# Patient Record
Sex: Female | Born: 1950 | Race: White | Hispanic: No | Smoking: Former smoker
Health system: Southern US, Community
[De-identification: ages and names within clinical notes are randomized; demographics above are authoritative.]

## PROBLEM LIST (undated history)

## (undated) DIAGNOSIS — I4891 Unspecified atrial fibrillation: Secondary | ICD-10-CM

## (undated) DIAGNOSIS — F419 Anxiety disorder, unspecified: Secondary | ICD-10-CM

## (undated) DIAGNOSIS — E0590 Thyrotoxicosis, unspecified without thyrotoxic crisis or storm: Secondary | ICD-10-CM

## (undated) DIAGNOSIS — Z8489 Family history of other specified conditions: Secondary | ICD-10-CM

## (undated) DIAGNOSIS — I10 Essential (primary) hypertension: Secondary | ICD-10-CM

## (undated) DIAGNOSIS — I739 Peripheral vascular disease, unspecified: Secondary | ICD-10-CM

## (undated) DIAGNOSIS — M069 Rheumatoid arthritis, unspecified: Secondary | ICD-10-CM

## (undated) HISTORY — DX: Anxiety disorder, unspecified: F41.9

## (undated) HISTORY — PX: CATARACT EXTRACTION: SUR2

## (undated) HISTORY — DX: Thyrotoxicosis, unspecified without thyrotoxic crisis or storm: E05.90

## (undated) HISTORY — DX: Peripheral vascular disease, unspecified: I73.9

## (undated) HISTORY — DX: Unspecified atrial fibrillation: I48.91

## (undated) HISTORY — PX: TUBAL LIGATION: SHX77

## (undated) HISTORY — PX: OTHER SURGICAL HISTORY: SHX169

## (undated) HISTORY — DX: Rheumatoid arthritis, unspecified: M06.9

## (undated) HISTORY — DX: Essential (primary) hypertension: I10

---

## 1997-10-24 ENCOUNTER — Emergency Department (HOSPITAL_COMMUNITY): Admission: EM | Admit: 1997-10-24 | Discharge: 1997-10-24 | Payer: Self-pay | Admitting: Emergency Medicine

## 1998-03-23 ENCOUNTER — Encounter: Admission: RE | Admit: 1998-03-23 | Discharge: 1998-06-21 | Payer: Self-pay | Admitting: Neurosurgery

## 2002-05-31 ENCOUNTER — Emergency Department (HOSPITAL_COMMUNITY): Admission: EM | Admit: 2002-05-31 | Discharge: 2002-05-31 | Payer: Self-pay | Admitting: Emergency Medicine

## 2002-10-15 ENCOUNTER — Emergency Department (HOSPITAL_COMMUNITY): Admission: EM | Admit: 2002-10-15 | Discharge: 2002-10-15 | Payer: Self-pay | Admitting: Emergency Medicine

## 2002-10-15 ENCOUNTER — Encounter: Payer: Self-pay | Admitting: Emergency Medicine

## 2002-10-19 ENCOUNTER — Ambulatory Visit (HOSPITAL_BASED_OUTPATIENT_CLINIC_OR_DEPARTMENT_OTHER): Admission: RE | Admit: 2002-10-19 | Discharge: 2002-10-19 | Payer: Self-pay | Admitting: Orthopedic Surgery

## 2004-08-20 ENCOUNTER — Emergency Department (HOSPITAL_COMMUNITY): Admission: EM | Admit: 2004-08-20 | Discharge: 2004-08-20 | Payer: Self-pay | Admitting: Emergency Medicine

## 2004-08-25 ENCOUNTER — Ambulatory Visit: Admission: RE | Admit: 2004-08-25 | Discharge: 2004-08-25 | Payer: Self-pay | Admitting: Emergency Medicine

## 2004-08-30 ENCOUNTER — Ambulatory Visit: Payer: Self-pay | Admitting: Family Medicine

## 2004-09-02 ENCOUNTER — Ambulatory Visit: Payer: Self-pay | Admitting: *Deleted

## 2004-09-04 ENCOUNTER — Ambulatory Visit: Payer: Self-pay | Admitting: Family Medicine

## 2004-09-13 ENCOUNTER — Ambulatory Visit: Payer: Self-pay | Admitting: Family Medicine

## 2004-09-18 ENCOUNTER — Ambulatory Visit (HOSPITAL_COMMUNITY): Admission: RE | Admit: 2004-09-18 | Discharge: 2004-09-18 | Payer: Self-pay | Admitting: Internal Medicine

## 2004-10-09 ENCOUNTER — Ambulatory Visit: Payer: Self-pay | Admitting: Family Medicine

## 2004-10-28 ENCOUNTER — Ambulatory Visit: Payer: Self-pay | Admitting: Gastroenterology

## 2005-02-19 ENCOUNTER — Ambulatory Visit: Payer: Self-pay | Admitting: Internal Medicine

## 2005-03-04 ENCOUNTER — Ambulatory Visit: Payer: Self-pay | Admitting: Family Medicine

## 2005-05-07 ENCOUNTER — Ambulatory Visit (HOSPITAL_COMMUNITY): Admission: RE | Admit: 2005-05-07 | Discharge: 2005-05-07 | Payer: Self-pay | Admitting: Internal Medicine

## 2005-05-07 ENCOUNTER — Ambulatory Visit: Payer: Self-pay | Admitting: Family Medicine

## 2005-05-21 ENCOUNTER — Ambulatory Visit: Payer: Self-pay | Admitting: Family Medicine

## 2005-06-10 ENCOUNTER — Ambulatory Visit: Payer: Self-pay | Admitting: Family Medicine

## 2005-07-31 ENCOUNTER — Ambulatory Visit: Payer: Self-pay | Admitting: Family Medicine

## 2005-09-22 ENCOUNTER — Ambulatory Visit: Payer: Self-pay | Admitting: Family Medicine

## 2006-04-29 ENCOUNTER — Ambulatory Visit: Payer: Self-pay | Admitting: Family Medicine

## 2006-08-13 ENCOUNTER — Emergency Department (HOSPITAL_COMMUNITY): Admission: EM | Admit: 2006-08-13 | Discharge: 2006-08-13 | Payer: Self-pay | Admitting: Emergency Medicine

## 2006-08-26 ENCOUNTER — Ambulatory Visit: Payer: Self-pay | Admitting: Family Medicine

## 2006-08-26 ENCOUNTER — Encounter (INDEPENDENT_AMBULATORY_CARE_PROVIDER_SITE_OTHER): Payer: Self-pay | Admitting: Family Medicine

## 2006-09-23 ENCOUNTER — Ambulatory Visit: Payer: Self-pay | Admitting: Family Medicine

## 2006-11-16 ENCOUNTER — Ambulatory Visit: Payer: Self-pay | Admitting: Family Medicine

## 2007-03-31 ENCOUNTER — Encounter (INDEPENDENT_AMBULATORY_CARE_PROVIDER_SITE_OTHER): Payer: Self-pay | Admitting: *Deleted

## 2008-05-03 ENCOUNTER — Ambulatory Visit: Payer: Self-pay | Admitting: Internal Medicine

## 2008-05-03 LAB — CONVERTED CEMR LAB
ALT: 15 units/L (ref 0–35)
AST: 15 units/L (ref 0–37)
Albumin: 4.4 g/dL (ref 3.5–5.2)
Alkaline Phosphatase: 113 units/L (ref 39–117)
BUN: 15 mg/dL (ref 6–23)
Basophils Absolute: 0.1 10*3/uL (ref 0.0–0.1)
Basophils Relative: 0 % (ref 0–1)
CO2: 24 meq/L (ref 19–32)
Calcium: 10.8 mg/dL — ABNORMAL HIGH (ref 8.4–10.5)
Chloride: 104 meq/L (ref 96–112)
Creatinine, Ser: 0.68 mg/dL (ref 0.40–1.20)
Eosinophils Absolute: 0.1 10*3/uL (ref 0.0–0.7)
Eosinophils Relative: 1 % (ref 0–5)
Glucose, Bld: 107 mg/dL — ABNORMAL HIGH (ref 70–99)
HCT: 40.6 % (ref 36.0–46.0)
Hemoglobin: 13.2 g/dL (ref 12.0–15.0)
Lymphocytes Relative: 7 % — ABNORMAL LOW (ref 12–46)
Lymphs Abs: 1 10*3/uL (ref 0.7–4.0)
MCHC: 32.5 g/dL (ref 30.0–36.0)
MCV: 98.1 fL (ref 78.0–100.0)
Monocytes Absolute: 0.4 10*3/uL (ref 0.1–1.0)
Monocytes Relative: 3 % (ref 3–12)
Neutro Abs: 11.9 10*3/uL — ABNORMAL HIGH (ref 1.7–7.7)
Neutrophils Relative %: 89 % — ABNORMAL HIGH (ref 43–77)
Platelets: 286 10*3/uL (ref 150–400)
Potassium: 4.2 meq/L (ref 3.5–5.3)
RBC: 4.14 M/uL (ref 3.87–5.11)
RDW: 14.2 % (ref 11.5–15.5)
Sed Rate: 35 mm/hr — ABNORMAL HIGH (ref 0–22)
Sodium: 139 meq/L (ref 135–145)
Total Bilirubin: 0.4 mg/dL (ref 0.3–1.2)
Total Protein: 7.6 g/dL (ref 6.0–8.3)
WBC: 13.4 10*3/uL — ABNORMAL HIGH (ref 4.0–10.5)

## 2008-05-15 ENCOUNTER — Ambulatory Visit (HOSPITAL_COMMUNITY): Admission: RE | Admit: 2008-05-15 | Discharge: 2008-05-15 | Payer: Self-pay | Admitting: Family Medicine

## 2008-09-20 ENCOUNTER — Ambulatory Visit: Payer: Self-pay | Admitting: Internal Medicine

## 2008-09-20 LAB — CONVERTED CEMR LAB
ALT: 10 units/L (ref 0–35)
AST: 11 units/L (ref 0–37)
Alkaline Phosphatase: 76 units/L (ref 39–117)
Band Neutrophils: 0 % (ref 0–10)
Basophils Absolute: 0 10*3/uL (ref 0.0–0.1)
Basophils Relative: 0 % (ref 0–1)
Calcium: 10.1 mg/dL (ref 8.4–10.5)
Creatinine, Ser: 0.81 mg/dL (ref 0.40–1.20)
Direct LDL: 117 mg/dL — ABNORMAL HIGH
Eosinophils Absolute: 0.1 10*3/uL (ref 0.0–0.7)
Eosinophils Relative: 1 % (ref 0–5)
Glucose, Bld: 100 mg/dL — ABNORMAL HIGH (ref 70–99)
Hemoglobin: 12.7 g/dL (ref 12.0–15.0)
Lymphocytes Relative: 25 % (ref 12–46)
Monocytes Absolute: 0.8 10*3/uL (ref 0.1–1.0)
Monocytes Relative: 8 % (ref 3–12)
Neutro Abs: 6.7 10*3/uL (ref 1.7–7.7)
Neutrophils Relative %: 65 % (ref 43–77)
Platelets: 312 10*3/uL (ref 150–400)
Potassium: 4.3 meq/L (ref 3.5–5.3)
RBC: 3.8 M/uL — ABNORMAL LOW (ref 3.87–5.11)
RDW: 14 % (ref 11.5–15.5)
Sodium: 141 meq/L (ref 135–145)
Total Bilirubin: 0.2 mg/dL — ABNORMAL LOW (ref 0.3–1.2)
Total Protein: 7.4 g/dL (ref 6.0–8.3)
WBC: 10.2 10*3/uL (ref 4.0–10.5)

## 2009-02-14 ENCOUNTER — Ambulatory Visit: Payer: Self-pay | Admitting: Internal Medicine

## 2009-03-28 ENCOUNTER — Ambulatory Visit: Payer: Self-pay | Admitting: Internal Medicine

## 2009-03-28 LAB — CONVERTED CEMR LAB
AST: 16 units/L (ref 0–37)
BUN: 15 mg/dL (ref 6–23)
Basophils Absolute: 0 10*3/uL (ref 0.0–0.1)
Basophils Relative: 0 % (ref 0–1)
Calcium: 10.5 mg/dL (ref 8.4–10.5)
Chloride: 104 meq/L (ref 96–112)
Creatinine, Ser: 0.81 mg/dL (ref 0.40–1.20)
Eosinophils Absolute: 0 10*3/uL (ref 0.0–0.7)
Eosinophils Relative: 0 % (ref 0–5)
HCT: 37.2 % (ref 36.0–46.0)
Lymphs Abs: 1.2 10*3/uL (ref 0.7–4.0)
MCHC: 33.6 g/dL (ref 30.0–36.0)
MCV: 100.8 fL — ABNORMAL HIGH (ref 78.0–100.0)
Platelets: 308 10*3/uL (ref 150–400)
RDW: 14.8 % (ref 11.5–15.5)

## 2009-03-31 ENCOUNTER — Ambulatory Visit (HOSPITAL_COMMUNITY): Admission: RE | Admit: 2009-03-31 | Discharge: 2009-03-31 | Payer: Self-pay | Admitting: Internal Medicine

## 2009-04-04 ENCOUNTER — Ambulatory Visit: Payer: Self-pay | Admitting: Internal Medicine

## 2009-05-17 ENCOUNTER — Ambulatory Visit (HOSPITAL_COMMUNITY): Admission: RE | Admit: 2009-05-17 | Discharge: 2009-05-17 | Payer: Self-pay | Admitting: Internal Medicine

## 2009-05-30 ENCOUNTER — Ambulatory Visit: Payer: Self-pay | Admitting: Internal Medicine

## 2009-08-29 ENCOUNTER — Ambulatory Visit: Payer: Self-pay | Admitting: Internal Medicine

## 2009-08-29 LAB — CONVERTED CEMR LAB
ALT: 47 units/L — ABNORMAL HIGH (ref 0–35)
Albumin: 4.7 g/dL (ref 3.5–5.2)
Alkaline Phosphatase: 92 units/L (ref 39–117)
BUN: 11 mg/dL (ref 6–23)
Basophils Absolute: 0.1 10*3/uL (ref 0.0–0.1)
Basophils Relative: 1 % (ref 0–1)
CO2: 23 meq/L (ref 19–32)
Calcium: 10.9 mg/dL — ABNORMAL HIGH (ref 8.4–10.5)
Chloride: 103 meq/L (ref 96–112)
Creatinine, Ser: 0.78 mg/dL (ref 0.40–1.20)
Eosinophils Absolute: 0.1 10*3/uL (ref 0.0–0.7)
Eosinophils Relative: 1 % (ref 0–5)
Glucose, Bld: 100 mg/dL — ABNORMAL HIGH (ref 70–99)
HCT: 41.7 % (ref 36.0–46.0)
Hemoglobin: 13.5 g/dL (ref 12.0–15.0)
Lymphocytes Relative: 13 % (ref 12–46)
Lymphs Abs: 1.3 10*3/uL (ref 0.7–4.0)
MCHC: 32.4 g/dL (ref 30.0–36.0)
MCV: 104 fL — ABNORMAL HIGH (ref 78.0–100.0)
Monocytes Absolute: 0.5 10*3/uL (ref 0.1–1.0)
Monocytes Relative: 5 % (ref 3–12)
Neutro Abs: 7.9 10*3/uL — ABNORMAL HIGH (ref 1.7–7.7)
Neutrophils Relative %: 81 % — ABNORMAL HIGH (ref 43–77)
Platelets: 251 10*3/uL (ref 150–400)
Potassium: 4.8 meq/L (ref 3.5–5.3)
RBC: 4.01 M/uL (ref 3.87–5.11)
RDW: 14.2 % (ref 11.5–15.5)
Sodium: 140 meq/L (ref 135–145)
Total Bilirubin: 0.3 mg/dL (ref 0.3–1.2)
Total Protein: 7.6 g/dL (ref 6.0–8.3)
WBC: 9.8 10*3/uL (ref 4.0–10.5)

## 2009-10-11 ENCOUNTER — Ambulatory Visit: Payer: Self-pay | Admitting: Internal Medicine

## 2009-10-11 LAB — CONVERTED CEMR LAB
ALT: 23 units/L (ref 0–35)
AST: 19 units/L (ref 0–37)
Albumin: 4.6 g/dL (ref 3.5–5.2)
Alkaline Phosphatase: 111 units/L (ref 39–117)
BUN: 9 mg/dL (ref 6–23)
Basophils Absolute: 0.1 10*3/uL (ref 0.0–0.1)
Basophils Relative: 1 % (ref 0–1)
CO2: 25 meq/L (ref 19–32)
Eosinophils Absolute: 0.2 10*3/uL (ref 0.0–0.7)
Glucose, Bld: 59 mg/dL — ABNORMAL LOW (ref 70–99)
Hemoglobin: 12.3 g/dL (ref 12.0–15.0)
Lymphocytes Relative: 26 % (ref 12–46)
MCHC: 32.7 g/dL (ref 30.0–36.0)
MCV: 104.7 fL — ABNORMAL HIGH (ref 78.0–100.0)
Monocytes Absolute: 0.9 10*3/uL (ref 0.1–1.0)
Monocytes Relative: 11 % (ref 3–12)
Neutro Abs: 5 10*3/uL (ref 1.7–7.7)
Potassium: 4 meq/L (ref 3.5–5.3)
RBC: 3.59 M/uL — ABNORMAL LOW (ref 3.87–5.11)
Sodium: 141 meq/L (ref 135–145)
Total Bilirubin: 0.3 mg/dL (ref 0.3–1.2)
Total Protein: 7.4 g/dL (ref 6.0–8.3)
WBC: 8.4 10*3/uL (ref 4.0–10.5)

## 2009-12-06 ENCOUNTER — Ambulatory Visit: Payer: Self-pay | Admitting: Internal Medicine

## 2010-03-05 ENCOUNTER — Ambulatory Visit: Payer: Self-pay | Admitting: Internal Medicine

## 2010-03-05 LAB — CONVERTED CEMR LAB
ALT: 19 units/L (ref 0–35)
AST: 22 units/L (ref 0–37)
Albumin: 4.4 g/dL (ref 3.5–5.2)
Alkaline Phosphatase: 81 units/L (ref 39–117)
BUN: 12 mg/dL (ref 6–23)
Basophils Absolute: 0 10*3/uL (ref 0.0–0.1)
Basophils Relative: 1 % (ref 0–1)
CO2: 28 meq/L (ref 19–32)
Chloride: 106 meq/L (ref 96–112)
Eosinophils Absolute: 0.1 10*3/uL (ref 0.0–0.7)
Glucose, Bld: 95 mg/dL (ref 70–99)
Lymphocytes Relative: 19 % (ref 12–46)
Lymphs Abs: 1.3 10*3/uL (ref 0.7–4.0)
MCHC: 32.9 g/dL (ref 30.0–36.0)
Monocytes Relative: 9 % (ref 3–12)
Neutro Abs: 4.6 10*3/uL (ref 1.7–7.7)
Neutrophils Relative %: 69 % (ref 43–77)
Platelets: 208 10*3/uL (ref 150–400)
Potassium: 4.1 meq/L (ref 3.5–5.3)
RBC: 3.1 M/uL — ABNORMAL LOW (ref 3.87–5.11)
Sed Rate: 20 mm/hr (ref 0–22)
Sodium: 142 meq/L (ref 135–145)
Total Bilirubin: 0.3 mg/dL (ref 0.3–1.2)
Total Protein: 6.8 g/dL (ref 6.0–8.3)

## 2010-06-28 ENCOUNTER — Encounter (INDEPENDENT_AMBULATORY_CARE_PROVIDER_SITE_OTHER): Payer: Self-pay | Admitting: Internal Medicine

## 2010-06-28 LAB — CONVERTED CEMR LAB
ALT: 22 units/L (ref 0–35)
AST: 23 units/L (ref 0–37)
Albumin: 4.6 g/dL (ref 3.5–5.2)
Alkaline Phosphatase: 68 units/L (ref 39–117)
BUN: 10 mg/dL (ref 6–23)
CO2: 22 meq/L (ref 19–32)
Calcium: 10.5 mg/dL (ref 8.4–10.5)
Chloride: 104 meq/L (ref 96–112)
Creatinine, Ser: 0.75 mg/dL (ref 0.40–1.20)
Folate: >20 ng/mL
Glucose, Bld: 109 mg/dL — ABNORMAL HIGH (ref 70–99)
HCT: 35.7 % — ABNORMAL LOW (ref 36.0–46.0)
Hemoglobin: 12.2 g/dL (ref 12.0–15.0)
MCHC: 34.2 g/dL (ref 30.0–36.0)
MCV: 103.5 fL — ABNORMAL HIGH (ref 78.0–100.0)
Platelets: 286 10*3/uL (ref 150–400)
Potassium: 3.9 meq/L (ref 3.5–5.3)
RBC: 3.45 M/uL — ABNORMAL LOW (ref 3.87–5.11)
RDW: 18.4 % — ABNORMAL HIGH (ref 11.5–15.5)
Sodium: 139 meq/L (ref 135–145)
Total Bilirubin: 0.3 mg/dL (ref 0.3–1.2)
Total Protein: 7.6 g/dL (ref 6.0–8.3)
Vitamin B-12: 666 pg/mL (ref 211–911)
WBC: 7.2 10*3/uL (ref 4.0–10.5)

## 2010-11-05 ENCOUNTER — Inpatient Hospital Stay (HOSPITAL_COMMUNITY)
Admission: EM | Admit: 2010-11-05 | Discharge: 2010-11-08 | DRG: 310 | Disposition: A | Discharge status: Home / Self Care | Payer: Medicaid Other | Attending: Internal Medicine | Admitting: Internal Medicine

## 2010-11-05 ENCOUNTER — Emergency Department (HOSPITAL_COMMUNITY): Payer: Medicaid Other

## 2010-11-05 DIAGNOSIS — E0789 Other specified disorders of thyroid: Secondary | ICD-10-CM | POA: Diagnosis present

## 2010-11-05 DIAGNOSIS — K2900 Acute gastritis without bleeding: Secondary | ICD-10-CM | POA: Diagnosis present

## 2010-11-05 DIAGNOSIS — F411 Generalized anxiety disorder: Secondary | ICD-10-CM | POA: Diagnosis present

## 2010-11-05 DIAGNOSIS — M069 Rheumatoid arthritis, unspecified: Secondary | ICD-10-CM | POA: Diagnosis present

## 2010-11-05 DIAGNOSIS — K029 Dental caries, unspecified: Secondary | ICD-10-CM | POA: Diagnosis present

## 2010-11-05 DIAGNOSIS — I4891 Unspecified atrial fibrillation: Secondary | ICD-10-CM

## 2010-11-05 DIAGNOSIS — I959 Hypotension, unspecified: Secondary | ICD-10-CM | POA: Diagnosis present

## 2010-11-05 DIAGNOSIS — K089 Disorder of teeth and supporting structures, unspecified: Secondary | ICD-10-CM | POA: Diagnosis present

## 2010-11-05 DIAGNOSIS — E860 Dehydration: Secondary | ICD-10-CM | POA: Diagnosis present

## 2010-11-05 LAB — DIFFERENTIAL
Basophils Absolute: 0 10*3/uL (ref 0.0–0.1)
Eosinophils Absolute: 0.1 10*3/uL (ref 0.0–0.7)
Eosinophils Relative: 1 % (ref 0–5)
Lymphocytes Relative: 14 % (ref 12–46)
Lymphs Abs: 1.3 10*3/uL (ref 0.7–4.0)
Monocytes Relative: 7 % (ref 3–12)
Neutrophils Relative %: 78 % — ABNORMAL HIGH (ref 43–77)

## 2010-11-05 LAB — CBC
HCT: 35.2 % — ABNORMAL LOW (ref 36.0–46.0)
Hemoglobin: 12.3 g/dL (ref 12.0–15.0)
MCH: 32.8 pg (ref 26.0–34.0)
MCHC: 34.9 g/dL (ref 30.0–36.0)
MCV: 93.9 fL (ref 78.0–100.0)
Platelets: 230 10*3/uL (ref 150–400)
RBC: 3.75 MIL/uL — ABNORMAL LOW (ref 3.87–5.11)
RDW: 12.4 % (ref 11.5–15.5)
WBC: 9 10*3/uL (ref 4.0–10.5)

## 2010-11-05 LAB — URINALYSIS, ROUTINE W REFLEX MICROSCOPIC
Bilirubin Urine: NEGATIVE
Glucose, UA: 100 mg/dL — AB
Hgb urine dipstick: NEGATIVE
Ketones, ur: NEGATIVE mg/dL
Nitrite: NEGATIVE
Protein, ur: NEGATIVE mg/dL
Specific Gravity, Urine: 1.015 (ref 1.005–1.030)
Urobilinogen, UA: 0.2 mg/dL (ref 0.0–1.0)

## 2010-11-05 LAB — COMPREHENSIVE METABOLIC PANEL
ALT: 15 U/L (ref 0–35)
Alkaline Phosphatase: 77 U/L (ref 39–117)
BUN: 16 mg/dL (ref 6–23)
Calcium: 10.1 mg/dL (ref 8.4–10.5)
Chloride: 102 mEq/L (ref 96–112)
Creatinine, Ser: 0.86 mg/dL (ref 0.4–1.2)
GFR calc Af Amer: >60 mL/min (ref >60)
GFR calc non Af Amer: >60 mL/min (ref >60)
Glucose, Bld: 149 mg/dL — ABNORMAL HIGH (ref 70–99)
Potassium: 3.1 mEq/L — ABNORMAL LOW (ref 3.5–5.1)
Sodium: 137 mEq/L (ref 135–145)
Total Bilirubin: 0.5 mg/dL (ref 0.3–1.2)
Total Protein: 6.8 g/dL (ref 6.0–8.3)

## 2010-11-05 LAB — POCT CARDIAC MARKERS
CKMB, poc: 1.4 ng/mL (ref 1.0–8.0)
Myoglobin, poc: 96.1 ng/mL (ref 12–200)
Troponin i, poc: <0.05 ng/mL (ref 0.00–0.09)

## 2010-11-05 LAB — CARDIAC PANEL(CRET KIN+CKTOT+MB+TROPI)
CK, MB: 1.4 ng/mL (ref 0.3–4.0)
Relative Index: INVALID (ref 0.0–2.5)
Total CK: 56 U/L (ref 7–177)

## 2010-11-05 LAB — LIPASE, BLOOD: Lipase: 24 U/L (ref 11–59)

## 2010-11-05 LAB — MAGNESIUM: Magnesium: 1.7 mg/dL (ref 1.5–2.5)

## 2010-11-06 ENCOUNTER — Inpatient Hospital Stay (HOSPITAL_COMMUNITY): Payer: Medicaid Other

## 2010-11-06 DIAGNOSIS — R072 Precordial pain: Secondary | ICD-10-CM

## 2010-11-06 LAB — T3: T3, Total: 152.7 ng/dl (ref 80.0–204.0)

## 2010-11-06 LAB — HEMOGLOBIN A1C: Mean Plasma Glucose: 111 mg/dL (ref <117)

## 2010-11-06 LAB — LIPASE, BLOOD: Lipase: 28 U/L (ref 11–59)

## 2010-11-06 LAB — CORTISOL-AM, BLOOD: Cortisol - AM: 1.1 ug/dL — ABNORMAL LOW (ref 4.3–22.4)

## 2010-11-06 LAB — BASIC METABOLIC PANEL
BUN: 8 mg/dL (ref 6–23)
CO2: 22 mEq/L (ref 19–32)
Chloride: 113 mEq/L — ABNORMAL HIGH (ref 96–112)
Potassium: 4.1 mEq/L (ref 3.5–5.1)

## 2010-11-06 LAB — CARDIAC PANEL(CRET KIN+CKTOT+MB+TROPI)
CK, MB: 0.9 ng/mL (ref 0.3–4.0)
Troponin I: <0.01 ng/mL (ref 0.00–0.06)

## 2010-11-06 LAB — TSH: TSH: 0.087 u[IU]/mL — ABNORMAL LOW (ref 0.350–4.500)

## 2010-11-06 LAB — T3 UPTAKE: T3 Uptake Ratio: 39.1 % — ABNORMAL HIGH (ref 22.5–37.0)

## 2010-11-07 ENCOUNTER — Inpatient Hospital Stay (HOSPITAL_COMMUNITY): Payer: Medicaid Other

## 2010-11-07 DIAGNOSIS — I4891 Unspecified atrial fibrillation: Secondary | ICD-10-CM

## 2010-11-07 LAB — BASIC METABOLIC PANEL
BUN: 11 mg/dL (ref 6–23)
Calcium: 9.2 mg/dL (ref 8.4–10.5)
Creatinine, Ser: 0.95 mg/dL (ref 0.4–1.2)
GFR calc Af Amer: >60 mL/min (ref >60)
GFR calc non Af Amer: 60 mL/min — ABNORMAL LOW (ref >60)

## 2010-11-07 LAB — CBC
MCH: 31.9 pg (ref 26.0–34.0)
MCV: 95.9 fL (ref 78.0–100.0)
Platelets: 177 10*3/uL (ref 150–400)
RDW: 12.6 % (ref 11.5–15.5)

## 2010-11-08 ENCOUNTER — Inpatient Hospital Stay (HOSPITAL_COMMUNITY): Payer: Medicaid Other

## 2010-11-08 LAB — BASIC METABOLIC PANEL
BUN: 11 mg/dL (ref 6–23)
CO2: 28 mEq/L (ref 19–32)
Calcium: 9.4 mg/dL (ref 8.4–10.5)
Creatinine, Ser: 0.83 mg/dL (ref 0.4–1.2)
Glucose, Bld: 99 mg/dL (ref 70–99)

## 2010-11-08 LAB — ACTH STIMULATION, 3 TIME POINTS: Cortisol, Base: 16.8 ug/dL

## 2010-11-08 NOTE — Consult Note (Signed)
NAMEMERLE, WHITEHORN            ACCOUNT NO.:  [id number]  MEDICAL RECORD NO.:  [id number]           PATIENT TYPE:  E  LOCATION:  WLED                         FACILITY:  [facility]  PHYSICIAN:  Colleen Can. Deborah Chalk, M.D.DATE OF BIRTH:  October 08, 1950  DATE OF CONSULTATION: DATE OF DISCHARGE:                                CONSULTATION   We were asked to see Ms. Nulty for evaluation of atrial fibrillation. She presented to the emergency room for evaluation of abdominal pain, atrial fibrillation, and syncope occurring 24 hours earlier.  She is a 60 year old female with atrial fibrillation of unknown duration.  She had syncope last night, but all of this is clouded by a period of time with poor eating habits, GI distress, and moving and trying to unload multiple boxes and furniture in her house.  She has had 10 to 14-day history of GI discomfort with a burning type of indigestion.  She has had poor appetite for approximately 2 weeks.  Approximately 4 weeks ago, she began feeling somewhat weak and was eating poorly.  She told that the total symptoms started initially with a toothache and anorexia.  She gradually got better, was able to eat an Easter meal, but then has remained sick for last 2 weeks.  She has a history of rheumatoid arthritis, was on chronic prednisone, but apparently stopped 60 weeks ago by her new primary care medical doctor.  She has a history of chronic back pain.  There is a questionable history of ankylosing spondylitis and one son apparently has Reiter's disease, but she really could not confirmed the presence of ankylosing spondylitis.  She is on chronic Vicodin for back pain.  PAST MEDICAL HISTORY:  There is no surgeries, but she has had a right wrist pain because of  the fracture.  She does give a history of bloody diarrhea about 8 years ago that apparently resolved.  She does smoke nine cigarettes a day.  She does not currently drink alcohol, but there is a  questionable history from a daughter that she drank heavily in the past.  FAMILY HISTORY:  Mother died at age 47 of heart disease.  Father died at age 74 of cancer.  One brother has history of lung cancer, but is a nonsmoker.  One brother had history of rheumatoid arthritis.  One son has a history of Reiter's disease.  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  REVIEW OF SYSTEMS:  She is chronically anxious.  Most of the symptoms are as noted above.  She has really not been feeling well for the last month in particular.  She has a history of chronic palpitations and she describes irregular heartbeat is running in the family, being something chronic for her.  PHYSICAL EXAMINATION:  VITAL SIGNS:  Her blood pressure is 110/72, heart rate 88, irregular. HEENT:  She is essentially negative.  Oropharynx was not examined. LUNGS:  Coarse. HEART:  Shows irregular rate and rhythm. ABDOMEN:  Soft, nontender.  Normal bowel sounds.  No masses. EXTREMITIES:  Without edema.  Peripheral pulses are  present. NEUROLOGICAL:  She is intact. SKIN:  Warm and dry.  There is no icterus.  LAB:  White counts 9000, hematocrit 35.2, platelets are 230,000.  Her potassium is 3.1, sodium is 137, chloride is 102, CO2 is 24, BUN 16, creatinine 0.86.  Abdominal ultrasound was negative.  EKG is still pending, not present in the emergency room.  IMPRESSION: 1. Atrial fibrillation of questionable duration. 2. Prednisone withdrawal, question Addisonian situation. 3. History of rheumatoid arthritis. 4. Questionable  human leukocyte antigen-B27 related arthritis. 5. Gastrointestinal discomfort. 6. Questionable Gastrointestinal bleed.  PLAN: 1. We will check AM cortisol and consider doing a Cortrosyn     stimulation test. 2. We will control the ventricular rate of atrial fibrillation with     Cardizem.  We may add digoxin after  the echocardiogram. 3. We will get a 2-D echocardiogram. 4. Replace potassium. 5. Rule out myocardial infarction. 6. Proton pump inhibitor for GI distress. 7. We will hold off on anticoagulation for 24-48 hours.  We will stabilize with possible consideration for warfarin for 3 or 4 weeks and then consideration of cardioversion.  Her Italy score is low and we could consider rate control without Coumadin, but I think that she is a candidate for cardioversion and we need to anticoagulate her prior to that event.  Certainly seems like there is something else going on with her from a GI standpoint.  She does have significant rheumatoid arthritis with significant history of prednisone use and she has had poor medical followup apparently.     Colleen Can. Deborah Chalk, M.D.     SNT/MEDQ  D:  11/05/2010  T:  11/06/2010  Job:  [id number]  Electronically Signed by Roger Shelter M.D. on 11/08/2010 03:53:20 PM

## 2010-11-09 NOTE — Discharge Summary (Signed)
NAMEGERMANI, Rhonda Marshall            ACCOUNT NO.:  [id number]  MEDICAL RECORD NO.:  [id number]           PATIENT TYPE:  I  LOCATION:  1427                         FACILITY:  [facility]  PHYSICIAN:  Isidor Holts, M.D.  DATE OF BIRTH:  01/08/1951  DATE OF ADMISSION:  11/05/2010 DATE OF DISCHARGE:  11/08/2010                              DISCHARGE SUMMARY   PRIMARY M.D.:  Previously [facility]; however, has an appointment with Dr. Aida Puffer, telephone number [phone] on Nov 19, 2010 at 1:00 p.m. at [place], [place].  DISCHARGE DIAGNOSES: 1. Newly diagnosed paroxysmal atrial fibrillation. 2. History of rheumatoid arthritis. 3. Subclinical hyperthyroidism. 4. Self-limited acute viral gastritis. 5. Dental caries. 6. Transient volume depletion/hypotension secondary to gastritis. 7. Anxiety.  DISCHARGE MEDICATIONS: 1. Amiodarone 200 mg p.o. b.i.d. 2. Aspirin 81 mg p.o. daily. 3. Clindamycin 150 mg p.o. q.i.d. for 4 days only. 4. Vicodin (5/325) one tablet p.o. p.r.n. q.6h. for pain, a total of     60 pills have been dispensed. 5. Aleve 220 mg 2 tablets p.o. p.r.n. q.8h. for pain. 6. Calcium with vitamin D 1 tablet p.o. daily. 7. Vitamin B12 1000 mcg p.o. daily.  PROCEDURES: 1. Abdominal ultrasound scan on November 05, 2010.  This was a negative     ultrasound examination. 2. Chest x-ray on November 05, 2010.  This showed no evidence of acute     cardiac or pulmonary process. 3. Chest x-ray on November 06, 2010.  This showed no significant change.     There were hyperinflated lungs. 4. X-ray left hand and wrist on November 06, 2010.  This showed no     evidence of acute fracture or dislocation.  There was osteopenia. 5. Orthopantogram done April on 27, 2012, report was still pending at     the time of this dictation. 6. The 2-D echocardiogram on November 06, 2010.  This showed diastolic     dysfunction, left ventricular cavity size was normal.  Wall     thickness was normal.   Estimated ejection fraction was 65%.  There     were no regional wall motion abnormalities.  There was mild mitral     valve regurgitation and trivial tricuspid valve regurgitation.     Pulmonary artery peak pressure was 31 mmHg.  CONSULTATIONS:  Colleen Can. Deborah Chalk, M.D., cardiologist.  ADMISSION HISTORY:  As in H and P notes of November 05, 2010 dictated by Dr. Pleas Koch. However, in brief, this is a 60 year old female, with known history of rheumatoid arthritis, status post bilateral tubal ligation, history of hardware right arm, status post ORIF of displaced intra-articular fracture distal right radius in April 2004, presenting with a few days of abdominal discomfort, heartburn, intermittent nausea and vomiting and also 20-day history of severe fatigue.  On detailed questioning, it appears that she does have a history of anxiety and has had episodes of palpitations, which occur on exertion.  On initial evaluation in the Emergency Department, she was found to have a blood pressure of 110/72 mmHg.  Heart rate was initially in the 80s; however, while in the Emergency Department, she went into atrial fibrillation with heart  rate in the 140s necessitating starting patient on a Cardizem drip.  She was admitted to the step-down unit for further evaluation, investigation and management.  CLINICAL COURSE: 1. Paroxysmal atrial fibrillation:  Patient presented as described     above.  She has known history of previous atrial fibrillation.     Initially, she was managed with intravenous infusion of Cardizem;     however, this was discontinued because of hypotension.  She was     transitioned to oral Cardizem, but indeed this was discontinued     because of persisting systolic blood pressure in the 90s.  After     discussion with cardiologist, she was commenced on amiodarone with     resolution of hypotension.  2. Acute gastritis:  Patient had presented with abdominal pain and     episodes of  vomiting and nausea.  There was no clustering of cases.     She was managed on proton pump inhibitor, antiemetics.  By day #2     of hospitalization, she had no further symptoms referable to GI     tract and had no recurrence of vomiting, therefore, likely this was     self-limited acute viral gastritis.  3. Dehydration/volume depletion:  This may have been in part,     contributory to patient's hypotension, against background of     Cardizem treatment for atrial fibrillation.  She was managed with     intravenous infusion of normal saline and did receive intermittent     boluses, with solution of hypotension.  We were on November 06, 2010     able to discontinue intravenous fluids, without any petrous     consequences.  Blood pressure has remained stable ever since.  4. Dysthyroidism/subclinical hyperthyroidism:  As part of workup for     patient's atrial fibrillation, she underwent thyroid function     testing, which showed a TSH of 0.087.  T4 was 8.2.  T3 was 152.7.     These by clinical findings were consistent with subclinical     hyperthyroidism.  Patient will need to follow-up in due course, with     her thyroid tests.  We shall defer this to patient's primary MD,     although subsequently, an endocrinology referral may be indicated.  5. Dental caries:  Patient presented with 20-day history of toothache.     She was managed with analgesics as well as clindamycin with     satisfactory clinical response.  As of November 08, 2010, she had no     more toothache.  She does have dental caries and will need     to have an outpatient appointment with a dentist, for definitive     management.  6. History of anxiety:  This did not prove problematic, although     patient did require occasional anxiolytic medications.  DISPOSITION:  Patient was on November 08, 2010 considered clinically stable for discharge.  A.m. cortisol level, which was done on suspicion of hypoadrenalism showed a serum  cortisol of 1.1.  To further evaluate this, patient underwent short Cortrosyn stimulation test on November 07, 2010, which showed the following findings:  Baseline cortisol 16.8 per 30 minutes cortisol, 25.3 and 60-minute cortisol, 28.4, effectively ruling out hypocortisolism.  Patient has been reassured accordingly. Patient was considered clinically stable for discharge on November 08, 2010.  She was therefore discharged accordingly.  ACTIVITY:  As tolerated.  Recommended to increase activity slowly.  DIET:  Heart-healthy.  FOLLOWUP  INSTRUCTIONS:  Patient is to follow up with her primary M.D., Dr. Aida Puffer, telephone number [phone].  An appointment has been scheduled for Nov 19, 2010 for 1:00 p.m.  In addition, patient to follow up with Dr. Charlton Haws, telephone number [phone].  An appointment has been scheduled for Nov 14, 2010 at 9:30 a.m.Marland Kitchen  Patient might benefit from endocrinology referral, though we shall defer this to her primary MD.  She has been supplied with the details for Dr. Tonita Cong office, telephone number [phone].  In addition, patient is recommended to make a dental appointment for definitive management of her dental caries.  All this has been communicated to the patient's family.  They verbalized understanding.     Isidor Holts, M.D.     CO/MEDQ  D:  11/08/2010  T:  11/08/2010  Job:  [id number]  cc:   Aida Puffer Fax: [phone]  Noralyn Pick. Eden Emms, MD, [facility] [address]  [address] [place] [address]  Tonita Cong, M.D. Fax: [phone]  Electronically Signed by Isidor Holts M.D. on 11/09/2010 04:22:37 PM

## 2010-11-12 ENCOUNTER — Encounter: Payer: Self-pay | Admitting: Cardiovascular Disease

## 2010-11-12 NOTE — H&P (Signed)
NAMELITTLE, Rhonda NO.:  [id number]  MEDICAL RECORD NO.:  [id number]           PATIENT TYPE:  E  LOCATION:  WLED                         FACILITY:  [facility]  PHYSICIAN:  Pleas Koch, MD        DATE OF BIRTH:  06-28-1951  DATE OF ADMISSION:  11/05/2010 DATE OF DISCHARGE:                             HISTORY & PHYSICAL   The patient sees [facility] but is in process of transferring care to [facility].  CHIEF COMPLAINT: 1. Abdominal pain and weakness. 2. Found to be in paroxysmal AFIB. 3. Toothache 4. Anxiety.  This is very pleasant Caucasian female with unknown medical illnesses in the past, who presented to the ED initially with history of abdominal pain and GERD-like symptoms.  The patient says she has had intermittent nausea, vomiting with radiation of pain to the back and told ED physician that this was related to eating.  No fever, chills with some clear emesis.  No blood or bilious BMs and few loose stools.  She goes on to state that around [place] they had a big cookout, and she thinks that is when some of these issues started.  She denies any dysphagia to any specific type of food but says she has no appetite.  She states that she also had a severe toothache and could only eat water and crackers the week prior to this, which was probably about 20 days ago.  She has been taking Pepto-Bismol with mild relief.  She took Xanax also to help with some anxiety that she had.  The patient does work at a [employer], environmental with clothes and states that occasionally when she has been moving around doing stuff, she has noticed her heart beat go up really fast and felt fatigued and then felt sort of a refluxing-type pain into her lower chest, which she attributed to gastroesophageal reflux and had to stop.  This morning, she woke at 6:30 in the morning, got up to make a sandwich to go to work and fainted.  This was heard by her son who hollered at her, and she  came back to.  She states that she has had on and off episodes of this chest pain with fast heartbeat but has never had heart history in the past.  She goes on to relate again that this has happened may be at work when doing excessive activity, and she develops lower abdominal pain in addition to the chest pain.  She denies any diarrhea, any blood in the stool, but this morning when she got up, she felt pressure in chest and she was trying to burp and could not get the sensation to go away.  PAST MEDICAL HISTORY:  The patient denies any blood pressure, any diabetes and cholesterol.  She states she has possible rheumatoid arthritis for which she had been on prednisone in the past; however, is not on that or Aleve or Vicodin at this present time.  She does suffer from lower back pain and has been told she has a torn muscle in her back.  PAST SURGICAL HISTORY:  She has a plate in her right arm.  She had bilateral tubal ligation and fell as a child.  She still has her gallbladder and appendix.  She states that she has been told she has a murmur in her heart.  FAMILY HISTORY:  Her mother had a heart attack in her late 40s and had a pacemaker in past at 28.  Her husband died of a heart attack at age 51. There is no history of dialysis in her family.  Her brother had diabetes mellitus.  Her father had history of colon and prostate cancer and died at the age of 76.  SOCIAL HISTORY:  She lives at home with one son.  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  She smoked recently about 2 packs every 5 days but is a chronic smoker and does drink rarely.  ALLERGIES:  She has no known drug allergies.  PHYSICAL EXAMINATION:  VITAL SIGNS:  On admission, temperature 97.8, blood pressure 110/72 sitting and pulse is in the 80s.  It was noted that she went into paroxysmal AFIB into the 140s, which was sustained, and the  patient was started on a Cardizem drip with 10 mg IV bolus and then at rate of 5.  She was satting 100% on room air. GENERAL:  Relatively anxious, frail, slightly built, well-nourished Caucasian female in no apparent distress.  No pallor.  No icterus. HEENT:  Funduscopy is noncontributory bilaterally.  I do not appreciate any arcus or pallor.  She has very poor dentition to both her molars at the back and has very few teeth. NECK:  I do appreciate some elevation of JVD at 30 degrees. CARDIAC:  S1, S2 rapid and irregular.  I do not appreciate any murmurs at this time.  PMI is nondisplaced. ABDOMEN:  Soft, nontender, nondistended.  No rebound, no guarding. Bowel sounds are slightly decreased. RESPIRATORY:  Her chest is clinically clear.  There is no added sound. EXTREMITIES:  Soft, nontender, nondistended.  No swelling.  LABORATORY DATA:  CBC shows hemoglobin 12.3, WBC 9.0, platelet count 230, hematocrit 35.2.  CMP shows potassium 3.1.  Sodium is 137, glucose 149, BUN 16, creatinine 0.86, bilirubin 0.5, alk phos 77, AST 20, ALT 15, albumin 3.2 which is low.  Lipase was 24.  Urinalysis was negative.  Cardiac markers point-of-care CK-MB is 1.4, troponin less than 0.05, myoglobin 96.1, magnesium 1.7, phosphorus was 3.0.  IMAGING: 1. Ultrasound of abdomen showed negative abdominal ultrasound. 2. Chest x-ray, 1 view, portable with no evidence of acute cardiac or     pulmonary process. 3. EKG initially showed normal sinus rhythm with possible left atrial     enlargement.  Repeat EKG confirmed AFIB and on monitor she is in     AFIB rate control of around 100, PR interval is 0.12, QT is less     than half of RR.  There is good R-wave progression.  I do not     appreciate any T-wave inversion.  ASSESSMENT/PLAN: 1. Cardiac, atrial fibrillation.  She has a Italy score of 1 and this     would preclude her from being on Coumadin.  She may benefit from     being on aspirin.  She has been started  on diltiazem in the ED.  We     will transition to p.o. Cardizem 30 mg q.6 h. x24 hours and then     controlled release.  We will consult cardiologist on call.  ED  physician has been gracious enough to contact them.  The patient     may need digoxin or negative ionotropic medication if she remains     hypotensive.  We have already gotten magnesium phosphate which     rules out this being secondary to electrolyte abnormalities.  We     will get a TSH as well.  Hopefully, we will be able to determine     the cause of this.  This may very well be likely secondary to     either her chronic tooth infection or gastroenteritis which is     persisting. 2. Hypotension.  The patient will be kept on IV fluids 175 cc/hour.     We will continue to monitor her blood pressures and as such, the     patient will need step-down care this point. 3. Questionable abdominal pain versus chest pain.  This sounds like an     anginal equivalent.  As such, we will rule her out by cardiac     enzymes x3 and EKG in the a.m. 4. Abdominal pain.  The patient has had an ultrasound which is     negative as well as acute abdominal series which is negative.  We     will hold off on for further imaging but certainly we will revisit     and CT scan if need be. 5. Toothache.  We will start her on either clindamycin or Augmentin to     control this and if needed in hospital, we will get a dental     consult. 6. Anxiety.  We have placed her on Xanax 0.25-0.5 q. 12 hourly. 7. Hypokalemia.  We will replace IV. 8. The patient will be admitted to [facility].  It was a pleasure taking care of this patient.  I have discussed the plan of care with the patient and the patient's 3 sons.  The patient is a full DNR.          ______________________________ Pleas Koch, MD     JS/MEDQ  D:  11/05/2010  T:  11/05/2010  Job:  [id number]  Electronically Signed by Pleas Koch MD on 11/12/2010 12:35:03 PM

## 2010-11-14 ENCOUNTER — Ambulatory Visit (INDEPENDENT_AMBULATORY_CARE_PROVIDER_SITE_OTHER): Payer: Self-pay | Admitting: Cardiovascular Disease

## 2010-11-14 ENCOUNTER — Encounter: Payer: Self-pay | Admitting: Cardiovascular Disease

## 2010-11-14 DIAGNOSIS — I480 Paroxysmal atrial fibrillation: Secondary | ICD-10-CM | POA: Insufficient documentation

## 2010-11-14 DIAGNOSIS — K219 Gastro-esophageal reflux disease without esophagitis: Secondary | ICD-10-CM

## 2010-11-14 DIAGNOSIS — I4811 Longstanding persistent atrial fibrillation: Secondary | ICD-10-CM | POA: Insufficient documentation

## 2010-11-14 DIAGNOSIS — I4891 Unspecified atrial fibrillation: Secondary | ICD-10-CM

## 2010-11-14 DIAGNOSIS — E0590 Thyrotoxicosis, unspecified without thyrotoxic crisis or storm: Secondary | ICD-10-CM

## 2010-11-14 NOTE — Patient Instructions (Addendum)
Your physician recommends that you schedule a follow-up appointment in: 3  Months with DR [facility]  Your physician recommends that you continue on your current medications as directed. Please refer to the Current Medication list given to you today.  STOP AMIOADARONE

## 2010-11-14 NOTE — Progress Notes (Signed)
60 yo patient of Dr Deborah Chalk.  Just hospitalized with dehydration, gastritis, subclinical hyperthyroidism.  Has not F/U with Dr Sharl Ma or Little yet.  Reviewed Dr Angelina Pih consult note from [facility] 4/25  Echo with normal EF and mild MR.  Transient afib that converted with amiodarone.  No palpiatations, dyspnea or syncope since D/C.  Reviewed ECG and telemetry.  PAF 36 hrs and NSR thereafter.  Given ? Of subclinical hyperthyroidism and long term side effects of amiodarone will stop this medicine for now.  Continues to have gastritis and servere reflux.  Taking over the counter nexium.  Has F/U with Dr Clarene Duke next week and instructed to get perscription H2 blocker  Reveiwed lab work and cortisol stim test normal, CPK's normal, TSH suprressed at .086 with normal total T4.  Clinically not hyerthyroid  Has F/U Dr Sharl Ma  Reviewed hospital  Consult Dr Deborah Chalk Lab Work Telemetry/ECG x2 Echo  ROS: Denies fever, malais, weight loss, blurry vision, decreased visual acuity, cough, sputum, SOB, hemoptysis, pleuritic pain, palpitaitons, heartburn, abdominal pain, melena, lower extremity edema, claudication, or rash.   General: Affect appropriate Healthy:  appears stated age HEENT: normal Neck supple with no adenopathy JVP normal no bruits no thyromegaly Lungs clear with no wheezing and good diaphragmatic motion Heart:  S1/S2 no murmur,rub, gallop or click PMI normal Abdomen: benighn, BS positve, no tenderness, no AAA no bruit.  No HSM or HJR Distal pulses intact with no bruits No edema Neuro non-focal Skin warm and dry No muscular weakness   Current Outpatient Prescriptions  Medication Sig Dispense Refill  . aspirin 81 MG tablet Take 81 mg by mouth daily.        . Calcium Carbonate-Vitamin D (CALCIUM + D PO) Take 1 capsule by mouth daily.        Marland Kitchen HYDROcodone-acetaminophen (VICODIN) 5-500 MG per tablet Take 1 tablet by mouth every 6 (six) hours as needed.        . vitamin B-12 (CYANOCOBALAMIN) 1000 MCG  tablet Take 1,000 mcg by mouth daily.        Marland Kitchen DISCONTD: amiodarone (PACERONE) 200 MG tablet Take 200 mg by mouth 2 (two) times daily.        Marland Kitchen DISCONTD: HYDROcodone-acetaminophen (NORCO) 5-325 MG per tablet Take 1 tablet by mouth every 6 (six) hours as needed.        Marland Kitchen DISCONTD: naproxen sodium (ANAPROX) 220 MG tablet Take 220 mg by mouth 2 (two) times daily with a meal.          Allergies  Clindamycin/lincomycin  Electrocardiogram:  11/05/10 NSR 90 normal ECG possible LAE  Assessment and Plan

## 2010-11-14 NOTE — Assessment & Plan Note (Signed)
Continue otc nexium  F/U Dr Clarene Duke next week and consider GI referral

## 2010-11-14 NOTE — Assessment & Plan Note (Signed)
In NSR  No symptoms referrable to heart  Normal echo.  F/U in 3 months.  D/C amiodarone given thyroid issues

## 2010-11-14 NOTE — Assessment & Plan Note (Signed)
F/U Dr Sharl Ma.  Consider beta blocker in futre now that amiodarone D/C

## 2010-11-29 NOTE — H&P (Signed)
NAME:  Rhonda Marshall, Rhonda Marshall                      ACCOUNT NO.:  [id number]   MEDICAL RECORD NO.:  [id number]                   PATIENT TYPE:  AMB   LOCATION:  DSC                                  FACILITY:  [facility]   PHYSICIAN:  Artist Pais. Mina Marble, M.D.           DATE OF BIRTH:  1950-09-05   DATE OF ADMISSION:  10/19/2002  DATE OF DISCHARGE:                                HISTORY & PHYSICAL   PREOPERATIVE DIAGNOSIS:  Displaced intra-articular fracture, distal radius,  right.   POSTOPERATIVE DIAGNOSIS:  Displaced intra-articular fracture, distal radius,  right.   PROCEDURES:  Open reduction, internal fixation using DVR-AN plate right with  smooth pegs and screws.   SURGEON:  Artist Pais. Mina Marble, M.D.   ASSISTANT:  __________   ANESTHESIA:  General.   TOURNIQUET TIME:  45 minutes.   COMPLICATIONS:  No complications.   OPERATION PERFORMED:  Patient was taken to the  operating room. With the  induction of adequate general anesthesia, the right upper extremity is  prepped and draped in the usual sterile fashion.  Esmarched and  exsanguinated the limb.  Tourniquet inflated at 750 millimeters of mercury.  At this point in time, a longitudinal incision was made of the palpable  border of the flexor carpal radialis tendon overlying the distal form wrist  area on the right wrist.  Incision taken down through the skin and  subcutaneous tissues.  The sheath overlying the FCR tendon was incised.  The  FCR was retracted to the  midline, the radial artery and venae comitantes to  the  lateral side.  The fascia overlying the forearm musculature was  incised.  This was retracted down to the  level of the pronator quadratus.  The pronator quadratus was subperiosteally dissected off the distal radius  revealing intra-articular fracture of the distal radius with two large  fragments including radial styloid and ulnar facet-type fragment.  The  fracture site was debrided of clot.  Reduction was  preformed.  There was a  significant loss of cancellus integrity, and cancellus chips were placed  into the fracture site.  It was then fixed with a DVR-AN plate using smooth  pins times six as well as the standard screws for the proximal part of the  plate.   Intraoperative x-rays showed good reduction on both the AP, lateral, and  oblique view.   The wound was then thoroughly irrigated and loosely closed with a running 3-  0 Prolene subcuticular stitch, Steri-Strips, __________, and a volar splint  was applied.  Patient tolerated the procedure well and returned in stable  fashion.                                                  Artist Pais Mina Marble, M.D.    MAW/MEDQ  D:  10/19/2002  T:  10/20/2002  Job:  [id number]

## 2011-02-14 ENCOUNTER — Ambulatory Visit (INDEPENDENT_AMBULATORY_CARE_PROVIDER_SITE_OTHER): Payer: Medicaid Other | Admitting: Cardiovascular Disease

## 2011-02-14 ENCOUNTER — Encounter: Payer: Self-pay | Admitting: Cardiovascular Disease

## 2011-02-14 DIAGNOSIS — E0590 Thyrotoxicosis, unspecified without thyrotoxic crisis or storm: Secondary | ICD-10-CM

## 2011-02-14 DIAGNOSIS — I4891 Unspecified atrial fibrillation: Secondary | ICD-10-CM

## 2011-02-14 NOTE — Progress Notes (Signed)
60 yo patient of Dr Deborah Chalk.  Hospitalized May with dehydration, gastritis, subclinical hyperthyroidism.. Reviewed Dr Angelina Pih consult note from [facility] 4/25 Echo with normal EF and mild MR. Transient afib that converted with amiodarone. No palpiatations, dyspnea or syncope since D/C. Reviewed ECG and telemetry. PAF 36 hrs and NSR thereafter. Given ? Of subclinical hyperthyroidism and long term side effects of amiodarone will stop this medicine for now. Continues to have gastritis and servere reflux. Taking over the counter nexium. Reveiwed lab work and cortisol stim test normal, CPK's normal, TSH suprressed at .086 with normal total T4. Clinically not hyerthyroid Has F/U Dr Sharl Ma    Reviewed hospital  Consult Dr Deborah Chalk  Lab Work  Telemetry/ECG x2  Echo   ROS: Denies fever, malais, weight loss, blurry vision, decreased visual acuity, cough, sputum, SOB, hemoptysis, pleuritic pain, palpitaitons, heartburn, abdominal pain, melena, lower extremity edema, claudication, or rash.  All other systems reviewed and negative  General: Affect appropriate Healthy:  appears stated age HEENT: normal Neck supple with no adenopathy JVP normal no bruits no thyromegaly Lungs clear with no wheezing and good diaphragmatic motion Heart:  S1/S2 no murmur,rub, gallop or click PMI normal Abdomen: benighn, BS positve, no tenderness, no AAA no bruit.  No HSM or HJR Distal pulses intact with no bruits No edema Neuro non-focal Skin warm and dry No muscular weakness   Current Outpatient Prescriptions  Medication Sig Dispense Refill  . Ascorbic Acid (VITAMIN C PO) Take by mouth daily.        Marland Kitchen aspirin 81 MG tablet Take 81 mg by mouth daily.        . Calcium Carbonate-Vitamin D (CALCIUM + D PO) Take 1 capsule by mouth daily.        Marland Kitchen HYDROcodone-acetaminophen (VICODIN) 5-500 MG per tablet Take 1 tablet by mouth every 6 (six) hours as needed.        . naproxen sodium (ANAPROX) 220 MG tablet Take 220 mg by mouth as  needed.        . vitamin B-12 (CYANOCOBALAMIN) 1000 MCG tablet Take 1,000 mcg by mouth daily.          Allergies  Clindamycin/lincomycin  Electrocardiogram:  Assessment and Plan

## 2011-02-14 NOTE — Patient Instructions (Signed)
Your physician recommends that you schedule a follow-up appointment in: AS NEEDED  Your physician recommends that you continue on your current medications as directed. Please refer to the Current Medication list given to you today.  

## 2011-02-14 NOTE — Assessment & Plan Note (Signed)
Maint NSR with no palpitations.  Will need beta blocker if thyroid issues not addressed quickly.  Arranged appt with Dr Sharl Ma in 2 weeks

## 2011-02-14 NOTE — Assessment & Plan Note (Signed)
Suppressed TSH in hospital.  F/U Dr Sharl Ma Likely need radioiodine tracer study.

## 2011-04-09 ENCOUNTER — Inpatient Hospital Stay (INDEPENDENT_AMBULATORY_CARE_PROVIDER_SITE_OTHER)
Admission: RE | Admit: 2011-04-09 | Discharge: 2011-04-09 | Disposition: A | Discharge status: Home / Self Care | Payer: Medicaid Other | Source: Ambulatory Visit | Attending: Family Medicine | Admitting: Family Medicine

## 2011-04-09 DIAGNOSIS — B029 Zoster without complications: Secondary | ICD-10-CM

## 2011-07-01 ENCOUNTER — Other Ambulatory Visit: Payer: Self-pay | Admitting: Internal Medicine

## 2011-07-01 DIAGNOSIS — E0590 Thyrotoxicosis, unspecified without thyrotoxic crisis or storm: Secondary | ICD-10-CM

## 2011-07-23 ENCOUNTER — Ambulatory Visit (HOSPITAL_COMMUNITY): Payer: Medicaid Other

## 2011-07-24 ENCOUNTER — Other Ambulatory Visit (HOSPITAL_COMMUNITY): Payer: Medicaid Other

## 2012-04-12 ENCOUNTER — Encounter (HOSPITAL_COMMUNITY): Payer: Medicaid Other

## 2012-04-12 ENCOUNTER — Ambulatory Visit (HOSPITAL_COMMUNITY): Payer: Medicaid Other

## 2012-04-13 ENCOUNTER — Other Ambulatory Visit (HOSPITAL_COMMUNITY): Payer: Medicaid Other

## 2014-05-11 ENCOUNTER — Telehealth: Payer: Self-pay | Admitting: Cardiology

## 2014-05-11 NOTE — Telephone Encounter (Signed)
Received 6 pages of records from [facility] (Dr Tamsen Roers) for appointment with Dr Percival Spanish on 05/23/14.  Records given to N. Hines (medical records) for Dr Hochrein's schedule on 05/23/14  lp

## 2014-05-19 ENCOUNTER — Encounter: Payer: Self-pay | Admitting: *Deleted

## 2014-05-23 ENCOUNTER — Other Ambulatory Visit: Payer: Self-pay | Admitting: Cardiology

## 2014-05-23 ENCOUNTER — Ambulatory Visit (INDEPENDENT_AMBULATORY_CARE_PROVIDER_SITE_OTHER): Payer: PRIVATE HEALTH INSURANCE | Admitting: Cardiology

## 2014-05-23 ENCOUNTER — Encounter: Payer: Self-pay | Admitting: Cardiology

## 2014-05-23 VITALS — BP 112/68 | HR 140 | Ht 61.0 in | Wt 126.5 lb

## 2014-05-23 DIAGNOSIS — I482 Chronic atrial fibrillation, unspecified: Secondary | ICD-10-CM

## 2014-05-23 MED ORDER — RIVAROXABAN 20 MG PO TABS: 20.0000 mg | ORAL_TABLET | Freq: Every day | ORAL | Status: DC

## 2014-05-23 NOTE — Patient Instructions (Signed)
Start Xarelto 20 mg daily with supper   Stop Pradaxa   Take Metoprolol 50 mg twice a day   Lab work today  ( tsh,t3,t4,bmet,cbc )   Your physician recommends that you schedule a follow-up appointment in: 1 week with Cecilie Kicks NP Monday 06/05/14 at 3:30 pm.

## 2014-05-23 NOTE — Progress Notes (Signed)
HPI The patient presents for evaluation of atrial fibrillation. She previously saw Dr. Doreatha Lew and Dr. Johnsie Cancel.  She has had transient atrial fibrillation apparently when she was hospitalized with dehydration and subclinical hyperthyroidism. She was treated with amiodarone.she was seen by Dr. Rex Kras in [place] on 1029 and was in atrial fibrillation with a rapid rate areas treated with Robaxin. She was to be taking metoprolol 50 mg twice a day but has only been taking it once a day. However, she did not start the blood thinner.  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 She's not really noticing the palpitations. She's not had any presyncope or syncope. She has to lift and move him and she might short of breath with this. However, she denies any chest pressure, neck or arm discomfort. She's not having any new PND or orthopnea. Of note she's had a diagnosis of Graves' disease but she has not seen her endocrinologist Dr. Buddy Duty in a couple of years. Unfortunately IT in any labs from her primary care office as they are closed. She does not report any recent bleeding or bruising.  Allergies  Allergen Reactions  . Clindamycin/Lincomycin     rash    Current Outpatient Prescriptions  Medication Sig Dispense Refill  . ALPRAZolam (XANAX) 1 MG tablet Take 1 mg by mouth 3 (three) times daily as needed.  0  . amoxicillin-clavulanate (AUGMENTIN) 500-125 MG per tablet Take 1 tablet by mouth 2 (two) times daily.  0  . Calcium Carbonate-Vitamin D (CALCIUM + D PO) Take 1 capsule by mouth daily.      . metoprolol (LOPRESSOR) 50 MG tablet Take 50 mg by mouth 2 (two) times daily.  2  . naproxen sodium (ANAPROX) 220 MG tablet Take 220 mg by mouth as needed.      Marland Kitchen oxyCODONE-acetaminophen (PERCOCET/ROXICET) 5-325 MG per tablet Take by mouth every 4 (four) hours as needed for severe pain.    . vitamin B-12 (CYANOCOBALAMIN) 1000 MCG tablet Take 1,000 mcg by mouth daily.       No  current facility-administered medications for this visit.    Past Medical History  Diagnosis Date  . Atrial fibrillation     newly diagnosed 10/2010  . Rheumatoid arthritis(714.0)   . Subclinical hyperthyroidism   . Anxiety     Past Surgical History  Procedure Laterality Date  . Tubal ligation      Family History  Problem Relation Age of Onset  . Heart attack Mother 23    pacemaker age 80  . Cancer Father     colon and prostate  . Diabetes Brother   . Heart attack Other     cause of death    History   Social History  . Marital Status: Widowed    Spouse Name: N/A    Number of Children: 1  . Years of Education: N/A   Occupational History  . laborer     [place]  . Smoking status: Current Every Day Smoker    Types: Cigarettes  . Smokeless tobacco: Not on file     Comment: 5 cigs daily  . Alcohol Use: Yes     Comment: Rarely  . Drug Use: No  . Sexual Activity: Not on file   Other Topics Concern  . Not on file   Social History Narrative    ROS:  As stated in the HPI and negative for all other  systems.  PHYSICAL EXAM BP 112/68 mmHg  Pulse 140  Ht 5\' 1"  (1.549 m)  Wt 126 lb 8 oz (57.38 kg)  BMI 23.91 kg/m2  GENERAL:  Well appearing HEENT:  Pupils equal round and reactive, fundi not visualized, oral mucosa unremarkable, poor dentition NECK:  No jugular venous distention, waveform within normal limits, carotid upstroke brisk and symmetric, no bruits, no thyromegaly LYMPHATICS:  No cervical, inguinal adenopathy LUNGS:  Clear to auscultation bilaterally BACK:  No CVA tenderness CHEST:  Unremarkable HEART:  PMI not displaced or sustained,S1 and S2 within normal limits, no S3, no clicks, no rubs, no murmurs, irregular ABD:  Flat, positive bowel sounds normal in frequency in pitch, no bruits, no rebound, no guarding, no midline pulsatile mass, no hepatomegaly, no splenomegaly EXT:  2 plus pulses throughout, no edema, no  cyanosis no clubbing SKIN:  No rashes no nodules NEURO:  Cranial nerves II through XII grossly intact, motor grossly intact throughout PSYCH:  Cognitively intact, oriented to person place and time  EKG:  Atrial fibrillation with rapid rate, axis within normal limits, intervals within normal limits, poor anterior R wave progression.  05/23/2014  ASSESSMENT AND PLAN  ATRIAL FIB:   She has atrial fibrillation with rapid rate. I would like to hospitalize her but she said she could not do this and she has to care for her son and has no weight otherwise take care of him. I don't see any contraindication to full dose anticoagulation and for compliance sake I would like to make this once daily Xarelto. I will start 20 mg. However, I will check a basic metabolic profile. Check a CBC. I'm also going to check her thyroid studies as below. We discussed at length the risks benefits and assuming that she might need cardioversion she understands she would need to be on anticoagulation. She'll stand and agrees the risk of bleeding and will start this medication. She also agrees to start her metoprolol 50 mg twice a day. However, we're going to need to see her soon to make sure she has reasonable rate control.  TOBACCO ABUSE:  We discussed the need to quit smoking and she says she will try to work on this.  THYROID DISEASE:  I will check a TSH, T3 and T4.

## 2014-05-24 ENCOUNTER — Telehealth: Payer: Self-pay | Admitting: *Deleted

## 2014-05-24 LAB — CBC WITH DIFFERENTIAL/PLATELET
BASOS PCT: 1 % (ref 0–1)
Basophils Absolute: 0.1 10*3/uL (ref 0.0–0.1)
Eosinophils Absolute: 0.1 10*3/uL (ref 0.0–0.7)
Eosinophils Relative: 1 % (ref 0–5)
HCT: 41.7 % (ref 36.0–46.0)
Hemoglobin: 14 g/dL (ref 12.0–15.0)
Lymphocytes Relative: 37 % (ref 12–46)
Lymphs Abs: 2.6 10*3/uL (ref 0.7–4.0)
MCH: 30.8 pg (ref 26.0–34.0)
MCHC: 33.6 g/dL (ref 30.0–36.0)
MCV: 91.6 fL (ref 78.0–100.0)
MONO ABS: 0.8 10*3/uL (ref 0.1–1.0)
Monocytes Relative: 11 % (ref 3–12)
NEUTROS ABS: 3.5 10*3/uL (ref 1.7–7.7)
NEUTROS PCT: 50 % (ref 43–77)
Platelets: 223 10*3/uL (ref 150–400)
RBC: 4.55 MIL/uL (ref 3.87–5.11)
RDW: 14.3 % (ref 11.5–15.5)
WBC: 7 10*3/uL (ref 4.0–10.5)

## 2014-05-24 LAB — T4: T4 TOTAL: 6.7 ug/dL (ref 4.5–12.0)

## 2014-05-24 LAB — TSH: TSH: 0.359 u[IU]/mL (ref 0.350–4.500)

## 2014-05-24 LAB — T3: T3 TOTAL: 162.6 ng/dL (ref 80.0–204.0)

## 2014-05-24 NOTE — Telephone Encounter (Signed)
Faxed PA for Xarelto to [facility]

## 2014-05-25 ENCOUNTER — Telehealth: Payer: Self-pay | Admitting: *Deleted

## 2014-05-25 LAB — BASIC METABOLIC PANEL
BUN: 15 mg/dL (ref 6–23)
CALCIUM: 10 mg/dL (ref 8.4–10.5)
CO2: 21 mEq/L (ref 19–32)
Chloride: 102 mEq/L (ref 96–112)
Creat: 1.02 mg/dL (ref 0.50–1.10)
Glucose, Bld: 104 mg/dL — ABNORMAL HIGH (ref 70–99)
POTASSIUM: 4.4 meq/L (ref 3.5–5.3)
SODIUM: 136 meq/L (ref 135–145)

## 2014-05-25 NOTE — Telephone Encounter (Signed)
PA xarelto approved until 05/25/2015 under medicare part D.  Reference: [id number]  Faxed approval info to [facility] on [address], [place] @ [phone]

## 2014-06-05 ENCOUNTER — Ambulatory Visit (INDEPENDENT_AMBULATORY_CARE_PROVIDER_SITE_OTHER): Payer: PRIVATE HEALTH INSURANCE | Admitting: Cardiology

## 2014-06-05 ENCOUNTER — Encounter: Payer: Self-pay | Admitting: Cardiology

## 2014-06-05 VITALS — BP 122/90 | HR 92 | Ht 61.0 in | Wt 125.4 lb

## 2014-06-05 DIAGNOSIS — Z7901 Long term (current) use of anticoagulants: Secondary | ICD-10-CM

## 2014-06-05 DIAGNOSIS — I480 Paroxysmal atrial fibrillation: Secondary | ICD-10-CM

## 2014-06-05 MED ORDER — METOPROLOL TARTRATE 50 MG PO TABS: 75.0000 mg | ORAL_TABLET | Freq: Two times a day (BID) | ORAL | Status: DC

## 2014-06-05 NOTE — Patient Instructions (Signed)
INCREASE Metoprolol to 50mg  1 1/2 tablets twice a day.  A new Rx has been sent to your pharmacy.  DO NOT FORGET TO TAKE THE XARELTO DAILY.  Your physician recommends that you schedule a follow-up appointment in: Dr. Percival Spanish or an extender in 2 weeks.

## 2014-06-05 NOTE — Assessment & Plan Note (Signed)
Continues in atrial fibrillation though heart rate has improved now 119 bpm was 140 on her last visit.  Will increase metoprolol to 75 mg twice a day she is somewhat anxious about this but was reassured.  She is taking her Xarelto every day with supper. I'll have her come back in 2 weeks and at that time plan will be to schedule her for cardioversion. She'll either see Dr. Percival Spanish or an APP.

## 2014-06-05 NOTE — Progress Notes (Signed)
06/05/2014   PCP: Tamsen Roers, MD   Chief Complaint  Patient presents with  . Follow-up    pt denies chest pain and swelling. pt states that she does experience sob because she has had a cold.    Primary Cardiologist:Dr. Vita Barley   HPI:   63 year old female, lst seen by Dr. Lenna Sciara. Percival Spanish 11/101/5 for rapid a fib.  He recommended admit to Hospital but pt takes care of her son with paralysis and did not wish to go.  Her thyroid, bmet and cbc were normal.  She is back today for follow up.      She was placed on xarelto on last visit.  She is on lopressor 50 mg BID.  She is not aware that she has atrial fib.  She has just completed antibiotics for URI with cough.  No chest pain, no SOB.      Allergies  Allergen Reactions  . Clindamycin/Lincomycin     rash    Current Outpatient Prescriptions  Medication Sig Dispense Refill  . ALPRAZolam (XANAX) 1 MG tablet Take 1 mg by mouth 3 (three) times daily as needed.  0  . amoxicillin-clavulanate (AUGMENTIN) 500-125 MG per tablet Take 1 tablet by mouth 2 (two) times daily.  0  . Calcium Carbonate-Vitamin D (CALCIUM + D PO) Take 1 capsule by mouth daily.      . metoprolol (LOPRESSOR) 50 MG tablet Take 1.5 tablets (75 mg total) by mouth 2 (two) times daily. 90 tablet 5  . oxyCODONE-acetaminophen (PERCOCET/ROXICET) 5-325 MG per tablet Take by mouth every 4 (four) hours as needed for severe pain.    . rivaroxaban (XARELTO) 20 MG TABS tablet Take 1 tablet (20 mg total) by mouth daily with supper. 30 tablet 6  . vitamin B-12 (CYANOCOBALAMIN) 1000 MCG tablet Take 1,000 mcg by mouth daily.       No current facility-administered medications for this visit.    Past Medical History  Diagnosis Date  . Atrial fibrillation     newly diagnosed 10/2010  . Rheumatoid arthritis(714.0)   . Subclinical hyperthyroidism   . Anxiety     Past Surgical History  Procedure Laterality Date  . Tubal ligation    . Arm surgery      fracture,  right arm  . Cataract extraction      FBX:UXYBFXO:VA colds or fevers, no weight changes Skin:no rashes or ulcers HEENT:no blurred vision, no congestion CV:see HPI PUL:see HPI GI:no diarrhea constipation or melena, no indigestion GU:no hematuria, no dysuria MS:no joint pain, no claudication Neuro:no syncope, no lightheadedness Endo:no diabetes, no thyroid disease  Wt Readings from Last 3 Encounters:  06/05/14 125 lb 6.4 oz (56.881 kg)  05/23/14 126 lb 8 oz (57.38 kg)  02/14/11 119 lb (53.978 kg)    PHYSICAL EXAM BP 122/90 mmHg  Pulse 92  Ht 5' 1" (1.549 m)  Wt 125 lb 6.4 oz (56.881 kg)  BMI 23.71 kg/m2 General:Pleasant affect, NAD Skin:Warm and dry, brisk capillary refill HEENT:normocephalic, sclera clear, mucus membranes moist Neck:supple, no JVD, no bruits  Heart:irreg irreg rapid without murmur, gallup, rub or click Lungs:clear without rales, rhonchi, or wheezes NVB:TYOM, non tender, + BS, do not palpate liver spleen or masses Ext:no lower ext edema, 2+ pedal pulses, 2+ radial pulses Neuro:alert and oriented, MAE, follows commands, + facial symmetry EKG:A flutter with RVR, rate 119-improved.  No acute changes otherwise.   ASSESSMENT AND PLAN A-fib Continues in atrial fibrillation  though heart rate has improved now 119 bpm was 140 on her last visit.  Will increase metoprolol to 75 mg twice a day she is somewhat anxious about this but was reassured.  She is taking her Xarelto every day with supper. I'll have her come back in 2 weeks and at that time plan will be to schedule her for cardioversion. She'll either see Dr. Percival Spanish or an APP.  Anticoagulation adequate On xarelto, taking every day.

## 2014-06-05 NOTE — Assessment & Plan Note (Signed)
On xarelto, taking every day.

## 2014-06-07 ENCOUNTER — Other Ambulatory Visit: Payer: Self-pay | Admitting: Family Medicine

## 2014-06-07 DIAGNOSIS — Z1231 Encounter for screening mammogram for malignant neoplasm of breast: Secondary | ICD-10-CM

## 2014-06-20 NOTE — Addendum Note (Signed)
Addended byChauncy Lean. on: 06/20/2014 02:54 PM   Modules accepted: Orders

## 2014-06-23 ENCOUNTER — Ambulatory Visit (INDEPENDENT_AMBULATORY_CARE_PROVIDER_SITE_OTHER): Payer: PRIVATE HEALTH INSURANCE | Admitting: Cardiology

## 2014-06-23 ENCOUNTER — Encounter: Payer: Self-pay | Admitting: Cardiology

## 2014-06-23 VITALS — BP 110/80 | HR 104 | Ht 61.5 in | Wt 126.3 lb

## 2014-06-23 DIAGNOSIS — I4819 Other persistent atrial fibrillation: Secondary | ICD-10-CM

## 2014-06-23 DIAGNOSIS — Z7901 Long term (current) use of anticoagulants: Secondary | ICD-10-CM

## 2014-06-23 DIAGNOSIS — I481 Persistent atrial fibrillation: Secondary | ICD-10-CM

## 2014-06-23 NOTE — Patient Instructions (Signed)
Please follow up with Mickel Baas, NP or Dr. Percival Spanish the FIRST week of January to be seen and schedule a cardioversion.  Your physician has recommended that you have a Cardioversion (DCCV). Electrical Cardioversion uses a jolt of electricity to your heart either through paddles or wired patches attached to your chest. This is a controlled, usually prescheduled, procedure. Defibrillation is done under light anesthesia in the hospital, and you usually go home the day of the procedure. This is done to get your heart back into a normal rhythm. You are not awake for the procedure. Please see the instruction sheet given to you today.

## 2014-06-23 NOTE — Progress Notes (Signed)
06/24/2014   PCP: Tamsen Roers, MD   Chief Complaint  Patient presents with  . Follow-up    2 weeks follow-up Afib    Primary Cardiologist:Dr. Vita Barley   HPI:  63 year old female, lst seen by Dr. Lenna Sciara. Percival Spanish 11/101/5 for rapid a fib. He recommended admit to Hospital but pt takes care of her son with paralysis and did not wish to go. Her thyroid, bmet and cbc were normal. She was placed on xarelto. She is now on lopressor 75 mg BID. She is not aware that she has atrial fib. She has just completed antibiotics for URI with cough. No chest pain, no SOB except with walking 3 dogs at the same time or cleaning her house she does become short of breath.  She is back today for follow-up. She remains in atrial fibrillation rate 104 though the rate is improved she continues in atrial flutter. Briefly discussed with Dr. Orlean Patten on we will plan to schedule her cardioversion. Discussing with the patient and her friend she is very anxious and cannot schedule it today. She has a son she cares for physically and needs to be home for him therefore she will have to coordinate the court cardioversion with her other son he is a Company secretary. She prefers not to do this all after the first of the year. I'll have her come back and see me first week in January or Dr. Percival Spanish first week in Jan. and get this scheduled.   She was congratulated on cutting back on her tobacco use she is now with ultralights only smokes only 2 puffs and those are 5 cigarettes a day.  She has also stopped her caffeine.  Allergies  Allergen Reactions  . Clindamycin/Lincomycin     rash    Current Outpatient Prescriptions  Medication Sig Dispense Refill  . ALPRAZolam (XANAX) 1 MG tablet Take 1 mg by mouth 3 (three) times daily as needed.  0  . Calcium Carbonate-Vitamin D (CALCIUM + D PO) Take 1 capsule by mouth daily.      . citalopram (CELEXA) 10 MG tablet Take 1 tablet by mouth daily.  6  . metoprolol  (LOPRESSOR) 50 MG tablet Take 1.5 tablets (75 mg total) by mouth 2 (two) times daily. 90 tablet 5  . oxyCODONE-acetaminophen (PERCOCET/ROXICET) 5-325 MG per tablet Take by mouth every 4 (four) hours as needed for severe pain.    . rivaroxaban (XARELTO) 20 MG TABS tablet Take 1 tablet (20 mg total) by mouth daily with supper. 30 tablet 6  . vitamin B-12 (CYANOCOBALAMIN) 1000 MCG tablet Take 1,000 mcg by mouth daily.       No current facility-administered medications for this visit.    Past Medical History  Diagnosis Date  . Atrial fibrillation     newly diagnosed 10/2010  . Rheumatoid arthritis(714.0)   . Subclinical hyperthyroidism   . Anxiety     Past Surgical History  Procedure Laterality Date  . Tubal ligation    . Arm surgery      fracture, right arm  . Cataract extraction      LZJ:QBHALPF:XT colds or fevers, no weight changes Skin:no rashes or ulcers HEENT:no blurred vision, no congestion CV:see HPI PUL:see HPI GI:no diarrhea constipation or melena, no indigestion GU:no hematuria, no dysuria MS:no joint pain, no claudication Neuro:no syncope, no lightheadedness Endo:no diabetes, no thyroid disease  Wt Readings from Last 3 Encounters:  06/23/14 126 lb 4.8 oz (57.289 kg)  06/05/14 125 lb 6.4 oz (56.881 kg)  05/23/14 126 lb 8 oz (57.38 kg)    PHYSICAL EXAM BP 110/80 mmHg  Pulse 104  Ht 5' 1.5" (1.562 m)  Wt 126 lb 4.8 oz (57.289 kg)  BMI 23.48 kg/m2 General:Pleasant affect, NAD Skin:Warm and dry, brisk capillary refill HEENT:normocephalic, sclera clear, mucus membranes moist Neck:supple, no JVD, no bruits  Heart:S1S2 RRR without murmur, gallup, rub or click Lungs:clear without rales, rhonchi, or wheezes SPV:ICFP, non tender, + BS, do not palpate liver spleen or masses Ext:no lower ext edema, 2+ pedal pulses, 2+ radial pulses Neuro:alert and oriented, MAE, follows commands, + facial symmetry EKG: atrial fib  at 104 slower rate,  Fu 1st week in Jan to  schedule dccv ASSESSMENT AND PLAN A-fib Rate is now down to 104. Though with exertion she does develop shortness of breath was likely due to increased heart rate at that time. We discussed cardioversion she is unable to go through with this interval after the first of the year when she has someone who can stay with her disabled son. She will see me back first week in January or Dr. Percival Spanish to arrange this.  Anticoagulation adequate She has not had any bleeding. She has not missed any Xarelto tablets.

## 2014-06-24 NOTE — Assessment & Plan Note (Signed)
Rate is now down to 104. Though with exertion she does develop shortness of breath was likely due to increased heart rate at that time. We discussed cardioversion she is unable to go through with this interval after the first of the year when she has someone who can stay with her disabled son. She will see me back first week in January or Dr. Percival Spanish to arrange this.

## 2014-06-24 NOTE — Assessment & Plan Note (Signed)
She has not had any bleeding. She has not missed any Xarelto tablets.

## 2014-06-27 ENCOUNTER — Ambulatory Visit (HOSPITAL_COMMUNITY)
Admission: RE | Admit: 2014-06-27 | Discharge: 2014-06-27 | Disposition: A | Discharge status: Home / Self Care | Payer: PRIVATE HEALTH INSURANCE | Source: Ambulatory Visit | Attending: Family Medicine | Admitting: Family Medicine

## 2014-06-27 DIAGNOSIS — Z1231 Encounter for screening mammogram for malignant neoplasm of breast: Secondary | ICD-10-CM

## 2014-07-21 ENCOUNTER — Ambulatory Visit (INDEPENDENT_AMBULATORY_CARE_PROVIDER_SITE_OTHER): Payer: Medicare Other | Admitting: Cardiology

## 2014-07-21 ENCOUNTER — Encounter: Payer: Self-pay | Admitting: Cardiology

## 2014-07-21 VITALS — BP 117/80 | HR 115 | Ht 61.0 in | Wt 124.4 lb

## 2014-07-21 DIAGNOSIS — M069 Rheumatoid arthritis, unspecified: Secondary | ICD-10-CM | POA: Insufficient documentation

## 2014-07-21 DIAGNOSIS — Z01812 Encounter for preprocedural laboratory examination: Secondary | ICD-10-CM

## 2014-07-21 DIAGNOSIS — Z720 Tobacco use: Secondary | ICD-10-CM

## 2014-07-21 DIAGNOSIS — I480 Paroxysmal atrial fibrillation: Secondary | ICD-10-CM

## 2014-07-21 DIAGNOSIS — F17200 Nicotine dependence, unspecified, uncomplicated: Secondary | ICD-10-CM | POA: Insufficient documentation

## 2014-07-21 NOTE — Assessment & Plan Note (Signed)
Adequate rate control

## 2014-07-21 NOTE — Patient Instructions (Signed)
Your physician has recommended that you have a Cardioversion (DCCV). Electrical Cardioversion uses a jolt of electricity to your heart either through paddles or wired patches attached to your chest. This is a controlled, usually prescheduled, procedure. Defibrillation is done under light anesthesia in the hospital, and you usually go home the day of the procedure. This is done to get your heart back into a normal rhythm. You are not awake for the procedure. Please see the instruction sheet given to you today. This will be done by Dr. Percival Spanish  Your physician recommends that you return for lab work and CXR within 7 days of your procedure.  Your physician recommends that you schedule a follow-up appointment 2 WEEKS POST PROCEDURE.. This will be scheduled at the time of your discharge.

## 2014-07-21 NOTE — Assessment & Plan Note (Signed)
On Xarelto 

## 2014-07-21 NOTE — Progress Notes (Signed)
    07/21/2014 Rhonda Marshall   09/15/1950  [id number]  Primary Physician Tamsen Roers, MD Primary Cardiologist: Dr Percival Spanish  HPI:  64 y/o female seen by Dr Percival Spanish in Nov for AF with RVR. She had han abnormal TSH and there was concern she had AF in the setting of hyperthyroidsim. Her CHADSVASC score otherwise is 1. She was started on Xarelto Nov 11th. She had a f/u TSH, free T4, and T3 which were WNL. Her beta blocker was adjusted by Cecilie Kicks 06/05/14. She is in the office today for follow up. She is unaware of her AF. 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 She came to the office with her sisters.   Current Outpatient Prescriptions  Medication Sig Dispense Refill  . ALPRAZolam (XANAX) 1 MG tablet Take 1 mg by mouth 3 (three) times daily as needed.  0  . Calcium Carbonate-Vitamin D (CALCIUM + D PO) Take 1 capsule by mouth daily.      . citalopram (CELEXA) 10 MG tablet Take 1 tablet by mouth daily.  6  . metoprolol (LOPRESSOR) 50 MG tablet Take 1.5 tablets (75 mg total) by mouth 2 (two) times daily. 90 tablet 5  . oxyCODONE-acetaminophen (PERCOCET/ROXICET) 5-325 MG per tablet Take by mouth every 4 (four) hours as needed for severe pain.    . rivaroxaban (XARELTO) 20 MG TABS tablet Take 1 tablet (20 mg total) by mouth daily with supper. 30 tablet 6  . vitamin B-12 (CYANOCOBALAMIN) 1000 MCG tablet Take 1,000 mcg by mouth daily.       No current facility-administered medications for this visit.    Allergies  Allergen Reactions  . Clindamycin/Lincomycin     rash    History   Social History  . Marital Status: Widowed    Spouse Name: N/A    Number of Children: 1  . Years of Education: N/A   Occupational History  . laborer     [employer]  . Smoking status: Current Every Day Smoker    Types: Cigarettes  . Smokeless tobacco: Not on file     Comment: 5 cigs daily  . Alcohol Use: Yes   Comment: Rarely  . Drug Use: No  . Sexual Activity: Not on file   Other Topics Concern  . Not on file   Social History Narrative     Review of Systems: General: negative for chills, fever, night sweats or weight changes.  Cardiovascular: negative for chest pain, dyspnea on exertion, edema, orthopnea, palpitations, paroxysmal nocturnal dyspnea or shortness of breath Dermatological: negative for rash Respiratory: negative for cough or wheezing Urologic: negative for hematuria Abdominal: negative for nausea, vomiting, diarrhea, bright red blood per rectum, melena, or hematemesis Neurologic: negative for visual changes, syncope, or dizziness All other systems reviewed and are otherwise negative except as noted above.    Blood pressure 117/80, pulse 115, height 5\' 1"  (1.549 m), weight 124 lb 6.4 oz (56.427 kg).  General appearance: alert, cooperative and no distress Lungs: clear to auscultation bilaterally Heart: irregularly irregular rhythm  EKG AF septal Qs  ASSESSMENT AND PLAN:   A-fib Adequate rate control  Anticoagulation adequate On Xarelto   PLAN  Discussed with Dr Percival Spanish, will plan OP DCCV.   [facility] 07/21/2014 3:38 PM

## 2014-07-25 ENCOUNTER — Other Ambulatory Visit: Payer: Self-pay | Admitting: *Deleted

## 2014-07-25 DIAGNOSIS — Z01812 Encounter for preprocedural laboratory examination: Secondary | ICD-10-CM

## 2014-07-26 ENCOUNTER — Ambulatory Visit
Admission: RE | Admit: 2014-07-26 | Discharge: 2014-07-26 | Disposition: A | Discharge status: Home / Self Care | Payer: Medicare Other | Source: Ambulatory Visit | Attending: Cardiology | Admitting: Cardiology

## 2014-07-26 DIAGNOSIS — Z01812 Encounter for preprocedural laboratory examination: Secondary | ICD-10-CM

## 2014-07-26 DIAGNOSIS — F17200 Nicotine dependence, unspecified, uncomplicated: Secondary | ICD-10-CM

## 2014-07-27 LAB — CBC
HCT: 37.8 % (ref 36.0–46.0)
Hemoglobin: 12.5 g/dL (ref 12.0–15.0)
MCH: 30.7 pg (ref 26.0–34.0)
MCHC: 33.1 g/dL (ref 30.0–36.0)
MCV: 92.9 fL (ref 78.0–100.0)
MPV: 10.5 fL (ref 8.6–12.4)
Platelets: 275 10*3/uL (ref 150–400)
RBC: 4.07 MIL/uL (ref 3.87–5.11)
RDW: 15.4 % (ref 11.5–15.5)
WBC: 11.8 10*3/uL — ABNORMAL HIGH (ref 4.0–10.5)

## 2014-07-27 LAB — BASIC METABOLIC PANEL
BUN: 13 mg/dL (ref 6–23)
CO2: 28 mEq/L (ref 19–32)
Calcium: 9.7 mg/dL (ref 8.4–10.5)
Chloride: 103 mEq/L (ref 96–112)
Creat: 0.73 mg/dL (ref 0.50–1.10)
Glucose, Bld: 121 mg/dL — ABNORMAL HIGH (ref 70–99)
Potassium: 3.7 mEq/L (ref 3.5–5.3)
Sodium: 139 mEq/L (ref 135–145)

## 2014-07-27 LAB — PROTIME-INR
INR: 1.64 — ABNORMAL HIGH (ref <1.50)
Prothrombin Time: 19.4 seconds — ABNORMAL HIGH (ref 11.6–15.2)

## 2014-07-27 LAB — APTT: aPTT: 37 seconds (ref 24–37)

## 2014-08-01 ENCOUNTER — Telehealth: Payer: Self-pay | Admitting: Cardiology

## 2014-08-01 NOTE — Telephone Encounter (Signed)
Spoke with pt sister, aware labs and cxr will be fine for her procedure.

## 2014-08-01 NOTE — Telephone Encounter (Signed)
Please call,pt is having procedure on Friday.She had her xrays last Wednesday,is that too early? Will she need to take them again?l

## 2014-08-04 ENCOUNTER — Encounter (HOSPITAL_COMMUNITY): Admission: RE | Payer: Self-pay | Source: Ambulatory Visit

## 2014-08-04 ENCOUNTER — Ambulatory Visit (HOSPITAL_COMMUNITY): Admission: RE | Admit: 2014-08-04 | Payer: Medicare Other | Source: Ambulatory Visit | Admitting: Cardiology

## 2014-08-04 SURGERY — CARDIOVERSION
Anesthesia: Monitor Anesthesia Care

## 2014-08-09 ENCOUNTER — Other Ambulatory Visit: Payer: Self-pay | Admitting: Cardiology

## 2014-08-10 ENCOUNTER — Encounter (HOSPITAL_COMMUNITY): Payer: Self-pay

## 2014-08-10 ENCOUNTER — Encounter (HOSPITAL_COMMUNITY)
Admission: RE | Disposition: A | Discharge status: Home / Self Care | Payer: Self-pay | Source: Ambulatory Visit | Attending: Cardiology

## 2014-08-10 ENCOUNTER — Ambulatory Visit (HOSPITAL_COMMUNITY): Payer: Medicare Other | Admitting: Anesthesiology

## 2014-08-10 ENCOUNTER — Ambulatory Visit (HOSPITAL_COMMUNITY)
Admission: RE | Admit: 2014-08-10 | Discharge: 2014-08-10 | Disposition: A | Discharge status: Home / Self Care | Payer: Medicare Other | Source: Ambulatory Visit | Attending: Cardiology | Admitting: Cardiology

## 2014-08-10 DIAGNOSIS — F17210 Nicotine dependence, cigarettes, uncomplicated: Secondary | ICD-10-CM | POA: Diagnosis not present

## 2014-08-10 DIAGNOSIS — K219 Gastro-esophageal reflux disease without esophagitis: Secondary | ICD-10-CM | POA: Insufficient documentation

## 2014-08-10 DIAGNOSIS — I10 Essential (primary) hypertension: Secondary | ICD-10-CM | POA: Insufficient documentation

## 2014-08-10 DIAGNOSIS — Z881 Allergy status to other antibiotic agents status: Secondary | ICD-10-CM | POA: Insufficient documentation

## 2014-08-10 DIAGNOSIS — I4891 Unspecified atrial fibrillation: Secondary | ICD-10-CM

## 2014-08-10 DIAGNOSIS — E0590 Thyrotoxicosis, unspecified without thyrotoxic crisis or storm: Secondary | ICD-10-CM | POA: Diagnosis not present

## 2014-08-10 DIAGNOSIS — M069 Rheumatoid arthritis, unspecified: Secondary | ICD-10-CM | POA: Insufficient documentation

## 2014-08-10 DIAGNOSIS — Z01812 Encounter for preprocedural laboratory examination: Secondary | ICD-10-CM

## 2014-08-10 HISTORY — PX: CARDIOVERSION: SHX1299

## 2014-08-10 HISTORY — DX: Family history of other specified conditions: Z84.89

## 2014-08-10 SURGERY — CARDIOVERSION
Anesthesia: General

## 2014-08-10 MED ORDER — DEXTROSE-NACL 5-0.45 % IV SOLN: INTRAVENOUS | Status: DC

## 2014-08-10 MED ORDER — LIDOCAINE HCL (CARDIAC) 20 MG/ML IV SOLN
INTRAVENOUS | Status: DC | PRN
  Administered 2014-08-10: 20 mg via INTRAVENOUS

## 2014-08-10 MED ORDER — PROPOFOL 10 MG/ML IV BOLUS
INTRAVENOUS | Status: DC | PRN
  Administered 2014-08-10: 60 mg via INTRAVENOUS

## 2014-08-10 MED ORDER — SODIUM CHLORIDE 0.9 % IV SOLN: 250.0000 mL | INTRAVENOUS | Status: DC

## 2014-08-10 MED ORDER — SODIUM CHLORIDE 0.9 % IV SOLN
INTRAVENOUS | Status: DC | PRN
  Administered 2014-08-10: 10:00:00 via INTRAVENOUS

## 2014-08-10 MED ORDER — SODIUM CHLORIDE 0.9 % IJ SOLN: 3.0000 mL | INTRAMUSCULAR | Status: DC | PRN

## 2014-08-10 MED ORDER — HYDROCORTISONE 1 % EX CREA
1.0000 "application " | TOPICAL_CREAM | Freq: Three times a day (TID) | CUTANEOUS | Status: DC | PRN
  Filled 2014-08-10: qty 28

## 2014-08-10 MED ORDER — SODIUM CHLORIDE 0.9 % IV SOLN: INTRAVENOUS | Status: DC

## 2014-08-10 MED ORDER — SODIUM CHLORIDE 0.9 % IJ SOLN
3.0000 mL | Freq: Two times a day (BID) | INTRAMUSCULAR | Status: DC
Start: 2014-08-10 — End: 2014-08-10

## 2014-08-10 NOTE — Discharge Instructions (Signed)
Care After Refer to this sheet in the next few weeks. These instructions provide you with information on caring for yourself after your procedure. Your caregiver may also give you more specific instructions. Your treatment has been planned according to current medical practices, but problems sometimes occur. Call your caregiver if you have any problems or questions after your procedure. HOME CARE INSTRUCTIONS  If you were given medicine to help you relax (sedative), do not drive, operate machinery, or sign important documents for 24 hours.  Avoid alcohol and hot or warm beverages for the first 24 hours after the procedure.  Only take over-the-counter or prescription medicines for pain, discomfort, or fever as directed by your caregiver. You may resume taking your normal medicines unless your caregiver tells you otherwise. Ask your caregiver when you may resume taking medicines that may cause bleeding, such as aspirin, clopidogrel, or warfarin.  You may return to your normal diet and activities on the day after your procedure, or as directed by your caregiver. Walking may help to reduce any bloated feeling in your abdomen.  Drink enough fluids to keep your urine clear or pale yellow.  You may gargle with salt water if you have a sore throat. SEEK IMMEDIATE MEDICAL CARE IF:  You have severe nausea or vomiting.  You have severe abdominal pain, abdominal cramps that last longer than 6 hours, or abdominal swelling (distention).  You have severe shoulder or back pain.  You have trouble swallowing.  You have shortness of breath, your breathing is shallow, or you are breathing faster than normal.  You have a fever or a rapid heartbeat.  You vomit blood or material that looks like coffee grounds.  You have bloody, black, or tarry stools. MAKE SURE YOU:  Understand these instructions.  Will watch your condition.  Will get help right away if you are not doing well or get worse. Document  Released: 02/12/2004 Document Revised: 11/14/2013 Document Reviewed: 09/30/2011 [facility] Patient Information 2015 [place], [place]. This information is not intended to replace advice given to you by your health care provider. Make sure you discuss any questions you have with your health care provider.

## 2014-08-10 NOTE — H&P (View-Only) (Signed)
    07/21/2014 Rhonda Marshall   18-Oct-1950  [id number]  Primary Physician Tamsen Roers, MD Primary Cardiologist: Dr Percival Spanish  HPI:  64 y/o female seen by Dr Percival Spanish in Nov for AF with RVR. She had han abnormal TSH and there was concern she had AF in the setting of hyperthyroidsim. Her CHADSVASC score otherwise is 1. She was started on Xarelto Nov 11th. She had a f/u TSH, free T4, and T3 which were WNL. Her beta blocker was adjusted by Cecilie Kicks 06/05/14. She is in the office today for follow up. She is unaware of her AF. 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 She came to the office with her sisters.   Current Outpatient Prescriptions  Medication Sig Dispense Refill  . ALPRAZolam (XANAX) 1 MG tablet Take 1 mg by mouth 3 (three) times daily as needed.  0  . Calcium Carbonate-Vitamin D (CALCIUM + D PO) Take 1 capsule by mouth daily.      . citalopram (CELEXA) 10 MG tablet Take 1 tablet by mouth daily.  6  . metoprolol (LOPRESSOR) 50 MG tablet Take 1.5 tablets (75 mg total) by mouth 2 (two) times daily. 90 tablet 5  . oxyCODONE-acetaminophen (PERCOCET/ROXICET) 5-325 MG per tablet Take by mouth every 4 (four) hours as needed for severe pain.    . rivaroxaban (XARELTO) 20 MG TABS tablet Take 1 tablet (20 mg total) by mouth daily with supper. 30 tablet 6  . vitamin B-12 (CYANOCOBALAMIN) 1000 MCG tablet Take 1,000 mcg by mouth daily.       No current facility-administered medications for this visit.    Allergies  Allergen Reactions  . Clindamycin/Lincomycin     rash    History   Social History  . Marital Status: Widowed    Spouse Name: N/A    Number of Children: 1  . Years of Education: N/A   Occupational History  . laborer     [employer]  . Smoking status: Current Every Day Smoker    Types: Cigarettes  . Smokeless tobacco: Not on file     Comment: 5 cigs daily  . Alcohol Use: Yes   Comment: Rarely  . Drug Use: No  . Sexual Activity: Not on file   Other Topics Concern  . Not on file   Social History Narrative     Review of Systems: General: negative for chills, fever, night sweats or weight changes.  Cardiovascular: negative for chest pain, dyspnea on exertion, edema, orthopnea, palpitations, paroxysmal nocturnal dyspnea or shortness of breath Dermatological: negative for rash Respiratory: negative for cough or wheezing Urologic: negative for hematuria Abdominal: negative for nausea, vomiting, diarrhea, bright red blood per rectum, melena, or hematemesis Neurologic: negative for visual changes, syncope, or dizziness All other systems reviewed and are otherwise negative except as noted above.    Blood pressure 117/80, pulse 115, height 5\' 1"  (1.549 m), weight 124 lb 6.4 oz (56.427 kg).  General appearance: alert, cooperative and no distress Lungs: clear to auscultation bilaterally Heart: irregularly irregular rhythm  EKG AF septal Qs  ASSESSMENT AND PLAN:   A-fib Adequate rate control  Anticoagulation adequate On Xarelto   PLAN  Discussed with Dr Percival Spanish, will plan OP DCCV.   [facility] 07/21/2014 3:38 PM

## 2014-08-10 NOTE — Anesthesia Postprocedure Evaluation (Signed)
  Anesthesia Post-op Note  Patient: Rhonda Marshall  Procedure(s) Performed: Procedure(s): CARDIOVERSION (N/A)  Patient Location: Endoscopy Unit  Anesthesia Type:General  Level of Consciousness: awake, alert , oriented and patient cooperative  Airway and Oxygen Therapy: Patient Spontanous Breathing  Post-op Pain: none  Post-op Assessment: Post-op Vital signs reviewed, Patient's Cardiovascular Status Stable, Respiratory Function Stable, Patent Airway, No signs of Nausea or vomiting, Adequate PO intake, Pain level controlled, No headache, No backache, No residual numbness and No residual motor weakness  Post-op Vital Signs: Reviewed and stable  Last Vitals:  Filed Vitals:   08/10/14 1022  BP:   Pulse: 47  Temp:   Resp: 19    Complications: No apparent anesthesia complications

## 2014-08-10 NOTE — CV Procedure (Signed)
   Cardioversion Note  Rhonda Marshall [id number] June 01, 1951  Procedure: DC Cardioversion Indications: atrial fibrillation  Procedure Details Consent: Obtained Time Out: Verified patient identification, verified procedure, site/side was marked, verified correct patient position, special equipment/implants available, Radiology Safety Procedures followed,  medications/allergies/relevent history reviewed, required imaging and test results available.  Performed  The patient has been on adequate anticoagulation.  The patient received 20 mg of iv Lidocain and 60 mg of iv Propofol for sedation administered by anesthesia staff.  Synchronous cardioversion was performed at 120 and 150 joules.  The cardioversion was successful   Complications: No apparent complications Patient did tolerate procedure well.   Dorothy Spark, MD, [facility] 08/10/2014, 10:03 AM

## 2014-08-10 NOTE — Transfer of Care (Signed)
Immediate Anesthesia Transfer of Care Note  Patient: Rhonda Marshall  Procedure(s) Performed: Procedure(s): CARDIOVERSION (N/A)  Patient Location: Endoscopy Unit  Anesthesia Type:General  Level of Consciousness: awake, alert , oriented and patient cooperative  Airway & Oxygen Therapy: Patient Spontanous Breathing and Patient connected to nasal cannula oxygen  Post-op Assessment: Report given to PACU RN, Post -op Vital signs reviewed and stable and Patient moving all extremities X 4  Post vital signs: Reviewed and stable  Last Vitals:  Filed Vitals:   08/10/14 0836  BP: 134/57  Pulse: 91  Temp: 36.6 C  Resp: 12    Complications: No apparent anesthesia complications

## 2014-08-10 NOTE — Anesthesia Preprocedure Evaluation (Addendum)
Anesthesia Evaluation  Patient identified by MRN, date of birth, ID band Patient awake    Reviewed: Allergy & Precautions, NPO status , Patient's Chart, lab work & pertinent test results, reviewed documented beta blocker date and time   History of Anesthesia Complications Negative for: history of anesthetic complications  Airway Mallampati: II  TM Distance: >3 FB Neck ROM: Full    Dental  (+) Edentulous Upper, Partial Lower, Dental Advisory Given   Pulmonary Current Smoker,  breath sounds clear to auscultation        Cardiovascular hypertension, Pt. on medications and Pt. on home beta blockers + dysrhythmias Atrial Fibrillation Rhythm:Irregular Rate:Normal     Neuro/Psych Anxiety negative neurological ROS     GI/Hepatic Neg liver ROS, GERD-  Controlled,  Endo/Other  Hyperthyroidism   Renal/GU negative Renal ROS     Musculoskeletal  (+) Arthritis -, Rheumatoid disorders,    Abdominal   Peds  Hematology  (+) Blood dyscrasia (xarelto), ,   Anesthesia Other Findings   Reproductive/Obstetrics                          Anesthesia Physical Anesthesia Plan  ASA: III  Anesthesia Plan: General   Post-op Pain Management:    Induction: Intravenous  Airway Management Planned: Mask and Natural Airway  Additional Equipment:   Intra-op Plan:   Post-operative Plan:   Informed Consent: I have reviewed the patients History and Physical, chart, labs and discussed the procedure including the risks, benefits and alternatives for the proposed anesthesia with the patient or authorized representative who has indicated his/her understanding and acceptance.   Dental advisory given  Plan Discussed with: CRNA and Surgeon  Anesthesia Plan Comments: (Plan routine monitors, GA for cardioversion)       Anesthesia Quick Evaluation

## 2014-08-10 NOTE — Interval H&P Note (Signed)
History and Physical Interval Note:  08/10/2014 10:03 AM  Rhonda Marshall  has presented today for surgery, with the diagnosis of AFIB  The various methods of treatment have been discussed with the patient and family. After consideration of risks, benefits and other options for treatment, the patient has consented to  Procedure(s): CARDIOVERSION (N/A) as a surgical intervention .  The patient's history has been reviewed, patient examined, no change in status, stable for surgery.  I have reviewed the patient's chart and labs.  Questions were answered to the patient's satisfaction.     Dorothy Spark

## 2014-08-11 ENCOUNTER — Encounter (HOSPITAL_COMMUNITY): Payer: Self-pay | Admitting: Cardiology

## 2014-08-29 ENCOUNTER — Ambulatory Visit: Payer: Medicare Other | Admitting: Cardiology

## 2014-09-01 ENCOUNTER — Ambulatory Visit (INDEPENDENT_AMBULATORY_CARE_PROVIDER_SITE_OTHER): Payer: Medicare Other | Admitting: Cardiology

## 2014-09-01 ENCOUNTER — Encounter: Payer: Self-pay | Admitting: Cardiology

## 2014-09-01 VITALS — BP 134/76 | HR 113 | Ht 61.0 in | Wt 128.0 lb

## 2014-09-01 DIAGNOSIS — I4891 Unspecified atrial fibrillation: Secondary | ICD-10-CM

## 2014-09-01 NOTE — Progress Notes (Signed)
HPI The patient presents for evaluation of atrial fibrillation. She had rapid rate and is now status post DCCV.  She is now back in atrial fib.  Her rate however is slower.  She actually is not noticing her fibrillation. She does notice any palpitations. She has no presyncope or syncope. She says she is breathing better. She denies any chest pressure, neck or arm discomfort. She has no weight gain or edema. She has never really known what she is fibrillation.  Allergies  Allergen Reactions  . Clindamycin/Lincomycin Rash  . Latex Rash    Latex gloves    Current Outpatient Prescriptions  Medication Sig Dispense Refill  . ALPRAZolam (XANAX) 1 MG tablet Take 1 mg by mouth 3 (three) times daily as needed for anxiety.   0  . citalopram (CELEXA) 10 MG tablet Take 10 mg by mouth daily as needed (depression).   6  . metoprolol (LOPRESSOR) 50 MG tablet Take 1.5 tablets (75 mg total) by mouth 2 (two) times daily. 90 tablet 5  . oxyCODONE-acetaminophen (PERCOCET/ROXICET) 5-325 MG per tablet Take 1 tablet by mouth every 4 (four) hours as needed for severe pain.     . rivaroxaban (XARELTO) 20 MG TABS tablet Take 1 tablet (20 mg total) by mouth daily with supper. 30 tablet 6   No current facility-administered medications for this visit.    Past Medical History  Diagnosis Date  . Atrial fibrillation     newly diagnosed 10/2010  . Rheumatoid arthritis(714.0)   . Subclinical hyperthyroidism   . Anxiety   . Family history of adverse reaction to anesthesia     Sister's BP drops     Past Surgical History  Procedure Laterality Date  . Tubal ligation    . Arm surgery      fracture, right arm  . Cataract extraction    . Cardioversion N/A 08/10/2014    Procedure: CARDIOVERSION;  Surgeon: Dorothy Spark, MD;  Location: [facility] ENDOSCOPY;  Service: Cardiovascular;  Laterality: N/A;    ROS:  As stated in the HPI and negative for all other systems.  PHYSICAL EXAM BP 134/76 mmHg  Pulse 113  Ht 5'  1" (1.549 m)  Wt 128 lb (58.06 kg)  BMI 24.20 kg/m2  GENERAL:  Well appearing NECK:  No jugular venous distention, waveform within normal limits, carotid upstroke brisk and symmetric, no bruits, no thyromegaly LUNGS:  Clear to auscultation bilaterally BACK:  No CVA tenderness CHEST:  Unremarkable HEART:  PMI not displaced or sustained,S1 and S2 within normal limits, no S3, no clicks, no rubs, no murmurs, irregular ABD:  Flat, positive bowel sounds normal in frequency in pitch, no bruits, no rebound, no guarding, no midline pulsatile mass, no hepatomegaly, no splenomegaly EXT:  2 plus pulses throughout, no edema, no cyanosis no clubbing SKIN:  No rashes no nodules  EKG:   Atrial fibrillation with rate 113, axis within normal limits, intervals within normal limits, poor anterior R wave progression.  09/01/2014  ASSESSMENT AND PLAN  ATRIAL FIB:   She has atrial fibrillation with rapid rate.  Ms. Srinidhi Landers has a CHA2DS2 - VASc score of 1 with a risk of stroke of 1.3%  I will stop her Xarelto. She will start 81 mg aspirin. We will increase her metoprolol to 100 mg twice a day. In about 2 weeks she should come back for an echocardiogram and a 24-hour Holter to judge rate control.  Of note I think that she has a limited understanding  of her arrhythmia.    TOBACCO ABUSE:  We discussed the need to quit smoking and she says she will try to work on this.  THYROID DISEASE:  Thyroid was normal.

## 2014-09-01 NOTE — Patient Instructions (Signed)
Your physician recommends that you schedule a follow-up appointment in:  6 months with Dr. Percival Spanish  We are ordering an echo for you to get done in 2 weeks  Also in 2 weeks we will place a 24 hr monitor for you to wear  We are increasing you Metoprolol to 100 mg two times a day  Stop taking your Xarelto  Start taking ASA 81 mg daily

## 2014-09-13 ENCOUNTER — Telehealth: Payer: Self-pay | Admitting: Cardiology

## 2014-09-13 NOTE — Telephone Encounter (Signed)
Closed encounter °

## 2014-09-15 ENCOUNTER — Ambulatory Visit: Payer: Medicare Other | Admitting: *Deleted

## 2014-09-15 ENCOUNTER — Ambulatory Visit (HOSPITAL_COMMUNITY)
Admission: RE | Admit: 2014-09-15 | Discharge: 2014-09-15 | Disposition: A | Discharge status: Home / Self Care | Payer: Medicare Other | Source: Ambulatory Visit | Attending: Cardiovascular Disease | Admitting: Cardiovascular Disease

## 2014-09-15 DIAGNOSIS — Z720 Tobacco use: Secondary | ICD-10-CM | POA: Diagnosis not present

## 2014-09-15 DIAGNOSIS — I4891 Unspecified atrial fibrillation: Secondary | ICD-10-CM | POA: Insufficient documentation

## 2014-09-15 NOTE — Patient Instructions (Signed)
Holter Monitoring A Holter monitor is a small device with electrodes (small sticky patches) that attach to your chest. It records the electrical activity of your heart and is worn continuously for 24-48 hours.  A HOLTER MONITOR IS USED TO  Detect heart problems such as:  Heart arrhythmia. Is an abnormal or irregular heartbeat. With some heart arrhythmias, you may not feel or know that you have an irregular heart rhythm.  Palpitations, such as feeling your heart racing or fluttering. It is possible to have heart palpitations and not have a heart arrhythmia.  A heart rhythm that is too slow or too fast.  If you have problems fainting, near fainting or feeling light-headed, a Holter monitor may be worn to see if your heart is the cause. HOLTER MONITOR PREPARATION   Electrodes will be attached to the skin on your chest.  If you have hair on your chest, small areas may have to be shaved. This is done to help the patches stick better and make the recording more accurate.  The electrodes are attached by wires to the Holter monitor. The Holter monitor clips to your clothing. You will wear the monitor at all times, even while exercising and sleeping. HOME CARE INSTRUCTIONS   Wear your monitor at all times.  The wires and the monitor must stay dry. Do not get the monitor wet.  Do not bathe, swim or use a hot tub with it on.  You may do a "sponge" bath while you have the monitor on.  Keep your skin clean, do not put body lotion or moisturizer on your chest.  It's possible that your skin under the electrodes could become irritated. To keep this from happening, you may put the electrodes in slightly different places on your chest.  Your caregiver will also ask you to keep a diary of your activities, such as walking or doing chores. Be sure to note what you are doing if you experience heart symptoms such as palpitations. This will help your caregiver determine what might be contributing to your  symptoms. The information stored in your monitor will be reviewed by your caregiver alongside your diary entries.  Make sure the monitor is safely clipped to your clothing or in a location close to your body that your caregiver recommends.  The monitor and electrodes are removed when the test is over. Return the monitor as directed.  Be sure to follow up with your caregiver and discuss your Holter monitor results. SEEK IMMEDIATE MEDICAL CARE IF:  You faint or feel lightheaded.  You have trouble breathing.  You get pain in your chest, upper arm or jaw.  You feel sick to your stomach and your skin is pale, cool, or damp.  You think something is wrong with the way your heart is beating. MAKE SURE YOU:   Understand these instructions.  Will watch your condition.  Will get help right away if you are not doing well or get worse. Document Released: 03/28/2004 Document Revised: 09/22/2011 Document Reviewed: 08/10/2008 [employer] Patient Information 2015 [employer]. This information is not intended to replace advice given to you by your health care provider. Make sure you discuss any questions you have with your health care provider.  

## 2014-09-15 NOTE — Progress Notes (Signed)
Pt presented for 24 hour holter monitor placement per physician request. Instructions, use, and general questions addressed w/ patient in 1 on 1 consultation w/ this nurse. She voiced understanding of instructions given.

## 2014-09-15 NOTE — Progress Notes (Signed)
2D Echocardiogram Complete.  09/15/2014   Leaann Nevils, RDCS  

## 2014-09-18 ENCOUNTER — Telehealth: Payer: Self-pay | Admitting: Cardiology

## 2014-09-18 NOTE — Telephone Encounter (Signed)
Monitor upload was attempted. No information on memory card.   Left message for patient to return call to set up nurse visit for 24 hour holter or schedule at [address]

## 2014-09-18 NOTE — Telephone Encounter (Signed)
Please call,monitor is not working.

## 2014-09-18 NOTE — Telephone Encounter (Signed)
Patient returned call. She was notified that no data was found on Holter. She is aware that she will need to re-wear the 24 hour holter monitor. Advised that she go to our [employer] office to have the monitor placed, as we have no more Holter monitors.   Will defer to scheduling pool to assist patient in setting up monitor appointment at [facility] 24 hour holter - atrial fibrillation.

## 2014-09-18 NOTE — Telephone Encounter (Signed)
Spoke with patient. She states her monitor is not working - there is a blinking light on the monitor. She states she called [employer] and they tried to help her troubleshoot the monitor.   Patient was advised to bring monitor in for upload, as there is no way to know if information was transmitted. She voiced understanding of the fact that she may need re-wear the monitor.   She will bring monitor in today for upload

## 2014-09-19 ENCOUNTER — Other Ambulatory Visit: Payer: Self-pay | Admitting: *Deleted

## 2014-09-21 ENCOUNTER — Ambulatory Visit (HOSPITAL_COMMUNITY): Payer: Medicare Other

## 2014-09-21 NOTE — Telephone Encounter (Addendum)
monitor  Received: Thereasa Distance, RN  P [facility] Admin Pool            Patient needs an appointment to have a 24 hour HOLTER monitor placed.   The order is in [facility] but has already been released. She is having to re-wear the monitor due to technical issues with first monitor.   Can be scheduled here but [employer] is best as they have more monitors that we do.   Thank you.

## 2014-09-25 ENCOUNTER — Telehealth: Payer: Self-pay | Admitting: *Deleted

## 2014-09-25 NOTE — Telephone Encounter (Signed)
Pt. Brought monitor back on 3/7 but there was a problem with the monitor and the pt. Will have to come back and wear it over again , Rhonda Marshall has been attempting to reach the pt. Without success, we will keep trying

## 2014-09-26 ENCOUNTER — Other Ambulatory Visit: Payer: Self-pay | Admitting: *Deleted

## 2014-09-26 ENCOUNTER — Encounter: Payer: Self-pay | Admitting: *Deleted

## 2014-09-26 ENCOUNTER — Encounter (INDEPENDENT_AMBULATORY_CARE_PROVIDER_SITE_OTHER): Payer: Medicare Other

## 2014-09-26 DIAGNOSIS — I4891 Unspecified atrial fibrillation: Secondary | ICD-10-CM | POA: Diagnosis not present

## 2014-09-26 NOTE — Progress Notes (Signed)
Patient ID: Rhonda Marshall, female   DOB: 03-21-1951, 64 y.o.   MRN: [id number] [facility] 24 hour holter monitor applied to patient.

## 2014-10-05 ENCOUNTER — Telehealth: Payer: Self-pay | Admitting: Cardiology

## 2014-10-05 NOTE — Telephone Encounter (Signed)
Pt would like her monitor and echo results please asap.Marland Kitchen

## 2014-10-05 NOTE — Telephone Encounter (Signed)
Spoke with pt, aware of echo results, monitor is still pending. Follow up scheduled with dr Percival Spanish

## 2014-10-09 ENCOUNTER — Telehealth: Payer: Self-pay | Admitting: *Deleted

## 2014-10-09 NOTE — Telephone Encounter (Signed)
Monitor reviewed by dr hochrein shows atrial fib, rate okay.  pt aware of results

## 2014-10-17 ENCOUNTER — Telehealth: Payer: Self-pay | Admitting: Cardiology

## 2014-10-17 MED ORDER — METOPROLOL TARTRATE 100 MG PO TABS: 100.0000 mg | ORAL_TABLET | Freq: Two times a day (BID) | ORAL | Status: DC

## 2014-10-17 NOTE — Telephone Encounter (Signed)
Returned call to patient she stated she needs refill for metoprolol.Stated Dr.Hochrein increased metoprolol 50 mg 2 tablets twice a day.Advised will send metoprolol 100 mg take 1 tablet twice a day to pharmacy.Stated she wanted a 30 day supply.

## 2014-10-17 NOTE — Telephone Encounter (Signed)
Ms. Backes is calling because she get a 90 day supply of Metoprolol and she takes 4 a day and the 90 day supply is not lasting for the amount in which she takes per day . Please call    Thanks

## 2014-11-06 ENCOUNTER — Telehealth: Payer: Self-pay | Admitting: Cardiology

## 2014-11-06 NOTE — Telephone Encounter (Signed)
Called pt. She reports popped blood vessel in eye, concerned. No symptoms reported. Recent BPs OK. Advised if it gets worse contact PCP/eye doctor.  She voiced understanding.  She has an appt w/ Dr. Percival Spanish on Friday. Advised if any other concerns call or f/u at appt. Pt verbalized agreement w/ plan.

## 2014-11-06 NOTE — Telephone Encounter (Signed)
Mrs. Rhonda Marshall is calling because [name] has not been feeling well , light headed , tired and a lot of dark red blood in her eye . Please call   Thanks

## 2014-11-10 ENCOUNTER — Ambulatory Visit (INDEPENDENT_AMBULATORY_CARE_PROVIDER_SITE_OTHER): Payer: Medicare Other | Admitting: Cardiology

## 2014-11-10 ENCOUNTER — Encounter: Payer: Self-pay | Admitting: Cardiology

## 2014-11-10 VITALS — BP 110/80 | HR 96 | Ht 61.0 in | Wt 126.6 lb

## 2014-11-10 DIAGNOSIS — I481 Persistent atrial fibrillation: Secondary | ICD-10-CM

## 2014-11-10 DIAGNOSIS — I4819 Other persistent atrial fibrillation: Secondary | ICD-10-CM

## 2014-11-10 NOTE — Progress Notes (Signed)
   HPI The patient presents for evaluation of atrial fibrillation. She had rapid rate and is now status post DCCV.  She is now back in atrial fib.  Her rate however is slower.  She actually is not noticing her fibrillation. She does notice any palpitations. She has no presyncope or syncope. She denies any chest pressure, neck or arm discomfort. She has no weight gain or edema. She has never really known that she is fibrillation.  She remains active.  Allergies  Allergen Reactions  . Clindamycin/Lincomycin Rash  . Latex Rash    Latex gloves    Current Outpatient Prescriptions  Medication Sig Dispense Refill  . ALPRAZolam (XANAX) 1 MG tablet Take 1 mg by mouth 3 (three) times daily as needed for anxiety.   0  . aspirin 81 MG tablet Take 81 mg by mouth daily.    . citalopram (CELEXA) 10 MG tablet Take 10 mg by mouth daily as needed (depression).   6  . metoprolol (LOPRESSOR) 100 MG tablet Take 1 tablet (100 mg total) by mouth 2 (two) times daily. 60 tablet 6  . oxyCODONE-acetaminophen (PERCOCET/ROXICET) 5-325 MG per tablet Take 1 tablet by mouth every 4 (four) hours as needed for severe pain.      No current facility-administered medications for this visit.    Past Medical History  Diagnosis Date  . Atrial fibrillation     newly diagnosed 10/2010  . Rheumatoid arthritis(714.0)   . Subclinical hyperthyroidism   . Anxiety   . Family history of adverse reaction to anesthesia     Sister's BP drops     Past Surgical History  Procedure Laterality Date  . Tubal ligation    . Arm surgery      fracture, right arm  . Cataract extraction    . Cardioversion N/A 08/10/2014    Procedure: CARDIOVERSION;  Surgeon: Dorothy Spark, MD;  Location: [facility] ENDOSCOPY;  Service: Cardiovascular;  Laterality: N/A;    ROS:  As stated in the HPI and negative for all other systems.  PHYSICAL EXAM BP 110/80 mmHg  Pulse 96  Ht 5\' 1"  (1.549 m)  Wt 126 lb 9.6 oz (57.425 kg)  BMI 23.93 kg/m2  GENERAL:   Well appearing NECK:  No jugular venous distention, waveform within normal limits, carotid upstroke brisk and symmetric, no bruits, no thyromegaly LUNGS:  Clear to auscultation bilaterally BACK:  No CVA tenderness CHEST:  Unremarkable HEART:  PMI not displaced or sustained,S1 and S2 within normal limits, no S3, no clicks, no rubs, no murmurs, irregular ABD:  Flat, positive bowel sounds normal in frequency in pitch, no bruits, no rebound, no guarding, no midline pulsatile mass, no hepatomegaly, no splenomegaly EXT:  2 plus pulses throughout, no edema, no cyanosis no clubbing    ASSESSMENT AND PLAN  ATRIAL FIB:   She has atrial fibrillation..  Ms. Lizbeth Feijoo has a CHA2DS2 - VASc score of 1 with a risk of stroke of 1.3%  She did not maintain sinus rhythm after cardioversion. She doesn't feel her fibrillation. We will continue with rate control and  81 mg aspirin. She had good rate control on her recent Holter monitor.  TOBACCO ABUSE:  We discussed the need to quit smoking and she says she will try to work on this.  THYROID DISEASE:  Thyroid was normal.

## 2014-11-10 NOTE — Patient Instructions (Signed)
Your physician wants you to follow-up in: ONE YEAR You will receive a reminder letter in the mail two months in advance. If you don't receive a letter, please call our office to schedule the follow-up appointment.  

## 2015-04-19 ENCOUNTER — Other Ambulatory Visit: Payer: Self-pay | Admitting: Cardiology

## 2015-04-19 MED ORDER — METOPROLOL TARTRATE 100 MG PO TABS: 100.0000 mg | ORAL_TABLET | Freq: Two times a day (BID) | ORAL | Status: DC

## 2015-05-30 ENCOUNTER — Ambulatory Visit (INDEPENDENT_AMBULATORY_CARE_PROVIDER_SITE_OTHER): Payer: Medicare Other | Admitting: Family Medicine

## 2015-05-30 ENCOUNTER — Encounter: Payer: Self-pay | Admitting: Family Medicine

## 2015-05-30 ENCOUNTER — Ambulatory Visit (HOSPITAL_BASED_OUTPATIENT_CLINIC_OR_DEPARTMENT_OTHER)
Admission: RE | Admit: 2015-05-30 | Discharge: 2015-05-30 | Disposition: A | Discharge status: Home / Self Care | Payer: Medicare Other | Source: Ambulatory Visit | Attending: Family Medicine | Admitting: Family Medicine

## 2015-05-30 VITALS — BP 130/75 | HR 63 | Ht 62.0 in | Wt 125.0 lb

## 2015-05-30 DIAGNOSIS — M25561 Pain in right knee: Secondary | ICD-10-CM

## 2015-05-30 DIAGNOSIS — F17200 Nicotine dependence, unspecified, uncomplicated: Secondary | ICD-10-CM | POA: Diagnosis not present

## 2015-05-30 DIAGNOSIS — R2241 Localized swelling, mass and lump, right lower limb: Secondary | ICD-10-CM | POA: Insufficient documentation

## 2015-05-30 MED ORDER — METHYLPREDNISOLONE ACETATE 40 MG/ML IJ SUSP
40.0000 mg | Freq: Once | INTRAMUSCULAR | Status: AC
  Administered 2015-05-30: 40 mg via INTRA_ARTICULAR

## 2015-05-31 DIAGNOSIS — M25561 Pain in right knee: Secondary | ICD-10-CM | POA: Insufficient documentation

## 2015-05-31 NOTE — Assessment & Plan Note (Signed)
with bakers cyst confirmed by ultrasound and doppler.  No DVT by doppler.  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  Discussed options and went ahead with intraarticular injection today.  Discussed icing, elevation, compression.  Call us if not improving as expected over the next couple weeks.  After informed written consent, patient was lying supine on exam table. Right knee was prepped with alcohol swab and utilizing superolateral approach with ultrasound guidance, patient's right knee was injected intraarticularly with 3:1 marcaine: depomedrol. Patient tolerated the procedure well without immediate complications.

## 2015-05-31 NOTE — Progress Notes (Signed)
PCP: Tamsen Roers, MD  Subjective:   HPI: Patient is a 64 y.o. female here for right knee pain.  Patient reports she's had 4 months of right knee pain. Started anterior with swelling and now mainly bothering her posterior knee, constant and dull. Pain level 7/10. Tried prednisone without much benefit. No skin changes, fever, other complaints.  Past Medical History  Diagnosis Date  . Atrial fibrillation ([place])     newly diagnosed 10/2010  . Rheumatoid arthritis(714.0)   . Subclinical hyperthyroidism   . Anxiety   . Family history of adverse reaction to anesthesia     Sister's BP drops     Current Outpatient Prescriptions on File Prior to Visit  Medication Sig Dispense Refill  . ALPRAZolam (XANAX) 1 MG tablet Take 1 mg by mouth 3 (three) times daily as needed for anxiety.   0  . aspirin 81 MG tablet Take 81 mg by mouth daily.    . citalopram (CELEXA) 10 MG tablet Take 10 mg by mouth daily as needed (depression).   6  . metoprolol (LOPRESSOR) 100 MG tablet Take 1 tablet (100 mg total) by mouth 2 (two) times daily. 60 tablet 4  . oxyCODONE-acetaminophen (PERCOCET/ROXICET) 5-325 MG per tablet Take 1 tablet by mouth every 4 (four) hours as needed for severe pain.      No current facility-administered medications on file prior to visit.    Past Surgical History  Procedure Laterality Date  . Tubal ligation    . Arm surgery      fracture, right arm  . Cataract extraction    . Cardioversion N/A 08/10/2014    Procedure: CARDIOVERSION;  Surgeon: Dorothy Spark, MD;  Location: [facility] ENDOSCOPY;  Service: Cardiovascular;  Laterality: N/A;    Allergies  Allergen Reactions  . Clindamycin/Lincomycin Rash  . Latex Rash    Latex gloves    Social History   Social History  . Marital Status: Widowed    Spouse Name: N/A  . Number of Children: 1  . 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 Smoking status:  Current Every Day Smoker    Types: Cigarettes  . Smokeless tobacco: Not on file     Comment: 7 cigs daily  . Alcohol Use: 0.0 oz/week    0 Standard drinks or equivalent per week     Comment: Rarely  . Drug Use: No  . Sexual Activity: Not on file   Other Topics Concern  . Not on file   Social History Narrative    Family History  Problem Relation Age of Onset  . Heart attack Mother 83    pacemaker age 35  . Cancer Father     colon and prostate  . Diabetes Brother   . CAD Brother 48  . Cancer Brother     Renal    BP 130/75 mmHg  Pulse 63  Ht 5\' 2"  (1.575 m)  Wt 125 lb (56.7 kg)  BMI 22.86 kg/m2  Review of Systems: See HPI above.    Objective:  Physical Exam:  Gen: NAD  Right knee: Small effusion.  Large bakers cyst behind knee.  No bruising, other deformity. TTP bakers cyst, less medial joint line.  No other tenderness. FROM. Negative ant/post drawers. Negative valgus/varus testing. Negative lachmanns. Negative mcmurrays, apleys, patellar apprehension. NV intact distally.  Left knee: FROM without pain.    Assessment & Plan:  1.  Right knee pain - with bakers cyst confirmed by ultrasound and doppler.  No DVT by doppler.  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  Discussed options and went ahead with intraarticular injection today.  Discussed icing, elevation, compression.  Call us if not improving as expected over the next couple weeks.  After informed written consent, patient was lying supine on exam table. Right knee was prepped with alcohol swab and utilizing superolateral approach with ultrasound guidance, patient's right knee was injected intraarticularly with 3:1 marcaine: depomedrol. Patient tolerated the procedure well without immediate complications.

## 2015-07-25 ENCOUNTER — Ambulatory Visit: Payer: Medicare Other | Admitting: Physician Assistant

## 2015-07-30 ENCOUNTER — Ambulatory Visit (INDEPENDENT_AMBULATORY_CARE_PROVIDER_SITE_OTHER): Payer: Medicare Other | Admitting: Cardiology

## 2015-07-30 ENCOUNTER — Encounter: Payer: Self-pay | Admitting: Cardiology

## 2015-07-30 VITALS — BP 134/92 | HR 114 | Ht 61.5 in | Wt 125.2 lb

## 2015-07-30 DIAGNOSIS — I4891 Unspecified atrial fibrillation: Secondary | ICD-10-CM

## 2015-07-30 DIAGNOSIS — R0609 Other forms of dyspnea: Secondary | ICD-10-CM

## 2015-07-30 DIAGNOSIS — F17200 Nicotine dependence, unspecified, uncomplicated: Secondary | ICD-10-CM

## 2015-07-30 DIAGNOSIS — I5022 Chronic systolic (congestive) heart failure: Secondary | ICD-10-CM | POA: Insufficient documentation

## 2015-07-30 DIAGNOSIS — I429 Cardiomyopathy, unspecified: Secondary | ICD-10-CM | POA: Diagnosis not present

## 2015-07-30 DIAGNOSIS — R0600 Dyspnea, unspecified: Secondary | ICD-10-CM | POA: Insufficient documentation

## 2015-07-30 DIAGNOSIS — Z720 Tobacco use: Secondary | ICD-10-CM | POA: Diagnosis not present

## 2015-07-30 DIAGNOSIS — I428 Other cardiomyopathies: Secondary | ICD-10-CM

## 2015-07-30 MED ORDER — METOPROLOL TARTRATE 100 MG PO TABS: 100.0000 mg | ORAL_TABLET | Freq: Two times a day (BID) | ORAL | Status: DC

## 2015-07-30 NOTE — Assessment & Plan Note (Signed)
Pt here today with her son with main complaint of DOE

## 2015-07-30 NOTE — Assessment & Plan Note (Signed)
EF 40-45% by echo March 2016- felt to be secondary to AF No CHF on exam

## 2015-07-30 NOTE — Assessment & Plan Note (Addendum)
CHADs Vasc =1 per Dr Percival Spanish  Permanent AF- VR 110 on Metoprolol 100 mg BID

## 2015-07-30 NOTE — Assessment & Plan Note (Signed)
Living with her son, she has cut back

## 2015-07-30 NOTE — Assessment & Plan Note (Signed)
Bakers cyst on exam

## 2015-07-30 NOTE — Patient Instructions (Signed)
Your physician has recommended you make the following change in your medication: CONTINUE METOPROLOL 100MG  TWICE DAILY  Your physician recommends that you schedule a follow-up appointment in: [place] DR. HOCHREIN  IF YOU HAVE INCREASING SHORTNESS OF BREATH PLEASE CALL OUR OFFICE.  [phone]

## 2015-07-30 NOTE — Progress Notes (Signed)
07/30/2015 Rhonda Marshall   Oct 24, 1950  [id number]  Primary Physician Tamsen Roers, MD Primary Cardiologist: Dr Percival Spanish  HPI:  65 y/o female with CAF, CHADs VASc=1, on ASA and metoprolol. Her last echo March 2016 showed her EF to be 40-45%, felt to be secondary to AF. 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 Around this time her family became concerned because she appeared dyspneic with any exertion. The pt saw her PCP a few days ago who apparently cut her Metoprolol back to 50 mg BID. The pt says she never decreased the dose (Dr Percival Spanish has had her on 100 mg BID).  Her HR in the office today is 114.  She is somewhat of a rambling historian, fortunately her son accompanied her and helped with recent history. It does not sound she has had CHF. Her dyspnea was mainly around the time of her sons death. She feels like she is getting better.    Current Outpatient Prescriptions  Medication Sig Dispense Refill  . ALPRAZolam (XANAX) 1 MG tablet Take 1 mg by mouth 3 (three) times daily as needed for anxiety.   0  . aspirin 81 MG tablet Take 81 mg by mouth daily.    . metoprolol (LOPRESSOR) 100 MG tablet Take 1 tablet (100 mg total) by mouth 2 (two) times daily. 60 tablet 4  . oxyCODONE-acetaminophen (PERCOCET/ROXICET) 5-325 MG per tablet Take 1 tablet by mouth every 4 (four) hours as needed for severe pain.      No current facility-administered medications for this visit.    Allergies  Allergen Reactions  . Clindamycin/Lincomycin Rash  . Latex Rash    Latex gloves    Social History   Social History  . Marital Status: Widowed    Spouse Name: N/A  . Number of Children: 1  . 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 Smoking status: Current Every Day Smoker    Types: Cigarettes  . Smokeless tobacco: Not on file     Comment: 7 cigs daily  . Alcohol Use: 0.0 oz/week    0 Standard  drinks or equivalent per week     Comment: Rarely  . Drug Use: No  . Sexual Activity: Not on file   Other Topics Concern  . Not on file   Social History Narrative     Review of Systems: General: negative for chills, fever, night sweats or weight changes.  Cardiovascular: negative for chest pain, dyspnea on exertion, edema, orthopnea, palpitations, paroxysmal nocturnal dyspnea or shortness of breath Dermatological: negative for rash Respiratory: negative for cough or wheezing Urologic: negative for hematuria Abdominal: negative for nausea, vomiting, diarrhea, bright red blood per rectum, melena, or hematemesis Neurologic: negative for visual changes, syncope, or dizziness All other systems reviewed and are otherwise negative except as noted above.    Blood pressure 134/92, pulse 114, height 5' 1.5" (1.562 m), weight 125 lb 3.2 oz (56.79 kg).  General appearance: alert, cooperative and no distress Lungs: decreased no wheezing or rales Heart: irregularly irregular rhythm Extremities: no edema, Bakers cyst behind Rt knee Skin: pale cool dry Neurologic: Grossly normal  EKG AF with VR 114  ASSESSMENT AND PLAN:   Dyspnea on exertion Pt here today with her son with main complaint of DOE  A-fib [facility])  CHADs Vasc =1 per Dr Percival Spanish  Permanent AF- VR 110 on Metoprolol 100  mg BID  Right knee pain Bakers cyst on exam  NICM (nonischemic cardiomyopathy) ([place]) EF 40-45% by echo March 2016- felt to be secondary to AF No CHF on exam  Smoker Living with her son, she has cut back   PLAN  I encouraged her to continue Metoprolol 100 mg BID. She will call if her dyspnea does not continue to improve or gets worse.   Kerin Ransom K PA-C 07/30/2015 3:21 PM

## 2015-11-12 NOTE — Progress Notes (Signed)
   HPI The patient presents for evaluation of atrial fibrillation. She had DCCV.  She went back in atrial fib.  Her rate however is slower.  She actually is not noticing her fibrillation. She does notice any palpitations. She has no presyncope or syncope. She denies any chest pressure, neck or arm discomfort. She has no weight gain or edema. She has never really known that she is fibrillation.  Unfortunately her son died since I last saw her.  She has been grieving.   Current Outpatient Prescriptions  Medication Sig Dispense Refill  . ALPRAZolam (XANAX) 1 MG tablet Take 1 mg by mouth 3 (three) times daily as needed for anxiety.   0  . aspirin 81 MG tablet Take 81 mg by mouth daily.    . metoprolol (LOPRESSOR) 100 MG tablet Take 1 tablet (100 mg total) by mouth 2 (two) times daily. 60 tablet 4  . oxyCODONE-acetaminophen (PERCOCET/ROXICET) 5-325 MG per tablet Take 1 tablet by mouth every 4 (four) hours as needed for severe pain.      No current facility-administered medications for this visit.    Past Medical History  Diagnosis Date  . Atrial fibrillation ([place])     newly diagnosed 10/2010  . Rheumatoid arthritis(714.0)   . Subclinical hyperthyroidism   . Anxiety   . Family history of adverse reaction to anesthesia     Sister's BP drops     Past Surgical History  Procedure Laterality Date  . Tubal ligation    . Arm surgery      fracture, right arm  . Cataract extraction    . Cardioversion N/A 08/10/2014    Procedure: CARDIOVERSION;  Surgeon: Dorothy Spark, MD;  Location: [facility] ENDOSCOPY;  Service: Cardiovascular;  Laterality: N/A;    ROS:  As stated in the HPI and negative for all other systems.  PHYSICAL EXAM BP 104/74 mmHg  Pulse 82  Ht 5' 1.5" (1.562 m)  Wt 124 lb 4 oz (56.359 kg)  BMI 23.10 kg/m2  GENERAL:  Well appearing NECK:  No jugular venous distention, waveform within normal limits, carotid upstroke brisk and symmetric, no bruits, no thyromegaly LUNGS:  Clear to  auscultation bilaterally BACK:  No CVA tenderness CHEST:  Unremarkable HEART:  PMI not displaced or sustained,S1 and S2 within normal limits, no S3, no clicks, no rubs, no murmurs, irregular ABD:  Flat, positive bowel sounds normal in frequency in pitch, no bruits, no rebound, no guarding, no midline pulsatile mass, no hepatomegaly, no splenomegaly EXT:  2 plus pulses throughout, no edema, no cyanosis no clubbing  EKG:  Atrial fibrillation, rate 106, aberrant conduction, RAD , intervals within normal limits, poor anterior R wave progression.  ASSESSMENT AND PLAN  ATRIAL FIB:   She has atrial fibrillation..  Ms. Tangie Crawford has a CHA2DS2 - VASc score of 1 with a risk of stroke of 1.3%  She did not maintain sinus rhythm after cardioversion. She doesn't feel her fibrillation. We will continue with rate control and  81 mg aspirin. She had good rate control on her recent Holter monitor.  TOBACCO ABUSE:  We discussed the need to quit smoking and she says she will try to work on this.  She says that she is smoking only rarely.

## 2015-11-13 ENCOUNTER — Ambulatory Visit (INDEPENDENT_AMBULATORY_CARE_PROVIDER_SITE_OTHER): Payer: Medicare Other | Admitting: Cardiology

## 2015-11-13 ENCOUNTER — Encounter: Payer: Self-pay | Admitting: Cardiology

## 2015-11-13 VITALS — BP 104/74 | HR 82 | Ht 61.5 in | Wt 124.2 lb

## 2015-11-13 DIAGNOSIS — I4819 Other persistent atrial fibrillation: Secondary | ICD-10-CM

## 2015-11-13 DIAGNOSIS — I481 Persistent atrial fibrillation: Secondary | ICD-10-CM

## 2015-11-13 NOTE — Patient Instructions (Signed)
Medication Instructions:  NO CHANGES  Labwork: NONE  Testing/Procedures: NONE  Follow-Up: In 1 YEAR WITH DR [facility]  Any Other Special Instructions Will Be Listed Below (If Applicable).     If you need a refill on your cardiac medications before your next appointment, please call your pharmacy.

## 2016-02-05 ENCOUNTER — Other Ambulatory Visit: Payer: Self-pay | Admitting: *Deleted

## 2016-02-05 MED ORDER — METOPROLOL TARTRATE 100 MG PO TABS
100.0000 mg | ORAL_TABLET | Freq: Two times a day (BID) | ORAL | 11 refills | Status: DC
Start: 2016-02-05 — End: 2016-12-01

## 2016-06-10 ENCOUNTER — Other Ambulatory Visit: Payer: Self-pay | Admitting: Obstetrics and Gynecology

## 2016-06-11 LAB — CYTOLOGY - PAP

## 2016-11-12 NOTE — Progress Notes (Signed)
HPI The patient presents for evaluation of atrial fibrillation. She had DCCV.  She went back in atrial fib.  Her rate however was slower.  She actually was not noticing her fibrillation. Since I last saw her she was given a prescription for PTU apparently for hyperthyroidism but she hasn't started this. She wants to see an endocrinologist. She feels relatively well.  She only has rapid heart rate occasionally if she's moving very quickly. She otherwise denies any cardiovascular symptoms.  The patient denies any new symptoms such as chest discomfort, neck or arm discomfort. There has been no new shortness of breath, PND or orthopnea. There have been no reported palpitations, presyncope or syncope.  Current Outpatient Prescriptions  Medication Sig Dispense Refill  . ALPRAZolam (XANAX) 1 MG tablet Take 1 mg by mouth 3 (three) times daily as needed for anxiety.   0  . aspirin 81 MG tablet Take 81 mg by mouth daily.    . metoprolol (LOPRESSOR) 100 MG tablet Take 1 tablet (100 mg total) by mouth 2 (two) times daily. 60 tablet 11  . oxyCODONE-acetaminophen (PERCOCET/ROXICET) 5-325 MG per tablet Take 1 tablet by mouth every 4 (four) hours as needed for severe pain.     . predniSONE (DELTASONE) 5 MG tablet Take 1 tablet (5 mg total) by mouth every other 2 days.    Marland Kitchen apixaban (ELIQUIS) 5 MG TABS tablet Take 1 tablet (5 mg total) by mouth 2 (two) times daily. 60 tablet 11   No current facility-administered medications for this visit.     Past Medical History:  Diagnosis Date  . Anxiety   . Atrial fibrillation ([place])    newly diagnosed 10/2010  . Family history of adverse reaction to anesthesia    Sister's BP drops   . Rheumatoid arthritis(714.0)   . Subclinical hyperthyroidism     Past Surgical History:  Procedure Laterality Date  . Arm surgery     fracture, right arm  . CARDIOVERSION N/A 08/10/2014   Procedure: CARDIOVERSION;  Surgeon: Dorothy Spark, MD;  Location: [place];  Service:  Cardiovascular;  Laterality: N/A;  . CATARACT EXTRACTION    . TUBAL LIGATION      ROS:   As stated in the HPI and negative for all other systems.  PHYSICAL EXAM BP 118/78   Pulse (!) 123   Ht 5' 1.5" (1.562 m)   Wt 131 lb 6.4 oz (59.6 kg)   BMI 24.43 kg/m   GENERAL:  Well appearing and in no acute  distress NECK:  No jugular venous distention, waveform within normal limits, carotid upstroke brisk and symmetric, no bruits, no thyromegaly LUNGS:  Clear to auscultation bilaterally BACK:  No CVA tenderness CHEST:  Unremarkable HEART:  PMI not displaced or sustained,S1 and S2 within normal limits, no S3, no clicks, no rubs, no murmurs, irregular ABD:  Flat, positive bowel sounds normal in frequency in pitch, no bruits, no rebound, no guarding, no midline pulsatile mass, no hepatomegaly, no splenomegaly EXT:  2 plus pulses throughout, no edema, no cyanosis no clubbing, arthritis changes.    EKG:  Atrial fibrillation, rate 123 interventricular conduction delay, , RAD , intervals within normal limits, poor anterior R wave progression.  ASSESSMENT AND PLAN  ATRIAL FIB:     [name] has a CHA2DS2 - VASc score of 2.  I will start Eliquis.  She has no contraindications to this.  She will stop ASA.  She is going to wear a 24 hour Holter  TOBACCO ABUSE:  She understands the need to quit smoking.    HYPERTHYROID:  I reviewed the recent records from [facility], [facility].  He has wanted her to take PTU.  However, she has not wanted to do this.  She is going to see her endocrinologist.

## 2016-11-13 ENCOUNTER — Encounter: Payer: Self-pay | Admitting: Cardiology

## 2016-11-13 ENCOUNTER — Ambulatory Visit (INDEPENDENT_AMBULATORY_CARE_PROVIDER_SITE_OTHER): Payer: Medicare Other | Admitting: Cardiology

## 2016-11-13 VITALS — BP 118/78 | HR 123 | Ht 61.5 in | Wt 131.4 lb

## 2016-11-13 DIAGNOSIS — E0590 Thyrotoxicosis, unspecified without thyrotoxic crisis or storm: Secondary | ICD-10-CM | POA: Diagnosis not present

## 2016-11-13 DIAGNOSIS — I482 Chronic atrial fibrillation: Secondary | ICD-10-CM

## 2016-11-13 DIAGNOSIS — Z720 Tobacco use: Secondary | ICD-10-CM

## 2016-11-13 DIAGNOSIS — I4821 Permanent atrial fibrillation: Secondary | ICD-10-CM

## 2016-11-13 MED ORDER — APIXABAN 5 MG PO TABS
5.0000 mg | ORAL_TABLET | Freq: Two times a day (BID) | ORAL | 11 refills | Status: DC
Start: 2016-11-13 — End: 2017-09-09

## 2016-11-13 NOTE — Patient Instructions (Signed)
Dr Percival Spanish has recommended making the following medication changes: 1. START Eliquis 5 mg - take 1 tablet by mouth twice daily  Your physician has recommended that you wear a holter monitor. Holter monitors are medical devices that record the heart's electrical activity. Doctors most often use these monitors to diagnose arrhythmias. Arrhythmias are problems with the speed or rhythm of the heartbeat. The monitor is a small, portable device. You can wear one while you do your normal daily activities. This is usually used to diagnose what is causing palpitations/syncope (passing out).  Dr Percival Spanish recommends that you schedule a follow-up appointment in first available.

## 2016-11-19 ENCOUNTER — Telehealth: Payer: Self-pay | Admitting: Cardiology

## 2016-11-19 DIAGNOSIS — E0590 Thyrotoxicosis, unspecified without thyrotoxic crisis or storm: Secondary | ICD-10-CM

## 2016-11-19 NOTE — Telephone Encounter (Signed)
Yes

## 2016-11-19 NOTE — Telephone Encounter (Signed)
New message     Pt needs referral to Dr Elayne Snare [phone]

## 2016-11-19 NOTE — Telephone Encounter (Signed)
Spoke with pt she states that Dr Dwyane Dee is an endocrine and states that she need a referral for her thyroid. Ok to refer?

## 2016-11-19 NOTE — Telephone Encounter (Signed)
Referral entered. Pt notified.

## 2016-11-20 ENCOUNTER — Telehealth: Payer: Self-pay | Admitting: Cardiology

## 2016-11-20 NOTE — Telephone Encounter (Signed)
Called Dr. Ronnie Derby office inquiring as to appointment for the patient.  They have it on the work cue, but, have not called the patient to schedule yet.

## 2016-11-25 ENCOUNTER — Ambulatory Visit (INDEPENDENT_AMBULATORY_CARE_PROVIDER_SITE_OTHER): Payer: Medicare Other

## 2016-11-25 DIAGNOSIS — I482 Chronic atrial fibrillation: Secondary | ICD-10-CM | POA: Diagnosis not present

## 2016-11-25 DIAGNOSIS — I4821 Permanent atrial fibrillation: Secondary | ICD-10-CM

## 2016-11-30 NOTE — Progress Notes (Signed)
HPI The patient presents for evaluation of atrial fibrillation. She had DCCV in the past but she went back into atrial fib.  Her rate however was slower.  She actually was not noticing her fibrillation.  After the last visit I sent her back to Dr. Dwyane Dee to address her hyperthyroidism.  She is on Eliquis.   She doesn't have her appointment scheduled until mid-June. She's not yet started the PTU which was prescribed. She notices her heart rate only when she goes out the exercise. She's not describing any palpitations otherwise at rest. She's not having any presyncope or syncope. She has no new shortness of breath, PND or orthopnea. She's had no chest pressure, neck or arm discomfort. She unfortunately still smoking cigarettes.   Current Outpatient Prescriptions  Medication Sig Dispense Refill  . ALPRAZolam (XANAX) 1 MG tablet Take 1 mg by mouth 3 (three) times daily as needed for anxiety.   0  . apixaban (ELIQUIS) 5 MG TABS tablet Take 1 tablet (5 mg total) by mouth 2 (two) times daily. 60 tablet 11  . aspirin 81 MG tablet Take 81 mg by mouth daily.    . metoprolol tartrate (LOPRESSOR) 100 MG tablet Take 150 mg in the morning and 100 mg at night 75 tablet 11  . oxyCODONE-acetaminophen (PERCOCET/ROXICET) 5-325 MG per tablet Take 1 tablet by mouth every 4 (four) hours as needed for severe pain.      No current facility-administered medications for this visit.     Past Medical History:  Diagnosis Date  . Anxiety   . Atrial fibrillation ([place])    newly diagnosed 10/2010  . Family history of adverse reaction to anesthesia    Sister's BP drops   . Rheumatoid arthritis(714.0)   . Subclinical hyperthyroidism     Past Surgical History:  Procedure Laterality Date  . Arm surgery     fracture, right arm  . CARDIOVERSION N/A 08/10/2014   Procedure: CARDIOVERSION;  Surgeon: Dorothy Spark, MD;  Location: [facility];  Service: Cardiovascular;  Laterality: N/A;  . CATARACT EXTRACTION    . TUBAL  LIGATION      ROS:   As stated in the HPI and negative for all other systems.  PHYSICAL EXAM BP 112/70 (BP Location: Left Arm, Patient Position: Sitting, Cuff Size: Normal)   Pulse (!) 128   Ht 5' 1.5" (1.562 m)   Wt 132 lb (59.9 kg)   BMI 24.54 kg/m   GENERAL:  Well appearing NECK:  No jugular venous distention, waveform within normal limits, carotid upstroke brisk and symmetric, no bruits, no thyromegaly LYMPHATICS:  No cervical, inguinal adenopathy LUNGS:  Clear to auscultation bilaterally BACK:  No CVA tenderness CHEST:  Unremarkable HEART:  PMI not displaced or sustained,S1 and S2 within normal limits, no S3, no, no clicks, no rubs, no murmurs, irregular ABD:  Flat, positive bowel sounds normal in frequency in pitch, no bruits, no rebound, no guarding, no midline pulsatile mass, no hepatomegaly, no splenomegaly EXT:  2 plus pulses throughout, no edema, no cyanosis no clubbing   EKG:  Atrial fibrillation, rate 128  interventricular conduction delay, , RAD , intervals within normal limits, poor anterior R wave progression.  ASSESSMENT AND PLAN  ATRIAL FIB:     [name] has a CHA2DS2 - VASc score of 2.  She is tolerating Eliquis.  I will increase the metoprolol by adding another 50 mg qam.    TOBACCO ABUSE:    She understands the need to  quit smoking.    HYPERTHYROID:    I reviewed the recent records from Tamsen Roers, MD.  He has wanted her to take PTU.  However, she has not wanted to do this.  At the last visit I explained to her that she needs to get this treated    CARDIOMYOPATHY:  I will repeat an echo when we have her thyroid and her BP controlled.  She seems to be euvolemic

## 2016-12-01 ENCOUNTER — Encounter: Payer: Self-pay | Admitting: Cardiology

## 2016-12-01 ENCOUNTER — Ambulatory Visit (INDEPENDENT_AMBULATORY_CARE_PROVIDER_SITE_OTHER): Payer: Medicare Other | Admitting: Cardiology

## 2016-12-01 VITALS — BP 112/70 | HR 128 | Ht 61.5 in | Wt 132.0 lb

## 2016-12-01 DIAGNOSIS — I428 Other cardiomyopathies: Secondary | ICD-10-CM

## 2016-12-01 DIAGNOSIS — I482 Chronic atrial fibrillation: Secondary | ICD-10-CM | POA: Diagnosis not present

## 2016-12-01 DIAGNOSIS — E0590 Thyrotoxicosis, unspecified without thyrotoxic crisis or storm: Secondary | ICD-10-CM

## 2016-12-01 DIAGNOSIS — I4821 Permanent atrial fibrillation: Secondary | ICD-10-CM

## 2016-12-01 MED ORDER — METOPROLOL TARTRATE 100 MG PO TABS: ORAL_TABLET | ORAL | 11 refills | Status: DC

## 2016-12-01 NOTE — Patient Instructions (Signed)
Medication Instructions:  INCREASE- Metoprolol 150 mg (1 1/2 tablets) in the morning and 100 mg (1 tablets) at night  Labwork: None Ordered  Testing/Procedures: None Ordered  Follow-Up: Your physician recommends that you schedule a follow-up appointment in: 2 Months   Any Other Special Instructions Will Be Listed Below (If Applicable).   If you need a refill on your cardiac medications before your next appointment, please call your pharmacy.

## 2016-12-23 NOTE — Progress Notes (Signed)
Patient ID: Rhonda Marshall, female   DOB: [birth year], 66 y.o.   MRN: [id number]                                                                                                                Reason for Appointment:  Hyperthyroidism, new consultation    Chief complaint: Check thyroid   History of Present Illness:   Review of her records for thyroid History indicates that she reportedly had subclinical hyperthyroidism in 2012 when she was admitted for new onset atrial fibrillation Subsequently did not have any treatment or further evaluation  She has been treated for atrial fibrillation by her cardiologist and was recommended evaluation for the thyroid recently  The patient is a poor historian and she does not have any clear-cut symptoms of palpitations, shakiness, feeling excessively warm and sweaty or weight loss.  She does complain of nervousness, and fatigue.  However she has also had issues with anxiety and depression   She was tested by her PCP last October when with a TSH of 0.02 she was prescribed PTU She is not clear why she did not start this medication but she was afraid it would affect her heart  However free T4 or free T3 levels were not done No labs are available recently  Wt Readings from Last 3 Encounters:  12/24/16 133 lb 3.2 oz (60.4 kg)  12/01/16 132 lb (59.9 kg)  11/13/16 131 lb 6.4 oz (59.6 kg)      Thyroid function tests as follows:     Lab Results  Component Value Date   TSH 0.359 05/23/2014   TSH 0.087 (L) 11/06/2010    No results found for: THYROTRECAB   Allergies as of 12/24/2016      Reactions   Clindamycin/lincomycin Rash   Latex Rash   Latex gloves      Medication List       Accurate as of 12/24/16 11:33 AM. Always use your most recent med list.          ALPRAZolam 1 MG tablet Commonly known as:  XANAX Take 1 mg by mouth 3 (three) times daily as needed for anxiety.   apixaban 5 MG Tabs tablet Commonly known as:  ELIQUIS Take 1  tablet (5 mg total) by mouth 2 (two) times daily.   aspirin 81 MG tablet Take 81 mg by mouth daily.   metoprolol tartrate 100 MG tablet Commonly known as:  LOPRESSOR Take 150 mg in the morning and 100 mg at night   oxyCODONE-acetaminophen 5-325 MG tablet Commonly known as:  PERCOCET/ROXICET Take 1 tablet by mouth every 4 (four) hours as needed for severe pain.           Past Medical History:  Diagnosis Date  . Anxiety   . Atrial fibrillation ([place])    newly diagnosed 10/2010  . Family history of adverse reaction to anesthesia    Sister's BP drops   . Rheumatoid arthritis(714.0)   . Subclinical hyperthyroidism     Past Surgical History:  Procedure Laterality Date  . Arm surgery     fracture, right arm  . CARDIOVERSION N/A 08/10/2014   Procedure: CARDIOVERSION;  Surgeon: Dorothy Spark, MD;  Location: [facility];  Service: Cardiovascular;  Laterality: N/A;  . CATARACT EXTRACTION    . TUBAL LIGATION      Family History  Problem Relation Age of Onset  . Heart attack Mother 26       pacemaker age 26  . Cancer Father        colon and prostate  . Diabetes Brother   . CAD Brother 51  . Cancer Brother        Renal    Social History:  reports that she has been smoking Cigarettes.  She has never used smokeless tobacco. She reports that she drinks alcohol. She reports that she does not use drugs.  Allergies:  Allergies  Allergen Reactions  . Clindamycin/Lincomycin Rash  . Latex Rash    Latex gloves     Review of Systems  Constitutional: Positive for reduced appetite. Negative for weight loss.  Eyes: Negative for visual disturbance.  Respiratory: Positive for shortness of breath.   Cardiovascular: Positive for palpitations. Negative for leg swelling.  Gastrointestinal: Negative for constipation and diarrhea.  Endocrine: Positive for fatigue. Negative for cold intolerance and heat intolerance.  Genitourinary: Negative for frequency.  Musculoskeletal: Positive  for joint pain.  Skin: Negative for rash.  Neurological: Negative for weakness and tremors.  Psychiatric/Behavioral: Positive for depressed mood.       Examination:   BP 124/78   Pulse 77   Ht 5' 1.5" (1.562 m)   Wt 133 lb 3.2 oz (60.4 kg)   SpO2 97%   BMI 24.76 kg/m    General Appearance:  well-built and nourished, pleasant, not anxious or hyperkinetic.        Eyes: No excessive prominence, lid lag or stare present. No swelling of the eyelids or ecchymosis present  Neck: The thyroid is Just palpable on swallowing on the right side, firm, smooth Not enlarged on the left There is no lymphadenopathy in the neck .           Heart: Heart rate irregular and about 96 Has normal S1 and S2, no murmurs .          Lungs: breath sounds are clear bilaterally Abdomen: no hepatosplenomegaly or other palpable abnormality  Extremities: hands are mildly warm.  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  Neurological: She does not have any tremor Deep tendon reflexes at biceps are slightly brisk.  Skin: No rash, abnormal thickening of the skin on the lower legs seen     Assessment/Plan:   Hyperthyroidism, Likely to be from Graves' disease   She does have continued mild tachycardia despite taking large doses of metoprolol, likely from hyperthyroidism Has mild enlargement of the right thyroid lobe  However she has not had any labs done recently and will need to updated labs to confirm that she still has hyperthyroidism   Discussed with the patient the hyperthyroidism is a result of an autoimmune condition involving the thyroid.  Explained that the options for treatment are basically antithyroid drugs and radioactive iodine.  Her hyperthyroidism is most likely mild and she should respond to antithyroid drugs Discussed management of hyperthyroidism and how the medication works, possible side effects Have reassured her that it does not slow down her  heart too much  Patient handout on hyperthyroidism and radioactive  iodine treatment given Patient understands the above discussion and treatment options. All questions were answered satisfactorily   [facility] 12/24/2016, 11:33 AM    Note: This office note was prepared with Dragon voice recognition system technology. Any transcriptional errors that result from this process are unintentional.

## 2016-12-24 ENCOUNTER — Ambulatory Visit (INDEPENDENT_AMBULATORY_CARE_PROVIDER_SITE_OTHER): Payer: Medicare Other | Admitting: Endocrinology

## 2016-12-24 ENCOUNTER — Encounter: Payer: Self-pay | Admitting: Endocrinology

## 2016-12-24 VITALS — BP 124/78 | HR 77 | Ht 61.5 in | Wt 133.2 lb

## 2016-12-24 DIAGNOSIS — I4819 Other persistent atrial fibrillation: Secondary | ICD-10-CM

## 2016-12-24 DIAGNOSIS — E0590 Thyrotoxicosis, unspecified without thyrotoxic crisis or storm: Secondary | ICD-10-CM | POA: Diagnosis not present

## 2016-12-24 DIAGNOSIS — I481 Persistent atrial fibrillation: Secondary | ICD-10-CM | POA: Diagnosis not present

## 2016-12-24 LAB — T3, FREE: T3, Free: 4 pg/mL (ref 2.3–4.2)

## 2016-12-24 LAB — T4, FREE: Free T4: 0.72 ng/dL (ref 0.60–1.60)

## 2016-12-24 LAB — TSH: TSH: 0.03 u[IU]/mL — ABNORMAL LOW (ref 0.35–4.50)

## 2016-12-25 LAB — THYROTROPIN RECEPTOR AUTOABS: Thyrotropin Receptor Ab: <0.5 IU/L (ref 0.00–1.75)

## 2017-01-02 ENCOUNTER — Telehealth: Payer: Self-pay | Admitting: Endocrinology

## 2017-01-02 MED ORDER — PROPYLTHIOURACIL 50 MG PO TABS: 50.0000 mg | ORAL_TABLET | Freq: Two times a day (BID) | ORAL | 2 refills | Status: DC

## 2017-01-02 NOTE — Telephone Encounter (Signed)
Patient states provider was supposed to prescribe medication at previous visit. Please advise and call patient when available.

## 2017-01-02 NOTE — Telephone Encounter (Signed)
Patient notified of MD's response via voicemail. Rx for PTU submitted.

## 2017-01-02 NOTE — Telephone Encounter (Signed)
Please confirm what this medication should be.

## 2017-01-02 NOTE — Telephone Encounter (Signed)
Please tell her that the thyroid is only borderline high, recommend that she take PTU 50 mg twice a day.  If she needs a prescription we can call this in

## 2017-01-16 ENCOUNTER — Other Ambulatory Visit: Payer: Medicare Other

## 2017-01-19 ENCOUNTER — Other Ambulatory Visit: Payer: Medicare Other

## 2017-01-22 ENCOUNTER — Other Ambulatory Visit (INDEPENDENT_AMBULATORY_CARE_PROVIDER_SITE_OTHER): Payer: Medicare Other

## 2017-01-22 DIAGNOSIS — E0590 Thyrotoxicosis, unspecified without thyrotoxic crisis or storm: Secondary | ICD-10-CM

## 2017-01-22 LAB — T4, FREE: Free T4: 0.63 ng/dL (ref 0.60–1.60)

## 2017-01-22 LAB — T3, FREE: T3, Free: 3.5 pg/mL (ref 2.3–4.2)

## 2017-02-04 ENCOUNTER — Ambulatory Visit: Payer: Medicare Other | Admitting: Endocrinology

## 2017-02-06 ENCOUNTER — Ambulatory Visit (INDEPENDENT_AMBULATORY_CARE_PROVIDER_SITE_OTHER): Payer: Medicare Other | Admitting: Endocrinology

## 2017-02-06 ENCOUNTER — Encounter: Payer: Self-pay | Admitting: Endocrinology

## 2017-02-06 VITALS — BP 130/80 | HR 77 | Ht 61.5 in | Wt 136.4 lb

## 2017-02-06 DIAGNOSIS — E0590 Thyrotoxicosis, unspecified without thyrotoxic crisis or storm: Secondary | ICD-10-CM | POA: Diagnosis not present

## 2017-02-06 NOTE — Progress Notes (Signed)
Patient ID: Rhonda Marshall, female   DOB: 12/07/1950, 66 y.o.   MRN: [id number]                                                                                                                Reason for Appointment:  Hyperthyroidism, follow-up    Chief complaint: Check thyroid   History of Present Illness:   Review of her records for thyroid History indicates that she reportedly had subclinical hyperthyroidism in 2012 when she was admitted for new onset atrial fibrillation Subsequently did not have any treatment or further evaluation She has been treated for atrial fibrillation by her cardiologist and was recommended evaluation for the thyroid recently  Recent history: The patient is a poor historian and on her initial visit she did not give any symptoms of palpitations, shakiness, feeling excessively warm and sweaty or weight loss.   She did complain of nervousness, and fatigue.  However she has also had issues with anxiety and depression   Since her baseline TSH was very low along with upper normal free T3 she has been started on PTU 50 mg twice a day She has been taking this regularly without side effects She does feel less tired now  She does have an improvement in her free T3 level and free T4 is low normal  Baseline thyrotropin receptor antibody is negative  Wt Readings from Last 3 Encounters:  02/06/17 136 lb 6.4 oz (61.9 kg)  12/24/16 133 lb 3.2 oz (60.4 kg)  12/01/16 132 lb (59.9 kg)      Thyroid function tests as follows:     Lab Results  Component Value Date   FREET4 0.63 01/22/2017   FREET4 0.72 12/24/2016   T3FREE 3.5 01/22/2017   T3FREE 4.0 12/24/2016   TSH 0.03 (L) 12/24/2016   TSH 0.359 05/23/2014   TSH 0.087 (L) 11/06/2010    Lab Results  Component Value Date   THYROTRECAB <0.50 12/24/2016     Allergies as of 02/06/2017      Reactions   Clindamycin/lincomycin Rash   Latex Rash   Latex gloves      Medication List       Accurate as of  02/06/17  8:55 AM. Always use your most recent med list.          ALPRAZolam 1 MG tablet Commonly known as:  XANAX Take 1 mg by mouth 3 (three) times daily as needed for anxiety.   apixaban 5 MG Tabs tablet Commonly known as:  ELIQUIS Take 1 tablet (5 mg total) by mouth 2 (two) times daily.   aspirin 81 MG tablet Take 81 mg by mouth daily.   celecoxib 50 MG capsule Commonly known as:  CELEBREX Take 50 mg by mouth as needed for pain.   metoprolol tartrate 100 MG tablet Commonly known as:  LOPRESSOR Take 150 mg in the morning and 100 mg at night   oxyCODONE-acetaminophen 5-325 MG tablet Commonly known as:  PERCOCET/ROXICET Take 1 tablet by mouth every 4 (four) hours as  needed for severe pain.   propylthiouracil 50 MG tablet Commonly known as:  PTU Take 1 tablet (50 mg total) by mouth 2 (two) times daily.           Past Medical History:  Diagnosis Date  . Anxiety   . Atrial fibrillation ([place])    newly diagnosed 10/2010  . Family history of adverse reaction to anesthesia    Sister's BP drops   . Hypertension   . Rheumatoid arthritis(714.0)   . Subclinical hyperthyroidism     Past Surgical History:  Procedure Laterality Date  . Arm surgery     fracture, right arm  . CARDIOVERSION N/A 08/10/2014   Procedure: CARDIOVERSION;  Surgeon: Dorothy Spark, MD;  Location: [place];  Service: Cardiovascular;  Laterality: N/A;  . CATARACT EXTRACTION    . TUBAL LIGATION      Family History  Problem Relation Age of Onset  . Heart attack Mother 20       pacemaker age 31  . Hypothyroidism Mother   . Cancer Father        colon and prostate  . Diabetes Brother   . CAD Brother 19  . Cancer Brother        Renal  . Hypothyroidism Sister     Social History:  reports that she has been smoking Cigarettes.  She has never used smokeless tobacco. She reports that she drinks alcohol. She reports that she does not use drugs.  Allergies:  Allergies  Allergen Reactions  .  Clindamycin/Lincomycin Rash  . Latex Rash    Latex gloves     Review of Systems     Examination:   BP 130/80   Pulse 77   Ht 5' 1.5" (1.562 m)   Wt 136 lb 6.4 oz (61.9 kg)   SpO2 97%   BMI 25.36 kg/m    Neck: The thyroid is enlarged about 1-1/2 times on the right side, firm and smooth Not enlarged on the left         Neurological: She does not have any tremor Deep tendon reflexes at biceps are normal    Assessment/Plan:   Hyperthyroidism, mild and not from Graves' disease Has only a small thyroid enlargement of the right side  Considering that her thyrotropin receptor antibody is negative she will need to have a thyroid scan to evaluate for a hot nodule  With taking PTU 50 mg twice a day she has improvement in her free T3 level and subjectively doing better  Discussed the plan of evaluation and possible treatment with I-131 if she does have hot nodule or toxic nodular goiter She will be scheduled for the scan and advised her not to take her PTU 5 days before the test and we will let her know about instructions subsequently   [facility] 02/06/2017, 8:55 AM    Note: This office note was prepared with Dragon voice recognition system technology. Any transcriptional errors that result from this process are unintentional.

## 2017-02-06 NOTE — Patient Instructions (Signed)
Hold PTU pill 5 days prior to test

## 2017-02-19 ENCOUNTER — Ambulatory Visit: Payer: Medicare Other | Admitting: Cardiology

## 2017-02-26 ENCOUNTER — Encounter (HOSPITAL_COMMUNITY)
Admission: RE | Admit: 2017-02-26 | Discharge: 2017-02-26 | Disposition: A | Discharge status: Home / Self Care | Payer: Medicare Other | Source: Ambulatory Visit | Attending: Endocrinology | Admitting: Endocrinology

## 2017-02-26 DIAGNOSIS — E0590 Thyrotoxicosis, unspecified without thyrotoxic crisis or storm: Secondary | ICD-10-CM

## 2017-02-26 MED ORDER — SODIUM IODIDE I 131 CAPSULE
8.0000 | Freq: Once | INTRAVENOUS | Status: AC | PRN
  Administered 2017-02-26: 8 via ORAL

## 2017-02-27 ENCOUNTER — Encounter (HOSPITAL_COMMUNITY)
Admission: RE | Admit: 2017-02-27 | Discharge: 2017-02-27 | Disposition: A | Discharge status: Home / Self Care | Payer: Medicare Other | Source: Ambulatory Visit | Attending: Endocrinology | Admitting: Endocrinology

## 2017-02-27 DIAGNOSIS — E0590 Thyrotoxicosis, unspecified without thyrotoxic crisis or storm: Secondary | ICD-10-CM | POA: Diagnosis present

## 2017-02-27 MED ORDER — SODIUM PERTECHNETATE TC 99M INJECTION
10.3300 | Freq: Once | INTRAVENOUS | Status: AC | PRN
  Administered 2017-02-27: 10.33 via INTRAVENOUS

## 2017-03-02 ENCOUNTER — Other Ambulatory Visit: Payer: Self-pay | Admitting: Endocrinology

## 2017-03-03 ENCOUNTER — Other Ambulatory Visit: Payer: Self-pay | Admitting: Endocrinology

## 2017-03-03 DIAGNOSIS — E0520 Thyrotoxicosis with toxic multinodular goiter without thyrotoxic crisis or storm: Secondary | ICD-10-CM

## 2017-03-05 ENCOUNTER — Telehealth: Payer: Self-pay | Admitting: Endocrinology

## 2017-03-05 NOTE — Telephone Encounter (Signed)
Patient returning phone call to discuss labs. Please call patient and advise.

## 2017-03-11 ENCOUNTER — Other Ambulatory Visit: Payer: Medicare Other

## 2017-03-12 NOTE — Progress Notes (Signed)
HPI The patient presents for evaluation of atrial fibrillation. She had DCCV in the past but she went back into atrial fib.  She actually was not noticing her fibrillation.  She saw Dr. Dwyane Dee to address her hyperthyroidism.  After an initial delay because she did not want to take it, she started PTU.   She is on Eliquis.  She has held her PTU recently because she had a thyroid scan.  She was not sure whether she should restart.  She has had some mild DOE and dizziness but this seems to be at baseline.  The patient denies any new symptoms such as chest discomfort, neck or arm discomfort. There has been no new PND or orthopnea. There have been no reported palpitations, presyncope or syncope.   Current Outpatient Prescriptions  Medication Sig Dispense Refill  . ALPRAZolam (XANAX) 1 MG tablet Take 1 mg by mouth 3 (three) times daily as needed for anxiety.   0  . apixaban (ELIQUIS) 5 MG TABS tablet Take 1 tablet (5 mg total) by mouth 2 (two) times daily. 60 tablet 11  . aspirin 81 MG tablet Take 81 mg by mouth daily.    . celecoxib (CELEBREX) 50 MG capsule Take 50 mg by mouth as needed for pain.    Marland Kitchen oxyCODONE-acetaminophen (PERCOCET/ROXICET) 5-325 MG per tablet Take 1 tablet by mouth every 4 (four) hours as needed for severe pain.     Marland Kitchen propylthiouracil (PTU) 50 MG tablet TAKE ONE TABLET TWICE DAILY 60 tablet 2  . metoprolol tartrate (LOPRESSOR) 100 MG tablet Take 1 tablet (100 mg total) by mouth 2 (two) times daily. 180 tablet 3  . metoprolol tartrate (LOPRESSOR) 50 MG tablet Take 1 tablet (50 mg total) by mouth 2 (two) times daily. 180 tablet 3   No current facility-administered medications for this visit.     Past Medical History:  Diagnosis Date  . Anxiety   . Atrial fibrillation ([place])    newly diagnosed 10/2010  . Family history of adverse reaction to anesthesia    Sister's BP drops   . Hypertension   . Rheumatoid arthritis(714.0)   . Subclinical hyperthyroidism     Past Surgical  History:  Procedure Laterality Date  . Arm surgery     fracture, right arm  . CARDIOVERSION N/A 08/10/2014   Procedure: CARDIOVERSION;  Surgeon: Dorothy Spark, MD;  Location: [employer];  Service: Cardiovascular;  Laterality: N/A;  . CATARACT EXTRACTION    . TUBAL LIGATION      ROS:  As stated in the HPI and negative for all other systems.  PHYSICAL EXAM BP 122/60   Pulse 95   Ht 5' 1.5" (1.562 m)   Wt 138 lb (62.6 kg)   BMI 25.65 kg/m   GENERAL:  Well appearing NECK:  No jugular venous distention, waveform within normal limits, carotid upstroke brisk and symmetric, no bruits, no thyromegaly LUNGS:  Clear to auscultation bilaterally CHEST:  Unremarkable HEART:  PMI not displaced or sustained,S1 and S2 within normal limits, no S3, no clicks, no rubs, no murmurs, irregular ABD:  Flat, positive bowel sounds normal in frequency in pitch, no bruits, no rebound, no guarding, no midline pulsatile mass, no hepatomegaly, no splenomegaly EXT:  2 plus pulses throughout, no edema, no cyanosis no clubbing, right hand arthritis greater than left.  SKIN:  Rash   EKG:  Atrial fibrillation, rate 85 interventricular conduction delay, , RAD , intervals within normal limits, poor anterior R wave progression.  ASSESSMENT AND PLAN  ATRIAL FIB:     Ms. Emmanuel Ercole has a CHA2DS2 - VASc score of 2.  She is tolerating Eliquis.  The rate is better controlled.  I will increase the metoprolol to 150 mg bid.    TOBACCO ABUSE:   She understands the need to quit smoking.   HYPERTHYROID:   She had an abnormal thyroid scan and she is going to see Dr. Dwyane Dee to discuss next steps.  I sent a message to him as she wanted to know if she should start the PTU prior to the appt.   CARDIOMYOPATHY:    I am going to check a BNP level as she has DOE.  She has had a mildly reduced EF.  I will repeat an echo in about one month after I am sure she has even better HR control.

## 2017-03-13 ENCOUNTER — Telehealth: Payer: Self-pay | Admitting: Endocrinology

## 2017-03-13 ENCOUNTER — Encounter: Payer: Self-pay | Admitting: Cardiology

## 2017-03-13 ENCOUNTER — Ambulatory Visit (INDEPENDENT_AMBULATORY_CARE_PROVIDER_SITE_OTHER): Payer: Medicare Other | Admitting: Cardiology

## 2017-03-13 VITALS — BP 122/60 | HR 95 | Ht 61.5 in | Wt 138.0 lb

## 2017-03-13 DIAGNOSIS — I481 Persistent atrial fibrillation: Secondary | ICD-10-CM

## 2017-03-13 DIAGNOSIS — I428 Other cardiomyopathies: Secondary | ICD-10-CM | POA: Diagnosis not present

## 2017-03-13 DIAGNOSIS — R0602 Shortness of breath: Secondary | ICD-10-CM

## 2017-03-13 DIAGNOSIS — I4819 Other persistent atrial fibrillation: Secondary | ICD-10-CM

## 2017-03-13 MED ORDER — METOPROLOL TARTRATE 50 MG PO TABS: 50.0000 mg | ORAL_TABLET | Freq: Two times a day (BID) | ORAL | 3 refills | Status: DC

## 2017-03-13 MED ORDER — METOPROLOL TARTRATE 100 MG PO TABS: 100.0000 mg | ORAL_TABLET | Freq: Two times a day (BID) | ORAL | 3 refills | Status: DC

## 2017-03-13 NOTE — Telephone Encounter (Signed)
LM for pt to let her know of the cancellations for Tuesday

## 2017-03-13 NOTE — Patient Instructions (Signed)
Medication Instructions:  INCREASE- Metoprolol 150 mg twice a day  If you need a refill on your cardiac medications before your next appointment, please call your pharmacy.  Labwork: BNP Today HERE IN OUR OFFICE AT [facility]  Testing/Procedures: Your physician has requested that you have an echocardiogram. Echocardiography is a painless test that uses sound waves to create images of your heart. It provides your doctor with information about the size and shape of your heart and how well your heart's chambers and valves are working. This procedure takes approximately one hour. There are no restrictions for this procedure.   Follow-Up: Your physician wants you to follow-up in: 3 Months.     Thank you for choosing [facility] at [facility]!!

## 2017-03-13 NOTE — Telephone Encounter (Signed)
-----   Message from Elayne Snare, MD sent at 03/13/2017  2:28 PM EDT ----- She is scheduled for radioactive iodine treatment on 9/6.  Please cancel her visit for 9/4 and she can see me back at the end of September with labs  Please let her know that she does not need to resu ----- Message ----- From: Minus Breeding, MD Sent: 03/13/2017   1:50 PM To: Elayne Snare, MD  Hi Vicenta Aly, They are not sure whether she should be back on the PTU.  Can you guys give her a call.  Thanks.  Maylon Cos

## 2017-03-14 ENCOUNTER — Encounter: Payer: Self-pay | Admitting: Cardiology

## 2017-03-14 LAB — BRAIN NATRIURETIC PEPTIDE: BNP: 267.7 pg/mL — ABNORMAL HIGH (ref 0.0–100.0)

## 2017-03-17 ENCOUNTER — Ambulatory Visit: Payer: Medicare Other | Admitting: Endocrinology

## 2017-03-17 ENCOUNTER — Telehealth: Payer: Self-pay | Admitting: Endocrinology

## 2017-03-17 NOTE — Telephone Encounter (Signed)
-----   Message from Elayne Snare, MD sent at 03/16/2017  4:02 PM EDT ----- Regarding: appt She needs f/u appt with labs at end of month, thanks

## 2017-03-17 NOTE — Telephone Encounter (Signed)
Scheduled

## 2017-03-18 ENCOUNTER — Telehealth: Payer: Self-pay | Admitting: Endocrinology

## 2017-03-18 NOTE — Telephone Encounter (Signed)
She does not need to be in isolation for this treatment.  She has to avoid close contact and use a separate bathroom.  She needs to talk to the radiology Department again to schedule this

## 2017-03-18 NOTE — Telephone Encounter (Signed)
Please advise 

## 2017-03-18 NOTE — Telephone Encounter (Signed)
Patients sister called and wanted to let Dr. Dwyane Dee know that Ciela was not going to be able to have the thyoid test tomorrow. She states that Dezirea does not have a place to stay away from people for 3 days. She states that when she called [facility] to cancel appointment that she asked about being able to have this test during a hospital stay. She was advised that they do not handle tests this way but let her know that [facility] does schedule them this way. Please call sister Enid Derry with information.

## 2017-03-19 ENCOUNTER — Encounter (HOSPITAL_COMMUNITY): Payer: Medicare Other

## 2017-03-19 NOTE — Telephone Encounter (Signed)
Called patients sister Rhonda Marshall and she stated that Rhonda Marshall is living with her son and 3 grand children and 5 dogs and she will not be able to be around them and they only have one bathroom. Rhonda Marshall also called the radiology department and they told her that Rhonda Marshall needed a separate bathroom and to stay away from the children and the dogs due to high dosage of radiation. Could she go to [facility]? They are able to do the test there and they will be able to keep her over night. Please advise. (I can call her back next week she stated)

## 2017-03-20 ENCOUNTER — Other Ambulatory Visit: Payer: Medicare Other

## 2017-03-20 ENCOUNTER — Telehealth: Payer: Self-pay | Admitting: Cardiology

## 2017-03-20 NOTE — Telephone Encounter (Signed)
New message    Pt pharmacy is calling for clarification.  Pt c/o medication issue:  1. Name of Medication: metoprolol   2. How are you currently taking this medication (dosage and times per day)? Need clarification  3. Are you having a reaction (difficulty breathing--STAT)? No   4. What is your medication issue? Pharmacy is calling to find out if pt is taking 100 mg or 50 mg. He states they got 2 prescritptions on 8/31. Please call.

## 2017-03-20 NOTE — Telephone Encounter (Signed)
Made aware patient is taking 150mg  BID Metoprolol.   Verbalized understanding.

## 2017-03-22 NOTE — Telephone Encounter (Signed)
She will have to see an endocrinologist again for reconsultation before they will treat her.  She can contact Dr. Elyse Hsu works for [employer] and is based in [place] if she wants  However she will need to be seen back here in follow-up after the treatment.

## 2017-03-24 ENCOUNTER — Ambulatory Visit: Payer: Medicare Other | Admitting: Endocrinology

## 2017-03-25 ENCOUNTER — Other Ambulatory Visit: Payer: Self-pay

## 2017-03-25 ENCOUNTER — Ambulatory Visit (HOSPITAL_COMMUNITY): Payer: Medicare Other | Attending: Cardiovascular Disease

## 2017-03-25 DIAGNOSIS — I4891 Unspecified atrial fibrillation: Secondary | ICD-10-CM | POA: Insufficient documentation

## 2017-03-25 DIAGNOSIS — I428 Other cardiomyopathies: Secondary | ICD-10-CM | POA: Diagnosis not present

## 2017-03-25 DIAGNOSIS — I517 Cardiomegaly: Secondary | ICD-10-CM | POA: Diagnosis not present

## 2017-03-25 DIAGNOSIS — I340 Nonrheumatic mitral (valve) insufficiency: Secondary | ICD-10-CM | POA: Diagnosis not present

## 2017-03-25 DIAGNOSIS — I429 Cardiomyopathy, unspecified: Secondary | ICD-10-CM | POA: Diagnosis present

## 2017-04-03 ENCOUNTER — Other Ambulatory Visit: Payer: Medicare Other

## 2017-04-06 ENCOUNTER — Other Ambulatory Visit (INDEPENDENT_AMBULATORY_CARE_PROVIDER_SITE_OTHER): Payer: Medicare Other

## 2017-04-06 DIAGNOSIS — E0590 Thyrotoxicosis, unspecified without thyrotoxic crisis or storm: Secondary | ICD-10-CM | POA: Diagnosis not present

## 2017-04-06 LAB — TSH: TSH: 0.19 u[IU]/mL — ABNORMAL LOW (ref 0.35–4.50)

## 2017-04-06 LAB — T3, FREE: T3 FREE: 4.3 pg/mL — AB (ref 2.3–4.2)

## 2017-04-06 LAB — T4, FREE: FREE T4: 0.8 ng/dL (ref 0.60–1.60)

## 2017-04-08 ENCOUNTER — Ambulatory Visit (INDEPENDENT_AMBULATORY_CARE_PROVIDER_SITE_OTHER): Payer: Medicare Other | Admitting: Endocrinology

## 2017-04-08 ENCOUNTER — Encounter: Payer: Self-pay | Admitting: Endocrinology

## 2017-04-08 VITALS — BP 120/82 | HR 83 | Ht 61.5 in | Wt 133.4 lb

## 2017-04-08 DIAGNOSIS — E0590 Thyrotoxicosis, unspecified without thyrotoxic crisis or storm: Secondary | ICD-10-CM | POA: Diagnosis not present

## 2017-04-08 NOTE — Patient Instructions (Signed)
PTU, 3x daily

## 2017-04-08 NOTE — Progress Notes (Signed)
Patient ID: Rhonda Marshall, female   DOB: June 24, 1951, 66 y.o.   MRN: [id number]                                                                                                                Reason for Appointment:  Hyperthyroidism, follow-up    Chief complaint: Check thyroid   History of Present Illness:   Review of her records for thyroid History indicates that she reportedly had subclinical hyperthyroidism in 2012 when she was admitted for new onset atrial fibrillation Subsequently did not have any treatment or further evaluation She has been treated for atrial fibrillation by her cardiologist and was recommended evaluation for the thyroid recently  Recent history: The patient is a poor historian and on her initial visit she did not give any symptoms of palpitations, shakiness, feeling excessively warm and sweaty or weight loss.   She did complain of nervousness and fatigue.  However she has also had issues with anxiety and depression   Since her baseline TSH was very low along with upper normal free T3 she has been started on PTU 50 mg twice a day Subsequently she had less fatigue symptoms Baseline thyrotropin receptor antibody is negative  She was scheduled for I-131 treatment because of the finding of a large hot nodule on the left side .   However she could not do this since she was not able to be in isolation for a couple of days at home She is still off her PTU since early August in preparation for I-131 and has not started back on it She was recommended hospitalization for I-131 treatment at [facility] but she has not arranged this  Currently she does not feel any palpitations, appears to have had some weight loss.  Still has some fatigue and shortness of breath on exertion  Wt Readings from Last 3 Encounters:  04/08/17 133 lb 6.4 oz (60.5 kg)  03/13/17 138 lb (62.6 kg)  02/06/17 136 lb 6.4 oz (61.9 kg)      Thyroid function tests as follows:     Lab Results    Component Value Date   FREET4 0.80 04/06/2017   FREET4 0.63 01/22/2017   FREET4 0.72 12/24/2016   T3FREE 4.3 (H) 04/06/2017   T3FREE 3.5 01/22/2017   T3FREE 4.0 12/24/2016   TSH 0.19 (L) 04/06/2017   TSH 0.03 (L) 12/24/2016   TSH 0.359 05/23/2014    Lab Results  Component Value Date   THYROTRECAB <0.50 12/24/2016     Allergies as of 04/08/2017      Reactions   Clindamycin/lincomycin Rash   Latex Rash   Latex gloves      Medication List       Accurate as of 04/08/17  9:58 AM. Always use your most recent med list.          ALPRAZolam 1 MG tablet Commonly known as:  XANAX Take 1 mg by mouth 3 (three) times daily as needed for anxiety.   apixaban 5 MG Tabs tablet Commonly known  asArne Cleveland Take 1 tablet (5 mg total) by mouth 2 (two) times daily.   aspirin 81 MG tablet Take 81 mg by mouth daily.   celecoxib 50 MG capsule Commonly known as:  CELEBREX Take 50 mg by mouth as needed for pain.   metoprolol tartrate 50 MG tablet Commonly known as:  LOPRESSOR Take 1 tablet (50 mg total) by mouth 2 (two) times daily.   oxyCODONE-acetaminophen 5-325 MG tablet Commonly known as:  PERCOCET/ROXICET Take 1 tablet by mouth every 4 (four) hours as needed for severe pain.   propylthiouracil 50 MG tablet Commonly known as:  PTU TAKE ONE TABLET TWICE DAILY           Past Medical History:  Diagnosis Date  . Anxiety   . Atrial fibrillation ([place])    newly diagnosed 10/2010  . Family history of adverse reaction to anesthesia    Sister's BP drops   . Hypertension   . Rheumatoid arthritis(714.0)   . Subclinical hyperthyroidism     Past Surgical History:  Procedure Laterality Date  . Arm surgery     fracture, right arm  . CARDIOVERSION N/A 08/10/2014   Procedure: CARDIOVERSION;  Surgeon: Dorothy Spark, MD;  Location: [facility];  Service: Cardiovascular;  Laterality: N/A;  . CATARACT EXTRACTION    . TUBAL LIGATION      Family History  Problem Relation Age  of Onset  . Heart attack Mother 46       pacemaker age 57  . Hypothyroidism Mother   . Cancer Father        colon and prostate  . Diabetes Brother   . CAD Brother 74  . Cancer Brother        Renal  . Hypothyroidism Sister     Social History:  reports that she has been smoking Cigarettes.  She has never used smokeless tobacco. She reports that she drinks alcohol. She reports that she does not use drugs.  Allergies:  Allergies  Allergen Reactions  . Clindamycin/Lincomycin Rash  . Latex Rash    Latex gloves     Review of Systems  She has chronic atrial fibrillation He shortness of breath on exertion   Examination:   BP 120/82   Pulse 83   Ht 5' 1.5" (1.562 m)   Wt 133 lb 6.4 oz (60.5 kg)   SpO2 96%   BMI 24.80 kg/m   Heart rate about 100, irregular On swallowing there is a palpable enlargement of each lobe of the thyroid medially, smooth No tremor Biceps reflexes appear normal   Assessment/Plan:   Hyperthyroidism, mild and Secondary to a hot nodule on the left side  Although ideally she should be treated with radioactive iodine this is not feasible currently because of patient's home situation and she cannot be in isolation after the treatment Given her the option of treating her long-term with PTU as long as she does not have any intolerance or side effects  Her atrial fibrillation is not controlled and she is losing weight; free T3 level is high again without the medication  She will start with PTU 50 mg 3 times a day and follow up in 1 month.  Repeat labs Continue metoprolol      Chanan Detwiler 04/08/2017, 9:58 AM    Note: This office note was prepared with Estate agent. Any transcriptional errors that result from this process are unintentional.

## 2017-04-08 NOTE — Telephone Encounter (Signed)
Patient is coming in today for an appointment.

## 2017-04-10 ENCOUNTER — Encounter: Payer: Self-pay | Admitting: Cardiology

## 2017-04-21 ENCOUNTER — Encounter: Payer: Self-pay | Admitting: Cardiology

## 2017-04-21 ENCOUNTER — Ambulatory Visit (INDEPENDENT_AMBULATORY_CARE_PROVIDER_SITE_OTHER): Payer: Medicare Other | Admitting: Cardiology

## 2017-04-21 VITALS — BP 124/76 | HR 92 | Ht 61.5 in | Wt 136.0 lb

## 2017-04-21 DIAGNOSIS — I429 Cardiomyopathy, unspecified: Secondary | ICD-10-CM

## 2017-04-21 DIAGNOSIS — I4891 Unspecified atrial fibrillation: Secondary | ICD-10-CM

## 2017-04-21 MED ORDER — METOPROLOL SUCCINATE ER 200 MG PO TB24: 200.0000 mg | ORAL_TABLET | Freq: Two times a day (BID) | ORAL | 3 refills | Status: DC

## 2017-04-21 NOTE — Progress Notes (Signed)
HPI The patient presents for evaluation of atrial fibrillation. She had DCCV in the past but she went back into atrial fib.  We have managed her with rate control and anticoagulation.  Since I last saw her she is done relatively well. The patient denies any new symptoms such as chest discomfort, neck or arm discomfort. There has been no new shortness of breath, PND or orthopnea. There have been no reported palpitations, presyncope or syncope.  I did increase her beta blocker at the last visit. She's not been able to find time to go to the hospital for thyroid ablation. She remains on PTU. She denies any chest pain, neck or arm discomfort. Only if she exerts herself what she feel an increased heart rate and she thinks it's slightly better than before.     Current Outpatient Prescriptions  Medication Sig Dispense Refill  . ALPRAZolam (XANAX) 1 MG tablet Take 1 mg by mouth 3 (three) times daily as needed for anxiety.   0  . apixaban (ELIQUIS) 5 MG TABS tablet Take 1 tablet (5 mg total) by mouth 2 (two) times daily. 60 tablet 11  . aspirin 81 MG tablet Take 81 mg by mouth daily.    . celecoxib (CELEBREX) 50 MG capsule Take 50 mg by mouth as needed for pain.    . metoprolol (TOPROL-XL) 200 MG 24 hr tablet Take 1 tablet (200 mg total) by mouth 2 (two) times daily. 180 tablet 3  . oxyCODONE-acetaminophen (PERCOCET/ROXICET) 5-325 MG per tablet Take 1 tablet by mouth every 4 (four) hours as needed for severe pain.     Marland Kitchen propylthiouracil (PTU) 50 MG tablet TAKE ONE TABLET TWICE DAILY 60 tablet 2   No current facility-administered medications for this visit.   Dictated she has  Past Medical History:  Diagnosis Date  . Anxiety   . Atrial fibrillation ([place])    newly diagnosed 10/2010  . Family history of adverse reaction to anesthesia    Sister's BP drops   . Hypertension   . Rheumatoid arthritis(714.0)   . Subclinical hyperthyroidism     Past Surgical History:  Procedure Laterality Date  .  Arm surgery     fracture, right arm  . CARDIOVERSION N/A 08/10/2014   Procedure: CARDIOVERSION;  Surgeon: Dorothy Spark, MD;  Location: [facility];  Service: Cardiovascular;  Laterality: N/A;  . CATARACT EXTRACTION    . TUBAL LIGATION      ROS:  Positive for joint pain. Otherwise as stated in the HPI and negative for all other systems.  PHYSICAL EXAM BP 124/76   Pulse 92   Ht 5' 1.5" (1.562 m)   Wt 136 lb (61.7 kg)   BMI 25.28 kg/m   GENERAL:  Well appearing NECK:  No jugular venous distention, waveform within normal limits, carotid upstroke brisk and symmetric, no bruits, no thyromegaly LUNGS:  Clear to auscultation bilaterally CHEST:  Unremarkable HEART:  PMI not displaced or sustained,S1 and S2 within normal limits, no S3, no clicks, no rubs, no murmurs, irregular ABD:  Flat, positive bowel sounds normal in frequency in pitch, no bruits, no rebound, no guarding, no midline pulsatile mass, no hepatomegaly, no splenomegaly EXT:  2 plus pulses throughout, no edema, no cyanosis no clubbing    ASSESSMENT AND PLAN  ATRIAL FIB:     Rhonda Marshall has a CHA2DS2 - VASc score of 2.  She will continue with current anticoagulation. I'm sending increase her metoprolol to 200 mg twice daily. In  2 weeks she'll get a 24-hour Holter monitor.  TOBACCO ABUSE:  She still smokes a cigarettes and she knows that I want her to quit completely.  HYPERTHYROID:  She is having this followed and regulated by Dr. Dwyane Dee.   CARDIOMYOPATHY:  I believe that this is related to atrial fib with rapid rate.  However, to rule out ischemia I will plan a Lexiscan Myoview.  She has a baseline abnormal EKG and would not be able to walk on a treadmill.    EDEMA:  At the last visit her BNP was slightly elevated.  Her edema is improved.  I asked her to reduce her salt.

## 2017-04-21 NOTE — Patient Instructions (Signed)
Medication Instructions:  INCREASE- Metoprolol 200 mg twice a day  If you need a refill on your cardiac medications before your next appointment, please call your pharmacy.  Labwork: None Ordered   Testing/Procedures: Your physician has requested that you have a lexiscan myoview. For further information please visit [email or web address]. Please follow instruction sheet, as given.  Your physician has recommended that you wear a holter monitor in 2 weeks. Holter monitors are medical devices that record the heart's electrical activity. Doctors most often use these monitors to diagnose arrhythmias. Arrhythmias are problems with the speed or rhythm of the heartbeat. The monitor is a small, portable device. You can wear one while you do your normal daily activities. This is usually used to diagnose what is causing palpitations/syncope (passing out).  Follow-Up: Your physician wants you to follow-up in: 1 Month.   Thank you for choosing [facility] at [facility]!!

## 2017-04-22 ENCOUNTER — Encounter: Payer: Self-pay | Admitting: Cardiology

## 2017-04-23 ENCOUNTER — Telehealth (HOSPITAL_COMMUNITY): Payer: Self-pay

## 2017-04-23 NOTE — Telephone Encounter (Signed)
Encounter complete. 

## 2017-04-29 ENCOUNTER — Ambulatory Visit (HOSPITAL_COMMUNITY)
Admission: RE | Admit: 2017-04-29 | Discharge: 2017-04-29 | Disposition: A | Discharge status: Home / Self Care | Payer: Medicare Other | Source: Ambulatory Visit | Attending: Cardiovascular Disease | Admitting: Cardiovascular Disease

## 2017-04-29 DIAGNOSIS — R002 Palpitations: Secondary | ICD-10-CM | POA: Diagnosis not present

## 2017-04-29 DIAGNOSIS — E0590 Thyrotoxicosis, unspecified without thyrotoxic crisis or storm: Secondary | ICD-10-CM | POA: Diagnosis not present

## 2017-04-29 DIAGNOSIS — I10 Essential (primary) hypertension: Secondary | ICD-10-CM | POA: Insufficient documentation

## 2017-04-29 DIAGNOSIS — I429 Cardiomyopathy, unspecified: Secondary | ICD-10-CM

## 2017-04-29 DIAGNOSIS — R079 Chest pain, unspecified: Secondary | ICD-10-CM | POA: Diagnosis not present

## 2017-04-29 DIAGNOSIS — I447 Left bundle-branch block, unspecified: Secondary | ICD-10-CM | POA: Diagnosis not present

## 2017-04-29 DIAGNOSIS — R0609 Other forms of dyspnea: Secondary | ICD-10-CM | POA: Insufficient documentation

## 2017-04-29 DIAGNOSIS — R5383 Other fatigue: Secondary | ICD-10-CM | POA: Diagnosis not present

## 2017-04-29 DIAGNOSIS — F17200 Nicotine dependence, unspecified, uncomplicated: Secondary | ICD-10-CM | POA: Diagnosis not present

## 2017-04-29 DIAGNOSIS — Z8249 Family history of ischemic heart disease and other diseases of the circulatory system: Secondary | ICD-10-CM | POA: Insufficient documentation

## 2017-04-29 DIAGNOSIS — I4891 Unspecified atrial fibrillation: Secondary | ICD-10-CM | POA: Insufficient documentation

## 2017-04-29 DIAGNOSIS — R9431 Abnormal electrocardiogram [ECG] [EKG]: Secondary | ICD-10-CM | POA: Insufficient documentation

## 2017-04-29 LAB — MYOCARDIAL PERFUSION IMAGING
CHL CUP RESTING HR STRESS: 101 {beats}/min
CSEPPHR: 112 {beats}/min
SDS: 1
SRS: 3
SSS: 4
TID: 1.06

## 2017-04-29 MED ORDER — REGADENOSON 0.4 MG/5ML IV SOLN
0.4000 mg | Freq: Once | INTRAVENOUS | Status: AC
  Administered 2017-04-29: 0.4 mg via INTRAVENOUS

## 2017-04-29 MED ORDER — TECHNETIUM TC 99M TETROFOSMIN IV KIT
31.1000 | PACK | Freq: Once | INTRAVENOUS | Status: AC | PRN
  Administered 2017-04-29: 31.1 via INTRAVENOUS
  Filled 2017-04-29: qty 32

## 2017-04-29 MED ORDER — TECHNETIUM TC 99M TETROFOSMIN IV KIT
10.1000 | PACK | Freq: Once | INTRAVENOUS | Status: AC | PRN
Start: 2017-04-29 — End: 2017-04-29
  Administered 2017-04-29: 10.1 via INTRAVENOUS
  Filled 2017-04-29: qty 11

## 2017-05-06 ENCOUNTER — Ambulatory Visit (INDEPENDENT_AMBULATORY_CARE_PROVIDER_SITE_OTHER): Payer: Medicare Other

## 2017-05-06 DIAGNOSIS — I4891 Unspecified atrial fibrillation: Secondary | ICD-10-CM

## 2017-05-15 ENCOUNTER — Other Ambulatory Visit (INDEPENDENT_AMBULATORY_CARE_PROVIDER_SITE_OTHER): Payer: Medicare Other

## 2017-05-15 DIAGNOSIS — E0590 Thyrotoxicosis, unspecified without thyrotoxic crisis or storm: Secondary | ICD-10-CM | POA: Diagnosis not present

## 2017-05-15 LAB — T4, FREE: Free T4: 0.5 ng/dL — ABNORMAL LOW (ref 0.60–1.60)

## 2017-05-15 LAB — CBC
HEMATOCRIT: 39.1 % (ref 36.0–46.0)
HEMOGLOBIN: 13 g/dL (ref 12.0–15.0)
MCHC: 33.2 g/dL (ref 30.0–36.0)
MCV: 95 fl (ref 78.0–100.0)
PLATELETS: 255 10*3/uL (ref 150.0–400.0)
RBC: 4.12 Mil/uL (ref 3.87–5.11)
RDW: 14.1 % (ref 11.5–15.5)
WBC: 5.4 10*3/uL (ref 4.0–10.5)

## 2017-05-15 LAB — T3, FREE: T3, Free: 3.1 pg/mL (ref 2.3–4.2)

## 2017-05-15 LAB — ALT: ALT: 11 U/L (ref 0–35)

## 2017-05-15 LAB — TSH: TSH: 1.1 u[IU]/mL (ref 0.35–4.50)

## 2017-05-18 ENCOUNTER — Ambulatory Visit (INDEPENDENT_AMBULATORY_CARE_PROVIDER_SITE_OTHER): Payer: Medicare Other | Admitting: Endocrinology

## 2017-05-18 ENCOUNTER — Encounter: Payer: Self-pay | Admitting: Endocrinology

## 2017-05-18 VITALS — BP 138/84 | HR 97 | Ht 61.5 in | Wt 135.2 lb

## 2017-05-18 DIAGNOSIS — E0590 Thyrotoxicosis, unspecified without thyrotoxic crisis or storm: Secondary | ICD-10-CM

## 2017-05-18 NOTE — Patient Instructions (Signed)
Reduce PTU to twice daily

## 2017-05-18 NOTE — Progress Notes (Signed)
Patient ID: Rhonda Marshall, female   DOB: [birth year], 66 y.o.   MRN: [id number]                                                                                                                Reason for Appointment:  Hyperthyroidism, follow-up    Chief complaint: Check thyroid   History of Present Illness:   Review of her records for thyroid History indicates that she reportedly had subclinical hyperthyroidism in 2012 when she was admitted for new onset atrial fibrillation Subsequently did not have any treatment or further evaluation She has been treated for atrial fibrillation by her cardiologist and was recommended evaluation for the thyroid recently  Recent history: The patient is a poor historian and on her initial visit she did not give any symptoms of palpitations, shakiness, feeling excessively warm and sweaty or weight loss.  She did complain of nervousness and fatigue.  However she has also had issues with anxiety and depression   Since her baseline TSH was very low along with upper normal free T3 she has been started on PTU 50 mg twice a day Baseline thyrotropin receptor antibody was negative  With starting treatment she had less fatigue symptoms  She was scheduled for I-131 treatment because of the finding of a large hot nodule on the left side However she could not do this since she was not able to be in isolation for a couple of days at home She was seen in follow-up in 9/18 and was still off her PTU since early August in preparation for I-131  Although she was not having palpitations she was having some fatigue and shortness of breath and was having more problems with her tachycardia Her free T3 was up above normal at 4.3 She is now back on PTU 50 mg 3 times a day and she does feel less fatigued again Her weight is stable  Wt Readings from Last 3 Encounters:  05/18/17 135 lb 3.2 oz (61.3 kg)  04/29/17 136 lb (61.7 kg)  04/21/17 136 lb (61.7 kg)      Thyroid  function tests as follows:     Lab Results  Component Value Date   FREET4 0.50 (L) 05/15/2017   FREET4 0.80 04/06/2017   FREET4 0.63 01/22/2017   T3FREE 3.1 05/15/2017   T3FREE 4.3 (H) 04/06/2017   T3FREE 3.5 01/22/2017   TSH 1.10 05/15/2017   TSH 0.19 (L) 04/06/2017   TSH 0.03 (L) 12/24/2016    Lab Results  Component Value Date   THYROTRECAB <0.50 12/24/2016     Allergies as of 05/18/2017      Reactions   Clindamycin/lincomycin Rash   Latex Rash   Latex gloves      Medication List        Accurate as of 05/18/17  3:52 PM. Always use your most recent med list.          ALPRAZolam 1 MG tablet Commonly known as:  XANAX Take 1 mg by mouth 3 (three) times daily  as needed for anxiety.   apixaban 5 MG Tabs tablet Commonly known as:  ELIQUIS Take 1 tablet (5 mg total) by mouth 2 (two) times daily.   aspirin 81 MG tablet Take 81 mg by mouth daily.   celecoxib 50 MG capsule Commonly known as:  CELEBREX Take 50 mg by mouth as needed for pain.   metoprolol 200 MG 24 hr tablet Commonly known as:  TOPROL-XL Take 1 tablet (200 mg total) by mouth 2 (two) times daily.   oxyCODONE-acetaminophen 5-325 MG tablet Commonly known as:  PERCOCET/ROXICET Take 1 tablet by mouth every 4 (four) hours as needed for severe pain.   propylthiouracil 50 MG tablet Commonly known as:  PTU TAKE ONE TABLET TWICE DAILY           Past Medical History:  Diagnosis Date  . Anxiety   . Atrial fibrillation ([place])    newly diagnosed 10/2010  . Family history of adverse reaction to anesthesia    Sister's BP drops   . Hypertension   . Rheumatoid arthritis(714.0)   . Subclinical hyperthyroidism     Past Surgical History:  Procedure Laterality Date  . Arm surgery     fracture, right arm  . CATARACT EXTRACTION    . TUBAL LIGATION      Family History  Problem Relation Age of Onset  . Heart attack Mother 60       pacemaker age 63  . Hypothyroidism Mother   . Cancer Father         colon and prostate  . Diabetes Brother   . CAD Brother 15  . Cancer Brother        Renal  . Hypothyroidism Sister     Social History:  reports that she has been smoking cigarettes.  she has never used smokeless tobacco. She reports that she drinks alcohol. She reports that she does not use drugs.  Allergies:  Allergies  Allergen Reactions  . Clindamycin/Lincomycin Rash  . Latex Rash    Latex gloves     Review of Systems  She has chronic atrial fibrillation Followed by cardiologist regularly Currently on metoprolol 200 mg daily   Examination:   BP 138/84   Pulse 97   Ht 5' 1.5" (1.562 m)   Wt 135 lb 3.2 oz (61.3 kg)   SpO2 98%   BMI 25.13 kg/m   Heart rate  is fluctuating significantly Thyroid not palpable Reflexes are normal    Assessment/Plan:   Hyperthyroidism, mild and Secondary to a hot nodule on the left side  Her symptoms with the hyperthyroidism only mild fatigue but has tendency to more tachycardia when her thyroid is not controlled  With starting treatment about 6 weeks ago her T3 level is back to normal but her free T4 is mildly decreased TSH is now back to normal  She will now cut back on her PTU to 50 mg twice a day and hopefully this may be her maintenance dose She may need to continue this indefinitely since she has a hot nodule Follow-up in about a month  Rhonda Marshall 05/18/2017, 3:52 PM    Note: This office note was prepared with Dragon voice recognition system technology. Any transcriptional errors that result from this process are unintentional.

## 2017-05-20 ENCOUNTER — Other Ambulatory Visit: Payer: Self-pay | Admitting: Endocrinology

## 2017-06-08 NOTE — Progress Notes (Signed)
HPI The patient presents for evaluation of atrial fibrillation. She had DCCV in the past but she went back into atrial fib.  We have managed her with rate control and anticoagulation.   At the last visit I increased her beta blocker and then followed up with a Holter which demonstrated reasonable rate control.  She returns for follow up.  Following the last visit she had a Holter which demonstrated a heart rate to be about 109 on average.  With peak 255 and go down when she was asleep.  She had a perfusion study which demonstrated no ischemia.  She does have a mildly reduced ejection fraction.  Recently her primary care wanted to start her on hydrochlorothiazide because her blood pressure was elevated.  We held off on doing this.  She has had PTU treatment of her thyroid and actually says now her thyroid is a little low.  Current Outpatient Medications  Medication Sig Dispense Refill  . ALPRAZolam (XANAX) 1 MG tablet Take 1 mg by mouth 3 (three) times daily as needed for anxiety.   0  . apixaban (ELIQUIS) 5 MG TABS tablet Take 1 tablet (5 mg total) by mouth 2 (two) times daily. 60 tablet 11  . aspirin 81 MG tablet Take 81 mg by mouth daily.    . celecoxib (CELEBREX) 50 MG capsule Take 50 mg by mouth as needed for pain.    . metoprolol (TOPROL-XL) 200 MG 24 hr tablet Take 1 tablet (200 mg total) by mouth 2 (two) times daily. 180 tablet 3  . oxyCODONE-acetaminophen (PERCOCET/ROXICET) 5-325 MG per tablet Take 1 tablet by mouth every 4 (four) hours as needed for severe pain.     Marland Kitchen propylthiouracil (PTU) 50 MG tablet Take 50 mg by mouth daily.    Marland Kitchen diltiazem (CARDIZEM CD) 120 MG 24 hr capsule Take 1 capsule (120 mg total) by mouth daily. 90 capsule 3   No current facility-administered medications for this visit.   Dictated she has  Past Medical History:  Diagnosis Date  . Anxiety   . Atrial fibrillation ([place])    newly diagnosed 10/2010  . Family history of adverse reaction to anesthesia    Sister's BP drops   . Hypertension   . Rheumatoid arthritis(714.0)   . Subclinical hyperthyroidism     Past Surgical History:  Procedure Laterality Date  . Arm surgery     fracture, right arm  . CARDIOVERSION N/A 08/10/2014   Procedure: CARDIOVERSION;  Surgeon: Rhonda Spark, MD;  Location: [facility];  Service: Cardiovascular;  Laterality: N/A;  . CATARACT EXTRACTION    . TUBAL LIGATION      ROS:  As stated in the HPI and negative for all other systems.  PHYSICAL EXAM BP 122/80   Pulse (!) 116   Ht 5\' 1"  (1.549 m)   Wt 136 lb 8 oz (61.9 kg)   SpO2 93%   BMI 25.79 kg/m   GENERAL:  Well appearing NECK:  No jugular venous distention, waveform within normal limits, carotid upstroke brisk and symmetric, no bruits, no thyromegaly LUNGS:  Clear to auscultation bilaterally CHEST:  Unremarkable HEART:  PMI not displaced or sustained,S1 and S2 within normal limits, no S3, no clicks, no rubs, no murmurs, irregularABD:  Flat, positive bowel sounds normal in frequency in pitch, no bruits, no rebound, no guarding, no midline pulsatile mass, no hepatomegaly, no splenomegaly EXT:  2 plus pulses throughout, no edema, no cyanosis no clubbing    EKG:  NA  ASSESSMENT AND PLAN  ATRIAL FIB:     [name] has a CHA2DS2 - VASc score of 2.   I would like to try to lower her heart rate slightly more and will start Cardizem 120 mg daily.  She will let me know if she has any lightheadedness presyncope or syncope.  Follow-up with a Holter in the future.  TOBACCO ABUSE: She has been advised multiple times to quit smoking we talked about strategies.  HYPERTHYROID:    She is having this followed by Dr Rhonda Marshall.  Last TSH was normal.  CARDIOMYOPATHY:  I believe that this is related to atrial fib with rapid rate.  She had a negative Lexiscan Myoview I will follow up with repeat echos when she has adequate rate control. t be able to walk on a treadmill.    EDEMA:  This is improved.

## 2017-06-09 ENCOUNTER — Encounter: Payer: Self-pay | Admitting: Cardiology

## 2017-06-09 ENCOUNTER — Ambulatory Visit (INDEPENDENT_AMBULATORY_CARE_PROVIDER_SITE_OTHER): Payer: Medicare Other | Admitting: Cardiology

## 2017-06-09 VITALS — BP 122/80 | HR 116 | Ht 61.0 in | Wt 136.5 lb

## 2017-06-09 DIAGNOSIS — I428 Other cardiomyopathies: Secondary | ICD-10-CM | POA: Diagnosis not present

## 2017-06-09 DIAGNOSIS — I482 Chronic atrial fibrillation: Secondary | ICD-10-CM | POA: Diagnosis not present

## 2017-06-09 DIAGNOSIS — I4821 Permanent atrial fibrillation: Secondary | ICD-10-CM

## 2017-06-09 MED ORDER — DILTIAZEM HCL ER COATED BEADS 120 MG PO CP24: 120.0000 mg | ORAL_CAPSULE | Freq: Every day | ORAL | 3 refills | Status: DC

## 2017-06-09 NOTE — Patient Instructions (Signed)
Medication Instructions:  START- Cardizem 120 mg daily  If you need a refill on your cardiac medications before your next appointment, please call your pharmacy.  Labwork: None Ordered  Testing/Procedures: None Ordered  Follow-Up: Your physician wants you to follow-up in: 3 Months. You should receive a reminder letter in the mail two months in advance. If you do not receive a letter, please call our office [phone].    Thank you for choosing [facility] at [facility]!!

## 2017-06-11 ENCOUNTER — Other Ambulatory Visit (INDEPENDENT_AMBULATORY_CARE_PROVIDER_SITE_OTHER): Payer: Medicare Other

## 2017-06-11 DIAGNOSIS — E0590 Thyrotoxicosis, unspecified without thyrotoxic crisis or storm: Secondary | ICD-10-CM

## 2017-06-11 LAB — TSH: TSH: 0.6 u[IU]/mL (ref 0.35–4.50)

## 2017-06-11 LAB — T3, FREE: T3, Free: 3.5 pg/mL (ref 2.3–4.2)

## 2017-06-11 LAB — T4, FREE: Free T4: 0.56 ng/dL — ABNORMAL LOW (ref 0.60–1.60)

## 2017-06-15 ENCOUNTER — Ambulatory Visit: Payer: Medicare Other | Admitting: Endocrinology

## 2017-07-01 ENCOUNTER — Encounter: Payer: Self-pay | Admitting: Endocrinology

## 2017-07-01 ENCOUNTER — Ambulatory Visit (INDEPENDENT_AMBULATORY_CARE_PROVIDER_SITE_OTHER): Payer: Medicare Other | Admitting: Endocrinology

## 2017-07-01 VITALS — BP 130/78 | HR 74 | Ht 61.0 in | Wt 136.2 lb

## 2017-07-01 DIAGNOSIS — E041 Nontoxic single thyroid nodule: Secondary | ICD-10-CM | POA: Diagnosis not present

## 2017-07-01 DIAGNOSIS — E0590 Thyrotoxicosis, unspecified without thyrotoxic crisis or storm: Secondary | ICD-10-CM | POA: Diagnosis not present

## 2017-07-01 DIAGNOSIS — Z23 Encounter for immunization: Secondary | ICD-10-CM | POA: Diagnosis not present

## 2017-07-01 NOTE — Progress Notes (Signed)
Patient ID: Rhonda Marshall, female   DOB: 08/26/1950, 66 y.o.   MRN: [id number]                                                                                                                Reason for Appointment:  Hyperthyroidism, follow-up    Chief complaint: Check thyroid   History of Present Illness:   Review of her records for thyroid History indicates that she reportedly had subclinical hyperthyroidism in 2012 when she was admitted for new onset atrial fibrillation Subsequently did not have any treatment or further evaluation She has been treated for atrial fibrillation by her cardiologist and was recommended evaluation for the thyroid recently  Recent history: The patient is a poor historian and on her initial visit she did not give any symptoms of palpitations, shakiness, feeling excessively warm and sweaty or weight loss.  She did complain of nervousness and fatigue.  However she has also had issues with anxiety and depression Since her baseline TSH was very low along with upper normal free T3 she has been started on PTU 50 mg twice a day Baseline thyrotropin receptor antibody was negative With starting treatment she had less fatigue symptoms  She was scheduled for I-131 treatment because of the finding of a large hot nodule on the left side However she could not do this since she was not able to be in isolation for a couple of days at home She was seen in follow-up in 9/18 and was still off her PTU since early August in preparation for I-131  RECENT history: In 03/2017 she was not having palpitations, however she was having some fatigue and shortness of breath and was having more problems with her tachycardia Her free T3 was up above normal at 4.3 at the same time  She is now on PTU 50 mg 2 times a day Her dose was reduced in early November because of low-normal free T4 level although free T3 was quite normal Her weight is stable She says she feels better and is more  active because of control of her heart rate and dyspnea  Wt Readings from Last 3 Encounters:  07/01/17 136 lb 3.2 oz (61.8 kg)  06/09/17 136 lb 8 oz (61.9 kg)  05/18/17 135 lb 3.2 oz (61.3 kg)      Thyroid function tests as follows:     Lab Results  Component Value Date   FREET4 0.56 (L) 06/11/2017   FREET4 0.50 (L) 05/15/2017   FREET4 0.80 04/06/2017   T3FREE 3.5 06/11/2017   T3FREE 3.1 05/15/2017   T3FREE 4.3 (H) 04/06/2017   TSH 0.60 06/11/2017   TSH 1.10 05/15/2017   TSH 0.19 (L) 04/06/2017    Lab Results  Component Value Date   THYROTRECAB <0.50 12/24/2016     Allergies as of 07/01/2017      Reactions   Clindamycin/lincomycin Rash   Latex Rash   Latex gloves      Medication List        Accurate as  of 07/01/17  1:55 PM. Always use your most recent med list.          ALPRAZolam 1 MG tablet Commonly known as:  XANAX Take 1 mg by mouth 3 (three) times daily as needed for anxiety.   apixaban 5 MG Tabs tablet Commonly known as:  ELIQUIS Take 1 tablet (5 mg total) by mouth 2 (two) times daily.   aspirin 81 MG tablet Take 81 mg by mouth daily.   celecoxib 50 MG capsule Commonly known as:  CELEBREX Take 50 mg by mouth as needed for pain.   diltiazem 120 MG 24 hr capsule Commonly known as:  CARDIZEM CD Take 1 capsule (120 mg total) by mouth daily.   metoprolol 200 MG 24 hr tablet Commonly known as:  TOPROL-XL Take 1 tablet (200 mg total) by mouth 2 (two) times daily.   oxyCODONE-acetaminophen 5-325 MG tablet Commonly known as:  PERCOCET/ROXICET Take 1 tablet by mouth every 4 (four) hours as needed for severe pain.   propylthiouracil 50 MG tablet Commonly known as:  PTU Take 50 mg by mouth daily.           Past Medical History:  Diagnosis Date  . Anxiety   . Atrial fibrillation ([place])    newly diagnosed 10/2010  . Family history of adverse reaction to anesthesia    Sister's BP drops   . Hypertension   . Rheumatoid arthritis(714.0)   .  Subclinical hyperthyroidism     Past Surgical History:  Procedure Laterality Date  . Arm surgery     fracture, right arm  . CARDIOVERSION N/A 08/10/2014   Procedure: CARDIOVERSION;  Surgeon: Dorothy Spark, MD;  Location: [facility];  Service: Cardiovascular;  Laterality: N/A;  . CATARACT EXTRACTION    . TUBAL LIGATION      Family History  Problem Relation Age of Onset  . Heart attack Mother 73       pacemaker age 58  . Hypothyroidism Mother   . Cancer Father        colon and prostate  . Diabetes Brother   . CAD Brother 25  . Cancer Brother        Renal  . Hypothyroidism Sister     Social History:  reports that she has been smoking cigarettes.  she has never used smokeless tobacco. She reports that she drinks alcohol. She reports that she does not use drugs.  Allergies:  Allergies  Allergen Reactions  . Clindamycin/Lincomycin Rash  . Latex Rash    Latex gloves     Review of Systems  She has chronic atrial fibrillation Followed by cardiologist regularly Currently on metoprolol 200 mg daily   Examination:   BP 130/78   Pulse 74   Ht 5\' 1"  (1.549 m)   Wt 136 lb 3.2 oz (61.8 kg)   SpO2 97%   BMI 25.73 kg/m   Heart rate  Is 65-70 Thyroid not clearly palpable on the left but has about a 2 cm nodule felt on the right, firm and smooth Reflexes are normal No tremor    Assessment/Plan:   Hyperthyroidism, mild and Secondary to a hot nodule on the left side  Also likely has multinodular goiter  Her symptoms with the hyperthyroidism had been nonspecific, probably only mild fatigue at baseline  Also she had mostly T3 toxicosis which is now controlled with only 50 mg twice a day of PTU Subjectively feeling better especially with her heart rate been controlled  Although her free  T4 is slightly low but is not as low and her free T3 is still on the upper side of the normal range TSH normal  For now she can continue her low dose of PTU 50 mg twice a day  especially since her labs were done 3 weeks ago Will follow-up in 6 weeks again  Elayne Snare 07/01/2017, 1:55 PM    Note: This office note was prepared with Dragon voice recognition system technology. Any transcriptional errors that result from this process are unintentional.

## 2017-08-07 ENCOUNTER — Other Ambulatory Visit (INDEPENDENT_AMBULATORY_CARE_PROVIDER_SITE_OTHER): Payer: Medicare Other

## 2017-08-07 DIAGNOSIS — E041 Nontoxic single thyroid nodule: Secondary | ICD-10-CM | POA: Diagnosis not present

## 2017-08-07 LAB — COMPREHENSIVE METABOLIC PANEL
ALBUMIN: 4.2 g/dL (ref 3.5–5.2)
ALK PHOS: 123 U/L — AB (ref 39–117)
ALT: 15 U/L (ref 0–35)
AST: 19 U/L (ref 0–37)
BILIRUBIN TOTAL: 0.4 mg/dL (ref 0.2–1.2)
BUN: 15 mg/dL (ref 6–23)
CALCIUM: 9.6 mg/dL (ref 8.4–10.5)
CO2: 24 mEq/L (ref 19–32)
Chloride: 103 mEq/L (ref 96–112)
Creatinine, Ser: 1.16 mg/dL (ref 0.40–1.20)
GFR: 49.4 mL/min — ABNORMAL LOW (ref >60.00)
Glucose, Bld: 120 mg/dL — ABNORMAL HIGH (ref 70–99)
Potassium: 3.8 mEq/L (ref 3.5–5.1)
SODIUM: 137 meq/L (ref 135–145)
TOTAL PROTEIN: 7.8 g/dL (ref 6.0–8.3)

## 2017-08-07 LAB — T3, FREE: T3 FREE: 3.6 pg/mL (ref 2.3–4.2)

## 2017-08-07 LAB — T4, FREE: FREE T4: 0.62 ng/dL (ref 0.60–1.60)

## 2017-08-07 LAB — TSH: TSH: 0.22 u[IU]/mL — ABNORMAL LOW (ref 0.35–4.50)

## 2017-08-12 ENCOUNTER — Ambulatory Visit (INDEPENDENT_AMBULATORY_CARE_PROVIDER_SITE_OTHER): Payer: Medicare Other | Admitting: Endocrinology

## 2017-08-12 ENCOUNTER — Encounter: Payer: Self-pay | Admitting: Endocrinology

## 2017-08-12 ENCOUNTER — Other Ambulatory Visit: Payer: Self-pay | Admitting: Endocrinology

## 2017-08-12 VITALS — BP 117/73 | HR 80 | Ht 61.0 in | Wt 133.8 lb

## 2017-08-12 DIAGNOSIS — E041 Nontoxic single thyroid nodule: Secondary | ICD-10-CM | POA: Diagnosis not present

## 2017-08-12 NOTE — Progress Notes (Signed)
Patient ID: Rhonda Marshall, female   DOB: 1951/01/06, 67 y.o.   MRN: [id number]                                                                                                                Reason for Appointment:  Hyperthyroidism, follow-up    Chief complaint: Check thyroid   History of Present Illness:   Review of her records for thyroid History indicates that she reportedly had subclinical hyperthyroidism in 2012 when she was admitted for new onset atrial fibrillation Subsequently did not have any treatment or further evaluation She has been treated for atrial fibrillation by her cardiologist and was recommended evaluation for the thyroid recently  The patient is a poor historian and on her initial visit she did not give any symptoms of palpitations, shakiness, feeling excessively warm and sweaty or weight loss.  She did complain of nervousness and fatigue.  However she has also had issues with anxiety and depression Since her baseline TSH was very low along with upper normal free T3 she has been started on PTU 50 mg twice a day Baseline thyrotropin receptor antibody was negative With starting treatment she had less fatigue symptoms  She was scheduled for I-131 treatment because of the finding of a large hot nodule on the left side However she could not do this since she was not able to be in isolation for a couple of days at home   RECENT history: She was seen in follow-up in 9/18 and was still off her PTU since early August in preparation for I-131 In 03/2017 she was not having palpitations, however she was having some fatigue and shortness of breath and was having more problems with her tachycardia Her free T3 was up above normal at 4.3 at the same time  She has continued on PTU 50 mg 2 times a day Her dose was reduced in early November from 3 tablets a day because of low-normal free T4 level although free T3 was quite normal  She does not complain of any shortness of breath  on exertion, palpitations and is able to do a little morephysical activity   Wt Readings from Last 3 Encounters:  08/12/17 133 lb 12.8 oz (60.7 kg)  07/01/17 136 lb 3.2 oz (61.8 kg)  06/09/17 136 lb 8 oz (61.9 kg)    recent thyroid levels show her free T4 is not low now However TSH is slightly lower than before Free T3 is upper normal again  Thyroid function tests as follows:     Lab Results  Component Value Date   FREET4 0.62 08/07/2017   FREET4 0.56 (L) 06/11/2017   FREET4 0.50 (L) 05/15/2017   T3FREE 3.6 08/07/2017   T3FREE 3.5 06/11/2017   T3FREE 3.1 05/15/2017   TSH 0.22 (L) 08/07/2017   TSH 0.60 06/11/2017   TSH 1.10 05/15/2017    Lab Results  Component Value Date   THYROTRECAB <0.50 12/24/2016     Allergies as of 08/12/2017      Reactions  Clindamycin/lincomycin Rash   Latex Rash   Latex gloves      Medication List        Accurate as of 08/12/17  3:21 PM. Always use your most recent med list.          ALPRAZolam 1 MG tablet Commonly known as:  XANAX Take 1 mg by mouth 3 (three) times daily as needed for anxiety.   apixaban 5 MG Tabs tablet Commonly known as:  ELIQUIS Take 1 tablet (5 mg total) by mouth 2 (two) times daily.   aspirin 81 MG tablet Take 81 mg by mouth daily.   celecoxib 50 MG capsule Commonly known as:  CELEBREX Take 50 mg by mouth as needed for pain.   diltiazem 120 MG 24 hr capsule Commonly known as:  CARDIZEM CD Take 1 capsule (120 mg total) by mouth daily.   metoprolol 200 MG 24 hr tablet Commonly known as:  TOPROL-XL Take 1 tablet (200 mg total) by mouth 2 (two) times daily.   oxyCODONE-acetaminophen 5-325 MG tablet Commonly known as:  PERCOCET/ROXICET Take 1 tablet by mouth every 4 (four) hours as needed for severe pain.   propylthiouracil 50 MG tablet Commonly known as:  PTU Take 50 mg by mouth daily.           Past Medical History:  Diagnosis Date  . Anxiety   . Atrial fibrillation ([place])    newly  diagnosed 10/2010  . Family history of adverse reaction to anesthesia    Sister's BP drops   . Hypertension   . Rheumatoid arthritis(714.0)   . Subclinical hyperthyroidism     Past Surgical History:  Procedure Laterality Date  . Arm surgery     fracture, right arm  . CARDIOVERSION N/A 08/10/2014   Procedure: CARDIOVERSION;  Surgeon: Dorothy Spark, MD;  Location: [place];  Service: Cardiovascular;  Laterality: N/A;  . CATARACT EXTRACTION    . TUBAL LIGATION      Family History  Problem Relation Age of Onset  . Heart attack Mother 42       pacemaker age 8  . Hypothyroidism Mother   . Cancer Father        colon and prostate  . Diabetes Brother   . CAD Brother 3  . Cancer Brother        Renal  . Hypothyroidism Sister     Social History:  reports that she has been smoking cigarettes.  she has never used smokeless tobacco. She reports that she drinks alcohol. She reports that she does not use drugs.  Allergies:  Allergies  Allergen Reactions  . Clindamycin/Lincomycin Rash  . Latex Rash    Latex gloves     Review of Systems  She has chronic atrial fibrillation Followed by cardiologist regularly Currently on metoprolol 200 mg daily   Examination:   BP 117/73 (BP Location: Left Arm, Patient Position: Sitting, Cuff Size: Large)   Pulse 80   Ht 5\' 1"  (1.549 m)   Wt 133 lb 12.8 oz (60.7 kg)   BMI 25.28 kg/m   She looks well Heart rate  Is 65-70 irregular Reflexes are normal No tremor    Assessment/Plan:   Hyperthyroidism, mild and Secondary to a hot nodule on the left side  Also on exam has multinodular goiter  Her symptoms with the hyperthyroidism had been nonspecific, probably only mild fatigue, some dyspnea on exertion at baseline  She had mostly T3 toxicosis which is now controlled with only 50 mg  twice a day of PTU Subjectively feeling better consistently  Although her free T4 is low normal this has improved from before and her free T3 is  still on the upper side of the range TSH is low normal  Advised her to continue her low dose of PTU 50 mg twice a day   She will come back in 3 months for follow-up, was likely will need to be on her PTU for long-term  Elayne Snare 08/12/2017, 3:21 PM    Note: This office note was prepared with Dragon voice recognition system technology. Any transcriptional errors that result from this process are unintentional.

## 2017-09-09 ENCOUNTER — Other Ambulatory Visit: Payer: Self-pay | Admitting: Cardiology

## 2017-09-10 NOTE — Progress Notes (Signed)
HPI The patient presents for evaluation of atrial fibrillation. She had DCCV in the past but she went back into atrial fib.  We have managed her with rate control and anticoagulation.   At the last visit I started Cardizem.  She did well with this.  She has not had any lightheadedness or presyncope.  Her heart rate has been lower.  Blood pressure has been good.  Unfortunately she is not feeling well from a lung standpoint.  She had a cough productive of some yellow phlegm.  She thinks she is had some fevers and chills.  She does not have an appointment with her primary care doctor until late next week.  She is not had any respiratory distress.  She is avoiding cigarettes.   Current Outpatient Medications  Medication Sig Dispense Refill  . ALPRAZolam (XANAX) 1 MG tablet Take 1 mg by mouth 3 (three) times daily as needed for anxiety.   0  . aspirin 81 MG tablet Take 81 mg by mouth daily.    . celecoxib (CELEBREX) 50 MG capsule Take 50 mg by mouth as needed for pain.    Marland Kitchen ELIQUIS 5 MG TABS tablet Take 1 tablet (5 mg total) by mouth 2 (two) times daily. 180 tablet 1  . metoprolol (TOPROL-XL) 200 MG 24 hr tablet Take 1 tablet (200 mg total) by mouth 2 (two) times daily. 180 tablet 3  . oxyCODONE-acetaminophen (PERCOCET/ROXICET) 5-325 MG per tablet Take 1 tablet by mouth every 4 (four) hours as needed for severe pain.     Marland Kitchen propylthiouracil (PTU) 50 MG tablet TAKE ONE TABLET TWICE DAILY 60 tablet 2  . azithromycin (ZITHROMAX Z-PAK) 250 MG tablet Take 2 tablet first day, then 1 tablet daily until complete 6 each 0  . diltiazem (CARDIZEM CD) 120 MG 24 hr capsule Take 1 capsule (120 mg total) by mouth daily. 90 capsule 3   No current facility-administered medications for this visit.   Dictated she has  Past Medical History:  Diagnosis Date  . Anxiety   . Atrial fibrillation ([place])    newly diagnosed 10/2010  . Family history of adverse reaction to anesthesia    Sister's BP drops   .  Hypertension   . Rheumatoid arthritis(714.0)   . Subclinical hyperthyroidism     Past Surgical History:  Procedure Laterality Date  . Arm surgery     fracture, right arm  . CARDIOVERSION N/A 08/10/2014   Procedure: CARDIOVERSION;  Surgeon: Dorothy Spark, MD;  Location: [facility];  Service: Cardiovascular;  Laterality: N/A;  . CATARACT EXTRACTION    . TUBAL LIGATION      ROS:   As stated in the HPI and negative for all other systems.   PHYSICAL EXAM BP 118/78 (BP Location: Left Arm, Patient Position: Sitting, Cuff Size: Normal)   Pulse (!) 102   Ht 5\' 1"  (1.549 m)   Wt 133 lb 12.8 oz (60.7 kg)   BMI 25.28 kg/m   GENERAL:  Well appearing NECK:  No jugular venous distention, waveform within normal limits, carotid upstroke brisk and symmetric, no bruits, no thyromegaly LUNGS: Decreased breath sounds with wheezing and rhonchi. CHEST:  Unremarkable HEART:  PMI not displaced or sustained,S1 and S2 within normal limits, no S3, no S4, no clicks, no rubs, no murmurs ABD:  Flat, positive bowel sounds normal in frequency in pitch, no bruits, no rebound, no guarding, no midline pulsatile mass, no hepatomegaly, no splenomegaly EXT:  2 plus pulses throughout,  no edema, no cyanosis no clubbing   EKG:    NA And and then give her Z-Pak see her back probably in ASSESSMENT AND PLAN  ATRIAL FIB:     [name] has a CHA2DS2 - VASc score of 2.   Her rate seems to be much better controlled.  She will continue on the meds as listed plus anticoagulation.  TOBACCO ABUSE:   She is advised again today to stop smoking completely and she is almost there.   HYPERTHYROID:    This is followed by Dr. Dwyane Dee.   CARDIOMYOPATHY:  I believe that this is related to atrial fib with rapid rate. She will continue with meds as listed and I will follow up with echoes in the future.   EDEMA:  This is improved. No change in therapy.   COUGH: She has a lung exam as above.  I will take the liberty of  giving her some antibiotics.  However, if she gets worse over the weekend she needs to go to an urgent care she might need further management with bronchodilators and steroids.  We discussed this.  She needs to completely abstain from cigarettes.  3 activity but we will check with the pharmacist

## 2017-09-11 ENCOUNTER — Ambulatory Visit (INDEPENDENT_AMBULATORY_CARE_PROVIDER_SITE_OTHER): Payer: Medicare Other | Admitting: Cardiology

## 2017-09-11 ENCOUNTER — Encounter: Payer: Self-pay | Admitting: Cardiology

## 2017-09-11 VITALS — BP 118/78 | HR 102 | Ht 61.0 in | Wt 133.8 lb

## 2017-09-11 DIAGNOSIS — I4821 Permanent atrial fibrillation: Secondary | ICD-10-CM

## 2017-09-11 DIAGNOSIS — R05 Cough: Secondary | ICD-10-CM

## 2017-09-11 DIAGNOSIS — R059 Cough, unspecified: Secondary | ICD-10-CM

## 2017-09-11 DIAGNOSIS — Z720 Tobacco use: Secondary | ICD-10-CM

## 2017-09-11 DIAGNOSIS — I428 Other cardiomyopathies: Secondary | ICD-10-CM

## 2017-09-11 DIAGNOSIS — I482 Chronic atrial fibrillation: Secondary | ICD-10-CM

## 2017-09-11 MED ORDER — AZITHROMYCIN 250 MG PO TABS: ORAL_TABLET | ORAL | 0 refills | Status: DC

## 2017-09-11 NOTE — Patient Instructions (Signed)
Medication Instructions:  Continue current medications  If you need a refill on your cardiac medications before your next appointment, please call your pharmacy.  Labwork: None Ordered   Testing/Procedures: None Ordered  Follow-Up: Your physician wants you to follow-up in: 4 Month.     Thank you for choosing [facility] at [facility]!!

## 2017-10-28 ENCOUNTER — Other Ambulatory Visit (INDEPENDENT_AMBULATORY_CARE_PROVIDER_SITE_OTHER): Payer: Medicare Other

## 2017-10-28 DIAGNOSIS — E041 Nontoxic single thyroid nodule: Secondary | ICD-10-CM

## 2017-10-28 LAB — T4, FREE: FREE T4: 0.6 ng/dL (ref 0.60–1.60)

## 2017-10-28 LAB — ALT: ALT: 13 U/L (ref 0–35)

## 2017-10-28 LAB — T3, FREE: T3, Free: 3.8 pg/mL (ref 2.3–4.2)

## 2017-10-28 LAB — TSH: TSH: 0.28 u[IU]/mL — AB (ref 0.35–4.50)

## 2017-10-30 ENCOUNTER — Other Ambulatory Visit: Payer: Medicare Other

## 2017-11-04 ENCOUNTER — Encounter: Payer: Self-pay | Admitting: Endocrinology

## 2017-11-04 ENCOUNTER — Ambulatory Visit (INDEPENDENT_AMBULATORY_CARE_PROVIDER_SITE_OTHER): Payer: Medicare Other | Admitting: Endocrinology

## 2017-11-04 VITALS — BP 128/86 | HR 84 | Ht 61.0 in | Wt 136.0 lb

## 2017-11-04 DIAGNOSIS — E041 Nontoxic single thyroid nodule: Secondary | ICD-10-CM

## 2017-11-04 NOTE — Progress Notes (Signed)
Patient ID: Rhonda Marshall, female   DOB: 1951-03-15, 67 y.o.   MRN: [id number]                                                                                                                Reason for Appointment:  Hyperthyroidism, follow-up    Chief complaint: Check thyroid   History of Present Illness:   Review of her records for thyroid History indicates that she reportedly had subclinical hyperthyroidism in 2012 when she was admitted for new onset atrial fibrillation Subsequently did not have any treatment or further evaluation She has been treated for atrial fibrillation by her cardiologist and was recommended evaluation for the thyroid recently  The patient is a poor historian and on her initial visit she did not give any symptoms of palpitations, shakiness, feeling excessively warm and sweaty or weight loss.  She did complain of nervousness and fatigue.  However she has also had issues with anxiety and depression Since her baseline TSH was very low along with upper normal free T3 she has been started on PTU 50 mg twice a day Baseline thyrotropin receptor antibody was negative With starting treatment she had less fatigue symptoms  She was scheduled for I-131 treatment because of the finding of a large hot nodule on the left side  However she could not do this since she was not able to be in isolation for a couple of days at home   RECENT history:   In 03/2017 when she was off her antithyroid drugs she was not having any symptoms like palpitations, however she was having some fatigue and shortness of breath and was having more problems with her tachycardia Her free T3 was up above normal at 4.3 at the same time  She was started on PTU at that time  She has continued on PTU 50 mg 2 times a day unchanged since 05/2017  She does not complain of any unusual fatigue, shortness of breath on exertion, palpitations Her weight is slightly higher   Wt Readings from Last 3  Encounters:  11/04/17 136 lb (61.7 kg)  09/11/17 133 lb 12.8 oz (60.7 kg)  08/12/17 133 lb 12.8 oz (60.7 kg)     Recent thyroid levels show her free T4 is still low normal but her Free T3 is upper normal again TSH is 0.28, previously 0.22  Thyroid function tests as follows:     Lab Results  Component Value Date   FREET4 0.60 10/28/2017   FREET4 0.62 08/07/2017   FREET4 0.56 (L) 06/11/2017   T3FREE 3.8 10/28/2017   T3FREE 3.6 08/07/2017   T3FREE 3.5 06/11/2017   TSH 0.28 (L) 10/28/2017   TSH 0.22 (L) 08/07/2017   TSH 0.60 06/11/2017    Lab Results  Component Value Date   THYROTRECAB <0.50 12/24/2016     Allergies as of 11/04/2017      Reactions   Clindamycin/lincomycin Rash   Latex Rash   Latex gloves      Medication List  Accurate as of 11/04/17  3:45 PM. Always use your most recent med list.          ALPRAZolam 1 MG tablet Commonly known as:  XANAX Take 1 mg by mouth 3 (three) times daily as needed for anxiety.   diltiazem 120 MG 24 hr capsule Commonly known as:  CARDIZEM CD Take 1 capsule (120 mg total) by mouth daily.   ELIQUIS 5 MG Tabs tablet Generic drug:  apixaban Take 1 tablet (5 mg total) by mouth 2 (two) times daily.   metoprolol 200 MG 24 hr tablet Commonly known as:  TOPROL-XL Take 1 tablet (200 mg total) by mouth 2 (two) times daily.   oxyCODONE-acetaminophen 5-325 MG tablet Commonly known as:  PERCOCET/ROXICET Take 1 tablet by mouth every 4 (four) hours as needed for severe pain.   propylthiouracil 50 MG tablet Commonly known as:  PTU TAKE ONE TABLET TWICE DAILY           Past Medical History:  Diagnosis Date  . Anxiety   . Atrial fibrillation ([place])    newly diagnosed 10/2010  . Family history of adverse reaction to anesthesia    Sister's BP drops   . Hypertension   . Rheumatoid arthritis(714.0)   . Subclinical hyperthyroidism     Past Surgical History:  Procedure Laterality Date  . Arm surgery     fracture,  right arm  . CARDIOVERSION N/A 08/10/2014   Procedure: CARDIOVERSION;  Surgeon: Dorothy Spark, MD;  Location: [place];  Service: Cardiovascular;  Laterality: N/A;  . CATARACT EXTRACTION    . TUBAL LIGATION      Family History  Problem Relation Age of Onset  . Heart attack Mother 5       pacemaker age 70  . Hypothyroidism Mother   . Cancer Father        colon and prostate  . Diabetes Brother   . CAD Brother 5  . Cancer Brother        Renal  . Hypothyroidism Sister     Social History:  reports that she has been smoking cigarettes.  She has never used smokeless tobacco. She reports that she drinks alcohol. She reports that she does not use drugs.  Allergies:  Allergies  Allergen Reactions  . Clindamycin/Lincomycin Rash  . Latex Rash    Latex gloves     Review of Systems  She has chronic atrial fibrillation followed by cardiologist regularly On Eliquis Currently on metoprolol 200 mg daily for rate control   Examination:   BP 128/86 (BP Location: Left Arm, Patient Position: Sitting, Cuff Size: Normal)   Pulse 84   Ht 5\' 1"  (1.549 m)   Wt 136 lb (61.7 kg)   SpO2 97%   BMI 25.70 kg/m   She looks well Heart rate about 70-80 with irregular rhythm Left thyroid lobe just palpable on swallowing, smooth Reflexes are normal on the right biceps No peripheral edema    Assessment/Plan:   Hyperthyroidism, mild and Secondary to a hot nodule on the left side  Also on exam has multinodular goiter  Her symptoms with the hyperthyroidism had been nonspecific, causing mild fatigue Usually heart rate is not affected because of her being on high-dose beta-blockers  She had at baseline T3 toxicosis which is now controlled with only 50 mg twice a day of PTU Subjectively doing well  Although her free T4 is low normal this is about the same  TSH is low normal but not as suppressed  Advised her to continue her low dose of PTU 50 mg twice a day   She will come back  in 3 months for follow-up She still does not think she can do the I-131 treatment because of being in close proximity to family members at home  Elayne Snare 11/04/2017, 3:45 PM    Note: This office note was prepared with Dragon voice recognition system technology. Any transcriptional errors that result from this process are unintentional.

## 2017-11-06 ENCOUNTER — Other Ambulatory Visit: Payer: Self-pay | Admitting: Endocrinology

## 2017-12-29 ENCOUNTER — Other Ambulatory Visit: Payer: Self-pay | Admitting: Cardiology

## 2017-12-31 ENCOUNTER — Other Ambulatory Visit: Payer: Self-pay | Admitting: Cardiology

## 2017-12-31 NOTE — Telephone Encounter (Signed)
Rx(s) sent to pharmacy electronically.  

## 2018-01-29 ENCOUNTER — Other Ambulatory Visit (INDEPENDENT_AMBULATORY_CARE_PROVIDER_SITE_OTHER): Payer: Medicare Other

## 2018-01-29 DIAGNOSIS — E041 Nontoxic single thyroid nodule: Secondary | ICD-10-CM | POA: Diagnosis not present

## 2018-01-29 LAB — T3, FREE: T3 FREE: 4.1 pg/mL (ref 2.3–4.2)

## 2018-01-29 LAB — TSH: TSH: 0.15 u[IU]/mL — ABNORMAL LOW (ref 0.35–4.50)

## 2018-01-29 LAB — T4, FREE: Free T4: 0.7 ng/dL (ref 0.60–1.60)

## 2018-02-01 ENCOUNTER — Other Ambulatory Visit: Payer: Self-pay | Admitting: Endocrinology

## 2018-02-02 ENCOUNTER — Other Ambulatory Visit: Payer: Self-pay | Admitting: Cardiology

## 2018-02-04 ENCOUNTER — Encounter: Payer: Self-pay | Admitting: Endocrinology

## 2018-02-04 ENCOUNTER — Ambulatory Visit (INDEPENDENT_AMBULATORY_CARE_PROVIDER_SITE_OTHER): Payer: Medicare Other | Admitting: Endocrinology

## 2018-02-04 VITALS — BP 110/70 | HR 80 | Temp 97.7°F | Ht 61.0 in | Wt 140.0 lb

## 2018-02-04 DIAGNOSIS — E041 Nontoxic single thyroid nodule: Secondary | ICD-10-CM

## 2018-02-04 NOTE — Patient Instructions (Signed)
Take PTU 3x daily

## 2018-02-04 NOTE — Progress Notes (Signed)
Patient ID: Rhonda Marshall, female   DOB: 1951/02/08, 67 y.o.   MRN: [id number]                                                                                                                Reason for Appointment:  Hyperthyroidism, follow-up    Chief complaint: Check thyroid   History of Present Illness:   Review of her records for thyroid History indicates that she reportedly had subclinical hyperthyroidism in 2012 when she was admitted for new onset atrial fibrillation Subsequently did not have any treatment or further evaluation She has been treated for atrial fibrillation by her cardiologist and was recommended evaluation for the thyroid recently  The patient is a poor historian and on her initial visit she did not give any symptoms of palpitations, shakiness, feeling excessively warm and sweaty or weight loss.  She did complain of nervousness and fatigue.  However she has also had issues with anxiety and depression Since her baseline TSH was very low along with upper normal free T3 she has been started on PTU 50 mg twice a day Baseline thyrotropin receptor antibody was negative With starting treatment she had less fatigue symptoms  She was scheduled for I-131 treatment because of the finding of a large hot nodule on the left side  However she could not do this since she was not able to be in isolation for a couple of days at home   RECENT history:   In 03/2017 when she was off her antithyroid drugs she was not having any symptoms like palpitations, however she was having some fatigue and shortness of breath and was having more problems with her tachycardia Her free T3 was up above normal at 4.3 at the same time  She has treated with PTU 50 mg 2 times a day unchanged since 05/2017  She does not complain of any unusual fatigue, shortness of breath on exertion, shakiness or palpitations Her weight is higher again   Wt Readings from Last 3 Encounters:  02/04/18 140 lb (63.5  kg)  11/04/17 136 lb (61.7 kg)  09/11/17 133 lb 12.8 oz (60.7 kg)     Recent thyroid levels show her free T4 is quite normal and slightly higher than before Free T3 is high normal and relatively higher again TSH is relatively lower at 0.15   Thyroid function tests as follows:     Lab Results  Component Value Date   FREET4 0.70 01/29/2018   FREET4 0.60 10/28/2017   FREET4 0.62 08/07/2017   T3FREE 4.1 01/29/2018   T3FREE 3.8 10/28/2017   T3FREE 3.6 08/07/2017   TSH 0.15 (L) 01/29/2018   TSH 0.28 (L) 10/28/2017   TSH 0.22 (L) 08/07/2017    Lab Results  Component Value Date   THYROTRECAB <0.50 12/24/2016     Allergies as of 02/04/2018      Reactions   Clindamycin/lincomycin Rash   Latex Rash   Latex gloves      Medication List  Accurate as of 02/04/18  3:40 PM. Always use your most recent med list.          ALPRAZolam 1 MG tablet Commonly known as:  XANAX Take 1 mg by mouth 3 (three) times daily as needed for anxiety.   diltiazem 120 MG 24 hr capsule Commonly known as:  CARDIZEM CD Take 1 capsule (120 mg total) by mouth daily.   ELIQUIS 5 MG Tabs tablet Generic drug:  apixaban Take 1 tablet (5 mg total) by mouth 2 (two) times daily.   metoprolol 200 MG 24 hr tablet Commonly known as:  TOPROL-XL Take 1 tablet (200 mg total) by mouth 2 (two) times daily.   oxyCODONE-acetaminophen 5-325 MG tablet Commonly known as:  PERCOCET/ROXICET Take 1 tablet by mouth every 4 (four) hours as needed for severe pain.   propylthiouracil 50 MG tablet Commonly known as:  PTU TAKE ONE TABLET TWICE DAILY           Past Medical History:  Diagnosis Date  . Anxiety   . Atrial fibrillation ([place])    newly diagnosed 10/2010  . Family history of adverse reaction to anesthesia    Sister's BP drops   . Hypertension   . Rheumatoid arthritis(714.0)   . Subclinical hyperthyroidism     Past Surgical History:  Procedure Laterality Date  . Arm surgery     fracture,  right arm  . CARDIOVERSION N/A 08/10/2014   Procedure: CARDIOVERSION;  Surgeon: Dorothy Spark, MD;  Location: [facility];  Service: Cardiovascular;  Laterality: N/A;  . CATARACT EXTRACTION    . TUBAL LIGATION      Family History  Problem Relation Age of Onset  . Heart attack Mother 10       pacemaker age 13  . Hypothyroidism Mother   . Cancer Father        colon and prostate  . Diabetes Brother   . CAD Brother 38  . Cancer Brother        Renal  . Hypothyroidism Sister     Social History:  reports that she has been smoking cigarettes.  She has never used smokeless tobacco. She reports that she drinks alcohol. She reports that she does not use drugs.  Allergies:  Allergies  Allergen Reactions  . Clindamycin/Lincomycin Rash  . Latex Rash    Latex gloves     Review of Systems  She has chronic atrial fibrillation followed by cardiologist regularly On Eliquis Currently on metoprolol 200 mg daily for rate control   Examination:   BP 110/70 (BP Location: Left Arm, Patient Position: Sitting, Cuff Size: Normal)   Pulse 80   Temp 97.7 F (36.5 C) (Oral)   Ht 5\' 1"  (1.549 m)   Wt 140 lb (63.5 kg)   SpO2 96%   BMI 26.45 kg/m   She looks well Thyroid not clearly palpable Reflexes are normal on the left biceps No skin changes, no tremor    Assessment/Plan:   Hyperthyroidism, mild and Secondary to a hot nodule on the left side  Also on exam has had a small multinodular goiter  Her symptoms with the hyperthyroidism had been nonspecific, causing mild fatigue shortness of breath Usually heart rate is not affected because of her being on high-dose beta-blockers  She had at baseline T3 toxicosis but her T3 level is relatively higher in upper normal now with taking PTU 50 mg twice daily which she is taking regularly  Since her T3 is trending higher and TSH is  slightly lower we will increase her PTU to 3 times a day  She will come back in 2 months for follow-up She  still does not think she can do the I-131 treatment because of being in close proximity to family members at home  Elayne Snare 02/04/2018, 3:40 PM    Note: This office note was prepared with Dragon voice recognition system technology. Any transcriptional errors that result from this process are unintentional.

## 2018-04-09 ENCOUNTER — Other Ambulatory Visit (INDEPENDENT_AMBULATORY_CARE_PROVIDER_SITE_OTHER): Payer: Medicare Other

## 2018-04-09 DIAGNOSIS — E041 Nontoxic single thyroid nodule: Secondary | ICD-10-CM | POA: Diagnosis not present

## 2018-04-09 LAB — T3, FREE: T3 FREE: 4 pg/mL (ref 2.3–4.2)

## 2018-04-09 LAB — T4, FREE: FREE T4: 0.7 ng/dL (ref 0.60–1.60)

## 2018-04-09 LAB — TSH: TSH: 1.42 u[IU]/mL (ref 0.35–4.50)

## 2018-04-13 ENCOUNTER — Ambulatory Visit (INDEPENDENT_AMBULATORY_CARE_PROVIDER_SITE_OTHER): Payer: Medicare Other | Admitting: Endocrinology

## 2018-04-13 ENCOUNTER — Encounter: Payer: Self-pay | Admitting: Endocrinology

## 2018-04-13 VITALS — BP 118/72 | HR 62 | Ht 61.0 in | Wt 139.0 lb

## 2018-04-13 DIAGNOSIS — E0520 Thyrotoxicosis with toxic multinodular goiter without thyrotoxic crisis or storm: Secondary | ICD-10-CM | POA: Diagnosis not present

## 2018-04-13 NOTE — Patient Instructions (Signed)
3 pills daily

## 2018-04-13 NOTE — Progress Notes (Signed)
Patient ID: Rhonda Marshall, female   DOB: 11-11-1950, 67 y.o.   MRN: [id number]                                                                                                                Reason for Appointment:  Hyperthyroidism, follow-up    Chief complaint: Follow-up for overactive thyroid   History of Present Illness:   Review of her records for thyroid History indicates that she reportedly had subclinical hyperthyroidism in 2012 when she was admitted for new onset atrial fibrillation Subsequently did not have any treatment or further evaluation She has been treated for atrial fibrillation by her cardiologist and was recommended evaluation for the thyroid recently  The patient is a poor historian and on her initial visit she did not give any symptoms of palpitations, shakiness, feeling excessively warm and sweaty or weight loss.  She did complain of nervousness and fatigue.   However she has also had issues with anxiety and depression Since her baseline TSH was very low along with upper normal free T3 she has been started on PTU 50 mg twice a day Baseline thyrotropin receptor antibody was negative With starting treatment she had less fatigue symptoms  She was scheduled for I-131 treatment because of the finding of a large hot nodule on the left side  However she could not do this since she was not able to be in isolation for a couple of days at home   RECENT history:   In 03/2017 when she was off her antithyroid drugs she was not having any symptoms like palpitations, however she was having some fatigue and shortness of breath and was having more problems with her tachycardia Her free T3 was up above normal at 4.3 at the same time  She has been treated with PTU 50 mg, now taking 3 times a day Although her thyroid levels showed upper normal free T3 in July and TSH down to 0.15 she was asymptomatic PT was increased in 7/19  She does not complain of new fatigue, is able to do  more activities without being as short of breath Recently recovering from a cough  Wt Readings from Last 3 Encounters:  04/13/18 139 lb (63 kg)  02/04/18 140 lb (63.5 kg)  11/04/17 136 lb (61.7 kg)     Now thyroid levels show her free T4 is the same and free T3 is also nearly unchanged at 4.0 However TSH is back to normal   Thyroid function tests as follows:     Lab Results  Component Value Date   FREET4 0.70 04/09/2018   FREET4 0.70 01/29/2018   FREET4 0.60 10/28/2017   T3FREE 4.0 04/09/2018   T3FREE 4.1 01/29/2018   T3FREE 3.8 10/28/2017   TSH 1.42 04/09/2018   TSH 0.15 (L) 01/29/2018   TSH 0.28 (L) 10/28/2017    Lab Results  Component Value Date   THYROTRECAB <0.50 12/24/2016     Allergies as of 04/13/2018      Reactions   Clindamycin/lincomycin Rash  Latex Rash   Latex gloves      Medication List        Accurate as of 04/13/18  1:14 PM. Always use your most recent med list.          ALPRAZolam 1 MG tablet Commonly known as:  XANAX Take 1 mg by mouth 3 (three) times daily as needed for anxiety.   diltiazem 120 MG 24 hr capsule Commonly known as:  CARDIZEM CD Take 1 capsule (120 mg total) by mouth daily.   ELIQUIS 5 MG Tabs tablet Generic drug:  apixaban Take 1 tablet (5 mg total) by mouth 2 (two) times daily.   metoprolol 200 MG 24 hr tablet Commonly known as:  TOPROL-XL Take 1 tablet (200 mg total) by mouth 2 (two) times daily.   oxyCODONE-acetaminophen 5-325 MG tablet Commonly known as:  PERCOCET/ROXICET Take 1 tablet by mouth every 4 (four) hours as needed for severe pain.   propylthiouracil 50 MG tablet Commonly known as:  PTU TAKE ONE TABLET TWICE DAILY           Past Medical History:  Diagnosis Date  . Anxiety   . Atrial fibrillation ([place])    newly diagnosed 10/2010  . Family history of adverse reaction to anesthesia    Sister's BP drops   . Hypertension   . Rheumatoid arthritis(714.0)   . Subclinical hyperthyroidism      Past Surgical History:  Procedure Laterality Date  . Arm surgery     fracture, right arm  . CARDIOVERSION N/A 08/10/2014   Procedure: CARDIOVERSION;  Surgeon: Dorothy Spark, MD;  Location: [facility];  Service: Cardiovascular;  Laterality: N/A;  . CATARACT EXTRACTION    . TUBAL LIGATION      Family History  Problem Relation Age of Onset  . Heart attack Mother 51       pacemaker age 73  . Hypothyroidism Mother   . Cancer Father        colon and prostate  . Diabetes Brother   . CAD Brother 52  . Cancer Brother        Renal  . Hypothyroidism Sister     Social History:  reports that she has been smoking cigarettes. She has never used smokeless tobacco. She reports that she drinks alcohol. She reports that she does not use drugs.  Allergies:  Allergies  Allergen Reactions  . Clindamycin/Lincomycin Rash  . Latex Rash    Latex gloves     Review of Systems  She has chronic atrial fibrillation followed by cardiologist On Eliquis Continues on metoprolol 200 mg daily for rate control   Examination:   BP 118/72   Pulse 62   Ht 5\' 1"  (1.549 m)   Wt 139 lb (63 kg)   SpO2 98%   BMI 26.26 kg/m   She looks well Thyroid is palpable on the left from posterior approach She has a nodular right lobe of thyroid which is slightly firm and at least twice normal in size  Reflexes are normal on the l biceps area No diaphoresis, no tremor    Assessment/Plan:   Hyperthyroidism, mild and Secondary to nodular goiter on the left side  Also on exam has had a left-sided multinodular goiter  Her symptoms with the hyperthyroidism had been nonspecific, mostly in the form of mild fatigue and some shortness of breath Her heart rate is always under control with high-dose metoprolol  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 Any transcriptional errors that result from this process are unintentional.

## 2018-04-21 ENCOUNTER — Other Ambulatory Visit: Payer: Self-pay | Admitting: Endocrinology

## 2018-06-17 ENCOUNTER — Other Ambulatory Visit (INDEPENDENT_AMBULATORY_CARE_PROVIDER_SITE_OTHER): Payer: Medicare Other

## 2018-06-17 DIAGNOSIS — E0520 Thyrotoxicosis with toxic multinodular goiter without thyrotoxic crisis or storm: Secondary | ICD-10-CM | POA: Diagnosis not present

## 2018-06-17 LAB — COMPREHENSIVE METABOLIC PANEL
ALT: 13 U/L (ref 0–35)
AST: 13 U/L (ref 0–37)
Albumin: 4.4 g/dL (ref 3.5–5.2)
Alkaline Phosphatase: 68 U/L (ref 39–117)
BILIRUBIN TOTAL: 0.4 mg/dL (ref 0.2–1.2)
BUN: 24 mg/dL — ABNORMAL HIGH (ref 6–23)
CALCIUM: 9.8 mg/dL (ref 8.4–10.5)
CO2: 27 meq/L (ref 19–32)
Chloride: 101 mEq/L (ref 96–112)
Creatinine, Ser: 1.35 mg/dL — ABNORMAL HIGH (ref 0.40–1.20)
GFR: 41.36 mL/min — ABNORMAL LOW (ref >60.00)
Glucose, Bld: 102 mg/dL — ABNORMAL HIGH (ref 70–99)
Potassium: 4.1 mEq/L (ref 3.5–5.1)
Sodium: 135 mEq/L (ref 135–145)
Total Protein: 7.4 g/dL (ref 6.0–8.3)

## 2018-06-17 LAB — CBC
HCT: 43.1 % (ref 36.0–46.0)
Hemoglobin: 14.5 g/dL (ref 12.0–15.0)
MCHC: 33.7 g/dL (ref 30.0–36.0)
MCV: 99.3 fl (ref 78.0–100.0)
Platelets: 255 10*3/uL (ref 150.0–400.0)
RBC: 4.34 Mil/uL (ref 3.87–5.11)
RDW: 13.7 % (ref 11.5–15.5)
WBC: 7.5 10*3/uL (ref 4.0–10.5)

## 2018-06-17 LAB — TSH: TSH: 2.59 u[IU]/mL (ref 0.35–4.50)

## 2018-06-17 LAB — T3, FREE: T3 FREE: 4.2 pg/mL (ref 2.3–4.2)

## 2018-06-17 LAB — T4, FREE: FREE T4: 0.61 ng/dL (ref 0.60–1.60)

## 2018-06-21 NOTE — Progress Notes (Signed)
Patient ID: Rhonda Marshall, female   DOB: [birth year], 67 y.o.   MRN: [id number]                                                                                                                Reason for Appointment:  Hyperthyroidism, follow-up    Chief complaint: Follow-up for overactive thyroid   History of Present Illness:   Review of her records for thyroid History indicates that she reportedly had subclinical hyperthyroidism in 2012 when she was admitted for new onset atrial fibrillation Subsequently did not have any treatment or further evaluation She has been treated for atrial fibrillation by her cardiologist and was recommended evaluation for the thyroid recently  The patient is a poor historian and on her initial visit she did not give any symptoms of palpitations, shakiness, feeling excessively warm and sweaty or weight loss.  She did complain of nervousness and fatigue.   However she has also had issues with anxiety and depression Since her baseline TSH was very low along with upper normal free T3 she has been started on PTU 50 mg twice a day Baseline thyrotropin receptor antibody was negative With starting treatment she had less fatigue symptoms  She was scheduled for I-131 treatment because of the finding of a large hot nodule on the left side  However she could not do this since she was not able to be in isolation for a couple of days at home   RECENT history:   In 03/2017 when she was off her antithyroid drugs she was not having any symptoms like palpitations, however she was having some fatigue and shortness of breath and was having more problems with her tachycardia Her free T3 was up above normal at 4.3 at the same time  She has been treated with PTU 50 mg,  taking 3 times a day She says that she is very compliant with taking this  She is here for 2-month follow-up Her TSH has come back to normal and is relatively higher at 2.6  Also even though free T3 is upper  normal her free T4 is low normal now compared to previous levels  She feels fairly good overall and is able to be relatively active without shortness of breath or fatigue   Wt Readings from Last 3 Encounters:  06/22/18 141 lb 12.8 oz (64.3 kg)  04/13/18 139 lb (63 kg)  02/04/18 140 lb (63.5 kg)      Thyroid function tests as follows:     Lab Results  Component Value Date   FREET4 0.61 06/17/2018   FREET4 0.70 04/09/2018   FREET4 0.70 01/29/2018   T3FREE 4.2 06/17/2018   T3FREE 4.0 04/09/2018   T3FREE 4.1 01/29/2018   TSH 2.59 06/17/2018   TSH 1.42 04/09/2018   TSH 0.15 (L) 01/29/2018    Lab Results  Component Value Date   THYROTRECAB <0.50 12/24/2016     Allergies as of 06/22/2018      Reactions   Clindamycin/lincomycin Rash   Latex Rash  Latex gloves      Medication List        Accurate as of 06/22/18  1:11 PM. Always use your most recent med list.          ALPRAZolam 1 MG tablet Commonly known as:  XANAX Take 1 mg by mouth 3 (three) times daily as needed for anxiety.   diltiazem 120 MG 24 hr capsule Commonly known as:  CARDIZEM CD Take 1 capsule (120 mg total) by mouth daily.   ELIQUIS 5 MG Tabs tablet Generic drug:  apixaban Take 1 tablet (5 mg total) by mouth 2 (two) times daily.   metoprolol 200 MG 24 hr tablet Commonly known as:  TOPROL-XL Take 1 tablet (200 mg total) by mouth 2 (two) times daily.   oxyCODONE-acetaminophen 5-325 MG tablet Commonly known as:  PERCOCET/ROXICET Take 1 tablet by mouth every 4 (four) hours as needed for severe pain.   propylthiouracil 50 MG tablet Commonly known as:  PTU TAKE ONE TABLET TWICE DAILY           Past Medical History:  Diagnosis Date  . Anxiety   . Atrial fibrillation ([place])    newly diagnosed 10/2010  . Family history of adverse reaction to anesthesia    Sister's BP drops   . Hypertension   . Rheumatoid arthritis(714.0)   . Subclinical hyperthyroidism     Past Surgical History:    Procedure Laterality Date  . Arm surgery     fracture, right arm  . CARDIOVERSION N/A 08/10/2014   Procedure: CARDIOVERSION;  Surgeon: Dorothy Spark, MD;  Location: [facility];  Service: Cardiovascular;  Laterality: N/A;  . CATARACT EXTRACTION    . TUBAL LIGATION      Family History  Problem Relation Age of Onset  . Heart attack Mother 37       pacemaker age 110  . Hypothyroidism Mother   . Cancer Father        colon and prostate  . Diabetes Brother   . CAD Brother 20  . Cancer Brother        Renal  . Hypothyroidism Sister     Social History:  reports that she has been smoking cigarettes. She has never used smokeless tobacco. She reports that she drinks alcohol. She reports that she does not use drugs.  Allergies:  Allergies  Allergen Reactions  . Clindamycin/Lincomycin Rash  . Latex Rash    Latex gloves     Review of Systems  She has chronic atrial fibrillation followed by cardiologist On Eliquis Continues on metoprolol 200 mg daily for rate control   Examination:   BP 122/80 (BP Location: Left Arm, Patient Position: Sitting, Cuff Size: Normal)   Pulse 70   Ht 5\' 1"  (1.549 m)   Wt 141 lb 12.8 oz (64.3 kg)   SpO2 97%   BMI 26.79 kg/m   She looks well Reflexes are normal    Assessment/Plan:   Hyperthyroidism, mild and Secondary to nodular goiter on the left side  Also on exam has had a left-sided multinodular goiter  Her symptoms with the hyperthyroidism had been nonspecific, mostly in the form of mild fatigue and some shortness of breath Also has history of atrial fibrillation Her heart rate is under control with high-dose metoprolol  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 Any transcriptional errors that result from this process are unintentional.

## 2018-06-22 ENCOUNTER — Encounter: Payer: Self-pay | Admitting: Endocrinology

## 2018-06-22 ENCOUNTER — Ambulatory Visit (INDEPENDENT_AMBULATORY_CARE_PROVIDER_SITE_OTHER): Payer: Medicare Other | Admitting: Endocrinology

## 2018-06-22 VITALS — BP 122/80 | HR 70 | Ht 61.0 in | Wt 141.8 lb

## 2018-06-22 DIAGNOSIS — E041 Nontoxic single thyroid nodule: Secondary | ICD-10-CM | POA: Diagnosis not present

## 2018-06-22 NOTE — Patient Instructions (Addendum)
Take only 2 pills of PTU

## 2018-06-23 ENCOUNTER — Other Ambulatory Visit: Payer: Self-pay | Admitting: Cardiology

## 2018-07-20 ENCOUNTER — Other Ambulatory Visit: Payer: Self-pay | Admitting: Endocrinology

## 2018-09-07 ENCOUNTER — Other Ambulatory Visit: Payer: Self-pay

## 2018-09-07 ENCOUNTER — Other Ambulatory Visit (INDEPENDENT_AMBULATORY_CARE_PROVIDER_SITE_OTHER): Payer: Medicare Other

## 2018-09-07 DIAGNOSIS — E041 Nontoxic single thyroid nodule: Secondary | ICD-10-CM | POA: Diagnosis not present

## 2018-09-07 LAB — T4, FREE: Free T4: 0.68 ng/dL (ref 0.60–1.60)

## 2018-09-07 LAB — TSH: TSH: 0.3 u[IU]/mL — ABNORMAL LOW (ref 0.35–4.50)

## 2018-09-08 LAB — T3: T3 TOTAL: 137 ng/dL (ref 71–180)

## 2018-09-09 ENCOUNTER — Other Ambulatory Visit: Payer: Self-pay

## 2018-09-09 ENCOUNTER — Encounter: Payer: Self-pay | Admitting: Endocrinology

## 2018-09-09 ENCOUNTER — Other Ambulatory Visit: Payer: Self-pay | Admitting: Cardiology

## 2018-09-09 ENCOUNTER — Ambulatory Visit (INDEPENDENT_AMBULATORY_CARE_PROVIDER_SITE_OTHER): Payer: Medicare Other | Admitting: Endocrinology

## 2018-09-09 VITALS — BP 130/80 | HR 79 | Ht 61.0 in | Wt 139.4 lb

## 2018-09-09 DIAGNOSIS — E0520 Thyrotoxicosis with toxic multinodular goiter without thyrotoxic crisis or storm: Secondary | ICD-10-CM

## 2018-09-09 NOTE — Patient Instructions (Signed)
Take thyroid pill 2x daily

## 2018-09-09 NOTE — Progress Notes (Signed)
Patient ID: Rhonda Marshall, female   DOB: 1950/12/22, 68 y.o.   MRN: [id number]                                                                                                                Reason for Appointment:  Hyperthyroidism, follow-up    Chief complaint: Follow-up for overactive thyroid   History of Present Illness:   Review of her records for thyroid History indicates that she reportedly had subclinical hyperthyroidism in 2012 when she was admitted for new onset atrial fibrillation Subsequently did not have any treatment or further evaluation She has been treated for atrial fibrillation by her cardiologist and was recommended evaluation for the thyroid recently  The patient is a poor historian and on her initial visit she did not give any symptoms of palpitations, shakiness, feeling excessively warm and sweaty or weight loss.  She did complain of nervousness and fatigue.   However she has also had issues with anxiety and depression Since her baseline TSH was very low along with upper normal free T3 she has been on PTU 50 mg twice a day since October 2017 Baseline thyrotropin receptor antibody was negative With starting treatment she had less fatigue symptoms  She was scheduled for I-131 treatment because of the finding of a large hot nodule on the left side  However she could not do this since she was not able to be in isolation for a couple of days at home   RECENT history:   In 03/2017 when she was off her antithyroid drugs she was not having any symptoms like palpitations, however she was having some fatigue and shortness of breath and was having more problems with her tachycardia Her free T3 was up above normal at 4.3 at the same time  She has been treated with PTU 50 mg,  taking this now only 1 time a day She says that she is very compliant with taking this  She is here for 64-month follow-up Her TSH in 12/19 was relatively higher at 2.6 along with low normal free  T4 She was on PTU twice a day Although she was asymptomatic her dose was reduced to once a day which she takes in the mornings  She has not felt fatigued or shortness of breath lately No palpitations  Her thyroid levels are overall normal but her TSH is now slightly below normal   Wt Readings from Last 3 Encounters:  09/09/18 139 lb 6.4 oz (63.2 kg)  06/22/18 141 lb 12.8 oz (64.3 kg)  04/13/18 139 lb (63 kg)      Thyroid function tests as follows:     Lab Results  Component Value Date   FREET4 0.68 09/07/2018   FREET4 0.61 06/17/2018   FREET4 0.70 04/09/2018   T3FREE 4.2 06/17/2018   T3FREE 4.0 04/09/2018   T3FREE 4.1 01/29/2018   TSH 0.30 (L) 09/07/2018   TSH 2.59 06/17/2018   TSH 1.42 04/09/2018    Lab Results  Component Value Date   THYROTRECAB <0.50  12/24/2016     Allergies as of 09/09/2018      Reactions   Clindamycin/lincomycin Rash   Latex Rash   Latex gloves      Medication List       Accurate as of September 09, 2018  3:45 PM. Always use your most recent med list.        ALPRAZolam 1 MG tablet Commonly known as:  XANAX Take 1 mg by mouth 3 (three) times daily as needed for anxiety.   diltiazem 120 MG 24 hr capsule Commonly known as:  CARDIZEM CD Take 1 capsule (120 mg total) by mouth daily.   ELIQUIS 5 MG Tabs tablet Generic drug:  apixaban Take 1 tablet (5 mg total) by mouth 2 (two) times daily.   metoprolol 200 MG 24 hr tablet Commonly known as:  TOPROL-XL Take 1 tablet (200 mg total) by mouth 2 (two) times daily.   oxyCODONE-acetaminophen 5-325 MG tablet Commonly known as:  PERCOCET/ROXICET Take 1 tablet by mouth every 4 (four) hours as needed for severe pain.   propylthiouracil 50 MG tablet Commonly known as:  PTU TAKE ONE TABLET TWICE DAILY           Past Medical History:  Diagnosis Date  . Anxiety   . Atrial fibrillation ([place])    newly diagnosed 10/2010  . Family history of adverse reaction to anesthesia    Sister's BP  drops   . Hypertension   . Rheumatoid arthritis(714.0)   . Subclinical hyperthyroidism     Past Surgical History:  Procedure Laterality Date  . Arm surgery     fracture, right arm  . CARDIOVERSION N/A 08/10/2014   Procedure: CARDIOVERSION;  Surgeon: Dorothy Spark, MD;  Location: [facility];  Service: Cardiovascular;  Laterality: N/A;  . CATARACT EXTRACTION    . TUBAL LIGATION      Family History  Problem Relation Age of Onset  . Heart attack Mother 41       pacemaker age 60  . Hypothyroidism Mother   . Cancer Father        colon and prostate  . Diabetes Brother   . CAD Brother 30  . Cancer Brother        Renal  . Hypothyroidism Sister     Social History:  reports that she has been smoking cigarettes. She has never used smokeless tobacco. She reports current alcohol use. She reports that she does not use drugs.  Allergies:  Allergies  Allergen Reactions  . Clindamycin/Lincomycin Rash  . Latex Rash    Latex gloves     Review of Systems  She has chronic atrial fibrillation followed by cardiologist, she appears to be overdue for her visit  Continues on metoprolol 200 mg daily for rate control   Examination:   BP 130/80 (BP Location: Left Arm, Patient Position: Sitting, Cuff Size: Normal)   Pulse 79   Ht 5\' 1"  (1.549 m)   Wt 139 lb 6.4 oz (63.2 kg)   SpO2 96%   BMI 26.34 kg/m   She looks well  Heart rate on auscultation is about 84, irregular  Thyroid is enlarged at least twice normal and nodular on the left, generally better felt on swallowing Right side is about 1-1/2 times normal and firm, slightly irregular Reflexes are normal at the biceps    Assessment/Plan:   Hyperthyroidism, mild and Secondary to hot nodule on the left Also on exam has had a multinodular goiter, larger on the left  Her  symptoms with the hyperthyroidism had been nonspecific, mostly in the form of mild fatigue and some shortness of breath Also has history of atrial  fibrillation  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  She generally has been controlled with low-dose PTU Although TSH was rising and her free T4 was low normal on the last visit reducing the dose to once a day and of PTU has appeared to increase her heart rate TSH is slightly lower than normal Currently T3 level is mid normal  With her TSH relatively lower and her heart rate being slightly faster will go back to twice a day PTU and she will follow-up in 2 months  As before if she is able to do I-131 treatment that would be ideal for ablation of her hot nodule  Elayne Snare 09/09/2018, 3:45 PM    Note: This office note was prepared with Dragon voice recognition system technology. Any transcriptional errors that result from this process are unintentional.

## 2018-09-09 NOTE — Telephone Encounter (Signed)
Rx(s) sent to pharmacy electronically.  

## 2018-10-04 ENCOUNTER — Other Ambulatory Visit: Payer: Self-pay | Admitting: Endocrinology

## 2018-11-08 ENCOUNTER — Other Ambulatory Visit: Payer: Medicare Other

## 2018-11-11 ENCOUNTER — Ambulatory Visit: Payer: Medicare Other | Admitting: Endocrinology

## 2018-11-22 ENCOUNTER — Other Ambulatory Visit: Payer: Self-pay | Admitting: Cardiology

## 2018-12-14 ENCOUNTER — Other Ambulatory Visit: Payer: Self-pay | Admitting: Cardiology

## 2018-12-20 ENCOUNTER — Other Ambulatory Visit: Payer: Self-pay

## 2018-12-20 ENCOUNTER — Other Ambulatory Visit (INDEPENDENT_AMBULATORY_CARE_PROVIDER_SITE_OTHER): Payer: Medicare Other

## 2018-12-20 DIAGNOSIS — E0520 Thyrotoxicosis with toxic multinodular goiter without thyrotoxic crisis or storm: Secondary | ICD-10-CM

## 2018-12-20 LAB — T4, FREE: Free T4: 0.79 ng/dL (ref 0.60–1.60)

## 2018-12-20 LAB — TSH: TSH: 0.54 u[IU]/mL (ref 0.35–4.50)

## 2018-12-21 LAB — T3: T3, Total: 158 ng/dL (ref 71–180)

## 2018-12-23 ENCOUNTER — Other Ambulatory Visit: Payer: Self-pay

## 2018-12-23 ENCOUNTER — Encounter: Payer: Self-pay | Admitting: Endocrinology

## 2018-12-23 ENCOUNTER — Ambulatory Visit (INDEPENDENT_AMBULATORY_CARE_PROVIDER_SITE_OTHER): Payer: Medicare Other | Admitting: Endocrinology

## 2018-12-23 DIAGNOSIS — E0520 Thyrotoxicosis with toxic multinodular goiter without thyrotoxic crisis or storm: Secondary | ICD-10-CM

## 2018-12-23 NOTE — Progress Notes (Signed)
Patient ID: Rhonda Marshall, female   DOB: [birth year], 68 y.o.   MRN: [id number]                                                                                                                Reason for Appointment:  Hyperthyroidism, follow-up  Today's office visit was provided via telemedicine using a telephone call to the patient Patient has been explained the limitations of evaluation and management by telemedicine and the availability of in person appointments.  The patient understood the limitations and agreed to proceed. Patient also understood that the telehealth visit is billable. . Location of the patient: Home . Location of the provider: Office Only the patient and myself were participating in the encounter   Chief complaint: Follow-up for overactive thyroid   History of Present Illness:   Review of her records for thyroid History indicates that she reportedly had subclinical hyperthyroidism in 2012 when she was admitted for new onset atrial fibrillation Subsequently did not have any treatment or further evaluation She has been treated for atrial fibrillation by her cardiologist and was recommended evaluation for the thyroid recently  The patient is a poor historian and on her initial visit she did not give any symptoms of palpitations, shakiness, feeling excessively warm and sweaty or weight loss.  She did complain of nervousness and fatigue.   However she has also had issues with anxiety and depression Since her baseline TSH was very low along with upper normal free T3 she has been on PTU 50 mg twice a day since October 2017 Baseline thyrotropin receptor antibody was negative With starting treatment she had less fatigue symptoms  She was scheduled for I-131 treatment because of the finding of a large hot nodule on the left side  However she could not do this since she was not able to be in isolation for a couple of days at home   RECENT history:   In 03/2017 when she  was off her antithyroid drugs she was not having any symptoms like palpitations, however she was having some fatigue and shortness of breath and was having more problems with her tachycardia Her free T3 was up above normal at 4.3 at the same time  She has been treated with PTU 50 mg, the dose ranging from 1 up to 2 a day  On her visit in 2/20 she was having relatively higher heart rate and her TSH was low normal at 0.3 She was told to increase her PTU to twice a day instead of once a day but she now states that she is only taking 1 a day Has not been seen in follow-up since then She takes her PTU consistently daily  He tends to get short of breath at times on exertion Otherwise does not feel she has any more fatigue, no shakiness or nervousness No recent weight loss, her home weight was 135 pounds  No palpitations  Her thyroid levels are higher now normal including TSH Free T4 and total T3 are normal  Wt Readings from Last 3 Encounters:  09/09/18 139 lb 6.4 oz (63.2 kg)  06/22/18 141 lb 12.8 oz (64.3 kg)  04/13/18 139 lb (63 kg)      Thyroid function tests as follows:     Lab Results  Component Value Date   FREET4 0.79 12/20/2018   FREET4 0.68 09/07/2018   FREET4 0.61 06/17/2018   T3FREE 4.2 06/17/2018   T3FREE 4.0 04/09/2018   T3FREE 4.1 01/29/2018   TSH 0.54 12/20/2018   TSH 0.30 (L) 09/07/2018   TSH 2.59 06/17/2018    Lab Results  Component Value Date   THYROTRECAB <0.50 12/24/2016     Allergies as of 12/23/2018      Reactions   Clindamycin/lincomycin Rash   Latex Rash   Latex gloves      Medication List       Accurate as of December 23, 2018 10:20 AM. If you have any questions, ask your nurse or doctor.        ALPRAZolam 1 MG tablet Commonly known as: XANAX Take 1 mg by mouth 3 (three) times daily as needed for anxiety.   diltiazem 120 MG 24 hr capsule Commonly known as: CARDIZEM CD Take 1 capsule (120 mg total) by mouth daily.   Eliquis 5 MG  Tabs tablet Generic drug: apixaban Take 1 tablet (5 mg total) by mouth 2 (two) times daily.   metoprolol 200 MG 24 hr tablet Commonly known as: TOPROL-XL Take 1 tablet (200 mg total) by mouth 2 (two) times daily.   oxyCODONE-acetaminophen 5-325 MG tablet Commonly known as: PERCOCET/ROXICET Take 1 tablet by mouth every 4 (four) hours as needed for severe pain.   propylthiouracil 50 MG tablet Commonly known as: PTU Take 1 tablet (50 mg total) by mouth daily. TAKE 1 TABLET BY MOUTH ONCE DAILY.           Past Medical History:  Diagnosis Date  . Anxiety   . Atrial fibrillation ([place])    newly diagnosed 10/2010  . Family history of adverse reaction to anesthesia    Sister's BP drops   . Hypertension   . Rheumatoid arthritis(714.0)   . Subclinical hyperthyroidism     Past Surgical History:  Procedure Laterality Date  . Arm surgery     fracture, right arm  . CARDIOVERSION N/A 08/10/2014   Procedure: CARDIOVERSION;  Surgeon: Dorothy Spark, MD;  Location: [facility];  Service: Cardiovascular;  Laterality: N/A;  . CATARACT EXTRACTION    . TUBAL LIGATION      Family History  Problem Relation Age of Onset  . Heart attack Mother 59       pacemaker age 39  . Hypothyroidism Mother   . Cancer Father        colon and prostate  . Diabetes Brother   . CAD Brother 42  . Cancer Brother        Renal  . Hypothyroidism Sister     Social History:  reports that she has been smoking cigarettes. She has never used smokeless tobacco. She reports current alcohol use. She reports that she does not use drugs.  Allergies:  Allergies  Allergen Reactions  . Clindamycin/Lincomycin Rash  . Latex Rash    Latex gloves     Review of Systems  She has chronic atrial fibrillation followed by cardiologist  She is on metoprolol 200 mg daily for heart rate control   Examination:   There were no vitals taken for this visit.  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 Any transcriptional errors that result from this process are unintentional.

## 2019-01-10 ENCOUNTER — Other Ambulatory Visit: Payer: Self-pay | Admitting: Cardiology

## 2019-02-21 ENCOUNTER — Other Ambulatory Visit: Payer: Self-pay

## 2019-02-21 ENCOUNTER — Other Ambulatory Visit (INDEPENDENT_AMBULATORY_CARE_PROVIDER_SITE_OTHER): Payer: Medicare Other

## 2019-02-21 DIAGNOSIS — E0520 Thyrotoxicosis with toxic multinodular goiter without thyrotoxic crisis or storm: Secondary | ICD-10-CM

## 2019-02-21 LAB — T4, FREE: Free T4: 0.87 ng/dL (ref 0.60–1.60)

## 2019-02-21 LAB — TSH: TSH: 0.67 u[IU]/mL (ref 0.35–4.50)

## 2019-02-21 LAB — T3, FREE: T3, Free: 3.6 pg/mL (ref 2.3–4.2)

## 2019-02-23 ENCOUNTER — Ambulatory Visit: Payer: Medicare Other | Admitting: Endocrinology

## 2019-02-24 ENCOUNTER — Telehealth: Payer: Self-pay | Admitting: Endocrinology

## 2019-02-24 NOTE — Telephone Encounter (Signed)
LMTCB. Pt was called x 2 previously as her original appts were on the days of the 12th and 14th and per provider request we closed those afternoons

## 2019-02-24 NOTE — Telephone Encounter (Signed)
-----   Message from Elayne Snare, MD sent at 02/22/2019 11:57 AM EDT ----- Please reschedule appointment

## 2019-02-24 NOTE — Telephone Encounter (Signed)
Rhonda Marshall tired contacting patient on 02/14/19,02/15/19 to get patient rescheduled due to the patient   ATC patient again today and LMTCB

## 2019-02-25 ENCOUNTER — Ambulatory Visit: Payer: Medicare Other | Admitting: Endocrinology

## 2019-03-03 ENCOUNTER — Other Ambulatory Visit: Payer: Self-pay

## 2019-03-03 ENCOUNTER — Ambulatory Visit (INDEPENDENT_AMBULATORY_CARE_PROVIDER_SITE_OTHER): Payer: Medicare Other | Admitting: Endocrinology

## 2019-03-03 ENCOUNTER — Encounter: Payer: Self-pay | Admitting: Endocrinology

## 2019-03-03 DIAGNOSIS — E0520 Thyrotoxicosis with toxic multinodular goiter without thyrotoxic crisis or storm: Secondary | ICD-10-CM | POA: Diagnosis not present

## 2019-03-03 NOTE — Progress Notes (Signed)
patient ID: Rhonda Marshall, female   DOB: 05-10-1951, 68 y.o.   MRN: [id number]                                                                                                                Reason for Appointment:  Hyperthyroidism, follow-up  Today's office visit was provided via telemedicine using a telephone call to the patient Patient has been explained the limitations of evaluation and management by telemedicine and the availability of in person appointments.  The patient understood the limitations and agreed to proceed. Patient also understood that the telehealth visit is billable. . Location of the patient: Home . Location of the provider: Office Only the patient and myself were participating in the encounter   Chief complaint: Follow-up for overactive thyroid   History of Present Illness:   Review of her records for thyroid History indicates that she reportedly had subclinical hyperthyroidism in 2012 when she was admitted for new onset atrial fibrillation Subsequently did not have any treatment or further evaluation She has been treated for atrial fibrillation by her cardiologist and was recommended evaluation for the thyroid recently  The patient is a poor historian and on her initial visit she did not give any symptoms of palpitations, shakiness, feeling excessively warm and sweaty or weight loss.  She did complain of nervousness and fatigue.   However she has also had issues with anxiety and depression  Since her baseline TSH was very low along with upper normal free T3 she has been on PTU 50 mg twice a day since October 2017 Baseline thyrotropin receptor antibody was negative With starting treatment she had less fatigue symptoms  She was scheduled for I-131 treatment because of the finding of a large hot nodule on the left side  However she could not do this since she was not able to be in isolation for a couple of days at home   RECENT history:   In 03/2017 when  she was off her antithyroid drugs she was not having any symptoms like palpitations, however she was having some fatigue and shortness of breath and was having more problems with her tachycardia Her free T3 was up above normal at 4.3 at the same time  She has been treated with PTU 50 mg, the dose ranging from 1 up to 2 a day  On her visit in 2/20 she was having relatively higher heart rate and her TSH was low normal at 0.3 She was told to increase her PTU to twice a day but on follow-up she was taking the tablet only once a day on her own  When she came back for follow-up 4 months later her thyroid levels are back to normal and she has been continued on PTU 50 mg daily only She has not complained of any palpitations, new shortness of breath or fatigue She thinks she is trying to lose weight and has gone down about 4 or 5 pounds since 2 months ago No shakiness, change in appetite or weakness  Her  thyroid levels are about the same now normal including TSH at 0.67 Minimal change in her T4 and T3 levels with T3 upper normal   Wt Readings from Last 3 Encounters:  09/09/18 139 lb 6.4 oz (63.2 kg)  06/22/18 141 lb 12.8 oz (64.3 kg)  04/13/18 139 lb (63 kg)      Thyroid function tests as follows:     Lab Results  Component Value Date   FREET4 0.87 02/21/2019   FREET4 0.79 12/20/2018   FREET4 0.68 09/07/2018   T3FREE 3.6 02/21/2019   T3FREE 4.2 06/17/2018   T3FREE 4.0 04/09/2018   TSH 0.67 02/21/2019   TSH 0.54 12/20/2018   TSH 0.30 (L) 09/07/2018    Lab Results  Component Value Date   THYROTRECAB <0.50 12/24/2016     Allergies as of 03/03/2019      Reactions   Clindamycin/lincomycin Rash   Latex Rash   Latex gloves      Medication List       Accurate as of March 03, 2019 10:31 AM. If you have any questions, ask your nurse or doctor.        ALPRAZolam 1 MG tablet Commonly known as: XANAX Take 1 mg by mouth 3 (three) times daily as needed for anxiety.   diltiazem  120 MG 24 hr capsule Commonly known as: CARDIZEM CD Take 1 capsule (120 mg total) by mouth daily.   Eliquis 5 MG Tabs tablet Generic drug: apixaban Take 1 tablet (5 mg total) by mouth 2 (two) times daily.   metoprolol 200 MG 24 hr tablet Commonly known as: TOPROL-XL Take 1 tablet (200 mg total) by mouth 2 (two) times daily.   oxyCODONE-acetaminophen 5-325 MG tablet Commonly known as: PERCOCET/ROXICET Take 1 tablet by mouth every 4 (four) hours as needed for severe pain.   propylthiouracil 50 MG tablet Commonly known as: PTU Take 1 tablet (50 mg total) by mouth daily. TAKE 1 TABLET BY MOUTH ONCE DAILY.           Past Medical History:  Diagnosis Date  . Anxiety   . Atrial fibrillation ([place])    newly diagnosed 10/2010  . Family history of adverse reaction to anesthesia    Sister's BP drops   . Hypertension   . Rheumatoid arthritis(714.0)   . Subclinical hyperthyroidism     Past Surgical History:  Procedure Laterality Date  . Arm surgery     fracture, right arm  . CARDIOVERSION N/A 08/10/2014   Procedure: CARDIOVERSION;  Surgeon: Dorothy Spark, MD;  Location: [place];  Service: Cardiovascular;  Laterality: N/A;  . CATARACT EXTRACTION    . TUBAL LIGATION      Family History  Problem Relation Age of Onset  . Heart attack Mother 89       pacemaker age 31  . Hypothyroidism Mother   . Cancer Father        colon and prostate  . Diabetes Brother   . CAD Brother 14  . Cancer Brother        Renal  . Hypothyroidism Sister     Social History:  reports that she has been smoking cigarettes. She has never used smokeless tobacco. She reports current alcohol use. She reports that she does not use drugs.  Allergies:  Allergies  Allergen Reactions  . Clindamycin/Lincomycin Rash  . Latex Rash    Latex gloves     Review of Systems  She has chronic atrial fibrillation followed by cardiologist  She is  on metoprolol 200 mg daily for heart rate control    Examination:   There were no vitals taken for this visit.     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 She continues to be taking 50 mg PTU once a day at lunchtime She does have a little weight loss but she thinks this is from her cutting back voluntarily on her intake Subjectively doing fairly well  Although her free T3 is upper normal her TSH is not below normal now  Since she is asymptomatic doubt if she is having a recurrence of her hyperthyroidism  She will continue PTU 50 mg daily Reminded her to call if she is having increased palpitations, weakness or fatigue  Does need regular follow-up and will have her come back in 3 months  Haeleigh Streiff 03/03/2019, 10:31 AM   Total duration of telephone encounter =5 minutes  Note: This office note was prepared with Dragon voice recognition system technology. Any transcriptional errors that result from this process are unintentional.

## 2019-03-08 ENCOUNTER — Other Ambulatory Visit: Payer: Self-pay | Admitting: Cardiology

## 2019-03-08 NOTE — Telephone Encounter (Signed)
64f Scr 1.35 06/17/18  63.2kg lov hochrein 09/11/17

## 2019-03-14 ENCOUNTER — Other Ambulatory Visit: Payer: Self-pay | Admitting: Cardiology

## 2019-03-31 ENCOUNTER — Other Ambulatory Visit: Payer: Self-pay | Admitting: Cardiology

## 2019-03-31 NOTE — Telephone Encounter (Signed)
75f 63.2kg Scr 1.35 06/18/19 Lovw/hochrein 09/11/17

## 2019-05-05 DIAGNOSIS — M7989 Other specified soft tissue disorders: Secondary | ICD-10-CM | POA: Insufficient documentation

## 2019-05-05 NOTE — Progress Notes (Signed)
HPI The patient presents for evaluation of atrial fibrillation. She had DCCV in the past but she went back into atrial fib.  We have managed her with rate control and anticoagulation.   Since I last saw her she has been okay.  She denies any significant tachypalpitations unless she becomes significantly stressed.  She does not notice any new shortness of breath.  She has no PND or orthopnea.  She has no chest pressure, neck or arm discomfort.  She does some walking the dogs.  She has stress with her kids and grandkids.    Current Outpatient Medications  Medication Sig Dispense Refill  . ALPRAZolam (XANAX) 1 MG tablet Take 1 mg by mouth 3 (three) times daily as needed for anxiety.   0  . apixaban (ELIQUIS) 5 MG TABS tablet Take 1 tablet (5 mg total) by mouth 2 (two) times daily. Appointment needed with cardiologist for further refills 180 tablet 0  . diltiazem (CARDIZEM CD) 120 MG 24 hr capsule Take 1 capsule (120 mg total) by mouth daily. OFFICE VISIT NEEDED 45 capsule 0  . metoprolol (TOPROL-XL) 200 MG 24 hr tablet Take 1 tablet (200 mg total) by mouth 2 (two) times daily. 60 tablet 0  . oxyCODONE-acetaminophen (PERCOCET/ROXICET) 5-325 MG per tablet Take 1 tablet by mouth every 4 (four) hours as needed for severe pain.     Marland Kitchen propylthiouracil (PTU) 50 MG tablet Take 1 tablet (50 mg total) by mouth daily. TAKE 1 TABLET BY MOUTH ONCE DAILY. 60 tablet 3   No current facility-administered medications for this visit.   Dictated she has  Past Medical History:  Diagnosis Date  . Anxiety   . Atrial fibrillation ([place])    newly diagnosed 10/2010  . Family history of adverse reaction to anesthesia    Sister's BP drops   . Hypertension   . Rheumatoid arthritis(714.0)   . Subclinical hyperthyroidism     Past Surgical History:  Procedure Laterality Date  . Arm surgery     fracture, right arm  . CARDIOVERSION N/A 08/10/2014   Procedure: CARDIOVERSION;  Surgeon: Dorothy Spark, MD;   Location: [facility];  Service: Cardiovascular;  Laterality: N/A;  . CATARACT EXTRACTION    . TUBAL LIGATION      ROS:   As stated in the HPI and negative for all other systems.  PHYSICAL EXAM BP 128/70   Pulse 83   Ht 5\' 1"  (1.549 m)   Wt 135 lb 6.4 oz (61.4 kg)   BMI 25.58 kg/m   GENERAL:  Well appearing NECK:  No jugular venous distention, waveform within normal limits, carotid upstroke brisk and symmetric, no bruits, no thyromegaly LUNGS:  Clear to auscultation bilaterally CHEST:  Unremarkable HEART:  PMI not displaced or sustained,S1 and S2 within normal limits, no S3,  no clicks, no rubs, no murmurs, irregular  ABD:  Flat, positive bowel sounds normal in frequency in pitch, no bruits, no rebound, no guarding, no midline pulsatile mass, no hepatomegaly, no splenomegaly EXT:  2 plus pulses throughout, no edema, no cyanosis no clubbing   EKG:    Atrial fibrillation, rate 83, axis within normal limits, interventricular conduction delay, borderline left bundle branch block   ASSESSMENT AND PLAN  ATRIAL FIB:     [name] has a CHA2DS2 - VASc score of 2.  She tolerates anticoagulation with rate control.  No change in therapy.   TOBACCO ABUSE:   She still smoking a few cigarettes and  we talked about this.  HYPERTHYROID:    She has been followed by Dr. Dwyane Dee and her thyroid most recently was normal in August.   CARDIOMYOPATHY:   She had a mildly reduced ejection fraction on I will follow up with an echocardiogram.   EDEMA:   She has no edema which was a problem previously.  No change in therapy.

## 2019-05-06 ENCOUNTER — Ambulatory Visit (INDEPENDENT_AMBULATORY_CARE_PROVIDER_SITE_OTHER): Payer: Medicare Other | Admitting: Cardiology

## 2019-05-06 ENCOUNTER — Encounter: Payer: Self-pay | Admitting: Cardiology

## 2019-05-06 ENCOUNTER — Other Ambulatory Visit: Payer: Self-pay

## 2019-05-06 VITALS — BP 128/70 | HR 83 | Ht 61.0 in | Wt 135.4 lb

## 2019-05-06 DIAGNOSIS — I4821 Permanent atrial fibrillation: Secondary | ICD-10-CM | POA: Diagnosis not present

## 2019-05-06 DIAGNOSIS — I429 Cardiomyopathy, unspecified: Secondary | ICD-10-CM

## 2019-05-06 DIAGNOSIS — Z720 Tobacco use: Secondary | ICD-10-CM

## 2019-05-06 DIAGNOSIS — M7989 Other specified soft tissue disorders: Secondary | ICD-10-CM | POA: Diagnosis not present

## 2019-05-06 DIAGNOSIS — I5020 Unspecified systolic (congestive) heart failure: Secondary | ICD-10-CM

## 2019-05-06 NOTE — Patient Instructions (Signed)
Medication Instructions:  Your physician recommends that you continue on your current medications as directed. Please refer to the Current Medication list given to you today.  If you need a refill on your cardiac medications before your next appointment, please call your pharmacy.   Lab work: CBC If you have labs (blood work) drawn today and your tests are completely normal, you will receive your results only by: [place] (if you have MyChart) OR A paper copy in the mail If you have any lab test that is abnormal or we need to change your treatment, we will call you to review the results.  Testing/Procedures: Your physician has requested that you have an echocardiogram. Echocardiography is a painless test that uses sound waves to create images of your heart. It provides your doctor with information about the size and shape of your heart and how well your heart's chambers and valves are working. This procedure takes approximately one hour. There are no restrictions for this procedure. [place] [address]  Follow-Up: At [facility], you and your health needs are our priority.  As part of our continuing mission to provide you with exceptional heart care, we have created designated Provider Care Teams.  These Care Teams include your primary Cardiologist (physician) and Advanced Practice Providers (APPs -  Physician Assistants and Nurse Practitioners) who all work together to provide you with the care you need, when you need it. You may see Dr Percival Spanish or one of the following Advanced Practice Providers on your designated Care Team:    Rosaria Ferries, PA-C  Jory Sims, DNP, ANP  Cadence Kathlen Mody, NP Your physician wants you to follow-up in: 1 year. You will receive a reminder letter in the mail two months in advance. If you don't receive a letter, please call our office to schedule the follow-up appointment.

## 2019-05-07 LAB — CBC
Hematocrit: 41.4 % (ref 34.0–46.6)
Hemoglobin: 14.6 g/dL (ref 11.1–15.9)
MCH: 33.6 pg — ABNORMAL HIGH (ref 26.6–33.0)
MCHC: 35.3 g/dL (ref 31.5–35.7)
MCV: 95 fL (ref 79–97)
Platelets: 301 10*3/uL (ref 150–450)
RBC: 4.34 x10E6/uL (ref 3.77–5.28)
RDW: 12.6 % (ref 11.7–15.4)
WBC: 12.8 10*3/uL — ABNORMAL HIGH (ref 3.4–10.8)

## 2019-05-16 ENCOUNTER — Ambulatory Visit (HOSPITAL_COMMUNITY): Payer: Medicare Other | Attending: Internal Medicine

## 2019-05-16 ENCOUNTER — Other Ambulatory Visit: Payer: Self-pay

## 2019-05-16 DIAGNOSIS — I5020 Unspecified systolic (congestive) heart failure: Secondary | ICD-10-CM

## 2019-05-19 ENCOUNTER — Telehealth: Payer: Self-pay

## 2019-05-19 ENCOUNTER — Telehealth: Payer: Self-pay | Admitting: Cardiology

## 2019-05-19 NOTE — Telephone Encounter (Signed)
Follow Up:    Reurining your call, concerning her Echo results.

## 2019-05-19 NOTE — Telephone Encounter (Signed)
LVM with echo results per DPR.

## 2019-05-19 NOTE — Telephone Encounter (Signed)
Gave pt echo results. Verbalized understanding.

## 2019-05-30 ENCOUNTER — Other Ambulatory Visit: Payer: Medicare Other

## 2019-05-31 ENCOUNTER — Other Ambulatory Visit: Payer: Medicare Other

## 2019-06-02 ENCOUNTER — Ambulatory Visit: Payer: Medicare Other | Admitting: Endocrinology

## 2019-06-22 ENCOUNTER — Other Ambulatory Visit: Payer: Self-pay | Admitting: Cardiology

## 2019-06-29 ENCOUNTER — Other Ambulatory Visit: Payer: Self-pay | Admitting: Cardiology

## 2019-06-29 ENCOUNTER — Other Ambulatory Visit: Payer: Self-pay | Admitting: Endocrinology

## 2019-07-05 ENCOUNTER — Other Ambulatory Visit (INDEPENDENT_AMBULATORY_CARE_PROVIDER_SITE_OTHER): Payer: Medicare Other

## 2019-07-05 ENCOUNTER — Other Ambulatory Visit: Payer: Self-pay

## 2019-07-05 DIAGNOSIS — E0520 Thyrotoxicosis with toxic multinodular goiter without thyrotoxic crisis or storm: Secondary | ICD-10-CM

## 2019-07-05 LAB — ALT: ALT: 13 U/L (ref 0–35)

## 2019-07-05 LAB — TSH: TSH: 0.44 u[IU]/mL (ref 0.35–4.50)

## 2019-07-05 LAB — T3, FREE: T3, Free: 4.1 pg/mL (ref 2.3–4.2)

## 2019-07-05 LAB — T4, FREE: Free T4: 0.84 ng/dL (ref 0.60–1.60)

## 2019-07-06 ENCOUNTER — Other Ambulatory Visit: Payer: Medicare Other

## 2019-07-12 ENCOUNTER — Encounter: Payer: Self-pay | Admitting: Endocrinology

## 2019-07-12 ENCOUNTER — Ambulatory Visit (INDEPENDENT_AMBULATORY_CARE_PROVIDER_SITE_OTHER): Payer: Medicare Other | Admitting: Endocrinology

## 2019-07-12 ENCOUNTER — Other Ambulatory Visit: Payer: Self-pay

## 2019-07-12 DIAGNOSIS — E041 Nontoxic single thyroid nodule: Secondary | ICD-10-CM | POA: Diagnosis not present

## 2019-07-12 NOTE — Progress Notes (Signed)
patient ID: Rhonda Marshall, female   DOB: February 27, 1951, 68 y.o.   MRN: [id number]                                                                                                                Reason for Appointment:  Hyperthyroidism, follow-up  Today's office visit was provided via telemedicine using a telephone call to the patient Patient has been explained the limitations of evaluation and management by telemedicine and the availability of in person appointments.  The patient understood the limitations and agreed to proceed. Patient also understood that the telehealth visit is billable. . Location of the patient: Home . Location of the provider: Office Only the patient and myself were participating in the encounter   Chief complaint: Follow-up for overactive thyroid   History of Present Illness:   Review of her records for thyroid History indicates that she reportedly had subclinical hyperthyroidism in 2012 when she was admitted for new onset atrial fibrillation Subsequently did not have any treatment or further evaluation She has been treated for atrial fibrillation by her cardiologist and was recommended evaluation for the thyroid recently  The patient is a poor historian and on her initial visit she did not give any symptoms of palpitations, shakiness, feeling excessively warm and sweaty or weight loss.  She did complain of nervousness and fatigue.   However she has also had issues with anxiety and depression  Since her baseline TSH was very low along with upper normal free T3 she has been on PTU 50 mg twice a day since October 2017 Baseline thyrotropin receptor antibody was negative With starting treatment she had less fatigue symptoms  She was scheduled for I-131 treatment because of the finding of a large hot nodule on the left side  However she could not do this since she was not able to be in isolation for a couple of days at home   RECENT history:   In 03/2017 when  she was off her antithyroid drugs she was not having any symptoms like palpitations, however she was having some fatigue and shortness of breath and was having more problems with her tachycardia Her free T3 was up above normal at 4.3 at the same time  She has been treated with PTU 50 mg, the dose ranging from 1 up to 2 a day  On her visit in 2/20 she was having relatively higher heart rate and her TSH was low normal at 0.3  She was told to increase her PTU to twice a day but she forgot to do so She is coming back after an interval of 4 months now  Subsequently her thyroid levels have been still fairly normal including TSH levels She continues to take PTU only once a day Does not think she has had any recurrence of palpitations, shortness of breath or significant weight change Her weight recently was 134 pounds Also has fairly good appetite  Her thyroid levels are stable with TSH 0.44  Has normal T4 and T3 levels with T3  upper normal   Wt Readings from Last 3 Encounters:  05/06/19 135 lb 6.4 oz (61.4 kg)  09/09/18 139 lb 6.4 oz (63.2 kg)  06/22/18 141 lb 12.8 oz (64.3 kg)      Thyroid function tests as follows:     Lab Results  Component Value Date   FREET4 0.84 07/05/2019   FREET4 0.87 02/21/2019   FREET4 0.79 12/20/2018   T3FREE 4.1 07/05/2019   T3FREE 3.6 02/21/2019   T3FREE 4.2 06/17/2018   TSH 0.44 07/05/2019   TSH 0.67 02/21/2019   TSH 0.54 12/20/2018    Lab Results  Component Value Date   THYROTRECAB <0.50 12/24/2016     Allergies as of 07/12/2019      Reactions   Clindamycin/lincomycin Rash   Latex Rash   Latex gloves      Medication List       Accurate as of July 12, 2019  3:31 PM. If you have any questions, ask your nurse or doctor.        ALPRAZolam 1 MG tablet Commonly known as: XANAX Take 1 mg by mouth 3 (three) times daily as needed for anxiety.   diltiazem 120 MG 24 hr capsule Commonly known as: CARDIZEM CD Take 1 capsule (120 mg  total) by mouth daily. OFFICE VISIT NEEDED   Eliquis 5 MG Tabs tablet Generic drug: apixaban Take 1 tablet (5 mg total) by mouth 2 (two) times daily. Appointment needed with cardiologist for further refills   metoprolol 200 MG 24 hr tablet Commonly known as: TOPROL-XL Take 1 tablet (200 mg total) by mouth 2 (two) times daily.   oxyCODONE-acetaminophen 5-325 MG tablet Commonly known as: PERCOCET/ROXICET Take 1 tablet by mouth every 4 (four) hours as needed for severe pain.   propylthiouracil 50 MG tablet Commonly known as: PTU Take 1 tablet (50 mg total) by mouth daily. TAKE 1 TABLET BY MOUTH ONCE DAILY.           Past Medical History:  Diagnosis Date  . Anxiety   . Atrial fibrillation ([place])    newly diagnosed 10/2010  . Family history of adverse reaction to anesthesia    Sister's BP drops   . Hypertension   . Rheumatoid arthritis(714.0)   . Subclinical hyperthyroidism     Past Surgical History:  Procedure Laterality Date  . Arm surgery     fracture, right arm  . CARDIOVERSION N/A 08/10/2014   Procedure: CARDIOVERSION;  Surgeon: Dorothy Spark, MD;  Location: [facility];  Service: Cardiovascular;  Laterality: N/A;  . CATARACT EXTRACTION    . TUBAL LIGATION      Family History  Problem Relation Age of Onset  . Heart attack Mother 82       pacemaker age 74  . Hypothyroidism Mother   . Cancer Father        colon and prostate  . Diabetes Brother   . CAD Brother 32  . Cancer Brother        Renal  . Hypothyroidism Sister     Social History:  reports that she has been smoking cigarettes. She has never used smokeless tobacco. She reports current alcohol use. She reports that she does not use drugs.  Allergies:  Allergies  Allergen Reactions  . Clindamycin/Lincomycin Rash  . Latex Rash    Latex gloves     Review of Systems  She has chronic atrial fibrillation followed by cardiologist  She is on metoprolol 200 mg daily for heart rate control as  before   Examination:   There were no vitals taken for this visit.     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 She continues to be taking 50 mg PTU once a day for several months now As before she has been reluctant to do I-131 treatment for various reasons  She is asymptomatic Although her T3 level is upper normal her TSH is not suppressed at 0.44  She will continue PTU 50 mg daily once a day She will call if she is having new onset of palpitations, weakness or fatigue  Advised her more regular follow-up and will have her come back in 3 months  Saturnino Liew 07/12/2019, 3:31 PM   Total duration of telephone encounter =5 minutes  Note: This office note was prepared with Dragon voice recognition system technology. Any transcriptional errors that result from this process are unintentional.

## 2019-07-25 ENCOUNTER — Telehealth: Payer: Self-pay | Admitting: Cardiology

## 2019-07-25 NOTE — Telephone Encounter (Signed)
Patient's sister, Rhonda Marshall states that patient has a loose tooth and needs to have tooth extracted. Patient's sister is inquiring if patient should continue to take blood thinner as usual.   Rhonda Marshall states patient is sick and she is speaking on patient's behalf. Please call. [phone]

## 2019-07-25 NOTE — Telephone Encounter (Signed)
Can we get more information from patient's dentist? Is it just one tooth that needs to be removed? Typically, if it is just one tooth that is needed to be removed, we don't need to stop blood thinners but I would like clarification from dentist about exactly what is being done.  Thank you!

## 2019-07-25 NOTE — Telephone Encounter (Signed)
Spoke with pt's sister due to pt being sick. Pt sister states that the pt hasn't even talked to her dentist yet. Pt just wanted to know for herself because her tooth broke and needs to be pulled out. Pt sister states that pt has stopped her blood thinner because she thought that she could get in to see her dentist soon. I made pt sister aware that she needed to continue her blood thinner unless otherwise told so by your provider and see your dentist first. Pt's sister verbalized understanding and thanked me for the call.

## 2019-07-26 ENCOUNTER — Other Ambulatory Visit: Payer: Self-pay | Admitting: Cardiology

## 2019-07-26 NOTE — Telephone Encounter (Signed)
Patient has not seen her dentist yet. Therefore, will remove from pre-op pool and wait for a request form if tooth extraction is needed.

## 2019-07-28 ENCOUNTER — Telehealth: Payer: Self-pay | Admitting: Cardiology

## 2019-07-28 NOTE — Telephone Encounter (Signed)
New Message     1. What dental office are you calling from? [facility]   2. What is your office phone number? [phone]  3. What is your fax number? [phone]  4. What type of procedure is the patient having performed? Tooth Extraction   5. What date is procedure scheduled or is the patient there now? 07/28/2019 (if the patient is at the dentist's office question goes to their cardiologist if he/she is in the office.  If not, question should go to the DOD).   6. What is your question (ex. Antibiotics prior to procedure, holding medication-we need to know how long dentist wants pt to hold med)? Dentist is wondering if the pt has any requirements before the extraction

## 2019-07-28 NOTE — Telephone Encounter (Signed)
Spoke with Dr. Bary Leriche office. Dr. Percival Spanish reports he is okay with the extraction, it would be up to the dentist's policy on whether to pull the tooth with the patient being on Eliquis. If they want to hold the Eliquis for 2 days prior to extraction he is okay with that, the dentist would be the one to restart Eliquis when he thinks it is safe. Understanding verbalized.

## 2019-08-23 ENCOUNTER — Ambulatory Visit (INDEPENDENT_AMBULATORY_CARE_PROVIDER_SITE_OTHER): Payer: Medicare Other | Admitting: Otolaryngology

## 2019-08-23 ENCOUNTER — Other Ambulatory Visit: Payer: Self-pay

## 2019-08-23 ENCOUNTER — Encounter (INDEPENDENT_AMBULATORY_CARE_PROVIDER_SITE_OTHER): Payer: Self-pay | Admitting: Otolaryngology

## 2019-08-23 VITALS — Temp 97.7°F

## 2019-08-23 DIAGNOSIS — J383 Other diseases of vocal cords: Secondary | ICD-10-CM | POA: Diagnosis not present

## 2019-08-23 DIAGNOSIS — R490 Dysphonia: Secondary | ICD-10-CM | POA: Diagnosis not present

## 2019-08-23 NOTE — Progress Notes (Signed)
HPI: Rhonda Marshall is a 69 y.o. female who presents is referred by her PCP for evaluation of chronic hoarseness.  She presents today with her sister.  She started having hoarseness 1 January and it has progressed over the past 6 weeks.  She has been on rounds of antibiotics for laryngitis and thought this made it better.  She denies any sore throat. She does smoke about 1/3 to 1/4 pack/day.. She has a history of cardiac problems with arrhythmia which she takes medication for.  She is also on Eliquis and has been on this for years.  Past Medical History:  Diagnosis Date  . Anxiety   . Atrial fibrillation ([place])    newly diagnosed 10/2010  . Family history of adverse reaction to anesthesia    Sister's BP drops   . Hypertension   . Rheumatoid arthritis(714.0)   . Subclinical hyperthyroidism    Past Surgical History:  Procedure Laterality Date  . Arm surgery     fracture, right arm  . CARDIOVERSION N/A 08/10/2014   Procedure: CARDIOVERSION;  Surgeon: Dorothy Spark, MD;  Location: [facility];  Service: Cardiovascular;  Laterality: N/A;  . CATARACT EXTRACTION    . TUBAL LIGATION     Social History   Socioeconomic History  . Marital status: Widowed    Spouse name: Not on file  . Number of children: 1  . Years of education: Not on file  . Highest education level: Not on file  Occupational History  . Occupation: laborer    Comment: Warehouse manager  Tobacco Use  . Smoking status: Current Every Day Smoker    Packs/day: 0.33    Years: 54.00    Pack years: 17.82    Types: Cigarettes    Start date: 7  . Smokeless tobacco: Never Used  . Tobacco comment: 4  Substance and Sexual Activity  . Alcohol use: Yes    Alcohol/week: 0.0 standard drinks    Comment: Rarely  . Drug use: No  . Sexual activity: Not on file  Other Topics Concern  . Not on file  Social History Narrative  . Not on file   Social Determinants of Health   Financial Resource Strain:   . Difficulty of  Paying Living Expenses: Not on file  Food Insecurity:   . Worried About Charity fundraiser in the Last Year: Not on file  . Ran Out of Food in the Last Year: Not on file  Transportation Needs:   . Lack of Transportation (Medical): Not on file  . Lack of Transportation (Non-Medical): Not on file  Physical Activity:   . Days of Exercise per Week: Not on file  . Minutes of Exercise per Session: Not on file  Stress:   . Feeling of Stress : Not on file  Social Connections:   . Frequency of Communication with Friends and Family: Not on file  . Frequency of Social Gatherings with Friends and Family: Not on file  . Attends Religious Services: Not on file  . Active Member of Clubs or Organizations: Not on file  . Attends Archivist Meetings: Not on file  . Marital Status: Not on file   Family History  Problem Relation Age of Onset  . Heart attack Mother 62       pacemaker age 39  . Hypothyroidism Mother   . Cancer Father        colon and prostate  . Diabetes Brother   . CAD Brother 32  .  Cancer Brother        Renal  . Hypothyroidism Sister    Allergies  Allergen Reactions  . Clindamycin/Lincomycin Rash  . Latex Rash    Latex gloves   Prior to Admission medications   Medication Sig Start Date End Date Taking? Authorizing Provider  ALPRAZolam Duanne Moron) 1 MG tablet Take 1 mg by mouth 3 (three) times daily as needed for anxiety.  05/11/14  Yes [provider]  diltiazem (CARDIZEM CD) 120 MG 24 hr capsule Take 1 capsule (120 mg total) by mouth daily. OFFICE VISIT NEEDED 06/22/19 09/20/19 Yes Minus Breeding, MD  ELIQUIS 5 MG TABS tablet Take 1 tablet (5 mg total) by mouth 2 (two) times daily. Appointment needed with cardiologist for further refills 06/29/19  Yes Minus Breeding, MD  metoprolol (TOPROL-XL) 200 MG 24 hr tablet Take 1 tablet (200 mg total) by mouth 2 (two) times daily. 07/26/19  Yes Minus Breeding, MD  oxyCODONE-acetaminophen (PERCOCET/ROXICET) 5-325 MG per  tablet Take 1 tablet by mouth every 4 (four) hours as needed for severe pain.    Yes [provider]  propylthiouracil (PTU) 50 MG tablet Take 1 tablet (50 mg total) by mouth daily. TAKE 1 TABLET BY MOUTH ONCE DAILY. 06/29/19  Yes Elayne Snare, MD     Positive ROS: Otherwise negative  All other systems have been reviewed and were otherwise negative with the exception of those mentioned in the HPI and as above.  Physical Exam: Constitutional: Alert, well-appearing, no acute distress.  She is moderately hoarse but having no difficulty breathing. Ears: External ears without lesions or tenderness. Ear canals are clear bilaterally with intact, clear TMs.  Nasal: External nose without lesions. Septum relatively midline. Clear nasal passages Oral: Lips and gums without lesions. Tongue and palate mucosa without lesions. Posterior oropharynx clear. Fiberoptic laryngoscopy was performed to the left nostril.  Nasopharynx was clear.  Base of tongue vallecula and epiglottis were normal.  She had a white lesion on the left true vocal cord that extended up toward the anterior commissure.  Vocal cord had normal mobility. Neck: No palpable adenopathy or masses Respiratory: Breathing comfortably  Skin: No facial/neck lesions or rash noted.  Laryngoscopy  Date/Time: 08/23/2019 4:51 PM Performed by: Rozetta Nunnery, MD Authorized by: Rozetta Nunnery, MD   Consent:    Consent obtained:  Verbal   Consent given by:  Patient   Risks discussed:  Pain Procedure details:    Indications: hoarseness, dysphagia, or aspiration     Medication:  Afrin   Instrument: flexible fiberoptic laryngoscope     Scope location: left nare   Mouth:    Base of tongue: normal     Epiglottis: normal   Throat:    Pyriform sinus: normal     Normal: Left true vocal cord leukoplakia.     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  She had normal vocal cord mobility.   It was a little difficult to adequately visualize the leukoplakic lesion but this extends to the anterior commissure.  Both vocal cords had normal mobility..    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  Plan: I discussed with patient and sister today concerning need for direct laryngoscopy and biopsy we will plan on scheduling this for the end of next week. She has history of cardiac disease and will need to get cardiac clearance as well as stopping her  Eliquis for 4 days prior to surgery.   [facility], MD   CC:

## 2019-08-24 ENCOUNTER — Telehealth: Payer: Self-pay | Admitting: Cardiology

## 2019-08-24 NOTE — Telephone Encounter (Signed)
New Message      [facility] Pre-operative Risk Assessment    Request for surgical clearance:  1. What type of surgery is being performed? DIRECT LARYNGOSCOPY WITH BIOPSY   2. When is this surgery scheduled? 09/02/2019  3. What type of clearance is required (medical clearance vs. Pharmacy clearance to hold med vs. Both)? Both  4. Are there any medications that need to be held prior to surgery and how long? Eliquis held for doctor recommendation   5. Practice name and name of physician performing surgery? [facility]    6. What is your office phone number [phone]    7.   What is your office fax number [phone]  8.   Anesthesia type (None, local, MAC, general) ? GGeneral anesthesia for one hour  The also want to know if Dr. Percival Spanish will recommend that the procedure be done at the surgical center versus [facility] 08/24/2019, 10:34 AM  _________________________________________________________________   (provider comments below)

## 2019-08-25 ENCOUNTER — Other Ambulatory Visit: Payer: Self-pay | Admitting: Cardiology

## 2019-08-25 NOTE — Telephone Encounter (Signed)
Patient with diagnosis of afib on Eliquis for anticoagulation.    Procedure: DIRECT LARYNGOSCOPY WITH BIOPSY  Date of procedure: 09/02/2019  CHADS2-VASc score of  4 (CHF, HTN, AGE, female)  CrCl 54 ml/min  Per office protocol, patient can hold Eliquis for 2 days prior to procedure.

## 2019-08-25 NOTE — Telephone Encounter (Signed)
   Primary Cardiologist: No primary care provider on file.  Chart reviewed as part of pre-operative protocol coverage. Given past medical history and time since last visit, based on ACC/AHA guidelines, Rhonda Marshall would be at acceptable risk for the planned procedure without further cardiovascular testing.   Her RCRI is a class II risk, 0.9% risk of major cardiac event.  Patient with diagnosis of afib on Eliquis for anticoagulation.    Procedure: DIRECT LARYNGOSCOPY WITH BIOPSY Date of procedure: 09/02/2019  CHADS2-VASc score of  4 (CHF, HTN, AGE, female)  CrCl 54 ml/min  Per office protocol, patient can hold Eliquis for 2 days prior to procedure.   I will route this recommendation to the requesting party via Epic fax function and remove from pre-op pool.  Please call with questions.  Jossie Ng. [facility] [place] [address] Office [phone] Fax [phone]

## 2019-08-26 ENCOUNTER — Ambulatory Visit (INDEPENDENT_AMBULATORY_CARE_PROVIDER_SITE_OTHER): Payer: Self-pay | Admitting: Otolaryngology

## 2019-08-26 DIAGNOSIS — J383 Other diseases of vocal cords: Secondary | ICD-10-CM

## 2019-08-26 NOTE — H&P (Signed)
PREOPERATIVE H&P  Chief Complaint: Chronic hoarseness  HPI: Rhonda Marshall is a 69 y.o. female who presents for evaluation of chronic hoarseness for the past 6 weeks.  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  Vocal cords had good mobility bilaterally.  She is taken to the operating room at this time for microlaryngoscopy and biopsy of the vocal cord lesion. Patient is on Eliquis which will be stopped 3 to 4 days preop.  Past Medical History:  Diagnosis Date  . Anxiety   . Atrial fibrillation ([place])    newly diagnosed 10/2010  . Family history of adverse reaction to anesthesia    Sister's BP drops   . Hypertension   . Rheumatoid arthritis(714.0)   . Subclinical hyperthyroidism    Past Surgical History:  Procedure Laterality Date  . Arm surgery     fracture, right arm  . CARDIOVERSION N/A 08/10/2014   Procedure: CARDIOVERSION;  Surgeon: Dorothy Spark, MD;  Location: [facility];  Service: Cardiovascular;  Laterality: N/A;  . CATARACT EXTRACTION    . TUBAL LIGATION     Social History   Socioeconomic History  . Marital status: Widowed    Spouse name: Not on file  . Number of children: 1  . Years of education: Not on file  . Highest education level: Not on file  Occupational History  . Occupation: laborer    Comment: Warehouse manager  Tobacco Use  . Smoking status: Current Every Day Smoker    Packs/day: 0.33    Years: 54.00    Pack years: 17.82    Types: Cigarettes    Start date: 37  . Smokeless tobacco: Never Used  . Tobacco comment: 4  Substance and Sexual Activity  . Alcohol use: Yes    Alcohol/week: 0.0 standard drinks    Comment: Rarely  . Drug use: No  . Sexual activity: Not on file  Other Topics Concern  . Not on file  Social History Narrative  . Not on file   Social Determinants of Health   Financial Resource Strain:   . Difficulty of  Paying Living Expenses: Not on file  Food Insecurity:   . Worried About Charity fundraiser in the Last Year: Not on file  . Ran Out of Food in the Last Year: Not on file  Transportation Needs:   . Lack of Transportation (Medical): Not on file  . Lack of Transportation (Non-Medical): Not on file  Physical Activity:   . Days of Exercise per Week: Not on file  . Minutes of Exercise per Session: Not on file  Stress:   . Feeling of Stress : Not on file  Social Connections:   . Frequency of Communication with Friends and Family: Not on file  . Frequency of Social Gatherings with Friends and Family: Not on file  . Attends Religious Services: Not on file  . Active Member of Clubs or Organizations: Not on file  . Attends Archivist Meetings: Not on file  . Marital Status: Not on file   Family History  Problem Relation Age of Onset  . Heart attack Mother 65       pacemaker age 61  . Hypothyroidism Mother   . Cancer Father        colon and prostate  . Diabetes Brother   . CAD Brother 72  . Cancer Brother  Renal  . Hypothyroidism Sister    Allergies  Allergen Reactions  . Clindamycin/Lincomycin Rash  . Latex Rash    Latex gloves   Prior to Admission medications   Medication Sig Start Date End Date Taking? Authorizing Provider  ALPRAZolam Duanne Moron) 1 MG tablet Take 1 mg by mouth 3 (three) times daily as needed for anxiety.  05/11/14   [provider]  diltiazem (CARDIZEM CD) 120 MG 24 hr capsule Take 1 capsule (120 mg total) by mouth daily. OFFICE VISIT NEEDED 06/22/19 09/20/19  Minus Breeding, MD  ELIQUIS 5 MG TABS tablet Take 1 tablet (5 mg total) by mouth 2 (two) times daily. Appointment needed with cardiologist for further refills 06/29/19   Minus Breeding, MD  metoprolol (TOPROL-XL) 200 MG 24 hr tablet Take 1 tablet (200 mg total) by mouth 2 (two) times daily. 08/26/19   Minus Breeding, MD  oxyCODONE-acetaminophen (PERCOCET/ROXICET) 5-325 MG per tablet Take  1 tablet by mouth every 4 (four) hours as needed for severe pain.     [provider]  propylthiouracil (PTU) 50 MG tablet Take 1 tablet (50 mg total) by mouth daily. TAKE 1 TABLET BY MOUTH ONCE DAILY. 06/29/19   Elayne Snare, MD     Positive ROS: Otherwise negative  All other systems have been reviewed and were otherwise negative with the exception of those mentioned in the HPI and as above.  Physical Exam: There were no vitals filed for this visit.  General: Alert, no acute distress Oral: Normal oral mucosa and tonsils Nasal: Clear nasal passages.  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  All cords have good mobility bilaterally. Neck: No palpable adenopathy or thyroid nodules Ear: Ear canal is clear with normal appearing TMs Cardiovascular: Regular rate and rhythm, no murmur.  Respiratory: Clear to auscultation Neurologic: Alert and oriented x 3   Assessment/Plan: Hoarseness with left vocal cord lesion  Plan for microlaryngoscopy and biopsy.   [name], MD 08/26/2019 1:32 PM

## 2019-08-26 NOTE — H&P (View-Only) (Signed)
PREOPERATIVE H&P  Chief Complaint: Chronic hoarseness  HPI: Rhonda Marshall is a 69 y.o. female who presents for evaluation of chronic hoarseness for the past 6 weeks.  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  Vocal cords had good mobility bilaterally.  She is taken to the operating room at this time for microlaryngoscopy and biopsy of the vocal cord lesion. Patient is on Eliquis which will be stopped 3 to 4 days preop.  Past Medical History:  Diagnosis Date  . Anxiety   . Atrial fibrillation ([place])    newly diagnosed 10/2010  . Family history of adverse reaction to anesthesia    Sister's BP drops   . Hypertension   . Rheumatoid arthritis(714.0)   . Subclinical hyperthyroidism    Past Surgical History:  Procedure Laterality Date  . Arm surgery     fracture, right arm  . CARDIOVERSION N/A 08/10/2014   Procedure: CARDIOVERSION;  Surgeon: Dorothy Spark, MD;  Location: [place];  Service: Cardiovascular;  Laterality: N/A;  . CATARACT EXTRACTION    . TUBAL LIGATION     Social History   Socioeconomic History  . Marital status: Widowed    Spouse name: Not on file  . Number of children: 1  . Years of education: Not on file  . Highest education level: Not on file  Occupational History  . Occupation: laborer    Comment: Warehouse manager  Tobacco Use  . Smoking status: Current Every Day Smoker    Packs/day: 0.33    Years: 54.00    Pack years: 17.82    Types: Cigarettes    Start date: 75  . Smokeless tobacco: Never Used  . Tobacco comment: 4  Substance and Sexual Activity  . Alcohol use: Yes    Alcohol/week: 0.0 standard drinks    Comment: Rarely  . Drug use: No  . Sexual activity: Not on file  Other Topics Concern  . Not on file  Social History Narrative  . Not on file   Social Determinants of Health   Financial Resource Strain:   . Difficulty of  Paying Living Expenses: Not on file  Food Insecurity:   . Worried About Charity fundraiser in the Last Year: Not on file  . Ran Out of Food in the Last Year: Not on file  Transportation Needs:   . Lack of Transportation (Medical): Not on file  . Lack of Transportation (Non-Medical): Not on file  Physical Activity:   . Days of Exercise per Week: Not on file  . Minutes of Exercise per Session: Not on file  Stress:   . Feeling of Stress : Not on file  Social Connections:   . Frequency of Communication with Friends and Family: Not on file  . Frequency of Social Gatherings with Friends and Family: Not on file  . Attends Religious Services: Not on file  . Active Member of Clubs or Organizations: Not on file  . Attends Archivist Meetings: Not on file  . Marital Status: Not on file   Family History  Problem Relation Age of Onset  . Heart attack Mother 34       pacemaker age 86  . Hypothyroidism Mother   . Cancer Father        colon and prostate  . Diabetes Brother   . CAD Brother 47  . Cancer Brother  Renal  . Hypothyroidism Sister    Allergies  Allergen Reactions  . Clindamycin/Lincomycin Rash  . Latex Rash    Latex gloves   Prior to Admission medications   Medication Sig Start Date End Date Taking? Authorizing Provider  ALPRAZolam Duanne Moron) 1 MG tablet Take 1 mg by mouth 3 (three) times daily as needed for anxiety.  05/11/14   [provider]  diltiazem (CARDIZEM CD) 120 MG 24 hr capsule Take 1 capsule (120 mg total) by mouth daily. OFFICE VISIT NEEDED 06/22/19 09/20/19  Minus Breeding, MD  ELIQUIS 5 MG TABS tablet Take 1 tablet (5 mg total) by mouth 2 (two) times daily. Appointment needed with cardiologist for further refills 06/29/19   Minus Breeding, MD  metoprolol (TOPROL-XL) 200 MG 24 hr tablet Take 1 tablet (200 mg total) by mouth 2 (two) times daily. 08/26/19   Minus Breeding, MD  oxyCODONE-acetaminophen (PERCOCET/ROXICET) 5-325 MG per tablet Take  1 tablet by mouth every 4 (four) hours as needed for severe pain.     [provider]  propylthiouracil (PTU) 50 MG tablet Take 1 tablet (50 mg total) by mouth daily. TAKE 1 TABLET BY MOUTH ONCE DAILY. 06/29/19   Elayne Snare, MD     Positive ROS: Otherwise negative  All other systems have been reviewed and were otherwise negative with the exception of those mentioned in the HPI and as above.  Physical Exam: There were no vitals filed for this visit.  General: Alert, no acute distress Oral: Normal oral mucosa and tonsils Nasal: Clear nasal passages.  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  All cords have good mobility bilaterally. Neck: No palpable adenopathy or thyroid nodules Ear: Ear canal is clear with normal appearing TMs Cardiovascular: Regular rate and rhythm, no murmur.  Respiratory: Clear to auscultation Neurologic: Alert and oriented x 3   Assessment/Plan: Hoarseness with left vocal cord lesion  Plan for microlaryngoscopy and biopsy.   [name], MD 08/26/2019 1:32 PM

## 2019-08-29 ENCOUNTER — Encounter (HOSPITAL_BASED_OUTPATIENT_CLINIC_OR_DEPARTMENT_OTHER): Payer: Self-pay | Admitting: Otolaryngology

## 2019-08-29 ENCOUNTER — Other Ambulatory Visit: Payer: Self-pay

## 2019-08-30 ENCOUNTER — Other Ambulatory Visit (HOSPITAL_COMMUNITY)
Admission: RE | Admit: 2019-08-30 | Discharge: 2019-08-30 | Disposition: A | Discharge status: Home / Self Care | Payer: Medicare Other | Source: Ambulatory Visit | Attending: Otolaryngology | Admitting: Otolaryngology

## 2019-08-30 DIAGNOSIS — Z01812 Encounter for preprocedural laboratory examination: Secondary | ICD-10-CM | POA: Diagnosis present

## 2019-08-30 DIAGNOSIS — Z20822 Contact with and (suspected) exposure to covid-19: Secondary | ICD-10-CM | POA: Diagnosis not present

## 2019-08-30 LAB — SARS CORONAVIRUS 2 (TAT 6-24 HRS): SARS Coronavirus 2: NEGATIVE

## 2019-08-30 NOTE — Progress Notes (Signed)
Surgical soap given with instructions, pt verbalized understanding.  

## 2019-09-02 ENCOUNTER — Ambulatory Visit (HOSPITAL_BASED_OUTPATIENT_CLINIC_OR_DEPARTMENT_OTHER): Payer: Medicare Other | Admitting: Anesthesiology

## 2019-09-02 ENCOUNTER — Other Ambulatory Visit: Payer: Self-pay

## 2019-09-02 ENCOUNTER — Ambulatory Visit (HOSPITAL_BASED_OUTPATIENT_CLINIC_OR_DEPARTMENT_OTHER)
Admission: RE | Admit: 2019-09-02 | Discharge: 2019-09-02 | Disposition: A | Discharge status: Home / Self Care | Payer: Medicare Other | Attending: Otolaryngology | Admitting: Otolaryngology

## 2019-09-02 ENCOUNTER — Encounter (HOSPITAL_BASED_OUTPATIENT_CLINIC_OR_DEPARTMENT_OTHER)
Admission: RE | Disposition: A | Discharge status: Home / Self Care | Payer: Self-pay | Source: Home / Self Care | Attending: Otolaryngology

## 2019-09-02 ENCOUNTER — Encounter (HOSPITAL_BASED_OUTPATIENT_CLINIC_OR_DEPARTMENT_OTHER): Payer: Self-pay | Admitting: Otolaryngology

## 2019-09-02 DIAGNOSIS — J383 Other diseases of vocal cords: Secondary | ICD-10-CM

## 2019-09-02 DIAGNOSIS — Z7901 Long term (current) use of anticoagulants: Secondary | ICD-10-CM | POA: Insufficient documentation

## 2019-09-02 DIAGNOSIS — I4891 Unspecified atrial fibrillation: Secondary | ICD-10-CM | POA: Insufficient documentation

## 2019-09-02 DIAGNOSIS — M069 Rheumatoid arthritis, unspecified: Secondary | ICD-10-CM | POA: Diagnosis not present

## 2019-09-02 DIAGNOSIS — F419 Anxiety disorder, unspecified: Secondary | ICD-10-CM | POA: Diagnosis not present

## 2019-09-02 DIAGNOSIS — F17210 Nicotine dependence, cigarettes, uncomplicated: Secondary | ICD-10-CM | POA: Insufficient documentation

## 2019-09-02 DIAGNOSIS — Z79899 Other long term (current) drug therapy: Secondary | ICD-10-CM | POA: Insufficient documentation

## 2019-09-02 DIAGNOSIS — I10 Essential (primary) hypertension: Secondary | ICD-10-CM | POA: Insufficient documentation

## 2019-09-02 DIAGNOSIS — C320 Malignant neoplasm of glottis: Secondary | ICD-10-CM

## 2019-09-02 DIAGNOSIS — R490 Dysphonia: Secondary | ICD-10-CM | POA: Diagnosis present

## 2019-09-02 DIAGNOSIS — E0580 Other thyrotoxicosis without thyrotoxic crisis or storm: Secondary | ICD-10-CM | POA: Diagnosis not present

## 2019-09-02 HISTORY — PX: DIRECT LARYNGOSCOPY: SHX5326

## 2019-09-02 SURGERY — LARYNGOSCOPY, DIRECT
Anesthesia: General | Site: Mouth

## 2019-09-02 MED ORDER — LIDOCAINE HCL 1 % IJ SOLN
INTRAMUSCULAR | Status: DC | PRN
  Administered 2019-09-02: 40 mg via INTRADERMAL

## 2019-09-02 MED ORDER — CHLORHEXIDINE GLUCONATE CLOTH 2 % EX PADS: 6.0000 | MEDICATED_PAD | Freq: Once | CUTANEOUS | Status: DC

## 2019-09-02 MED ORDER — PROPOFOL 10 MG/ML IV BOLUS
INTRAVENOUS | Status: AC
  Filled 2019-09-02: qty 20

## 2019-09-02 MED ORDER — PROPOFOL 10 MG/ML IV BOLUS
INTRAVENOUS | Status: DC | PRN
  Administered 2019-09-02: 100 mg via INTRAVENOUS

## 2019-09-02 MED ORDER — SUCCINYLCHOLINE CHLORIDE 20 MG/ML IJ SOLN
INTRAMUSCULAR | Status: DC | PRN
  Administered 2019-09-02: 120 mg via INTRAVENOUS

## 2019-09-02 MED ORDER — ROCURONIUM BROMIDE 100 MG/10ML IV SOLN
INTRAVENOUS | Status: DC | PRN
  Administered 2019-09-02: 3 mg via INTRAVENOUS

## 2019-09-02 MED ORDER — LIDOCAINE 2% (20 MG/ML) 5 ML SYRINGE
INTRAMUSCULAR | Status: AC
  Filled 2019-09-02: qty 5

## 2019-09-02 MED ORDER — SUCCINYLCHOLINE CHLORIDE 200 MG/10ML IV SOSY
PREFILLED_SYRINGE | INTRAVENOUS | Status: AC
  Filled 2019-09-02: qty 10

## 2019-09-02 MED ORDER — FENTANYL CITRATE (PF) 100 MCG/2ML IJ SOLN
INTRAMUSCULAR | Status: AC
  Filled 2019-09-02: qty 2

## 2019-09-02 MED ORDER — ACETAMINOPHEN 500 MG PO TABS
ORAL_TABLET | ORAL | Status: AC
  Filled 2019-09-02: qty 2

## 2019-09-02 MED ORDER — ONDANSETRON HCL 4 MG/2ML IJ SOLN
INTRAMUSCULAR | Status: AC
  Filled 2019-09-02: qty 2

## 2019-09-02 MED ORDER — DEXAMETHASONE SODIUM PHOSPHATE 10 MG/ML IJ SOLN
INTRAMUSCULAR | Status: AC
  Filled 2019-09-02: qty 1

## 2019-09-02 MED ORDER — ACETAMINOPHEN 500 MG PO TABS
1000.0000 mg | ORAL_TABLET | Freq: Once | ORAL | Status: AC
  Administered 2019-09-02: 1000 mg via ORAL

## 2019-09-02 MED ORDER — CEFAZOLIN SODIUM-DEXTROSE 2-4 GM/100ML-% IV SOLN
2.0000 g | INTRAVENOUS | Status: AC
  Administered 2019-09-02: 2 g via INTRAVENOUS

## 2019-09-02 MED ORDER — EPINEPHRINE PF 1 MG/ML IJ SOLN
INTRAMUSCULAR | Status: DC | PRN
  Administered 2019-09-02: 1 mg

## 2019-09-02 MED ORDER — DEXAMETHASONE SODIUM PHOSPHATE 10 MG/ML IJ SOLN
INTRAMUSCULAR | Status: DC | PRN
  Administered 2019-09-02: 10 mg via INTRAVENOUS

## 2019-09-02 MED ORDER — LACTATED RINGERS IV SOLN: INTRAVENOUS | Status: DC

## 2019-09-02 MED ORDER — CEFAZOLIN SODIUM-DEXTROSE 2-4 GM/100ML-% IV SOLN
INTRAVENOUS | Status: AC
  Filled 2019-09-02: qty 100

## 2019-09-02 MED ORDER — ONDANSETRON HCL 4 MG/2ML IJ SOLN
INTRAMUSCULAR | Status: DC | PRN
  Administered 2019-09-02: 4 mg via INTRAVENOUS

## 2019-09-02 MED ORDER — FENTANYL CITRATE (PF) 100 MCG/2ML IJ SOLN
INTRAMUSCULAR | Status: DC | PRN
  Administered 2019-09-02: 50 ug via INTRAVENOUS

## 2019-09-02 MED ORDER — FENTANYL CITRATE (PF) 100 MCG/2ML IJ SOLN: 25.0000 ug | INTRAMUSCULAR | Status: DC | PRN

## 2019-09-02 MED ORDER — PHENYLEPHRINE 40 MCG/ML (10ML) SYRINGE FOR IV PUSH (FOR BLOOD PRESSURE SUPPORT)
PREFILLED_SYRINGE | INTRAVENOUS | Status: DC | PRN
  Administered 2019-09-02: 80 ug via INTRAVENOUS

## 2019-09-02 SURGICAL SUPPLY — 35 items
CANISTER SUCT 1200ML W/VALVE (MISCELLANEOUS) ×3 IMPLANT
CNTNR URN SCR LID CUP LEK RST (MISCELLANEOUS) IMPLANT
CONT SPEC 4OZ STRL OR WHT (MISCELLANEOUS)
COVER WAND RF STERILE (DRAPES) IMPLANT
GAUZE SPONGE 4X4 12PLY STRL LF (GAUZE/BANDAGES/DRESSINGS) ×6 IMPLANT
GLOVE BIOGEL PI IND STRL 7.0 (GLOVE) IMPLANT
GLOVE BIOGEL PI INDICATOR 7.0 (GLOVE) ×2
GLOVE SURG SS PI 7.0 STRL IVOR (GLOVE) ×3 IMPLANT
GLOVE SURG SYN 7.5  E (GLOVE) ×2
GLOVE SURG SYN 7.5 E (GLOVE) ×1 IMPLANT
GLOVE SURG SYN 7.5 PF PI (GLOVE) ×1 IMPLANT
GOWN STRL REUS W/ TWL LRG LVL3 (GOWN DISPOSABLE) IMPLANT
GOWN STRL REUS W/ TWL XL LVL3 (GOWN DISPOSABLE) IMPLANT
GOWN STRL REUS W/TWL LRG LVL3 (GOWN DISPOSABLE)
GOWN STRL REUS W/TWL XL LVL3 (GOWN DISPOSABLE)
GUARD TEETH (MISCELLANEOUS) ×3 IMPLANT
MARKER SKIN DUAL TIP RULER LAB (MISCELLANEOUS) IMPLANT
NDL SAFETY ECLIPSE 18X1.5 (NEEDLE) ×1 IMPLANT
NDL SPNL 22GX7 QUINCKE BK (NEEDLE) IMPLANT
NEEDLE HYPO 18GX1.5 SHARP (NEEDLE) ×3
NEEDLE SPNL 22GX7 QUINCKE BK (NEEDLE) IMPLANT
NS IRRIG 1000ML POUR BTL (IV SOLUTION) ×3 IMPLANT
PACK BASIN DAY SURGERY FS (CUSTOM PROCEDURE TRAY) ×3 IMPLANT
PATTIES SURGICAL .5 X3 (DISPOSABLE) ×3 IMPLANT
SHEET MEDIUM DRAPE 40X70 STRL (DRAPES) ×3 IMPLANT
SLEEVE SCD COMPRESS KNEE MED (MISCELLANEOUS) ×3 IMPLANT
SOL ANTI FOG 6CC (MISCELLANEOUS) IMPLANT
SOLUTION ANTI FOG 6CC (MISCELLANEOUS)
SOLUTION BUTLER CLEAR DIP (MISCELLANEOUS) ×3 IMPLANT
SURGILUBE 2OZ TUBE FLIPTOP (MISCELLANEOUS) IMPLANT
SYR 5ML LL (SYRINGE) ×3 IMPLANT
SYR CONTROL 10ML LL (SYRINGE) IMPLANT
TOWEL GREEN STERILE FF (TOWEL DISPOSABLE) ×3 IMPLANT
TUBE CONNECTING 20'X1/4 (TUBING) ×1
TUBE CONNECTING 20X1/4 (TUBING) ×2 IMPLANT

## 2019-09-02 NOTE — Transfer of Care (Signed)
Immediate Anesthesia Transfer of Care Note  Patient: Rhonda Marshall  Procedure(s) Performed: DIRECT LARYNGOSCOPY WITH BIOPSY (N/A Mouth)  Patient Location: PACU  Anesthesia Type:General  Level of Consciousness: awake, alert  and oriented  Airway & Oxygen Therapy: Patient Spontanous Breathing and Patient connected to face mask oxygen  Post-op Assessment: Report given to RN and Post -op Vital signs reviewed and stable  Post vital signs: Reviewed and stable  Last Vitals:  Vitals Value Taken Time  BP 127/84 09/02/19 1049  Temp    Pulse 94 09/02/19 1050  Resp 13 09/02/19 1050  SpO2 100 % 09/02/19 1050  Vitals shown include unvalidated device data.  Last Pain:  Vitals:   09/02/19 0924  TempSrc: Temporal  PainSc: 0-No pain      Patients Stated Pain Goal: 0 ([id number])  Complications: No apparent anesthesia complications

## 2019-09-02 NOTE — Discharge Instructions (Addendum)
No Tylenol before 3:45pm today.  Take your regular meds.  Restart Eliquis tomorrow Diet as tolerated Tylenol prn pain Can also use throat lozenges for sore throat.   Post Anesthesia Home Care Instructions  Activity: Get plenty of rest for the remainder of the day. A responsible individual must stay with you for 24 hours following the procedure.  For the next 24 hours, DO NOT: -Drive a car -Paediatric nurse -Drink alcoholic beverages -Take any medication unless instructed by your physician -Make any legal decisions or sign important papers.  Meals: Start with liquid foods such as gelatin or soup. Progress to regular foods as tolerated. Avoid greasy, spicy, heavy foods. If nausea and/or vomiting occur, drink only clear liquids until the nausea and/or vomiting subsides. Call your physician if vomiting continues.  Special Instructions/Symptoms: Your throat may feel dry or sore from the anesthesia or the breathing tube placed in your throat during surgery. If this causes discomfort, gargle with warm salt water. The discomfort should disappear within 24 hours.  If you had a scopolamine patch placed behind your ear for the management of post- operative nausea and/or vomiting:  1. The medication in the patch is effective for 72 hours, after which it should be removed.  Wrap patch in a tissue and discard in the trash. Wash hands thoroughly with soap and water. 2. You may remove the patch earlier than 72 hours if you experience unpleasant side effects which may include dry mouth, dizziness or visual disturbances. 3. Avoid touching the patch. Wash your hands with soap and water after contact with the patch.

## 2019-09-02 NOTE — Interval H&P Note (Signed)
History and Physical Interval Note:  09/02/2019 9:49 AM  Rhonda Marshall  has presented today for surgery, with the diagnosis of VOCAL CORD LESION.  The various methods of treatment have been discussed with the patient and family. After consideration of risks, benefits and other options for treatment, the patient has consented to  Procedure(s): DIRECT LARYNGOSCOPY WITH BIOPSY (N/A) as a surgical intervention.  The patient's history has been reviewed, patient examined, no change in status, stable for surgery.  I have reviewed the patient's chart and labs.  Questions were answered to the patient's satisfaction.     [name]

## 2019-09-02 NOTE — Anesthesia Procedure Notes (Signed)
Procedure Name: Intubation Date/Time: 09/02/2019 10:11 AM Performed by: [place] Yasmine Kilbourne C, CRNA Pre-anesthesia Checklist: Patient identified, Emergency Drugs available, Suction available and Patient being monitored Patient Re-evaluated:Patient Re-evaluated prior to induction Oxygen Delivery Method: Circle system utilized Preoxygenation: Pre-oxygenation with 100% oxygen Induction Type: IV induction Ventilation: Mask ventilation without difficulty Laryngoscope Size: Mac and 3 Grade View: Grade I Tube type: Oral Tube size: 6.0 mm Number of attempts: 1 Airway Equipment and Method: Stylet and Oral airway Placement Confirmation: ETT inserted through vocal cords under direct vision,  positive ETCO2 and breath sounds checked- equal and bilateral Tube secured with: Tape Dental Injury: Teeth and Oropharynx as per pre-operative assessment

## 2019-09-02 NOTE — Anesthesia Preprocedure Evaluation (Addendum)
Anesthesia Evaluation  Patient identified by MRN, date of birth, ID band Patient awake    Reviewed: Allergy & Precautions, NPO status , Patient's Chart, lab work & pertinent test results  Airway Mallampati: I  TM Distance: >3 FB Neck ROM: Full    Dental no notable dental hx. (+) Dental Advisory Given, Missing,    Pulmonary neg pulmonary ROS, Current Smoker and Patient abstained from smoking.,    Pulmonary exam normal breath sounds clear to auscultation       Cardiovascular hypertension, Pt. on home beta blockers and Pt. on medications Normal cardiovascular exam+ dysrhythmias (on eliquis) Atrial Fibrillation  Rhythm:Regular Rate:Normal  TTE 2020 LVEF 45-50%, mild global hypokinesis, left atrium moderately dilated, no significant valvular abnormalities  Stress Test 2018 unremarkable   Neuro/Psych PSYCHIATRIC DISORDERS Anxiety negative neurological ROS     GI/Hepatic negative GI ROS, Neg liver ROS,   Endo/Other  Hyperthyroidism   Renal/GU negative Renal ROS  negative genitourinary   Musculoskeletal  (+) Arthritis , Rheumatoid disorders,  narcotic dependent  Abdominal   Peds  Hematology negative hematology ROS (+)   Anesthesia Other Findings   Reproductive/Obstetrics                            Anesthesia Physical Anesthesia Plan  ASA: III  Anesthesia Plan: General   Post-op Pain Management:    Induction: Intravenous  PONV Risk Score and Plan: 2 and Midazolam, Dexamethasone and Ondansetron  Airway Management Planned: Oral ETT  Additional Equipment:   Intra-op Plan:   Post-operative Plan: Extubation in OR  Informed Consent: I have reviewed the patients History and Physical, chart, labs and discussed the procedure including the risks, benefits and alternatives for the proposed anesthesia with the patient or authorized representative who has indicated his/her understanding and  acceptance.     Dental advisory given  Plan Discussed with: CRNA  Anesthesia Plan Comments:         Anesthesia Quick Evaluation

## 2019-09-02 NOTE — Brief Op Note (Signed)
09/02/2019  10:36 AM  PATIENT:  Rhonda Marshall  69 y.o. female  PRE-OPERATIVE DIAGNOSIS:  VOCAL CORD LESION  POST-OPERATIVE DIAGNOSIS:  VOCAL CORD LESION  PROCEDURE:  Procedure(s): DIRECT LARYNGOSCOPY WITH BIOPSY (N/A)  SURGEON:  Surgeon(s) and Role:    Rozetta Nunnery, MD - Primary  PHYSICIAN ASSISTANT:   ASSISTANTS: none   ANESTHESIA:   general  EBL:  minimal   BLOOD ADMINISTERED:none  DRAINS: none   LOCAL MEDICATIONS USED:  NONE  SPECIMEN:  Source of Specimen:  left true vocal cord  DISPOSITION OF SPECIMEN:  PATHOLOGY  COUNTS:  YES  TOURNIQUET:  * No tourniquets in log *  DICTATION: Viviann Spare Dictation dictated # [id number]  PLAN OF CARE: Discharge to home after PACU  PATIENT DISPOSITION:  PACU - hemodynamically stable.   Delay start of Pharmacological VTE agent (>24hrs) due to surgical blood loss or risk of bleeding: yes

## 2019-09-03 NOTE — Op Note (Signed)
Rhonda Marshall, [facility]INS MEDICAL RECORD [id number] ACCOUNT [id number] DATE OF BIRTH:1950/08/06 FACILITY: [facility] LOCATION: [facility] [place], MD  OPERATIVE REPORT  DATE OF PROCEDURE:  09/02/2019  PREOPERATIVE DIAGNOSIS:  Chronic hoarseness.  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  OPERATION PERFORMED:  Microlaryngoscopy with incisional biopsy of left true vocal cord lesion.  SURGEON:  [name], MD  ANESTHESIA:  General endotracheal.  COMPLICATIONS:  None.  BRIEF CLINICAL NOTE:  The patient is a 70 year old female who has had chronic hoarseness for the past 2 months.  She has a history of smoking as well as history of cardiac problems with arrhythmias and is on Eliquis.  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  She is taken to the operating room at this time for direct laryngoscopy and biopsy of left true vocal cord.  DESCRIPTION OF PROCEDURE:  After adequate endotracheal anesthesia, the patient was intubated with a #6 endotracheal tube.  A direct laryngoscopy was performed.  The base of tongue, vallecula, and epiglottis were normal and evaluation of both piriform  sinuses were clear.  On evaluation of the vocal cords, the patient had leukoplakia involving both vocal cords with the left vocal cord a little bit larger.  Of note, on fiberoptic laryngoscopy in the office, the patient had normal mobility of both vocal  cords.  Several biopsies were obtained from the left true vocal cord, which was consistent with probable cancer.  The right true vocal cord was soft, and although it appeared dysplastic did not appear to have invasive carcinoma.  The left vocal cord  tumor extended up toward the anterior commissure, but really did not appear to cross the anterior commissure.  After obtaining  biopsies, hemostasis was obtained with cotton pledgets soaked in adrenaline.  This completed the procedure.  The patient was  awoken from anesthesia and transferred to recovery room and postoperatively doing well.  DISPOSITION:  Berkley will be discharged home later this morning.  She will call us next week concerning results of the biopsy.  Most likely will require radiation therapy for treatment of this.  She was instructed to take Tylenol and ibuprofen p.r.n.  pain.  Diet as tolerated.  LN/NUANCE  D:09/02/2019 T:09/03/2019 JOB:[id number]

## 2019-09-05 ENCOUNTER — Encounter: Payer: Self-pay | Admitting: *Deleted

## 2019-09-05 LAB — SURGICAL PATHOLOGY

## 2019-09-05 NOTE — Anesthesia Postprocedure Evaluation (Signed)
Anesthesia Post Note  Patient: Rhonda Marshall  Procedure(s) Performed: DIRECT LARYNGOSCOPY WITH BIOPSY (N/A Mouth)     Patient location during evaluation: PACU Anesthesia Type: General Level of consciousness: awake and alert Pain management: pain level controlled Vital Signs Assessment: post-procedure vital signs reviewed and stable Respiratory status: spontaneous breathing, nonlabored ventilation, respiratory function stable and patient connected to nasal cannula oxygen Cardiovascular status: blood pressure returned to baseline and stable Postop Assessment: no apparent nausea or vomiting Anesthetic complications: no    Last Vitals:  Vitals:   09/02/19 1212 09/02/19 1213  BP:  123/88  Pulse: (!) 107   Resp:  20  Temp:  36.7 C  SpO2: 96%     Last Pain:  Vitals:   09/02/19 1213  TempSrc:   PainSc: 0-No pain                 Marialuisa Basara L Inita Uram

## 2019-09-07 ENCOUNTER — Encounter (INDEPENDENT_AMBULATORY_CARE_PROVIDER_SITE_OTHER): Payer: Medicare Other | Admitting: Otolaryngology

## 2019-09-08 ENCOUNTER — Telehealth: Payer: Self-pay | Admitting: *Deleted

## 2019-09-08 NOTE — Progress Notes (Signed)
Head and Neck Cancer Location of Tumor / Histology:  09/02/19 FINAL MICROSCOPIC DIAGNOSIS: A. VOCAL CORD, LEFT, BIOPSY: - Invasive squamous cell carcinoma  Patient presented to Dr. 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   Nutrition Status Yes No Comments  Weight changes? [x]  []  Her weight has gone up and down.   Swallowing concerns? []  [x]    PEG? []  [x]     Referrals Yes No Comments  Social Work? []  [x]    Dentistry? []  [x]    Swallowing therapy? []  [x]    Nutrition? []  [x]    Med/Onc? []  [x]     Safety Issues Yes No Comments  Prior radiation? []  [x]    Pacemaker/ICD? []  [x]    Possible current pregnancy? []  [x]    Is the patient on methotrexate? []  [x]     Tobacco/Marijuana/Snuff/ETOH use: She is currently only taking several puffs per day per her report.    Past/Anticipated interventions by otolaryngology, if any:  09/02/19 OPERATION PERFORMED:  Microlaryngoscopy with incisional biopsy of left true vocal cord lesion. SURGEON:  [name], MD  Past/Anticipated interventions by medical oncology, if any: N/A   Current Complaints / other details:

## 2019-09-08 NOTE — Progress Notes (Addendum)
Radiation Oncology         [phone] ________________________________  Initial outpatient Consultation by telephone as patient was unable to access MyChart video during pandemic precautions   Name: Rhonda Marshall MRN: [id number]  Date: 09/09/2019  DOB: 1951/03/20  FO:YDXAJO, Berline Lopes, FNP  Rozetta Nunnery, *   REFERRING PHYSICIAN: Rozetta Nunnery, *  DIAGNOSIS:    ICD-10-CM   1. Malignant neoplasm of glottis ([place])  C32.0 Ambulatory referral to Social Work    Referral to Neuro Rehab    Amb Referral to Nutrition and Diabetic E  Cancer Staging Malignant neoplasm of glottis ([place]) Staging form: Larynx - Glottis, AJCC 8th Edition - Clinical stage from 09/09/2019: Stage I (cT1b, cN0, cM0) - Signed by Eppie Gibson, MD on 09/09/2019   CHIEF COMPLAINT: Here to discuss management of laryngeal cancer  HISTORY OF PRESENT ILLNESS::Rhonda Marshall is a 69 y.o. female who presented with chronic hoarseness. She first started having hoarseness around 07/15/2019, and it progressed. She was treated with a round of antibiotics for possible laryngitis, which she notes helped her feel better.  Subsequently, the patient saw Dr. Lucia Gaskins who performed laryngoscopy. 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 VC mobility intact per my discussion w/ Dr Lucia Gaskins.  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  Swallowing issues, if any: no  Weight Changes: fluctuating up and down, but fairly stable over the past year. Wt Readings from Last 3 Encounters:  09/02/19 134 lb 4.2 oz (60.9 kg)  05/06/19 135 lb 6.4 oz (61.4 kg)  09/09/18 139 lb 6.4 oz (63.2 kg)    Tobacco history, if any: current smoker -- cut down, "puffs" several times/day  ETOH abuse, if any: none, occasional/rare use  Prior cancers, if any: none  She sees Elayne Snare for Hyperthyroidism - she has been on PTU 50 mg twice a  day since October 2017  She may need help w/ transportation.  She has lost close family members recently.  PREVIOUS RADIATION THERAPY: No  PAST MEDICAL HISTORY:  has a past medical history of Anxiety, Atrial fibrillation ([place]), Family history of adverse reaction to anesthesia, Hypertension, Rheumatoid arthritis(714.0), and Subclinical hyperthyroidism.    PAST SURGICAL HISTORY: Past Surgical History:  Procedure Laterality Date  . Arm surgery     fracture, right arm  . CARDIOVERSION N/A 08/10/2014   Procedure: CARDIOVERSION;  Surgeon: Dorothy Spark, MD;  Location: [facility];  Service: Cardiovascular;  Laterality: N/A;  . CATARACT EXTRACTION    . DIRECT LARYNGOSCOPY N/A 09/02/2019   Procedure: DIRECT LARYNGOSCOPY WITH BIOPSY;  Surgeon: Rozetta Nunnery, MD;  Location: [facility];  Service: ENT;  Laterality: N/A;  . TUBAL LIGATION      FAMILY HISTORY: family history includes CAD (age of onset: 7) in her brother; Cancer in her brother and father; Diabetes in her brother; Heart attack (age of onset: 43) in her mother; Hypothyroidism in her mother and sister.  SOCIAL HISTORY:  reports that she has been smoking cigarettes. She started smoking about 54 years ago. She has a 13.50 pack-year smoking history. She has never used smokeless tobacco. She reports previous alcohol use. She reports that she does not use drugs.  ALLERGIES: Clindamycin/lincomycin and Latex  MEDICATIONS:  Current Outpatient Medications  Medication Sig Dispense Refill  . ALPRAZolam (XANAX) 1 MG tablet Take 1 mg by mouth 3 (three) times daily as needed for  anxiety.   0  . diltiazem (CARDIZEM CD) 120 MG 24 hr capsule Take 1 capsule (120 mg total) by mouth daily. OFFICE VISIT NEEDED 45 capsule 9  . ELIQUIS 5 MG TABS tablet Take 1 tablet (5 mg total) by mouth 2 (two) times daily. Appointment needed with cardiologist for further refills 180 tablet 0  . metoprolol (TOPROL-XL) 200 MG 24 hr tablet Take 1  tablet (200 mg total) by mouth 2 (two) times daily. 60 tablet 7  . oxyCODONE-acetaminophen (PERCOCET/ROXICET) 5-325 MG per tablet Take 1 tablet by mouth every 4 (four) hours as needed for severe pain.     Marland Kitchen propylthiouracil (PTU) 50 MG tablet Take 1 tablet (50 mg total) by mouth daily. TAKE 1 TABLET BY MOUTH ONCE DAILY. 60 tablet 3   No current facility-administered medications for this encounter.    REVIEW OF SYSTEMS:  Notable for that above.   PHYSICAL EXAM:  vitals were not taken for this visit.   General: Alert and oriented, in no acute distress . Mild hoarseness. Psychiatric: Judgment and insight are intact. Affect is appropriate.   before and after bx photos, from Dr Pollie Friar OR photos:   LABORATORY DATA:  Lab Results  Component Value Date   WBC 12.8 (H) 05/06/2019   HGB 14.6 05/06/2019   HCT 41.4 05/06/2019   MCV 95 05/06/2019   PLT 301 05/06/2019   CMP     Component Value Date/Time   NA 135 06/17/2018 1523   K 4.1 06/17/2018 1523   CL 101 06/17/2018 1523   CO2 27 06/17/2018 1523   GLUCOSE 102 (H) 06/17/2018 1523   BUN 24 (H) 06/17/2018 1523   CREATININE 1.35 (H) 06/17/2018 1523   CREATININE 0.73 07/26/2014 1441   CALCIUM 9.8 06/17/2018 1523   PROT 7.4 06/17/2018 1523   ALBUMIN 4.4 06/17/2018 1523   AST 13 06/17/2018 1523   ALT 13 07/05/2019 1352   ALKPHOS 68 06/17/2018 1523   BILITOT 0.4 06/17/2018 1523   GFRNONAA >60 11/08/2010 0448   GFRAA  11/08/2010 0448    >60        The eGFR has been calculated using the MDRD equation. This calculation has not been validated in all clinical situations. eGFR's persistently <60 mL/min signify possible Chronic Kidney Disease.      Lab Results  Component Value Date   TSH 0.44 07/05/2019     RADIOGRAPHY: No results found.    IMPRESSION/PLAN:  This is a delightful patient with head and neck cancer referred for non-surgical treatment by Dr Lucia Gaskins. I recommend 6 weeks of radiotherapy for this patient, 63Gy/28  fx.  We discussed the potential risks, benefits, and side effects of radiotherapy. We talked in detail about acute and late effects. We discussed that some of the most bothersome acute effects may be mucositis,  salivary changes, skin irritation, fatigue. We talked about late effects which include but are not necessarily limited to dysphagia, thyroid level changes (she knows to continue close f/u with Dr Dwyane Dee for labs), soft tissue/ laryngeal injury. No guarantees of treatment were given.  The patient is enthusiastic about proceeding with treatment. I look forward to participating in the patient's care.    I asked the patient today about tobacco use. The patient uses tobacco.  I advised the patient to quit. Services were offered by me today including outpatient counseling and pharmacotherapy. I assessed for the willingness to attempt to quit and provided encouragement and demonstrated willingness to make referrals and/or prescriptions to help  the patient attempt to quit. The patient has follow-up with the oncologic team to touch base on their tobacco use and /or cessation efforts.  Over 3 minutes were spent on this issue. She has plans to quit today and use Nicorette gum.    Simulation (treatment planning) will take place in the near future - orders placed  We also discussed that the treatment of head and neck cancer is a multidisciplinary process to maximize treatment outcomes and quality of life. For this reason the following referrals have been or will be made:   Nutritionist for nutrition support during and after treatment.   Speech language pathology for swallowing and/or speech therapy.   Social work for social support. Note transportation concerns and recent losses of family members.  This encounter was provided by telemedicine platform by telephone as patient was unable to access MyChart video during pandemic precautions The patient has given verbal consent for this type of encounter and  has been advised to only accept a meeting of this type in a secure network environment. The time spent during this encounter in total on service date was 50 minutes. The attendants for this meeting include Eppie Gibson  and [name]. During the encounter, Eppie Gibson was located at [facility] Radiation Oncology Department.  Rhonda Marshall was located at home.    __________________________________________   Eppie Gibson, MD   This document serves as a record of services personally performed by Eppie Gibson, MD. It was created on her behalf by Wilburn Mylar, a trained medical scribe. The creation of this record is based on the scribe's personal observations and the provider's statements to them. This document has been checked and approved by the attending provider.

## 2019-09-09 ENCOUNTER — Encounter: Payer: Self-pay | Admitting: Radiation Oncology

## 2019-09-09 ENCOUNTER — Other Ambulatory Visit: Payer: Self-pay

## 2019-09-09 ENCOUNTER — Encounter: Payer: Self-pay | Admitting: General Practice

## 2019-09-09 ENCOUNTER — Ambulatory Visit
Admission: RE | Admit: 2019-09-09 | Discharge: 2019-09-09 | Disposition: A | Discharge status: Home / Self Care | Payer: Medicare Other | Source: Ambulatory Visit | Attending: Radiation Oncology | Admitting: Radiation Oncology

## 2019-09-09 DIAGNOSIS — C320 Malignant neoplasm of glottis: Secondary | ICD-10-CM | POA: Insufficient documentation

## 2019-09-09 NOTE — Progress Notes (Signed)
[place] Initial Psychosocial Assessment Clinical Social Work  Clinical Social Work contacted by phone to assess psychosocial, emotional, mental health, and spiritual needs of the patient.   Barriers to care/review of distress screen:  - Transportation:  Do you anticipate any problems getting to appointments?  Do you have someone who can help run errands for you if you need it?  Has car but is older, "I will first try to drive and see if itll make it back and forth.  Wants to try to drive herself.   - Help at home:  What is your living situation (alone, family, other)?  If you are physically unable to care for yourself, who would you call on to help you?  Lived w son who died on Christmas of broken neck.  Moved in w preacher's family and helping w care of children in the home.  Hopes to get herself a "small place" in the future.  Family works during the day and is also involved in community activities.   - Support system:  What does your support system look like?  Who would you call on if you needed some kind of practical help?  What if you needed someone to talk to for emotional support?  Lives w family of preacher from her church.  Teenage son is home during home.  Has two living sons Chief Technology Officer - 91 - lives in [place], Marcello Moores - was in motorcycle wreck recently, was in a coma, accident was in November).  Has two sisters who are "loyal."  Husband and daughter died some time ago, lived w disabled son who was in a wheelchair.  Died 2019/07/10 - Finances:  Are you concerned about finances.  Considering returning to work?  If not, applying for disability?  Gets support from son.  Also gets Fish farm manager retirement funds, on [employer].  Has [facility] Medicare and Medicaid, "so far its been good."  What is your understanding of where you are with your cancer? Its cause?  Your treatment plan and what happens next?  New diagnosis of head/neck cancer, will be treated w radiation therapy.  Aware that she will need to keep  appointments.  Had been hoarse, could not speak when son was in hospital.  Thought it was laryngitis, surprised that she was diagnosed w cancer.  "I ain't got no choice, I just gotta do it, I hope it isn't painful."   Was eating mostly soft foods due to pain.    What are your worries for the future as you begin treatment for cancer?  Pain, "hopefully I don't lose voice"  Voice has gotten better since recent surgery.  I am concerned about having to be still during radiation treatment - you can breathe w that thing on can't you??"  She is anxious about treatments and being too anxious to continue treatments.    What are your hopes and priorities during your treatment? What is important to you? What are your goals for your care?  "My family is very important to me."  CSW Summary:  Patient and family psychosocial functioning including strengths, limitations, and coping skills:  69 year old female, recently diagnosed with laryngeal cancer.  Had prolonged period of hoarseness, noted when one of her sons was hospitalized after serious motorcycle wreck and she could not talk on the phone.  Was encouraged to seek specialist care, laryngeal cancer was diagnosed,  She has been told to anticipate 6 - 7 weeks of daily radiation.  She lived w  a son who was disabled/in a wheelchair after breaking his neck in an industrial accident.  This son passed away Christmas 2020.  Patient also lost her husband and daughter over 12 years ago.  After death of disabled son, she was encouraged to move in w her 21 and family so that she would not be alone.  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  She feels supported by this family, but hopes to get her own place at some point.  She is anxious about the upcoming treatments - says she wonders if she will be able to breathe with "that thing on my neck."  Reports that she has a history of anxiety and takes "nerve pills" at night.  Will need support as she  undergoes radiation therapy.  No current financial stressors, has retirement income, Physicist, medical, [employer] and [employer].  Wants to try to drive herself to radiation appointments, but was encouraged to let us know ASAP if she runs into any difficulties or car trouble.  Stressed the importance of keeping all appointments as scheduled.    Identifications of barriers to care: potential issues w transportation, she has been asked to alert [place] of any difficulties.  Availability of community resources:  [place] transport, Medicaid transport, [facility] are all possibilities to address transportation issues.  She has support from church regarding multiple deaths in her family.  Can be offered grief counseling through [facility] if she desires in future  Clinical Social Worker follow up needed: Yes.    Respond to needs as they arise  Edwyna Shell, [place] Worker Phone:  [phone] Cell:  [phone]

## 2019-09-11 NOTE — Telephone Encounter (Signed)
Oncology Nurse Navigator Documentation  Placed introductory call to new referral patient Ms. Dyment.  Introduced myself as the H&N oncology nurse navigator that works with Dr. Isidore Moos to whom she has been referred by Dr. Lucia Gaskins.  She confirmed understanding of referral.  Briefly explained my role as her navigator, provided my contact information.   Confirmed understanding of upcoming appts and [place] location, explained arrival and registration process.  I explained the purpose of a dental evaluation prior to starting RT, indicated she may be referred by Dr. Isidore Moos.     I encouraged her to call with questions/concerns as moving forward with appts and procedures.    She verbalized understanding of information provided, expressed appreciation for my call.  Navigator Initial Assessment . Employment Status: retired . Currently on FMLA / STD: N/A . Living Situation:  Son lives with her . Support System: family/friends . PCP: yes . PCD: yes; has partial wh/ she doesn't wear, a few teeth . Financial Concerns: no . Transportation Needs: yes, would like rides once RT starts . Sensory Deficits: no . Language Barriers/Interpreter Needed:  no . Ambulation Needs: no . DME Used in Home: no . Psychosocial Needs:  no . Concerns/Needs Understanding Cancer:  addressed/answered by navigator to best of ability . Self-Expressed Needs: no

## 2019-09-12 ENCOUNTER — Telehealth: Payer: Self-pay | Admitting: *Deleted

## 2019-09-12 NOTE — Telephone Encounter (Signed)
Oncology Nurse Navigator Documentation  Called Rhonda Marshall to see if she had any questions s/p Friday's Teleconsult with Dr. Isidore Moos. I addressed her concerns re SEs, she voiced understanding. I confirmed her understanding of 3/8 1:00 CT SIM and 3/11 2:15 [place], encouraged arrival to [facility] to facilitate registration and arrival to Radiation Waiting 15 minutes prior to appts. I encouraged her to call me with questions/concerns prior to next Monday.  She agreed to do so.  Gayleen Orem, RN, BSN Head & Neck Oncology Nurse [place] at [place] [phone]

## 2019-09-16 ENCOUNTER — Ambulatory Visit: Payer: Medicare Other | Admitting: Radiation Oncology

## 2019-09-19 ENCOUNTER — Encounter: Payer: Self-pay | Admitting: Nutrition

## 2019-09-19 ENCOUNTER — Other Ambulatory Visit: Payer: Self-pay

## 2019-09-19 ENCOUNTER — Ambulatory Visit
Admission: RE | Admit: 2019-09-19 | Discharge: 2019-09-19 | Disposition: A | Discharge status: Home / Self Care | Payer: Medicare Other | Source: Ambulatory Visit | Attending: Radiation Oncology | Admitting: Radiation Oncology

## 2019-09-19 ENCOUNTER — Inpatient Hospital Stay: Payer: Medicare Other | Attending: Radiation Oncology | Admitting: Nutrition

## 2019-09-19 DIAGNOSIS — Z510 Encounter for antineoplastic radiation therapy: Secondary | ICD-10-CM | POA: Diagnosis present

## 2019-09-19 DIAGNOSIS — C320 Malignant neoplasm of glottis: Secondary | ICD-10-CM | POA: Diagnosis present

## 2019-09-19 DIAGNOSIS — C329 Malignant neoplasm of larynx, unspecified: Secondary | ICD-10-CM | POA: Insufficient documentation

## 2019-09-19 NOTE — Progress Notes (Signed)
Patient did not show up for nutrition appointment. 

## 2019-09-20 DIAGNOSIS — Z510 Encounter for antineoplastic radiation therapy: Secondary | ICD-10-CM | POA: Diagnosis not present

## 2019-09-21 ENCOUNTER — Telehealth: Payer: Self-pay | Admitting: *Deleted

## 2019-09-21 NOTE — Telephone Encounter (Signed)
Oncology Nurse Navigator Documentation  Returned call to Ms. Fullard, confirmed tomorrow's 2:15 RT appt, encouraged lobby registration for 2:00 arrival to Radiation Waiting.  She voiced understanding.  Gayleen Orem, RN, BSN Head & Neck Oncology Nurse [place] at [place] [phone]

## 2019-09-22 ENCOUNTER — Encounter: Payer: Self-pay | Admitting: *Deleted

## 2019-09-22 ENCOUNTER — Ambulatory Visit
Admission: RE | Admit: 2019-09-22 | Discharge: 2019-09-22 | Disposition: A | Discharge status: Home / Self Care | Payer: Medicare Other | Source: Ambulatory Visit | Attending: Radiation Oncology | Admitting: Radiation Oncology

## 2019-09-22 ENCOUNTER — Other Ambulatory Visit: Payer: Self-pay

## 2019-09-22 DIAGNOSIS — Z510 Encounter for antineoplastic radiation therapy: Secondary | ICD-10-CM | POA: Diagnosis not present

## 2019-09-22 DIAGNOSIS — C320 Malignant neoplasm of glottis: Secondary | ICD-10-CM

## 2019-09-22 MED ORDER — SONAFINE EX EMUL
1.0000 "application " | Freq: Once | CUTANEOUS | Status: AC
  Administered 2019-09-22: 1 via TOPICAL

## 2019-09-22 NOTE — Progress Notes (Signed)

## 2019-09-23 ENCOUNTER — Other Ambulatory Visit: Payer: Self-pay

## 2019-09-23 ENCOUNTER — Ambulatory Visit
Admission: RE | Admit: 2019-09-23 | Discharge: 2019-09-23 | Disposition: A | Discharge status: Home / Self Care | Payer: Medicare Other | Source: Ambulatory Visit | Attending: Radiation Oncology | Admitting: Radiation Oncology

## 2019-09-23 DIAGNOSIS — Z510 Encounter for antineoplastic radiation therapy: Secondary | ICD-10-CM | POA: Diagnosis not present

## 2019-09-23 NOTE — Progress Notes (Signed)
Oncology Nurse Navigator Documentation  To provide support, encouragement and care continuity, met with [name] prior to and after her initial RT.  She was accompanied by her sister. Provided New Patient Information packet, discussed contents: Contact information for physician(s), myself, other members of the Care Team. Advance Directive information ([place] blue pamphlet with LCSW contact info).  Fall Prevention Patient Safety Plan Financial Assistance Information sheet [place] with highlight of [place] SLP information sheet Symptom Management Clinic information  Escorted her to [facility] for treatment, reviewed the 2-step process.  She completed treatment without difficulty, denied questions/concerns.  I reviewed the registration/arrival procedure for subsequent treatments. I encouraged them to contact me with questions/concerns as treatments/procedures begin.   Gayleen Orem, RN, BSN Head & Neck Oncology Nurse [place] at [place] [phone]

## 2019-09-24 ENCOUNTER — Other Ambulatory Visit: Payer: Self-pay | Admitting: Cardiology

## 2019-09-26 ENCOUNTER — Other Ambulatory Visit: Payer: Self-pay | Admitting: Radiation Oncology

## 2019-09-26 ENCOUNTER — Ambulatory Visit
Admission: RE | Admit: 2019-09-26 | Discharge: 2019-09-26 | Disposition: A | Discharge status: Home / Self Care | Payer: Medicare Other | Source: Ambulatory Visit | Attending: Radiation Oncology | Admitting: Radiation Oncology

## 2019-09-26 ENCOUNTER — Other Ambulatory Visit: Payer: Self-pay

## 2019-09-26 DIAGNOSIS — C320 Malignant neoplasm of glottis: Secondary | ICD-10-CM

## 2019-09-26 DIAGNOSIS — Z510 Encounter for antineoplastic radiation therapy: Secondary | ICD-10-CM | POA: Diagnosis not present

## 2019-09-26 MED ORDER — LIDOCAINE VISCOUS HCL 2 % MT SOLN: OROMUCOSAL | 3 refills | Status: DC

## 2019-09-27 ENCOUNTER — Ambulatory Visit
Admission: RE | Admit: 2019-09-27 | Discharge: 2019-09-27 | Disposition: A | Discharge status: Home / Self Care | Payer: Medicare Other | Source: Ambulatory Visit | Attending: Radiation Oncology | Admitting: Radiation Oncology

## 2019-09-27 ENCOUNTER — Other Ambulatory Visit: Payer: Self-pay

## 2019-09-27 DIAGNOSIS — Z510 Encounter for antineoplastic radiation therapy: Secondary | ICD-10-CM | POA: Diagnosis not present

## 2019-09-28 ENCOUNTER — Encounter: Payer: Self-pay | Admitting: *Deleted

## 2019-09-28 ENCOUNTER — Ambulatory Visit
Admission: RE | Admit: 2019-09-28 | Discharge: 2019-09-28 | Disposition: A | Discharge status: Home / Self Care | Payer: Medicare Other | Source: Ambulatory Visit | Attending: Radiation Oncology | Admitting: Radiation Oncology

## 2019-09-28 ENCOUNTER — Telehealth: Payer: Self-pay | Admitting: *Deleted

## 2019-09-28 DIAGNOSIS — Z510 Encounter for antineoplastic radiation therapy: Secondary | ICD-10-CM | POA: Diagnosis not present

## 2019-09-28 NOTE — Telephone Encounter (Signed)
CALLED PATIENT TO ASK ABOUT SETTING UP NUTRITION APPTS. PATIENT AGREED TO COME IN ON 10-10-19 AND TO HAVE WEEKLY TELEPHONE FU CALLS THERE AFTER

## 2019-09-28 NOTE — Progress Notes (Signed)
Oncology Nurse Navigator Documentation  Met with MS. Bittel prior to RT appt, reminded her of tomorrow's 11:00 [facility] appt to meet with SLP prior to 11:45 RT, encouraged 10:40 arrival for lobby registration followed by arrival to Radiation Waiting.  She voiced understanding.  Rick , RN, BSN Head & Neck Oncology Nurse Navigator [facility] at [place] [phone]  

## 2019-09-29 ENCOUNTER — Ambulatory Visit
Admission: RE | Admit: 2019-09-29 | Discharge: 2019-09-29 | Disposition: A | Discharge status: Home / Self Care | Payer: Medicare Other | Source: Ambulatory Visit | Attending: Radiation Oncology | Admitting: Radiation Oncology

## 2019-09-29 ENCOUNTER — Encounter: Payer: Self-pay | Admitting: *Deleted

## 2019-09-29 ENCOUNTER — Ambulatory Visit: Payer: Medicare Other | Attending: Radiation Oncology

## 2019-09-29 ENCOUNTER — Other Ambulatory Visit: Payer: Self-pay

## 2019-09-29 DIAGNOSIS — R1310 Dysphagia, unspecified: Secondary | ICD-10-CM | POA: Diagnosis not present

## 2019-09-29 DIAGNOSIS — Z510 Encounter for antineoplastic radiation therapy: Secondary | ICD-10-CM | POA: Diagnosis not present

## 2019-09-29 DIAGNOSIS — R490 Dysphonia: Secondary | ICD-10-CM | POA: Insufficient documentation

## 2019-09-29 NOTE — Patient Instructions (Signed)
SWALLOWING EXERCISES Do these until 6 months after your last day of radiation, then 2 times per week afterwards  1. Effortful Swallows - Press your tongue against the roof of your mouth for 3 seconds, then squeeze the muscles in your neck while you swallow your saliva or a sip of water - Repeat 10-15 times, 2-3 times a day, and use whenever you eat or drink  2. Pitch Raise - Repeat "he", once per second in as high of a pitch as you can - Repeat 20 times, 2-3 times a day  3. Siren exercise  Say "eeeeeee" starting at as low of a pitch as you can and glide up to as high as you can, then back down as low as you can  Do this 10 times, twice a day

## 2019-09-29 NOTE — Therapy (Signed)
[facility] [address] [place], [place], [address] Phone: [phone]   Fax:  [phone]  Speech Language Pathology Evaluation  Patient Details  Name: Rhonda Marshall MRN: [id number] Date of Birth: 03-Aug-1950 Referring Provider (SLP): Eppie Gibson, MD   Encounter Date: 09/29/2019  End of Session - 09/29/19 1222    Visit Number  1    Number of Visits  3    Date for SLP Re-Evaluation  12/28/19   90 daya   SLP Start Time  [id number]    SLP Stop Time   1130    SLP Time Calculation (min)  40 min    Activity Tolerance  Patient tolerated treatment well       Past Medical History:  Diagnosis Date  . Anxiety   . Atrial fibrillation ([place])    newly diagnosed 10/2010  . Family history of adverse reaction to anesthesia    Sister's BP drops   . Hypertension   . Rheumatoid arthritis(714.0)   . Subclinical hyperthyroidism     Past Surgical History:  Procedure Laterality Date  . Arm surgery     fracture, right arm  . CARDIOVERSION N/A 08/10/2014   Procedure: CARDIOVERSION;  Surgeon: Dorothy Spark, MD;  Location: [facility];  Service: Cardiovascular;  Laterality: N/A;  . CATARACT EXTRACTION    . DIRECT LARYNGOSCOPY N/A 09/02/2019   Procedure: DIRECT LARYNGOSCOPY WITH BIOPSY;  Surgeon: Rozetta Nunnery, MD;  Location: [facility];  Service: ENT;  Laterality: N/A;  . TUBAL LIGATION      There were no vitals filed for this visit.  Subjective Assessment - 09/29/19 1049    Subjective  Pt and daughter in law report pt is not having diffiuclty with swalowing currently.    Patient is accompained by:  Family member   daughter in law   Currently in Pain?  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 Pt began RT to tumor began 09-22-19 (last day 10-31-19)      Prior Functional Status   Cognitive/Linguistic Baseline  Within functional limits   possible difficulty with sustained attention?    Lives With  Family;Son    Available Support  Family    Vocation  Retired      Associate Professor   Overall Cognitive Status  --   possible deficit in attention (memory?)     Motor Speech   Phonation  Hoarse   mild-mod       Pt currently tolerates regular diet and thin liquids. Oral motor assessment revealed WNL lingual ROM and WNL lingual strength. Labial ROM was WNL and strength was WNL. Velar ROM was deferred.  POs: Pt ate ham sandwich and drank H2O without overt s/s aspiration. Thyroid elevation appeared adequte, and swallows appeared timely. Pt's swallow deemed WNL/WFL at this time.  Because data states the risk for dysphagia during and after radiation treatment is high due to undergoing radiation tx, SLP taught pt about the possibility of reduced/limited ability for PO intake during rad tx. SLP encouraged pt to continue swallowing POs as far into rad tx as possible.   SLP educated pt re: changes to swallowing musculature after rad tx, and why adherence to dysphagia HEP provided today and PO consumption was necessary to inhibit muscle fibrosis following rad tx. Pt demonstrated understanding of these things to SLP. Daughter in law was present to assist pt if she should have difficulty remembering these things.   SLP then developed a HEP for pt and pt was instructed how to perform exercises involving lingual, vocal, and pharyngeal strengthening. SLP performed each exercise and pt  return demonstrated each exercise. Pt had some difficulty with the pitch exercises, with saying "he" instead of "who", and obtaining high pitch - pt continually performed high VOLUME. SLP ensured daughter in law understood exercise to cont to work with pt at home prior to moving on to next exercise. Daughter in law stated she would work with pt as needed at home. Pt and daughter in law were instructed that pt should complete this program 2-3 times a day,until 6 months after his or her last rad tx, then x2-3 a week after that.                   SLP Education - 09/29/19 1138    Education Details  HEP procedure, late effects head/neck radiation on swallowing    Person(s) Educated  Patient;Caregiver(s)    Methods  Explanation;Demonstration;Verbal cues    Comprehension  Verbalized understanding;Returned demonstration;Verbal cues required;Need further instruction       SLP Short Term Goals - 09/29/19 1225      SLP SHORT TERM GOAL #1   Title  Pt will complete HEP with occasional min A    Time  1    Period  --   sessions, for all STGs   Status  New      SLP SHORT TERM GOAL #2   Title  Pt will tell SLP rationale for HEP completion, 10 minutes after pt given initial max cues    Time  1    Status  New      SLP SHORT TERM GOAL #3   Title  Pt will tell SLP how a food journal can hasten/facilitate return to more normalized diet with occasional min A    Time  1    Status  New      SLP SHORT TERM GOAL #4   Title  pt will tell SLP 2 overt s/sx aspiration PNA with modified independence    Time  2    Status  New       SLP Long Term Goals - 09/29/19 1227      SLP LONG TERM GOAL #1   Title  Pt will complete HEP with rare min A over 3 visits    Time  5    Period  --   sessions, for all LTGs   Status  New      SLP LONG TERM GOAL #2   Title  Pt will tell SLP when to decr frequency of HEP with min A, after 10 minute delay from initial cue    Time  6    Status  New        Plan - 09/29/19 1223    Clinical Impression Statement  Pt presents today with WNL/WFL swallowing ability with ham sandwich and water. No overt s/sx aspiration PNA reported or observed today. Data suggests that as pts progress through rad or chemorad therapy that their swallowing ability will decrease. 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 Therefore, SLP developed an indivdualized exercise program to mitigate/eliminate pt's muscle fibrosis following and as a result of, pt's rad tx. Skilled ST would be beneficial to the pt in order to regularly assess pt's safety with POs and/or need for instrumental swallow assessment, as well as to assess accurate completion of swallowing HEP.    Speech Therapy Frequency  --   once approx every 4 weeks   Duration  --   6 sessions (7 total sessions)   Treatment/Interventions  Aspiration precaution training;Pharyngeal strengthening exercises;Diet toleration management by SLP;Trials of upgraded texture/liquids;Internal/external aids;Patient/family education;SLP instruction and feedback;Compensatory strategies    Potential to Achieve Goals  Good    Potential Considerations  Ability to learn/carryover information    SLP Home Exercise Plan  provided today    Consulted and Agree with Plan of Care  Patient       Patient will benefit from skilled therapeutic intervention in order to improve the following deficits and impairments:   Dysphagia, unspecified type    Problem List Patient Active Problem List   Diagnosis Date Noted  . 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 Leg swelling 05/05/2019  . Hyperthyroidism 12/01/2016  . Dyspnea on exertion 07/30/2015  . NICM (nonischemic cardiomyopathy) ([place]) 07/30/2015  . Right knee pain 05/31/2015  . Rheumatoid arthritis ([place]) 07/21/2014  . Smoker 07/21/2014  . Anticoagulation adequate 06/05/2014  . A-fib ([place]) 11/14/2010  . Reflux  11/14/2010    [facility] ,MS, CCC-SLP  09/29/2019, 12:30 PM  [facility] [address] [place], [place], [address] Phone: [phone]   Fax:  [phone]  Name: Rhonda Marshall MRN: [id number] Date of Birth: [birth year]

## 2019-09-30 ENCOUNTER — Other Ambulatory Visit: Payer: Self-pay

## 2019-09-30 ENCOUNTER — Ambulatory Visit
Admission: RE | Admit: 2019-09-30 | Discharge: 2019-09-30 | Disposition: A | Discharge status: Home / Self Care | Payer: Medicare Other | Source: Ambulatory Visit | Attending: Radiation Oncology | Admitting: Radiation Oncology

## 2019-09-30 DIAGNOSIS — Z510 Encounter for antineoplastic radiation therapy: Secondary | ICD-10-CM | POA: Diagnosis not present

## 2019-09-30 NOTE — Progress Notes (Signed)
Oncology Nurse Navigator Documentation  Ms. Mendell arrived for [facility], was seen by SLP Garald Balding after which she had daily RT.   Gayleen Orem, RN, BSN Head & Neck Oncology [place] at [place] [phone]

## 2019-10-03 ENCOUNTER — Other Ambulatory Visit: Payer: Self-pay

## 2019-10-03 ENCOUNTER — Ambulatory Visit
Admission: RE | Admit: 2019-10-03 | Discharge: 2019-10-03 | Disposition: A | Discharge status: Home / Self Care | Payer: Medicare Other | Source: Ambulatory Visit | Attending: Radiation Oncology | Admitting: Radiation Oncology

## 2019-10-03 ENCOUNTER — Encounter: Payer: Self-pay | Admitting: *Deleted

## 2019-10-03 DIAGNOSIS — Z510 Encounter for antineoplastic radiation therapy: Secondary | ICD-10-CM | POA: Diagnosis not present

## 2019-10-03 NOTE — Progress Notes (Signed)
[facility] Clinical Social Work  Hol[facility] representative contacted patient at home to follow up after head and neck MDC, and assess for needs and offer additional support.  Patient stated she had received 7 treatments, and so far is doing "ok".  Patient stated her sister was driving her to her treatments, and did not expect to have any transportation issues.  Patient did not express any additional needs or concerns at this time.  CSW reviewed support services available at [facility], and encouraged patient to call with needs or concerns.    Johnnye Lana, MSW, LCSW, OSW-C Clinical Social Worker [facility] [phone]

## 2019-10-04 ENCOUNTER — Other Ambulatory Visit: Payer: Self-pay

## 2019-10-04 ENCOUNTER — Ambulatory Visit
Admission: RE | Admit: 2019-10-04 | Discharge: 2019-10-04 | Disposition: A | Discharge status: Home / Self Care | Payer: Medicare Other | Source: Ambulatory Visit | Attending: Radiation Oncology | Admitting: Radiation Oncology

## 2019-10-04 DIAGNOSIS — Z510 Encounter for antineoplastic radiation therapy: Secondary | ICD-10-CM | POA: Diagnosis not present

## 2019-10-05 ENCOUNTER — Other Ambulatory Visit: Payer: Self-pay

## 2019-10-05 ENCOUNTER — Ambulatory Visit
Admission: RE | Admit: 2019-10-05 | Discharge: 2019-10-05 | Disposition: A | Discharge status: Home / Self Care | Payer: Medicare Other | Source: Ambulatory Visit | Attending: Radiation Oncology | Admitting: Radiation Oncology

## 2019-10-05 ENCOUNTER — Other Ambulatory Visit (INDEPENDENT_AMBULATORY_CARE_PROVIDER_SITE_OTHER): Payer: Medicare Other

## 2019-10-05 DIAGNOSIS — E041 Nontoxic single thyroid nodule: Secondary | ICD-10-CM | POA: Diagnosis not present

## 2019-10-05 DIAGNOSIS — Z510 Encounter for antineoplastic radiation therapy: Secondary | ICD-10-CM | POA: Diagnosis not present

## 2019-10-05 LAB — T3, FREE: T3, Free: 4.8 pg/mL — ABNORMAL HIGH (ref 2.3–4.2)

## 2019-10-05 LAB — TSH: TSH: 0.26 u[IU]/mL — ABNORMAL LOW (ref 0.35–4.50)

## 2019-10-05 LAB — T4, FREE: Free T4: 0.82 ng/dL (ref 0.60–1.60)

## 2019-10-06 ENCOUNTER — Ambulatory Visit
Admission: RE | Admit: 2019-10-06 | Discharge: 2019-10-06 | Disposition: A | Discharge status: Home / Self Care | Payer: Medicare Other | Source: Ambulatory Visit | Attending: Radiation Oncology | Admitting: Radiation Oncology

## 2019-10-06 ENCOUNTER — Other Ambulatory Visit: Payer: Self-pay

## 2019-10-06 DIAGNOSIS — Z510 Encounter for antineoplastic radiation therapy: Secondary | ICD-10-CM | POA: Diagnosis not present

## 2019-10-07 ENCOUNTER — Other Ambulatory Visit: Payer: Self-pay

## 2019-10-07 ENCOUNTER — Ambulatory Visit
Admission: RE | Admit: 2019-10-07 | Discharge: 2019-10-07 | Disposition: A | Discharge status: Home / Self Care | Payer: Medicare Other | Source: Ambulatory Visit | Attending: Radiation Oncology | Admitting: Radiation Oncology

## 2019-10-07 DIAGNOSIS — Z510 Encounter for antineoplastic radiation therapy: Secondary | ICD-10-CM | POA: Diagnosis not present

## 2019-10-10 ENCOUNTER — Ambulatory Visit
Admission: RE | Admit: 2019-10-10 | Discharge: 2019-10-10 | Disposition: A | Discharge status: Home / Self Care | Payer: Medicare Other | Source: Ambulatory Visit | Attending: Radiation Oncology | Admitting: Radiation Oncology

## 2019-10-10 ENCOUNTER — Other Ambulatory Visit: Payer: Self-pay

## 2019-10-10 ENCOUNTER — Encounter: Payer: Self-pay | Admitting: Nutrition

## 2019-10-10 ENCOUNTER — Encounter: Payer: Self-pay | Admitting: *Deleted

## 2019-10-10 ENCOUNTER — Inpatient Hospital Stay: Payer: Medicare Other | Admitting: Nutrition

## 2019-10-10 ENCOUNTER — Other Ambulatory Visit: Payer: Self-pay | Admitting: Radiation Oncology

## 2019-10-10 ENCOUNTER — Telehealth: Payer: Self-pay | Admitting: Cardiology

## 2019-10-10 DIAGNOSIS — Z510 Encounter for antineoplastic radiation therapy: Secondary | ICD-10-CM | POA: Diagnosis not present

## 2019-10-10 DIAGNOSIS — C320 Malignant neoplasm of glottis: Secondary | ICD-10-CM

## 2019-10-10 DIAGNOSIS — E860 Dehydration: Secondary | ICD-10-CM | POA: Insufficient documentation

## 2019-10-10 LAB — BASIC METABOLIC PANEL - CANCER CENTER ONLY
Anion gap: 11 (ref 5–15)
BUN: 12 mg/dL (ref 8–23)
CO2: 26 mmol/L (ref 22–32)
Calcium: 10.2 mg/dL (ref 8.9–10.3)
Chloride: 104 mmol/L (ref 98–111)
Creatinine: 1.34 mg/dL — ABNORMAL HIGH (ref 0.44–1.00)
GFR, Est AFR Am: 46 mL/min — ABNORMAL LOW (ref >60)
GFR, Estimated: 40 mL/min — ABNORMAL LOW (ref >60)
Glucose, Bld: 99 mg/dL (ref 70–99)
Potassium: 4.7 mmol/L (ref 3.5–5.1)
Sodium: 141 mmol/L (ref 135–145)

## 2019-10-10 MED ORDER — METOPROLOL TARTRATE 100 MG PO TABS: 200.0000 mg | ORAL_TABLET | Freq: Two times a day (BID) | ORAL | 5 refills | Status: DC

## 2019-10-10 NOTE — Telephone Encounter (Signed)
Please advise if okay to change?  Thank you!  Will route to PharmD.

## 2019-10-10 NOTE — Telephone Encounter (Signed)
Pt c/o medication issue:  1. Name of Medication: metoprolol (TOPROL-XL) 200 MG 24 hr tablet  2. How are you currently taking this medication (dosage and times per day)? Twice a day  3. Are you having a reaction (difficulty breathing--STAT)? no  4. What is your medication issue? Elizabeth from [facility] requesting the patient's medication to metoprolol tatrate 100mg  twice a day instead because the patient is having trouble swallowing it. She also states 200mg  of the metoprolol is usually only once a day. She would like that medication to also be reviewed.

## 2019-10-10 NOTE — Progress Notes (Signed)
Oncology Nurse Navigator Documentation  Met with Ms. Novitsky during weekly PUT with Dr. Isidore Moos.  She was joined by her sister Webb Silversmith. Provided them a brief introduction to PEG tube:  Showed example, demonstrated water bolus flush, provided PET education handout.  Explained process for placement.  Explained criteria for removal s/p EOT. Explained I will coordinate appt for PEG placement, will provide more detailed education re PEG use and care prior to procedure. She and sister voiced understanding of information provided.  Gayleen Orem, RN, BSN Head & Neck Oncology Nurse [place] at [place] [phone]

## 2019-10-10 NOTE — Telephone Encounter (Signed)
Patient on Metoprolol succinate 200mg  every 12 hour since 2018  to maximum dose of 400mg /day.  Patient currently unable to swallow metoprolol succinate and will need 400mg  of metoprolol tartrate to be able to crush tablets.  Will send new Rx to pharmacy as requested.

## 2019-10-10 NOTE — Progress Notes (Addendum)
Patient evaluated by Dr. Isidore Moos after radiation treatment today. Patient has had a 9-lb weight loss and reports having difficulty/eating and drinking. Orders placed for BMP today and for patient to come back tomorrow for IVF in [facility] Learta Codding [facility] aware and agreeable).   H/N navigation team working on getting patient scheduled for PEG tube placement later this week. Per Dr. Isidore Moos patient may need more IVF this week depending on when PEG placement appointment can be made. Appointments made for 10/13/19, 10/15/19, 10/17/19, and 10/19/19 for IVF, and orders for 536mL-NS over 2hrs placed under supportive treatment.   Patient called with update and aware of IVF appointment tomorrow morning and future ones made if needed. Verbalized understanding and agreement. No other needs identified at this time.

## 2019-10-10 NOTE — Progress Notes (Signed)
Patient did not show up for nutrition appointment. 

## 2019-10-11 ENCOUNTER — Other Ambulatory Visit: Payer: Self-pay

## 2019-10-11 ENCOUNTER — Other Ambulatory Visit (HOSPITAL_COMMUNITY): Payer: Medicare Other

## 2019-10-11 ENCOUNTER — Inpatient Hospital Stay: Payer: Medicare Other

## 2019-10-11 ENCOUNTER — Encounter: Payer: Self-pay | Admitting: *Deleted

## 2019-10-11 ENCOUNTER — Telehealth (INDEPENDENT_AMBULATORY_CARE_PROVIDER_SITE_OTHER): Payer: Medicare Other | Admitting: Endocrinology

## 2019-10-11 ENCOUNTER — Ambulatory Visit
Admission: RE | Admit: 2019-10-11 | Discharge: 2019-10-11 | Disposition: A | Discharge status: Home / Self Care | Payer: Medicare Other | Source: Ambulatory Visit | Attending: Radiation Oncology | Admitting: Radiation Oncology

## 2019-10-11 DIAGNOSIS — C329 Malignant neoplasm of larynx, unspecified: Secondary | ICD-10-CM | POA: Diagnosis not present

## 2019-10-11 DIAGNOSIS — C320 Malignant neoplasm of glottis: Secondary | ICD-10-CM

## 2019-10-11 DIAGNOSIS — E041 Nontoxic single thyroid nodule: Secondary | ICD-10-CM | POA: Diagnosis not present

## 2019-10-11 DIAGNOSIS — Z510 Encounter for antineoplastic radiation therapy: Secondary | ICD-10-CM | POA: Diagnosis not present

## 2019-10-11 MED ORDER — SODIUM CHLORIDE 0.9 % IV SOLN
INTRAVENOUS | Status: AC
  Filled 2019-10-11 (×2): qty 250

## 2019-10-11 NOTE — Progress Notes (Signed)
Oncology Nurse Navigator Documentation  Spoke with Mr. Hinch' sister Webb Silversmith, informed her of new appts:  12:15 PEG education tomorrow prior to 1:15 RT, 4/2 12:10 COVID test GV prior to 1:15 RT, 4/5 9:15 RT prior to 10:30 arrival [facility] IR for PEG placement.  I noted I will provide her with Epic appt calendar tomorrow.  She voiced understanding of information provided.  Gayleen Orem, RN, BSN Head & Neck Oncology Nurse [place] at [place] [phone]

## 2019-10-11 NOTE — Progress Notes (Signed)
patient ID: Rhonda Marshall, female   DOB: June 11, 1951, 69 y.o.   MRN: [id number]                                                                                                                Reason for Appointment:  Hyperthyroidism, follow-up  Today's office visit was provided via telemedicine using a telephone call to the patient Patient has been explained the limitations of evaluation and management by telemedicine and the availability of in person appointments.  The patient understood the limitations and agreed to proceed. Patient also understood that the telehealth visit is billable. . Location of the patient: Home . Location of the provider: Office Only the patient and myself were participating in the encounter   Chief complaint: Follow-up for overactive thyroid   History of Present Illness:   Review of her records for thyroid History indicates that she reportedly had subclinical hyperthyroidism in 2012 when she was admitted for new onset atrial fibrillation Subsequently did not have any treatment or further evaluation She has been treated for atrial fibrillation by her cardiologist and was recommended evaluation for the thyroid recently  The patient is a poor historian and on her initial visit she did not give any symptoms of palpitations, shakiness, feeling excessively warm and sweaty or weight loss.  She did complain of nervousness and fatigue.   However she has also had issues with anxiety and depression  Since her baseline TSH was very low along with upper normal free T3 she has been on PTU 50 mg twice a day since October 2017 Baseline thyrotropin receptor antibody was negative With starting treatment she had less fatigue symptoms  She was scheduled for I-131 treatment because of the finding of a large hot nodule on the left side  However she could not do this since she was not able to be in isolation for a couple of days at home   RECENT history:   In 03/2017 when  she was off her antithyroid drugs she was not having any symptoms like palpitations, however she was having some fatigue and shortness of breath and was having more problems with her tachycardia Her free T3 was up above normal at 4.3 at the same time  She has been treated with PTU 50 mg, the dose ranging from 1 up to 2 a day  On her visit in 2/20 she was having relatively higher heart rate and her TSH was low normal at 0.3 She was told to increase her PTU to twice a day but she forgot and still takes it once a day  Subsequently thyroid levels have been previously normal   Recently not complaining of palpitations, shortness of breath or significant weight change She is having some weakness and fatigue related to treatment for her glottic malignant neoplasm Also has difficulty swallowing but she is trying to take her PTU by cutting it in 4 pieces  Her thyroid levels are indicating a high free T3 level along with slightly low TSH now   Wt Readings  from Last 3 Encounters:  09/02/19 134 lb 4.2 oz (60.9 kg)  05/06/19 135 lb 6.4 oz (61.4 kg)  09/09/18 139 lb 6.4 oz (63.2 kg)      Thyroid function tests as follows:     Lab Results  Component Value Date   FREET4 0.82 10/05/2019   FREET4 0.84 07/05/2019   FREET4 0.87 02/21/2019   T3FREE 4.8 (H) 10/05/2019   T3FREE 4.1 07/05/2019   T3FREE 3.6 02/21/2019   TSH 0.26 (L) 10/05/2019   TSH 0.44 07/05/2019   TSH 0.67 02/21/2019    Lab Results  Component Value Date   THYROTRECAB <0.50 12/24/2016     Allergies as of 10/11/2019      Reactions   Clindamycin/lincomycin Rash   Latex Rash   Latex gloves      Medication List       Accurate as of October 11, 2019  9:31 AM. If you have any questions, ask your nurse or doctor.        ALPRAZolam 1 MG tablet Commonly known as: XANAX Take 1 mg by mouth 3 (three) times daily as needed for anxiety.   diltiazem 120 MG 24 hr capsule Commonly known as: CARDIZEM CD Take 1 capsule (120 mg  total) by mouth daily. OFFICE VISIT NEEDED   Eliquis 5 MG Tabs tablet Generic drug: apixaban Take 1 tablet (5 mg total) by mouth 2 (two) times daily. Appointment needed with cardiologist for further refills   lidocaine 2 % solution Commonly known as: XYLOCAINE Patient: Mix 1part 2% viscous lidocaine, 1part H20. Swallow 40mL of diluted mixture, 37min before meals and at bedtime, up to QID   metoprolol tartrate 100 MG tablet Commonly known as: LOPRESSOR Take 2 tablets (200 mg total) by mouth 2 (two) times daily.   oxyCODONE-acetaminophen 5-325 MG tablet Commonly known as: PERCOCET/ROXICET Take 1 tablet by mouth every 4 (four) hours as needed for severe pain.   propylthiouracil 50 MG tablet Commonly known as: PTU Take 1 tablet (50 mg total) by mouth daily. TAKE 1 TABLET BY MOUTH ONCE DAILY.           Past Medical History:  Diagnosis Date  . Anxiety   . Atrial fibrillation ([place])    newly diagnosed 10/2010  . Family history of adverse reaction to anesthesia    Sister's BP drops   . Hypertension   . Rheumatoid arthritis(714.0)   . Subclinical hyperthyroidism     Past Surgical History:  Procedure Laterality Date  . Arm surgery     fracture, right arm  . CARDIOVERSION N/A 08/10/2014   Procedure: CARDIOVERSION;  Surgeon: Dorothy Spark, MD;  Location: [facility];  Service: Cardiovascular;  Laterality: N/A;  . CATARACT EXTRACTION    . DIRECT LARYNGOSCOPY N/A 09/02/2019   Procedure: DIRECT LARYNGOSCOPY WITH BIOPSY;  Surgeon: Rozetta Nunnery, MD;  Location: [facility];  Service: ENT;  Laterality: N/A;  . TUBAL LIGATION      Family History  Problem Relation Age of Onset  . Heart attack Mother 26       pacemaker age 20  . Hypothyroidism Mother   . Cancer Father        colon and prostate  . Diabetes Brother   . CAD Brother 48  . Cancer Brother        Renal  . Hypothyroidism Sister     Social History:  reports that she has been smoking  cigarettes. She started smoking about 54 years ago. She has  a 13.50 pack-year smoking history. She has never used smokeless tobacco. She reports previous alcohol use. She reports that she does not use drugs.  Allergies:  Allergies  Allergen Reactions  . Clindamycin/Lincomycin Rash  . Latex Rash    Latex gloves     Review of Systems  She has chronic atrial fibrillation followed by cardiologist  She is on metoprolol 200 mg twice daily for heart rate control followed by cardiologist   Examination:   There were no vitals taken for this visit.     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 Because of difficulty doing I-131 treatment she is on 50 mg PTU once a day for several months  Currently dealing with the malignant neoplasm of her glottis Although she thinks she is taking her PTU regularly every day she is having difficulty swallowing tablets   TSH is slightly low and free T3 is above normal now Although she should ideally do her I-131 treatment she is currently in the process of getting her radiation treatment for the cancer  She will now increase her PTU to twice daily Discussed that she can crush this and take it with applesauce Otherwise needs to follow-up in 6 weeks and at that time consider scheduling for I-131 treatment  Elayne Snare 10/11/2019, 9:31 AM   Total duration of telephone encounter = 6 minutes  Note: This office note was prepared with Dragon voice recognition system technology. Any transcriptional errors that result from this process are unintentional.

## 2019-10-11 NOTE — Patient Instructions (Signed)

## 2019-10-11 NOTE — Progress Notes (Signed)
Patient to have labs collected on 10/17/19 before PEG tube placement at [facility] and 10/20/19 to monitor CMP, magnesium, and phospherous levels (per Joli Allen-RD patient is at risk for refeeding syndrome with recent weight loss).   Lab orders placed as standing orders and lab appointments made.

## 2019-10-11 NOTE — Progress Notes (Addendum)
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 Past medical history of anxiety, HTN, RA, hyperthyroidism. Patient receiving radiation therapy.  Planning PEG placement.    Met with patient during infusion of IV fluids in [facility].    Patient has not shown up for past nutrition visits.   Patient seen with sister Lelon Frohlich at chairside.  Patient reports unable to eat and drink much since Saturday (3/27).  Patient reports painful swallowing and thick mucus.      Medications: lidocaine  Labs: creatinine 1.34  Anthropometrics:   Height: 61 inches Weight: 130 lb. 8 oz yesterday in [facility] UBW: 138-139 lb per sister.  Noted in 09/02/2019 134 lb  BMI: 25  Noted 9 lb weight loss in 1 weeks per radiation note.   6% weight loss in 1 week, significant   Estimated Energy Needs  Kcals: 1770-2000 Protein: 89-100 g Fluid: 2 L  NUTRITION DIAGNOSIS: Inadequate oral intake related to cancer related treatment side effects as evidenced by 6% weight loss in 1 week and unable to eat orally   INTERVENTION:  Recommend osmolite 1.5, 5 cartons per day via feeding tube (4 feedings).  Flush with 86m of water before and after each feeding.  Recommend prosource 343mBID for additional protein.  Recommend starting at 1/2 carton QID and increasing by 1/2 carton daily until goal rate reached.  Written tube feeding regimen instructions provided to patient.  Reviewed regimen with patient and patient practiced giving water through sample feeding tube.  Patient will need reinforcement with tube feeding instructions.   Tube feeding regimen will provide 1975 calories, 95 g protein and 1980 ml free water.   Patient is at risk of refeeding syndrome. Recommend checking CMET, Mag and Phosphorus during titration of tube feeding. Message sent to MD.  Provided patient with RD contact information.     MONITORING, EVALUATION, GOAL: Patient will utilize feeding tube and oral intake to meet nutritional  needs during treatment   NEXT VISIT: Monday, April 5th with BaErnestene KielJoli B. AlZenia ResidesRDTempeLDElmsfordegistered Dietitian [phone])

## 2019-10-12 ENCOUNTER — Ambulatory Visit
Admission: RE | Admit: 2019-10-12 | Discharge: 2019-10-12 | Disposition: A | Discharge status: Home / Self Care | Payer: Medicare Other | Source: Ambulatory Visit | Attending: Radiation Oncology | Admitting: Radiation Oncology

## 2019-10-12 ENCOUNTER — Other Ambulatory Visit: Payer: Self-pay

## 2019-10-12 ENCOUNTER — Telehealth: Payer: Self-pay | Admitting: Cardiology

## 2019-10-12 DIAGNOSIS — Z510 Encounter for antineoplastic radiation therapy: Secondary | ICD-10-CM | POA: Diagnosis not present

## 2019-10-12 NOTE — Telephone Encounter (Signed)
Contacted sister back- her and patient was advised of message that okay to crush pills. Patient sister verbalized understanding.

## 2019-10-12 NOTE — Telephone Encounter (Signed)
New message   Patient's sister wants to know if the heart medication that patient is taking can be crushed. Please call to discuss.

## 2019-10-12 NOTE — Progress Notes (Signed)
Oncology Nurse Navigator Documentation  Met with       to provide PEG/port education prior to                 placement.  Provided port educational handout, showed example, provided guidance for post-surgical dsg removal, site care.  . Using  PEG teaching device   and Teach Back, provided education for PEG use and care, including: hand hygiene, gravity bolus administration of daily water flushes and nutritional supplement, fluids and medications; care of tube insertion site including daily dressing change and cleaning; S&S of infection.   . Ms. Piccini correctly verbalized procedures for and provided correct return demonstration of gravity administration of water, dressing change and site care.  . I provided written instructions for PEG flushing/dressing change in support of verbal instruction.   . I provided/described contents of Start of Care Bolus Feeding Kit (3 60 cc syringes, 2 boxes 4x4 drainage sponges, 1 package mesh briefs, 1 roll paper tape, 1 case Osmolite 1.5).  He voiced understanding he is to start using Osmolite per guidance of Nutrition. . She understands I will be available for ongoing PEG support. Provided barium sulfate prep which I obtained from [facility] IR, reviewed instructions which included guidance for 10/14/19 COVID screening at [facility].    Harlow Asa RN, BSN, OCN,  Head and Neck Navigator, [place]

## 2019-10-12 NOTE — Telephone Encounter (Signed)
Okay to crush Eliquis and metoprolol tartrate (new Rx from 3/29).   Metoprolol succinate (old Rx) should not be crushed and was discontinued to allow crushing

## 2019-10-12 NOTE — Telephone Encounter (Signed)
Will route to PharmD to advise.  Thank you!

## 2019-10-13 ENCOUNTER — Inpatient Hospital Stay: Payer: Medicare Other | Attending: Radiation Oncology

## 2019-10-13 ENCOUNTER — Other Ambulatory Visit (HOSPITAL_COMMUNITY): Payer: Medicare Other

## 2019-10-13 ENCOUNTER — Ambulatory Visit: Payer: Medicare Other

## 2019-10-13 ENCOUNTER — Other Ambulatory Visit: Payer: Self-pay

## 2019-10-13 ENCOUNTER — Ambulatory Visit
Admission: RE | Admit: 2019-10-13 | Discharge: 2019-10-13 | Disposition: A | Discharge status: Home / Self Care | Payer: Medicare Other | Source: Ambulatory Visit | Attending: Radiation Oncology | Admitting: Radiation Oncology

## 2019-10-13 VITALS — BP 114/87 | HR 104 | Temp 98.3°F | Resp 20

## 2019-10-13 DIAGNOSIS — C320 Malignant neoplasm of glottis: Secondary | ICD-10-CM | POA: Insufficient documentation

## 2019-10-13 DIAGNOSIS — C329 Malignant neoplasm of larynx, unspecified: Secondary | ICD-10-CM | POA: Insufficient documentation

## 2019-10-13 DIAGNOSIS — Z510 Encounter for antineoplastic radiation therapy: Secondary | ICD-10-CM | POA: Diagnosis present

## 2019-10-13 DIAGNOSIS — E860 Dehydration: Secondary | ICD-10-CM

## 2019-10-13 MED ORDER — SODIUM CHLORIDE 0.9 % IV SOLN
Freq: Once | INTRAVENOUS | Status: AC
  Filled 2019-10-13: qty 250

## 2019-10-13 NOTE — Patient Instructions (Signed)

## 2019-10-14 ENCOUNTER — Other Ambulatory Visit: Payer: Self-pay

## 2019-10-14 ENCOUNTER — Other Ambulatory Visit: Payer: Self-pay | Admitting: Radiology

## 2019-10-14 ENCOUNTER — Ambulatory Visit (HOSPITAL_COMMUNITY)
Admission: RE | Admit: 2019-10-14 | Discharge: 2019-10-14 | Disposition: A | Discharge status: Home / Self Care | Payer: Medicare Other | Source: Ambulatory Visit | Attending: Radiation Oncology | Admitting: Radiation Oncology

## 2019-10-14 ENCOUNTER — Ambulatory Visit (HOSPITAL_COMMUNITY): Payer: Medicare Other

## 2019-10-14 ENCOUNTER — Telehealth: Payer: Self-pay | Admitting: *Deleted

## 2019-10-14 ENCOUNTER — Other Ambulatory Visit (HOSPITAL_COMMUNITY)
Admission: RE | Admit: 2019-10-14 | Discharge: 2019-10-14 | Disposition: A | Discharge status: Home / Self Care | Payer: Medicare Other | Source: Ambulatory Visit | Attending: Radiation Oncology | Admitting: Radiation Oncology

## 2019-10-14 ENCOUNTER — Ambulatory Visit
Admission: RE | Admit: 2019-10-14 | Discharge: 2019-10-14 | Disposition: A | Discharge status: Home / Self Care | Payer: Medicare Other | Source: Ambulatory Visit | Attending: Radiation Oncology | Admitting: Radiation Oncology

## 2019-10-14 ENCOUNTER — Other Ambulatory Visit: Payer: Self-pay | Admitting: Radiation Oncology

## 2019-10-14 DIAGNOSIS — R1310 Dysphagia, unspecified: Secondary | ICD-10-CM | POA: Diagnosis not present

## 2019-10-14 DIAGNOSIS — I700 Atherosclerosis of aorta: Secondary | ICD-10-CM | POA: Diagnosis not present

## 2019-10-14 DIAGNOSIS — Z01818 Encounter for other preprocedural examination: Secondary | ICD-10-CM | POA: Insufficient documentation

## 2019-10-14 DIAGNOSIS — I2510 Atherosclerotic heart disease of native coronary artery without angina pectoris: Secondary | ICD-10-CM | POA: Insufficient documentation

## 2019-10-14 DIAGNOSIS — C320 Malignant neoplasm of glottis: Secondary | ICD-10-CM

## 2019-10-14 DIAGNOSIS — Z20822 Contact with and (suspected) exposure to covid-19: Secondary | ICD-10-CM | POA: Insufficient documentation

## 2019-10-14 LAB — SARS CORONAVIRUS 2 (TAT 6-24 HRS): SARS Coronavirus 2: NEGATIVE

## 2019-10-14 NOTE — Telephone Encounter (Signed)
CALLED PATIENT TO INFORM OF CT FOR TODAY  AFTER RT, SPOKE WITH PATIENT AND SHE IS AWARE OF THIS CT AND SHE IS AWARE THAT THERE ARE NO RESTRICTIONS

## 2019-10-15 ENCOUNTER — Inpatient Hospital Stay: Payer: Medicare Other

## 2019-10-15 ENCOUNTER — Other Ambulatory Visit: Payer: Self-pay

## 2019-10-15 VITALS — BP 135/66 | HR 75 | Temp 98.3°F | Resp 22

## 2019-10-15 DIAGNOSIS — E860 Dehydration: Secondary | ICD-10-CM

## 2019-10-15 DIAGNOSIS — C329 Malignant neoplasm of larynx, unspecified: Secondary | ICD-10-CM | POA: Diagnosis not present

## 2019-10-15 MED ORDER — SODIUM CHLORIDE 0.9 % IV SOLN
Freq: Once | INTRAVENOUS | Status: AC
  Filled 2019-10-15: qty 250

## 2019-10-15 MED ORDER — HEPARIN SOD (PORK) LOCK FLUSH 100 UNIT/ML IV SOLN
500.0000 [IU] | Freq: Once | INTRAVENOUS | Status: DC
  Filled 2019-10-15: qty 5

## 2019-10-15 NOTE — Patient Instructions (Signed)

## 2019-10-17 ENCOUNTER — Ambulatory Visit
Admission: RE | Admit: 2019-10-17 | Discharge: 2019-10-17 | Disposition: A | Discharge status: Home / Self Care | Payer: Medicare Other | Source: Ambulatory Visit | Attending: Radiation Oncology | Admitting: Radiation Oncology

## 2019-10-17 ENCOUNTER — Ambulatory Visit (HOSPITAL_COMMUNITY)
Admission: RE | Admit: 2019-10-17 | Discharge: 2019-10-17 | Disposition: A | Discharge status: Home / Self Care | Payer: Medicare Other | Source: Ambulatory Visit | Attending: Radiation Oncology | Admitting: Radiation Oncology

## 2019-10-17 ENCOUNTER — Inpatient Hospital Stay: Payer: Medicare Other | Admitting: Nutrition

## 2019-10-17 ENCOUNTER — Inpatient Hospital Stay: Payer: Medicare Other

## 2019-10-17 ENCOUNTER — Encounter (HOSPITAL_COMMUNITY): Payer: Self-pay

## 2019-10-17 ENCOUNTER — Other Ambulatory Visit: Payer: Self-pay

## 2019-10-17 VITALS — BP 92/75 | HR 58 | Temp 98.7°F | Resp 17

## 2019-10-17 DIAGNOSIS — C320 Malignant neoplasm of glottis: Secondary | ICD-10-CM | POA: Insufficient documentation

## 2019-10-17 DIAGNOSIS — Z79899 Other long term (current) drug therapy: Secondary | ICD-10-CM | POA: Diagnosis not present

## 2019-10-17 DIAGNOSIS — M069 Rheumatoid arthritis, unspecified: Secondary | ICD-10-CM | POA: Diagnosis not present

## 2019-10-17 DIAGNOSIS — E0590 Thyrotoxicosis, unspecified without thyrotoxic crisis or storm: Secondary | ICD-10-CM | POA: Diagnosis not present

## 2019-10-17 DIAGNOSIS — F419 Anxiety disorder, unspecified: Secondary | ICD-10-CM | POA: Insufficient documentation

## 2019-10-17 DIAGNOSIS — Z923 Personal history of irradiation: Secondary | ICD-10-CM | POA: Insufficient documentation

## 2019-10-17 DIAGNOSIS — F17210 Nicotine dependence, cigarettes, uncomplicated: Secondary | ICD-10-CM | POA: Insufficient documentation

## 2019-10-17 DIAGNOSIS — I4891 Unspecified atrial fibrillation: Secondary | ICD-10-CM | POA: Diagnosis not present

## 2019-10-17 DIAGNOSIS — R1310 Dysphagia, unspecified: Secondary | ICD-10-CM | POA: Diagnosis not present

## 2019-10-17 DIAGNOSIS — E860 Dehydration: Secondary | ICD-10-CM

## 2019-10-17 DIAGNOSIS — I10 Essential (primary) hypertension: Secondary | ICD-10-CM | POA: Diagnosis not present

## 2019-10-17 DIAGNOSIS — Z7901 Long term (current) use of anticoagulants: Secondary | ICD-10-CM | POA: Diagnosis not present

## 2019-10-17 DIAGNOSIS — C329 Malignant neoplasm of larynx, unspecified: Secondary | ICD-10-CM | POA: Diagnosis not present

## 2019-10-17 HISTORY — PX: IR GASTROSTOMY TUBE MOD SED: IMG625

## 2019-10-17 LAB — CMP (CANCER CENTER ONLY)
ALT: 17 U/L (ref 0–44)
AST: 20 U/L (ref 15–41)
Albumin: 3.7 g/dL (ref 3.5–5.0)
Alkaline Phosphatase: 84 U/L (ref 38–126)
Anion gap: 11 (ref 5–15)
BUN: 10 mg/dL (ref 8–23)
CO2: 23 mmol/L (ref 22–32)
Calcium: 9.5 mg/dL (ref 8.9–10.3)
Chloride: 109 mmol/L (ref 98–111)
Creatinine: 0.96 mg/dL (ref 0.44–1.00)
GFR, Est AFR Am: >60 mL/min (ref >60)
GFR, Estimated: 60 mL/min — ABNORMAL LOW (ref >60)
Glucose, Bld: 96 mg/dL (ref 70–99)
Potassium: 4.1 mmol/L (ref 3.5–5.1)
Sodium: 143 mmol/L (ref 135–145)
Total Bilirubin: 0.6 mg/dL (ref 0.3–1.2)
Total Protein: 7 g/dL (ref 6.5–8.1)

## 2019-10-17 LAB — CBC
HCT: 33.7 % — ABNORMAL LOW (ref 36.0–46.0)
Hemoglobin: 11.2 g/dL — ABNORMAL LOW (ref 12.0–15.0)
MCH: 33 pg (ref 26.0–34.0)
MCHC: 33.2 g/dL (ref 30.0–36.0)
MCV: 99.4 fL (ref 80.0–100.0)
Platelets: 194 10*3/uL (ref 150–400)
RBC: 3.39 MIL/uL — ABNORMAL LOW (ref 3.87–5.11)
RDW: 13.3 % (ref 11.5–15.5)
WBC: 6.4 10*3/uL (ref 4.0–10.5)
nRBC: 0 % (ref 0.0–0.2)

## 2019-10-17 LAB — PROTIME-INR
INR: 1.1 (ref 0.8–1.2)
Prothrombin Time: 14 seconds (ref 11.4–15.2)

## 2019-10-17 LAB — PHOSPHORUS: Phosphorus: 3.7 mg/dL (ref 2.5–4.6)

## 2019-10-17 LAB — MAGNESIUM: Magnesium: 1.8 mg/dL (ref 1.7–2.4)

## 2019-10-17 MED ORDER — SODIUM CHLORIDE 0.9 % IV SOLN: INTRAVENOUS | Status: DC

## 2019-10-17 MED ORDER — KETOROLAC TROMETHAMINE 30 MG/ML IJ SOLN
INTRAMUSCULAR | Status: AC
  Filled 2019-10-17: qty 1

## 2019-10-17 MED ORDER — LIDOCAINE HCL 1 % IJ SOLN
INTRAMUSCULAR | Status: AC
  Filled 2019-10-17: qty 20

## 2019-10-17 MED ORDER — MIDAZOLAM HCL 2 MG/2ML IJ SOLN
INTRAMUSCULAR | Status: AC | PRN
  Administered 2019-10-17: 1 mg via INTRAVENOUS

## 2019-10-17 MED ORDER — CEFAZOLIN SODIUM-DEXTROSE 2-4 GM/100ML-% IV SOLN
2.0000 g | INTRAVENOUS | Status: AC
  Administered 2019-10-17: 2 g via INTRAVENOUS

## 2019-10-17 MED ORDER — SODIUM CHLORIDE 0.9 % IV SOLN
Freq: Once | INTRAVENOUS | Status: AC
  Filled 2019-10-17: qty 250

## 2019-10-17 MED ORDER — FENTANYL CITRATE (PF) 100 MCG/2ML IJ SOLN
INTRAMUSCULAR | Status: AC | PRN
  Administered 2019-10-17 (×2): 50 ug via INTRAVENOUS

## 2019-10-17 MED ORDER — IOHEXOL 300 MG/ML  SOLN
50.0000 mL | Freq: Once | INTRAMUSCULAR | Status: AC | PRN
  Administered 2019-10-17: 15 mL

## 2019-10-17 MED ORDER — MIDAZOLAM HCL 2 MG/2ML IJ SOLN
INTRAMUSCULAR | Status: AC
  Filled 2019-10-17: qty 2

## 2019-10-17 MED ORDER — BACITRACIN-NEOMYCIN-POLYMYXIN 400-5-5000 EX OINT
1.0000 "application " | TOPICAL_OINTMENT | Freq: Every day | CUTANEOUS | Status: DC
  Filled 2019-10-17: qty 1

## 2019-10-17 MED ORDER — LIDOCAINE HCL 1 % IJ SOLN
INTRAMUSCULAR | Status: AC | PRN
  Administered 2019-10-17: 10 mL

## 2019-10-17 MED ORDER — KETOROLAC TROMETHAMINE 30 MG/ML IJ SOLN
INTRAMUSCULAR | Status: AC | PRN
  Administered 2019-10-17: 30 mg via INTRAVENOUS

## 2019-10-17 MED ORDER — CEFAZOLIN SODIUM-DEXTROSE 2-4 GM/100ML-% IV SOLN
INTRAVENOUS | Status: AC
  Filled 2019-10-17: qty 100

## 2019-10-17 MED ORDER — OSMOLITE 1.5 CAL PO LIQD: ORAL | 0 refills | Status: DC

## 2019-10-17 MED ORDER — FENTANYL CITRATE (PF) 100 MCG/2ML IJ SOLN
INTRAMUSCULAR | Status: AC
  Filled 2019-10-17: qty 2

## 2019-10-17 NOTE — Discharge Instructions (Addendum)
Gastrostomy Tube Home Guide, Adult A gastrostomy tube, or G-tube, is a tube that is inserted through the abdomen into the stomach. The tube is used to give feedings and medicines when a person is unable to eat and drink enough on his or her own. How to care for a G-tube Supplies needed  Saline solution or clean, warm water and soap.  Cotton swab or gauze.  Precut gauze bandage (dressing) and tape, if needed. Instructions 1. Wash your hands with soap and water. 2. If there is a dressing between the person's skin and the tube, remove it. 3. Check the area where the tube enters the skin. Check for problems such as: ? Redness. ? Swelling. ? Pus-like drainage. ? Extra skin growth. 4. Moisten the cotton swab with the saline solution or soap and water mixture. Gently clean around the insertion site. Remove any drainage or crusting. ? When the G-tube is first put in, a normal saline solution or water can be used to clean the skin. ? Mild soap and warm water can be used when the skin around the G-tube site has healed. 5. If there should be a dressing between the person's skin and the tube, apply it at this time. How to flush a G-tube Flush the G-tube regularly to keep it from clogging. Flush it before and after feedings and as often as told by the health care provider. Supplies needed  Purified or sterile water, warmed. If the person has a weak disease-fighting (immune) system, or if he or she has difficulty fighting off infections (is immunocompromised), use only sterile water. ? If you are unsure about the amount of chemical contaminants in purified or drinking water, use sterile water. ? To purify drinking water by boiling:  Boil water for at least 1 minute. Keep lid over water while it boils. Allow water to cool to room temperature before using.  60cc G-tube syringe. Instructions 1. Wash your hands with soap and water. 2. Draw up 30 mL of warm water in a syringe. 3. Connect the syringe  to the tube. 4. Slowly and gently push the water into the tube. G-tube problems and solutions  If the tube comes out: ? Cover the opening with a clean dressing and tape. ? Call a health care provider right away. ? A health care provider will need to put the tube back in within 4 hours.  If there is skin or scar tissue growing where the tube enters the skin: ? Keep the area clean and dry. ? Secure the tube with tape so that the tube does not move around too much. ? Call a health care provider.  If the tube gets clogged: ? Slowly push warm water into the tube with a large syringe. ? Do not force the fluid into the tube or push an object into the tube. ? If you are not able to unclog the tube, call a health care provider right away. Follow these instructions at home: Feedings  Give feedings at room temperature.  Cover and place unused feedings in the refrigerator.  If feedings are continuous: ? Do not put more than 4 hours worth of feedings in the feeding bag. ? Stop the feedings when you need to give medicine or flush the tube. Be sure to restart the feedings. ? Make sure the person's head is above his or her stomach (upright position). This will prevent choking and discomfort.  Replace feeding bags and syringes as told by the health care provider.  Make  sure the person is in the right position during and after feedings: ? During feedings, the person's position should be in the upright position. ? After a noncontinuous feeding (bolus feeding), have the person stay in the upright position for 1 hour. General instructions  Only use syringes made for G-tubes.  Do not pull or put tension on the tube.  Clamp the tube before removing the cap or disconnecting a syringe.  Measure the length of the G-tube every day from the insertion site to the end of the tube.  If the person's G-tube has a balloon, check the fluid in the balloon every week. The amount of fluid that should be in  the balloon can be found in the manufacturer's specifications.  Make sure the person takes care of his or her oral health, such as by brushing his or her teeth.  Remove excess air from the G-tube as told by the person's health care provider. This is called "venting."  Keep the area where the tube enters the skin clean and dry.  Do not push feedings, medicines, or flushes rapidly. Contact a health care provider if:  The person with the tube has any of these problems: ? Constipation. ? Fever.  There is a large amount of fluid or mucus-like liquid leaking from the tube.  Skin or scar tissue appears to be growing where the tube enters the skin.  The length of tube from the insertion site to the G-tube gets longer. Get help right away if:  The person with the tube has any of these problems: ? Severe abdominal pain. ? Severe tenderness. ? Severe bloating. ? Nausea. ? Vomiting. ? Trouble breathing. ? Shortness of breath.  Any of these problems happen in the area where the tube enters the skin: ? Redness, irritation, swelling, or soreness. ? Pus-like discharge. ? A bad smell.  The tube is clogged and cannot be flushed.  The tube comes out. Summary  A gastrostomy tube, or G-tube, is a tube that is inserted through the abdomen into the stomach. The tube is used to give feedings and medicines when a person is unable to eat and drink enough on his or her own.  Check and clean the insertion site daily as told by the person's health care provider.  Flush the G-tube regularly to keep it from clogging. Flush it before and after feedings and as often as told by the person's health care provider.  Keep the area where the tube enters the skin clean and dry. This information is not intended to replace advice given to you by your health care provider. Make sure you discuss any questions you have with your health care provider. Document Revised: 06/12/2017 Document Reviewed:  08/25/2016 Elsevier Patient Education  2020 Elsevier Inc. Moderate Conscious Sedation, Adult Sedation is the use of medicines to promote relaxation and relieve discomfort and anxiety. Moderate conscious sedation is a type of sedation. Under moderate conscious sedation, you are less alert than normal, but you are still able to respond to instructions, touch, or both. Moderate conscious sedation is used during short medical and dental procedures. It is milder than deep sedation, which is a type of sedation under which you cannot be easily woken up. It is also milder than general anesthesia, which is the use of medicines to make you unconscious. Moderate conscious sedation allows you to return to your regular activities sooner. Tell a health care provider about:  Any allergies you have.  All medicines you are taking, including   vitamins, herbs, eye drops, creams, and over-the-counter medicines.  Use of steroids (by mouth or creams).  Any problems you or family members have had with sedatives and anesthetic medicines.  Any blood disorders you have.  Any surgeries you have had.  Any medical conditions you have, such as sleep apnea.  Whether you are pregnant or may be pregnant.  Any use of cigarettes, alcohol, marijuana, or street drugs. What are the risks? Generally, this is a safe procedure. However, problems may occur, including:  Getting too much medicine (oversedation).  Nausea.  Allergic reaction to medicines.  Trouble breathing. If this happens, a breathing tube may be used to help with breathing. It will be removed when you are awake and breathing on your own.  Heart trouble.  Lung trouble. What happens before the procedure? Staying hydrated Follow instructions from your health care provider about hydration, which may include:  Up to 2 hours before the procedure - you may continue to drink clear liquids, such as water, clear fruit juice, black coffee, and plain  tea. Eating and drinking restrictions Follow instructions from your health care provider about eating and drinking, which may include:  8 hours before the procedure - stop eating heavy meals or foods such as meat, fried foods, or fatty foods.  6 hours before the procedure - stop eating light meals or foods, such as toast or cereal.  6 hours before the procedure - stop drinking milk or drinks that contain milk.  2 hours before the procedure - stop drinking clear liquids. Medicine Ask your health care provider about:  Changing or stopping your regular medicines. This is especially important if you are taking diabetes medicines or blood thinners.  Taking medicines such as aspirin and ibuprofen. These medicines can thin your blood. Do not take these medicines before your procedure if your health care provider instructs you not to.  Tests and exams  You will have a physical exam.  You may have blood tests done to show: ? How well your kidneys and liver are working. ? How well your blood can clot. General instructions  Plan to have someone take you home from the hospital or clinic.  If you will be going home right after the procedure, plan to have someone with you for 24 hours. What happens during the procedure?  An IV tube will be inserted into one of your veins.  Medicine to help you relax (sedative) will be given through the IV tube.  The medical or dental procedure will be performed. What happens after the procedure?  Your blood pressure, heart rate, breathing rate, and blood oxygen level will be monitored often until the medicines you were given have worn off.  Do not drive for 24 hours. This information is not intended to replace advice given to you by your health care provider. Make sure you discuss any questions you have with your health care provider. Document Revised: 06/12/2017 Document Reviewed: 10/20/2015 Elsevier Patient Education  2020 Elsevier Inc.  

## 2019-10-17 NOTE — Progress Notes (Signed)
Discharge instructions reviewed with patient and sister. Patient has appointment with nutritionalist at [facility] to receive all her supplies for her new G-Tube. Verbalized understanding.

## 2019-10-17 NOTE — H&P (Signed)
Chief Complaint: Patient was seen in consultation today for percutaneous gastric tube placement at the request of [place]  Referring Physician(s): Eppie Gibson  Supervising Physician: Corrie Mckusick  Patient Status: [facility] - Out-pt  History of Present Illness: Rhonda Marshall is a 69 y.o. 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 Anxiety   . Atrial fibrillation ([place])    newly diagnosed 10/2010  . Family history of adverse reaction to anesthesia    Sister's BP drops   . Hypertension   . Rheumatoid arthritis(714.0)   . Subclinical hyperthyroidism     Past Surgical History:  Procedure Laterality Date  . Arm surgery     fracture, right arm  . CARDIOVERSION N/A 08/10/2014   Procedure: CARDIOVERSION;  Surgeon: Dorothy Spark, MD;  Location: [place];  Service: Cardiovascular;  Laterality: N/A;  . CATARACT EXTRACTION    . DIRECT LARYNGOSCOPY N/A 09/02/2019   Procedure: DIRECT LARYNGOSCOPY WITH BIOPSY;  Surgeon: Rozetta Nunnery, MD;  Location: [facility];  Service: ENT;  Laterality: N/A;  . TUBAL LIGATION      Allergies: Clindamycin/lincomycin and Latex  Medications: Prior to Admission medications   Medication Sig Start Date End Date Taking? Authorizing Provider  ALPRAZolam Duanne Moron) 1 MG tablet Take 1 mg by mouth 3 (three) times daily as needed for anxiety.  05/11/14  Yes [provider]  diltiazem (CARDIZEM CD) 120 MG 24 hr capsule Take 1 capsule (120 mg total) by mouth daily. OFFICE VISIT NEEDED 06/22/19 10/12/19 Yes Minus Breeding, MD  ELIQUIS 5 MG TABS tablet Take 1 tablet (5 mg  total) by mouth 2 (two) times daily. Appointment needed with cardiologist for further refills Patient taking differently: Take 5 mg by mouth 2 (two) times daily.  09/26/19  Yes Minus Breeding, MD  lidocaine (XYLOCAINE) 2 % solution Patient: Mix 1part 2% viscous lidocaine, 1part H20. Swallow 96mL of diluted mixture, 87min before meals and at bedtime, up to QID Patient taking differently: Use as directed 10 mLs in the mouth or throat See admin instructions. Patient: Mix 1part 2% viscous lidocaine, 1part H20. Swallow 72mL of diluted mixture, 79min before meals and at bedtime, up to 4 times daily as needed for throat pain 09/26/19  Yes Eppie Gibson, MD  metoprolol tartrate (LOPRESSOR) 100 MG tablet Take 2 tablets (200 mg total) by mouth 2 (two) times daily. 10/10/19  Yes Minus Breeding, MD  oxyCODONE-acetaminophen (PERCOCET) 10-325 MG tablet Take 1 tablet by mouth 4 (four) times daily as needed for pain.   Yes [provider]  propylthiouracil (PTU) 50 MG tablet Take 1 tablet (50 mg total) by mouth daily. TAKE 1 TABLET BY MOUTH ONCE DAILY. Patient taking differently: Take 50 mg by mouth daily.  06/29/19  Yes Elayne Snare, MD  Multiple Vitamin (MULTIVITAMIN WITH MINERALS) TABS tablet Take 1 tablet by mouth daily.    [provider]     Family History  Problem Relation Age of Onset  . Heart attack Mother 22       pacemaker age 37  . Hypothyroidism Mother   . Cancer Father        colon and prostate  .  Diabetes Brother   . CAD Brother 62  . Cancer Brother        Renal  . Hypothyroidism Sister     Social History   Socioeconomic History  . Marital status: Widowed    Spouse name: Not on file  . Number of children: 1  . Years of education: Not on file  . Highest education level: Not on file  Occupational History  . Occupation: laborer    Comment: Warehouse manager  Tobacco Use  . Smoking status: Current Every Day Smoker    Packs/day: 0.25    Years: 54.00    Pack years:  13.50    Types: Cigarettes    Start date: 57  . Smokeless tobacco: Never Used  . Tobacco comment: She has almost quit completely. She is taking  a "few puffs daily" -09/09/19  Substance and Sexual Activity  . Alcohol use: Not Currently    Alcohol/week: 0.0 standard drinks  . Drug use: No  . Sexual activity: Not on file  Other Topics Concern  . Not on file  Social History Narrative  . Not on file   Social Determinants of Health   Financial Resource Strain:   . Difficulty of Paying Living Expenses:   Food Insecurity:   . Worried About Charity fundraiser in the Last Year:   . Arboriculturist in the Last Year:   Transportation Needs:   . Film/video editor (Medical):   Marland Kitchen Lack of Transportation (Non-Medical):   Physical Activity:   . Days of Exercise per Week:   . Minutes of Exercise per Session:   Stress:   . Feeling of Stress :   Social Connections:   . Frequency of Communication with Friends and Family:   . Frequency of Social Gatherings with Friends and Family:   . Attends Religious Services:   . Active Member of Clubs or Organizations:   . Attends Archivist Meetings:   Marland Kitchen Marital Status:     Review of Systems: A 12 point ROS discussed and pertinent positives are indicated in the HPI above.  All other systems are negative.  Review of Systems  Constitutional: Positive for activity change, appetite change, fatigue and unexpected weight change. Negative for fever.  HENT: Positive for sore throat and trouble swallowing.   Respiratory: Negative for shortness of breath.   Cardiovascular: Negative for chest pain.  Gastrointestinal: Negative for abdominal pain.  Musculoskeletal: Negative for back pain.  Neurological: Negative for weakness.  Psychiatric/Behavioral: Negative for behavioral problems and confusion.    Vital Signs: BP (!) 112/93   Pulse 70   Resp 14   Ht 5\' 1"  (1.549 m)   Wt 127 lb (57.6 kg)   SpO2 97%   BMI 24.00 kg/m   Physical  Exam Vitals reviewed.  HENT:     Mouth/Throat:     Mouth: Mucous membranes are moist.  Neck:     Comments: Neck skin is red from radiation Cardiovascular:     Rate and Rhythm: Normal rate and regular rhythm.     Heart sounds: Normal heart sounds.  Pulmonary:     Breath sounds: Normal breath sounds.  Abdominal:     Palpations: Abdomen is soft.  Musculoskeletal:        General: Normal range of motion.  Skin:    General: Skin is warm and dry.  Neurological:     Mental Status: She is alert and oriented to person, place, and time.  Psychiatric:  Behavior: Behavior normal.        Thought Content: Thought content normal.        Judgment: Judgment normal.     Imaging: CT Abdomen Wo Contrast  Result Date: 10/14/2019 CLINICAL DATA:  Esophageal cancer, dysphagia, assess GI anatomy prior to PEG tube placement EXAM: CT ABDOMEN WITHOUT CONTRAST TECHNIQUE: Multidetector CT imaging of the abdomen was performed following the standard protocol without IV contrast. COMPARISON:  None. FINDINGS: Lower chest: Coronary artery calcifications. Hepatobiliary: No focal liver abnormality is seen. No gallstones, gallbladder wall thickening, or biliary dilatation. Pancreas: Unremarkable. No pancreatic ductal dilatation or surrounding inflammatory changes. Spleen: Normal in size without focal abnormality. Adrenals/Urinary Tract: Adrenal glands are unremarkable. Kidneys are normal, without renal calculi, focal lesion, or hydronephrosis. Stomach/Bowel: Stomach is within normal limits. No evidence of bowel wall thickening, distention, or inflammatory changes. Vascular/Lymphatic: Aortic atherosclerosis. No enlarged abdominal lymph nodes. Other: No abdominal wall hernia or abnormality. Musculoskeletal: No acute or significant osseous findings. IMPRESSION: 1. No anatomic abnormality of the abdomen with respect to percutaneous gastrostomy placement. The transverse colon lies in the expected location inferior to the  gastric body at this time. 2.  Coronary artery disease.  Aortic Atherosclerosis (ICD10-I70.0). Electronically Signed   By: Eddie Candle M.D.   On: 10/14/2019 14:23    Labs:  CBC: Recent Labs    05/06/19 1632  WBC 12.8*  HGB 14.6  HCT 41.4  PLT 301    COAGS: No results for input(s): INR, APTT in the last 8760 hours.  BMP: Recent Labs    10/10/19 1441 10/17/19 0935  NA 141 143  K 4.7 4.1  CL 104 109  CO2 26 23  GLUCOSE 99 96  BUN 12 10  CALCIUM 10.2 9.5  CREATININE 1.34* 0.96  GFRNONAA 40* 60*  GFRAA 46* >60    LIVER FUNCTION TESTS: Recent Labs    07/05/19 1352 10/17/19 0935  BILITOT  --  0.6  AST  --  20  ALT 13 17  ALKPHOS  --  84  PROT  --  7.0  ALBUMIN  --  3.7    TUMOR MARKERS: No results for input(s): AFPTM, CEA, CA199, CHROMGRNA in the last 8760 hours.  Assessment and Plan:  Throat cancer Radiation has started Poor intake; wt loss Dysphagia Scheduled for percutaneous gastric tube placement Risks and benefits image guided gastrostomy tube placement was discussed with the patient including, but not limited to the need for a barium enema during the procedure, bleeding, infection, peritonitis and/or damage to adjacent structures.  All of the patient's questions were answered, patient is agreeable to proceed. Consent signed and in chart.   Thank you for this interesting consult.  I greatly enjoyed meeting Rhonda Marshall and look forward to participating in their care.  A copy of this report was sent to the requesting provider on this date.  Electronically Signed: Lavonia Drafts, PA-C 10/17/2019, 11:27 AM   I spent a total of  30 Minutes   in face to face in clinical consultation, greater than 50% of which was counseling/coordinating care for perc G tube placement

## 2019-10-17 NOTE — Patient Instructions (Signed)

## 2019-10-17 NOTE — Procedures (Signed)
Interventional Radiology Procedure Note  Procedure: Placement of percutaneous 20F pull-through gastrostomy tube. Complications: None Recommendations: - NPO except for sips and chips remainder of today and overnight - Maintain G-tube to LWS until tomorrow morning  - May advance diet as tolerated and begin using tube tomorrow morning  Signed,   Lamarion Mcevers S. Taheerah Guldin, DO   

## 2019-10-17 NOTE — Progress Notes (Signed)
Nutrition follow-up completed with patient during IV fluids.  Patient is status post PEG placement today.  She is receiving radiation therapy for [place] cancer.  She reports she has been unable to eat or drink much. Weight decreased and documented as 127 pounds on April 5.  Estimated nutrition needs: 1770-2000 cal, 89-100 g protein,2.0 L fluid.  Nutrition diagnosis:  Inadequate oral intake continues.  Intervention: Reviewed tube feeding regimen with patient again today. Patient reports she is comfortable to begin one half bottle of Osmolite 1.5 tomorrow morning at 8 AM per her tube feeding directions.  She will increase Osmolite 1.5 x 1/2 bottle daily until she achieves tolerance of 5 bottles daily.  Once tube feeding tolerance is established she will add 30 mL of Prosource twice daily.  She is flushing feeding tube with 90 mL of free water before and after bolus feeding. She has no questions or concerns. Tube feeding orders were written and [facility] was contacted.  Goal: 5 bottles Osmolite 1.5+30 mL Prosource twice daily plus free water flushes provides 1975 cal, 95 g protein, 1980 mL free water.  Monitoring, evaluation, goals: Patient will tolerate adequate calories and protein to minimize weight loss.  Next visit: Monday, April 12 by telephone.  Patient has my contact information for questions.  **Disclaimer: This note was dictated with voice recognition software. Similar sounding words can inadvertently be transcribed and this note may contain transcription errors which may not have been corrected upon publication of note.**

## 2019-10-18 ENCOUNTER — Ambulatory Visit
Admission: RE | Admit: 2019-10-18 | Discharge: 2019-10-18 | Disposition: A | Discharge status: Home / Self Care | Payer: Medicare Other | Source: Ambulatory Visit | Attending: Radiation Oncology | Admitting: Radiation Oncology

## 2019-10-18 ENCOUNTER — Other Ambulatory Visit: Payer: Self-pay

## 2019-10-18 ENCOUNTER — Other Ambulatory Visit: Payer: Self-pay | Admitting: Cardiology

## 2019-10-18 ENCOUNTER — Ambulatory Visit (HOSPITAL_COMMUNITY): Payer: Medicare Other

## 2019-10-18 DIAGNOSIS — C320 Malignant neoplasm of glottis: Secondary | ICD-10-CM

## 2019-10-18 MED ORDER — SONAFINE EX EMUL
1.0000 "application " | Freq: Two times a day (BID) | CUTANEOUS | Status: DC
  Administered 2019-10-18: 1 via TOPICAL

## 2019-10-18 NOTE — Progress Notes (Signed)
Oncology Nurse Navigator Documentation  I met with patient after her radiation and weekly MD visit today. She had concerns and questions about her PEG that was placed yesterday. I answered all questions asked and then assisted her to change the dressing to her PEG site. I reinforced my previous education on the use of cotton swabs to clean around the site, and how to slip the dressing underneath her PEG and signs of infection.  She verbalized understanding and demonstrated the use of the cotton swab for PEG site cleaning. The PEG site was clean, and dry without any signs of infection. Ms. Gavina knows how to contact me if she has any further questions or concerns.   Harlow Asa RN, BSN, OCN Head & Neck Oncology Nurse [place] at [facility] Phone # [phone]  Fax # [phone]

## 2019-10-19 ENCOUNTER — Ambulatory Visit
Admission: RE | Admit: 2019-10-19 | Discharge: 2019-10-19 | Disposition: A | Discharge status: Home / Self Care | Payer: Medicare Other | Source: Ambulatory Visit | Attending: Radiation Oncology | Admitting: Radiation Oncology

## 2019-10-19 ENCOUNTER — Inpatient Hospital Stay: Payer: Medicare Other

## 2019-10-19 ENCOUNTER — Other Ambulatory Visit: Payer: Self-pay

## 2019-10-19 VITALS — BP 115/70 | HR 77 | Temp 97.8°F | Resp 18

## 2019-10-19 DIAGNOSIS — C329 Malignant neoplasm of larynx, unspecified: Secondary | ICD-10-CM | POA: Diagnosis not present

## 2019-10-19 DIAGNOSIS — E860 Dehydration: Secondary | ICD-10-CM

## 2019-10-19 DIAGNOSIS — C320 Malignant neoplasm of glottis: Secondary | ICD-10-CM | POA: Diagnosis not present

## 2019-10-19 MED ORDER — SODIUM CHLORIDE 0.9 % IV SOLN
Freq: Once | INTRAVENOUS | Status: AC
  Filled 2019-10-19: qty 250

## 2019-10-19 NOTE — Patient Instructions (Signed)

## 2019-10-20 ENCOUNTER — Ambulatory Visit
Admission: RE | Admit: 2019-10-20 | Discharge: 2019-10-20 | Disposition: A | Discharge status: Home / Self Care | Payer: Medicare Other | Source: Ambulatory Visit | Attending: Radiation Oncology | Admitting: Radiation Oncology

## 2019-10-20 DIAGNOSIS — C320 Malignant neoplasm of glottis: Secondary | ICD-10-CM

## 2019-10-20 LAB — PHOSPHORUS: Phosphorus: 3 mg/dL (ref 2.5–4.6)

## 2019-10-20 LAB — CMP (CANCER CENTER ONLY)
ALT: 13 U/L (ref 0–44)
AST: 17 U/L (ref 15–41)
Albumin: 3.5 g/dL (ref 3.5–5.0)
Alkaline Phosphatase: 99 U/L (ref 38–126)
Anion gap: 8 (ref 5–15)
BUN: 11 mg/dL (ref 8–23)
CO2: 27 mmol/L (ref 22–32)
Calcium: 9.5 mg/dL (ref 8.9–10.3)
Chloride: 107 mmol/L (ref 98–111)
Creatinine: 0.92 mg/dL (ref 0.44–1.00)
GFR, Est AFR Am: >60 mL/min (ref >60)
GFR, Estimated: >60 mL/min (ref >60)
Glucose, Bld: 132 mg/dL — ABNORMAL HIGH (ref 70–99)
Potassium: 4.3 mmol/L (ref 3.5–5.1)
Sodium: 142 mmol/L (ref 135–145)
Total Bilirubin: 0.4 mg/dL (ref 0.3–1.2)
Total Protein: 6.9 g/dL (ref 6.5–8.1)

## 2019-10-20 LAB — MAGNESIUM: Magnesium: 1.9 mg/dL (ref 1.7–2.4)

## 2019-10-21 ENCOUNTER — Ambulatory Visit
Admission: RE | Admit: 2019-10-21 | Discharge: 2019-10-21 | Disposition: A | Discharge status: Home / Self Care | Payer: Medicare Other | Source: Ambulatory Visit | Attending: Radiation Oncology | Admitting: Radiation Oncology

## 2019-10-21 ENCOUNTER — Other Ambulatory Visit: Payer: Self-pay

## 2019-10-21 DIAGNOSIS — C320 Malignant neoplasm of glottis: Secondary | ICD-10-CM | POA: Diagnosis not present

## 2019-10-24 ENCOUNTER — Other Ambulatory Visit: Payer: Self-pay

## 2019-10-24 ENCOUNTER — Other Ambulatory Visit: Payer: Self-pay | Admitting: Radiation Oncology

## 2019-10-24 ENCOUNTER — Ambulatory Visit
Admission: RE | Admit: 2019-10-24 | Discharge: 2019-10-24 | Disposition: A | Discharge status: Home / Self Care | Payer: Medicare Other | Source: Ambulatory Visit | Attending: Radiation Oncology | Admitting: Radiation Oncology

## 2019-10-24 ENCOUNTER — Inpatient Hospital Stay: Payer: Medicare Other | Admitting: Nutrition

## 2019-10-24 DIAGNOSIS — C320 Malignant neoplasm of glottis: Secondary | ICD-10-CM | POA: Diagnosis not present

## 2019-10-24 MED ORDER — GABAPENTIN 250 MG/5ML PO SOLN: 300.0000 mg | Freq: Three times a day (TID) | ORAL | 0 refills | Status: DC

## 2019-10-24 NOTE — Progress Notes (Signed)
Nutrition follow-up completed with patient after radiation treatment. Patient is receiving radiation therapy for [place] cancer. Weight was documented as 129.1 pounds on April 12 per patient. She reports she is drinking about 16 ounces of water and 16 ounces of warm tea every day. She is only receiving 1 bottle Osmolite 1.5 twice a day.  She is relying on her family to help her with feedings.  They are not available to give additional feedings as needed. Patient denies nausea, vomiting, constipation, diarrhea.  Nutrition diagnosis: Inadequate oral intake continues.  Estimated nutrition needs: 1770-2000 cal, 89-100 g protein, 2.0 L fluid.  Intervention: Assisted patient with administering tube feeding today so that she could give her self feeding and not have to rely on family. Patient reports she has not had any nutrition since 8 AM this morning. Patient noted to have a full stomach after giving 2 ounces of Osmolite 1.5.  We did not try to give any more Osmolite 1.5 but did flush her feeding tube with 60 mL of water. Patient was able to demonstrate proper tube feeding administration. I asked patient to try giving 1 bottle Osmolite 1.5 tomorrow at 8 AM, 2:00 PM 6:00 PM and 10:00 p.m.  I cautioned patient not to eat or drink for 1 hour 1 to 2 hours prior to tube feeding.  Will need to evaluate tolerance.  Patient may need Reglan for digestion.  Monitoring, evaluation, goals: Patient will tolerate tube feeding at goal rate to minimize weight loss.  Next visit: Monday, April 19.  **Disclaimer: This note was dictated with voice recognition software. Similar sounding words can inadvertently be transcribed and this note may contain transcription errors which may not have been corrected upon publication of note.**

## 2019-10-25 ENCOUNTER — Ambulatory Visit
Admission: RE | Admit: 2019-10-25 | Discharge: 2019-10-25 | Disposition: A | Discharge status: Home / Self Care | Payer: Medicare Other | Source: Ambulatory Visit | Attending: Radiation Oncology | Admitting: Radiation Oncology

## 2019-10-25 ENCOUNTER — Other Ambulatory Visit: Payer: Self-pay

## 2019-10-25 DIAGNOSIS — C320 Malignant neoplasm of glottis: Secondary | ICD-10-CM | POA: Diagnosis not present

## 2019-10-26 ENCOUNTER — Ambulatory Visit
Admission: RE | Admit: 2019-10-26 | Discharge: 2019-10-26 | Disposition: A | Discharge status: Home / Self Care | Payer: Medicare Other | Source: Ambulatory Visit | Attending: Radiation Oncology | Admitting: Radiation Oncology

## 2019-10-26 DIAGNOSIS — C320 Malignant neoplasm of glottis: Secondary | ICD-10-CM | POA: Diagnosis not present

## 2019-10-26 NOTE — Progress Notes (Signed)
Oncology Nurse Navigator Documentation  Spoke with Rhonda Marshall and [name] to coordinate her attendance at this Thursday morning's [facility] [place] to meet with SLP Garald Balding.  Provided 8:55 appt for radiation and 9:30 with Glendell Docker encouraged early arrival, provided registration instructions.  They voiced understanding.  Harlow Asa RN, BSN, OCN Head & Neck Oncology Nurse [place] at [facility] Phone # [phone]  Fax # [phone]

## 2019-10-27 ENCOUNTER — Ambulatory Visit
Admission: RE | Admit: 2019-10-27 | Discharge: 2019-10-27 | Disposition: A | Discharge status: Home / Self Care | Payer: Medicare Other | Source: Ambulatory Visit | Attending: Radiation Oncology | Admitting: Radiation Oncology

## 2019-10-27 ENCOUNTER — Ambulatory Visit: Payer: Medicare Other | Attending: Radiation Oncology

## 2019-10-27 ENCOUNTER — Ambulatory Visit: Payer: Medicare Other

## 2019-10-27 DIAGNOSIS — R490 Dysphonia: Secondary | ICD-10-CM | POA: Insufficient documentation

## 2019-10-27 DIAGNOSIS — R1310 Dysphagia, unspecified: Secondary | ICD-10-CM | POA: Diagnosis present

## 2019-10-27 DIAGNOSIS — C320 Malignant neoplasm of glottis: Secondary | ICD-10-CM | POA: Diagnosis not present

## 2019-10-27 NOTE — Progress Notes (Signed)
Oncology Nurse Navigator Documentation  Met with patient and daughter-in-law Rhonda Marshall during [facility]. Patient arrived at a different time than scheduled but Glendell Docker was able to fit her in the schedule in the afternoon. Rhonda Marshall had several concerns regarding use of the PEG tube vs. Consuming foods orally. Rhonda Marshall feels like she is eating "mashed up" foods and drinking without difficulty. I encouraged oral intake if able, and she should determine the amount of tube feedings given based on her oral intake including protein and calories daily. She will speak with Dory Peru RD on Monday regarding her expected intake needed for healing during/after radiation. Ms. Craigo was then escorted to [facility] for her radiation treatment today. I gave Rhonda Marshall my card, and she knows that she can call me anytime with questions/or concerns.

## 2019-10-27 NOTE — Therapy (Signed)
[facility] [address] [place], [place], [address] Phone: [phone]   Fax:  [phone]  Speech Language Pathology Treatment  Patient Details  Name: Rhonda Marshall MRN: [id number] Date of Birth: 1950/10/26 Referring Provider (SLP): Eppie [place], MD   Encounter Date: 10/27/2019  End of Session - 10/27/19 1504    Visit Number  2    Number of Visits  3    Date for SLP Re-Evaluation  12/28/19    SLP Start Time  [id number]    SLP Stop Time   1423    SLP Time Calculation (min)  38 min    Activity Tolerance  Patient tolerated treatment well       Past Medical History:  Diagnosis Date  . Anxiety   . Atrial fibrillation ([place])    newly diagnosed 10/2010  . Family history of adverse reaction to anesthesia    Sister's BP drops   . Hypertension   . Rheumatoid arthritis(714.0)   . Subclinical hyperthyroidism     Past Surgical History:  Procedure Laterality Date  . Arm surgery     fracture, right arm  . CARDIOVERSION N/A 08/10/2014   Procedure: CARDIOVERSION;  Surgeon: Dorothy Spark, MD;  Location: [place] [place];  Service: Cardiovascular;  Laterality: N/A;  . CATARACT EXTRACTION    . DIRECT LARYNGOSCOPY N/A 09/02/2019   Procedure: DIRECT LARYNGOSCOPY WITH BIOPSY;  Surgeon: Rozetta Nunnery, MD;  Location: [facility];  Service: ENT;  Laterality: N/A;  . IR GASTROSTOMY TUBE MOD SED  10/17/2019  . TUBAL LIGATION      There were no vitals filed for this visit.  Subjective Assessment - 10/27/19 1330    Subjective  Pt with PEG placed early April 2021 due to weight loss.    Patient is accompained by:  Family member   PAtricia - DIL           ADULT SLP TREATMENT - 10/27/19 1336      General Information   Behavior/Cognition  Alert;Cooperative;Pleasant mood      Treatment Provided   Treatment provided  Dysphagia      Dysphagia Treatment   Temperature Spikes Noted  No    Respiratory Status   Room air    Oral Cavity - Dentition  Adequate natural dentition    Treatment Methods  Patient/caregiver education;Therapeutic exercise;Skilled observation    Patient observed directly with PO's  Yes    Type of PO's observed  Thin liquids;Dysphagia 1 (puree)    Oral Phase Signs & Symptoms  --   none noted   Pharyngeal Phase Signs & Symptoms  --   none noted   Other treatment/comments  Pt aphonic. She reported she has been eating some dysI-III foods, with Ensures. No coughing/strngling reported by pt nor by daughter in law. Pt completed HEP with occasional min A from daughter in law , who provided pt with appropriate cues for accurate completioin of HEP. Daughter in law reports assisting pt occasionallyl/usually during the week with HEP. Pt told SLP rationale for HEP with total A.       Assessment / Recommendations / Plan   Plan  Continue with current plan of care      Progression Toward Goals   Progression toward goals  Progressing toward goals       SLP Education - 10/27/19 1507    Education Details  s/sx aspiration PNA    Person(s) Educated  Patient;Caregiver(s)    Methods  Explanation;Handout  Comprehension  Verbalized understanding;Need further instruction       SLP Short Term Goals - 10/27/19 1506      SLP SHORT TERM GOAL #1   Title  Pt will complete HEP with occasional min A    Period  --   sessions, for all STGs   Status  Achieved      SLP SHORT TERM GOAL #2   Title  Pt will tell SLP rationale for HEP completion, 10 minutes after pt given initial max cues    Status  Achieved      SLP SHORT TERM GOAL #3   Title  Pt will tell SLP how a food journal can hasten/facilitate return to more normalized diet with occasional min A    Time  1    Status  On-going      SLP SHORT TERM GOAL #4   Title  pt will tell SLP 2 overt s/sx aspiration PNA with modified independence    Status  Achieved       SLP Long Term Goals - 10/27/19 1507      SLP LONG TERM GOAL #1   Title   Pt will complete HEP with rare min A over 3 visits    Time  4    Period  --   sessions, for all LTGs   Status  On-going      SLP LONG TERM GOAL #2   Title  Pt will tell SLP when to decr frequency of HEP with min A, after 10 minute delay from initial cue    Time  5    Status  On-going       Plan - 10/27/19 1505    Clinical Impression Statement  Pt presents today with WNL/WFL swallowing ability with applesauce and water. No overt s/sx aspiration PNA reported or observed today. Data suggests that as pts progress through rad or chemorad therapy that their swallowing ability will decrease. 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 Therefore, SLP reviewed pt's indivdualized exercise program to mitigate/eliminate pt's muscle fibrosis following and as a result of, pt's rad tx. Skilled ST would cont to be beneficial to the pt in order to regularly assess pt's safety with POs and/or need for instrumental swallow assessment, as well as to assess accurate completion of swallowing HEP.    Speech Therapy Frequency  --   once approx every 4 weeks   Duration  --   6 sessions (7 total sessions)   Treatment/Interventions  Aspiration precaution training;Pharyngeal strengthening exercises;Diet toleration management by SLP;Trials of upgraded texture/liquids;Internal/external aids;Patient/family education;SLP instruction and feedback;Compensatory strategies    Potential to Achieve Goals  Good    Potential Considerations  Ability to learn/carryover information    SLP Home Exercise Plan  provided today    Consulted and Agree with Plan of Care  Patient       Patient will benefit from skilled therapeutic intervention in order to improve the following deficits and impairments:   Dysphagia, unspecified type  Hoarseness    Problem List Patient Active Problem List   Diagnosis Date Noted  . Dehydration 10/10/2019  . 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 Leg swelling 05/05/2019  . Hyperthyroidism 12/01/2016  . Dyspnea on exertion 07/30/2015  . NICM (nonischemic cardiomyopathy) ([place]) 07/30/2015  . Right knee pain 05/31/2015  . Rheumatoid arthritis ([place]) 07/21/2014  . Smoker 07/21/2014  . Anticoagulation adequate 06/05/2014  . A-fib ([place]) 11/14/2010  .  Reflux 11/14/2010    [facility] ,[place], CCC-SLP  10/27/2019, 3:08 PM  [facility] [address] [place], [place], [address] Phone: [phone]   Fax:  [phone]   Name: Rhonda Marshall MRN: [id number] Date of Birth: [birth year]

## 2019-10-28 ENCOUNTER — Ambulatory Visit
Admission: RE | Admit: 2019-10-28 | Discharge: 2019-10-28 | Disposition: A | Discharge status: Home / Self Care | Payer: Medicare Other | Source: Ambulatory Visit | Attending: Radiation Oncology | Admitting: Radiation Oncology

## 2019-10-28 DIAGNOSIS — C320 Malignant neoplasm of glottis: Secondary | ICD-10-CM | POA: Diagnosis not present

## 2019-10-31 ENCOUNTER — Encounter: Payer: Self-pay | Admitting: Radiation Oncology

## 2019-10-31 ENCOUNTER — Ambulatory Visit
Admission: RE | Admit: 2019-10-31 | Discharge: 2019-10-31 | Disposition: A | Discharge status: Home / Self Care | Payer: Medicare Other | Source: Ambulatory Visit | Attending: Radiation Oncology | Admitting: Radiation Oncology

## 2019-10-31 ENCOUNTER — Ambulatory Visit: Payer: Medicare Other

## 2019-10-31 ENCOUNTER — Other Ambulatory Visit: Payer: Self-pay

## 2019-10-31 ENCOUNTER — Inpatient Hospital Stay: Payer: Medicare Other | Admitting: Nutrition

## 2019-10-31 DIAGNOSIS — C320 Malignant neoplasm of glottis: Secondary | ICD-10-CM | POA: Diagnosis not present

## 2019-10-31 MED ORDER — SILVER SULFADIAZINE 1 % EX CREA: TOPICAL_CREAM | Freq: Two times a day (BID) | CUTANEOUS | Status: DC

## 2019-10-31 NOTE — Progress Notes (Signed)
Oncology Nurse Navigator Documentation  Met with Rhonda Marshall after final RT to offer support and to celebrate end of radiation treatment.   Provided verbal/written post-RT guidance:  Importance of keeping all follow-up appts, especially those with Nutrition and SLP.  Importance of protecting treatment area from sun.  Continuation of Sonafine application 2-3 times daily, application of antibiotic ointment to areas of raw skin; when supply of Sonafine exhausted transition to OTC lotion with vitamin E. Provided/reviewed Epic calendar of upcoming appts. Explained my role as navigator will continue for several more months, encouraged her to call me with needs/concerns.     Harlow Asa RN, BSN, OCN Head & Neck Oncology Nurse [place] at [facility] Phone # [phone]  Fax # [phone]

## 2019-10-31 NOTE — Progress Notes (Signed)
Nutrition follow-up completed with patient over the telephone. 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 Weight was documented as 127 pounds. Patient is trying to eat very soft foods and drink some liquids. I am unable to determine exactly how much tube feeding patient's family is giving her.  Patient indicated she is only getting tube feeding twice a day but she is not sure how much her family is infusing. Patient states she is going to try to do it on her own from now on.  Nutrition diagnosis: Inadequate oral intake continues.  Intervention: Educated patient to increase Osmolite 1.5-1 bottle 4 times daily with 60 mL of water before and after each bolus feeding. Reminded patient to only eat and drink halfway between feedings. Encourage patient to contact me if she had any issues with her tube feeding.  Monitoring, evaluation, goals: Patient will tolerate adequate calories and protein to minimize weight loss.  Next visit: We will call patient on the telephone on Friday, May 7.  **Disclaimer: This note was dictated with voice recognition software. Similar sounding words can inadvertently be transcribed and this note may contain transcription errors which may not have been corrected upon publication of note.**

## 2019-11-01 ENCOUNTER — Ambulatory Visit: Payer: Medicare Other

## 2019-11-17 NOTE — Progress Notes (Signed)
Ms. Kwiat presents today for 2 week follow-up after completing radiation to the larynx on 10/31/2019  Pain issues, if any: Sore throat is slowly improving. She recently get her voice back earlier this week, and can swallow more comfortably Using a feeding tube?: Yes. Her daughter-in-law helps her use about 2 times a day Weight changes, if any:  Wt Readings from Last 3 Encounters:  11/18/19 123 lb 2 oz (55.8 kg)  10/17/19 127 lb (57.6 kg)  10/11/19 129 lb 9.6 oz (58.8 kg)   Swallowing issues, if any: Improving, but she continues to mainly consume liquid nutrition (either soups/Ensure, or through PEG tube) Smoking or chewing tobacco? None Using fluoride trays daily? N/A Last ENT visit was on: She has not seen Dr. [name] since biopsy in February, but was told she would follow-up with him once her throat was more healed. Other notable issues, if any: Her main complaint is diarrhea for the past day and a half. She has not tried anything OTC (Immodium or probiotics) and is scared to try to eat anything more solid because she's worried it will make her have to go to the bathroom more.  Vitals:   11/18/19 1351  BP: 110/77  Pulse: 84  Resp: 20  Temp: 98.9 F (37.2 C)  SpO2: 98%

## 2019-11-18 ENCOUNTER — Encounter: Payer: Self-pay | Admitting: Radiation Oncology

## 2019-11-18 ENCOUNTER — Inpatient Hospital Stay: Payer: Medicare Other | Attending: Radiation Oncology | Admitting: Nutrition

## 2019-11-18 ENCOUNTER — Ambulatory Visit
Admission: RE | Admit: 2019-11-18 | Discharge: 2019-11-18 | Disposition: A | Discharge status: Home / Self Care | Payer: Medicare Other | Source: Ambulatory Visit | Attending: Radiation Oncology | Admitting: Radiation Oncology

## 2019-11-18 ENCOUNTER — Other Ambulatory Visit: Payer: Self-pay

## 2019-11-18 VITALS — BP 110/77 | HR 84 | Temp 98.9°F | Resp 20 | Wt 123.1 lb

## 2019-11-18 DIAGNOSIS — C320 Malignant neoplasm of glottis: Secondary | ICD-10-CM | POA: Diagnosis present

## 2019-11-18 DIAGNOSIS — Z923 Personal history of irradiation: Secondary | ICD-10-CM | POA: Diagnosis not present

## 2019-11-18 NOTE — Progress Notes (Signed)
I attempted to contact patient by telephone at home number. She was not available. Left my name and phone number for return call.

## 2019-11-18 NOTE — Progress Notes (Addendum)
Oncology Nurse Navigator Documentation  I met with Rhonda Marshall and her sister during her 2 week post radiation follow up with Dr. Isidore Moos for continued support.  I have contacted Dory Peru RD and asked her to contact patient for suggestions on how to help with her current diarrhea that she has been experiencing for the past few days.  I contacted Dr. Pollie Friar office for a 2 month follow up with him at the request of Dr. Isidore Moos. They plan to call her with the new appointment.   Ms. Drenning know to look for a call from [facility] and Dr. Pollie Friar office regarding the above. She knows that she can call me anytime for any further questions or concerns.   Harlow Asa RN, BSN, OCN Head & Neck Oncology Nurse [place] at [facility] Phone # [phone]  Fax # [phone]

## 2019-11-21 ENCOUNTER — Telehealth: Payer: Self-pay | Admitting: Nutrition

## 2019-11-21 ENCOUNTER — Encounter: Payer: Medicare Other | Admitting: Nutrition

## 2019-11-21 NOTE — Telephone Encounter (Addendum)
Telephone follow up completed with patient over the phone. Reports diarrhea is improved using Imodium.  She reports she did not answer her phone last week because she could not speak.  She reports she did not feel well last week.  She is tolerating very small amounts of oral intake such as yogurt or oatmeal.  She reports her daughter is infusing 1 bottle Osmolite 1.5 with 60 mL of free water before and after her bolus feeding usually 1 time a day.  She sometimes gives her 2 bottles per day.  Noted weight decreased and was documented as 123 pounds on May 7 down from 127 pounds. Patient is unable to give herself TF and must rely on family.  Nutrition diagnosis of inadequate oral intake continues.  Intervention: I educated patient on the importance of increasing oral intake and TF to minimize further weight loss. Reviewed soft foods and encouraged her to try something by mouth every few hours. Encouraged her to ask family to give 1.5 bottles of Osmolite 1.5 2 times daily with 60 mL free water before and after each bolus feeding. If tolerated, she could try 2 bottles BID.  Offered to switch patient to continuous feeding but she refused. Encouraged patient to try to drink oral nutrition supplements by mouth, even in small amounts throughout the day. Questions answered and teach back used.   Monitoring, Evaluation, Goals: Patient will tolerate increased calories and protein to minimize further weight loss.  Next Visit: Will add a phone visit over the next two weeks.

## 2019-11-22 ENCOUNTER — Encounter: Payer: Self-pay | Admitting: Radiation Oncology

## 2019-11-22 NOTE — Progress Notes (Unsigned)
Telephone visits scheduled on 5/25 and 6/8.

## 2019-11-22 NOTE — Progress Notes (Signed)
Radiation Oncology         [id number]) [phone] ________________________________  Name: Rhonda Marshall MRN: [id number]  Date: 11/18/2019  DOB: June 09, 1951  Follow-Up Visit Note  CC: Timoteo Gaul, FNP  [name] E, *  Diagnosis and Prior Radiotherapy:       ICD-10-CM   1. Malignant neoplasm of glottis ([place])  C32.0     CHIEF COMPLAINT:  Here for follow-up and surveillance of glottic cancer  Narrative:  The patient returns today for routine follow-up - she completed RT in mid April. (4/19). Her throat is improving.  It is less sore.  Her voice is returning.  She still using her feeding tube.  She is swallowing more comfortably.  She has lost about 4 pounds in the past month.    She is not smoking or chewing tobacco.  She has had diarrhea over the past day and a half.  No known Covid exposures.  She has not yet been vaccinated.  She reports that she stays in her room all the time at home.                ALLERGIES:  is allergic to clindamycin/lincomycin and latex.  Meds: Current Outpatient Medications  Medication Sig Dispense Refill  . apixaban (ELIQUIS) 5 MG TABS tablet Take 1 tablet (5 mg total) by mouth 2 (two) times daily. 180 tablet 0  . gabapentin (NEURONTIN) 250 MG/5ML solution Take 6 mLs (300 mg total) by mouth 3 (three) times daily. 470 mL 0  . metoprolol tartrate (LOPRESSOR) 100 MG tablet Take 2 tablets (200 mg total) by mouth 2 (two) times daily. 120 tablet 5  . Nutritional Supplements (FEEDING SUPPLEMENT, OSMOLITE 1.5 CAL,) LIQD Give 1/2 bottle Osmolite 1.5 QID via PEG with 90 mL free water before and after bolus feedings. Increase by 1/2 carton Osmolite 1.5 daily until goal rate of 5 bottles daily. Give 30 mL ProSource or equivalent mixed with 30 mL water BID via PEG. Flush with 60 mL water. 1185 mL 0  . ALPRAZolam (XANAX) 1 MG tablet Take 1 mg by mouth 3 (three) times daily as needed for anxiety.   0  . diltiazem (CARDIZEM CD) 120 MG 24 hr capsule Take 1 capsule (120 mg  total) by mouth daily. OFFICE VISIT NEEDED 45 capsule 9  . lidocaine (XYLOCAINE) 2 % solution Patient: Mix 1part 2% viscous lidocaine, 1part H20. Swallow 69mL of diluted mixture, 41min before meals and at bedtime, up to QID (Patient taking differently: Use as directed 10 mLs in the mouth or throat See admin instructions. Patient: Mix 1part 2% viscous lidocaine, 1part H20. Swallow 32mL of diluted mixture, 48min before meals and at bedtime, up to 4 times daily as needed for throat pain) 200 mL 3  . Multiple Vitamin (MULTIVITAMIN WITH MINERALS) TABS tablet Take 1 tablet by mouth daily.    Marland Kitchen oxyCODONE-acetaminophen (PERCOCET) 10-325 MG tablet Take 1 tablet by mouth 4 (four) times daily as needed for pain.    Marland Kitchen propylthiouracil (PTU) 50 MG tablet Take 1 tablet (50 mg total) by mouth daily. TAKE 1 TABLET BY MOUTH ONCE DAILY. (Patient taking differently: Take 50 mg by mouth daily. ) 60 tablet 3   No current facility-administered medications for this encounter.    Physical Findings: The patient is in no acute distress. Patient is alert and oriented. Wt Readings from Last 3 Encounters:  11/18/19 123 lb 2 oz (55.8 kg)  10/17/19 127 lb (57.6 kg)  10/11/19 129 lb 9.6 oz (58.8  kg)    weight is 123 lb 2 oz (55.8 kg). Her temporal temperature is 98.9 F (37.2 C). Her blood pressure is 110/77 and her pulse is 84. Her respiration is 20 and oxygen saturation is 98%. .  General: Alert and oriented, in no acute distress HEENT: Head is normocephalic. Extraocular movements are intact. Oropharynx is notable for no lesions Neck: Neck is notable for healing skin Skin: Skin in treatment fields shows satisfactory healing, intact Abdomen: PEG site clean, intact Psychiatric: Judgment and insight are intact. Affect is appropriate.   Lab Findings: Lab Results  Component Value Date   WBC 6.4 10/17/2019   HGB 11.2 (L) 10/17/2019   HCT 33.7 (L) 10/17/2019   MCV 99.4 10/17/2019   PLT 194 10/17/2019    Lab Results    Component Value Date   TSH 0.26 (L) 10/05/2019    Radiographic Findings: No results found.  Impression/Plan: This is a delightful woman with a history of glottic cancer, status post radiation therapy.  1) Head and Neck Cancer Status: Healing from radiotherapy  2) Nutritional Status: She missed her appointment with our nutritionist.  We are trying to reschedule that for her.  She may need to change her supplements in case they are contributing to her diarrhea.  She also knows that she should try Imodium A-D as needed. PEG tube: Continue to use as needed  3) Risk Factors: The patient has been educated about risk factors including alcohol and tobacco abuse; they understand that avoidance of alcohol and tobacco is important to prevent recurrences as well as other cancers  4) Swallowing: Functional, continue speech-language pathology.  5)  Thyroid function: Check annually Lab Results  Component Value Date   TSH 0.26 (L) 10/05/2019   Free T4 was 0.84 in Dec 2020  6)  Follow-up in October.  Follow-up with Dr. Lucia Gaskins in July for laryngoscopy.  The patient was encouraged to call with any issues or questions before then.  We discussed measures to reduce the risk of infection during the COVID-19 pandemic.  At length, we discussed the COVID-19 vaccine.  We discussed why the benefits of the vaccine far outweigh the risks.  We discussed the potential risks of COVID-19.  All questions were answered to her satisfaction.  She remains hesitant to receive the vaccine right now.  She states that she will consider it when she is recovered further from her treatment.  Her family member, who is present today, knows how to schedule her when she is ready.  On date of service, in total, I spent 25 minutes with this patient.  She was seen in person.   _____________________________________   Eppie [place], MD

## 2019-11-23 ENCOUNTER — Ambulatory Visit: Payer: Medicare Other

## 2019-11-25 NOTE — Progress Notes (Signed)
  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  Plan: The patient will follow-up with radiation oncology in 2 weeks.  -----------------------------------  Eppie [place], MD

## 2019-11-29 ENCOUNTER — Telehealth: Payer: Self-pay | Admitting: Dietician

## 2019-11-29 ENCOUNTER — Inpatient Hospital Stay: Payer: Medicare Other | Admitting: Nutrition

## 2019-11-29 NOTE — Telephone Encounter (Signed)
NUTRITION  Telephone follow-up completed with patient. Patient reports diarrhea has resolved, states she was constipated over the weekend that was resolved after eating some peaches. She reports occasional soreness around her gums, denied bleeding. Patient reports improvements to daily po intake. She recalls having a sandwich in the afternoon with fruit or yogurt and reports eating a [employer] sandwich yesterday. She is drinking 1 Boost/day as well as 3 bottles of water. Patient reports that she has learned how to give herself TF, endorses 1 carton in the morning and another carton around 7 pm with 90 ml water flushes before and after feedings.   Nutrition diagnosis of inadequate oral intake continues  Intervention:  RD educated on the importance on increasing oral intake and TF to minimize weight loss. Encouraged patient to have something small every few hours, increasing Boost to twice daily, and using 30 ml ProSource twice daily. Instructed patient to mix ProSource with 30 ml of water, infuse, and flush with 60 ml of water after infusion. RD suggested using a soft tooth brush when brushing her teeth, and offered mouth rinse recipe to patient, she politely declined. RD also suggesting that she could use a warm salt water rinse twice daily as well.   All questions answered, teach back used.   Monitoring, Evaluation, Goals: Patient will tolerated increased calories and protein to minimize further weight loss.   Next visit: Tuesday May 25th via telephone.

## 2019-12-06 ENCOUNTER — Inpatient Hospital Stay: Payer: Medicare Other | Admitting: Nutrition

## 2019-12-06 NOTE — Progress Notes (Signed)
Nutrition  Nutrition follow-up completed with patient via telephone. Patient reports having a few episodes of diarrhea that started on Sunday after eating a few bites of [place] food. Patient reports taking Imodium, denied having diarrhea today. Patient reports decreased oral intake of food this week, reports meals prepared by her family are too spicy and unable to tolerate. Patient has been infusing 3-4 bottles of Osmolite 1.5 daily with 60 ml water flushes before and after feedings, reports that she has 16 cartons of Osmolite 1.5, requesting information on how to obtain more. She is drinking 3 bottles of water daily. Patient is not drinking nutrition supplements or infusing protein modular. Patient reports her son purchased her a soft tooth brush, endorses improvements to sore gums.   Nutrition diagnosis: Inadequate oral intake continues  Intervention: RD contacted [place] representative, returned phone call to patient this afternoon with instructions/contact telephone for ordering supplies Encouraged patient to request family to prepare meals without heavy seasonings while cooking. Encouraged patient to try and drink nutrition supplements throughout the day. Reviewed soft foods and recommended trying to eat something by mouth every few hours as able.  Patient agrees to infuse 30 ml ProSource mixed with 30 ml water with 60 ml flush twice daily. All questions answered, teach back used.   Monitoring, Evaluation, Goals: Patient will tolerate increased calories and protein to minimize further weight loss.  Next visit: Tuesday, June 8 via telephone

## 2019-12-19 ENCOUNTER — Telehealth: Payer: Self-pay | Admitting: Nutrition

## 2019-12-19 NOTE — Telephone Encounter (Signed)
Left message for patient to verify telephone visit for pre reg °

## 2019-12-20 ENCOUNTER — Inpatient Hospital Stay: Payer: Medicare Other | Attending: Radiation Oncology | Admitting: Nutrition

## 2019-12-20 NOTE — Progress Notes (Signed)
Contacted patient by telephone. She reports she is eating well. She has been using 1/2 carton of Osmolite 1.5 via tube twice a day. She flushes the tube with 90 mL water before and after bolus. Reports the tube "is not working" correctly. It is leaking and there is some blood around the site. She is cleaning it 2 times daily. She wants to know when she can have it removed.  Reports diarrhea is resolved after taking immodium and says it was a virus causing it. She denies nutrition impact symptoms.  No recent wt documented. Patient is seeing her primary doctor tomorrow and reports they will weigh her. She says her pants are getting tight so she knows she has gained weight.  Reports she is eating 3 meals daily consisting of a breakfast bar at breakfast, a ham and cheese sandwich at lunch and grilled chicken, noodles, salad and broccoli and green peas for dinner.  States she is confused about her upcoming appointments.   Nutrition Diagnosis: Inadequate oral intake improved.  Intervention: Educated to continue 3 meals daily with snacks as tolerated.  Educated to allow 2 hours between oral intake and tube feeding if she is going to use her feeding tube. Otherwise, she needs to continue to flush once daily and increase oral intake. Contacted Navigator to discuss patient plan. Navigator will follow up on patient weight and determine next steps including discussion with Dr. Isidore Moos. I am available to follow up with patient in person if she is coming in for a weight check.  Monitoring, Evaluation, Goals: Patient will continue to increase oral intake for weight gain. MD to determine time for tube removal.  Next visit: To be determined.

## 2019-12-23 ENCOUNTER — Telehealth: Payer: Self-pay | Admitting: Nutrition

## 2019-12-23 ENCOUNTER — Other Ambulatory Visit: Payer: Self-pay

## 2019-12-23 DIAGNOSIS — C320 Malignant neoplasm of glottis: Secondary | ICD-10-CM

## 2019-12-23 NOTE — Telephone Encounter (Signed)
I contacted patient by phone.  Explained that patient would have to increase oral intake and drink oral nutrition supplements in order to maintain/gain weight after feeding tube is removed.  Patient reports she likes Ensure and has been consuming daily.  She also is craving vanilla milkshakes.  Patient reports the skin around her feeding tube is bleeding and patient has had to clean it multiple times daily.  Patient reports she can comply with increased protein shakes and agrees to weekly phone calls by RD.  I have scheduled to contact patient by telephone on Fridays for approximately 1 month.

## 2019-12-30 ENCOUNTER — Telehealth: Payer: Self-pay | Admitting: Nutrition

## 2019-12-30 NOTE — Telephone Encounter (Signed)
Nutrition follow-up completed with patient over the telephone.  She reports she is eating well.  Reports she had a roast beef sandwich, [place] fries and a milkshake for lunch and a steak, baked potato and salad for dinner.  She states her taste has improved and she is even started drinking coffee.  Reports weight at last MD visit was 128 pounds.  States she can hardly button her pants.  She does not have a scale at home.  Her feeding tube is scheduled to be removed on the 22nd.  Reinforced importance of patient maintaining adequate calories and protein.  Patient is agreeable.  I will contact patient next Friday by telephone.  **Disclaimer: This note was dictated with voice recognition software. Similar sounding words can inadvertently be transcribed and this note may contain transcription errors which may not have been corrected upon publication of note.**

## 2020-01-03 ENCOUNTER — Ambulatory Visit (HOSPITAL_COMMUNITY)
Admission: RE | Admit: 2020-01-03 | Discharge: 2020-01-03 | Disposition: A | Discharge status: Home / Self Care | Payer: Medicare Other | Source: Ambulatory Visit | Attending: Radiation Oncology | Admitting: Radiation Oncology

## 2020-01-03 ENCOUNTER — Other Ambulatory Visit: Payer: Self-pay

## 2020-01-03 DIAGNOSIS — Z431 Encounter for attention to gastrostomy: Secondary | ICD-10-CM | POA: Diagnosis not present

## 2020-01-03 DIAGNOSIS — C320 Malignant neoplasm of glottis: Secondary | ICD-10-CM | POA: Diagnosis not present

## 2020-01-03 DIAGNOSIS — Z91040 Latex allergy status: Secondary | ICD-10-CM | POA: Diagnosis not present

## 2020-01-03 DIAGNOSIS — E0590 Thyrotoxicosis, unspecified without thyrotoxic crisis or storm: Secondary | ICD-10-CM | POA: Diagnosis not present

## 2020-01-03 DIAGNOSIS — Z8249 Family history of ischemic heart disease and other diseases of the circulatory system: Secondary | ICD-10-CM | POA: Insufficient documentation

## 2020-01-03 DIAGNOSIS — F17210 Nicotine dependence, cigarettes, uncomplicated: Secondary | ICD-10-CM | POA: Diagnosis not present

## 2020-01-03 DIAGNOSIS — Z7901 Long term (current) use of anticoagulants: Secondary | ICD-10-CM | POA: Diagnosis not present

## 2020-01-03 DIAGNOSIS — Z79899 Other long term (current) drug therapy: Secondary | ICD-10-CM | POA: Diagnosis not present

## 2020-01-03 DIAGNOSIS — I4891 Unspecified atrial fibrillation: Secondary | ICD-10-CM | POA: Insufficient documentation

## 2020-01-03 DIAGNOSIS — Z8042 Family history of malignant neoplasm of prostate: Secondary | ICD-10-CM | POA: Diagnosis not present

## 2020-01-03 DIAGNOSIS — Z881 Allergy status to other antibiotic agents status: Secondary | ICD-10-CM | POA: Insufficient documentation

## 2020-01-03 DIAGNOSIS — Z800 Family history of malignant neoplasm of digestive organs: Secondary | ICD-10-CM | POA: Diagnosis not present

## 2020-01-03 DIAGNOSIS — F419 Anxiety disorder, unspecified: Secondary | ICD-10-CM | POA: Insufficient documentation

## 2020-01-03 DIAGNOSIS — I10 Essential (primary) hypertension: Secondary | ICD-10-CM | POA: Diagnosis not present

## 2020-01-03 DIAGNOSIS — M069 Rheumatoid arthritis, unspecified: Secondary | ICD-10-CM | POA: Insufficient documentation

## 2020-01-03 DIAGNOSIS — Z8349 Family history of other endocrine, nutritional and metabolic diseases: Secondary | ICD-10-CM | POA: Diagnosis not present

## 2020-01-03 DIAGNOSIS — Z8051 Family history of malignant neoplasm of kidney: Secondary | ICD-10-CM | POA: Diagnosis not present

## 2020-01-03 HISTORY — PX: IR GASTROSTOMY TUBE REMOVAL: IMG5492

## 2020-01-03 MED ORDER — LIDOCAINE VISCOUS HCL 2 % MT SOLN
OROMUCOSAL | Status: AC
  Filled 2020-01-03: qty 15

## 2020-01-03 NOTE — Consult Note (Signed)
Chief Complaint: Gastrostomy tube removal no longer needed  Referring Physician(s): Dr. Pearlie Oyster  Supervising Physician: Sandi Mariscal  Patient Status: [facility] - Out-pt  History of Present Illness: Rhonda Marshall is a 69 y.o. 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  IR placed a gastrostomy tube on 4.5.21. Team is requesting gastrostomy tube removal as patient is now able to eat orally and maintain her weight .  Past Medical History:  Diagnosis Date  . Anxiety   . Atrial fibrillation ([place])    newly diagnosed 10/2010  . Family history of adverse reaction to anesthesia    Sister's BP drops   . Hypertension   . Rheumatoid arthritis(714.0)   . Subclinical hyperthyroidism     Past Surgical History:  Procedure Laterality Date  . Arm surgery     fracture, right arm  . CARDIOVERSION N/A 08/10/2014   Procedure: CARDIOVERSION;  Surgeon: Dorothy Spark, MD;  Location: [place];  Service: Cardiovascular;  Laterality: N/A;  . CATARACT EXTRACTION    . DIRECT LARYNGOSCOPY N/A 09/02/2019   Procedure: DIRECT LARYNGOSCOPY WITH BIOPSY;  Surgeon: Rozetta Nunnery, MD;  Location: [place];  Service: ENT;  Laterality: N/A;  . IR GASTROSTOMY TUBE MOD SED  10/17/2019  . TUBAL LIGATION      Allergies: Clindamycin/lincomycin and Latex  Medications: Prior to Admission medications   Medication Sig Start Date End Date Taking? Authorizing Provider  ALPRAZolam Duanne Moron) 1 MG tablet Take 1 mg by mouth 3 (three) times daily as needed for anxiety.  05/11/14   [provider]  apixaban (ELIQUIS) 5 MG TABS tablet Take 1 tablet (5 mg total) by mouth 2 (two) times daily. 10/20/19   Minus Breeding, MD  diltiazem (CARDIZEM CD) 120 MG 24 hr capsule Take 1 capsule (120 mg total) by mouth daily. OFFICE VISIT NEEDED 06/22/19 10/12/19  Minus Breeding, MD  gabapentin (NEURONTIN) 250 MG/5ML solution Take 6 mLs (300 mg total) by mouth 3 (three) times  daily. 10/24/19   Eppie Gibson, MD  lidocaine (XYLOCAINE) 2 % solution Patient: Mix 1part 2% viscous lidocaine, 1part H20. Swallow 27mL of diluted mixture, 66min before meals and at bedtime, up to QID Patient taking differently: Use as directed 10 mLs in the mouth or throat See admin instructions. Patient: Mix 1part 2% viscous lidocaine, 1part H20. Swallow 65mL of diluted mixture, 91min before meals and at bedtime, up to 4 times daily as needed for throat pain 09/26/19   Eppie Gibson, MD  metoprolol tartrate (LOPRESSOR) 100 MG tablet Take 2 tablets (200 mg total) by mouth 2 (two) times daily. 10/10/19   Minus Breeding, MD  Multiple Vitamin (MULTIVITAMIN WITH MINERALS) TABS tablet Take 1 tablet by mouth daily.    [provider]  Nutritional Supplements (FEEDING SUPPLEMENT, OSMOLITE 1.5 CAL,) LIQD Give 1/2 bottle Osmolite 1.5 QID via PEG with 90 mL free water before and after bolus feedings. Increase by 1/2 carton Osmolite 1.5 daily until goal rate of 5 bottles daily. Give 30 mL ProSource or equivalent mixed with 30 mL water BID via PEG. Flush with 60 mL water. 10/17/19   Eppie Gibson, MD  oxyCODONE-acetaminophen (PERCOCET) 10-325 MG tablet Take 1 tablet by mouth 4 (four) times daily as needed for pain.    [provider]  propylthiouracil (PTU) 50 MG tablet Take 1 tablet (50 mg total) by mouth daily. TAKE 1 TABLET BY MOUTH ONCE DAILY. Patient taking differently: Take 50 mg by mouth daily.  06/29/19  Elayne Snare, MD     Family History  Problem Relation Age of Onset  . Heart attack Mother 21       pacemaker age 70  . Hypothyroidism Mother   . Cancer Father        colon and prostate  . Diabetes Brother   . CAD Brother 38  . Cancer Brother        Renal  . Hypothyroidism Sister     Social History   Socioeconomic History  . Marital status: Widowed    Spouse name: Not on file  . Number of children: 1  . Years of education: Not on file  . Highest education level: Not on file   Occupational History  . Occupation: laborer    Comment: Warehouse manager  Tobacco Use  . Smoking status: Current Every Day Smoker    Packs/day: 0.25    Years: 54.00    Pack years: 13.50    Types: Cigarettes    Start date: 55  . Smokeless tobacco: Never Used  . Tobacco comment: She has almost quit completely. She is taking  a "few puffs daily" -09/09/19  Vaping Use  . Vaping Use: Never used  Substance and Sexual Activity  . Alcohol use: Not Currently    Alcohol/week: 0.0 standard drinks  . Drug use: No  . Sexual activity: Not on file  Other Topics Concern  . Not on file  Social History Narrative  . Not on file   Social Determinants of Health   Financial Resource Strain:   . Difficulty of Paying Living Expenses:   Food Insecurity:   . Worried About Charity fundraiser in the Last Year:   . Arboriculturist in the Last Year:   Transportation Needs:   . Film/video editor (Medical):   Marland Kitchen Lack of Transportation (Non-Medical):   Physical Activity:   . Days of Exercise per Week:   . Minutes of Exercise per Session:   Stress:   . Feeling of Stress :   Social Connections:   . Frequency of Communication with Friends and Family:   . Frequency of Social Gatherings with Friends and Family:   . Attends Religious Services:   . Active Member of Clubs or Organizations:   . Attends Archivist Meetings:   Marland Kitchen Marital Status:     Review of Systems: A 12 point ROS discussed and pertinent positives are indicated in the HPI above.  All other systems are negative.  Review of Systems  Constitutional: Negative for fatigue and fever.  HENT: Negative for congestion.   Respiratory: Negative for cough and shortness of breath.   Gastrointestinal: Negative for abdominal pain, diarrhea, nausea and vomiting.    Vital Signs: There were no vitals taken for this visit.  Physical Exam Vitals and nursing note reviewed.  Constitutional:      Appearance: She is well-developed.    HENT:     Head: Normocephalic and atraumatic.  Eyes:     Conjunctiva/sclera: Conjunctivae normal.  Pulmonary:     Effort: Pulmonary effort is normal.  Abdominal:     Comments: Positive gastrostomy tube.   Musculoskeletal:        General: Normal range of motion.     Cervical back: Normal range of motion.  Skin:    General: Skin is warm.  Neurological:     Mental Status: She is alert and oriented to person, place, and time.     Imaging: No results found.  Labs:  CBC: Recent Labs    05/06/19 1632 10/17/19 1055  WBC 12.8* 6.4  HGB 14.6 11.2*  HCT 41.4 33.7*  PLT 301 194    COAGS: Recent Labs    10/17/19 1055  INR 1.1    BMP: Recent Labs    10/10/19 1441 10/17/19 0935 10/20/19 1311  NA 141 143 142  K 4.7 4.1 4.3  CL 104 109 107  CO2 26 23 27   GLUCOSE 99 96 132*  BUN 12 10 11   CALCIUM 10.2 9.5 9.5  CREATININE 1.34* 0.96 0.92  GFRNONAA 40* 60* >60  GFRAA 46* >60 >60    LIVER FUNCTION TESTS: Recent Labs    07/05/19 1352 10/17/19 0935 10/20/19 1311  BILITOT  --  0.6 0.4  AST  --  20 17  ALT 13 17 13   ALKPHOS  --  84 99  PROT  --  7.0 6.9  ALBUMIN  --  3.7 3.5    TUMOR MARKERS: No results for input(s): AFPTM, CEA, CA199, CHROMGRNA in the last 8760 hours.  Assessment and Plan: 69 y.o, female outpatient. History of laryngeal - glottis carcinoma. A fib on eliquis.  IR placed a gastrostomy tube on 4.5.21. Team is requesting gastrostomy tube removal as patient is now able to eat orally and maintain her weight .  Pertinent Imaging None since g tube placement  Pertinent IR History 4.5.21 - G tube placement  Pertinent Allergies Clindamycin Latex  No recent labs are within acceptable parameters. Patient is on eliquis    Thank you for this interesting consult.  I greatly enjoyed meeting Brenlyn Masini and look forward to participating in their care.  A copy of this report was sent to the requesting provider on this date.  Electronically  Signed: Avel Peace, NP 01/03/2020, 11:01 AM   I spent a total of  30 Minutes   in face to face in clinical consultation, greater than 50% of which was counseling/coordinating care for Gastrostomy tube removal no longer needed.

## 2020-01-06 ENCOUNTER — Telehealth: Payer: Self-pay | Admitting: Nutrition

## 2020-01-06 NOTE — Telephone Encounter (Signed)
Contacted patient by telephone. She reports she is happy the tube has been removed.  She is eating well but has not been weighed recently.  She has no nutrition impact symptoms.  She is hoping her cancer does not return.  She does not want to have more radiation therapy.  Enforced importance of patient continuing increased calories and protein for healing.  Recommended milkshakes or oral nutrition supplements to provide additional calories and protein for healing. Patient has my contact information for questions or concerns.  **Disclaimer: This note was dictated with voice recognition software. Similar sounding words can inadvertently be transcribed and this note may contain transcription errors which may not have been corrected upon publication of note.**

## 2020-01-09 ENCOUNTER — Other Ambulatory Visit: Payer: Self-pay | Admitting: Endocrinology

## 2020-01-17 ENCOUNTER — Encounter (INDEPENDENT_AMBULATORY_CARE_PROVIDER_SITE_OTHER): Payer: Self-pay | Admitting: Otolaryngology

## 2020-01-17 ENCOUNTER — Ambulatory Visit (INDEPENDENT_AMBULATORY_CARE_PROVIDER_SITE_OTHER): Payer: Medicare Other | Admitting: Otolaryngology

## 2020-01-17 ENCOUNTER — Other Ambulatory Visit: Payer: Self-pay

## 2020-01-17 VITALS — Temp 97.3°F

## 2020-01-17 DIAGNOSIS — J310 Chronic rhinitis: Secondary | ICD-10-CM | POA: Diagnosis not present

## 2020-01-17 DIAGNOSIS — Z8521 Personal history of malignant neoplasm of larynx: Secondary | ICD-10-CM

## 2020-01-17 NOTE — Progress Notes (Signed)
HPI: Rhonda Marshall is a 69 y.o. 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  She completed her radiation therapy toward the end of April.  She had a G-tube placed temporarily this is now been removed.  She is swallowing better.  Her voice is much better.  She does complain of postnasal drainage and some sinus issues..  Past Medical History:  Diagnosis Date  . Anxiety   . Atrial fibrillation ([place])    newly diagnosed 10/2010  . Family history of adverse reaction to anesthesia    Sister's BP drops   . Hypertension   . Rheumatoid arthritis(714.0)   . Subclinical hyperthyroidism    Past Surgical History:  Procedure Laterality Date  . Arm surgery     fracture, right arm  . CARDIOVERSION N/A 08/10/2014   Procedure: CARDIOVERSION;  Surgeon: Dorothy Spark, MD;  Location: [place] [facility];  Service: Cardiovascular;  Laterality: N/A;  . CATARACT EXTRACTION    . DIRECT LARYNGOSCOPY N/A 09/02/2019   Procedure: DIRECT LARYNGOSCOPY WITH BIOPSY;  Surgeon: Rozetta Nunnery, MD;  Location: [facility];  Service: ENT;  Laterality: N/A;  . IR GASTROSTOMY TUBE MOD SED  10/17/2019  . IR GASTROSTOMY TUBE REMOVAL  01/03/2020  . TUBAL LIGATION     Social History   Socioeconomic History  . Marital status: Widowed    Spouse name: Not on file  . Number of children: 1  . Years of education: Not on file  . Highest education level: Not on file  Occupational History  . Occupation: laborer    Comment: Warehouse manager  Tobacco Use  . Smoking status: Former Smoker    Packs/day: 0.25    Years: 54.00    Pack years: 13.50    Types: Cigarettes    Start date: 89    Quit date: 08/2019    Years since quitting: 0.4  . Smokeless tobacco: Never Used  . Tobacco comment: She has almost quit completely. She is taking  a "few puffs daily" -09/09/19  Vaping Use  . Vaping Use: Never  used  Substance and Sexual Activity  . Alcohol use: Not Currently    Alcohol/week: 0.0 standard drinks  . Drug use: No  . Sexual activity: Not on file  Other Topics Concern  . Not on file  Social History Narrative  . Not on file   Social Determinants of Health   Financial Resource Strain:   . Difficulty of Paying Living Expenses:   Food Insecurity:   . Worried About Charity fundraiser in the Last Year:   . Arboriculturist in the Last Year:   Transportation Needs:   . Film/video editor (Medical):   Marland Kitchen Lack of Transportation (Non-Medical):   Physical Activity:   . Days of Exercise per Week:   . Minutes of Exercise per Session:   Stress:   . Feeling of Stress :   Social Connections:   . Frequency of Communication with Friends and Family:   . Frequency of Social Gatherings with Friends and Family:   . Attends Religious Services:   . Active Member of Clubs or Organizations:   . Attends Archivist Meetings:   Marland Kitchen Marital Status:    Family History  Problem Relation Age of Onset  . Heart attack Mother 56       pacemaker age 8  . Hypothyroidism Mother   .  Cancer Father        colon and prostate  . Diabetes Brother   . CAD Brother 78  . Cancer Brother        Renal  . Hypothyroidism Sister    Allergies  Allergen Reactions  . Clindamycin/Lincomycin Rash  . Latex Rash    Latex gloves   Prior to Admission medications   Medication Sig Start Date End Date Taking? Authorizing Provider  ALPRAZolam Duanne Moron) 1 MG tablet Take 1 mg by mouth 3 (three) times daily as needed for anxiety.  05/11/14  Yes [provider]  apixaban (ELIQUIS) 5 MG TABS tablet Take 1 tablet (5 mg total) by mouth 2 (two) times daily. 10/20/19  Yes Minus Breeding, MD  gabapentin (NEURONTIN) 250 MG/5ML solution Take 6 mLs (300 mg total) by mouth 3 (three) times daily. 10/24/19  Yes Eppie Gibson, MD  lidocaine (XYLOCAINE) 2 % solution Patient: Mix 1part 2% viscous lidocaine, 1part H20.  Swallow 53mL of diluted mixture, 33min before meals and at bedtime, up to QID Patient taking differently: Use as directed 10 mLs in the mouth or throat See admin instructions. Patient: Mix 1part 2% viscous lidocaine, 1part H20. Swallow 14mL of diluted mixture, 31min before meals and at bedtime, up to 4 times daily as needed for throat pain 09/26/19  Yes Eppie Gibson, MD  metoprolol tartrate (LOPRESSOR) 100 MG tablet Take 2 tablets (200 mg total) by mouth 2 (two) times daily. 10/10/19  Yes Minus Breeding, MD  Multiple Vitamin (MULTIVITAMIN WITH MINERALS) TABS tablet Take 1 tablet by mouth daily.   Yes [provider]  Nutritional Supplements (FEEDING SUPPLEMENT, OSMOLITE 1.5 CAL,) LIQD Give 1/2 bottle Osmolite 1.5 QID via PEG with 90 mL free water before and after bolus feedings. Increase by 1/2 carton Osmolite 1.5 daily until goal rate of 5 bottles daily. Give 30 mL ProSource or equivalent mixed with 30 mL water BID via PEG. Flush with 60 mL water. 10/17/19  Yes Eppie Gibson, MD  oxyCODONE-acetaminophen (PERCOCET) 10-325 MG tablet Take 1 tablet by mouth 4 (four) times daily as needed for pain.   Yes [provider]  propylthiouracil (PTU) 50 MG tablet Take 1 tablet (50 mg total) by mouth 2 (two) times daily. 01/09/20  Yes Elayne Snare, MD  diltiazem (CARDIZEM CD) 120 MG 24 hr capsule Take 1 capsule (120 mg total) by mouth daily. OFFICE VISIT NEEDED 06/22/19 10/12/19  Minus Breeding, MD     Positive ROS: Otherwise negative  All other systems have been reviewed and were otherwise negative with the exception of those mentioned in the HPI and as above.  Physical Exam: Constitutional: Alert, well-appearing, no acute distress Ears: External ears without lesions or tenderness. Ear canals are clear bilaterally with intact, clear TMs.  Nasal: External nose without lesions. Septum midline with mild rhinitis.. Clear nasal passages bilaterally with no signs of infection. Oral: Lips and gums  without lesions. Tongue and palate mucosa without lesions. Posterior oropharynx clear.  Indirect laryngoscopy revealed a clear base of tongue and epiglottis. Fiberoptic laryngoscopy was performed to the left nostril.  The nasopharynx was clear.  The base of tongue, vallecula and epiglottis were normal.  She had mild supraglottic edema from radiation but mucosa was clear.  Vocal cords were clear bilaterally with normal vocal mobility.  No evidence of any residual disease. Neck: No palpable adenopathy or masses.  No palpable adenopathy in the neck on either side. Respiratory: Breathing comfortably  Skin: No facial/neck lesions or rash noted.  Laryngoscopy  Date/Time: 01/17/2020 6:08 PM Performed by: Rozetta Nunnery, MD Authorized by: Rozetta Nunnery, MD   Consent:    Consent obtained:  Verbal   Consent given by:  Patient Procedure details:    Indications: oncologic surveillance follow-up     Medication:  Afrin   Instrument: flexible fiberoptic laryngoscope     Scope location: left nare   Sinus:    Left nasopharynx: normal   Mouth:    Oropharynx: normal     Vallecula: normal     Base of tongue: normal     Epiglottis: normal   Throat:    True vocal cords: normal   Comments:     On fiberoptic laryngoscopy vocal cords were clear bilaterally with no evidence of any residual disease.  Vocal cords had normal mobility bilaterally.    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 She will follow up in 5 months for recheck.  She apparently is scheduled to follow-up with Dr. Isidore Moos in 3 months.   Radene Journey, MD

## 2020-01-23 NOTE — Progress Notes (Signed)
Oncology Nurse Navigator Documentation  I called and spoke to Ms. Beckel regarding missed SLP appointments in the past. I emphasized the importance of following with Garald Balding SLP to avoid possible side effects related to swallowing after radiation. I offered an appointment this upcoming Thursday during [place]. She politely declined, telling me she may be going out of town. She did tell me that she would call when she returned to get an appointment scheduled.   Harlow Asa RN, BSN, OCN Head & Neck Oncology Nurse [place] [facility] Phone # [phone]  Fax # [phone]

## 2020-02-03 ENCOUNTER — Other Ambulatory Visit: Payer: Self-pay | Admitting: Cardiology

## 2020-02-03 NOTE — Telephone Encounter (Signed)
Prescription refill request for Eliquis received. Indication: Atrial Fibrillation Last office visit: 05/06/2019 Dr Percival Spanish Scr: 1.34 10/10/2019 Age: 69 Weight: 55.8 kg  Prescription refilled

## 2020-03-06 ENCOUNTER — Other Ambulatory Visit: Payer: Self-pay | Admitting: Cardiology

## 2020-03-27 ENCOUNTER — Other Ambulatory Visit (INDEPENDENT_AMBULATORY_CARE_PROVIDER_SITE_OTHER): Payer: Medicare Other

## 2020-03-27 ENCOUNTER — Other Ambulatory Visit: Payer: Self-pay

## 2020-03-27 DIAGNOSIS — E041 Nontoxic single thyroid nodule: Secondary | ICD-10-CM | POA: Diagnosis not present

## 2020-03-27 LAB — TSH: TSH: 0.84 u[IU]/mL (ref 0.35–4.50)

## 2020-03-27 LAB — T4, FREE: Free T4: 0.61 ng/dL (ref 0.60–1.60)

## 2020-03-27 LAB — T3, FREE: T3, Free: 3.7 pg/mL (ref 2.3–4.2)

## 2020-04-03 ENCOUNTER — Other Ambulatory Visit: Payer: Self-pay

## 2020-04-03 ENCOUNTER — Ambulatory Visit (INDEPENDENT_AMBULATORY_CARE_PROVIDER_SITE_OTHER): Payer: Medicare Other | Admitting: Endocrinology

## 2020-04-03 VITALS — BP 122/72 | HR 73 | Temp 98.4°F | Ht 61.0 in | Wt 128.8 lb

## 2020-04-03 DIAGNOSIS — E0520 Thyrotoxicosis with toxic multinodular goiter without thyrotoxic crisis or storm: Secondary | ICD-10-CM

## 2020-04-03 NOTE — Progress Notes (Signed)
patient ID: Rhonda Marshall, female   DOB: 10/16/1950, 69 y.o.   MRN: [id number]                                                                                                                Reason for Appointment:  Hyperthyroidism, follow-up   Chief complaint: Follow-up for overactive thyroid   History of Present Illness:   Review of her records for thyroid History indicates that she reportedly had subclinical hyperthyroidism in 2012 when she was admitted for new onset atrial fibrillation Subsequently did not have any treatment or further evaluation She has been treated for atrial fibrillation by her cardiologist and was recommended evaluation for the thyroid recently  The patient is a poor historian and on her initial visit she did not give any symptoms of palpitations, shakiness, feeling excessively warm and sweaty or weight loss.  She did complain of nervousness and fatigue.   However she has also had issues with anxiety and depression  Since her baseline TSH was very low along with upper normal free T3 she has been on PTU 50 mg twice a day since October 2017 Baseline thyrotropin receptor antibody was negative With starting treatment she had less fatigue symptoms  She was scheduled for I-131 treatment because of the finding of a large hot nodule on the left side  However she could not do this since she was not able to be in isolation for a couple of days at home   RECENT history:   In 03/2017 when she was off her antithyroid drugs she was not having any symptoms like palpitations, however she was having some fatigue and shortness of breath and was having more problems with her tachycardia Her free T3 was up above normal at 4.3 at the same time  She has been treated with PTU 50 mg, the dose ranging from 1 up to 2 a day  Previously in 2/20 she was having relatively higher heart rate and her TSH was low normal at 0.3 requiring higher PTU dose  She was again told to increase her  PTU to twice a day instead of once a day on her last visit in 09/2019 However she has not followed up until now  She says after increasing the dose she felt better with her energy level and appetite Her weight has improved  Her thyroid levels are improved with normalized free T3 and TSH now  She still does not think she can do radioactive iodine treatment since she lives in a small house with grandchildren and only 1 bathroom is available  Wt Readings from Last 3 Encounters:  04/03/20 128 lb 12.8 oz (58.4 kg)  11/18/19 123 lb 2 oz (55.8 kg)  10/17/19 127 lb (57.6 kg)      Thyroid function tests as follows:     Lab Results  Component Value Date   FREET4 0.61 03/27/2020   FREET4 0.82 10/05/2019   FREET4 0.84 07/05/2019   T3FREE 3.7 03/27/2020   T3FREE 4.8 (H) 10/05/2019   T3FREE  4.1 07/05/2019   TSH 0.84 03/27/2020   TSH 0.26 (L) 10/05/2019   TSH 0.44 07/05/2019    Lab Results  Component Value Date   THYROTRECAB <0.50 12/24/2016     Allergies as of 04/03/2020      Reactions   Clindamycin/lincomycin Rash   Latex Rash   Latex gloves      Medication List       Accurate as of April 03, 2020  3:42 PM. If you have any questions, ask your nurse or doctor.        ALPRAZolam 1 MG tablet Commonly known as: XANAX Take 1 mg by mouth 3 (three) times daily as needed for anxiety.   diltiazem 120 MG 24 hr capsule Commonly known as: CARDIZEM CD Take 1 capsule (120 mg total) by mouth daily. OFFICE VISIT NEEDED   Eliquis 5 MG Tabs tablet Generic drug: apixaban Take 1 tablet (5 mg total) by mouth 2 (two) times daily.   feeding supplement (OSMOLITE 1.5 CAL) Liqd Give 1/2 bottle Osmolite 1.5 QID via PEG with 90 mL free water before and after bolus feedings. Increase by 1/2 carton Osmolite 1.5 daily until goal rate of 5 bottles daily. Give 30 mL ProSource or equivalent mixed with 30 mL water BID via PEG. Flush with 60 mL water.   gabapentin 250 MG/5ML solution Commonly  known as: NEURONTIN Take 6 mLs (300 mg total) by mouth 3 (three) times daily.   lidocaine 2 % solution Commonly known as: XYLOCAINE Patient: Mix 1part 2% viscous lidocaine, 1part H20. Swallow 65mL of diluted mixture, 59min before meals and at bedtime, up to QID What changed:   how much to take  how to take this  when to take this  additional instructions   metoprolol tartrate 100 MG tablet Commonly known as: LOPRESSOR Take 2 tablets (200 mg total) by mouth 2 (two) times daily.   multivitamin with minerals Tabs tablet Take 1 tablet by mouth daily.   oxyCODONE-acetaminophen 10-325 MG tablet Commonly known as: PERCOCET Take 1 tablet by mouth 4 (four) times daily as needed for pain.   propylthiouracil 50 MG tablet Commonly known as: PTU Take 1 tablet (50 mg total) by mouth 2 (two) times daily.           Past Medical History:  Diagnosis Date  . Anxiety   . Atrial fibrillation ([place])    newly diagnosed 10/2010  . Family history of adverse reaction to anesthesia    Sister's BP drops   . Hypertension   . Rheumatoid arthritis(714.0)   . Subclinical hyperthyroidism     Past Surgical History:  Procedure Laterality Date  . Arm surgery     fracture, right arm  . CARDIOVERSION N/A 08/10/2014   Procedure: CARDIOVERSION;  Surgeon: Dorothy Spark, MD;  Location: [facility];  Service: Cardiovascular;  Laterality: N/A;  . CATARACT EXTRACTION    . DIRECT LARYNGOSCOPY N/A 09/02/2019   Procedure: DIRECT LARYNGOSCOPY WITH BIOPSY;  Surgeon: Rozetta Nunnery, MD;  Location: [facility];  Service: ENT;  Laterality: N/A;  . IR GASTROSTOMY TUBE MOD SED  10/17/2019  . IR GASTROSTOMY TUBE REMOVAL  01/03/2020  . TUBAL LIGATION      Family History  Problem Relation Age of Onset  . Heart attack Mother 16       pacemaker age 90  . Hypothyroidism Mother   . Cancer Father        colon and prostate  . Diabetes Brother   .  CAD Brother 8  . Cancer Brother         Renal  . Hypothyroidism Sister     Social History:  reports that she quit smoking about 7 months ago. Her smoking use included cigarettes. She started smoking about 54 years ago. She has a 13.50 pack-year smoking history. She has never used smokeless tobacco. She reports previous alcohol use. She reports that she does not use drugs.  Allergies:  Allergies  Allergen Reactions  . Clindamycin/Lincomycin Rash  . Latex Rash    Latex gloves     Review of Systems  She has chronic atrial fibrillation followed by cardiologist  She is on metoprolol 200 mg twice daily for heart rate control followed by cardiologist   Examination:   BP 122/72 (BP Location: Left Arm, Patient Position: Sitting, Cuff Size: Normal)   Pulse 73   Temp 98.4 F (36.9 C) (Oral)   Ht 5\' 1"  (1.549 m)   Wt 128 lb 12.8 oz (58.4 kg)   SpO2 96%   BMI 24.34 kg/m   Thyroid is just palpable on the right side, not easily palpable on the left No tremor   Assessment/Plan:   Hyperthyroidism, mild and caused by hot nodule on the left Previously on exam has had a multinodular goiter, larger on the left  Her symptoms with the hyperthyroidism at baseline had been nonspecific in the form of mild fatigue and exertional shortness of breath Also has history of atrial fibrillation  4 treatment and on her last visit she had mild T3 toxicosis Because of difficulty doing I-131 treatment she is on 50 mg PTU, now twice a day  She has been regular with her medication and has no difficulty swallowing now  Thyroid levels are back to normal plan she can continue the PTU twice a day  Again advised her that if she is able to do the radioactive iodine treatment she will let us know  Elayne Snare 04/03/2020, 3:42 PM     Note: This office note was prepared with Dragon voice recognition system technology. Any transcriptional errors that result from this process are unintentional.

## 2020-04-11 ENCOUNTER — Other Ambulatory Visit: Payer: Self-pay | Admitting: Endocrinology

## 2020-04-19 NOTE — Progress Notes (Addendum)
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 Reports sense of taste has returned and she enjoys eating again Smoking or chewing tobacco? None Using fluoride trays daily? N/A Last ENT visit was on:  01/17/2020 Saw Dr. [name]:  Laryngoscopy Sinus:    Left nasopharynx: normal   Mouth:    Oropharynx: normal     Vallecula: normal     Base of tongue: normal     Epiglottis: normal   Throat:    True vocal cords: normal   Comments:     On fiberoptic laryngoscopy vocal cords were clear bilaterally with no evidence of any residual disease.  Vocal cords had normal mobility bilaterally. Plan: Prescribe Nasacort to use for nasal sinus symptoms as well as antihistamine over-the-counter as needed. She will follow up in 5 months for recheck.  Other notable issues, if any: Endocrinologist continues to monitor thyroid nodule. Skin in treatment field looks well healed and patient denies any lymphedema in her neck. She is dealing with several personal hardships, but is in good spirits and pleased with her recovery thus far.  Vitals:   04/20/20 1345  BP: 127/71  Pulse: 88  Resp: 18  Temp: (!) 97.3 F (36.3 C)  SpO2: 95%

## 2020-04-20 ENCOUNTER — Other Ambulatory Visit: Payer: Self-pay

## 2020-04-20 ENCOUNTER — Ambulatory Visit
Admission: RE | Admit: 2020-04-20 | Discharge: 2020-04-20 | Disposition: A | Discharge status: Home / Self Care | Payer: Medicare Other | Source: Ambulatory Visit | Attending: Radiation Oncology | Admitting: Radiation Oncology

## 2020-04-20 VITALS — BP 127/71 | HR 88 | Temp 97.3°F | Resp 18

## 2020-04-20 DIAGNOSIS — C320 Malignant neoplasm of glottis: Secondary | ICD-10-CM

## 2020-04-20 DIAGNOSIS — Z923 Personal history of irradiation: Secondary | ICD-10-CM | POA: Diagnosis not present

## 2020-04-20 DIAGNOSIS — Z8521 Personal history of malignant neoplasm of larynx: Secondary | ICD-10-CM | POA: Insufficient documentation

## 2020-04-20 MED ORDER — LARYNGOSCOPY SOLUTION RAD-ONC
15.0000 mL | Freq: Once | TOPICAL | Status: AC
  Administered 2020-04-20: 15 mL via TOPICAL
  Filled 2020-04-20: qty 15

## 2020-04-24 ENCOUNTER — Other Ambulatory Visit: Payer: Self-pay | Admitting: Endocrinology

## 2020-04-25 ENCOUNTER — Encounter: Payer: Self-pay | Admitting: Radiation Oncology

## 2020-04-25 NOTE — Progress Notes (Addendum)
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 Reports sense of taste has returned and she enjoys eating again Smoking or chewing tobacco? None Using fluoride trays daily? N/A Last ENT visit was on:  01/17/2020 Saw Dr. Melony Marshall:  Laryngoscopy Sinus:    Left nasopharynx: normal   Mouth:    Oropharynx: normal     Vallecula: normal     Base of tongue: normal     Epiglottis: normal   Throat:    True vocal cords: normal   Comments:     On fiberoptic laryngoscopy vocal cords were clear bilaterally with no evidence of any residual disease.  Vocal cords had normal mobility bilaterally. Plan: Prescribe Nasacort to use for nasal sinus symptoms as well as antihistamine over-the-counter as needed. She will follow up in 5 months for recheck.  Other notable issues, if any: Endocrinologist continues to monitor thyroid nodule. Skin in treatment field looks well healed and patient denies any  lymphedema in her neck. She is dealing with several personal hardships, but is in good spirits and pleased with her recovery thus far.   ALLERGIES:  is allergic to clindamycin/lincomycin and latex.  Meds: Current Outpatient Medications  Medication Sig Dispense Refill  . ALPRAZolam (XANAX) 1 MG tablet Take 1 mg by mouth 3 (three) times daily as needed for anxiety.   0  . diltiazem (CARDIZEM CD) 120 MG 24 hr capsule Take 1 capsule (120 mg total) by mouth daily. OFFICE VISIT NEEDED 45 capsule 9  . ELIQUIS 5 MG TABS tablet Take 1 tablet (5 mg total) by mouth 2 (two) times daily. 180 tablet 1  . gabapentin (NEURONTIN) 250 MG/5ML solution Take 6 mLs (300 mg total) by mouth 3 (three) times daily. 470 mL 0  . metoprolol tartrate (LOPRESSOR) 100 MG tablet Take 2 tablets (200 mg total) by mouth 2 (two) times daily. 120 tablet 5  . oxyCODONE-acetaminophen (PERCOCET) 10-325 MG tablet Take 1 tablet by mouth 4 (four) times daily as needed for pain.    Marland Kitchen lidocaine (XYLOCAINE) 2 % solution Patient: Mix 1part 2% viscous lidocaine, 1part H20. Swallow 17mL of diluted mixture, 37min before meals and at bedtime, up to QID (Patient not taking: Reported on 04/20/2020) 200 mL 3  . Multiple Vitamin (MULTIVITAMIN WITH MINERALS) TABS tablet Take 1 tablet by mouth daily. (Patient not taking: Reported on 04/20/2020)    . propylthiouracil (PTU) 50 MG tablet Take 1 tablet (  50 mg total) by mouth 2 (two) times daily. 60 tablet 3   No current facility-administered medications for this encounter.    Physical Findings: The patient is in no acute distress. Patient is alert and oriented. Wt Readings from Last 3 Encounters:  04/03/20 128 lb 12.8 oz (58.4 kg)  11/18/19 123 lb 2 oz (55.8 kg)  10/17/19 127 lb (57.6 kg)    oral temperature is 97.3 F (36.3 C) (abnormal). Her blood pressure is 127/71 and her pulse is 88. Her respiration is 18 and oxygen saturation is 95%. .  General: Alert and oriented, in no acute distress,  hoarseness has improved dramatically HEENT: Head is normocephalic. Extraocular movements are intact. Oropharynx/mouth notable for no lesions Neck:  well-healed skin with no palpable lymphadenopathy; +anterior lymphedema Skin: Skin over neck is warm, dry, intact Psychiatric: Judgment and insight are intact. Affect is appropriate.  PROCEDURE NOTE: After obtaining consent and anesthetizing the nasal cavity with topical lidocaine and phenylephrine, the flexible endoscope was introduced and passed through the nasal cavity.  No visible lesions in the nasopharynx, pharynx, hypopharynx, or supraglottic larynx.  The true vocal cords are symmetrically mobile without any lesions.   Lab Findings: Lab Results  Component Value Date   WBC 6.4 10/17/2019   HGB 11.2 (L) 10/17/2019   HCT 33.7 (L) 10/17/2019   MCV 99.4 10/17/2019   PLT 194 10/17/2019    Lab Results  Component Value Date   TSH 0.84 03/27/2020    Radiographic Findings: No results found.  Impression/Plan: This is a delightful woman with a history of glottic cancer, status post radiation therapy.  No evidence of disease on laryngoscopy today.  She does have some lymphedema for which we will refer her to physical therapy.  1) Head and Neck Cancer Status: As above  2) Nutritional Status: Her PEG tube has been removed and her weight is stable   3) Risk Factors: The patient has been educated about risk factors including alcohol and tobacco abuse; they understand that avoidance of alcohol and tobacco is important to prevent recurrences as well as other cancers  4) Swallowing: Good function, continue speech-language pathology exercises.  5)  Thyroid function: Check annually Lab Results  Component Value Date   TSH 0.84 03/27/2020    6)  Follow-up in March 2022.  She sees ENT in December.  7) We discussed measures to reduce the risk of infection during the COVID-19 pandemic.  At length, we again discussed the COVID-19 vaccine.  We  discussed why the benefits of the vaccine far outweigh the risks.  We discussed the potential risks of COVID-19.  All questions were answered to her satisfaction.  She remains hesitant to receive the vaccine right now.  She understands that I strongly recommend she received the vaccine as soon as possible.  On date of service, in total, I spent 30 minutes on this encounter.  She was seen in person.   _____________________________________   Eppie Gibson, MD

## 2020-05-01 ENCOUNTER — Other Ambulatory Visit: Payer: Self-pay

## 2020-05-01 ENCOUNTER — Ambulatory Visit: Payer: Medicare Other | Attending: Radiation Oncology

## 2020-05-01 DIAGNOSIS — I890 Lymphedema, not elsewhere classified: Secondary | ICD-10-CM | POA: Insufficient documentation

## 2020-05-01 DIAGNOSIS — R600 Localized edema: Secondary | ICD-10-CM | POA: Diagnosis present

## 2020-05-01 NOTE — Patient Instructions (Signed)
Chip pack was made for pt to tie under her chin to decrease firm area.  Instructed to wear 30 min and increase up to 4 hours if tolerated to decrease firmness/swelling

## 2020-05-01 NOTE — Therapy (Signed)
[place], [place], [address] Phone: [phone]   Fax:  [phone]  Physical Therapy Evaluation  Patient Details  Name: Rhonda Marshall MRN: [id number] Date of Birth: [birth year] Referring Provider (PT): Dr. Isidore Moos   Encounter Date: 05/01/2020   PT End of Session - 05/01/20 1616    Visit Number 1    Number of Visits 6    Date for PT Re-Evaluation 06/12/20    Progress Note Due on Visit 6    PT Start Time 3007    PT Stop Time 1450    PT Time Calculation (min) 45 min    Activity Tolerance Patient tolerated treatment well    Behavior During Therapy [facility] for tasks assessed/performed           Past Medical History:  Diagnosis Date  . Anxiety   . Atrial fibrillation ([place])    newly diagnosed 10/2010  . Family history of adverse reaction to anesthesia    Sister's BP drops   . Hypertension   . Rheumatoid arthritis(714.0)   . Subclinical hyperthyroidism     Past Surgical History:  Procedure Laterality Date  . Arm surgery     fracture, right arm  . CARDIOVERSION N/A 08/10/2014   Procedure: CARDIOVERSION;  Surgeon: Dorothy Spark, MD;  Location: [facility];  Service: Cardiovascular;  Laterality: N/A;  . CATARACT EXTRACTION    . DIRECT LARYNGOSCOPY N/A 09/02/2019   Procedure: DIRECT LARYNGOSCOPY WITH BIOPSY;  Surgeon: Rozetta Nunnery, MD;  Location: [facility];  Service: ENT;  Laterality: N/A;  . IR GASTROSTOMY TUBE MOD SED  10/17/2019  . IR GASTROSTOMY TUBE REMOVAL  01/03/2020  . TUBAL LIGATION      There were no vitals filed for this visit.    Subjective Assessment - 05/01/20 1359    Subjective Developed swelling after radiation on April 29,2021.  Dr. wanted her to come to learn MLD.  Able to eat better now and can drink without any problems. No reported ROM difficulties in neck, but has DDD.    Pertinent History In January 2021 developed hoarseness and had first  laryngoscopy on 09/02/19 and was diagnosed with a vocal cord lesion.  She underwent radiation which finished in April    Currently in Pain? No/denies    Pain Score 0-No pain              [facility] PT Assessment - 05/01/20 0001      Assessment   Medical Diagnosis malignant neoplasm of glottis    Referring Provider (PT) Dr. Deanne Coffer Dominance Right    Prior Therapy NA      Precautions   Precautions --   Prior radiation     Balance Screen   Has the patient fallen in the past 6 months No    Has the patient had a decrease in activity level because of a fear of falling?  No      Home Environment   Living Environment Private residence    Living Arrangements Non-relatives/Friends    Available Help at Discharge Friend(s)      Prior Function   Level of [place] Retired    Leisure Social worker,      Cognition   Overall Cognitive Status Within Functional Limits for tasks assessed      Observation/Other Assessments   Observations darkening of skin from radiation      Observation/Other Assessments-Edema    Edema --  firm swelling under chin     Posture/Postural Control   Posture/Postural Control Postural limitations    Postural Limitations Rounded Shoulders      AROM   Cervical Flexion WFL    Cervical - Right Side Bend WFL    Cervical - Left Side Bend WFL    Cervical - Right Rotation WFL    Cervical - Left Rotation WFL      Palpation   Palpation comment --   Firm to palpation under chin secondary to edema            LYMPHEDEMA/ONCOLOGY QUESTIONNAIRE - 05/01/20 0001      Type   Cancer Type mlaignant neoplasm of glottiis      Surgeries   Other Surgery Date --   NA;non surgical     Treatment   Active Chemotherapy Treatment No    Past Chemotherapy Treatment No    Active Radiation Treatment No    Past Radiation Treatment Yes    Date 11/09/19    Body Site neck    Current Hormone Treatment No    Past Hormone Therapy No      What other  symptoms do you have   Are you Having Heaviness or Tightness No    Are you having Pain No    Is there Decreased scar mobility No      Head and Neck   4 cm superior to sternal notch around neck 40.4 cm    6 cm superior to sternal notch around neck 39.8 cm    8 cm superior to sternal notch around neck 39 cm    Other 28   Under chin tragis to tragis                  Objective measurements completed on examination: See above findings.       [facility] Adult PT Treatment/Exercise - 05/01/20 0001      Manual Therapy   Manual Therapy Edema management;Manual Lymphatic Drainage (MLD)    Edema Management chip pack was made for pt to wear under her chin to decrease swelling/induration    Manual Lymphatic Drainage (MLD) Short neck, stationary circles bilateral axilla, stationary circles front of shoulder toward axilla, short neck again,, stationary circles (river) toward collarbone, then gentle pump from chin working downward toward chest and retrace steps                    PT Short Term Goals - 05/01/20 1639      PT SHORT TERM GOAL #1   Title Independent with chip pack to reduce edema    Time 2    Period Weeks    Status New             PT Long Term Goals - 05/01/20 1639      PT LONG TERM GOAL #1   Title Independent with self MLD to decrease neck lymphedema    Time 6    Period Weeks    Status New    Target Date 06/12/20      PT LONG TERM GOAL #2   Title Pt will be fit for compression garment prn to maintain reduction of swelling    Time 6    Period Weeks    Status New    Target Date 06/12/20                  Plan - 05/01/20 1619    Clinical Impression Statement Pt.developed hoarseness in  January 2021.Pt was diagnosed with malignant neoplasm of glottis through laryngoscopy on 09/02/19.  She had 6 weeks of radiation and developed some post radiation swelling but had to delay treatment for family and friend medical issues.  She has multiple MD appts  and would like just a few visits to learn self MLD.  A chip pack was made today for her to wear 30 min initially up to 4 hours a day as tolerated.  Swollen and indurated area under her chin was improved with MLD today    Personal Factors and Comorbidities Comorbidity 1;Comorbidity 2    Comorbidities radiation, atrial fib    Stability/Clinical Decision Making Stable/Uncomplicated    Clinical Decision Making Low    Rehab Potential Excellent    PT Frequency 1x / week    PT Duration 6 weeks    PT Treatment/Interventions ADLs/Self Care Home Management;Patient/family education;Manual lymph drainage;Manual techniques    PT Next Visit Plan check benefit of chip pack, perform and instruct Self MLD.  Pt wants limited visits    PT Home Exercise Plan continue chip pack if beneficial    Consulted and Agree with Plan of Care Patient           Patient will benefit from skilled therapeutic intervention in order to improve the following deficits and impairments:  Postural dysfunction, Increased edema, Decreased knowledge of precautions  Visit Diagnosis: Secondary lymphedema  Localized edema     Problem List Patient Active Problem List   Diagnosis Date Noted  . Dehydration 10/10/2019  . Malignant neoplasm of glottis ([place]) 09/09/2019  . Leg swelling 05/05/2019  . Hyperthyroidism 12/01/2016  . Dyspnea on exertion 07/30/2015  . NICM (nonischemic cardiomyopathy) ([place]) 07/30/2015  . Right knee pain 05/31/2015  . Rheumatoid arthritis ([place]) 07/21/2014  . Smoker 07/21/2014  . Anticoagulation adequate 06/05/2014  . A-fib ([place]) 11/14/2010  . Reflux 11/14/2010    Elsie Ra [facility] 05/01/2020, 4:42 PM  [place], [place], [address] Phone: [phone]   Fax:  [phone]  Name: Rhonda Marshall MRN: [id number] Date of Birth: 07-May-1951

## 2020-05-06 NOTE — Progress Notes (Signed)
Cardiology Office Note   Date:  05/07/2020   ID:  [name], Rhonda Marshall 05-27-1951, MRN [id number]  PCP:  Timoteo Gaul, FNP  Cardiologist:   No primary care provider on file.   Chief Complaint  Patient presents with  . Atrial Fibrillation      History of Present Illness: Rhonda Marshall is a 69 y.o. female who presents for evaluation of atrial fibrillation. She had DCCV in the past but she went back into atrial fib.  We have managed her with rate control and anticoagulation.    Since I last saw her she was treated for throat cancer with radiation.  She had a PEG tube for a while but this has been removed.  She is recovering from this.  Halloween marks the anniversary of her 70 year old daughter committing suicide.  She also lost a son at a young age.  She has had some other deaths in her family recently and her sister has cancer.  She denies any cardiovascular symptoms.  She does not notice her fibrillation.  She has no presyncope or syncope.  She denies any chest pressure, neck or arm discomfort.  She did stop smoking cigarettes after she was diagnosed with cancer.  She has had no new shortness of breath, PND or orthopnea.   Past Medical History:  Diagnosis Date  . Anxiety   . Atrial fibrillation ([place])    newly diagnosed 10/2010  . Family history of adverse reaction to anesthesia    Sister's BP drops   . Hypertension   . Rheumatoid arthritis(714.0)   . Subclinical hyperthyroidism     Past Surgical History:  Procedure Laterality Date  . Arm surgery     fracture, right arm  . CARDIOVERSION N/A 08/10/2014   Procedure: CARDIOVERSION;  Surgeon: Dorothy Spark, MD;  Location: [facility];  Service: Cardiovascular;  Laterality: N/A;  . CATARACT EXTRACTION    . DIRECT LARYNGOSCOPY N/A 09/02/2019   Procedure: DIRECT LARYNGOSCOPY WITH BIOPSY;  Surgeon: Rozetta Nunnery, MD;  Location: [facility];  Service: ENT;  Laterality: N/A;  . IR GASTROSTOMY  TUBE MOD SED  10/17/2019  . IR GASTROSTOMY TUBE REMOVAL  01/03/2020  . TUBAL LIGATION       Current Outpatient Medications  Medication Sig Dispense Refill  . ALPRAZolam (XANAX) 1 MG tablet Take 1 mg by mouth 3 (three) times daily as needed for anxiety.   0  . diltiazem (CARDIZEM CD) 240 MG 24 hr capsule Take 1 capsule (240 mg total) by mouth daily. OFFICE VISIT NEEDED 90 capsule 3  . ELIQUIS 5 MG TABS tablet Take 1 tablet (5 mg total) by mouth 2 (two) times daily. 180 tablet 1  . Multiple Vitamin (MULTIVITAMIN WITH MINERALS) TABS tablet Take 1 tablet by mouth daily.     Marland Kitchen oxyCODONE-acetaminophen (PERCOCET) 10-325 MG tablet Take 1 tablet by mouth 4 (four) times daily as needed for pain.    Marland Kitchen propylthiouracil (PTU) 50 MG tablet Take 1 tablet (50 mg total) by mouth 2 (two) times daily. 60 tablet 3  . metoprolol (TOPROL XL) 200 MG 24 hr tablet Take 1 tablet (200 mg total) by mouth daily. 90 tablet 3   No current facility-administered medications for this visit.    Allergies:   Clindamycin/lincomycin and Latex    ROS:  Please see the history of present illness.   Otherwise, review of systems are positive for none.   All other systems are reviewed and negative.  PHYSICAL EXAM: VS:  BP (!) 140/92   Pulse (!) 101   Ht 5\' 1"  (1.549 m)   Wt 127 lb 9.6 oz (57.9 kg)   SpO2 99%   BMI 24.11 kg/m  , BMI Body mass index is 24.11 kg/m. GENERAL:  Well appearing NECK:  No jugular venous distention, waveform within normal limits, carotid upstroke brisk and symmetric, no bruits, no thyromegaly LUNGS:  Clear to auscultation bilaterally CHEST:  Unremarkable HEART:  PMI not displaced or sustained,S1 and S2 within normal limits, no S3,  no clicks, no rubs, no murmurs, irregular ABD:  Flat, positive bowel sounds normal in frequency in pitch, no bruits, no rebound, no guarding, no midline pulsatile mass, no hepatomegaly, no splenomegaly EXT:  2 plus pulses throughout, no edema, no cyanosis no  clubbing   EKG:  EKG is ordered today. The ekg ordered today demonstrates atrial fibrillation, interventricular conduction delay, left axis deviation   Recent Labs: 10/17/2019: Hemoglobin 11.2; Platelets 194 10/20/2019: ALT 13; BUN 11; Creatinine 0.92; Magnesium 1.9; Potassium 4.3; Sodium 142 03/27/2020: TSH 0.84    Lipid Panel    Component Value Date/Time   LDLDIRECT 117 (H) 09/20/2008 2239      Wt Readings from Last 3 Encounters:  05/07/20 127 lb 9.6 oz (57.9 kg)  04/03/20 128 lb 12.8 oz (58.4 kg)  11/18/19 123 lb 2 oz (55.8 kg)      Other studies Reviewed: Additional studies/ records that were reviewed today include: Labs. Review of the above records demonstrates:  Please see elsewhere in the note.     ASSESSMENT AND PLAN:  ATRIAL FIB:     [name] has a CHA2DS2 - VASc score of 2.    She is up-to-date with blood work and was mildly anemic with her radiation therapy.  I am going to be changing her medications.  Somehow she started metoprolol succinate 200 mg twice a day.  I am going to reduce that to 200 mg once daily.  To make sure she has good rate control I am going to increase her Cardizem to 240 mg daily.   TOBACCO ABUSE:    She quit smoking  HYPERTHYROID:     Her thyroid was followed by Dr. Dwyane Dee and is normal.  No change in therapy.   CARDIOMYOPATHY:    She has had a mildly reduced ejection fraction.  She is euvolemic.  No change in therapy.  EDEMA:  She has no lower extremity swelling and warmth.  Current medicines are reviewed at length with the patient today.  The patient does not have concerns regarding medicines.  The following changes have been made: As above  Labs/ tests ordered today include: None  Orders Placed This Encounter  Procedures  . EKG 12-Lead     Disposition:   FU with 1 year   Signed, Minus Breeding, MD  05/07/2020 7:12 PM    [facility]

## 2020-05-07 ENCOUNTER — Other Ambulatory Visit: Payer: Self-pay

## 2020-05-07 ENCOUNTER — Ambulatory Visit (INDEPENDENT_AMBULATORY_CARE_PROVIDER_SITE_OTHER): Payer: Medicare Other | Admitting: Cardiology

## 2020-05-07 ENCOUNTER — Encounter: Payer: Self-pay | Admitting: Cardiology

## 2020-05-07 VITALS — BP 140/92 | HR 101 | Ht 61.0 in | Wt 127.6 lb

## 2020-05-07 DIAGNOSIS — M7989 Other specified soft tissue disorders: Secondary | ICD-10-CM

## 2020-05-07 DIAGNOSIS — I429 Cardiomyopathy, unspecified: Secondary | ICD-10-CM | POA: Diagnosis not present

## 2020-05-07 DIAGNOSIS — Z720 Tobacco use: Secondary | ICD-10-CM

## 2020-05-07 DIAGNOSIS — I482 Chronic atrial fibrillation, unspecified: Secondary | ICD-10-CM | POA: Diagnosis not present

## 2020-05-07 MED ORDER — METOPROLOL SUCCINATE ER 200 MG PO TB24: 200.0000 mg | ORAL_TABLET | Freq: Every day | ORAL | 3 refills | Status: DC

## 2020-05-07 MED ORDER — DILTIAZEM HCL ER COATED BEADS 240 MG PO CP24: 240.0000 mg | ORAL_CAPSULE | Freq: Every day | ORAL | 3 refills | Status: DC

## 2020-05-07 NOTE — Patient Instructions (Signed)
Medication Instructions:  TAKE METOPROLOL SUCCINATE (TOPROL XL) 200 MG TABLET ONCE A DAY INCREASE YOUR CARDIZEM TO 240 MG ONCE A DAY *If you need a refill on your cardiac medications before your next appointment, please call your pharmacy*   Lab Work: None ordered If you have labs (blood work) drawn today and your tests are completely normal, you will receive your results only by: Marland Kitchen MyChart Message (if you have MyChart) OR . A paper copy in the mail If you have any lab test that is abnormal or we need to change your treatment, we will call you to review the results.   Testing/Procedures: None ordered   Follow-Up: At [facility], you and your health needs are our priority.  As part of our continuing mission to provide you with exceptional heart care, we have created designated Provider Care Teams.  These Care Teams include your primary Cardiologist (physician) and Advanced Practice Providers (APPs -  Physician Assistants and Nurse Practitioners) who all work together to provide you with the care you need, when you need it.  We recommend signing up for the patient portal called "MyChart".  Sign up information is provided on this After Visit Summary.  MyChart is used to connect with patients for Virtual Visits (Telemedicine).  Patients are able to view lab/test results, encounter notes, upcoming appointments, etc.  Non-urgent messages can be sent to your provider as well.   To learn more about what you can do with MyChart, go to [email or web address].    Your next appointment:   12 month(s)  The format for your next appointment:   In Person  Provider:   Minus Breeding, MD   Other Instructions None

## 2020-05-08 ENCOUNTER — Ambulatory Visit: Payer: Medicare Other

## 2020-06-01 ENCOUNTER — Other Ambulatory Visit (INDEPENDENT_AMBULATORY_CARE_PROVIDER_SITE_OTHER): Payer: Medicare Other

## 2020-06-01 ENCOUNTER — Other Ambulatory Visit: Payer: Self-pay

## 2020-06-01 DIAGNOSIS — E0520 Thyrotoxicosis with toxic multinodular goiter without thyrotoxic crisis or storm: Secondary | ICD-10-CM | POA: Diagnosis not present

## 2020-06-01 LAB — TSH: TSH: 0.88 u[IU]/mL (ref 0.35–4.50)

## 2020-06-01 LAB — CBC
HCT: 34.8 % — ABNORMAL LOW (ref 36.0–46.0)
Hemoglobin: 11.6 g/dL — ABNORMAL LOW (ref 12.0–15.0)
MCHC: 33.3 g/dL (ref 30.0–36.0)
MCV: 96.1 fl (ref 78.0–100.0)
Platelets: 333 10*3/uL (ref 150.0–400.0)
RBC: 3.62 Mil/uL — ABNORMAL LOW (ref 3.87–5.11)
RDW: 14.6 % (ref 11.5–15.5)
WBC: 11.6 10*3/uL — ABNORMAL HIGH (ref 4.0–10.5)

## 2020-06-01 LAB — T3, FREE: T3, Free: 3.2 pg/mL (ref 2.3–4.2)

## 2020-06-01 LAB — T4, FREE: Free T4: 0.77 ng/dL (ref 0.60–1.60)

## 2020-06-04 ENCOUNTER — Other Ambulatory Visit: Payer: Self-pay

## 2020-06-04 ENCOUNTER — Encounter: Payer: Self-pay | Admitting: Endocrinology

## 2020-06-04 ENCOUNTER — Ambulatory Visit (INDEPENDENT_AMBULATORY_CARE_PROVIDER_SITE_OTHER): Payer: Medicare Other | Admitting: Endocrinology

## 2020-06-04 VITALS — BP 134/86 | HR 62 | Ht 61.0 in | Wt 128.0 lb

## 2020-06-04 DIAGNOSIS — E0520 Thyrotoxicosis with toxic multinodular goiter without thyrotoxic crisis or storm: Secondary | ICD-10-CM | POA: Diagnosis not present

## 2020-06-04 NOTE — Progress Notes (Signed)
patient ID: Rhonda Marshall, female   DOB: [birth year], 69 y.o.   MRN: [id number]                                                                                                                Reason for Appointment:  Hyperthyroidism, follow-up   Chief complaint: Follow-up for overactive thyroid   History of Present Illness:   Review of her records for thyroid History indicates that she reportedly had subclinical hyperthyroidism in 2012 when she was admitted for new onset atrial fibrillation Subsequently did not have any treatment or further evaluation She has been treated for atrial fibrillation by her cardiologist and was recommended evaluation for the thyroid recently  The patient is a poor historian and on her initial visit she did not give any symptoms of palpitations, shakiness, feeling excessively warm and sweaty or weight loss.  She did complain of nervousness and fatigue.   However she has also had issues with anxiety and depression  Since her baseline TSH was very low along with upper normal free T3 she has been on PTU 50 mg twice a day since October 2017 Baseline thyrotropin receptor antibody was negative With starting treatment she had less fatigue symptoms  She was scheduled for I-131 treatment because of the finding of a large hot nodule on the left side  However she cannot do this since she was not able to be in isolation for a couple of days at home   RECENT history:   In 03/2017 when she was off her antithyroid drugs she was not having any symptoms like palpitations, however she was having some fatigue and shortness of breath and was having more problems with her tachycardia Her free T3 was up above normal at 4.3 at the same time  She has been treated with PTU 50 mg, the dose ranging from 1 up to 2 a day  She was again told to increase her PTU to twice a day instead of once a day on her visit in 09/2019 She says after increasing the dose she felt better with her  energy level and appetite  Her dose has been continued at 50 mg twice daily on her last visit in 9/21 and she is taking this regularly Her weight has leveled off, she is having a reasonably good appetite and is not complaining of shortness of breath or fatigue  Her thyroid levels are consistently normal with stable free T4, free T3 and TSH   She still does not think she can do radioactive iodine treatment since she lives in a small house with grandchildren and only 1 bathroom is available  Wt Readings from Last 3 Encounters:  06/04/20 128 lb (58.1 kg)  05/07/20 127 lb 9.6 oz (57.9 kg)  04/03/20 128 lb 12.8 oz (58.4 kg)      Thyroid function tests as follows:     Lab Results  Component Value Date   FREET4 0.77 06/01/2020   FREET4 0.61 03/27/2020   FREET4 0.82 10/05/2019   T3FREE 3.2  06/01/2020   T3FREE 3.7 03/27/2020   T3FREE 4.8 (H) 10/05/2019   TSH 0.88 06/01/2020   TSH 0.84 03/27/2020   TSH 0.26 (L) 10/05/2019    Lab Results  Component Value Date   THYROTRECAB <0.50 12/24/2016     Allergies as of 06/04/2020      Reactions   Clindamycin/lincomycin Rash   Latex Rash   Latex gloves      Medication List       Accurate as of June 04, 2020  3:17 PM. If you have any questions, ask your nurse or doctor.        ALPRAZolam 1 MG tablet Commonly known as: XANAX Take 1 mg by mouth 3 (three) times daily as needed for anxiety.   diltiazem 240 MG 24 hr capsule Commonly known as: CARDIZEM CD Take 1 capsule (240 mg total) by mouth daily. OFFICE VISIT NEEDED   Eliquis 5 MG Tabs tablet Generic drug: apixaban Take 1 tablet (5 mg total) by mouth 2 (two) times daily.   metoprolol 200 MG 24 hr tablet Commonly known as: Toprol XL Take 1 tablet (200 mg total) by mouth daily.   multivitamin with minerals Tabs tablet Take 1 tablet by mouth daily.   oxyCODONE-acetaminophen 10-325 MG tablet Commonly known as: PERCOCET Take 1 tablet by mouth 4 (four) times daily as  needed for pain.   propylthiouracil 50 MG tablet Commonly known as: PTU Take 1 tablet (50 mg total) by mouth 2 (two) times daily.           Past Medical History:  Diagnosis Date  . Anxiety   . Atrial fibrillation ([place])    newly diagnosed 10/2010  . Family history of adverse reaction to anesthesia    Sister's BP drops   . Hypertension   . Rheumatoid arthritis(714.0)   . Subclinical hyperthyroidism     Past Surgical History:  Procedure Laterality Date  . Arm surgery     fracture, right arm  . CARDIOVERSION N/A 08/10/2014   Procedure: CARDIOVERSION;  Surgeon: Dorothy Spark, MD;  Location: [facility];  Service: Cardiovascular;  Laterality: N/A;  . CATARACT EXTRACTION    . DIRECT LARYNGOSCOPY N/A 09/02/2019   Procedure: DIRECT LARYNGOSCOPY WITH BIOPSY;  Surgeon: Rozetta Nunnery, MD;  Location: [facility];  Service: ENT;  Laterality: N/A;  . IR GASTROSTOMY TUBE MOD SED  10/17/2019  . IR GASTROSTOMY TUBE REMOVAL  01/03/2020  . TUBAL LIGATION      Family History  Problem Relation Age of Onset  . Heart attack Mother 69       pacemaker age 25  . Hypothyroidism Mother   . Cancer Father        colon and prostate  . Diabetes Brother   . CAD Brother 45  . Cancer Brother        Renal  . Hypothyroidism Sister     Social History:  reports that she quit smoking about 7 months ago. Her smoking use included cigarettes. She started smoking about 54 years ago. She has a 13.50 pack-year smoking history. She has never used smokeless tobacco. She reports previous alcohol use. She reports that she does not use drugs.  Allergies:  Allergies  Allergen Reactions  . Clindamycin/Lincomycin Rash  . Latex Rash    Latex gloves     Review of Systems  She has chronic atrial fibrillation followed by cardiologist  She is on metoprolol 200 mg twice daily for heart rate control followed by cardiologist  Examination:   BP 134/86   Pulse 62   Ht 5\' 1"  (1.549 m)    Wt 128 lb (58.1 kg)   SpO2 97%   BMI 24.19 kg/m     Assessment/Plan:   Hyperthyroidism, mild and caused by hot nodule on the left side  Previously on exam has had a multinodular goiter, this is not as well palpable recently  Her symptoms with the hyperthyroidism at baseline had been nonspecific in the form of mild fatigue and exertional shortness of breath Also has history of atrial fibrillation  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 Because of difficulty doing I-131 treatment she is continuing on 50 mg PTU, twice a day With this her thyroid levels are better controlled  Again discussed that if she is able to do I-131 treatment and have some isolation at home she will not have to take medications long-term Currently she is not able to do this  Again discussed in detail the need for Covid vaccination which she has refused despite previous counseling and giving her patient handouts She also refuses the flu shot  Elayne Snare 06/04/2020, 3:17 PM     Note: This office note was prepared with Dragon voice recognition system technology. Any transcriptional errors that result from this process are unintentional.

## 2020-06-04 NOTE — Patient Instructions (Signed)
Stay on PTU 2x daily

## 2020-06-18 ENCOUNTER — Other Ambulatory Visit: Payer: Self-pay | Admitting: Cardiology

## 2020-06-26 ENCOUNTER — Other Ambulatory Visit: Payer: Self-pay

## 2020-06-26 ENCOUNTER — Ambulatory Visit (INDEPENDENT_AMBULATORY_CARE_PROVIDER_SITE_OTHER): Payer: Medicare Other | Admitting: Otolaryngology

## 2020-06-26 VITALS — Temp 97.9°F

## 2020-06-26 DIAGNOSIS — Z8521 Personal history of malignant neoplasm of larynx: Secondary | ICD-10-CM | POA: Diagnosis not present

## 2020-06-26 NOTE — Progress Notes (Signed)
HPI: Rhonda Marshall is a 69 y.o. 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 She is doing well with no specific complaints. Her voice is doing better..  Past Medical History:  Diagnosis Date  . Anxiety   . Atrial fibrillation ([place])    newly diagnosed 10/2010  . Family history of adverse reaction to anesthesia    Sister's BP drops   . Hypertension   . Rheumatoid arthritis(714.0)   . Subclinical hyperthyroidism    Past Surgical History:  Procedure Laterality Date  . Arm surgery     fracture, right arm  . CARDIOVERSION N/A 08/10/2014   Procedure: CARDIOVERSION;  Surgeon: Dorothy Spark, MD;  Location: [place] [facility];  Service: Cardiovascular;  Laterality: N/A;  . CATARACT EXTRACTION    . DIRECT LARYNGOSCOPY N/A 09/02/2019   Procedure: DIRECT LARYNGOSCOPY WITH BIOPSY;  Surgeon: Rozetta Nunnery, MD;  Location: [facility];  Service: ENT;  Laterality: N/A;  . IR GASTROSTOMY TUBE MOD SED  10/17/2019  . IR GASTROSTOMY TUBE REMOVAL  01/03/2020  . TUBAL LIGATION     Social History   Socioeconomic History  . Marital status: Widowed    Spouse name: Not on file  . Number of children: 1  . Years of education: Not on file  . Highest education level: Not on file  Occupational History  . Occupation: laborer    Comment: Warehouse manager  Tobacco Use  . Smoking status: Former Smoker    Packs/day: 0.25    Years: 54.00    Pack years: 13.50    Types: Cigarettes    Start date: 49    Quit date: 10/2019    Years since quitting: 0.7  . Smokeless tobacco: Never Used  Vaping Use  . Vaping Use: Never used  Substance and Sexual Activity  . Alcohol use: Not Currently    Alcohol/week: 0.0 standard drinks  . Drug use: No  . Sexual activity: Not on file  Other Topics Concern  . Not on file  Social History Narrative  . Not on file   Social Determinants of  Health   Financial Resource Strain: Not on file  Food Insecurity: Not on file  Transportation Needs: Not on file  Physical Activity: Not on file  Stress: Not on file  Social Connections: Not on file   Family History  Problem Relation Age of Onset  . Heart attack Mother 22       pacemaker age 25  . Hypothyroidism Mother   . Cancer Father        colon and prostate  . Diabetes Brother   . CAD Brother 41  . Cancer Brother        Renal  . Hypothyroidism Sister    Allergies  Allergen Reactions  . Clindamycin/Lincomycin Rash  . Latex Rash    Latex gloves   Prior to Admission medications   Medication Sig Start Date End Date Taking? Authorizing Provider  ALPRAZolam Duanne Moron) 1 MG tablet Take 1 mg by mouth 3 (three) times daily as needed for anxiety.  05/11/14  Yes [provider]  diltiazem (CARDIZEM CD) 240 MG 24 hr capsule Take 1 capsule (240 mg total) by mouth daily. OFFICE VISIT NEEDED 05/07/20  Yes Minus Breeding, MD  ELIQUIS 5 MG TABS tablet Take 1 tablet (5 mg total) by mouth 2 (two) times daily. 06/18/20  Yes Minus Breeding, MD  metoprolol (TOPROL XL) 200 MG 24 hr tablet Take 1 tablet (200 mg total) by mouth daily. 05/07/20  Yes Minus Breeding, MD  Multiple Vitamin (MULTIVITAMIN WITH MINERALS) TABS tablet Take 1 tablet by mouth daily.    Yes [provider]  oxyCODONE-acetaminophen (PERCOCET) 10-325 MG tablet Take 1 tablet by mouth 4 (four) times daily as needed for pain.   Yes [provider]  propylthiouracil (PTU) 50 MG tablet Take 1 tablet (50 mg total) by mouth 2 (two) times daily. 04/24/20  Yes Elayne Snare, MD     Positive ROS: Otherwise negative  All other systems have been reviewed and were otherwise negative with the exception of those mentioned in the HPI and as above.  Physical Exam: Constitutional: Alert, well-appearing, no acute distress Ears: External ears without lesions or tenderness. Ear canals are clear bilaterally with intact,  clear TMs.  Nasal: External nose without lesions.. Clear nasal passages Oral: Lips and gums without lesions. Tongue and palate mucosa without lesions. Posterior oropharynx clear. Fiberoptic laryngoscopy was performed to the left nostril. The nasopharynx was clear. The base of tongue vallecula and epiglottis were normal. Supraglottis was slightly edematous. But the vocal cords were clear bilaterally with normal vocal mobility bilaterally. Neck: No palpable adenopathy or masses. No palpable adenopathy on either side of the neck. Respiratory: Breathing comfortably  Skin: No facial/neck lesions or rash noted.  Laryngoscopy  Date/Time: 06/26/2020 1:17 PM Performed by: Rozetta Nunnery, MD Authorized by: Rozetta Nunnery, MD   Consent:    Consent obtained:  Verbal   Consent given by:  Patient Procedure details:    Indications: oncologic surveillance follow-up     Medication:  Afrin   Instrument: flexible fiberoptic laryngoscope     Scope location: left nare   Sinus:    Left nasopharynx: normal   Mouth:    Oropharynx: normal     Vallecula: normal     Base of tongue: normal     Epiglottis: normal   Throat:    Pyriform sinus: normal     True vocal cords: normal   Comments:     Vocal cords are clear bilaterally with normal vocal cord mobility. She has mild supraglottic edema.    Assessment: History of a T1N0 left true vocal cord cancer status post completion of radiation therapy 7 months ago. No evidence of persistent disease on clinical exam.  Plan: She will follow-up in 6 to 7 months for recheck. She has appointment scheduled with Dr. Isidore Moos next March.   Radene Journey, MD

## 2020-08-14 ENCOUNTER — Other Ambulatory Visit: Payer: Self-pay | Admitting: Endocrinology

## 2020-09-03 ENCOUNTER — Other Ambulatory Visit (INDEPENDENT_AMBULATORY_CARE_PROVIDER_SITE_OTHER): Payer: 59

## 2020-09-03 ENCOUNTER — Other Ambulatory Visit: Payer: Self-pay

## 2020-09-03 ENCOUNTER — Other Ambulatory Visit: Payer: Medicare Other

## 2020-09-03 DIAGNOSIS — E0520 Thyrotoxicosis with toxic multinodular goiter without thyrotoxic crisis or storm: Secondary | ICD-10-CM

## 2020-09-03 LAB — TSH: TSH: 0.35 u[IU]/mL (ref 0.35–4.50)

## 2020-09-03 LAB — CBC
HCT: 37.1 % (ref 36.0–46.0)
Hemoglobin: 12.5 g/dL (ref 12.0–15.0)
MCHC: 33.8 g/dL (ref 30.0–36.0)
MCV: 94.8 fl (ref 78.0–100.0)
Platelets: 377 10*3/uL (ref 150.0–400.0)
RBC: 3.91 Mil/uL (ref 3.87–5.11)
RDW: 15.3 % (ref 11.5–15.5)
WBC: 12.5 10*3/uL — ABNORMAL HIGH (ref 4.0–10.5)

## 2020-09-03 LAB — T3, FREE: T3, Free: 4.4 pg/mL — ABNORMAL HIGH (ref 2.3–4.2)

## 2020-09-03 LAB — T4, FREE: Free T4: 0.86 ng/dL (ref 0.60–1.60)

## 2020-09-06 ENCOUNTER — Ambulatory Visit (INDEPENDENT_AMBULATORY_CARE_PROVIDER_SITE_OTHER): Payer: 59 | Admitting: Endocrinology

## 2020-09-06 ENCOUNTER — Encounter: Payer: Self-pay | Admitting: Endocrinology

## 2020-09-06 VITALS — BP 136/82 | HR 96 | Ht 61.0 in | Wt 119.0 lb

## 2020-09-06 DIAGNOSIS — E0520 Thyrotoxicosis with toxic multinodular goiter without thyrotoxic crisis or storm: Secondary | ICD-10-CM | POA: Diagnosis not present

## 2020-09-06 MED ORDER — PROPYLTHIOURACIL 50 MG PO TABS: 50.0000 mg | ORAL_TABLET | Freq: Three times a day (TID) | ORAL | 1 refills | Status: DC

## 2020-09-06 NOTE — Progress Notes (Signed)
patient ID: Rhonda Marshall, female   DOB: 07/26/1950, 70 y.o.   MRN: [id number]                                                                                                                Reason for Appointment:  Hyperthyroidism, follow-up   Chief complaint: Follow-up for overactive thyroid   History of Present Illness:   Review of her records for thyroid History indicates that she reportedly had subclinical hyperthyroidism in 2012 when she was admitted for new onset atrial fibrillation Subsequently did not have any treatment or further evaluation She has been treated for atrial fibrillation by her cardiologist and was recommended evaluation for the thyroid recently  The patient is a poor historian and on her initial visit she did not give any symptoms of palpitations, shakiness, feeling excessively warm and sweaty or weight loss.  She did complain of nervousness and fatigue.   However she has also had issues with anxiety and depression  Since her baseline TSH was very low along with upper normal free T3 she has been on PTU 50 mg twice a day since October 2017 Baseline thyrotropin receptor antibody was negative With starting treatment she had less fatigue symptoms  She was scheduled for I-131 treatment because of the finding of a large hot nodule on the left side  However she cannot do this since she was not able to be in isolation for a couple of days at home   RECENT history:   In 03/2017 when she was off her antithyroid drugs she was not having any symptoms like palpitations, however she was having some fatigue and shortness of breath and was having more problems with her tachycardia Her free T3 was up above normal at 4.3 at the same time  She has been treated with PTU 50 mg, the dose ranging from 1 up to 2 a day  She was told to increase her PTU to twice a day instead of once a day on her visit in 09/2019 She says after increasing the dose she felt better with her energy  level and appetite  Her dose has been continued at 50 mg twice daily Although her weight has leveled off she is down about 9 pounds now and likely not eating consistently, difficult to get a good history from her today She does not think she has any palpitations, anxiety or heat intolerance but does appear to have some complaints of fatigue  Her thyroid levels are showing a low normal TSH but her free T4 is high  She still does not think she can do radioactive iodine treatment since she lives in a small house with grandchildren and only 1 bathroom is available  Wt Readings from Last 3 Encounters:  09/06/20 119 lb (54 kg)  06/04/20 128 lb (58.1 kg)  05/07/20 127 lb 9.6 oz (57.9 kg)      Thyroid function tests as follows:     Lab Results  Component Value Date   FREET4 0.86 09/03/2020   FREET4  0.77 06/01/2020   FREET4 0.61 03/27/2020   T3FREE 4.4 (H) 09/03/2020   T3FREE 3.2 06/01/2020   T3FREE 3.7 03/27/2020   TSH 0.35 09/03/2020   TSH 0.88 06/01/2020   TSH 0.84 03/27/2020    Lab Results  Component Value Date   THYROTRECAB <0.50 12/24/2016     Allergies as of 09/06/2020      Reactions   Clindamycin/lincomycin Rash   Latex Rash   Latex gloves      Medication List       Accurate as of September 06, 2020  4:09 PM. If you have any questions, ask your nurse or doctor.        ALPRAZolam 1 MG tablet Commonly known as: XANAX Take 1 mg by mouth 3 (three) times daily as needed for anxiety.   diltiazem 240 MG 24 hr capsule Commonly known as: CARDIZEM CD Take 1 capsule (240 mg total) by mouth daily. OFFICE VISIT NEEDED   Eliquis 5 MG Tabs tablet Generic drug: apixaban Take 1 tablet (5 mg total) by mouth 2 (two) times daily.   metoprolol 200 MG 24 hr tablet Commonly known as: Toprol XL Take 1 tablet (200 mg total) by mouth daily.   multivitamin with minerals Tabs tablet Take 1 tablet by mouth daily.   oxyCODONE-acetaminophen 10-325 MG tablet Commonly known as:  PERCOCET Take 1 tablet by mouth 4 (four) times daily as needed for pain.   propylthiouracil 50 MG tablet Commonly known as: PTU Take 1 tablet (50 mg total) by mouth 2 (two) times daily.           Past Medical History:  Diagnosis Date  . Anxiety   . Atrial fibrillation ([place])    newly diagnosed 10/2010  . Family history of adverse reaction to anesthesia    Sister's BP drops   . Hypertension   . Rheumatoid arthritis(714.0)   . Subclinical hyperthyroidism     Past Surgical History:  Procedure Laterality Date  . Arm surgery     fracture, right arm  . CARDIOVERSION N/A 08/10/2014   Procedure: CARDIOVERSION;  Surgeon: Dorothy Spark, MD;  Location: [facility];  Service: Cardiovascular;  Laterality: N/A;  . CATARACT EXTRACTION    . DIRECT LARYNGOSCOPY N/A 09/02/2019   Procedure: DIRECT LARYNGOSCOPY WITH BIOPSY;  Surgeon: Rozetta Nunnery, MD;  Location: [facility];  Service: ENT;  Laterality: N/A;  . IR GASTROSTOMY TUBE MOD SED  10/17/2019  . IR GASTROSTOMY TUBE REMOVAL  01/03/2020  . TUBAL LIGATION      Family History  Problem Relation Age of Onset  . Heart attack Mother 37       pacemaker age 82  . Hypothyroidism Mother   . Cancer Father        colon and prostate  . Diabetes Brother   . CAD Brother 68  . Cancer Brother        Renal  . Hypothyroidism Sister     Social History:  reports that she quit smoking about 10 months ago. Her smoking use included cigarettes. She started smoking about 55 years ago. She has a 13.50 pack-year smoking history. She has never used smokeless tobacco. She reports previous alcohol use. She reports that she does not use drugs.  Allergies:  Allergies  Allergen Reactions  . Clindamycin/Lincomycin Rash  . Latex Rash    Latex gloves     Review of Systems  She has chronic atrial fibrillation followed by cardiologist  She is on metoprolol 200  mg twice daily for heart rate control followed by cardiologist    Examination:   BP 136/82   Pulse 96   Ht 5\' 1"  (1.549 m)   Wt 119 lb (54 kg)   SpO2 98%   BMI 22.48 kg/m   Thyroid is not palpable No tremor present   Assessment/Plan:   Hyperthyroidism, mild and caused by hot nodule on the left side  Previously on exam has had a multinodular goiter, this is not palpable today  Her symptoms with the hyperthyroidism at baseline had been nonspecific in the form of mild fatigue and exertional shortness of breath Also has history of atrial fibrillation Recently however has had unexpected weight loss  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 Any transcriptional errors that result from this process are unintentional.

## 2020-09-06 NOTE — Patient Instructions (Signed)
Take 3 pills daily

## 2020-09-25 ENCOUNTER — Ambulatory Visit
Admission: RE | Admit: 2020-09-25 | Discharge: 2020-09-25 | Disposition: A | Discharge status: Home / Self Care | Payer: 59 | Source: Ambulatory Visit | Attending: Radiation Oncology | Admitting: Radiation Oncology

## 2020-09-25 ENCOUNTER — Other Ambulatory Visit: Payer: Self-pay

## 2020-09-25 ENCOUNTER — Encounter: Payer: Self-pay | Admitting: Radiation Oncology

## 2020-09-25 VITALS — BP 145/89 | HR 78 | Temp 98.4°F | Resp 19 | Wt 125.0 lb

## 2020-09-25 DIAGNOSIS — Z8521 Personal history of malignant neoplasm of larynx: Secondary | ICD-10-CM | POA: Insufficient documentation

## 2020-09-25 DIAGNOSIS — C320 Malignant neoplasm of glottis: Secondary | ICD-10-CM

## 2020-09-25 DIAGNOSIS — Z923 Personal history of irradiation: Secondary | ICD-10-CM | POA: Insufficient documentation

## 2020-09-25 MED ORDER — OXYMETAZOLINE HCL 0.05 % NA SOLN: 1.0000 | Freq: Two times a day (BID) | NASAL | Status: DC

## 2020-09-25 MED ORDER — OXYMETAZOLINE HCL 0.05 % NA SOLN
1.0000 | Freq: Two times a day (BID) | NASAL | Status: DC
  Administered 2020-09-25: 1 via NASAL
  Filled 2020-09-25: qty 30

## 2020-09-25 MED ORDER — AFRIN NASAL SPRAY 0.05 % NA SOLN: 1.0000 | Freq: Two times a day (BID) | NASAL | 0 refills | Status: DC

## 2020-09-25 NOTE — Addendum Note (Signed)
Encounter addended by: Zola Button, RN on: 09/25/2020 3:30 PM  Actions taken: MAR administration accepted

## 2020-09-25 NOTE — Progress Notes (Signed)
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 Reports Rhonda Marshall can eat a wide variety of foods and beverages without difficulty Smoking or chewing tobacco? None Using fluoride trays daily? N/A Last ENT visit was on: 06/26/2020 Saw Dr. [name]: Procedure details:    Indications: oncologic surveillance follow-up     Medication:  Afrin   Instrument: flexible fiberoptic laryngoscope     Scope location: left nare   Sinus:    Left nasopharynx: normal   Mouth:    Oropharynx: normal     Vallecula: normal     Base of tongue: normal     Epiglottis: normal   Throat:    Pyriform sinus: normal     True vocal cords: normal   --Comments:     Vocal cords are clear bilaterally with normal vocal cord mobility. Rhonda Marshall has mild supraglottic edema. 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 No evidence of persistent disease on clinical exam. --Plan: Rhonda Marshall will follow-up in 6 to 7 months for recheck. Rhonda Marshall has appointment scheduled with Dr. Isidore Moos next March.  Other notable issues, if any: Rhonda Marshall denies any ear or jaw pain, or difficulty opening Rhonda Marshall mouth fully. Reports Rhonda Marshall endocrinologist recently increased one of Rhonda Marshall medications to help with thyroid function/swelling. Reports occasional dry mouth and thick saliva, but states Rhonda Marshall manages with sugar free candy. Reports Rhonda Marshall sense of taste has returned. Reports improvement in energy since increasing thyroid medication, and states Rhonda Marshall sleeps well through the night. Overall Rhonda Marshall states Rhonda Marshall feels good, and  is pleased with Rhonda Marshall recovery thus far.  Vitals:   09/25/20 1407  BP: (!) 145/89  Pulse: 78  Resp: 19  Temp: 98.4 F (36.9 C)  SpO2: 99%

## 2020-09-25 NOTE — Progress Notes (Addendum)
Radiation Oncology         [phone] ________________________________  Name: Rhonda Marshall MRN: [id number]  Date: 09/25/2020  DOB: [birth year]  Follow-Up Visit Note  CC: Timoteo Gaul, FNP  Rozetta Nunnery, *  Diagnosis and Prior Radiotherapy:       ICD-10-CM   1. Malignant neoplasm of glottis ([place])  C32.0    Cancer Staging Malignant neoplasm of glottis ([place]) Staging form: Larynx - Glottis, AJCC 8th Edition - Clinical stage from 09/09/2019: Stage I (cT1b, cN0, cM0) - Signed by Eppie Gibson, MD on 09/09/2019 Stage prefix: Initial diagnosis   Intent: Curative  Radiation Treatment Dates: 09/22/2019 through 10/31/2019  Site Technique Total Dose (Gy) Dose per Fx (Gy) Completed Fx Beam Energies  Larynx: HN_larynx 3D 63/63 2.25 28/28 6X   CHIEF COMPLAINT:  Here for follow-up and surveillance of glottic cancer  Narrative:    Ms. 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 Reports she can eat a wide variety of foods and beverages without difficulty Smoking or chewing tobacco? None Using fluoride trays daily? N/A Last ENT visit was on: 06/26/2020 Saw Dr. [name]: Procedure details:    Indications: oncologic surveillance follow-up     Medication:  Afrin   Instrument: flexible fiberoptic laryngoscope     Scope location: left nare   Sinus:    Left nasopharynx: normal   Mouth:    Oropharynx: normal     Vallecula: normal     Base of tongue: normal     Epiglottis: normal   Throat:    Pyriform sinus: normal     True vocal cords: normal   --Comments:     Vocal cords are clear bilaterally with normal vocal cord  mobility. She has mild supraglottic edema. 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 No evidence of persistent disease on clinical exam. --Plan: She will follow-up in 6 to 7 months for recheck. She has appointment scheduled with Dr. Isidore Moos next March.  Other notable issues, if any: Patient denies any ear or jaw pain, or difficulty opening her mouth fully. Reports her endocrinologist recently increased one of her medications to help with thyroid function/swelling. Reports occasional dry mouth and thick saliva, but states she manages with sugar free candy. Reports her sense of taste has returned. Reports improvement in energy since increasing thyroid medication, and states she sleeps well through the night. Overall she states she feels good, and is pleased with her recovery thus far.  Vitals:   09/25/20 1407  BP: (!) 145/89  Pulse: 78  Resp: 19  Temp: 98.4 F (36.9 C)  SpO2: 99%       ALLERGIES:  is allergic to clindamycin/lincomycin and latex.  Meds: Current Outpatient Medications  Medication Sig Dispense Refill  . ALPRAZolam (XANAX) 1 MG tablet Take 1 mg by mouth 3 (three) times daily as needed for anxiety.   0  . diltiazem (CARDIZEM CD) 240 MG 24 hr capsule Take 1 capsule (240 mg total) by mouth daily. OFFICE VISIT NEEDED 90 capsule 3  . ELIQUIS 5 MG TABS  tablet Take 1 tablet (5 mg total) by mouth 2 (two) times daily. 180 tablet 3  . metoprolol (TOPROL XL) 200 MG 24 hr tablet Take 1 tablet (200 mg total) by mouth daily. 90 tablet 3  . Multiple Vitamin (MULTIVITAMIN WITH MINERALS) TABS tablet Take 1 tablet by mouth daily.     Marland Kitchen oxyCODONE-acetaminophen (PERCOCET) 10-325 MG tablet Take 1 tablet by mouth 4 (four) times daily as needed for pain.    Marland Kitchen propylthiouracil (PTU) 50 MG tablet Take 1 tablet (50 mg total) by mouth 3 (three) times daily. 270 tablet 1   No current facility-administered medications for this  encounter.    Physical Findings: The patient is in no acute distress. Patient is alert and oriented. Wt Readings from Last 3 Encounters:  09/25/20 125 lb (56.7 kg)  09/06/20 119 lb (54 kg)  06/04/20 128 lb (58.1 kg)    weight is 125 lb (56.7 kg). Her oral temperature is 98.4 F (36.9 C). Her blood pressure is 145/89 (abnormal) and her pulse is 78. Her respiration is 19 and oxygen saturation is 99%. .  General: Alert and oriented, in no acute distress, hoarseness has improved dramatically HEENT: Head is normocephalic. Extraocular movements are intact.   Neck:  well-healed skin with no palpable lymphadenopathy; + mild anterior lymphedema Skin: Skin over neck is warm, dry, intact, mild pigment changes in RT field Psychiatric: Judgment and insight are intact. Affect is appropriate.  PROCEDURE NOTE: After obtaining consent and anesthetizing the nasal cavity with topical decongestant, the flexible endoscope was introduced and passed through the nasal cavity.  No visible lesions in the nasopharynx, pharynx, hypopharynx, or supraglottic larynx.  The true vocal cords are symmetrically mobile without any lesions.   Lab Findings: Lab Results  Component Value Date   WBC 12.5 (H) 09/03/2020   HGB 12.5 09/03/2020   HCT 37.1 09/03/2020   MCV 94.8 09/03/2020   PLT 377.0 09/03/2020    Lab Results  Component Value Date   TSH 0.35 09/03/2020    Radiographic Findings: No results found.  Impression/Plan: This is a delightful woman with a history of glottic cancer, status post radiation therapy.  No evidence of disease on laryngoscopy today.   1) Head and Neck Cancer Status: As above  2) Nutritional Status: gaining weight, doing well   3) Risk Factors: The patient has been educated about risk factors including alcohol and tobacco abuse; they understand that avoidance of alcohol and tobacco is important to prevent recurrences as well as other cancers - abstaining from smoking  4) Swallowing:  Good function, continue speech-language pathology exercises.  5)  Thyroid function: follows w/ endocrinology Lab Results  Component Value Date   TSH 0.35 09/03/2020    6)  Follow-up in 8 months - sooner if needed  On date of service, in total, I spent 30 minutes on this encounter.  She was seen in person.   _____________________________________   Eppie Gibson, MD

## 2020-09-25 NOTE — Addendum Note (Signed)
Encounter addended by: Zola Button, RN on: 09/25/2020 3:32 PM  Actions taken: Charge Capture section accepted, Order list changed, Diagnosis association updated, MAR administration accepted, Clinical Note Signed

## 2020-11-05 ENCOUNTER — Other Ambulatory Visit: Payer: Self-pay

## 2020-11-05 ENCOUNTER — Other Ambulatory Visit (INDEPENDENT_AMBULATORY_CARE_PROVIDER_SITE_OTHER): Payer: 59

## 2020-11-05 DIAGNOSIS — E0520 Thyrotoxicosis with toxic multinodular goiter without thyrotoxic crisis or storm: Secondary | ICD-10-CM | POA: Diagnosis not present

## 2020-11-05 LAB — T3, FREE: T3, Free: 3.5 pg/mL (ref 2.3–4.2)

## 2020-11-05 LAB — TSH: TSH: 1.18 u[IU]/mL (ref 0.35–4.50)

## 2020-11-05 LAB — T4, FREE: Free T4: 0.74 ng/dL (ref 0.60–1.60)

## 2020-11-08 ENCOUNTER — Ambulatory Visit (INDEPENDENT_AMBULATORY_CARE_PROVIDER_SITE_OTHER): Payer: 59 | Admitting: Endocrinology

## 2020-11-08 ENCOUNTER — Other Ambulatory Visit: Payer: Self-pay

## 2020-11-08 ENCOUNTER — Encounter: Payer: Self-pay | Admitting: Endocrinology

## 2020-11-08 VITALS — BP 124/76 | HR 48 | Ht 61.0 in | Wt 120.2 lb

## 2020-11-08 DIAGNOSIS — E041 Nontoxic single thyroid nodule: Secondary | ICD-10-CM

## 2020-11-08 NOTE — Patient Instructions (Signed)
Stay on 3 pills daily

## 2020-11-08 NOTE — Progress Notes (Signed)
patient ID: Rhonda Marshall, female   DOB: [birth year], 70 y.o.   MRN: [id number]                                                                                                                Reason for Appointment:  Hyperthyroidism, follow-up   Chief complaint: Follow-up for overactive thyroid   History of Present Illness:   Review of her records for thyroid History indicates that she reportedly had subclinical hyperthyroidism in 2012 when she was admitted for new onset atrial fibrillation Subsequently did not have any treatment or further evaluation She has been treated for atrial fibrillation by her cardiologist and was recommended evaluation for the thyroid recently  The patient is a poor historian and on her initial visit she did not give any symptoms of palpitations, shakiness, feeling excessively warm and sweaty or weight loss.  She did complain of nervousness and fatigue.   However she has also had issues with anxiety and depression  Since her baseline TSH was very low along with upper normal free T3 she has been on PTU 50 mg twice a day since October 2017 Baseline thyrotropin receptor antibody was negative With starting treatment she had less fatigue symptoms  She was scheduled for I-131 treatment because of the finding of a large hot nodule on the left side  However she has not been able to do this since she cannot be in isolation for a couple of days at home   RECENT history:   In 03/2017 when she was off her antithyroid drugs she was not having any symptoms like palpitations, however she was having some fatigue and shortness of breath and was having more problems with her tachycardia Her free T3 was up above normal at 4.3 at the same time  She has been treated with PTU 50 mg, the dose ranging from 1 up to 2 a day  She was told to increase her PTU to twice a day instead of once a day on her visit in 09/2019 She says after increasing the dose she felt better with her  energy level and appetite  Again in 2/22 her dose was increased to 3 tablets daily when her free T3 was high Since then her weight has leveled off, previously had lost weight She is also not having fatigue and overall feels better Again does not have any palpitations, anxiety or heat intolerance   Her thyroid levels are improved with normal T3 level along with more normal TSH  Apparently cannot still do a radioactive iodine treatment since she lives in a small house with grandchildren and only 1 bathroom is available  Wt Readings from Last 3 Encounters:  11/08/20 120 lb 3.2 oz (54.5 kg)  09/25/20 125 lb (56.7 kg)  09/06/20 119 lb (54 kg)      Thyroid function tests as follows:     Lab Results  Component Value Date   FREET4 0.74 11/05/2020   FREET4 0.86 09/03/2020   FREET4 0.77 06/01/2020   T3FREE 3.5 11/05/2020  T3FREE 4.4 (H) 09/03/2020   T3FREE 3.2 06/01/2020   TSH 1.18 11/05/2020   TSH 0.35 09/03/2020   TSH 0.88 06/01/2020    Lab Results  Component Value Date   THYROTRECAB <0.50 12/24/2016     Allergies as of 11/08/2020      Reactions   Clindamycin/lincomycin Rash   Latex Rash   Latex gloves      Medication List       Accurate as of November 08, 2020  3:38 PM. If you have any questions, ask your nurse or doctor.        ALPRAZolam 1 MG tablet Commonly known as: XANAX Take 1 mg by mouth 3 (three) times daily as needed for anxiety.   diltiazem 240 MG 24 hr capsule Commonly known as: CARDIZEM CD Take 1 capsule (240 mg total) by mouth daily. OFFICE VISIT NEEDED   Eliquis 5 MG Tabs tablet Generic drug: apixaban Take 1 tablet (5 mg total) by mouth 2 (two) times daily.   ibuprofen 600 MG tablet Commonly known as: ADVIL Take 600 mg by mouth every 6 (six) hours as needed.   metoprolol 200 MG 24 hr tablet Commonly known as: Toprol XL Take 1 tablet (200 mg total) by mouth daily.   multivitamin with minerals Tabs tablet Take 1 tablet by mouth daily.    oxyCODONE-acetaminophen 10-325 MG tablet Commonly known as: PERCOCET Take 1 tablet by mouth 4 (four) times daily as needed for pain.   propylthiouracil 50 MG tablet Commonly known as: PTU Take 1 tablet (50 mg total) by mouth 3 (three) times daily.           Past Medical History:  Diagnosis Date  . Anxiety   . Atrial fibrillation ([place])    newly diagnosed 10/2010  . Family history of adverse reaction to anesthesia    Sister's BP drops   . Hypertension   . Rheumatoid arthritis(714.0)   . Subclinical hyperthyroidism     Past Surgical History:  Procedure Laterality Date  . Arm surgery     fracture, right arm  . CARDIOVERSION N/A 08/10/2014   Procedure: CARDIOVERSION;  Surgeon: Dorothy Spark, MD;  Location: [facility];  Service: Cardiovascular;  Laterality: N/A;  . CATARACT EXTRACTION    . DIRECT LARYNGOSCOPY N/A 09/02/2019   Procedure: DIRECT LARYNGOSCOPY WITH BIOPSY;  Surgeon: Rozetta Nunnery, MD;  Location: [facility];  Service: ENT;  Laterality: N/A;  . IR GASTROSTOMY TUBE MOD SED  10/17/2019  . IR GASTROSTOMY TUBE REMOVAL  01/03/2020  . TUBAL LIGATION      Family History  Problem Relation Age of Onset  . Heart attack Mother 9       pacemaker age 4  . Hypothyroidism Mother   . Cancer Father        colon and prostate  . Diabetes Brother   . CAD Brother 36  . Cancer Brother        Renal  . Hypothyroidism Sister     Social History:  reports that she quit smoking about 12 months ago. Her smoking use included cigarettes. She started smoking about 55 years ago. She has a 13.50 pack-year smoking history. She has never used smokeless tobacco. She reports previous alcohol use. She reports that she does not use drugs.  Allergies:  Allergies  Allergen Reactions  . Clindamycin/Lincomycin Rash  . Latex Rash    Latex gloves     Review of Systems  She has chronic atrial fibrillation followed by  cardiologist  She is on metoprolol 200 mg twice  daily for heart rate control followed by cardiologist   Examination:   BP 124/76   Pulse (!) 48   Ht 5\' 1"  (1.549 m)   Wt 120 lb 3.2 oz (54.5 kg)   SpO2 97%   BMI 22.71 kg/m   Thyroid is not palpable No tremor present Biceps reflexes appear normal   Assessment/Plan:   Hyperthyroidism, mild and caused by hot nodule on the left side  Previously on exam has had a multinodular goiter, this is not palpable today  Her symptoms with the hyperthyroidism at baseline had been nonspecific in the form of mild fatigue and exertional shortness of breath Also has history of atrial fibrillation Her weight has recently leveled off  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 Any transcriptional errors that result from this process are unintentional.

## 2020-12-03 ENCOUNTER — Other Ambulatory Visit: Payer: Self-pay | Admitting: Endocrinology

## 2020-12-13 ENCOUNTER — Encounter: Payer: Self-pay | Admitting: Radiation Oncology

## 2021-01-22 ENCOUNTER — Ambulatory Visit (INDEPENDENT_AMBULATORY_CARE_PROVIDER_SITE_OTHER): Payer: 59 | Admitting: Otolaryngology

## 2021-01-22 ENCOUNTER — Other Ambulatory Visit: Payer: Self-pay

## 2021-01-22 ENCOUNTER — Other Ambulatory Visit (INDEPENDENT_AMBULATORY_CARE_PROVIDER_SITE_OTHER): Payer: 59

## 2021-01-22 DIAGNOSIS — E041 Nontoxic single thyroid nodule: Secondary | ICD-10-CM

## 2021-01-22 DIAGNOSIS — Z8521 Personal history of malignant neoplasm of larynx: Secondary | ICD-10-CM

## 2021-01-22 LAB — TSH: TSH: 1.23 u[IU]/mL (ref 0.35–5.50)

## 2021-01-22 LAB — T3, FREE: T3, Free: 4.1 pg/mL (ref 2.3–4.2)

## 2021-01-22 LAB — T4, FREE: Free T4: 0.78 ng/dL (ref 0.60–1.60)

## 2021-01-22 NOTE — Progress Notes (Signed)
HPI: Rhonda Marshall is a 70 y.o. 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  This was a T1 a N0 left anterior vocal cord lesion with slight dysplasia irregularity of the right true vocal cord.  This was diagnosed in February 2021.  It was treated with radiation therapy that she completed in April 2021.  She has been doing well.  She has no significant hoarseness presently.  She has had no trouble swallowing. She is also followed by Dr. Isidore Moos and has an appointment with her she believes in September..  Past Medical History:  Diagnosis Date   Anxiety    Atrial fibrillation ([place])    newly diagnosed 10/2010   Family history of adverse reaction to anesthesia    Sister's BP drops    Hypertension    Rheumatoid arthritis(714.0)    Subclinical hyperthyroidism    Past Surgical History:  Procedure Laterality Date   Arm surgery     fracture, right arm   CARDIOVERSION N/A 08/10/2014   Procedure: CARDIOVERSION;  Surgeon: Dorothy Spark, MD;  Location: [facility];  Service: Cardiovascular;  Laterality: N/A;   CATARACT EXTRACTION     DIRECT LARYNGOSCOPY N/A 09/02/2019   Procedure: DIRECT LARYNGOSCOPY WITH BIOPSY;  Surgeon: Rozetta Nunnery, MD;  Location: [facility];  Service: ENT;  Laterality: N/A;   IR GASTROSTOMY TUBE MOD SED  10/17/2019   IR GASTROSTOMY TUBE REMOVAL  01/03/2020   TUBAL LIGATION     Social History   Socioeconomic History   Marital status: Widowed    Spouse name: Not on file   Number of children: 1   Years of education: Not on file   Highest education level: Not on file  Occupational History   Occupation: laborer    Comment: [employer]  Tobacco Use   Smoking status: Former    Packs/day: 0.25    Years: 54.00    Pack years: 13.50    Types: Cigarettes    Start date: 1967    Quit date: 10/2019    Years since quitting: 1.2   Smokeless tobacco: Never  Vaping Use   Vaping Use: Never used  Substance and Sexual Activity    Alcohol use: Not Currently    Alcohol/week: 0.0 standard drinks   Drug use: No   Sexual activity: Not on file  Other Topics Concern   Not on file  Social History Narrative   Not on file   Social Determinants of Health   Financial Resource Strain: Not on file  Food Insecurity: Not on file  Transportation Needs: Not on file  Physical Activity: Not on file  Stress: Not on file  Social Connections: Not on file   Family History  Problem Relation Age of Onset   Heart attack Mother 82       pacemaker age 33   Hypothyroidism Mother    Cancer Father        colon and prostate   Diabetes Brother    CAD Brother 62   Cancer Brother        Renal   Hypothyroidism Sister    Allergies  Allergen Reactions   Clindamycin/Lincomycin Rash   Latex Rash    Latex gloves   Prior to Admission medications   Medication Sig Start Date End Date Taking? Authorizing Provider  ALPRAZolam Duanne Moron) 1 MG tablet Take 1 mg by mouth 3 (three) times daily as needed for anxiety.  05/11/14   [provider]  diltiazem (CARDIZEM CD)  240 MG 24 hr capsule Take 1 capsule (240 mg total) by mouth daily. OFFICE VISIT NEEDED 05/07/20   Minus Breeding, MD  ELIQUIS 5 MG TABS tablet Take 1 tablet (5 mg total) by mouth 2 (two) times daily. 06/18/20   Minus Breeding, MD  ibuprofen (ADVIL) 600 MG tablet Take 600 mg by mouth every 6 (six) hours as needed.    [provider]  metoprolol (TOPROL XL) 200 MG 24 hr tablet Take 1 tablet (200 mg total) by mouth daily. 05/07/20   Minus Breeding, MD  Multiple Vitamin (MULTIVITAMIN WITH MINERALS) TABS tablet Take 1 tablet by mouth daily.     [provider]  oxyCODONE-acetaminophen (PERCOCET) 10-325 MG tablet Take 1 tablet by mouth 4 (four) times daily as needed for pain.    [provider]  propylthiouracil (PTU) 50 MG tablet Take 1 tablet (50 mg total) by mouth 3 (three) times daily. 12/04/20   Elayne Snare, MD     Positive ROS: Otherwise  negative  All other systems have been reviewed and were otherwise negative with the exception of those mentioned in the HPI and as above.  Physical Exam: Constitutional: Alert, well-appearing, no acute distress Ears: External ears without lesions or tenderness. Ear canals are clear bilaterally with intact, clear TMs.  Nasal: External nose without lesions. Septum with minimal deformity and mild rhinitis.. Clear nasal passages Oral: Lips and gums without lesions. Tongue and palate mucosa without lesions. Posterior oropharynx clear.  Tonsil regions appear benign.  Indirect laryngoscopy revealed a clear base of tongue vallecula and epiglottis.  Epiglottis was slightly edematous.  Could visualize the vocal cords posteriorly with normal vocal mobility. Fiberoptic laryngoscopy was performed through the right nostril.  The nasopharynx was clear.  The base of tongue vallecula and epiglottis were normal.  Vocal cords were clear bilaterally with normal vocal mobility.  The anterior commissure and anterior vocal cords on both sides were clear with no lesions noted. Neck: No palpable adenopathy or masses no palpable adenopathy on either side of the neck. Respiratory: Breathing comfortably  Skin: No facial/neck lesions or rash noted.  Laryngoscopy  Date/Time: 01/22/2021 2:37 PM Performed by: Rozetta Nunnery, MD Authorized by: Rozetta Nunnery, MD   Consent:    Consent obtained:  Verbal   Consent given by:  Patient Procedure details:    Indications: oncologic surveillance follow-up     Medication:  Afrin   Instrument: flexible fiberoptic laryngoscope     Scope location: right nare   Sinus:    Right nasopharynx: normal   Mouth:    Oropharynx: normal     Vallecula: normal     Base of tongue: normal     Epiglottis: normal   Throat:    True vocal cords: normal   Comments:     On fiberoptic laryngoscopy the nasopharynx, base of tongue, and vallecula were clear.  The epiglottis was  slightly edematous but clear otherwise.  Both vocal cords were clear with normal vocal mobility bilaterally with no evidence of persistent or recurrent cancer.  Assessment: History of a T1N0 left anterior vocal cord cancer status post completion of radiation therapy in April 2021. Doing well with no evidence of recurrent or persistent disease.  Plan: Patient is scheduled for follow-up with Dr. Isidore Moos in September of this year.   Radene Journey, MD

## 2021-01-25 ENCOUNTER — Other Ambulatory Visit: Payer: Self-pay | Admitting: Cardiology

## 2021-01-28 ENCOUNTER — Other Ambulatory Visit: Payer: 59

## 2021-01-31 ENCOUNTER — Encounter: Payer: Self-pay | Admitting: Endocrinology

## 2021-01-31 ENCOUNTER — Ambulatory Visit (INDEPENDENT_AMBULATORY_CARE_PROVIDER_SITE_OTHER): Payer: 59 | Admitting: Endocrinology

## 2021-01-31 ENCOUNTER — Other Ambulatory Visit: Payer: Self-pay

## 2021-01-31 VITALS — BP 124/76 | HR 92 | Ht 60.75 in | Wt 120.0 lb

## 2021-01-31 DIAGNOSIS — E0520 Thyrotoxicosis with toxic multinodular goiter without thyrotoxic crisis or storm: Secondary | ICD-10-CM | POA: Diagnosis not present

## 2021-01-31 NOTE — Progress Notes (Signed)
patient ID: Rhonda Marshall, female   DOB: Jul 06, 1951, 70 y.o.   MRN: [id number]                                                                                                                Reason for Appointment:  Hyperthyroidism, follow-up   Chief complaint: Follow-up for overactive thyroid   History of Present Illness:   Review of her records for thyroid History indicates that she reportedly had subclinical hyperthyroidism in 2012 when she was admitted for new onset atrial fibrillation Subsequently did not have any treatment or further evaluation She has been treated for atrial fibrillation by her cardiologist and was recommended evaluation for the thyroid recently  The patient is a poor historian and on her initial visit she did not give any symptoms of palpitations, shakiness, feeling excessively warm and sweaty or weight loss.  She did complain of nervousness and fatigue.   However she has also had issues with anxiety and depression  Since her baseline TSH was very low along with upper normal free T3 she has been on PTU 50 mg twice a day since October 2017 Baseline thyrotropin receptor antibody was negative With starting treatment she had less fatigue symptoms  She was scheduled for I-131 treatment because of the finding of a large hot nodule on the left side  However she has not been able to do this since she cannot be in isolation for a couple of days at home   RECENT history:   In 03/2017 when she was off her antithyroid drugs she was not having any symptoms like palpitations, however she was having some fatigue and shortness of breath and was having more problems with her tachycardia Her free T3 was up above normal at 4.3 at the same time  She has been treated with PTU 50 mg, the dose ranging from 1 up to 3 tablets a day  She was told to increase her PTU to twice a day instead of once a day on her visit in 09/2019 She says after increasing the dose she felt better with  her energy level and appetite  In 2/22 her dose was increased to 3 tablets daily when her free T3 was high With the increased dose she had less fatigue and overall felt better Recently does not have any palpitations, anxiety or heat intolerance She says her pulse was higher today with stress  Her thyroid levels are stable with upper normal T3 level along with normal TSH  Again she says she cannot still do the recommended radioactive iodine treatment since she lives in a small house with grandchildren and only 1 bathroom is available  Wt Readings from Last 3 Encounters:  01/31/21 120 lb (54.4 kg)  11/08/20 120 lb 3.2 oz (54.5 kg)  09/25/20 125 lb (56.7 kg)      Thyroid function tests as follows:     Lab Results  Component Value Date   FREET4 0.78 01/22/2021   FREET4 0.74 11/05/2020   FREET4 0.86 09/03/2020  T3FREE 4.1 01/22/2021   T3FREE 3.5 11/05/2020   T3FREE 4.4 (H) 09/03/2020   TSH 1.23 01/22/2021   TSH 1.18 11/05/2020   TSH 0.35 09/03/2020    Lab Results  Component Value Date   THYROTRECAB <0.50 12/24/2016     Allergies as of 01/31/2021       Reactions   Clindamycin/lincomycin Rash   Latex Rash   Latex gloves        Medication List        Accurate as of January 31, 2021 11:59 PM. If you have any questions, ask your nurse or doctor.          ALPRAZolam 1 MG tablet Commonly known as: XANAX Take 1 mg by mouth 3 (three) times daily as needed for anxiety.   diltiazem 240 MG 24 hr capsule Commonly known as: CARDIZEM CD Take 1 capsule (240 mg total) by mouth daily. OFFICE VISIT NEEDED   Eliquis 5 MG Tabs tablet Generic drug: apixaban Take 1 tablet (5 mg total) by mouth 2 (two) times daily.   ibuprofen 600 MG tablet Commonly known as: ADVIL Take 600 mg by mouth every 6 (six) hours as needed.   metoprolol 200 MG 24 hr tablet Commonly known as: Toprol XL Take 1 tablet (200 mg total) by mouth daily.   multivitamin with minerals Tabs tablet Take 1  tablet by mouth daily.   oxyCODONE-acetaminophen 10-325 MG tablet Commonly known as: PERCOCET Take 1 tablet by mouth 4 (four) times daily as needed for pain.   propylthiouracil 50 MG tablet Commonly known as: PTU Take 1 tablet (50 mg total) by mouth 3 (three) times daily.            Past Medical History:  Diagnosis Date   Anxiety    Atrial fibrillation ([place])    newly diagnosed 10/2010   Family history of adverse reaction to anesthesia    Sister's BP drops    Hypertension    Rheumatoid arthritis(714.0)    Subclinical hyperthyroidism     Past Surgical History:  Procedure Laterality Date   Arm surgery     fracture, right arm   CARDIOVERSION N/A 08/10/2014   Procedure: CARDIOVERSION;  Surgeon: Dorothy Spark, MD;  Location: [facility];  Service: Cardiovascular;  Laterality: N/A;   CATARACT EXTRACTION     DIRECT LARYNGOSCOPY N/A 09/02/2019   Procedure: DIRECT LARYNGOSCOPY WITH BIOPSY;  Surgeon: Rozetta Nunnery, MD;  Location: [facility];  Service: ENT;  Laterality: N/A;   IR GASTROSTOMY TUBE MOD SED  10/17/2019   IR GASTROSTOMY TUBE REMOVAL  01/03/2020   TUBAL LIGATION      Family History  Problem Relation Age of Onset   Heart attack Mother 67       pacemaker age 10   Hypothyroidism Mother    Cancer Father        colon and prostate   Diabetes Brother    CAD Brother 32   Cancer Brother        Renal   Hypothyroidism Sister     Social History:  reports that she quit smoking about 15 months ago. Her smoking use included cigarettes. She started smoking about 55 years ago. She has a 13.50 pack-year smoking history. She has never used smokeless tobacco. She reports previous alcohol use. She reports that she does not use drugs.  Allergies:  Allergies  Allergen Reactions   Clindamycin/Lincomycin Rash   Latex Rash    Latex gloves  Review of Systems  She has chronic atrial fibrillation followed by cardiologist  She is on metoprolol 200 mg  twice daily for heart rate control followed by cardiologist   Examination:   BP 124/76   Pulse 92   Ht 5' 0.75" (1.543 m)   Wt 120 lb (54.4 kg)   SpO2 93%   BMI 22.86 kg/m   Thyroid is not clearly palpable No tremor present Biceps reflexes appear normal Heart rate irregular and variable   Assessment/Plan:   Hyperthyroidism, mild and caused by hot nodule on the left side  Previously on exam has had a multinodular goiter, this is not palpable today  Her symptoms with the hyperthyroidism at baseline had been nonspecific in the form of mild fatigue and exertional shortness of breath Also has history of atrial fibrillation  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 Any transcriptional errors that result from this process are unintentional.

## 2021-03-14 ENCOUNTER — Telehealth: Payer: Self-pay | Admitting: Cardiology

## 2021-03-14 NOTE — Telephone Encounter (Signed)
Pt calling to ask about Paxlovid. I advised pt that unfortunately, this is not recommended to take with Eliquis per [employer]: "Concurrent use of APIXABAN and COMBINED P-GLYCOPROTEIN AND STRONG CYP3A4 INHIBITORS may result in increased exposure of apixaban."  In addition, her symptoms started on Monday so she is out of the optimal therapy window. She will continue supportive care. She was advised to contact her primary care if she develops SOB, cough, high fever.  I was able to schedule her follow up appt today as well.  She had no additional questions or needs.

## 2021-03-14 NOTE — Telephone Encounter (Signed)
Pt has tested positive for covid... daughter is wanting to know if we can prescribe pt medication to treat please advise

## 2021-04-30 ENCOUNTER — Other Ambulatory Visit: Payer: Self-pay | Admitting: Cardiology

## 2021-05-03 ENCOUNTER — Other Ambulatory Visit: Payer: Self-pay

## 2021-05-03 ENCOUNTER — Other Ambulatory Visit (INDEPENDENT_AMBULATORY_CARE_PROVIDER_SITE_OTHER): Payer: 59

## 2021-05-03 DIAGNOSIS — E0520 Thyrotoxicosis with toxic multinodular goiter without thyrotoxic crisis or storm: Secondary | ICD-10-CM

## 2021-05-03 LAB — T3, FREE: T3, Free: 3.8 pg/mL (ref 2.3–4.2)

## 2021-05-03 LAB — CBC
HCT: 37.9 % (ref 36.0–46.0)
Hemoglobin: 12.7 g/dL (ref 12.0–15.0)
MCHC: 33.5 g/dL (ref 30.0–36.0)
MCV: 96.5 fl (ref 78.0–100.0)
Platelets: 263 10*3/uL (ref 150.0–400.0)
RBC: 3.93 Mil/uL (ref 3.87–5.11)
RDW: 14 % (ref 11.5–15.5)
WBC: 8.8 10*3/uL (ref 4.0–10.5)

## 2021-05-03 LAB — TSH: TSH: 2.18 u[IU]/mL (ref 0.35–5.50)

## 2021-05-03 LAB — T4, FREE: Free T4: 0.83 ng/dL (ref 0.60–1.60)

## 2021-05-03 LAB — ALT: ALT: 9 U/L (ref 0–35)

## 2021-05-06 ENCOUNTER — Other Ambulatory Visit: Payer: Self-pay

## 2021-05-06 ENCOUNTER — Ambulatory Visit (INDEPENDENT_AMBULATORY_CARE_PROVIDER_SITE_OTHER): Payer: 59 | Admitting: Endocrinology

## 2021-05-06 VITALS — BP 148/92 | HR 76 | Ht 61.0 in | Wt 120.6 lb

## 2021-05-06 DIAGNOSIS — E0520 Thyrotoxicosis with toxic multinodular goiter without thyrotoxic crisis or storm: Secondary | ICD-10-CM

## 2021-05-06 NOTE — Progress Notes (Signed)
patient ID: Rhonda Marshall, female   DOB: 07-Jan-1951, 70 y.o.   MRN: [id number]                                                                                                                Reason for Appointment:  Hyperthyroidism, follow-up   Chief complaint: Follow-up for overactive thyroid   History of Present Illness:   Review of her records for thyroid History indicates that she reportedly had subclinical hyperthyroidism in 2012 when she was admitted for new onset atrial fibrillation Subsequently did not have any treatment or further evaluation She has been treated for atrial fibrillation by her cardiologist and was recommended evaluation for the thyroid recently  The patient is a poor historian and on her initial visit she did not give any symptoms of palpitations, shakiness, feeling excessively warm and sweaty or weight loss.  She did complain of nervousness and fatigue.   However she has also had issues with anxiety and depression  Since her baseline TSH was very low along with upper normal free T3 she has been on PTU 50 mg twice a day since October 2017 Baseline thyrotropin receptor antibody was negative With starting treatment she had less fatigue symptoms  She was scheduled for I-131 treatment because of the finding of a large hot nodule on the left side  However she has not been able to do this since she cannot be in isolation for a couple of days at home   RECENT history:   In 03/2017 when she was off her antithyroid drugs she was not having any symptoms like palpitations, however she was having some fatigue and shortness of breath and was having more problems with her tachycardia Her free T3 was up above normal at 4.3 at the same time  She has been treated with PTU 50 mg, the dose ranging from 1 up to 3 tablets a day  In 2/22 her dose was increased to 3 tablets daily when her free T3 was high With the increased dose she had less fatigue and overall felt  better Again she feels good and is only complaining of fatigue at times Does not have any palpitations, anxiety or heat intolerance  Weight is the same  Her thyroid levels are stable with upper normal T3 level along with normal TSH  Again she says she cannot do the recommended radioactive iodine treatment since she lives in a small house with grandchildren and only 1 bathroom is available  Wt Readings from Last 3 Encounters:  05/06/21 120 lb 9.6 oz (54.7 kg)  01/31/21 120 lb (54.4 kg)  11/08/20 120 lb 3.2 oz (54.5 kg)      Thyroid function tests as follows:     Lab Results  Component Value Date   FREET4 0.83 05/03/2021   FREET4 0.78 01/22/2021   FREET4 0.74 11/05/2020   T3FREE 3.8 05/03/2021   T3FREE 4.1 01/22/2021   T3FREE 3.5 11/05/2020   TSH 2.18 05/03/2021   TSH 1.23 01/22/2021   TSH 1.18 11/05/2020  Lab Results  Component Value Date   THYROTRECAB <0.50 12/24/2016     Allergies as of 05/06/2021       Reactions   Clindamycin/lincomycin Rash   Latex Rash   Latex gloves        Medication List        Accurate as of May 06, 2021  4:28 PM. If you have any questions, ask your nurse or doctor.          ALPRAZolam 1 MG tablet Commonly known as: XANAX Take 1 mg by mouth 3 (three) times daily as needed for anxiety.   diltiazem 240 MG 24 hr capsule Commonly known as: CARDIZEM CD Take 1 capsule (240 mg total) by mouth daily. OFFICE VISIT NEEDED   Eliquis 5 MG Tabs tablet Generic drug: apixaban Take 1 tablet (5 mg total) by mouth 2 (two) times daily.   ibuprofen 600 MG tablet Commonly known as: ADVIL Take 600 mg by mouth every 6 (six) hours as needed.   metoprolol 200 MG 24 hr tablet Commonly known as: TOPROL-XL Take 1 tablet (200 mg total) by mouth daily.   multivitamin with minerals Tabs tablet Take 1 tablet by mouth daily.   oxyCODONE-acetaminophen 10-325 MG tablet Commonly known as: PERCOCET Take 1 tablet by mouth 4 (four) times daily  as needed for pain.   propylthiouracil 50 MG tablet Commonly known as: PTU Take 1 tablet (50 mg total) by mouth 3 (three) times daily.            Past Medical History:  Diagnosis Date   Anxiety    Atrial fibrillation ([place])    newly diagnosed 10/2010   Family history of adverse reaction to anesthesia    Sister's BP drops    Hypertension    Rheumatoid arthritis(714.0)    Subclinical hyperthyroidism     Past Surgical History:  Procedure Laterality Date   Arm surgery     fracture, right arm   CARDIOVERSION N/A 08/10/2014   Procedure: CARDIOVERSION;  Surgeon: Dorothy Spark, MD;  Location: [facility];  Service: Cardiovascular;  Laterality: N/A;   CATARACT EXTRACTION     DIRECT LARYNGOSCOPY N/A 09/02/2019   Procedure: DIRECT LARYNGOSCOPY WITH BIOPSY;  Surgeon: Rozetta Nunnery, MD;  Location: [facility];  Service: ENT;  Laterality: N/A;   IR GASTROSTOMY TUBE MOD SED  10/17/2019   IR GASTROSTOMY TUBE REMOVAL  01/03/2020   TUBAL LIGATION      Family History  Problem Relation Age of Onset   Heart attack Mother 24       pacemaker age 61   Hypothyroidism Mother    Cancer Father        colon and prostate   Diabetes Brother    CAD Brother 11   Cancer Brother        Renal   Hypothyroidism Sister     Social History:  reports that she quit smoking about 18 months ago. Her smoking use included cigarettes. She started smoking about 55 years ago. She has a 13.50 pack-year smoking history. She has never used smokeless tobacco. She reports that she does not currently use alcohol. She reports that she does not use drugs.  Allergies:  Allergies  Allergen Reactions   Clindamycin/Lincomycin Rash   Latex Rash    Latex gloves     Review of Systems  She has chronic atrial fibrillation followed by cardiologist  She is on metoprolol 200 mg twice daily for heart rate control followed by cardiologist  Examination:   BP (!) 148/92   Pulse 76   Ht 5\' 1"   (1.549 m)   Wt 120 lb 9.6 oz (54.7 kg)   SpO2 95%   BMI 22.79 kg/m   Thyroid is just palpable on swallowing bilaterally  No tremor present Biceps reflexes appear normal   Assessment/Plan:   Hyperthyroidism, mild and caused by hot nodule on the left side  Previously on exam has had a multinodular goiter, this is not palpable today  Her symptoms with the hyperthyroidism at baseline had been nonspecific in the form of mild fatigue and exertional shortness of breath Also has history of atrial fibrillation  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 Any transcriptional errors that result from this process are unintentional.

## 2021-05-28 ENCOUNTER — Other Ambulatory Visit: Payer: Self-pay

## 2021-05-28 ENCOUNTER — Ambulatory Visit
Admission: RE | Admit: 2021-05-28 | Discharge: 2021-05-28 | Disposition: A | Discharge status: Home / Self Care | Payer: 59 | Source: Ambulatory Visit | Attending: Radiation Oncology | Admitting: Radiation Oncology

## 2021-05-28 VITALS — BP 144/83 | HR 99 | Temp 97.8°F | Resp 19 | Wt 119.4 lb

## 2021-05-28 DIAGNOSIS — Z8521 Personal history of malignant neoplasm of larynx: Secondary | ICD-10-CM | POA: Diagnosis present

## 2021-05-28 DIAGNOSIS — C320 Malignant neoplasm of glottis: Secondary | ICD-10-CM

## 2021-05-28 DIAGNOSIS — Z08 Encounter for follow-up examination after completed treatment for malignant neoplasm: Secondary | ICD-10-CM | POA: Diagnosis not present

## 2021-05-28 DIAGNOSIS — Z923 Personal history of irradiation: Secondary | ICD-10-CM | POA: Insufficient documentation

## 2021-05-28 MED ORDER — OXYMETAZOLINE HCL 0.05 % NA SOLN
2.0000 | Freq: Once | NASAL | Status: AC
  Administered 2021-05-28: 2 via NASAL
  Filled 2021-05-28: qty 30

## 2021-05-28 NOTE — Progress Notes (Signed)
Rhonda Marshall presents today for follow-up after completing radiation to the larynx on 10/31/2018  Pain issues, if any: Patient denies Using a feeding tube?: N/A Weight changes, if any:  Wt Readings from Last 3 Encounters:  05/28/21 119 lb 6 oz (54.1 kg)  05/06/21 120 lb 9.6 oz (54.7 kg)  01/31/21 120 lb (54.4 kg)   Swallowing issues, if any: Patient denies--reports she can eat /drink a wide variety without difficulty Smoking or chewing tobacco? Reports occasional/rare cigarette smoking; reports she's often around second hand smoke when she's with her family Using fluoride trays daily? N/A Last ENT visit was on: 01/22/2021 Saw Dr. [name]:  "--Comments:  On fiberoptic laryngoscopy the nasopharynx, base of tongue, and vallecula were clear.  The epiglottis was slightly edematous but clear otherwise.  Both vocal cords were clear with normal vocal mobility bilaterally with no evidence of persistent or recurrent cancer. --Assessment: History of a T1N0 left anterior vocal cord cancer status post completion of radiation therapy in April 2021. Doing well with no evidence of recurrent or persistent disease. --Plan: Patient is scheduled for follow-up with Dr. Isidore Moos in September of this year."  Other notable issues, if any: Denies any ear or jaw pain, but does report occasional ring to her right ear (reports she was advised she needs hearing aides). Continues to see Dr. Dwyane Dee for overactive thyroid and is managing with propylthiouracil. Reports occasional dry mouth but denies any issues with thick saliva.

## 2021-05-29 ENCOUNTER — Encounter: Payer: Self-pay | Admitting: Radiation Oncology

## 2021-05-29 NOTE — Progress Notes (Signed)
Radiation Oncology         [phone] ________________________________  Name: Rhonda Marshall MRN: [id number]  Date: 05/28/2021  DOB: [birth year]  Follow-Up Visit Note  CC: Timoteo Gaul, FNP  Rozetta Nunnery, *  Diagnosis and Prior Radiotherapy:       ICD-10-CM   1. 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 oxymetazoline (AFRIN) 0.05 % nasal spray 2 spray    Fiberoptic laryngoscopy     Cancer Staging Malignant neoplasm of glottis (HCC) Staging form: Larynx - Glottis, AJCC 8th Edition - Clinical stage from 09/09/2019: Stage I (cT1b, cN0, cM0) - Signed by Eppie Gibson, MD on 09/09/2019 Stage prefix: Initial diagnosis   Intent: Curative  Radiation Treatment Dates: 09/22/2019 through 10/31/2019  Site Technique Total Dose (Gy) Dose per Fx (Gy) Completed Fx Beam Energies  Larynx: HN_larynx 3D 63/63 2.25 28/28 6X   CHIEF COMPLAINT:  Here for follow-up and surveillance of glottic cancer  Narrative:    Ms. Revelle presents today for follow-up after completing radiation to the larynx on 10/31/2018  Pain issues, if any: Patient denies Using a feeding tube?: N/A Weight changes, if any:  Wt Readings from Last 3 Encounters:  05/28/21 119 lb 6 oz (54.1 kg)  05/06/21 120 lb 9.6 oz (54.7 kg)  01/31/21 120 lb (54.4 kg)   Swallowing issues, if any: Patient denies--reports she can eat /drink a wide variety without difficulty Smoking or chewing tobacco? Reports occasional/rare cigarette smoking; reports she's often around second hand smoke when she's with her family Using fluoride trays daily? N/A Last ENT visit was on: 01/22/2021 Saw Dr. [name]:  "--Comments:  On fiberoptic laryngoscopy the nasopharynx, base of tongue, and vallecula were clear.  The epiglottis was slightly edematous but clear otherwise.  Both vocal cords were clear with normal vocal mobility bilaterally with no evidence of persistent or recurrent cancer. 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 Doing well with no evidence of recurrent or persistent disease. --Plan: Patient is scheduled for follow-up with Dr. Isidore Moos in September of this year."  Other notable issues, if any: Denies any ear or jaw pain, but does report occasional ring to her right ear (reports she was advised she needs hearing aides). Continues to see Dr. Dwyane Dee for overactive thyroid and is managing with propylthiouracil. Reports occasional dry mouth but denies any issues with thick saliva.  She is very hard of hearing and wants to get hearing aids.  She has not been established with a new otolaryngologist since Dr. Pollie Friar retirement.  She reports cold-like symptoms that resolved about 2 weeks ago.  ALLERGIES:  is allergic to clindamycin/lincomycin and latex.  Meds: Current Outpatient Medications  Medication Sig Dispense Refill   ALPRAZolam (XANAX) 1 MG tablet Take 1 mg by mouth 3 (three) times daily as needed for anxiety.   0   cetirizine (ZYRTEC) 10 MG tablet Take 10 mg by mouth daily.     diltiazem (CARDIZEM CD) 240 MG 24 hr capsule Take 1 capsule (240 mg total) by mouth daily. OFFICE VISIT NEEDED 90 capsule 3   ELIQUIS 5 MG TABS tablet Take 1 tablet (5 mg total) by mouth 2 (two) times daily. 180 tablet 3   ibuprofen (ADVIL) 600 MG tablet Take 600 mg by mouth every 6 (six) hours as needed.     meloxicam (MOBIC) 7.5 MG tablet Take 7.5 mg by mouth daily as needed.     metoprolol (  TOPROL-XL) 200 MG 24 hr tablet Take 1 tablet (200 mg total) by mouth daily. 90 tablet 3   Multiple Vitamin (MULTIVITAMIN WITH MINERALS) TABS tablet Take 1 tablet by mouth daily.      oxyCODONE-acetaminophen (PERCOCET) 10-325 MG tablet Take 1 tablet by mouth 4 (four) times daily as needed for pain.     propylthiouracil (PTU) 50 MG tablet Take 1 tablet (50 mg total) by mouth 3 (three) times daily. 270 tablet 1   No current facility-administered medications  for this encounter.    Physical Findings: The patient is in no acute distress. Patient is alert and oriented. Wt Readings from Last 3 Encounters:  05/28/21 119 lb 6 oz (54.1 kg)  05/06/21 120 lb 9.6 oz (54.7 kg)  01/31/21 120 lb (54.4 kg)    weight is 119 lb 6 oz (54.1 kg). Her oral temperature is 97.8 F (36.6 C). Her blood pressure is 144/83 (abnormal) and her pulse is 99. Her respiration is 19 and oxygen saturation is 98%. .  General: Alert and oriented, in no acute distress HEENT: Head is normocephalic. Extraocular movements are intact.  She is very hard of hearing Neck:  well-healed skin with no palpable lymphadenopathy; + moderate anterior lymphedema Skin: Skin over neck is warm, dry, intact, mild pigment changes in RT field Psychiatric: Judgment and insight are intact. Affect is appropriate. Heart regular rate and rhythm Chest clear to auscultation bilaterally  PROCEDURE NOTE: After obtaining consent and anesthetizing the nasal cavity with topical oxymetazoline oral decongestant, the flexible endoscope was introduced and passed through the nasal cavity.  No visible lesions in the nasopharynx, pharynx, hypopharynx.  The true vocal cords are symmetrically mobile.  Erythema noted throughout the majority of the left true vocal cord and the adjacent false cord.  Photo below.     Lab Findings: Lab Results  Component Value Date   WBC 8.8 05/03/2021   HGB 12.7 05/03/2021   HCT 37.9 05/03/2021   MCV 96.5 05/03/2021   PLT 263.0 05/03/2021    Lab Results  Component Value Date   TSH 2.18 05/03/2021    Radiographic Findings: No results found.  Impression/Plan: This is a delightful woman with a history of glottic cancer, status post radiation therapy.    1) Head and Neck Cancer Status:  I conferred with Dr. Constance Holster about the erythema of her left true cord.  I shared photos from her laryngoscopy with him.  He will see her in the near future to rule out recurrent disease.  Our  navigator is facilitating the referral.  We will also get her set up with audiology given her hearing loss which is unrelated to radiation therapy  2) Nutritional Status: No active issues   3) Risk Factors: The patient has been educated about risk factors including alcohol and tobacco abuse; they understand that avoidance of alcohol and tobacco is important to prevent recurrences as well as other cancers   4) Swallowing: Good function, continue speech-language pathology exercises.  5)  Thyroid function: follows w/ endocrinology Lab Results  Component Value Date   TSH 2.18 05/03/2021    6) She has lymphedema in her neck but declines referral versus physical therapy   7) Follow-up in 12 months - sooner if needed  On date of service, in total, I spent 30 minutes on this encounter.  She was seen in person.   _____________________________________   Eppie Gibson, MD

## 2021-06-11 ENCOUNTER — Other Ambulatory Visit: Payer: Self-pay | Admitting: Cardiology

## 2021-06-11 DIAGNOSIS — I4891 Unspecified atrial fibrillation: Secondary | ICD-10-CM

## 2021-06-11 DIAGNOSIS — Z5181 Encounter for therapeutic drug level monitoring: Secondary | ICD-10-CM

## 2021-06-11 NOTE — Telephone Encounter (Signed)
Prescription refill request for Eliquis received. Indication: Afib  Last office visit: 05/07/20 - Pt has sched appt with Dr Percival Spanish on 06/14/21 Scr: 0.92 (10/20/19) Age: 70 Weight: 54.1kg   Pt labs overdue. Pt has scheduled appt with Dr Percival Spanish on 06/14/21. Labs also ordered to be drawn at this office visit. 1 month supply sent to requested pharmacy until scheduled appt.

## 2021-06-13 NOTE — Progress Notes (Signed)
Cardiology Office Note   Date:  06/14/2021   ID:  Rhonda, Marshall 05-24-1951, MRN [id number]  PCP:  Timoteo Gaul, FNP  Cardiologist:   None   Chief Complaint  Patient presents with   Atrial Fibrillation      History of Present Illness: Rhonda Marshall is a 70 y.o. female who presents for evaluation of atrial fibrillation. She had DCCV in the past but she went back into atrial fib.  We have managed her with rate control and anticoagulation.    Since I last saw her she has been doing okay.  She goes to the mall shopping.  She does grocery shopping.  She has had follow-up of her throat cancer that was radiated and she has no recurrence.  She has family members who have cancer and she had some death in her family before I saw her last time but she is doing well emotionally.  She is limited somewhat by arthritis and she is going to see rheumatologist.  However, she does get around and do the activities as above and denies any chest pressure, neck or arm discomfort.  She only feels her heart racing if she gets emotionally agitated.   Past Medical History:  Diagnosis Date   Anxiety    Atrial fibrillation ([place])    newly diagnosed 10/2010   Family history of adverse reaction to anesthesia    Sister's BP drops    Hypertension    Rheumatoid arthritis(714.0)    Subclinical hyperthyroidism     Past Surgical History:  Procedure Laterality Date   Arm surgery     fracture, right arm   CARDIOVERSION N/A 08/10/2014   Procedure: CARDIOVERSION;  Surgeon: Dorothy Spark, MD;  Location: [facility];  Service: Cardiovascular;  Laterality: N/A;   CATARACT EXTRACTION     DIRECT LARYNGOSCOPY N/A 09/02/2019   Procedure: DIRECT LARYNGOSCOPY WITH BIOPSY;  Surgeon: Rozetta Nunnery, MD;  Location: [facility];  Service: ENT;  Laterality: N/A;   IR GASTROSTOMY TUBE MOD SED  10/17/2019   IR GASTROSTOMY TUBE REMOVAL  01/03/2020   TUBAL LIGATION       Current  Outpatient Medications  Medication Sig Dispense Refill   ALPRAZolam (XANAX) 1 MG tablet Take 1 mg by mouth 3 (three) times daily as needed for anxiety.   0   apixaban (ELIQUIS) 5 MG TABS tablet Take 1 tablet (5 mg total) by mouth 2 (two) times daily. 60 tablet 0   cetirizine (ZYRTEC) 10 MG tablet Take 10 mg by mouth daily.     diltiazem (CARDIZEM CD) 240 MG 24 hr capsule Take 1 capsule (240 mg total) by mouth daily. OFFICE VISIT NEEDED 90 capsule 3   ibuprofen (ADVIL) 600 MG tablet Take 600 mg by mouth every 6 (six) hours as needed.     meloxicam (MOBIC) 7.5 MG tablet Take 7.5 mg by mouth daily as needed.     metoprolol (TOPROL-XL) 200 MG 24 hr tablet Take 1 tablet (200 mg total) by mouth daily. 90 tablet 3   Multiple Vitamin (MULTIVITAMIN WITH MINERALS) TABS tablet Take 1 tablet by mouth daily.      oxyCODONE-acetaminophen (PERCOCET) 10-325 MG tablet Take 1 tablet by mouth 4 (four) times daily as needed for pain.     propylthiouracil (PTU) 50 MG tablet Take 1 tablet (50 mg total) by mouth 3 (three) times daily. 270 tablet 1   No current facility-administered medications for this visit.    Allergies:  Clindamycin/lincomycin and Latex    ROS:  Please see the history of present illness.   Otherwise, review of systems are positive for none.   All other systems are reviewed and negative.    PHYSICAL EXAM: VS:  BP 107/68   Pulse 70   Ht 5\' 1"  (1.549 m)   Wt 124 lb 9.6 oz (56.5 kg)   SpO2 99%   BMI 23.54 kg/m  , BMI Body mass index is 23.54 kg/m. GENERAL:  Well appearing NECK:  No jugular venous distention, waveform within normal limits, carotid upstroke brisk and symmetric, right carotid bruits, no thyromegaly LUNGS:  Clear to auscultation bilaterally CHEST:  Unremarkable HEART:  PMI not displaced or sustained,S1 and S2 within normal limits, no S3, no clicks, no rubs, no murmurs, irregular ABD:  Flat, positive bowel sounds normal in frequency in pitch, no bruits, no rebound, no  guarding, no midline pulsatile mass, no hepatomegaly, no splenomegaly EXT:  2 plus pulses throughout, no edema, no cyanosis no clubbing,m   EKG:  EKG is  ordered today. The ekg ordered today demonstrates atrial fibrillation, rate 70, interventricular conduction delay, left axis deviation   Recent Labs: 05/03/2021: ALT 9; Hemoglobin 12.7; Platelets 263.0; TSH 2.18    Lipid Panel    Component Value Date/Time   LDLDIRECT 117 (H) 09/20/2008 2239      Wt Readings from Last 3 Encounters:  06/14/21 124 lb 9.6 oz (56.5 kg)  05/28/21 119 lb 6 oz (54.1 kg)  05/06/21 120 lb 9.6 oz (54.7 kg)      Other studies Reviewed: Additional studies/ records that were reviewed today include: Labs. Review of the above records demonstrates:  Please see elsewhere in the note.     ASSESSMENT AND PLAN:  ATRIAL FIB:     [name] has a CHA2DS2 - VASc score of 2.    She tolerates anticoagulation.  No change in therapy.   TOBACCO ABUSE: Patient is not smoking.  No change in therapy.  HYPERTHYROID:     This is followed by Dr. Dwyane Dee.  I reviewed labs.  CARDIOMYOPATHY:    She has had a mildly reduced ejection fraction.  I will follow this clinically.   EDEMA:   She has mild lower extremity swelling.  No change in therapy.  BRUIT: I will check carotid Dopplers   current medicines are reviewed at length with the patient today.  The patient does not have concerns regarding medicines.  The following changes have been made: None  Labs/ tests ordered today include: None  Orders Placed This Encounter  Procedures   US Carotid Duplex Bilateral   EKG 12-Lead      Disposition:   FU with one year   Signed, Minus Breeding, MD  06/14/2021 12:24 PM    [place]

## 2021-06-14 ENCOUNTER — Ambulatory Visit (INDEPENDENT_AMBULATORY_CARE_PROVIDER_SITE_OTHER): Payer: 59 | Admitting: Cardiology

## 2021-06-14 ENCOUNTER — Encounter: Payer: Self-pay | Admitting: Cardiology

## 2021-06-14 ENCOUNTER — Other Ambulatory Visit: Payer: Self-pay

## 2021-06-14 VITALS — BP 107/68 | HR 70 | Ht 61.0 in | Wt 124.6 lb

## 2021-06-14 DIAGNOSIS — I482 Chronic atrial fibrillation, unspecified: Secondary | ICD-10-CM | POA: Diagnosis not present

## 2021-06-14 DIAGNOSIS — M7989 Other specified soft tissue disorders: Secondary | ICD-10-CM

## 2021-06-14 DIAGNOSIS — I429 Cardiomyopathy, unspecified: Secondary | ICD-10-CM | POA: Diagnosis not present

## 2021-06-14 NOTE — Patient Instructions (Signed)
Medication Instructions:  Your Physician recommend you continue on your current medication as directed.    *If you need a refill on your cardiac medications before your next appointment, please call your pharmacy*    Testing/Procedures: Your physician has requested that you have a carotid duplex. This test is an ultrasound of the carotid arteries in your neck. It looks at blood flow through these arteries that supply the brain with blood. Allow one hour for this exam. There are no restrictions or special instructions.   Follow-Up: At [facility], you and your health needs are our priority.  As part of our continuing mission to provide you with exceptional heart care, we have created designated Provider Care Teams.  These Care Teams include your primary Cardiologist (physician) and Advanced Practice Providers (APPs -  Physician Assistants and Nurse Practitioners) who all work together to provide you with the care you need, when you need it.  We recommend signing up for the patient portal called "MyChart".  Sign up information is provided on this After Visit Summary.  MyChart is used to connect with patients for Virtual Visits (Telemedicine).  Patients are able to view lab/test results, encounter notes, upcoming appointments, etc.  Non-urgent messages can be sent to your provider as well.   To learn more about what you can do with MyChart, go to [email or web address].    Your next appointment:   12 month(s)  The format for your next appointment:   In Person  Provider:   Dr. Vita Barley, MD

## 2021-07-17 NOTE — Progress Notes (Signed)
Oncology Nurse Navigator Documentation   I called Ms. Rieke and left a voice mail reminding her of her appointment at [facility] on 07/23/21 for audiology and with Dr. Constance Holster starting at 8:30 am. I left my direct contact information for her to call me if she had any questions.   Harlow Asa RN, BSN, OCN Head & Neck Oncology Nurse [place] at [facility] Phone # [phone]  Fax # [phone]

## 2021-07-18 ENCOUNTER — Encounter: Payer: Self-pay | Admitting: Radiation Oncology

## 2021-07-25 ENCOUNTER — Other Ambulatory Visit: Payer: Self-pay | Admitting: *Deleted

## 2021-07-25 DIAGNOSIS — R0989 Other specified symptoms and signs involving the circulatory and respiratory systems: Secondary | ICD-10-CM

## 2021-07-25 NOTE — Progress Notes (Signed)
as

## 2021-08-09 ENCOUNTER — Other Ambulatory Visit: Payer: Self-pay

## 2021-08-09 ENCOUNTER — Ambulatory Visit (HOSPITAL_COMMUNITY)
Admission: RE | Admit: 2021-08-09 | Discharge: 2021-08-09 | Disposition: A | Discharge status: Home / Self Care | Payer: 59 | Source: Ambulatory Visit | Attending: Cardiology | Admitting: Cardiology

## 2021-08-09 DIAGNOSIS — R0989 Other specified symptoms and signs involving the circulatory and respiratory systems: Secondary | ICD-10-CM | POA: Diagnosis present

## 2021-08-12 ENCOUNTER — Other Ambulatory Visit (HOSPITAL_BASED_OUTPATIENT_CLINIC_OR_DEPARTMENT_OTHER): Payer: Self-pay | Admitting: *Deleted

## 2021-08-12 DIAGNOSIS — I6523 Occlusion and stenosis of bilateral carotid arteries: Secondary | ICD-10-CM

## 2021-08-29 ENCOUNTER — Other Ambulatory Visit: Payer: Self-pay | Admitting: Endocrinology

## 2021-09-02 ENCOUNTER — Other Ambulatory Visit: Payer: 59

## 2021-09-03 ENCOUNTER — Other Ambulatory Visit: Payer: Self-pay

## 2021-09-03 ENCOUNTER — Other Ambulatory Visit (INDEPENDENT_AMBULATORY_CARE_PROVIDER_SITE_OTHER): Payer: 59

## 2021-09-03 DIAGNOSIS — E0520 Thyrotoxicosis with toxic multinodular goiter without thyrotoxic crisis or storm: Secondary | ICD-10-CM

## 2021-09-04 LAB — T3, FREE: T3, Free: 3.3 pg/mL (ref 2.3–4.2)

## 2021-09-04 LAB — TSH: TSH: 1.14 u[IU]/mL (ref 0.35–5.50)

## 2021-09-04 LAB — T4, FREE: Free T4: 0.65 ng/dL (ref 0.60–1.60)

## 2021-09-05 ENCOUNTER — Encounter: Payer: Self-pay | Admitting: Endocrinology

## 2021-09-05 ENCOUNTER — Ambulatory Visit (INDEPENDENT_AMBULATORY_CARE_PROVIDER_SITE_OTHER): Payer: 59 | Admitting: Endocrinology

## 2021-09-05 ENCOUNTER — Other Ambulatory Visit: Payer: Self-pay

## 2021-09-05 VITALS — BP 136/82 | HR 86 | Ht 61.5 in | Wt 118.8 lb

## 2021-09-05 DIAGNOSIS — E041 Nontoxic single thyroid nodule: Secondary | ICD-10-CM

## 2021-09-05 NOTE — Progress Notes (Signed)
patient ID: Rhonda Marshall, female   DOB: 1951/06/10, 71 y.o.   MRN: [id number]                                                                                                                Reason for Appointment:  Hyperthyroidism, follow-up   Chief complaint: Follow-up for overactive thyroid   History of Present Illness:   Review of her records for thyroid History indicates that she reportedly had subclinical hyperthyroidism in 2012 when she was admitted for new onset atrial fibrillation Subsequently did not have any treatment or further evaluation She has been treated for atrial fibrillation by her cardiologist and was recommended evaluation for the thyroid recently  The patient is a poor historian and on her initial visit she did not give any symptoms of palpitations, shakiness, feeling excessively warm and sweaty or weight loss.  She did complain of nervousness and fatigue.   However she has also had issues with anxiety and depression  Since her baseline TSH was very low along with upper normal free T3 she has been on PTU 50 mg twice a day since October 2017 Baseline thyrotropin receptor antibody was negative With starting treatment she had less fatigue symptoms  She was scheduled for I-131 treatment because of the finding of a large hot nodule on the left side  However she has not been able to do this since she cannot be in isolation for a couple of days at home   RECENT history:   In 03/2017 when she was off her antithyroid drugs she was not having any symptoms like palpitations, however she was having some fatigue and shortness of breath and was having more problems with her tachycardia Her free T3 was up above normal at 4.3 at the same time  She has been treated with PTU 50 mg, the dose ranging from 1 up to 3 tablets a day  In 2/22 her dose was increased to 3 tablets daily when her free T3 was high With the increased dose she had less fatigue and overall felt  better  Recently she is feeling fairly good with no shortness of breath, weakness or fatigue No significant weight change No palpitations recently  Her thyroid levels are stable with normal free T3 level along with normal TSH  Again she says she cannot do the recommended radioactive iodine treatment since she lives in a small house with grandchildren and only 1 bathroom is available  Wt Readings from Last 3 Encounters:  09/05/21 118 lb 12.8 oz (53.9 kg)  06/14/21 124 lb 9.6 oz (56.5 kg)  05/28/21 119 lb 6 oz (54.1 kg)      Thyroid function tests as follows:     Lab Results  Component Value Date   FREET4 0.65 09/03/2021   FREET4 0.83 05/03/2021   FREET4 0.78 01/22/2021   T3FREE 3.3 09/03/2021   T3FREE 3.8 05/03/2021   T3FREE 4.1 01/22/2021   TSH 1.14 09/03/2021   TSH 2.18 05/03/2021   TSH 1.23 01/22/2021  Lab Results  Component Value Date   THYROTRECAB <0.50 12/24/2016     Allergies as of 09/05/2021       Reactions   Clindamycin/lincomycin Rash   Latex Rash   Latex gloves        Medication List        Accurate as of September 05, 2021  3:33 PM. If you have any questions, ask your nurse or doctor.          ALPRAZolam 1 MG tablet Commonly known as: XANAX Take 1 mg by mouth 3 (three) times daily as needed for anxiety.   cetirizine 10 MG tablet Commonly known as: ZYRTEC Take 10 mg by mouth daily.   diltiazem 240 MG 24 hr capsule Commonly known as: CARDIZEM CD Take 1 capsule (240 mg total) by mouth daily. OFFICE VISIT NEEDED   Eliquis 5 MG Tabs tablet Generic drug: apixaban Take 1 tablet (5 mg total) by mouth 2 (two) times daily.   ibuprofen 600 MG tablet Commonly known as: ADVIL Take 600 mg by mouth every 6 (six) hours as needed.   meloxicam 7.5 MG tablet Commonly known as: MOBIC Take 7.5 mg by mouth daily as needed.   metoprolol 200 MG 24 hr tablet Commonly known as: TOPROL-XL Take 1 tablet (200 mg total) by mouth daily.   multivitamin  with minerals Tabs tablet Take 1 tablet by mouth daily.   oxyCODONE-acetaminophen 10-325 MG tablet Commonly known as: PERCOCET Take 1 tablet by mouth 4 (four) times daily as needed for pain.   propylthiouracil 50 MG tablet Commonly known as: PTU Take 1 tablet (50 mg total) by mouth 3 (three) times daily.            Past Medical History:  Diagnosis Date   Anxiety    Atrial fibrillation ([place])    newly diagnosed 10/2010   Family history of adverse reaction to anesthesia    Sister's BP drops    Hypertension    Rheumatoid arthritis(714.0)    Subclinical hyperthyroidism     Past Surgical History:  Procedure Laterality Date   Arm surgery     fracture, right arm   CARDIOVERSION N/A 08/10/2014   Procedure: CARDIOVERSION;  Surgeon: Dorothy Spark, MD;  Location: [place];  Service: Cardiovascular;  Laterality: N/A;   CATARACT EXTRACTION     DIRECT LARYNGOSCOPY N/A 09/02/2019   Procedure: DIRECT LARYNGOSCOPY WITH BIOPSY;  Surgeon: Rozetta Nunnery, MD;  Location: [place];  Service: ENT;  Laterality: N/A;   IR GASTROSTOMY TUBE MOD SED  10/17/2019   IR GASTROSTOMY TUBE REMOVAL  01/03/2020   TUBAL LIGATION      Family History  Problem Relation Age of Onset   Heart attack Mother 63       pacemaker age 74   Hypothyroidism Mother    Cancer Father        colon and prostate   Diabetes Brother    CAD Brother 49   Cancer Brother        Renal   Hypothyroidism Sister     Social History:  reports that she quit smoking about 22 months ago. Her smoking use included cigarettes. She started smoking about 56 years ago. She has a 13.50 pack-year smoking history. She has never used smokeless tobacco. She reports that she does not currently use alcohol. She reports that she does not use drugs.  Allergies:  Allergies  Allergen Reactions   Clindamycin/Lincomycin Rash   Latex Rash  Latex gloves     Review of Systems  She has chronic atrial fibrillation  followed by cardiologist  She is on metoprolol 200 mg twice daily for heart rate control followed by cardiologist   Examination:   BP 136/82    Pulse 86    Ht 5' 1.5" (1.562 m)    Wt 118 lb 12.8 oz (53.9 kg)    SpO2 99%    BMI 22.08 kg/m   Thyroid is 1-1/2 times normal on the right side, relatively softer and about twice normal on the left, felt better laterally   No tremor present Biceps reflexes appear normal   Assessment/Plan:   Hyperthyroidism, mild and caused by hot nodule on the left side   Her symptoms with the hyperthyroidism at baseline had been nonspecific in the form of mild fatigue and exertional shortness of breath Also has history of atrial fibrillation  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 when not controlled  Hypothyroidism has been controlled with 150 mg of PTU daily Subjectively doing well also She is regular with her regimen Labs show all the thyroid parameters in the normal range  She is again not able to do a I-131 treatment because she cannot be isolated at home for 48 hours  She will continue PTU 3 tablets of the 50 mg a day and follow-up in 4 months   Iyanni Hepp 09/05/2021, 3:33 PM     Note: This office note was prepared with Dragon voice recognition system technology. Any transcriptional errors that result from this process are unintentional.

## 2021-10-14 ENCOUNTER — Other Ambulatory Visit: Payer: Self-pay | Admitting: Cardiology

## 2021-10-14 NOTE — Telephone Encounter (Signed)
Prescription refill request for Eliquis received. ?Indication:Afib ?Last office visit:12/22 ?RJJ:OACZY labs ?Age: 71 ?Weight:53.9 kg ? ?Prescription refilled ? ?

## 2021-10-23 ENCOUNTER — Other Ambulatory Visit: Payer: Self-pay | Admitting: Cardiology

## 2021-10-23 NOTE — Telephone Encounter (Signed)
Prescription refill request for Eliquis received. ?Indication:Afib ?Last office visit:12/22 ?ZXY:DSWVT Labs ?Age: 71 ?Weight:53.9 kg ? ?Prescription refilled ? ?

## 2021-10-26 IMAGING — CT CT ABDOMEN W/O CM
2 of 4 series · 15 of 46 positions shown, 17 images · non-contrast
Comparison: None.

CLINICAL DATA: Esophageal cancer, dysphagia, assess GI anatomy
prior to PEG tube placement

EXAM:
CT ABDOMEN WITHOUT CONTRAST
TECHNIQUE: Multidetector CT imaging of the abdomen was performed following the
standard protocol without IV contrast.

[Series 2: axial st · axial · 0.61mm/px · z∈[+1264,+1459]mm · 12 of 47 slices shown, 14 images]
[im 4/47  soft-tissue]
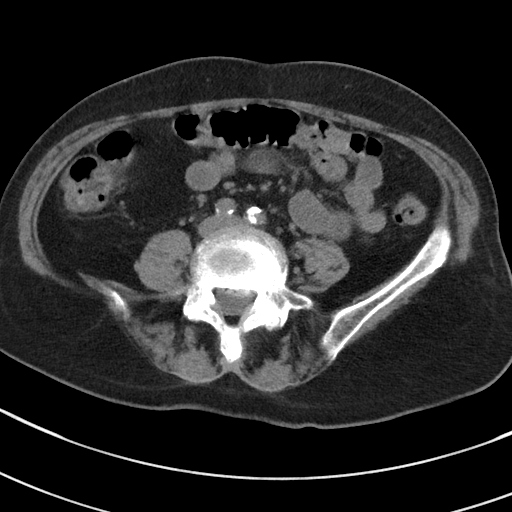
[im 4/47  bone]
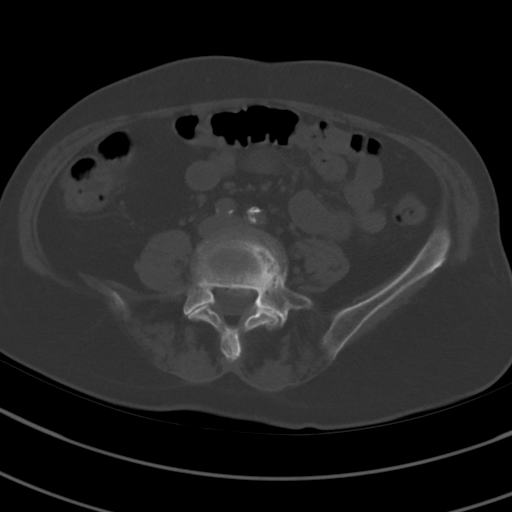
[im 8/47  soft-tissue]
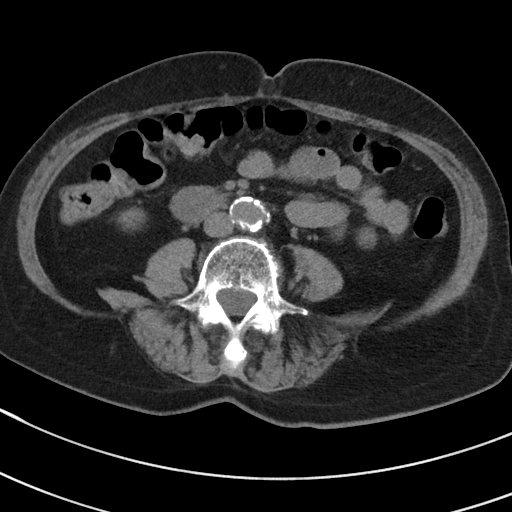
[im 11/47  soft-tissue]
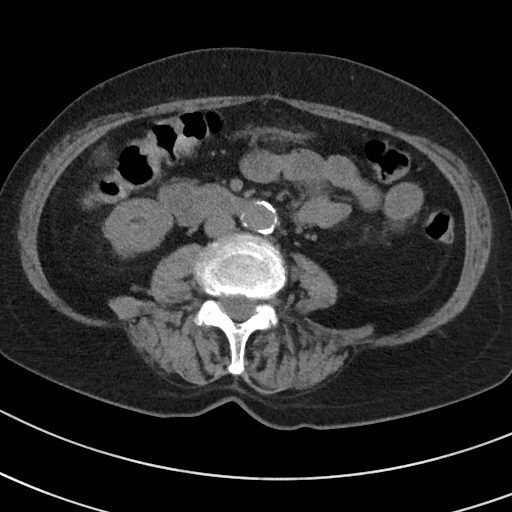
[im 15/47  soft-tissue]
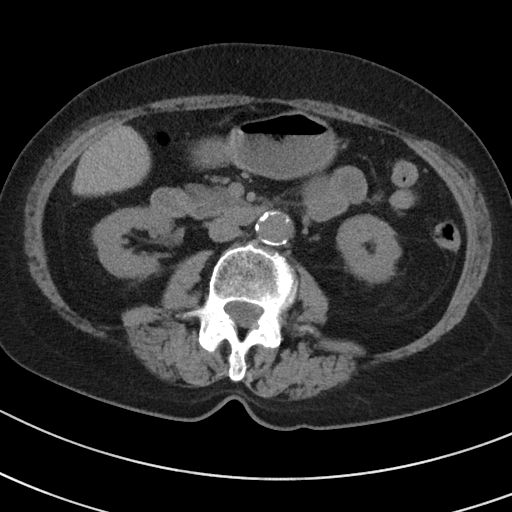
[im 18/47  soft-tissue]
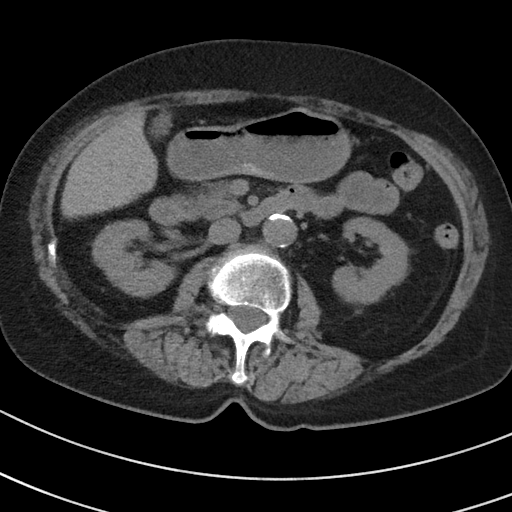
[im 22/47  soft-tissue]
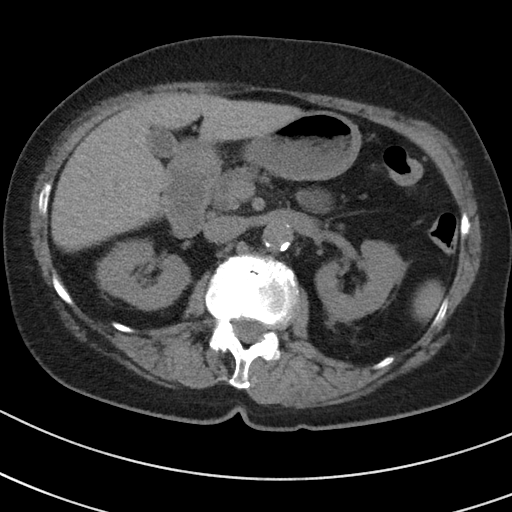
[im 25/47  soft-tissue]
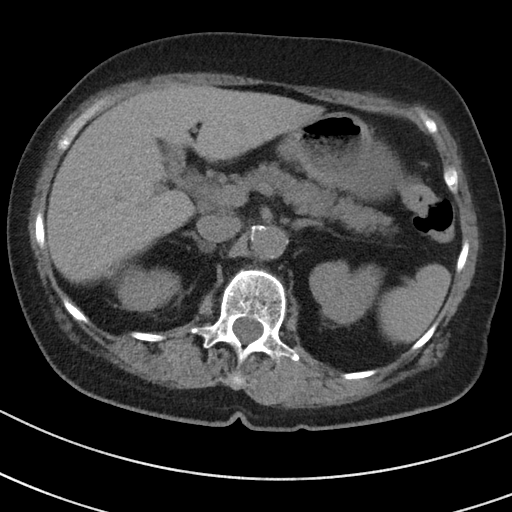
[im 29/47  soft-tissue]
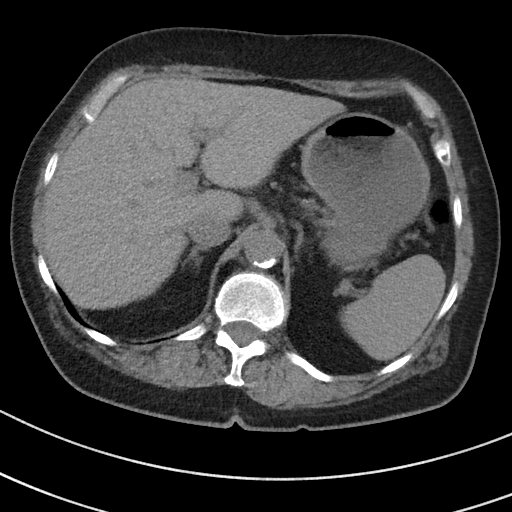
[im 32/47  soft-tissue]
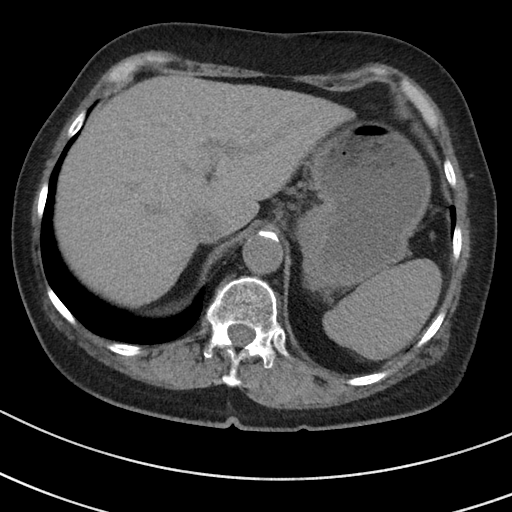
[im 32/47  bone]
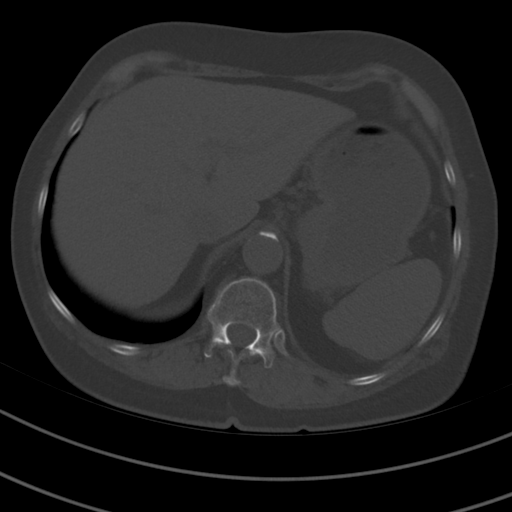
[im 36/47  soft-tissue]
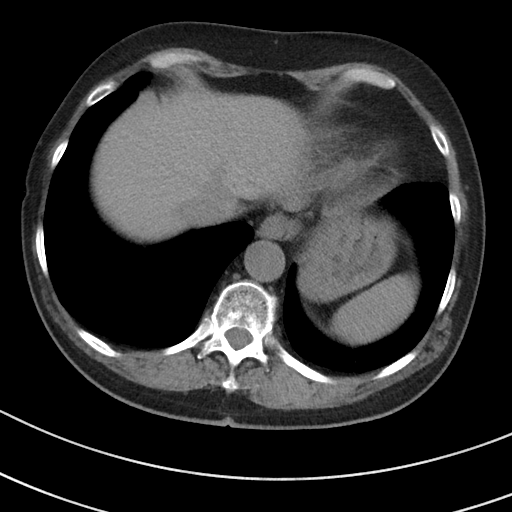
[im 39/47  soft-tissue]
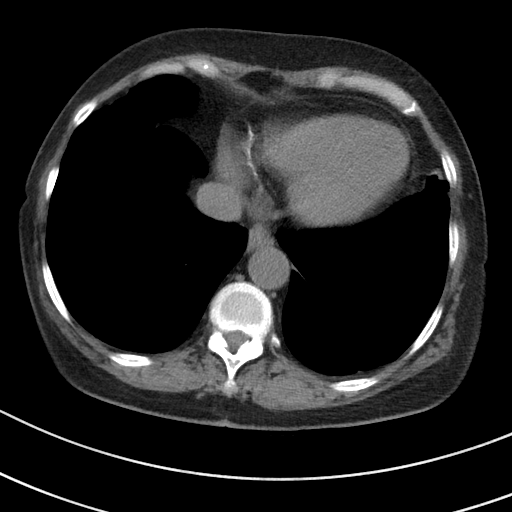
[im 43/47  soft-tissue]
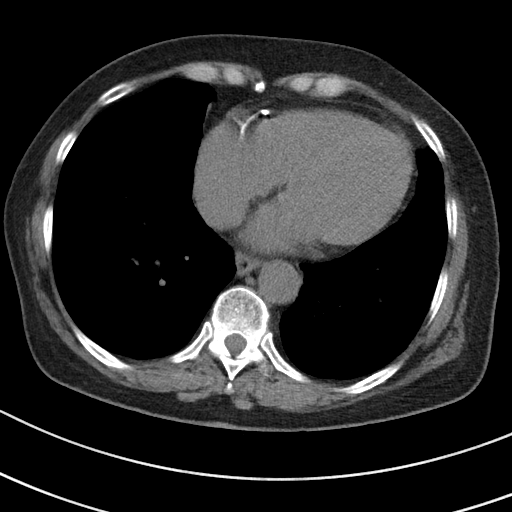

[Series 5: coronal st · coronal · 0.48mm/px · 3 of 101 slices shown]
[im 34/101  soft-tissue]
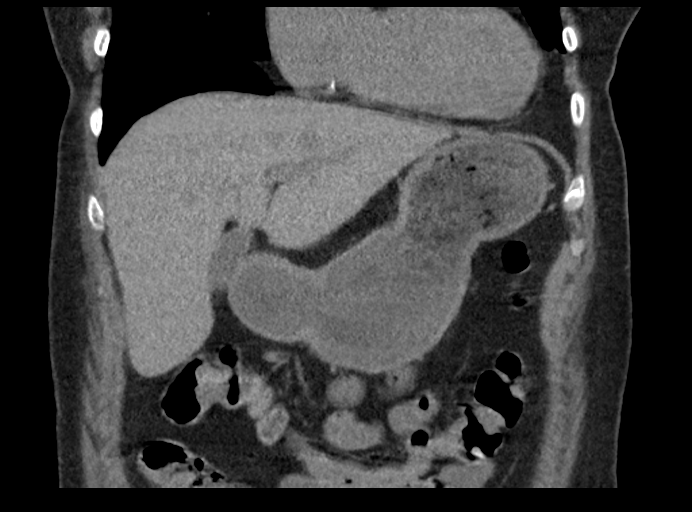
[im 45/101  soft-tissue]
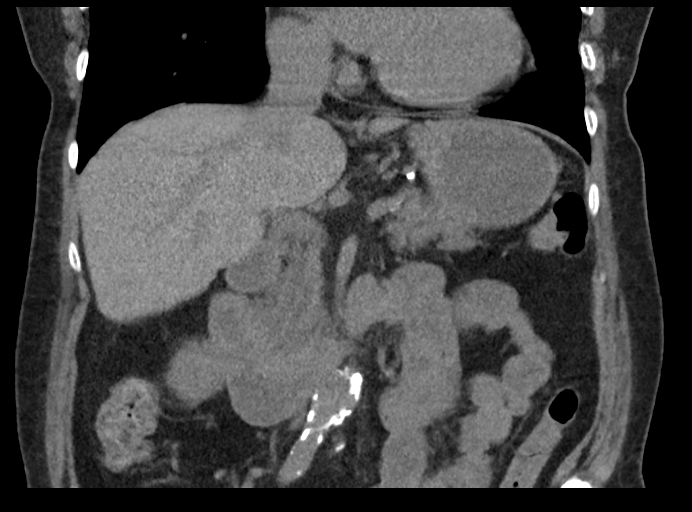
[im 56/101  soft-tissue]
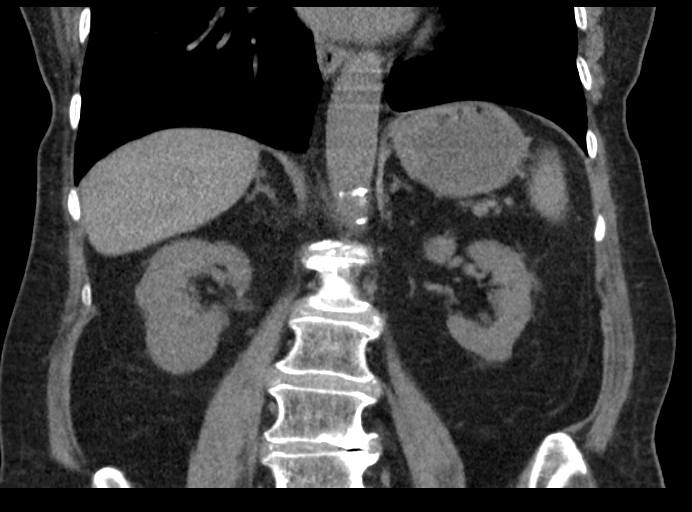

[15 of 46 positions shown; findings below may reference images not displayed]

FINDINGS: Lower chest: Coronary artery calcifications.

Hepatobiliary: No focal liver abnormality is seen. No gallstones,
gallbladder wall thickening, or biliary dilatation.

Pancreas: Unremarkable. No pancreatic ductal dilatation or
surrounding inflammatory changes.

Spleen: Normal in size without focal abnormality.

Adrenals/Urinary Tract: Adrenal glands are unremarkable. Kidneys are
normal, without renal calculi, focal lesion, or hydronephrosis.

Stomach/Bowel: Stomach is within normal limits. No evidence of bowel
wall thickening, distention, or inflammatory changes.

Vascular/Lymphatic: Aortic atherosclerosis. No enlarged abdominal
lymph nodes.

Other: No abdominal wall hernia or abnormality.

Musculoskeletal: No acute or significant osseous findings.
IMPRESSION: 1. No anatomic abnormality of the abdomen with respect to
percutaneous gastrostomy placement. The transverse colon lies in the
expected location inferior to the gastric body at this time.

2.  Coronary artery disease.  Aortic Atherosclerosis ([id number]).

## 2021-10-29 IMAGING — XA IR PERC PLACEMENT GASTROSTOMY
1 series · 1 of 1 positions shown · non-contrast
Comparison: none

INDICATION: 69-year-old female with a history of head neck carcinoma, referred
for G-tube placement

[Series 1: fl (-) angio · 1 of 1 slices shown]
[im 1/1]
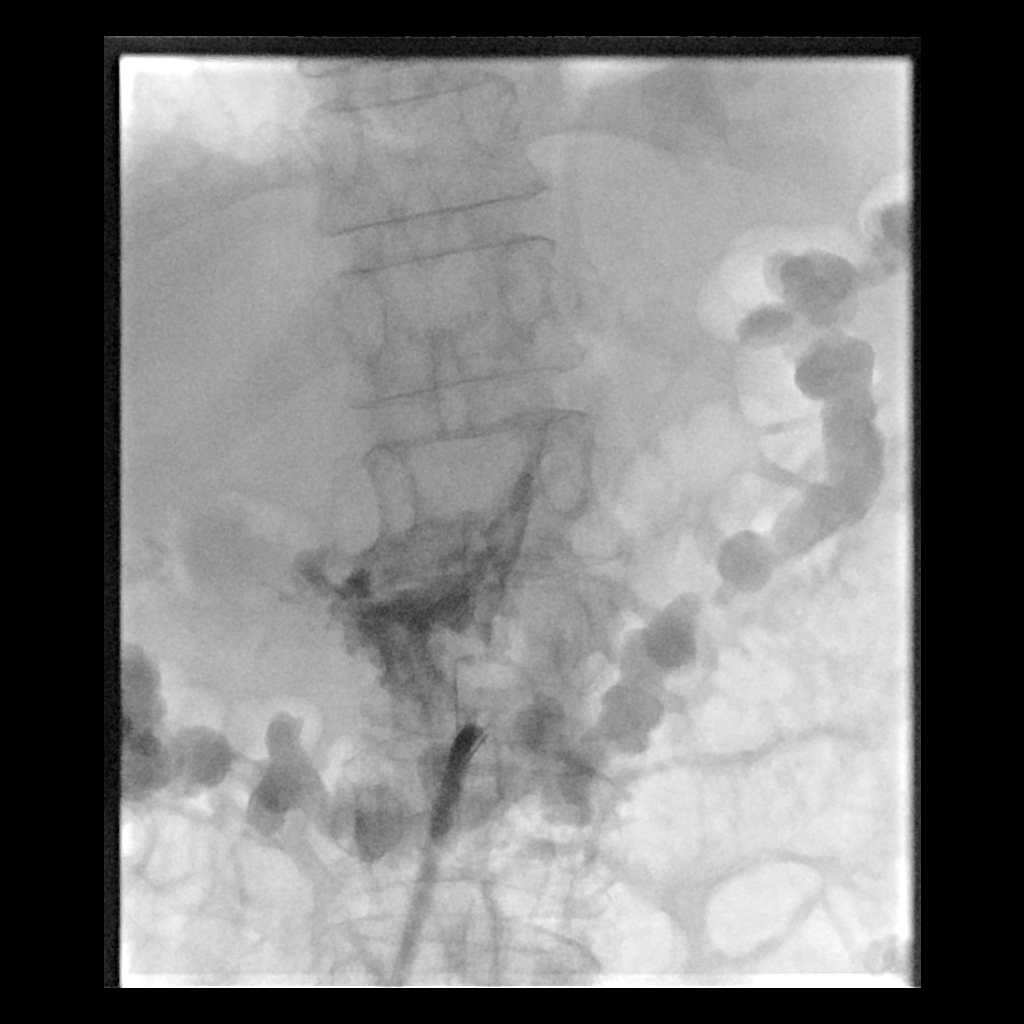

[1 of 1 positions shown; findings below may reference images not displayed]

EXAM:
PERC PLACEMENT GASTROSTOMY

MEDICATIONS:
2 g Ancef; Antibiotics were administered within 1 hour of the
procedure.

ANESTHESIA/SEDATION:
Versed 1.0 mg IV; Fentanyl 100 mcg IV

Moderate Sedation Time:  10 minutes

The patient was continuously monitored during the procedure by the
interventional radiology nurse under my direct supervision.

CONTRAST:  15mL OMNIPAQUE IOHEXOL 300 MG/ML SOLN - administered into
the gastric lumen.

FLUOROSCOPY TIME:  Fluoroscopy Time: 1 minutes 48 seconds (6 mGy).

COMPLICATIONS:
None

PROCEDURE:
Informed written consent was obtained from the patient and the
patient's family after a thorough discussion of the procedural
risks, benefits and alternatives. All questions were addressed.
Maximal Sterile Barrier Technique was utilized including caps, mask,
sterile gowns, sterile gloves, sterile drape, hand hygiene and skin
antiseptic. A timeout was performed prior to the initiation of the
procedure.

The epigastrium was prepped with Betadine in a sterile fashion, and
a sterile drape was applied covering the operative field. A sterile
gown and sterile gloves were used for the procedure.

A 5-French orogastric tube is placed under fluoroscopic guidance.
Scout imaging of the abdomen confirms barium within the transverse
colon.

The stomach was distended with gas. Under fluoroscopic guidance, an
18 gauge needle was utilized to puncture the anterior wall of the
body of the stomach. An Amplatz wire was advanced through the needle
passing a T fastener into the lumen of the stomach. The T fastener
was secured for gastropexy. A 9-French sheath was inserted.

A snare was advanced through the 9-French sheath. Elatay Zuzu Memmedova was
advanced through the orogastric tube. It was snared then pulled out
the oral cavity, pulling the snare, as well. The leading edge of the
gastrostomy was attached to the snare. It was then pulled down the
esophagus and out the percutaneous site. Tube secured in place.
Contrast was injected.

Patient tolerated the procedure well and remained hemodynamically
stable throughout.

No complications were encountered and no significant blood loss
encountered.
IMPRESSION: Status post fluoroscopic placed percutaneous gastrostomy tube, with
20 French pull-through.

## 2021-11-25 ENCOUNTER — Other Ambulatory Visit: Payer: Self-pay | Admitting: Endocrinology

## 2021-12-30 ENCOUNTER — Other Ambulatory Visit: Payer: 59

## 2022-01-02 ENCOUNTER — Ambulatory Visit: Payer: 59 | Admitting: Endocrinology

## 2022-01-03 ENCOUNTER — Other Ambulatory Visit (INDEPENDENT_AMBULATORY_CARE_PROVIDER_SITE_OTHER): Payer: 59

## 2022-01-03 DIAGNOSIS — E041 Nontoxic single thyroid nodule: Secondary | ICD-10-CM | POA: Diagnosis not present

## 2022-01-04 LAB — CBC
Hematocrit: 37.5 % (ref 34.0–46.6)
Hemoglobin: 12.8 g/dL (ref 11.1–15.9)
MCH: 31.8 pg (ref 26.6–33.0)
MCHC: 34.1 g/dL (ref 31.5–35.7)
MCV: 93 fL (ref 79–97)
Platelets: 335 10*3/uL (ref 150–450)
RBC: 4.02 x10E6/uL (ref 3.77–5.28)
RDW: 12.8 % (ref 11.7–15.4)
WBC: 14.8 10*3/uL — ABNORMAL HIGH (ref 3.4–10.8)

## 2022-01-04 LAB — TSH: TSH: 0.477 u[IU]/mL (ref 0.450–4.500)

## 2022-01-04 LAB — T3, FREE: T3, Free: 2.1 pg/mL (ref 2.0–4.4)

## 2022-01-04 LAB — T4, FREE: Free T4: 0.93 ng/dL (ref 0.82–1.77)

## 2022-01-07 ENCOUNTER — Ambulatory Visit: Payer: 59 | Admitting: Endocrinology

## 2022-01-18 ENCOUNTER — Emergency Department (HOSPITAL_BASED_OUTPATIENT_CLINIC_OR_DEPARTMENT_OTHER): Payer: 59 | Admitting: Radiology

## 2022-01-18 ENCOUNTER — Other Ambulatory Visit: Payer: Self-pay | Admitting: Internal Medicine

## 2022-01-18 ENCOUNTER — Encounter (HOSPITAL_BASED_OUTPATIENT_CLINIC_OR_DEPARTMENT_OTHER): Payer: Self-pay

## 2022-01-18 ENCOUNTER — Other Ambulatory Visit: Payer: Self-pay

## 2022-01-18 ENCOUNTER — Inpatient Hospital Stay (HOSPITAL_BASED_OUTPATIENT_CLINIC_OR_DEPARTMENT_OTHER)
Admission: EM | Admit: 2022-01-18 | Discharge: 2022-01-27 | DRG: 522 | Disposition: A | Discharge status: Skilled Nursing Facility | Payer: 59 | Attending: Internal Medicine | Admitting: Internal Medicine

## 2022-01-18 DIAGNOSIS — H9190 Unspecified hearing loss, unspecified ear: Secondary | ICD-10-CM | POA: Diagnosis present

## 2022-01-18 DIAGNOSIS — S72001A Fracture of unspecified part of neck of right femur, initial encounter for closed fracture: Principal | ICD-10-CM

## 2022-01-18 DIAGNOSIS — M1711 Unilateral primary osteoarthritis, right knee: Secondary | ICD-10-CM | POA: Diagnosis present

## 2022-01-18 DIAGNOSIS — E860 Dehydration: Secondary | ICD-10-CM | POA: Diagnosis present

## 2022-01-18 DIAGNOSIS — I110 Hypertensive heart disease with heart failure: Secondary | ICD-10-CM | POA: Diagnosis present

## 2022-01-18 DIAGNOSIS — I447 Left bundle-branch block, unspecified: Secondary | ICD-10-CM | POA: Diagnosis present

## 2022-01-18 DIAGNOSIS — D62 Acute posthemorrhagic anemia: Secondary | ICD-10-CM | POA: Diagnosis not present

## 2022-01-18 DIAGNOSIS — Z791 Long term (current) use of non-steroidal anti-inflammatories (NSAID): Secondary | ICD-10-CM

## 2022-01-18 DIAGNOSIS — Z91040 Latex allergy status: Secondary | ICD-10-CM | POA: Diagnosis not present

## 2022-01-18 DIAGNOSIS — C320 Malignant neoplasm of glottis: Secondary | ICD-10-CM | POA: Diagnosis present

## 2022-01-18 DIAGNOSIS — M069 Rheumatoid arthritis, unspecified: Secondary | ICD-10-CM | POA: Diagnosis present

## 2022-01-18 DIAGNOSIS — Z20822 Contact with and (suspected) exposure to covid-19: Secondary | ICD-10-CM | POA: Diagnosis present

## 2022-01-18 DIAGNOSIS — Z79899 Other long term (current) drug therapy: Secondary | ICD-10-CM

## 2022-01-18 DIAGNOSIS — I4891 Unspecified atrial fibrillation: Secondary | ICD-10-CM | POA: Diagnosis not present

## 2022-01-18 DIAGNOSIS — R778 Other specified abnormalities of plasma proteins: Secondary | ICD-10-CM | POA: Diagnosis not present

## 2022-01-18 DIAGNOSIS — Z85819 Personal history of malignant neoplasm of unspecified site of lip, oral cavity, and pharynx: Secondary | ICD-10-CM

## 2022-01-18 DIAGNOSIS — I428 Other cardiomyopathies: Secondary | ICD-10-CM | POA: Diagnosis present

## 2022-01-18 DIAGNOSIS — I4811 Longstanding persistent atrial fibrillation: Secondary | ICD-10-CM | POA: Diagnosis not present

## 2022-01-18 DIAGNOSIS — Z833 Family history of diabetes mellitus: Secondary | ICD-10-CM

## 2022-01-18 DIAGNOSIS — Z79890 Hormone replacement therapy (postmenopausal): Secondary | ICD-10-CM

## 2022-01-18 DIAGNOSIS — Z923 Personal history of irradiation: Secondary | ICD-10-CM

## 2022-01-18 DIAGNOSIS — I509 Heart failure, unspecified: Secondary | ICD-10-CM | POA: Diagnosis not present

## 2022-01-18 DIAGNOSIS — S72011A Unspecified intracapsular fracture of right femur, initial encounter for closed fracture: Secondary | ICD-10-CM | POA: Diagnosis present

## 2022-01-18 DIAGNOSIS — Z8249 Family history of ischemic heart disease and other diseases of the circulatory system: Secondary | ICD-10-CM | POA: Diagnosis not present

## 2022-01-18 DIAGNOSIS — I5189 Other ill-defined heart diseases: Secondary | ICD-10-CM | POA: Diagnosis not present

## 2022-01-18 DIAGNOSIS — R7989 Other specified abnormal findings of blood chemistry: Secondary | ICD-10-CM | POA: Diagnosis present

## 2022-01-18 DIAGNOSIS — Z881 Allergy status to other antibiotic agents status: Secondary | ICD-10-CM | POA: Diagnosis not present

## 2022-01-18 DIAGNOSIS — Z7984 Long term (current) use of oral hypoglycemic drugs: Secondary | ICD-10-CM

## 2022-01-18 DIAGNOSIS — G8929 Other chronic pain: Secondary | ICD-10-CM | POA: Diagnosis present

## 2022-01-18 DIAGNOSIS — Z87891 Personal history of nicotine dependence: Secondary | ICD-10-CM

## 2022-01-18 DIAGNOSIS — E876 Hypokalemia: Secondary | ICD-10-CM | POA: Diagnosis present

## 2022-01-18 DIAGNOSIS — I517 Cardiomegaly: Secondary | ICD-10-CM | POA: Diagnosis not present

## 2022-01-18 DIAGNOSIS — S72009A Fracture of unspecified part of neck of unspecified femur, initial encounter for closed fracture: Secondary | ICD-10-CM

## 2022-01-18 DIAGNOSIS — E0590 Thyrotoxicosis, unspecified without thyrotoxic crisis or storm: Secondary | ICD-10-CM | POA: Diagnosis present

## 2022-01-18 DIAGNOSIS — Z79891 Long term (current) use of opiate analgesic: Secondary | ICD-10-CM

## 2022-01-18 DIAGNOSIS — I5022 Chronic systolic (congestive) heart failure: Secondary | ICD-10-CM | POA: Insufficient documentation

## 2022-01-18 DIAGNOSIS — Z809 Family history of malignant neoplasm, unspecified: Secondary | ICD-10-CM

## 2022-01-18 DIAGNOSIS — Y92017 Garden or yard in single-family (private) house as the place of occurrence of the external cause: Secondary | ICD-10-CM | POA: Diagnosis not present

## 2022-01-18 DIAGNOSIS — Z7901 Long term (current) use of anticoagulants: Secondary | ICD-10-CM

## 2022-01-18 DIAGNOSIS — I5023 Acute on chronic systolic (congestive) heart failure: Secondary | ICD-10-CM | POA: Diagnosis not present

## 2022-01-18 DIAGNOSIS — I480 Paroxysmal atrial fibrillation: Secondary | ICD-10-CM

## 2022-01-18 DIAGNOSIS — R000 Tachycardia, unspecified: Secondary | ICD-10-CM | POA: Diagnosis not present

## 2022-01-18 DIAGNOSIS — F419 Anxiety disorder, unspecified: Secondary | ICD-10-CM | POA: Diagnosis present

## 2022-01-18 DIAGNOSIS — I4821 Permanent atrial fibrillation: Secondary | ICD-10-CM | POA: Diagnosis present

## 2022-01-18 DIAGNOSIS — I340 Nonrheumatic mitral (valve) insufficiency: Secondary | ICD-10-CM | POA: Diagnosis not present

## 2022-01-18 DIAGNOSIS — I959 Hypotension, unspecified: Secondary | ICD-10-CM | POA: Diagnosis not present

## 2022-01-18 DIAGNOSIS — M05711 Rheumatoid arthritis with rheumatoid factor of right shoulder without organ or systems involvement: Secondary | ICD-10-CM | POA: Diagnosis not present

## 2022-01-18 DIAGNOSIS — Z01810 Encounter for preprocedural cardiovascular examination: Secondary | ICD-10-CM | POA: Diagnosis not present

## 2022-01-18 DIAGNOSIS — W010XXA Fall on same level from slipping, tripping and stumbling without subsequent striking against object, initial encounter: Secondary | ICD-10-CM | POA: Diagnosis present

## 2022-01-18 DIAGNOSIS — I10 Essential (primary) hypertension: Secondary | ICD-10-CM | POA: Diagnosis not present

## 2022-01-18 HISTORY — DX: Fracture of unspecified part of neck of unspecified femur, initial encounter for closed fracture: S72.009A

## 2022-01-18 LAB — BASIC METABOLIC PANEL
Anion gap: 14 (ref 5–15)
BUN: 10 mg/dL (ref 8–23)
CO2: 24 mmol/L (ref 22–32)
Calcium: 10.2 mg/dL (ref 8.9–10.3)
Chloride: 95 mmol/L — ABNORMAL LOW (ref 98–111)
Creatinine, Ser: 0.85 mg/dL (ref 0.44–1.00)
GFR, Estimated: >60 mL/min (ref >60)
Glucose, Bld: 98 mg/dL (ref 70–99)
Potassium: 3.8 mmol/L (ref 3.5–5.1)
Sodium: 133 mmol/L — ABNORMAL LOW (ref 135–145)

## 2022-01-18 LAB — COMPREHENSIVE METABOLIC PANEL
ALT: 21 U/L (ref 0–44)
AST: 25 U/L (ref 15–41)
Albumin: 3.3 g/dL — ABNORMAL LOW (ref 3.5–5.0)
Alkaline Phosphatase: 101 U/L (ref 38–126)
Anion gap: 12 (ref 5–15)
BUN: 13 mg/dL (ref 8–23)
CO2: 24 mmol/L (ref 22–32)
Calcium: 8.8 mg/dL — ABNORMAL LOW (ref 8.9–10.3)
Chloride: 99 mmol/L (ref 98–111)
Creatinine, Ser: 0.88 mg/dL (ref 0.44–1.00)
GFR, Estimated: >60 mL/min (ref >60)
Glucose, Bld: 127 mg/dL — ABNORMAL HIGH (ref 70–99)
Potassium: 3.3 mmol/L — ABNORMAL LOW (ref 3.5–5.1)
Sodium: 135 mmol/L (ref 135–145)
Total Bilirubin: 1 mg/dL (ref 0.3–1.2)
Total Protein: 7.2 g/dL (ref 6.5–8.1)

## 2022-01-18 LAB — CBC WITH DIFFERENTIAL/PLATELET
Abs Immature Granulocytes: 0.04 10*3/uL (ref 0.00–0.07)
Basophils Absolute: 0 10*3/uL (ref 0.0–0.1)
Basophils Relative: 0 %
Eosinophils Absolute: 0 10*3/uL (ref 0.0–0.5)
Eosinophils Relative: 0 %
HCT: 36.5 % (ref 36.0–46.0)
Hemoglobin: 12.4 g/dL (ref 12.0–15.0)
Immature Granulocytes: 0 %
Lymphocytes Relative: 9 %
Lymphs Abs: 0.8 10*3/uL (ref 0.7–4.0)
MCH: 31.6 pg (ref 26.0–34.0)
MCHC: 34 g/dL (ref 30.0–36.0)
MCV: 93.1 fL (ref 80.0–100.0)
Monocytes Absolute: 0.7 10*3/uL (ref 0.1–1.0)
Monocytes Relative: 8 %
Neutro Abs: 7.5 10*3/uL (ref 1.7–7.7)
Neutrophils Relative %: 83 %
Platelets: 208 10*3/uL (ref 150–400)
RBC: 3.92 MIL/uL (ref 3.87–5.11)
RDW: 13.6 % (ref 11.5–15.5)
WBC: 9.2 10*3/uL (ref 4.0–10.5)
nRBC: 0 % (ref 0.0–0.2)

## 2022-01-18 LAB — CK: Total CK: 81 U/L (ref 38–234)

## 2022-01-18 LAB — TROPONIN I (HIGH SENSITIVITY): Troponin I (High Sensitivity): 22 ng/L — ABNORMAL HIGH (ref <18)

## 2022-01-18 LAB — PHOSPHORUS: Phosphorus: 2.5 mg/dL (ref 2.5–4.6)

## 2022-01-18 LAB — MRSA NEXT GEN BY PCR, NASAL: MRSA by PCR Next Gen: NOT DETECTED

## 2022-01-18 LAB — MAGNESIUM: Magnesium: 2.3 mg/dL (ref 1.7–2.4)

## 2022-01-18 MED ORDER — METHOCARBAMOL 1000 MG/10ML IJ SOLN
500.0000 mg | Freq: Four times a day (QID) | INTRAVENOUS | Status: DC | PRN
  Administered 2022-01-19: 500 mg via INTRAVENOUS
  Filled 2022-01-18: qty 500

## 2022-01-18 MED ORDER — CHLORHEXIDINE GLUCONATE CLOTH 2 % EX PADS
6.0000 | MEDICATED_PAD | Freq: Every day | CUTANEOUS | Status: DC
  Administered 2022-01-18 – 2022-01-22 (×5): 6 via TOPICAL

## 2022-01-18 MED ORDER — HYDROCODONE-ACETAMINOPHEN 5-325 MG PO TABS: 1.0000 | ORAL_TABLET | Freq: Four times a day (QID) | ORAL | Status: DC | PRN

## 2022-01-18 MED ORDER — CYCLOBENZAPRINE HCL 5 MG PO TABS
5.0000 mg | ORAL_TABLET | Freq: Once | ORAL | Status: AC
  Administered 2022-01-18: 5 mg via ORAL
  Filled 2022-01-18: qty 1

## 2022-01-18 MED ORDER — FENTANYL CITRATE PF 50 MCG/ML IJ SOSY
50.0000 ug | PREFILLED_SYRINGE | Freq: Once | INTRAMUSCULAR | Status: AC
  Administered 2022-01-18: 50 ug via INTRAVENOUS
  Filled 2022-01-18: qty 1

## 2022-01-18 MED ORDER — SODIUM CHLORIDE 0.9 % IV BOLUS: 500.0000 mL | Freq: Once | INTRAVENOUS | Status: DC

## 2022-01-18 MED ORDER — PROPYLTHIOURACIL 50 MG PO TABS
50.0000 mg | ORAL_TABLET | Freq: Three times a day (TID) | ORAL | Status: DC
  Administered 2022-01-19 – 2022-01-27 (×24): 50 mg via ORAL
  Filled 2022-01-18 (×28): qty 1

## 2022-01-18 MED ORDER — DILTIAZEM LOAD VIA INFUSION
10.0000 mg | Freq: Once | INTRAVENOUS | Status: AC
  Administered 2022-01-18: 10 mg via INTRAVENOUS
  Filled 2022-01-18: qty 10

## 2022-01-18 MED ORDER — HYDROMORPHONE HCL 1 MG/ML IJ SOLN
1.0000 mg | Freq: Once | INTRAMUSCULAR | Status: AC
  Administered 2022-01-18: 1 mg via INTRAVENOUS
  Filled 2022-01-18: qty 1

## 2022-01-18 MED ORDER — FENTANYL CITRATE PF 50 MCG/ML IJ SOSY
25.0000 ug | PREFILLED_SYRINGE | INTRAMUSCULAR | Status: DC | PRN
  Administered 2022-01-19 (×3): 25 ug via INTRAVENOUS
  Filled 2022-01-18 (×3): qty 1

## 2022-01-18 MED ORDER — METOPROLOL TARTRATE 25 MG PO TABS
25.0000 mg | ORAL_TABLET | Freq: Four times a day (QID) | ORAL | Status: DC
  Administered 2022-01-19: 25 mg via ORAL
  Filled 2022-01-18: qty 1

## 2022-01-18 MED ORDER — METHOCARBAMOL 500 MG PO TABS
500.0000 mg | ORAL_TABLET | Freq: Four times a day (QID) | ORAL | Status: DC | PRN
  Administered 2022-01-21: 500 mg via ORAL
  Filled 2022-01-18: qty 1

## 2022-01-18 MED ORDER — OXYCODONE HCL 5 MG PO TABS
5.0000 mg | ORAL_TABLET | Freq: Once | ORAL | Status: AC
  Administered 2022-01-18: 5 mg via ORAL
  Filled 2022-01-18: qty 1

## 2022-01-18 MED ORDER — DILTIAZEM HCL-DEXTROSE 125-5 MG/125ML-% IV SOLN (PREMIX)
5.0000 mg/h | INTRAVENOUS | Status: DC
  Administered 2022-01-18: 5 mg/h via INTRAVENOUS
  Filled 2022-01-18: qty 125

## 2022-01-18 NOTE — Assessment & Plan Note (Signed)
Will rehydrate follow fluid status ?

## 2022-01-18 NOTE — H&P (Signed)
Rhonda Marshall YIF:[id number] DOB: 09-20-1950 DOA: 01/18/2022    PCP: Timoteo Gaul, FNP   Outpatient Specialists:   CARDS:  Dr. Percival Spanish   Endocrinology Dr. Dwyane Dee  Patient arrived to ER on 01/18/22 at 1158 Referred by Attending Toy Baker, MD   Patient coming from:    home Lives  With family     Chief Complaint:   Chief Complaint  Patient presents with   Fall    HPI: Rhonda Marshall is a 71 y.o. female with medical history significant of A-fib on Eliquis Hypothyroidism bilateral carotid bruit, rheumatoid arthritis, LBBB, systolic CHF EF 86% Hx of throat cancer  Presented with   right hip pain 4 days ago she was in back yard and tripped on cinder block   Presented with Right hip pain after a mechanical fall 4 days ago No LOC no head injury Patient has history of A-fib and is on anticoagulation with Eliquis she is able to bear weight but it still painful. Rate controlled usually with Cardizem and Toprol Patient also has history of hyperthyroidism for which she takes PTU  No ETOH No tobacco  At baseline she has SOB and dyspnea with ambulation Her heart doctor told her not to walk much She does not get CP  Last dose of Eliquis was around 2pm on 01/17/22 She also took her cardizem last night not today  Have not had her thyroid pill either   Regarding pertinent Chronic problems:     Hx of throat cancer 3 year ago    HTN on Toprol and diltiazem   chronic CHF  systolic  - last echo 7672 EF showed 45-50% with mild global hypokinesis    Hyperthyroidism on PTU   A. Fib -  CHA2DS2 vas score 3      She had DCCV in the past but she went back into atrial fib  current  on anticoagulation with   Eliquis,   eding         -  Rate control:  Currently controlled with  Toprolol, and diltiazem     While in ER: Found to have confirmed right hip fracture discussed with orthopedics plan for surgery in a.m. patient is on Eliquis did not take her dose of Cardizem  today now in A-fib of RVR started on Cardizem drip heart rate was as high as 120s   Right hip femur Acute impacted subcapital right femoral neck fracture.   Lumbar spine nonacute right knee nonacute    Following Medications were ordered in ER: Medications  diltiazem (CARDIZEM) 1 mg/mL load via infusion 10 mg (10 mg Intravenous Bolus from Bag 01/18/22 1509)    And  diltiazem (CARDIZEM) 125 mg in dextrose 5% 125 mL (1 mg/mL) infusion (5 mg/hr Intravenous New Bag/Given 01/18/22 1508)  Chlorhexidine Gluconate Cloth 2 % PADS 6 each (has no administration in time range)  oxyCODONE (Oxy IR/ROXICODONE) immediate release tablet 5 mg (5 mg Oral Given 01/18/22 1316)  cyclobenzaprine (FLEXERIL) tablet 5 mg (5 mg Oral Given 01/18/22 1316)  HYDROmorphone (DILAUDID) injection 1 mg (1 mg Intravenous Given 01/18/22 1427)  fentaNYL (SUBLIMAZE) injection 50 mcg (50 mcg Intravenous Given 01/18/22 1951)    _______________________________________________________ ER Provider Called: Orthopedics   Dr. Mardelle Matte They Recommend admit to medicine n.p.o. postmidnight plan to OR in a.m. Will see in AM      ED Triage Vitals  Enc Vitals Group     BP 01/18/22 1220 (!) 112/91     Pulse Rate 01/18/22  1220 75     Resp 01/18/22 1220 14     Temp 01/18/22 1220 97.9 F (36.6 C)     Temp Source 01/18/22 1220 Oral     SpO2 01/18/22 1220 100 %     Weight 01/18/22 2040 110 lb 14.3 oz (50.3 kg)     Height 01/18/22 2040 '5\' 1"'$  (1.549 m)     Head Circumference --      Peak Flow --      Pain Score 01/18/22 1255 8     Pain Loc --      Pain Edu? --      Excl. in [place]? --   TMAX(24)@     _________________________________________ Significant initial  Findings: Abnormal Labs Reviewed  BASIC METABOLIC PANEL - Abnormal; Notable for the following components:      Result Value   Sodium 133 (*)    Chloride 95 (*)    All other components within normal limits    _________________________ Troponin 22  ECG: Ordered Personally reviewed and  interpreted by me showing: HR : 135 Rhythm: LBBB QTC 468   The recent clinical data is shown below. Vitals:   01/18/22 1845 01/18/22 1915 01/18/22 1954 01/18/22 2040  BP: 105/85 112/82 116/89 109/86  Pulse: 80 (!) 109    Resp: '18 19 18 '$ (!) 25  Temp:    98.4 F (36.9 C)  TempSrc:    Oral  SpO2: 97% 97% 98% 98%  Weight:    50.3 kg  Height:    '5\' 1"'$  (1.549 m)     WBC     Component Value Date/Time   WBC 9.2 01/18/2022 1421   LYMPHSABS 0.8 01/18/2022 1421   MONOABS 0.7 01/18/2022 1421   EOSABS 0.0 01/18/2022 1421   BASOSABS 0.0 01/18/2022 1421     UA  not ordered   Urine analysis:    Component Value Date/Time   COLORURINE YELLOW 11/05/2010 1220   APPEARANCEUR CLEAR 11/05/2010 1220   LABSPEC 1.015 11/05/2010 1220   PHURINE 6.0 11/05/2010 1220   GLUCOSEU 100 (A) 11/05/2010 1220   HGBUR NEGATIVE 11/05/2010 1220   BILIRUBINUR NEGATIVE 11/05/2010 1220   KETONESUR NEGATIVE 11/05/2010 1220   PROTEINUR NEGATIVE 11/05/2010 1220   UROBILINOGEN 0.2 11/05/2010 1220   NITRITE NEGATIVE 11/05/2010 1220   LEUKOCYTESUR  11/05/2010 1220    NEGATIVE MICROSCOPIC NOT DONE ON URINES WITH NEGATIVE PROTEIN, BLOOD, LEUKOCYTES, NITRITE, OR GLUCOSE <1000 mg/dL.    _______________________________________________ Hospitalist was called for admission for   Closed fracture of right hip, initial encounter   Atrial fibrillation with RVR      The following Work up has been ordered so far:  Orders Placed This Encounter  Procedures   Critical Care   MRSA Next Gen by PCR, Nasal   DG Hip Unilat With Pelvis 2-3 Views Right   DG Knee Complete 4 Views Right   DG Lumbar Spine Complete   DG Femur Min 2 Views Right   DG Chest 1 View   CBC with Differential   Basic metabolic panel   Cardiac Monitoring   Refer to Sidebar Report - CHG cloths Sidebar   Patient education (specify): - [facility] Daily CHG Bathing   Consult to orthopedic surgery   Consult to hospitalist  [facility] for [facility] or [facility]   ED EKG   EKG  12-Lead   Insert peripheral IV   Admit to Inpatient (patient's expected length of stay will be greater than 2 midnights or inpatient only procedure)  OTHER Significant initial  Findings:  labs showing:  Recent Labs  Lab 01/18/22 1421 01/18/22 2216  NA 133* 135  K 3.8 3.3*  CO2 24 24  GLUCOSE 98 127*  BUN 10 13  CREATININE 0.85 0.88  CALCIUM 10.2 8.8*  MG  --  2.3  PHOS  --  2.5    Cr   stable,   Lab Results  Component Value Date   CREATININE 0.85 01/18/2022   CREATININE 0.92 10/20/2019   CREATININE 0.96 10/17/2019    Recent Labs  Lab 01/18/22 2216  AST 25  ALT 21  ALKPHOS 101  BILITOT 1.0  PROT 7.2  ALBUMIN 3.3*   Lab Results  Component Value Date   CALCIUM 8.8 (L) 01/18/2022   PHOS 2.5 01/18/2022       Plt: Lab Results  Component Value Date   PLT 208 01/18/2022    Recent Labs  Lab 01/18/22 1421  WBC 9.2  NEUTROABS 7.5  HGB 12.4  HCT 36.5  MCV 93.1  PLT 208    HG/HCT stable     Component Value Date/Time   HGB 12.4 01/18/2022 1421   HGB 12.8 01/03/2022 1520   HCT 36.5 01/18/2022 1421   HCT 37.5 01/03/2022 1520   MCV 93.1 01/18/2022 1421   MCV 93 01/03/2022 1520     Cardiac Panel (last 3 results) Recent Labs    01/18/22 2216  CKTOTAL 81         Cultures: No results found for: "SDES", "SPECREQUEST", "CULT", "REPTSTATUS"   Radiological Exams on Admission: DG Chest 1 View  Result Date: 01/18/2022 CLINICAL DATA:  Fall, possible preop EXAM: CHEST  1 VIEW COMPARISON:  Radiograph 07/26/2014 FINDINGS: Mildly enlarged cardiac silhouette. No focal airspace disease. No pleural effusion. No pneumothorax. No acute osseous abnormality. IMPRESSION: Cardiomegaly.  No focal airspace consolidation. No acute osseous abnormality on single frontal view of the chest. Electronically Signed   By: Maurine Simmering M.D.   On: 01/18/2022 14:28   DG Femur Min 2 Views Right  Result Date: 01/18/2022 CLINICAL DATA:  Recent fall. EXAM: RIGHT FEMUR 2 VIEWS  COMPARISON:  None Available. FINDINGS: Acute impacted subcapital right femoral neck fracture. No dislocation. Degenerative changes of the right knee. The right hip joint space is relatively preserved. Soft tissues are unremarkable. IMPRESSION: 1. Acute impacted subcapital right femoral neck fracture. Electronically Signed   By: Titus Dubin M.D.   On: 01/18/2022 14:26   DG Lumbar Spine Complete  Result Date: 01/18/2022 CLINICAL DATA:  Recent fall. EXAM: LUMBAR SPINE - COMPLETE 4+ VIEW COMPARISON:  Lumbar spine x-rays dated March 31, 2009. FINDINGS: There is no evidence of lumbar spine fracture. Severe asymmetric disc height loss at L1-L2 and L5-S1. Moderate disc height loss at L4-L5. Severe left facet arthropathy at L5-S1. Findings have progressed since 2010. Incompletely visualized right femoral neck fracture. IMPRESSION: 1. No acute osseous abnormality of the lumbar spine. 2. Incompletely visualized right femoral neck fracture. Electronically Signed   By: Titus Dubin M.D.   On: 01/18/2022 14:25   DG Knee Complete 4 Views Right  Result Date: 01/18/2022 CLINICAL DATA:  Recent fall. EXAM: RIGHT KNEE - COMPLETE 4+ VIEW COMPARISON:  None Available. FINDINGS: No acute fracture or dislocation. No significant joint effusion. Tricompartmental degenerative changes with severe lateral compartment joint space narrowing. Osteopenia. Soft tissues are unremarkable. IMPRESSION: 1. No acute osseous abnormality. 2. Tricompartmental degenerative changes, severe in the lateral compartment. Electronically Signed   By: Orville Govern.D.  On: 01/18/2022 14:22   DG Hip Unilat With Pelvis 2-3 Views Right  Result Date: 01/18/2022 CLINICAL DATA:  Pain.  Status post fall. EXAM: DG HIP (WITH OR WITHOUT PELVIS) 2-3V RIGHT COMPARISON:  None Available. FINDINGS: There is an acute, impacted, subcapital right femoral neck fracture. No dislocation. No additional fractures identified. Lumbar scoliosis and degenerative disc  disease noted. IMPRESSION: Acute, impacted, subcapital right femoral neck fracture. Electronically Signed   By: Kerby Moors M.D.   On: 01/18/2022 14:16   _______________________________________________________________________________________________________ Latest  Blood pressure 109/86, pulse (!) 109, temperature 98.4 F (36.9 C), temperature source Oral, resp. rate (!) 25, height '5\' 1"'$  (1.549 m), weight 50.3 kg, SpO2 98 %.   Vitals  labs and radiology finding personally reviewed  Review of Systems:    Pertinent positives include: right hip pain  Constitutional:  No weight loss, night sweats, Fevers, chills, fatigue, weight loss  HEENT:  No headaches, Difficulty swallowing,Tooth/dental problems,Sore throat,  No sneezing, itching, ear ache, nasal congestion, post nasal drip,  Cardio-vascular:  No chest pain, Orthopnea, PND, anasarca, dizziness, palpitations.no Bilateral lower extremity swelling  GI:  No heartburn, indigestion, abdominal pain, nausea, vomiting, diarrhea, change in bowel habits, loss of appetite, melena, blood in stool, hematemesis Resp:  no shortness of breath at rest. No dyspnea on exertion, No excess mucus, no productive cough, No non-productive cough, No coughing up of blood.No change in color of mucus.No wheezing. Skin:  no rash or lesions. No jaundice GU:  no dysuria, change in color of urine, no urgency or frequency. No straining to urinate.  No flank pain.  Musculoskeletal:  No joint pain or no joint swelling. No decreased range of motion. No back pain.  Psych:  No change in mood or affect. No depression or anxiety. No memory loss.  Neuro: no localizing neurological complaints, no tingling, no weakness, no double vision, no gait abnormality, no slurred speech, no confusion  All systems reviewed and apart from Rhonda Marshall all are negative _______________________________________________________________________________________________ Past Medical History:   Past  Medical History:  Diagnosis Date   Anxiety    Atrial fibrillation ([place])    newly diagnosed 10/2010   Family history of adverse reaction to anesthesia    Sister's BP drops    Hypertension    Rheumatoid arthritis(714.0)    Subclinical hyperthyroidism      Past Surgical History:  Procedure Laterality Date   Arm surgery     fracture, right arm   CARDIOVERSION N/A 08/10/2014   Procedure: CARDIOVERSION;  Surgeon: Dorothy Spark, MD;  Location: [facility];  Service: Cardiovascular;  Laterality: N/A;   CATARACT EXTRACTION     DIRECT LARYNGOSCOPY N/A 09/02/2019   Procedure: DIRECT LARYNGOSCOPY WITH BIOPSY;  Surgeon: Rozetta Nunnery, MD;  Location: [facility];  Service: ENT;  Laterality: N/A;   IR GASTROSTOMY TUBE MOD SED  10/17/2019   IR GASTROSTOMY TUBE REMOVAL  01/03/2020   TUBAL LIGATION      Social History:  Ambulatory independently     reports that she quit smoking about 2 years ago. Her smoking use included cigarettes. She started smoking about 56 years ago. She has a 13.50 pack-year smoking history. She has never used smokeless tobacco. She reports that she does not currently use alcohol. She reports that she does not use drugs.    Family History:  Family History  Problem Relation Age of Onset   Heart attack Mother 79       pacemaker age 17   Hypothyroidism  Mother    Cancer Father        colon and prostate   Diabetes Brother    CAD Brother 82   Cancer Brother        Renal   Hypothyroidism Sister    ______________________________________________________________________________________________ Allergies: Allergies  Allergen Reactions   Clindamycin/Lincomycin Rash   Latex Rash    Latex gloves     Prior to Admission medications   Medication Sig Start Date End Date Taking? Authorizing Provider  ALPRAZolam Duanne Moron) 1 MG tablet Take 1 mg by mouth 3 (three) times daily as needed for anxiety.  05/11/14   [provider]  apixaban  (ELIQUIS) 5 MG TABS tablet Take 1 tablet (5 mg total) by mouth 2 (two) times daily. NEEDS LABS, PLEASE COME TO OFFICE ASAP 10/23/21   Minus Breeding, MD  cetirizine (ZYRTEC) 10 MG tablet Take 10 mg by mouth daily. 04/30/21   [provider]  diltiazem (CARDIZEM CD) 240 MG 24 hr capsule Take 1 capsule (240 mg total) by mouth daily. OFFICE VISIT NEEDED 05/01/21 07/30/21  Minus Breeding, MD  ibuprofen (ADVIL) 600 MG tablet Take 600 mg by mouth every 6 (six) hours as needed. Patient not taking: Reported on 09/05/2021    [provider]  meloxicam (MOBIC) 7.5 MG tablet Take 7.5 mg by mouth daily as needed. 12/13/20   [provider]  metoprolol (TOPROL-XL) 200 MG 24 hr tablet Take 1 tablet (200 mg total) by mouth daily. 05/01/21   Minus Breeding, MD  Multiple Vitamin (MULTIVITAMIN WITH MINERALS) TABS tablet Take 1 tablet by mouth daily.     [provider]  oxyCODONE-acetaminophen (PERCOCET) 10-325 MG tablet Take 1 tablet by mouth 4 (four) times daily as needed for pain.    [provider]  propylthiouracil (PTU) 50 MG tablet Take 1 tablet (50 mg total) by mouth 3 (three) times daily. 11/26/21   Elayne Snare, MD    ___________________________________________________________________________________________________ Physical Exam:    01/18/2022    8:40 PM 01/18/2022    7:54 PM 01/18/2022    7:15 PM  Vitals with BMI  Height '5\' 1"'$     Weight 110 lbs 14 oz    BMI 50.09    Systolic [phone]  Diastolic 86 89 82  Pulse   109     1. General:  in No Acute distress  Chronically ill   -appearing 2. Psychological: Alert and   Oriented 3. Head/ENT:    Dry Mucous Membranes                          Head Non traumatic, neck supple                          Poor Dentition 4. SKIN:  decreased Skin turgor,  Skin clean Dry and intact no rash 5. Heart: Regular rate and rhythm no  Murmur, no Rub or gallop 6. Lungs:  no wheezes or crackles   7. Abdomen: Soft,  non-tender,  Non distended bowel sounds present 8. Lower extremities: no clubbing, cyanosis, no  edema 9. Neurologically Grossly intact, moving all 4 extremities equally   10. MSK: Normal range of motion except in right hip    Chart has been reviewed  ______________________________________________________________________________________________  Assessment/Plan 71 y.o. female with medical history significant of A-fib on Eliquis Hypothyroidism bilateral carotid bruit, rheumatoid arthritis, LBBB, systolic CHF EF 16% Hx of throat cancer  Admitted for  Closed fracture of right hip, initial encounter    Atrial fibrillation with RVR     Present on Admission:  Hip fracture (HCC)  Dehydration  Hyperthyroidism  Malignant neoplasm of glottis (HCC)  Rheumatoid arthritis (HCC)  Paroxysmal atrial fibrillation with RVR (HCC)  Elevated troponin  Hypokalemia     Dehydration Will rehydrate follow fluid status  Hip fracture ([place])  - management as per orthopedics,  plan to operate  in  a.m.    Keep nothing by mouth post midnight. Patient  on anticoagulation    on hold Ordered type and screen,   order a vitamin D level  Patient at baseline  unable to walk a flight of stairs or 100 feet   due to shortness of breath.    Patient denies any chest pain or shortness of breath currently    ECG showing chronic LBBB     Given advanced age patient is at least moderate  risk * which has been discussed with family but at this point no furthther cardiac workup is indicated.    will order echo obtain CE    Cardiology consult for further pre-op clearance given extensive cardiac disease     Hyperthyroidism Check TSH T3-4 continue PTU  Malignant neoplasm of glottis (HCC) Patient need to resume follow-up as an outpatient  NICM (nonischemic cardiomyopathy) ([place]) Repeat echogram current appears to be somewhat on the dry side gently rehydrate  Rheumatoid arthritis ([place]) Hold NSAIDs.    Paroxysmal atrial  fibrillation with RVR (HCC) Hold Eliquis for tonight.  Patient was initially started on diltiazem drip but did not tolerate given hypotension Will DC for now    Discussed with cardiology they recommend against using amiodarone instead would resume p.o. dose of metoprolol at lower dose can start at 25 every 6 and see if can titrate up for heart rate. If tolertes 25 tonight can try 50 mg next dose Patient at baseline is blood pressure on low 100s Cardiology will see in consult in a.m. Last dose of Eliquis was yesterday  Elevated troponin Mildly in the setting of tachycardia most likely demand we will continue to cycle  Echo in a.m. EKG showing left bundle branch block which is old  Hypokalemia we will replace and follow   Other plan as per orders.  DVT prophylaxis:  SCD       Code Status:    Code Status: Not on file FULL CODE  as per patient  I had personally discussed CODE STATUS with patient      Family Communication:   Family not at  Bedside  plan of care was discussed on the phone with   Son,   Disposition Plan:     likely will need placement for rehabilitation                         Following barriers for discharge:                                                          Pain controlled with PO medications  Will need consultants to evaluate patient prior to discharge                         Consults called:  Cardiology will see in consult, orthopedics are aware May need to move OR time to afternoon tomorrow versus Monday pending when cardiology able to see patient  Admission status:  ED Disposition     ED Disposition  [place]: [facility] [[id number]]  Level of Care: Stepdown [14]  Admit to SDU based on following criteria: Cardiac Instability:  Patients experiencing chest pain, unconfirmed MI and stable, arrhythmias and CHF requiring medical management  and potentially compromising patient's stability  May [facility] to [facility] or [facility] if equivalent level of care is available:: Yes  Interfacility transfer: Yes  Covid Evaluation: Asymptomatic - no recent exposure (last 10 days) testing not required  Diagnosis: Hip fracture [facility]) [[id number]]  Admitting Physician: Marcell Anger [[id number]]  Attending Physician: Marcell Anger [[id number]]  Certification:: I certify this patient will need inpatient services for at least 2 midnights  Estimated Length of Stay: 2            I Expect 2 midnight stay secondary to severity of patient's current illness need for inpatient interventions justified by the following:  hemodynamic instability despite optimal treatment (tachycardia  hypotension )  Severe lab/radiological/exam abnormalities including:   Right hip fracture A-fib of RVR hypotension and extensive comorbidities including: CHF    Chronic anticoagulation  That are currently affecting medical management.   I expect  patient to be hospitalized for 2 midnights requiring inpatient medical care.  Patient is at high risk for adverse outcome (such as loss of life or disability) if not treated.  Indication for inpatient stay as follows:   Need for operative/procedural  intervention     Need for  IV fluids, IV rate controling medications   IV pain medications,     Level of care        progressive tele indefinitely please discontinue once patient no longer qualifies COVID-19 Labs      Rhonda Marshall 01/19/2022, 12:29 AM    [facility]     after 2 AM please page floor coverage PA If 7AM-7PM, please contact the day team taking care of the patient using [email or web address]   Patient was evaluated in the context of the global COVID-19 pandemic, which necessitated consideration that the patient might be at risk for infection with the SARS-CoV-2 virus that causes COVID-19. Institutional protocols and algorithms  that pertain to the evaluation of patients at risk for COVID-19 are in a state of rapid change based on information released by regulatory bodies including the CDC and federal and state organizations. These policies and algorithms were followed during the patient's care.

## 2022-01-18 NOTE — ED Provider Notes (Signed)
[facility]ERGENC[facility] DEPT Provider Note   CSN: [id number] Arrival date & time: 01/18/22  1158     History  Chief Complaint  Patient presents with   Rhonda Marshall is a 71 y.o. female.  Right hip pain after mechanical fall 4 days ago.  She is on a blood thinner but did not hit her head or lose consciousness.  She has been able to ambulate but with discomfort especially in the right hip.  Has chronic right knee pain.  Denies any abdominal pain, back pain, nausea, vomiting.  The makes it better.  Walking makes it worse.  She also has a history of rheumatoid arthritis.  Denies any weakness, numbness, chills.  No chest pain or shortness of breath.  No other extremity pain.  The history is provided by the patient.       Home Medications Prior to Admission medications   Medication Sig Start Date End Date Taking? Authorizing Provider  ALPRAZolam Duanne Moron) 1 MG tablet Take 1 mg by mouth 3 (three) times daily as needed for anxiety.  05/11/14   [provider]  apixaban (ELIQUIS) 5 MG TABS tablet Take 1 tablet (5 mg total) by mouth 2 (two) times daily. NEEDS LABS, PLEASE COME TO OFFICE ASAP 10/23/21   Minus Breeding, MD  cetirizine (ZYRTEC) 10 MG tablet Take 10 mg by mouth daily. 04/30/21   [provider]  diltiazem (CARDIZEM CD) 240 MG 24 hr capsule Take 1 capsule (240 mg total) by mouth daily. OFFICE VISIT NEEDED 05/01/21 07/30/21  Minus Breeding, MD  ibuprofen (ADVIL) 600 MG tablet Take 600 mg by mouth every 6 (six) hours as needed. Patient not taking: Reported on 09/05/2021    [provider]  meloxicam (MOBIC) 7.5 MG tablet Take 7.5 mg by mouth daily as needed. 12/13/20   [provider]  metoprolol (TOPROL-XL) 200 MG 24 hr tablet Take 1 tablet (200 mg total) by mouth daily. 05/01/21   Minus Breeding, MD  Multiple Vitamin (MULTIVITAMIN WITH MINERALS) TABS tablet Take 1 tablet by mouth daily.     [provider]   oxyCODONE-acetaminophen (PERCOCET) 10-325 MG tablet Take 1 tablet by mouth 4 (four) times daily as needed for pain.    [provider]  propylthiouracil (PTU) 50 MG tablet Take 1 tablet (50 mg total) by mouth 3 (three) times daily. 11/26/21   Elayne Snare, MD      Allergies    Clindamycin/lincomycin and Latex    Review of Systems   Review of Systems  Physical Exam Updated Vital Signs BP (!) 133/97   Pulse (!) 136   Temp 97.9 F (36.6 C) (Oral)   Resp 18   SpO2 95%  Physical Exam Vitals and nursing note reviewed.  Constitutional:      General: She is not in acute distress.    Appearance: She is well-developed.  HENT:     Head: Normocephalic and atraumatic.     Nose: Nose normal.     Mouth/Throat:     Mouth: Mucous membranes are moist.  Eyes:     Extraocular Movements: Extraocular movements intact.     Conjunctiva/sclera: Conjunctivae normal.     Pupils: Pupils are equal, round, and reactive to light.  Cardiovascular:     Rate and Rhythm: Normal rate and regular rhythm.     Pulses: Normal pulses.     Heart sounds: No murmur heard. Pulmonary:     Effort: Pulmonary effort is normal. No respiratory distress.  Breath sounds: Normal breath sounds.  Abdominal:     Palpations: Abdomen is soft.     Tenderness: There is no abdominal tenderness.  Musculoskeletal:        General: Tenderness present. No swelling. Normal range of motion.     Cervical back: Normal range of motion and neck supple. No rigidity or tenderness.     Comments: Tenderness to the right hip  Skin:    General: Skin is warm and dry.     Capillary Refill: Capillary refill takes less than 2 seconds.  Neurological:     General: No focal deficit present.     Mental Status: She is alert.     Cranial Nerves: No cranial nerve deficit.     Sensory: No sensory deficit.     Motor: No weakness.     Coordination: Coordination normal.  Psychiatric:        Mood and Affect: Mood normal.     ED Results /  Procedures / Treatments   Labs (all labs ordered are listed, but only abnormal results are displayed) Labs Reviewed  CBC WITH DIFFERENTIAL/PLATELET  BASIC METABOLIC PANEL    EKG None  Radiology DG Chest 1 View  Result Date: 01/18/2022 CLINICAL DATA:  Fall, possible preop EXAM: CHEST  1 VIEW COMPARISON:  Radiograph 07/26/2014 FINDINGS: Mildly enlarged cardiac silhouette. No focal airspace disease. No pleural effusion. No pneumothorax. No acute osseous abnormality. IMPRESSION: Cardiomegaly.  No focal airspace consolidation. No acute osseous abnormality on single frontal view of the chest. Electronically Signed   By: Maurine Simmering M.D.   On: 01/18/2022 14:28   DG Femur Min 2 Views Right  Result Date: 01/18/2022 CLINICAL DATA:  Recent fall. EXAM: RIGHT FEMUR 2 VIEWS COMPARISON:  None Available. FINDINGS: Acute impacted subcapital right femoral neck fracture. No dislocation. Degenerative changes of the right knee. The right hip joint space is relatively preserved. Soft tissues are unremarkable. IMPRESSION: 1. Acute impacted subcapital right femoral neck fracture. Electronically Signed   By: Titus Dubin M.D.   On: 01/18/2022 14:26   DG Lumbar Spine Complete  Result Date: 01/18/2022 CLINICAL DATA:  Recent fall. EXAM: LUMBAR SPINE - COMPLETE 4+ VIEW COMPARISON:  Lumbar spine x-rays dated March 31, 2009. FINDINGS: There is no evidence of lumbar spine fracture. Severe asymmetric disc height loss at L1-L2 and L5-S1. Moderate disc height loss at L4-L5. Severe left facet arthropathy at L5-S1. Findings have progressed since 2010. Incompletely visualized right femoral neck fracture. IMPRESSION: 1. No acute osseous abnormality of the lumbar spine. 2. Incompletely visualized right femoral neck fracture. Electronically Signed   By: Titus Dubin M.D.   On: 01/18/2022 14:25   DG Knee Complete 4 Views Right  Result Date: 01/18/2022 CLINICAL DATA:  Recent fall. EXAM: RIGHT KNEE - COMPLETE 4+ VIEW  COMPARISON:  None Available. FINDINGS: No acute fracture or dislocation. No significant joint effusion. Tricompartmental degenerative changes with severe lateral compartment joint space narrowing. Osteopenia. Soft tissues are unremarkable. IMPRESSION: 1. No acute osseous abnormality. 2. Tricompartmental degenerative changes, severe in the lateral compartment. Electronically Signed   By: Titus Dubin M.D.   On: 01/18/2022 14:22   DG Hip Unilat With Pelvis 2-3 Views Right  Result Date: 01/18/2022 CLINICAL DATA:  Pain.  Status post fall. EXAM: DG HIP (WITH OR WITHOUT PELVIS) 2-3V RIGHT COMPARISON:  None Available. FINDINGS: There is an acute, impacted, subcapital right femoral neck fracture. No dislocation. No additional fractures identified. Lumbar scoliosis and degenerative disc disease noted. IMPRESSION: Acute, impacted,  subcapital right femoral neck fracture. Electronically Signed   By: Kerby Moors M.D.   On: 01/18/2022 14:16    Procedures .Critical Care  Performed by: Lennice Sites, DO Authorized by: Lennice Sites, DO   Critical care provider statement:    Critical care time (minutes):  40   Critical care was necessary to treat or prevent imminent or life-threatening deterioration of the following conditions: atrial fibrillation with RVR.   Critical care was time spent personally by me on the following activities:  Blood draw for specimens, discussions with primary provider, development of treatment plan with patient or surrogate, discussions with consultants, evaluation of patient's response to treatment, examination of patient, obtaining history from patient or surrogate, ordering and review of laboratory studies, ordering and performing treatments and interventions, ordering and review of radiographic studies, pulse oximetry, re-evaluation of patient's condition and review of old charts   Care discussed with: admitting provider       Medications Ordered in ED Medications  diltiazem  (CARDIZEM) 1 mg/mL load via infusion 10 mg (has no administration in time range)    And  diltiazem (CARDIZEM) 125 mg in dextrose 5% 125 mL (1 mg/mL) infusion (has no administration in time range)  oxyCODONE (Oxy IR/ROXICODONE) immediate release tablet 5 mg (5 mg Oral Given 01/18/22 1316)  cyclobenzaprine (FLEXERIL) tablet 5 mg (5 mg Oral Given 01/18/22 1316)  HYDROmorphone (DILAUDID) injection 1 mg (1 mg Intravenous Given 01/18/22 1427)    ED Course/ Medical Decision Making/ A&P                           Medical Decision Making Amount and/or Complexity of Data Reviewed Labs: ordered. Radiology: ordered.  Risk Prescription drug management. Decision regarding hospitalization.   Jasiya Onorato here with right hip pain after fall 4 days ago.  Did not hit her head or lose consciousness.  She has a history of A-fib on Eliquis.  Has a history of rheumatoid arthritis.  She has been able to ambulate but with discomfort.  Pain mostly in the right hip.  Denies any back pain, numbness, weakness, tingling, chest pain, shortness of breath.  She has no midline spinal pain.  She is tender in the right hip.  We will get x-rays of the right lower extremity.  I have no concern for neck or head injury.  We will give a dose of oxycodone and Flexeril.  Differential diagnosis likely contusion versus spasm versus fracture.  Per my review and interpretation of x-ray she does have a femoral neck fracture on the right.  Otherwise x-rays with no acute injuries.  She appeared to go into atrial fibrillation with RVR on my reevaluation, EKG confirmed atrial fibrillation with RVR.  She took her last dose of Eliquis yesterday.  She has not taken her diltiazem today.  I will start her on IV diltiazem bolus and infusion.  I talked with Dr. Mardelle Matte with orthopedics who will likely do surgery tomorrow.  He is okay with admission to [facility].  We will have patient admitted to medicine service for further care.  Blood work including  CBC and BMP were obtained for medical clearance and showed no significant leukocytosis, anemia, electrolyte abnormality.  This chart was dictated using voice recognition software.  Despite best efforts to proofread,  errors can occur which can change the documentation meaning.         Final Clinical Impression(s) / ED Diagnoses Final diagnoses:  Closed fracture of  right hip, initial encounter [facility])  Atrial fibrillation with RVR [facility])    Rx / DC Orders ED Discharge Orders     None         Lennice Sites, DO 01/18/22 1445

## 2022-01-18 NOTE — Assessment & Plan Note (Signed)
Check TSH T3-4 continue PTU

## 2022-01-18 NOTE — ED Triage Notes (Signed)
She states that she "caught my flip flop on a root" in her yard, which caused her to fall this Wed. She c/o persistent pain right hip/leg areas.

## 2022-01-18 NOTE — Subjective & Objective (Signed)
Presented with Right hip pain after a mechanical fall 4 days ago No LOC no head injury Patient has history of A-fib and is on anticoagulation with Eliquis she is able to bear weight but it still painful. Rate controlled usually with Cardizem and Toprol Patient also has history of hyperthyroidism for which she takes PTU

## 2022-01-18 NOTE — Assessment & Plan Note (Signed)
Patient need to resume follow-up as an outpatient

## 2022-01-18 NOTE — ED Notes (Signed)
Pt in xray

## 2022-01-18 NOTE — Progress Notes (Signed)
Paged by hospitalist for advice on AF management peri-op with patient admitted for R femoral head fracture. She is currently asx from AF but in AF/RVR rate 120s. She is currently off all nodal blocking agents. At home prescribed toprol XL 200 mg daily with diltiazem CD 240 mg daily. They have attempted dilt gtt and she had asx hypotension. Previous EF 45-50% (05/16/19). She has not bee in NSR (sinus brady in 08/10/14) but since then has been in longstanding persistent->permanent AF. She should be able to tolerate nodal blocking agents. sBP on recent OP visits in mid-low 100s. Would trial metop tartrate 25 mg q6h with plan for uptitration to 50 mg q6h preop. Short acting dilt IR could be used once she is on max dose of metop. I don't think an AAD would be useful since she is in permanent AF and there is little chance we would keep her in NSR after 7 years of permanent AF. She will also need in person evaluation for pre op surgery.

## 2022-01-18 NOTE — Assessment & Plan Note (Signed)
Repeat echogram current appears to be somewhat on the dry side gently rehydrate

## 2022-01-18 NOTE — Assessment & Plan Note (Signed)
Hold NSAIDs.

## 2022-01-18 NOTE — Assessment & Plan Note (Signed)
-   management as per orthopedics,  plan to operate  in  a.m.    Keep nothing by mouth post midnight. Patient  on anticoagulation    on hold Ordered type and screen,   order a vitamin D level  Patient at baseline  unable to walk a flight of stairs or 100 feet   due to shortness of breath.    Patient denies any chest pain or shortness of breath currently    ECG showing chronic LBBB     Given advanced age patient is at least moderate  risk * which has been discussed with family but at this point no furthther cardiac workup is indicated.    will order echo obtain CE    Cardiology consult for further pre-op clearance given extensive cardiac disease

## 2022-01-18 NOTE — Assessment & Plan Note (Addendum)
Hold Eliquis for tonight.  Patient was initially started on diltiazem drip but did not tolerate given hypotension Will DC for now    Discussed with cardiology they recommend against using amiodarone instead would resume p.o. dose of metoprolol at lower dose can start at 25 every 6 and see if can titrate up for heart rate. If tolertes 25 tonight can try 50 mg next dose Patient at baseline is blood pressure on low 100s Cardiology will see in consult in a.m. Last dose of Eliquis was yesterday

## 2022-01-18 NOTE — Progress Notes (Signed)
71 yo female s/p mechanical fall 4 days ago who presented to [facility] today 2/2 hip pain.  Pt has confirmed right hip fracture.  Case discussed with Dr Mardelle Matte by [facility] with plans for surgery tomorrow.   Pt is on eliquis for a fib last dose yesterday, ortho aware. Also noted to be in a fib rvr now in dilt gtt-pt did not take her cardizem today  97.9 HR 120'S BP 133/97  RR 18  SA02 95RA HGB 12.4  NPO AFTER MIDNIGHT

## 2022-01-19 ENCOUNTER — Encounter (HOSPITAL_COMMUNITY)
Admission: EM | Disposition: A | Discharge status: Skilled Nursing Facility | Payer: Self-pay | Source: Home / Self Care | Attending: Internal Medicine

## 2022-01-19 ENCOUNTER — Inpatient Hospital Stay (HOSPITAL_COMMUNITY): Payer: 59

## 2022-01-19 ENCOUNTER — Inpatient Hospital Stay (HOSPITAL_COMMUNITY): Payer: 59 | Admitting: Anesthesiology

## 2022-01-19 DIAGNOSIS — I447 Left bundle-branch block, unspecified: Secondary | ICD-10-CM | POA: Diagnosis not present

## 2022-01-19 DIAGNOSIS — I517 Cardiomegaly: Secondary | ICD-10-CM | POA: Diagnosis not present

## 2022-01-19 DIAGNOSIS — R778 Other specified abnormalities of plasma proteins: Secondary | ICD-10-CM | POA: Diagnosis present

## 2022-01-19 DIAGNOSIS — I5022 Chronic systolic (congestive) heart failure: Secondary | ICD-10-CM | POA: Diagnosis not present

## 2022-01-19 DIAGNOSIS — E876 Hypokalemia: Secondary | ICD-10-CM | POA: Diagnosis present

## 2022-01-19 DIAGNOSIS — Z87891 Personal history of nicotine dependence: Secondary | ICD-10-CM

## 2022-01-19 DIAGNOSIS — S72001A Fracture of unspecified part of neck of right femur, initial encounter for closed fracture: Secondary | ICD-10-CM | POA: Diagnosis not present

## 2022-01-19 DIAGNOSIS — I110 Hypertensive heart disease with heart failure: Secondary | ICD-10-CM

## 2022-01-19 DIAGNOSIS — I509 Heart failure, unspecified: Secondary | ICD-10-CM

## 2022-01-19 DIAGNOSIS — Z01810 Encounter for preprocedural cardiovascular examination: Secondary | ICD-10-CM

## 2022-01-19 HISTORY — PX: HIP ARTHROPLASTY: SHX981

## 2022-01-19 LAB — COMPREHENSIVE METABOLIC PANEL
ALT: 24 U/L (ref 0–44)
AST: 24 U/L (ref 15–41)
Albumin: 3.4 g/dL — ABNORMAL LOW (ref 3.5–5.0)
Alkaline Phosphatase: 103 U/L (ref 38–126)
Anion gap: 10 (ref 5–15)
BUN: 15 mg/dL (ref 8–23)
CO2: 24 mmol/L (ref 22–32)
Calcium: 9 mg/dL (ref 8.9–10.3)
Chloride: 101 mmol/L (ref 98–111)
Creatinine, Ser: 0.83 mg/dL (ref 0.44–1.00)
GFR, Estimated: >60 mL/min (ref >60)
Glucose, Bld: 124 mg/dL — ABNORMAL HIGH (ref 70–99)
Potassium: 4.1 mmol/L (ref 3.5–5.1)
Sodium: 135 mmol/L (ref 135–145)
Total Bilirubin: 0.9 mg/dL (ref 0.3–1.2)
Total Protein: 7.2 g/dL (ref 6.5–8.1)

## 2022-01-19 LAB — ABO/RH: ABO/RH(D): O POS

## 2022-01-19 LAB — TYPE AND SCREEN
ABO/RH(D): O POS
Antibody Screen: NEGATIVE

## 2022-01-19 LAB — CBC WITH DIFFERENTIAL/PLATELET
Abs Immature Granulocytes: 0.02 10*3/uL (ref 0.00–0.07)
Basophils Absolute: 0 10*3/uL (ref 0.0–0.1)
Basophils Relative: 0 %
Eosinophils Absolute: 0.1 10*3/uL (ref 0.0–0.5)
Eosinophils Relative: 1 %
HCT: 35.9 % — ABNORMAL LOW (ref 36.0–46.0)
Hemoglobin: 12.1 g/dL (ref 12.0–15.0)
Immature Granulocytes: 0 %
Lymphocytes Relative: 13 %
Lymphs Abs: 0.9 10*3/uL (ref 0.7–4.0)
MCH: 31.9 pg (ref 26.0–34.0)
MCHC: 33.7 g/dL (ref 30.0–36.0)
MCV: 94.7 fL (ref 80.0–100.0)
Monocytes Absolute: 0.7 10*3/uL (ref 0.1–1.0)
Monocytes Relative: 10 %
Neutro Abs: 5.6 10*3/uL (ref 1.7–7.7)
Neutrophils Relative %: 76 %
Platelets: 226 10*3/uL (ref 150–400)
RBC: 3.79 MIL/uL — ABNORMAL LOW (ref 3.87–5.11)
RDW: 13.7 % (ref 11.5–15.5)
WBC: 7.4 10*3/uL (ref 4.0–10.5)
nRBC: 0 % (ref 0.0–0.2)

## 2022-01-19 LAB — ECHOCARDIOGRAM COMPLETE
Calc EF: 36.6 %
Height: 61 in
MV M vel: 4.4 m/s
MV Peak grad: 77.4 mmHg
Radius: 0.3 cm
S' Lateral: 3.6 cm
Single Plane A2C EF: 31 %
Single Plane A4C EF: 43.3 %
Weight: 1774.26 oz

## 2022-01-19 LAB — RESP PANEL BY RT-PCR (FLU A&B, COVID) ARPGX2
Influenza A by PCR: NEGATIVE
Influenza B by PCR: NEGATIVE
SARS Coronavirus 2 by RT PCR: NEGATIVE

## 2022-01-19 LAB — VITAMIN D 25 HYDROXY (VIT D DEFICIENCY, FRACTURES): Vit D, 25-Hydroxy: 33.96 ng/mL (ref 30–100)

## 2022-01-19 LAB — TROPONIN I (HIGH SENSITIVITY)
Troponin I (High Sensitivity): 22 ng/L — ABNORMAL HIGH (ref <18)
Troponin I (High Sensitivity): 22 ng/L — ABNORMAL HIGH (ref <18)

## 2022-01-19 LAB — T4, FREE: Free T4: 1.02 ng/dL (ref 0.61–1.12)

## 2022-01-19 LAB — TSH: TSH: 1.746 u[IU]/mL (ref 0.350–4.500)

## 2022-01-19 SURGERY — HEMIARTHROPLASTY, HIP, DIRECT ANTERIOR APPROACH, FOR FRACTURE
Anesthesia: General | Site: Hip | Laterality: Right

## 2022-01-19 MED ORDER — POTASSIUM CHLORIDE IN NACL 20-0.45 MEQ/L-% IV SOLN
INTRAVENOUS | Status: DC
  Filled 2022-01-19 (×2): qty 1000

## 2022-01-19 MED ORDER — OXYCODONE HCL 5 MG/5ML PO SOLN: 5.0000 mg | Freq: Once | ORAL | Status: DC | PRN

## 2022-01-19 MED ORDER — VANCOMYCIN HCL 1000 MG IV SOLR
INTRAVENOUS | Status: AC
Start: 2022-01-19 — End: ?
  Filled 2022-01-19: qty 20

## 2022-01-19 MED ORDER — ONDANSETRON HCL 4 MG/2ML IJ SOLN: 4.0000 mg | Freq: Four times a day (QID) | INTRAMUSCULAR | Status: DC | PRN

## 2022-01-19 MED ORDER — POLYETHYLENE GLYCOL 3350 17 G PO PACK: 17.0000 g | PACK | Freq: Every day | ORAL | Status: DC | PRN

## 2022-01-19 MED ORDER — TRANEXAMIC ACID-NACL 1000-0.7 MG/100ML-% IV SOLN
INTRAVENOUS | Status: AC
  Filled 2022-01-19: qty 100

## 2022-01-19 MED ORDER — ONDANSETRON HCL 4 MG/2ML IJ SOLN
4.0000 mg | Freq: Once | INTRAMUSCULAR | Status: DC | PRN
Start: 2022-01-19 — End: 2022-01-19

## 2022-01-19 MED ORDER — PHENOL 1.4 % MT LIQD
1.0000 | OROMUCOSAL | Status: DC | PRN
  Administered 2022-01-23: 1 via OROMUCOSAL
  Filled 2022-01-19: qty 177

## 2022-01-19 MED ORDER — ROCURONIUM BROMIDE 10 MG/ML (PF) SYRINGE
PREFILLED_SYRINGE | INTRAVENOUS | Status: DC | PRN
  Administered 2022-01-19: 60 mg via INTRAVENOUS

## 2022-01-19 MED ORDER — ONDANSETRON HCL 4 MG PO TABS: 4.0000 mg | ORAL_TABLET | Freq: Four times a day (QID) | ORAL | Status: DC | PRN

## 2022-01-19 MED ORDER — PROPOFOL 10 MG/ML IV BOLUS
INTRAVENOUS | Status: DC | PRN
  Administered 2022-01-19: 100 mg via INTRAVENOUS

## 2022-01-19 MED ORDER — DEXAMETHASONE SODIUM PHOSPHATE 10 MG/ML IJ SOLN
INTRAMUSCULAR | Status: DC | PRN
  Administered 2022-01-19: 4 mg via INTRAVENOUS

## 2022-01-19 MED ORDER — ONDANSETRON HCL 4 MG/2ML IJ SOLN
INTRAMUSCULAR | Status: DC | PRN
  Administered 2022-01-19: 4 mg via INTRAVENOUS

## 2022-01-19 MED ORDER — CEFAZOLIN SODIUM-DEXTROSE 2-4 GM/100ML-% IV SOLN
INTRAVENOUS | Status: AC
  Filled 2022-01-19: qty 100

## 2022-01-19 MED ORDER — TRANEXAMIC ACID-NACL 1000-0.7 MG/100ML-% IV SOLN
1000.0000 mg | Freq: Once | INTRAVENOUS | Status: AC
  Administered 2022-01-19: 1000 mg via INTRAVENOUS
  Filled 2022-01-19: qty 100

## 2022-01-19 MED ORDER — ROCURONIUM BROMIDE 10 MG/ML (PF) SYRINGE
PREFILLED_SYRINGE | INTRAVENOUS | Status: AC
  Filled 2022-01-19: qty 10

## 2022-01-19 MED ORDER — 0.9 % SODIUM CHLORIDE (POUR BTL) OPTIME
TOPICAL | Status: DC | PRN
  Administered 2022-01-19: 1000 mL

## 2022-01-19 MED ORDER — VASOPRESSIN 20 UNIT/ML IV SOLN
INTRAVENOUS | Status: AC
  Filled 2022-01-19: qty 1

## 2022-01-19 MED ORDER — PHENYLEPHRINE HCL-NACL 20-0.9 MG/250ML-% IV SOLN
INTRAVENOUS | Status: DC | PRN
  Administered 2022-01-19: 65 ug/min via INTRAVENOUS

## 2022-01-19 MED ORDER — TRANEXAMIC ACID-NACL 1000-0.7 MG/100ML-% IV SOLN
1000.0000 mg | INTRAVENOUS | Status: AC
  Administered 2022-01-19: 1000 mg via INTRAVENOUS

## 2022-01-19 MED ORDER — CEFAZOLIN SODIUM-DEXTROSE 2-4 GM/100ML-% IV SOLN
INTRAVENOUS | Status: DC
Start: 2022-01-19 — End: 2022-01-19
  Filled 2022-01-19: qty 100

## 2022-01-19 MED ORDER — FERROUS SULFATE 325 (65 FE) MG PO TABS
325.0000 mg | ORAL_TABLET | Freq: Three times a day (TID) | ORAL | Status: DC
  Administered 2022-01-20 – 2022-01-27 (×21): 325 mg via ORAL
  Filled 2022-01-19 (×21): qty 1

## 2022-01-19 MED ORDER — PROPOFOL 10 MG/ML IV BOLUS
INTRAVENOUS | Status: AC
  Filled 2022-01-19: qty 20

## 2022-01-19 MED ORDER — ALPRAZOLAM 1 MG PO TABS
1.0000 mg | ORAL_TABLET | Freq: Once | ORAL | Status: AC
Start: 2022-01-19 — End: 2022-01-19
  Administered 2022-01-19: 1 mg via ORAL
  Filled 2022-01-19: qty 1

## 2022-01-19 MED ORDER — ACETAMINOPHEN 10 MG/ML IV SOLN
INTRAVENOUS | Status: AC
  Filled 2022-01-19: qty 100

## 2022-01-19 MED ORDER — MIDAZOLAM HCL 2 MG/2ML IJ SOLN
INTRAMUSCULAR | Status: AC
  Filled 2022-01-19: qty 2

## 2022-01-19 MED ORDER — HYDROCODONE-ACETAMINOPHEN 5-325 MG PO TABS
1.0000 | ORAL_TABLET | ORAL | Status: DC | PRN
  Administered 2022-01-19: 2 via ORAL
  Filled 2022-01-19: qty 2

## 2022-01-19 MED ORDER — ACETAMINOPHEN 500 MG PO TABS
500.0000 mg | ORAL_TABLET | Freq: Four times a day (QID) | ORAL | Status: AC
  Administered 2022-01-19 – 2022-01-20 (×4): 500 mg via ORAL
  Filled 2022-01-19 (×4): qty 1

## 2022-01-19 MED ORDER — POVIDONE-IODINE 10 % EX SWAB: 2.0000 | Freq: Once | CUTANEOUS | Status: DC

## 2022-01-19 MED ORDER — ACETAMINOPHEN 500 MG PO TABS
ORAL_TABLET | ORAL | Status: AC
  Filled 2022-01-19: qty 2

## 2022-01-19 MED ORDER — LIDOCAINE 2% (20 MG/ML) 5 ML SYRINGE
INTRAMUSCULAR | Status: DC | PRN
  Administered 2022-01-19: 60 mg via INTRAVENOUS

## 2022-01-19 MED ORDER — HYDROMORPHONE HCL 1 MG/ML IJ SOLN
INTRAMUSCULAR | Status: AC
  Filled 2022-01-19: qty 1

## 2022-01-19 MED ORDER — ACETAMINOPHEN 325 MG PO TABS
325.0000 mg | ORAL_TABLET | Freq: Four times a day (QID) | ORAL | Status: DC | PRN
  Administered 2022-01-23 – 2022-01-24 (×2): 325 mg via ORAL
  Filled 2022-01-19 (×2): qty 1

## 2022-01-19 MED ORDER — AMISULPRIDE (ANTIEMETIC) 5 MG/2ML IV SOLN: 10.0000 mg | Freq: Once | INTRAVENOUS | Status: DC | PRN

## 2022-01-19 MED ORDER — SUGAMMADEX SODIUM 200 MG/2ML IV SOLN
INTRAVENOUS | Status: DC | PRN
  Administered 2022-01-19: 100 mg via INTRAVENOUS

## 2022-01-19 MED ORDER — MENTHOL 3 MG MT LOZG: 1.0000 | LOZENGE | OROMUCOSAL | Status: DC | PRN

## 2022-01-19 MED ORDER — METOPROLOL TARTRATE 25 MG PO TABS
50.0000 mg | ORAL_TABLET | Freq: Four times a day (QID) | ORAL | Status: DC
  Administered 2022-01-20 – 2022-01-22 (×9): 50 mg via ORAL
  Filled 2022-01-19 (×9): qty 2

## 2022-01-19 MED ORDER — BISACODYL 10 MG RE SUPP: 10.0000 mg | Freq: Every day | RECTAL | Status: DC | PRN

## 2022-01-19 MED ORDER — DILTIAZEM HCL-DEXTROSE 125-5 MG/125ML-% IV SOLN (PREMIX)
5.0000 mg/h | INTRAVENOUS | Status: DC
  Administered 2022-01-19: 10 mg/h via INTRAVENOUS
  Administered 2022-01-19: 5 mg/h via INTRAVENOUS
  Filled 2022-01-19 (×2): qty 125

## 2022-01-19 MED ORDER — METOPROLOL TARTRATE 25 MG PO TABS
25.0000 mg | ORAL_TABLET | Freq: Once | ORAL | Status: AC
  Administered 2022-01-19: 25 mg via ORAL
  Filled 2022-01-19: qty 1

## 2022-01-19 MED ORDER — LACTATED RINGERS IV SOLN: INTRAVENOUS | Status: DC | PRN

## 2022-01-19 MED ORDER — DOCUSATE SODIUM 100 MG PO CAPS
100.0000 mg | ORAL_CAPSULE | Freq: Two times a day (BID) | ORAL | Status: DC
  Administered 2022-01-21 – 2022-01-27 (×9): 100 mg via ORAL
  Filled 2022-01-19 (×13): qty 1

## 2022-01-19 MED ORDER — OXYCODONE HCL 5 MG PO TABS: 5.0000 mg | ORAL_TABLET | Freq: Once | ORAL | Status: DC | PRN

## 2022-01-19 MED ORDER — LIDOCAINE HCL (PF) 2 % IJ SOLN
INTRAMUSCULAR | Status: AC
  Filled 2022-01-19: qty 5

## 2022-01-19 MED ORDER — DEXAMETHASONE SODIUM PHOSPHATE 10 MG/ML IJ SOLN
INTRAMUSCULAR | Status: AC
  Filled 2022-01-19: qty 1

## 2022-01-19 MED ORDER — ACETAMINOPHEN 500 MG PO TABS
1000.0000 mg | ORAL_TABLET | Freq: Once | ORAL | Status: AC
  Administered 2022-01-19: 1000 mg via ORAL

## 2022-01-19 MED ORDER — SENNA 8.6 MG PO TABS
1.0000 | ORAL_TABLET | Freq: Two times a day (BID) | ORAL | Status: DC
  Administered 2022-01-21 – 2022-01-22 (×2): 8.6 mg via ORAL
  Filled 2022-01-19 (×6): qty 1

## 2022-01-19 MED ORDER — CHLORHEXIDINE GLUCONATE 4 % EX LIQD: 60.0000 mL | Freq: Once | CUTANEOUS | Status: DC

## 2022-01-19 MED ORDER — CEFAZOLIN SODIUM-DEXTROSE 2-4 GM/100ML-% IV SOLN
2.0000 g | Freq: Four times a day (QID) | INTRAVENOUS | Status: AC
  Administered 2022-01-19 – 2022-01-20 (×2): 2 g via INTRAVENOUS
  Filled 2022-01-19 (×2): qty 100

## 2022-01-19 MED ORDER — HYDROMORPHONE HCL 1 MG/ML IJ SOLN
0.2500 mg | INTRAMUSCULAR | Status: DC | PRN
  Administered 2022-01-19 (×4): 0.5 mg via INTRAVENOUS

## 2022-01-19 MED ORDER — FENTANYL CITRATE (PF) 250 MCG/5ML IJ SOLN
INTRAMUSCULAR | Status: AC
  Filled 2022-01-19: qty 5

## 2022-01-19 MED ORDER — ALUM & MAG HYDROXIDE-SIMETH 200-200-20 MG/5ML PO SUSP: 30.0000 mL | ORAL | Status: DC | PRN

## 2022-01-19 MED ORDER — ONDANSETRON HCL 4 MG/2ML IJ SOLN
INTRAMUSCULAR | Status: AC
  Filled 2022-01-19: qty 2

## 2022-01-19 MED ORDER — ORAL CARE MOUTH RINSE: 15.0000 mL | OROMUCOSAL | Status: DC | PRN

## 2022-01-19 MED ORDER — POTASSIUM CHLORIDE 10 MEQ/100ML IV SOLN
10.0000 meq | INTRAVENOUS | Status: AC
  Administered 2022-01-19 (×3): 10 meq via INTRAVENOUS
  Filled 2022-01-19 (×3): qty 100

## 2022-01-19 MED ORDER — MIDAZOLAM HCL 5 MG/5ML IJ SOLN
INTRAMUSCULAR | Status: DC | PRN
  Administered 2022-01-19: 1 mg via INTRAVENOUS

## 2022-01-19 MED ORDER — STERILE WATER FOR IRRIGATION IR SOLN
Status: DC | PRN
  Administered 2022-01-19: 2000 mL

## 2022-01-19 MED ORDER — FENTANYL CITRATE (PF) 100 MCG/2ML IJ SOLN
INTRAMUSCULAR | Status: DC | PRN
  Administered 2022-01-19: 50 ug via INTRAVENOUS
  Administered 2022-01-19: 75 ug via INTRAVENOUS
  Administered 2022-01-19 (×2): 50 ug via INTRAVENOUS
  Administered 2022-01-19: 25 ug via INTRAVENOUS

## 2022-01-19 MED ORDER — CEFAZOLIN SODIUM-DEXTROSE 2-4 GM/100ML-% IV SOLN
2.0000 g | INTRAVENOUS | Status: AC
  Administered 2022-01-19: 2 g via INTRAVENOUS

## 2022-01-19 MED ORDER — DILTIAZEM LOAD VIA INFUSION
10.0000 mg | Freq: Once | INTRAVENOUS | Status: AC
  Administered 2022-01-19: 10 mg via INTRAVENOUS
  Filled 2022-01-19: qty 10

## 2022-01-19 SURGICAL SUPPLY — 51 items
BAG COUNTER SPONGE SURGICOUNT (BAG) IMPLANT
BAG SPNG CNTER NS LX DISP (BAG)
BIT DRILL 2.0X128 (BIT) ×2 IMPLANT
BLADE SAW SGTL 73X25 THK (BLADE) ×2 IMPLANT
COVER SURGICAL LIGHT HANDLE (MISCELLANEOUS) ×2 IMPLANT
DRAPE INCISE IOBAN 66X45 STRL (DRAPES) ×4 IMPLANT
DRAPE ORTHO SPLIT 77X108 STRL (DRAPES) ×4
DRAPE POUCH INSTRU U-SHP 10X18 (DRAPES) ×2 IMPLANT
DRAPE SURG ORHT 6 SPLT 77X108 (DRAPES) ×2 IMPLANT
DRAPE U-SHAPE 47X51 STRL (DRAPES) ×2 IMPLANT
DRSG MEPILEX BORDER 4X8 (GAUZE/BANDAGES/DRESSINGS) ×2 IMPLANT
DRSG MEPILEX POST OP 4X8 (GAUZE/BANDAGES/DRESSINGS) ×1 IMPLANT
DURAPREP 26ML APPLICATOR (WOUND CARE) ×2 IMPLANT
ELECT REM PT RETURN 15FT ADLT (MISCELLANEOUS) ×2 IMPLANT
FACESHIELD WRAPAROUND (MASK) ×2 IMPLANT
FACESHIELD WRAPAROUND OR TEAM (MASK) ×1 IMPLANT
GLOVE BIO SURGEON STRL SZ 6.5 (GLOVE) ×2 IMPLANT
GLOVE BIO SURGEON STRL SZ7 (GLOVE) ×2 IMPLANT
GLOVE BIO SURGEON STRL SZ7.5 (GLOVE) ×2 IMPLANT
GLOVE BIOGEL PI IND STRL 7.0 (GLOVE) ×1 IMPLANT
GLOVE BIOGEL PI IND STRL 8 (GLOVE) ×1 IMPLANT
GLOVE BIOGEL PI INDICATOR 7.0 (GLOVE) ×1
GLOVE BIOGEL PI INDICATOR 8 (GLOVE) ×1
GOWN STRL REUS W/ TWL LRG LVL3 (GOWN DISPOSABLE) ×2 IMPLANT
GOWN STRL REUS W/ TWL XL LVL3 (GOWN DISPOSABLE) IMPLANT
GOWN STRL REUS W/TWL LRG LVL3 (GOWN DISPOSABLE) ×4
GOWN STRL REUS W/TWL XL LVL3 (GOWN DISPOSABLE) ×4
HANDPIECE INTERPULSE COAX TIP (DISPOSABLE)
HEAD FEM UNIPOLAR 44 OD STRL (Hips) ×1 IMPLANT
HOOD PEEL AWAY FLYTE STAYCOOL (MISCELLANEOUS) ×6 IMPLANT
KIT BASIN OR (CUSTOM PROCEDURE TRAY) ×2 IMPLANT
KIT TURNOVER KIT A (KITS) ×1 IMPLANT
PACK TOTAL JOINT (CUSTOM PROCEDURE TRAY) ×2 IMPLANT
PROTECTOR NERVE ULNAR (MISCELLANEOUS) ×2 IMPLANT
RETRIEVER SUT HEWSON (MISCELLANEOUS) ×2 IMPLANT
SET HNDPC FAN SPRY TIP SCT (DISPOSABLE) IMPLANT
SPACER DEPUY (Hips) ×1 IMPLANT
SPONGE T-LAP 4X18 ~~LOC~~+RFID (SPONGE) IMPLANT
STEM SUMMIT BASIC PRESSFIT SZ3 (Hips) IMPLANT
STRIP CLOSURE SKIN 1/4X4 (GAUZE/BANDAGES/DRESSINGS) ×3 IMPLANT
SUMMIT BASIC PRESSFIT SZ3 (Hips) ×2 IMPLANT
SUT FIBERWIRE #2 38 REV NDL BL (SUTURE) ×6
SUT VIC AB 0 CT1 36 (SUTURE) ×2 IMPLANT
SUT VIC AB 1 CT1 27 (SUTURE)
SUT VIC AB 1 CT1 27XBRD ANTBC (SUTURE) IMPLANT
SUT VIC AB 2-0 CT1 27 (SUTURE) ×4
SUT VIC AB 2-0 CT1 TAPERPNT 27 (SUTURE) ×2 IMPLANT
SUT VIC AB 3-0 SH 8-18 (SUTURE) ×2 IMPLANT
SUTURE FIBERWR#2 38 REV NDL BL (SUTURE) ×3 IMPLANT
TOWEL OR 17X26 10 PK STRL BLUE (TOWEL DISPOSABLE) ×6 IMPLANT
TOWER CARTRIDGE SMART MIX (DISPOSABLE) IMPLANT

## 2022-01-19 NOTE — Assessment & Plan Note (Signed)
we will replace and follow

## 2022-01-19 NOTE — Progress Notes (Signed)
   Called regarding echocardiogram results.  The echocardiogram demonstrates a reduced LVEF to 30 to 35%, this is compared to a prior echo which was at 45%.  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  Over the course of the day on diltiazem, her A-fib rate has come down significantly and is currently right around 100.  Blood pressure appears to be tolerating this well.  I was contacted by orthopedic surgery about whether or not she could still be considered a candidate for surgery.  I do not see any absolute contraindication to proceeding to surgery.  She does not appear grossly volume overloaded.  She will course need to be optimized with guideline directed medical therapy for heart failure and I would ultimately prefer rate control without calcium channel blocker.  Okay to proceed to the OR from a cardiology standpoint.  Pixie Casino, MD, [facility], [place] Director of the Advanced Lipid Disorders &  Cardiovascular Risk Reduction Clinic Diplomate of the American Board of Clinical Lipidology Attending Cardiologist  Direct Dial: [phone]  Fax: [phone]  Website:  [email or web address]

## 2022-01-19 NOTE — Progress Notes (Signed)
  Echocardiogram 2D Echocardiogram has been performed.  Fidel Levy 01/19/2022, 9:36 AM

## 2022-01-19 NOTE — Transfer of Care (Signed)
Immediate Anesthesia Transfer of Care Note  Patient: Rhonda Marshall  Procedure(s) Performed: ARTHROPLASTY BIPOLAR HIP (HEMIARTHROPLASTY) (Right: Hip)  Patient Location: PACU  Anesthesia Type:General  Level of Consciousness: awake, alert  and oriented  Airway & Oxygen Therapy: Patient Spontanous Breathing and Patient connected to face mask oxygen  Post-op Assessment: Report given to RN, Post -op Vital signs reviewed and stable and Patient moving all extremities X 4  Post vital signs: Reviewed and stable  Last Vitals:  Vitals Value Taken Time  BP 112/70 01/19/22 1845  Temp    Pulse 72 01/19/22 1848  Resp 14 01/19/22 1848  SpO2 100 % 01/19/22 1848  Vitals shown include unvalidated device data.  Last Pain:  Vitals:   01/19/22 1600  TempSrc: Oral  PainSc: 2          Complications: No notable events documented.

## 2022-01-19 NOTE — Progress Notes (Signed)
PROGRESS NOTE   Rhonda Marshall  ZOX:[id number] DOB: February 15, 1951 DOA: 01/18/2022 PCP: Timoteo Gaul, FNP  Brief Narrative:  71 year old white female Rheumatoid arthritis ?  Carotid bruit Permanent A-fib RVR on Eliquis CHADVASC >3 followed by Dr. 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  Patient tripped on a root in her garden around 01/14/2022-found to have right femoral neck fracture   Hospital-Problem based course  Follow-up with right hip fracture Pain seems somewhat uncontrolled without being medicated Postop weightbearing status, anticoagulation, wound care as per orthopedics A-fib RVR CHADVASC >3 Eliquis has been held from admission Rate is not controlled nursing has been instructed to increase and titrate upward on drip Continue metoprolol 50 every 6 hourly--Home meds include Toprol-XL 200 daily in addition to Cardizem 240 daily Compensated HFrEF-prior echo 45-50% 05/16/2019 No diuretics at this time Meds as above Rheumatoid arthritis not on medication Outpatient follow-up with primary physician Prior toxic nodular goiter Thyroid function studies done on admission look reasonable Okay to continue propylthiouracil 50 3 times daily  DVT prophylaxis: SCDs at this time Code Status: Full Family Communication: None present Disposition:  Status is: Inpatient Remains inpatient appropriate because: Needs surgery   Consultants:  Cardiology Orthopedics  Procedures:   Antimicrobials: Perioperative   Subjective: Quite hard of hearing-10/10 pain currently No chest pain No fever  Objective: Vitals:   01/19/22 0430 01/19/22 0615 01/19/22 0621 01/19/22 0630  BP: (!) 123/93     Pulse:  (!) 147 (!) 134 (!) 147  Resp: (!) 33 (!) 22 20 (!) 22  Temp:      TempSrc:      SpO2:  95% 92% 97%  Weight:      Height:        Intake/Output Summary (Last  24 hours) at 01/19/2022 0718 Last data filed at 01/19/2022 0000 Gross per 24 hour  Intake --  Output 150 ml  Net -150 ml   Filed Weights   01/18/22 2040  Weight: 50.3 kg    Examination:  EOMI NCAT looks about stated age No icterus no pallor Chest is clear no rales rhonchi S1-S2 tachycardic rapid A-fib on monitors Abdomen is soft no rebound no guarding ROM intact but limited to right hip  Data Reviewed: personally reviewed   CBC    Component Value Date/Time   WBC 9.2 01/18/2022 1421   RBC 3.92 01/18/2022 1421   HGB 12.4 01/18/2022 1421   HGB 12.8 01/03/2022 1520   HCT 36.5 01/18/2022 1421   HCT 37.5 01/03/2022 1520   PLT 208 01/18/2022 1421   PLT 335 01/03/2022 1520   MCV 93.1 01/18/2022 1421   MCV 93 01/03/2022 1520   MCH 31.6 01/18/2022 1421   MCHC 34.0 01/18/2022 1421   RDW 13.6 01/18/2022 1421   RDW 12.8 01/03/2022 1520   LYMPHSABS 0.8 01/18/2022 1421   MONOABS 0.7 01/18/2022 1421   EOSABS 0.0 01/18/2022 1421   BASOSABS 0.0 01/18/2022 1421      Latest Ref Rng & Units 01/18/2022   10:16 PM 01/18/2022    2:21 PM 05/03/2021    3:13 PM  CMP  Glucose 70 - 99 mg/dL 127  98    BUN 8 - 23 mg/dL 13  10    Creatinine 0.44 - 1.00 mg/dL 0.88  0.85    Sodium 135 - 145 mmol/L 135  133    Potassium 3.5 - 5.1  mmol/L 3.3  3.8    Chloride 98 - 111 mmol/L 99  95    CO2 22 - 32 mmol/L 24  24    Calcium 8.9 - 10.3 mg/dL 8.8  10.2    Total Protein 6.5 - 8.1 g/dL 7.2     Total Bilirubin 0.3 - 1.2 mg/dL 1.0     Alkaline Phos 38 - 126 U/L 101     AST 15 - 41 U/L 25     ALT 0 - 44 U/L 21   9      Radiology Studies: DG Chest 1 View  Result Date: 01/18/2022 CLINICAL DATA:  Fall, possible preop EXAM: CHEST  1 VIEW COMPARISON:  Radiograph 07/26/2014 FINDINGS: Mildly enlarged cardiac silhouette. No focal airspace disease. No pleural effusion. No pneumothorax. No acute osseous abnormality. IMPRESSION: Cardiomegaly.  No focal airspace consolidation. No acute osseous abnormality on  single frontal view of the chest. Electronically Signed   By: Maurine Simmering M.D.   On: 01/18/2022 14:28   DG Femur Min 2 Views Right  Result Date: 01/18/2022 CLINICAL DATA:  Recent fall. EXAM: RIGHT FEMUR 2 VIEWS COMPARISON:  None Available. FINDINGS: Acute impacted subcapital right femoral neck fracture. No dislocation. Degenerative changes of the right knee. The right hip joint space is relatively preserved. Soft tissues are unremarkable. IMPRESSION: 1. Acute impacted subcapital right femoral neck fracture. Electronically Signed   By: Titus Dubin M.D.   On: 01/18/2022 14:26   DG Lumbar Spine Complete  Result Date: 01/18/2022 CLINICAL DATA:  Recent fall. EXAM: LUMBAR SPINE - COMPLETE 4+ VIEW COMPARISON:  Lumbar spine x-rays dated March 31, 2009. FINDINGS: There is no evidence of lumbar spine fracture. Severe asymmetric disc height loss at L1-L2 and L5-S1. Moderate disc height loss at L4-L5. Severe left facet arthropathy at L5-S1. Findings have progressed since 2010. Incompletely visualized right femoral neck fracture. IMPRESSION: 1. No acute osseous abnormality of the lumbar spine. 2. Incompletely visualized right femoral neck fracture. Electronically Signed   By: Titus Dubin M.D.   On: 01/18/2022 14:25   DG Knee Complete 4 Views Right  Result Date: 01/18/2022 CLINICAL DATA:  Recent fall. EXAM: RIGHT KNEE - COMPLETE 4+ VIEW COMPARISON:  None Available. FINDINGS: No acute fracture or dislocation. No significant joint effusion. Tricompartmental degenerative changes with severe lateral compartment joint space narrowing. Osteopenia. Soft tissues are unremarkable. IMPRESSION: 1. No acute osseous abnormality. 2. Tricompartmental degenerative changes, severe in the lateral compartment. Electronically Signed   By: Titus Dubin M.D.   On: 01/18/2022 14:22   DG Hip Unilat With Pelvis 2-3 Views Right  Result Date: 01/18/2022 CLINICAL DATA:  Pain.  Status post fall. EXAM: DG HIP (WITH OR WITHOUT  PELVIS) 2-3V RIGHT COMPARISON:  None Available. FINDINGS: There is an acute, impacted, subcapital right femoral neck fracture. No dislocation. No additional fractures identified. Lumbar scoliosis and degenerative disc disease noted. IMPRESSION: Acute, impacted, subcapital right femoral neck fracture. Electronically Signed   By: Kerby Moors M.D.   On: 01/18/2022 14:16     Scheduled Meds:  Chlorhexidine Gluconate Cloth  6 each Topical Daily   metoprolol tartrate  50 mg Oral Q6H   propylthiouracil  50 mg Oral TID   Continuous Infusions:  methocarbamol (ROBAXIN) IV     sodium chloride       LOS: 1 day   Time spent: [place], MD [facility] To contact the attending provider between 7A-7P or the covering provider during after hours 7P-7A, please log  into the web site [email or web address] and access using universal [place] password for that web site. If you do not have the password, please call the hospital operator.  01/19/2022, 7:18 AM

## 2022-01-19 NOTE — Anesthesia Postprocedure Evaluation (Signed)
Anesthesia Post Note  Patient: Rhonda Marshall  Procedure(s) Performed: ARTHROPLASTY BIPOLAR HIP (HEMIARTHROPLASTY) (Right: Hip)     Patient location during evaluation: PACU Anesthesia Type: General Level of consciousness: awake and alert and oriented Pain management: pain level controlled Vital Signs Assessment: post-procedure vital signs reviewed and stable Respiratory status: spontaneous breathing, nonlabored ventilation and respiratory function stable Cardiovascular status: blood pressure returned to baseline and stable Postop Assessment: no apparent nausea or vomiting Anesthetic complications: no   No notable events documented.  Last Vitals:  Vitals:   01/19/22 1915 01/19/22 1919  BP: 116/81   Pulse: 98 96  Resp: 14 17  Temp:    SpO2: 100% 99%    Last Pain:  Vitals:   01/19/22 1919  TempSrc:   PainSc: 10-Worst pain ever                 Quinnetta Roepke A.

## 2022-01-19 NOTE — Assessment & Plan Note (Signed)
Mildly in the setting of tachycardia most likely demand we will continue to cycle  Echo in a.m. EKG showing left bundle branch block which is old

## 2022-01-19 NOTE — Op Note (Signed)
01/19/2022  6:45 PM  PATIENT:  Rhonda Marshall   MRN: [id number]  PRE-OPERATIVE DIAGNOSIS: Right femoral neck fracture  POST-OPERATIVE DIAGNOSIS: Same  PROCEDURE:  Procedure(s): Right hip hemiarthroplasty  PREOPERATIVE INDICATIONS:  Rhonda Marshall is an 71 y.o. female who was admitted 01/18/2022 with a diagnosis of Hip fracture ([place]) and elected for surgical management.  The risks benefits and alternatives were discussed with the patient including but not limited to the risks of nonoperative treatment, versus surgical intervention including infection, bleeding, nerve injury, periprosthetic fracture, the need for revision surgery, dislocation, leg length discrepancy, blood clots, cardiopulmonary complications, morbidity, mortality, among others, and they were willing to proceed.  Predicted outcome is good, although there will be at least a six to nine month expected recovery.   OPERATIVE REPORT     SURGEON:  Marchia Bond, MD    ASSISTANT:  Merlene Pulling, PA-C,   (Present throughout the entire procedure,  necessary for completion of procedure in a timely manner, assisting with retraction, instrumentation, and closure)     ANESTHESIA: General  ESTIMATED BLOOD LOSS: 606 mL    COMPLICATIONS:  None.   UNIQUE ASPECTS OF THE CASE: Her anatomy was fairly small, I had excellent access and a smooth reduction.     COMPONENTS:  Chemical engineer Femoral Fracture stem size 3, with a -1 spacer and a size 44 fracture head unipolar hip ball.    PROCEDURE IN DETAIL: The patient was met in the holding area and identified.  The appropriate hip  was marked at the operative site. The patient was then transported to the OR and  placed under anesthesia.  At that point, the patient was  placed in the lateral decubitus position with the operative side up and  secured to the operating room table and all bony prominences padded.     The operative lower extremity was prepped from the iliac crest to  the toes.  Sterile draping was performed.  Time out was performed prior to incision.      A routine posterolateral approach was utilized via sharp dissection  carried down to the subcutaneous tissue.  Gross bleeders were Bovie  coagulated.  The iliotibial band was identified and incised  along the length of the skin incision.  Self-retaining retractors were  inserted.  With the hip internally rotated, the short external rotators  were identified. The piriformis was tagged with FiberWire, and the hip capsule released in a T-type fashion.  The femoral neck was exposed, and I resected the femoral neck using the appropriate jig. This was performed at approximately a thumb's breadth above the lesser trochanter.    I then exposed the deep acetabulum, cleared out any tissue including the ligamentum teres, and included the hip capsule in the FiberWire used above and below the T.    I then prepared the proximal femur using the cookie-cutter, the lateralizing reamer, and then sequentially broached.  A trial utilized, and I reduced the hip and it was found to have excellent stability with functional range of motion. The trial components were then removed.   I then place the real implant and I impacted the real head ball into place. The hip was then reduced and taken through functional range of motion and found to have excellent stability. Leg lengths were restored.  I then used a 2 mm drill bits to pass the FiberWire suture from the capsule and piriformis through the greater trochanter, and secured this. Excellent posterior capsular repair was achieved. I  also closed the T in the capsule.  I then irrigated the hip copiously again with pulse lavage, and repaired the fascia with Vicryl, followed by Vicryl for the subcutaneous tissue, Monocryl for the skin, Steri-Strips and sterile gauze. The wounds were injected. The patient was then awakened and returned to PACU in stable and satisfactory condition. There were  no complications.  Marchia Bond, MD Orthopedic Surgeon [phone]   01/19/2022 6:45 PM

## 2022-01-19 NOTE — Anesthesia Preprocedure Evaluation (Addendum)
Anesthesia Evaluation  Patient identified by MRN, date of birth, ID band Patient awake    Reviewed: Allergy & Precautions, NPO status , Patient's Chart, lab work & pertinent test results, reviewed documented beta blocker date and time   History of Anesthesia Complications (+) Family history of anesthesia reaction  Airway Mallampati: II  TM Distance: >3 FB Neck ROM: Full    Dental  (+) Dental Advisory Given, Missing, Chipped,    Pulmonary neg pulmonary ROS, Patient abstained from smoking., former smoker,    Pulmonary exam normal breath sounds clear to auscultation       Cardiovascular hypertension, Pt. on home beta blockers and Pt. on medications +CHF  Normal cardiovascular exam+ dysrhythmias Atrial Fibrillation  Rhythm:Regular Rate:Normal  Echo 05/16/2019 1. Left ventricular ejection fraction, by visual estimation, is 45 to 50%. The left ventricle has normal function. There is mildly increased left ventricular hypertrophy.  2. Mild global hypokinesis  3. Left ventricular diastolic function could not be evaluated.  4. Global right ventricle has normal systolic function.The right ventricular size is normal. No increase in right ventricular wall thickness.  5. Left atrial size was moderately dilated.  6. Right atrial size was normal.  7. The mitral valve is abnormal. Trace mitral valve regurgitation.  8. The tricuspid valve is grossly normal. Tricuspid valve regurgitation is mild.  9. The aortic valve is tricuspid. Aortic valve regurgitation is not visualized. Mild aortic valve sclerosis without stenosis.  10. The pulmonic valve was grossly normal. Pulmonic valve regurgitation is not visualized.  11. Normal pulmonary artery systolic pressure.  12. The inferior vena cava is normal in size with greater than 50% respiratory variability, suggesting right atrial pressure of 3 mmHg.  13. The interatrial septum was not well  visualized  EKG 01/18/22 Atrial fibrillation rate 135, LBBB pattern - faster rate than previous EKG but unchanged   Neuro/Psych PSYCHIATRIC DISORDERS Anxiety negative neurological ROS     GI/Hepatic Neg liver ROS, Ca of glottis   Endo/Other  Hyperthyroidism   Renal/GU negative Renal ROS  negative genitourinary   Musculoskeletal  (+) Arthritis , Osteoarthritis and Rheumatoid disorders,  Right femoral neck Fx   Abdominal   Peds  Hematology  (+) Blood dyscrasia, , Eliquis therapy- last dose 01/17/22   Anesthesia Other Findings   Reproductive/Obstetrics                            Anesthesia Physical  Anesthesia Plan  ASA: 3 and emergent  Anesthesia Plan: General   Post-op Pain Management: Ofirmev IV (intra-op)*, Dilaudid IV and Precedex   Induction: Intravenous  PONV Risk Score and Plan: 2 and Midazolam, Dexamethasone and Ondansetron  Airway Management Planned: Oral ETT  Additional Equipment: None  Intra-op Plan:   Post-operative Plan: Extubation in OR  Informed Consent: I have reviewed the patients History and Physical, chart, labs and discussed the procedure including the risks, benefits and alternatives for the proposed anesthesia with the patient or authorized representative who has indicated his/her understanding and acceptance.     Dental advisory given  Plan Discussed with: CRNA and Anesthesiologist  Anesthesia Plan Comments:         Anesthesia Quick Evaluation

## 2022-01-19 NOTE — Consult Note (Addendum)
ORTHOPAEDIC CONSULTATION   Chief Complaint: Right hip pain  HPI: Rhonda Marshall is a 70 y.o. female with history of hypothyroidism, rheumatoid arthritis, A-fib on Eliquis, bilateral carotid bruit, systolic CHF EF 60%, history of throat cancer who presented to the [facility] emergency department yesterday with right hip pain.  She [place] the beach with [place] on [place] 01/15/22 when she fell directly onto her right hip.  She had severe right hip and knee pain and struggle to walk.  She returned home from the beach and continued to have pain with difficulty walking she was brought to the emergency department on 01/18/2022 where x-rays were taken and she was found to have a right hip fracture.  Today she states pain is located at the right hip, pain is moderate-severe, worse with any movement and radiates down her entire leg.  Pain is better with rest and pain medication.  She is on chronic narcotic pain medication at baseline, she takes Percocet 10/325 4 times a day.  She does not use an assistive device at baseline. She has severe known severe right knee osteoarthritis and recently received a cortisone shot few weeks ago.  She is on Eliquis which is currently on hold in anticipation of surgery.  Past Medical History:  Diagnosis Date   Anxiety    Atrial fibrillation ([place])    newly diagnosed 10/2010   Family history of adverse reaction to anesthesia    Sister's BP drops    Hypertension    Rheumatoid arthritis(714.0)    Subclinical hyperthyroidism    Past Surgical History:  Procedure Laterality Date   Arm surgery     fracture, right arm   CARDIOVERSION N/A 08/10/2014   Procedure: CARDIOVERSION;  Surgeon: Dorothy Spark, MD;  Location: [place];  Service: Cardiovascular;  Laterality: N/A;   CATARACT EXTRACTION     DIRECT LARYNGOSCOPY N/A 09/02/2019   Procedure: DIRECT LARYNGOSCOPY WITH BIOPSY;  Surgeon: Rozetta Nunnery, MD;  Location: [facility] [place];  Service:  ENT;  Laterality: N/A;   IR GASTROSTOMY TUBE MOD SED  10/17/2019   IR GASTROSTOMY TUBE REMOVAL  01/03/2020   TUBAL LIGATION     Social History   Socioeconomic History   Marital status: Widowed    Spouse name: Not on file   Number of children: 1   Years of education: Not on file   Highest education level: Not on file  Occupational History   Occupation: laborer    Comment: [employer]  Tobacco Use   Smoking status: Former    Packs/day: 0.25    Years: 54.00    Total pack years: 13.50    Types: Cigarettes    Start date: 69    Quit date: 10/2019    Years since quitting: 2.2   Smokeless tobacco: Never  Vaping Use   Vaping Use: Never used  Substance and Sexual Activity   Alcohol use: Not Currently    Alcohol/week: 0.0 standard drinks of alcohol   Drug use: No   Sexual activity: Not on file  Other Topics Concern   Not on file  Social History Narrative   Not on file   Social Determinants of Health   Financial Resource Strain: Not on file  Food Insecurity: Not on file  Transportation Needs: Not on file  Physical Activity: Not on file  Stress: Not on file  Social Connections: Not on file   Family History  Problem Relation Age of Onset   Heart attack Mother  9       pacemaker age 76   Hypothyroidism Mother    Cancer Father        colon and prostate   Diabetes Brother    CAD Brother 6   Cancer Brother        Renal   Hypothyroidism Sister    Allergies  Allergen Reactions   Clindamycin/Lincomycin Rash   Latex Rash    Latex gloves   Prior to Admission medications   Medication Sig Start Date End Date Taking? Authorizing Provider  ALPRAZolam Duanne Moron) 1 MG tablet Take 1 mg by mouth 3 (three) times daily as needed for anxiety.  05/11/14  Yes [provider]  apixaban (ELIQUIS) 5 MG TABS tablet Take 1 tablet (5 mg total) by mouth 2 (two) times daily. NEEDS LABS, PLEASE COME TO OFFICE ASAP 10/23/21  Yes Minus Breeding, MD  diltiazem (CARDIZEM CD) 240 MG  24 hr capsule Take 240 mg by mouth daily.   Yes [provider]  Multiple Vitamin (MULTIVITAMIN WITH MINERALS) TABS tablet Take 1 tablet by mouth daily.    Yes [provider]  naloxone (NARCAN) nasal spray 4 mg/0.1 mL 1 spray as needed (accidental overdose). 12/10/21  Yes [provider]  oxyCODONE-acetaminophen (PERCOCET) 10-325 MG tablet Take 1 tablet by mouth 4 (four) times daily as needed for pain.   Yes [provider]  propylthiouracil (PTU) 50 MG tablet Take 1 tablet (50 mg total) by mouth 3 (three) times daily. 11/26/21  Yes Elayne Snare, MD  Vitamin D, Ergocalciferol, (DRISDOL) 1.25 MG (50000 UNIT) CAPS capsule Take 50,000 Units by mouth once a week. 12/28/21  Yes [provider]  metoprolol (TOPROL-XL) 200 MG 24 hr tablet Take 1 tablet (200 mg total) by mouth daily. Patient not taking: Reported on 01/18/2022 05/01/21   Minus Breeding, MD   DG Chest 1 View  Result Date: 01/18/2022 CLINICAL DATA:  Fall, possible preop EXAM: CHEST  1 VIEW COMPARISON:  Radiograph 07/26/2014 FINDINGS: Mildly enlarged cardiac silhouette. No focal airspace disease. No pleural effusion. No pneumothorax. No acute osseous abnormality. IMPRESSION: Cardiomegaly.  No focal airspace consolidation. No acute osseous abnormality on single frontal view of the chest. Electronically Signed   By: Maurine Simmering M.D.   On: 01/18/2022 14:28   DG Femur Min 2 Views Right  Result Date: 01/18/2022 CLINICAL DATA:  Recent fall. EXAM: RIGHT FEMUR 2 VIEWS COMPARISON:  None Available. FINDINGS: Acute impacted subcapital right femoral neck fracture. No dislocation. Degenerative changes of the right knee. The right hip joint space is relatively preserved. Soft tissues are unremarkable. IMPRESSION: 1. Acute impacted subcapital right femoral neck fracture. Electronically Signed   By: Titus Dubin M.D.   On: 01/18/2022 14:26   DG Lumbar Spine Complete  Result Date: 01/18/2022 CLINICAL DATA:  Recent  fall. EXAM: LUMBAR SPINE - COMPLETE 4+ VIEW COMPARISON:  Lumbar spine x-rays dated March 31, 2009. FINDINGS: There is no evidence of lumbar spine fracture. Severe asymmetric disc height loss at L1-L2 and L5-S1. Moderate disc height loss at L4-L5. Severe left facet arthropathy at L5-S1. Findings have progressed since 2010. Incompletely visualized right femoral neck fracture. IMPRESSION: 1. No acute osseous abnormality of the lumbar spine. 2. Incompletely visualized right femoral neck fracture. Electronically Signed   By: Titus Dubin M.D.   On: 01/18/2022 14:25   DG Knee Complete 4 Views Right  Result Date: 01/18/2022 CLINICAL DATA:  Recent fall. EXAM: RIGHT KNEE - COMPLETE 4+ VIEW COMPARISON:  None Available.  FINDINGS: No acute fracture or dislocation. No significant joint effusion. Tricompartmental degenerative changes with severe lateral compartment joint space narrowing. Osteopenia. Soft tissues are unremarkable. IMPRESSION: 1. No acute osseous abnormality. 2. Tricompartmental degenerative changes, severe in the lateral compartment. Electronically Signed   By: Titus Dubin M.D.   On: 01/18/2022 14:22   DG Hip Unilat With Pelvis 2-3 Views Right  Result Date: 01/18/2022 CLINICAL DATA:  Pain.  Status post fall. EXAM: DG HIP (WITH OR WITHOUT PELVIS) 2-3V RIGHT COMPARISON:  None Available. FINDINGS: There is an acute, impacted, subcapital right femoral neck fracture. No dislocation. No additional fractures identified. Lumbar scoliosis and degenerative disc disease noted. IMPRESSION: Acute, impacted, subcapital right femoral neck fracture. Electronically Signed   By: Kerby Moors M.D.   On: 01/18/2022 14:16   Family History Reviewed and non-contributory, no pertinent history of problems with bleeding or anesthesia      Review of Systems 14 system ROS conducted and negative except for that noted in HPI   OBJECTIVE  Vitals:Patient Vitals for the past 8 hrs:  BP Temp Temp src Pulse Resp SpO2   01/19/22 0900 114/75 -- -- (!) 123 (!) 29 96 %  01/19/22 0800 125/87 -- -- (!) 143 15 98 %  01/19/22 0755 -- 98.1 F (36.7 C) Oral -- -- --  01/19/22 0700 127/87 -- -- (!) 138 20 95 %  01/19/22 0630 -- -- -- (!) 147 (!) 22 97 %  01/19/22 0621 -- -- -- (!) 134 20 92 %  01/19/22 0615 -- -- -- (!) 147 (!) 22 95 %  01/19/22 0430 (!) 123/93 -- -- -- (!) 33 --  01/19/22 0415 -- -- -- -- (!) 24 --  01/19/22 0400 113/90 -- -- -- (!) 26 98 %  01/19/22 0329 -- 98.2 F (36.8 C) Oral -- -- --   General: Alert, no acute distress Cardiovascular: No pedal edema.  Warm extremities noted Respiratory: No cyanosis, no use of accessory musculature GI: No organomegaly, abdomen is soft and non-tender Skin: No lesions in the area of chief complaint other than those listed below in MSK exam.  Neurologic: Sensation intact distally. Psychiatric: Patient is competent for consent with normal mood and affect Lymphatic: No swelling obvious and reported other than the area involved in the exam below  Extremities  RLE: Small area of ecchymosis to the lateral right hip.  She has expected tenderness palpation over the right hip.  Dorsiflexion and plantarflexion intact.  2+ DP pulse.  Patient endorses dorsal sensation to all aspects of the right foot.  Mild tenderness palpation of the right knee.  Small skin tear the lateral joint line.  No effusion.    Test Results Imaging 4 views right knee show severe right knee osteoarthritis worse in the lateral compartment. X-rays of right hip show acute displaced right femoral neck fracture Labs cbc Recent Labs    01/18/22 1421  WBC 9.2  HGB 12.4  HCT 36.5  PLT 208    Recent Labs    01/18/22 1421 01/18/22 2216  NA 133* 135  K 3.8 3.3*  CL 95* 99  CO2 24 24  GLUCOSE 98 127*  BUN 10 13  CREATININE 0.85 0.88  CALCIUM 10.2 8.8*     ASSESSMENT AND PLAN: Right femoral neck fracture  Discussed the nature of the injury as well as the care with the patient as  well as the family(sister Enid Derry and son Merry Proud).  Discussed options and non-operative versus operative measures. Nonoperative measures  are not well tolerated as patient's on bedrest for extended periods of time tend to develop secondary issues such as pneumonia, urinary tract infections, bedsores and delirium.  Based on this our recommendation is for operative measures. Understanding this the patient/family elected to proceed with operative measures.  The risks and benefits of  surgical intervention including infection, bleeding, nerve injury, periprosthetic fracture, the need for revision surgery, leg length discrepancy, gait change, blood clots, cardiopulmonary complications, morbidity, mortality, among others, and they were willing to proceed.    - Plan: Current tentative plan is for operative fixation with right hip hemiarthroplasty this afternoon.  Appreciate medicine and cardiology, patient recently underwent echocardiogram this morning. Will await further cardiology recommendations prior to proceeding with surgery. -Continue n.p.o. status today anticipation of surgery.  - Weight Bearing Status/Activity: will ammend WB status postop, bedrest for now - PT/OT post op - VTE Prophylaxis: SCDs for now - Pain control: PRN pain medications   01/19/2022 10:05 AM

## 2022-01-19 NOTE — Progress Notes (Signed)
SLP Cancellation Note  Patient Details Name: Rhonda Marshall MRN: [id number] DOB: 10-23-1950   Cancelled treatment:       Reason Eval/Treat Not Completed: Other (comment) (Pt's case discussed with RN who advised that pt is NPO for surgery today and that it is tentatively scheduled for ~1600. SLP will therefore plan for follow up on subsequent date, but RN agreed to secure chat this SLP if plans change.)  Rhonda Marshall, [place], [place] Office number [phone] Pager [place] 01/19/2022, 9:06 AM

## 2022-01-19 NOTE — Consult Note (Signed)
CONSULTATION NOTE   Patient Name: Rhonda Marshall Date of Encounter: 01/19/2022 Cardiologist: Minus Breeding, MD Electrophysiologist: None Advanced Heart Failure: None   Chief Complaint   Hip pain  Patient Profile   71 yo female with longstanding persistent afib, LBBB, presented with femur fracture, asked to see for afib and preop  HPI   Rhonda Marshall is a 71 y.o. female who is being seen today for the evaluation of afib, preop at the request of Dr. Verlon Au. This is a 71 yo female with history of longstanding persistent if not permanent afib for the past 7 years, LBBB, hypothyroidism and presumed nonischemic cardiomyopathy with LVEF 45 to 50% in 2020.  She presented with femur fracture after a fall [place]. Plan for probable OR today - recommendations overnight by the cardiology fellow included uptitrating AV nodal blocking medications for better rate control.  She was anticoagulated on Eliquis, however this is currently being held.  Apparently she became hypotensive on diltiazem and it was discontinued overnight however blood pressures are improved today.  Heart rate is poorly controlled in the 140s.  PMHx   Past Medical History:  Diagnosis Date   Anxiety    Atrial fibrillation ([place])    newly diagnosed 10/2010   Family history of adverse reaction to anesthesia    Sister's BP drops    Hypertension    Rheumatoid arthritis(714.0)    Subclinical hyperthyroidism     Past Surgical History:  Procedure Laterality Date   Arm surgery     fracture, right arm   CARDIOVERSION N/A 08/10/2014   Procedure: CARDIOVERSION;  Surgeon: Dorothy Spark, MD;  Location: [facility];  Service: Cardiovascular;  Laterality: N/A;   CATARACT EXTRACTION     DIRECT LARYNGOSCOPY N/A 09/02/2019   Procedure: DIRECT LARYNGOSCOPY WITH BIOPSY;  Surgeon: Rozetta Nunnery, MD;  Location: [facility];  Service: ENT;  Laterality: N/A;   IR GASTROSTOMY  TUBE MOD SED  10/17/2019   IR GASTROSTOMY TUBE REMOVAL  01/03/2020   TUBAL LIGATION      FAMHx   Family History  Problem Relation Age of Onset   Heart attack Mother 39       pacemaker age 11   Hypothyroidism Mother    Cancer Father        colon and prostate   Diabetes Brother    CAD Brother 54   Cancer Brother        Renal   Hypothyroidism Sister     SOCHx    reports that she quit smoking about 2 years ago. Her smoking use included cigarettes. She started smoking about 56 years ago. She has a 13.50 pack-year smoking history. She has never used smokeless tobacco. She reports that she does not currently use alcohol. She reports that she does not use drugs.  Outpatient Medications   No current facility-administered medications on file prior to encounter.   Current Outpatient Medications on File Prior to Encounter  Medication Sig Dispense Refill   ALPRAZolam (XANAX) 1 MG tablet Take 1 mg by mouth 3 (three) times daily as needed for anxiety.   0   apixaban (ELIQUIS) 5 MG TABS tablet Take 1 tablet (5 mg total) by mouth 2 (two) times daily. NEEDS LABS, PLEASE COME TO OFFICE ASAP 30 tablet 0   diltiazem (CARDIZEM CD) 240 MG 24 hr capsule Take 240 mg by mouth daily.     Multiple Vitamin (MULTIVITAMIN WITH MINERALS) TABS tablet Take 1  tablet by mouth daily.      naloxone (NARCAN) nasal spray 4 mg/0.1 mL 1 spray as needed (accidental overdose).     oxyCODONE-acetaminophen (PERCOCET) 10-325 MG tablet Take 1 tablet by mouth 4 (four) times daily as needed for pain.     propylthiouracil (PTU) 50 MG tablet Take 1 tablet (50 mg total) by mouth 3 (three) times daily. 270 tablet 1   Vitamin D, Ergocalciferol, (DRISDOL) 1.25 MG (50000 UNIT) CAPS capsule Take 50,000 Units by mouth once a week.     metoprolol (TOPROL-XL) 200 MG 24 hr tablet Take 1 tablet (200 mg total) by mouth daily. (Patient not taking: Reported on 01/18/2022) 90 tablet 3    Inpatient Medications    Scheduled Meds:  Chlorhexidine  Gluconate Cloth  6 each Topical Daily   metoprolol tartrate  50 mg Oral Q6H   propylthiouracil  50 mg Oral TID    Continuous Infusions:  methocarbamol (ROBAXIN) IV     sodium chloride      PRN Meds: fentaNYL (SUBLIMAZE) injection, HYDROcodone-acetaminophen, methocarbamol **OR** methocarbamol (ROBAXIN) IV, mouth rinse   ALLERGIES   Allergies  Allergen Reactions   Clindamycin/Lincomycin Rash   Latex Rash    Latex gloves    ROS   Pertinent items noted in HPI and remainder of comprehensive ROS otherwise negative.  Vitals   Vitals:   01/19/22 0621 01/19/22 0630 01/19/22 0700 01/19/22 0755  BP:   127/87   Pulse: (!) 134 (!) 147 (!) 138   Resp: 20 (!) 22 20   Temp:    98.1 F (36.7 C)  TempSrc:    Oral  SpO2: 92% 97% 95%   Weight:      Height:        Intake/Output Summary (Last 24 hours) at 01/19/2022 2878 Last data filed at 01/19/2022 0700 Gross per 24 hour  Intake 252.09 ml  Output 150 ml  Net 102.09 ml   Filed Weights   01/18/22 2040  Weight: 50.3 kg    Physical Exam   General appearance: alert and mild distress Neck: no carotid bruit, no JVD, and thyroid not enlarged, symmetric, no tenderness/mass/nodules Lungs: clear to auscultation bilaterally Heart: irregularly irregular rhythm and tachycardic Abdomen: soft, non-tender; bowel sounds normal; no masses,  no organomegaly Extremities: extremities normal, atraumatic, no cyanosis or edema and right lower extremity is externally rotated Pulses: 2+ and symmetric Skin: Skin color, texture, turgor normal. No rashes or lesions Neurologic: Grossly normal Psych: Appears in pain  Labs   Results for orders placed or performed during the hospital encounter of 01/18/22 (from the past 48 hour(s))  CBC with Differential     Status: None   Collection Time: 01/18/22  2:21 PM  Result Value Ref Range   WBC 9.2 4.0 - 10.5 K/uL   RBC 3.92 3.87 - 5.11 MIL/uL   Hemoglobin 12.4 12.0 - 15.0 g/dL   HCT 36.5 36.0 - 46.0 %    MCV 93.1 80.0 - 100.0 fL   MCH 31.6 26.0 - 34.0 pg   MCHC 34.0 30.0 - 36.0 g/dL   RDW 13.6 11.5 - 15.5 %   Platelets 208 150 - 400 K/uL   nRBC 0.0 0.0 - 0.2 %   Neutrophils Relative % 83 %   Neutro Abs 7.5 1.7 - 7.7 K/uL   Lymphocytes Relative 9 %   Lymphs Abs 0.8 0.7 - 4.0 K/uL   Monocytes Relative 8 %   Monocytes Absolute 0.7 0.1 - 1.0 K/uL   Eosinophils  Relative 0 %   Eosinophils Absolute 0.0 0.0 - 0.5 K/uL   Basophils Relative 0 %   Basophils Absolute 0.0 0.0 - 0.1 K/uL   Immature Granulocytes 0 %   Abs Immature Granulocytes 0.04 0.00 - 0.07 K/uL    Comment: Performed at [facility], [address], [place], [place] [address]  Basic metabolic panel     Status: Abnormal   Collection Time: 01/18/22  2:21 PM  Result Value Ref Range   Sodium 133 (L) 135 - 145 mmol/L   Potassium 3.8 3.5 - 5.1 mmol/L   Chloride 95 (L) 98 - 111 mmol/L   CO2 24 22 - 32 mmol/L   Glucose, Bld 98 70 - 99 mg/dL    Comment: Glucose reference range applies only to samples taken after fasting for at least 8 hours.   BUN 10 8 - 23 mg/dL   Creatinine, Ser 0.85 0.44 - 1.00 mg/dL   Calcium 10.2 8.9 - 10.3 mg/dL   GFR, Estimated >60 >60 mL/min    Comment: (NOTE) Calculated using the CKD-EPI Creatinine Equation (2021)    Anion gap 14 5 - 15    Comment: Performed at [facility], [address] [place], [place] [address]  MRSA Next Gen by PCR, Nasal     Status: None   Collection Time: 01/18/22  9:20 PM   Specimen: Nasal Mucosa; Nasal Swab  Result Value Ref Range   MRSA by PCR Next Gen NOT DETECTED NOT DETECTED    Comment: (NOTE) The GeneXpert MRSA Assay (FDA approved for NASAL specimens only), is one component of a comprehensive MRSA colonization surveillance program. It is not intended to diagnose MRSA infection nor to guide or monitor treatment for MRSA infections. Test performance is not FDA approved in patients less than 21 years old. Performed at [facility], [place] [address] [place], [place] [address]   Comprehensive metabolic panel     Status: Abnormal   Collection Time: 01/18/22 10:16 PM  Result Value Ref Range   Sodium 135 135 - 145 mmol/L   Potassium 3.3 (L) 3.5 - 5.1 mmol/L   Chloride 99 98 - 111 mmol/L   CO2 24 22 - 32 mmol/L   Glucose, Bld 127 (H) 70 - 99 mg/dL    Comment: Glucose reference range applies only to samples taken after fasting for at least 8 hours.   BUN 13 8 - 23 mg/dL   Creatinine, Ser 0.88 0.44 - 1.00 mg/dL   Calcium 8.8 (L) 8.9 - 10.3 mg/dL   Total Protein 7.2 6.5 - 8.1 g/dL   Albumin 3.3 (L) 3.5 - 5.0 g/dL   AST 25 15 - 41 U/L   ALT 21 0 - 44 U/L   Alkaline Phosphatase 101 38 - 126 U/L   Total Bilirubin 1.0 0.3 - 1.2 mg/dL   GFR, Estimated >60 >60 mL/min    Comment: (NOTE) Calculated using the CKD-EPI Creatinine Equation (2021)    Anion gap 12 5 - 15    Comment: Performed at [facility], [place] [address] [place], [place] [address]  Magnesium     Status: None   Collection Time: 01/18/22 10:16 PM  Result Value Ref Range   Magnesium 2.3 1.7 - 2.4 mg/dL    Comment: Performed at [facility], [place] [address], [place],  [address]  Phosphorus     Status: None   Collection Time: 01/18/22 10:16 PM  Result Value Ref Range  Phosphorus 2.5 2.5 - 4.6 mg/dL    Comment: Performed at [facility], [place] [address], [place], [place] [address]  Troponin I (High Sensitivity)     Status: Abnormal   Collection Time: 01/18/22 10:16 PM  Result Value Ref Range   Troponin I (High Sensitivity) 22 (H) <18 ng/L    Comment: (NOTE) Elevated high sensitivity troponin I (hsTnI) values and significant  changes across serial measurements may suggest ACS but many other  chronic and acute conditions are known to elevate hsTnI results.  Refer to the "Links" section for chest pain algorithms and additional  guidance. Performed at [facility], [place] [address], [place], [place] [address]   TSH     Status: None   Collection Time: 01/18/22 10:16 PM  Result Value Ref Range   TSH 1.746 0.350 - 4.500 uIU/mL    Comment: Performed by a 3rd Generation assay with a functional sensitivity of <=0.01 uIU/mL. Performed at [facility], [place] [address] [place], [place] [address]   T4, free     Status: None   Collection Time: 01/18/22 10:16 PM  Result Value Ref Range   Free T4 1.02 0.61 - 1.12 ng/dL    Comment: (NOTE) Biotin ingestion may interfere with free T4 tests. If the results are inconsistent with the TSH level, previous test results, or the clinical presentation, then consider biotin interference. If needed, order repeat testing after stopping biotin. Performed at [facility], [place] [address], [place], [place] [address]   CK     Status: None   Collection Time: 01/18/22 10:16 PM  Result Value Ref Range   Total CK 81 38 - 234 U/L    Comment: Performed at [facility], [place] [address], [place], [place] [address]  VITAMIN D 25 Hydroxy (Vit-D Deficiency, Fractures)     Status: None   Collection Time: 01/18/22 10:21 PM  Result Value Ref Range   Vit D, 25-Hydroxy 33.96 30 - 100 ng/mL    Comment: (NOTE) Vitamin D deficiency has been defined by the [place] practice guideline as a level of serum 25-OH  vitamin D less than 20 ng/mL (1,2). The Endocrine Society went on to  further define vitamin D insufficiency as a level between 21 and 29  ng/mL (2).  1. IOM (Institute of Medicine). 2010. Dietary reference intakes for  calcium and D. [place]: The [employer]. 2. Holick MF, Binkley [place], Bischoff-Ferrari HA, et al. Evaluation,  treatment, and prevention of vitamin D deficiency: an Endocrine  Society clinical practice guideline, JCEM. 2011 Jul; 96(7): 1911-30.  Performed at [facility], [place] [address],  [place], [place] [address]   Type and screen     Status: None   Collection Time: 01/19/22 12:46 AM  Result Value Ref Range   ABO/RH(D) O POS    Antibody Screen NEG    Sample Expiration      01/22/2022,2359 Performed at [facility], [place] [address] [place], [place] [address]   Troponin I (High Sensitivity)     Status: Abnormal   Collection Time: 01/19/22 12:48 AM  Result Value Ref Range   Troponin I (High Sensitivity) 22 (H) <18 ng/L    Comment: (NOTE) Elevated high sensitivity troponin I (hsTnI) values and significant  changes across serial measurements may suggest ACS but many other  chronic and acute conditions are known to elevate hsTnI results.  Refer to the "Links" section for chest pain algorithms and additional  guidance. Performed at [facility], [place] [address], [place], [place] [address]   Troponin I (High Sensitivity)     Status: Abnormal   Collection Time: 01/19/22  2:49 AM  Result Value Ref Range   Troponin I (High Sensitivity) 22 (H) <18 ng/L    Comment: (NOTE) Elevated high sensitivity troponin I (hsTnI) values and significant  changes across serial measurements may suggest ACS but many other  chronic and acute conditions are known to elevate hsTnI results.  Refer to the "Links" section for chest pain algorithms and additional  guidance. Performed at [facility], [place] [address], [place], [place] [address]   ABO/Rh     Status: None   Collection Time: 01/19/22  2:49 AM  Result Value Ref Range   ABO/RH(D)      O POS Performed at [facility], [place] [address], [place], [place] [address]     ECG   A-fib with RVR, LBBB- Personally Reviewed  Telemetry   A-fib with RVR- Personally Reviewed  Radiology   DG Chest 1 View  Result Date: 01/18/2022 CLINICAL DATA:  Fall, possible preop EXAM: CHEST  1 VIEW COMPARISON:  Radiograph 07/26/2014 FINDINGS: Mildly enlarged cardiac silhouette. No  focal airspace disease. No pleural effusion. No pneumothorax. No acute osseous abnormality. IMPRESSION: Cardiomegaly.  No focal airspace consolidation. No acute osseous abnormality on single frontal view of the chest. Electronically Signed   By: Maurine Simmering M.D.   On: 01/18/2022 14:28   DG Femur Min 2 Views Right  Result Date: 01/18/2022 CLINICAL DATA:  Recent fall. EXAM: RIGHT FEMUR 2 VIEWS COMPARISON:  None Available. FINDINGS: Acute impacted subcapital right femoral neck fracture. No dislocation. Degenerative changes of the right knee. The right hip joint space is relatively preserved. Soft tissues are unremarkable. IMPRESSION: 1. Acute impacted subcapital right femoral neck fracture. Electronically Signed   By: Titus Dubin M.D.   On: 01/18/2022 14:26   DG Lumbar Spine Complete  Result Date: 01/18/2022 CLINICAL DATA:  Recent fall. EXAM: LUMBAR SPINE - COMPLETE 4+ VIEW COMPARISON:  Lumbar spine x-rays dated March 31, 2009. FINDINGS: There is no evidence of lumbar spine fracture. Severe asymmetric disc height loss at L1-L2 and L5-S1. Moderate disc height loss at L4-L5. Severe left facet arthropathy at L5-S1. Findings have progressed since 2010. Incompletely visualized right femoral neck fracture. IMPRESSION: 1. No acute osseous abnormality of the lumbar spine. 2. Incompletely visualized right femoral neck fracture. Electronically Signed   By: Titus Dubin M.D.   On: 01/18/2022 14:25   DG Knee Complete 4 Views Right  Result Date: 01/18/2022 CLINICAL DATA:  Recent fall. EXAM: RIGHT KNEE - COMPLETE 4+ VIEW COMPARISON:  None Available. FINDINGS: No acute fracture or dislocation. No significant joint effusion. Tricompartmental degenerative changes with severe lateral compartment joint space narrowing. Osteopenia. Soft tissues are unremarkable. IMPRESSION: 1. No acute osseous abnormality. 2. Tricompartmental degenerative changes, severe in the lateral compartment. Electronically Signed   By: Titus Dubin M.D.   On: 01/18/2022 14:22   DG Hip Unilat With Pelvis 2-3 Views Right  Result Date: 01/18/2022 CLINICAL DATA:  Pain.  Status post fall. EXAM: DG HIP (WITH OR WITHOUT PELVIS) 2-3V RIGHT COMPARISON:  None Available. FINDINGS: There is an acute, impacted, subcapital right femoral neck fracture. No dislocation. No additional fractures identified. Lumbar scoliosis and degenerative disc disease noted. IMPRESSION: Acute, impacted, subcapital right femoral neck fracture. Electronically  Signed   By: Kerby Moors M.D.   On: 01/18/2022 14:16    Cardiac Studies   N/A  Impression   Principal Problem:   Hip fracture (HCC) Active Problems:   Longstanding persistent atrial fibrillation (HCC)   Rheumatoid arthritis (HCC)   Chronic systolic CHF (congestive heart failure) (HCC)   Hyperthyroidism   Malignant neoplasm of glottis (HCC)   Dehydration   Elevated troponin   Hypokalemia   LBBB (left bundle branch block)   Recommendation   Mrs. Boen presents with a right hip fracture and is in considerable pain.  No change in her heart rate was noted with pain medicine, however and she is in longstanding persistent if not permanent A-fib.  She has been started on every 6 hour metoprolol however heart rate now remains fast.  She was previously on diltiazem however that was discontinued apparently due to hypotension.  Blood pressure is better today.  I think we need to restart this to support her going to the operating room today.  Anticoagulation has been held apparently for surgery.  She has a history of mild cardiomyopathy, presumed nonischemic back in 2020.  A repeat echo has been ordered.  She does not appear to be in any acute heart failure on exam.  I suspect she would be able to proceed to surgery today at an acceptable risk.  Cardiology will follow up periodically.  Thanks for the consultation.  Time Spent Directly with Patient:  I have spent a total of 45 minutes with the patient  reviewing hospital notes, telemetry, EKGs, labs and examining the patient as well as establishing an assessment and plan that was discussed personally with the patient.  > 50% of time was spent in direct patient care.  Length of Stay:  LOS: 1 day   Pixie Casino, MD, [facility], [place] Director of the Advanced Lipid Disorders &  Cardiovascular Risk Reduction Clinic Diplomate of the American Board of Clinical Lipidology Attending Cardiologist  Direct Dial: [phone]  Fax: [phone]  Website:  [email or web address]   Nadean Corwin Rhonda Marshall 01/19/2022, 8:21 AM

## 2022-01-19 NOTE — Anesthesia Procedure Notes (Signed)
Procedure Name: Intubation Date/Time: 01/19/2022 5:40 PM  Performed by: Lavina Hamman, CRNAPre-anesthesia Checklist: Patient identified, Emergency Drugs available, Suction available, Patient being monitored and Timeout performed Patient Re-evaluated:Patient Re-evaluated prior to induction Oxygen Delivery Method: Circle system utilized Preoxygenation: Pre-oxygenation with 100% oxygen Induction Type: IV induction Ventilation: Mask ventilation without difficulty Laryngoscope Size: Mac and 3 Grade View: Grade I Tube type: Oral Tube size: 7.0 mm Number of attempts: 1 Airway Equipment and Method: Stylet Placement Confirmation: ETT inserted through vocal cords under direct vision, positive ETCO2, CO2 detector and breath sounds checked- equal and bilateral Secured at: 21 cm Tube secured with: Tape Dental Injury: Teeth and Oropharynx as per pre-operative assessment  Comments: ATOI

## 2022-01-20 ENCOUNTER — Other Ambulatory Visit: Payer: Self-pay | Admitting: Cardiology

## 2022-01-20 DIAGNOSIS — I447 Left bundle-branch block, unspecified: Secondary | ICD-10-CM | POA: Diagnosis not present

## 2022-01-20 DIAGNOSIS — S72001A Fracture of unspecified part of neck of right femur, initial encounter for closed fracture: Secondary | ICD-10-CM | POA: Diagnosis not present

## 2022-01-20 DIAGNOSIS — I4811 Longstanding persistent atrial fibrillation: Secondary | ICD-10-CM

## 2022-01-20 DIAGNOSIS — I5022 Chronic systolic (congestive) heart failure: Secondary | ICD-10-CM

## 2022-01-20 DIAGNOSIS — I5023 Acute on chronic systolic (congestive) heart failure: Secondary | ICD-10-CM

## 2022-01-20 LAB — BASIC METABOLIC PANEL
Anion gap: 8 (ref 5–15)
BUN: 13 mg/dL (ref 8–23)
CO2: 23 mmol/L (ref 22–32)
Calcium: 8.4 mg/dL — ABNORMAL LOW (ref 8.9–10.3)
Chloride: 101 mmol/L (ref 98–111)
Creatinine, Ser: 0.83 mg/dL (ref 0.44–1.00)
GFR, Estimated: >60 mL/min (ref >60)
Glucose, Bld: 168 mg/dL — ABNORMAL HIGH (ref 70–99)
Potassium: 4.4 mmol/L (ref 3.5–5.1)
Sodium: 132 mmol/L — ABNORMAL LOW (ref 135–145)

## 2022-01-20 LAB — CBC
HCT: 32.6 % — ABNORMAL LOW (ref 36.0–46.0)
Hemoglobin: 10.5 g/dL — ABNORMAL LOW (ref 12.0–15.0)
MCH: 31.5 pg (ref 26.0–34.0)
MCHC: 32.2 g/dL (ref 30.0–36.0)
MCV: 97.9 fL (ref 80.0–100.0)
Platelets: 199 10*3/uL (ref 150–400)
RBC: 3.33 MIL/uL — ABNORMAL LOW (ref 3.87–5.11)
RDW: 13.5 % (ref 11.5–15.5)
WBC: 6.3 10*3/uL (ref 4.0–10.5)
nRBC: 0 % (ref 0.0–0.2)

## 2022-01-20 LAB — T3: T3, Total: 112 ng/dL (ref 71–180)

## 2022-01-20 LAB — MAGNESIUM: Magnesium: 2.2 mg/dL (ref 1.7–2.4)

## 2022-01-20 MED ORDER — AMIODARONE LOAD VIA INFUSION
150.0000 mg | Freq: Once | INTRAVENOUS | Status: AC
  Administered 2022-01-20: 150 mg via INTRAVENOUS
  Filled 2022-01-20: qty 83.34

## 2022-01-20 MED ORDER — APIXABAN 5 MG PO TABS
5.0000 mg | ORAL_TABLET | Freq: Two times a day (BID) | ORAL | Status: DC
  Administered 2022-01-20 – 2022-01-27 (×15): 5 mg via ORAL
  Filled 2022-01-20 (×15): qty 1

## 2022-01-20 MED ORDER — OXYCODONE HCL 5 MG PO TABS
5.0000 mg | ORAL_TABLET | ORAL | Status: DC | PRN
  Administered 2022-01-20 – 2022-01-22 (×7): 10 mg via ORAL
  Filled 2022-01-20 (×7): qty 2

## 2022-01-20 MED ORDER — AMIODARONE HCL IN DEXTROSE 360-4.14 MG/200ML-% IV SOLN
60.0000 mg/h | INTRAVENOUS | Status: AC
  Administered 2022-01-20 (×2): 60 mg/h via INTRAVENOUS
  Filled 2022-01-20 (×2): qty 200

## 2022-01-20 MED ORDER — AMIODARONE HCL IN DEXTROSE 360-4.14 MG/200ML-% IV SOLN
30.0000 mg/h | INTRAVENOUS | Status: DC
  Administered 2022-01-20 – 2022-01-22 (×6): 30 mg/h via INTRAVENOUS
  Filled 2022-01-20 (×5): qty 200

## 2022-01-20 MED ORDER — LORAZEPAM 1 MG PO TABS
1.0000 mg | ORAL_TABLET | ORAL | Status: DC | PRN
  Administered 2022-01-20 – 2022-01-21 (×3): 1 mg via ORAL
  Administered 2022-01-21: 2 mg via ORAL
  Filled 2022-01-20 (×2): qty 1
  Filled 2022-01-20: qty 2
  Filled 2022-01-20: qty 1

## 2022-01-20 MED ORDER — HYDROMORPHONE HCL 1 MG/ML IJ SOLN
0.5000 mg | INTRAMUSCULAR | Status: DC | PRN
  Administered 2022-01-20 – 2022-01-23 (×6): 0.5 mg via INTRAVENOUS
  Filled 2022-01-20 (×6): qty 1

## 2022-01-20 MED ORDER — PHENYLEPHRINE HCL-NACL 20-0.9 MG/250ML-% IV SOLN
INTRAVENOUS | Status: AC
  Filled 2022-01-20: qty 250

## 2022-01-20 MED ORDER — HALOPERIDOL LACTATE 5 MG/ML IJ SOLN: 1.0000 mg | Freq: Four times a day (QID) | INTRAMUSCULAR | Status: DC | PRN

## 2022-01-20 NOTE — Progress Notes (Signed)
ANTICOAGULATION CONSULT NOTE   Pharmacy Consult for Apixaban Indication: atrial fibrillation  Allergies  Allergen Reactions   Clindamycin/Lincomycin Rash   Latex Rash    Latex gloves    Patient Measurements: Height: '5\' 1"'$  (154.9 cm) Weight: 50.3 kg (110 lb 14.3 oz) IBW/kg (Calculated) : 47.8 Heparin Dosing Weight:   Vital Signs: Temp: 97.5 F (36.4 C) (07/10 0420) Temp Source: Oral (07/10 0420) BP: 95/58 (07/10 0600) Pulse Rate: 70 (07/10 0600)  Labs: Recent Labs    01/18/22 1421 01/18/22 2216 01/19/22 0048 01/19/22 0249 01/19/22 1021 01/20/22 0251  HGB 12.4  --   --   --  12.1 10.5*  HCT 36.5  --   --   --  35.9* 32.6*  PLT 208  --   --   --  226 199  CREATININE 0.85 0.88  --   --  0.83 0.83  CKTOTAL  --  81  --   --   --   --   TROPONINIHS  --  22* 22* 22*  --   --     Estimated Creatinine Clearance: 46.2 mL/min (by C-G formula based on SCr of 0.83 mg/dL).   Medical History: Past Medical History:  Diagnosis Date   Anxiety    Atrial fibrillation ([place])    newly diagnosed 10/2010   Family history of adverse reaction to anesthesia    Sister's BP drops    Hypertension    Rheumatoid arthritis(714.0)    Subclinical hyperthyroidism     Medications:  Medications Prior to Admission  Medication Sig Dispense Refill Last Dose   ALPRAZolam (XANAX) 1 MG tablet Take 1 mg by mouth 3 (three) times daily as needed for anxiety.   0 01/18/2022   apixaban (ELIQUIS) 5 MG TABS tablet Take 1 tablet (5 mg total) by mouth 2 (two) times daily. NEEDS LABS, PLEASE COME TO OFFICE ASAP 30 tablet 0 01/17/2022 at 1345   diltiazem (CARDIZEM CD) 240 MG 24 hr capsule Take 240 mg by mouth daily.   01/17/2022   Multiple Vitamin (MULTIVITAMIN WITH MINERALS) TABS tablet Take 1 tablet by mouth daily.    01/17/2022   naloxone (NARCAN) nasal spray 4 mg/0.1 mL 1 spray as needed (accidental overdose).   unknown   oxyCODONE-acetaminophen (PERCOCET) 10-325 MG tablet Take 1 tablet by mouth 4 (four) times  daily as needed for pain.   01/18/2022   propylthiouracil (PTU) 50 MG tablet Take 1 tablet (50 mg total) by mouth 3 (three) times daily. 270 tablet 1 01/17/2022   Vitamin D, Ergocalciferol, (DRISDOL) 1.25 MG (50000 UNIT) CAPS capsule Take 50,000 Units by mouth once a week.   Past Week   metoprolol (TOPROL-XL) 200 MG 24 hr tablet Take 1 tablet (200 mg total) by mouth daily. (Patient not taking: Reported on 01/18/2022) 90 tablet 3 Completed Course   Scheduled:   acetaminophen  500 mg Oral Q6H   Chlorhexidine Gluconate Cloth  6 each Topical Daily   docusate sodium  100 mg Oral BID   ferrous sulfate  325 mg Oral TID PC   metoprolol tartrate  50 mg Oral Q6H   propylthiouracil  50 mg Oral TID   senna  1 tablet Oral BID   Infusions:   0.45 % NaCl with KCl 20 mEq / L 75 mL/hr at 01/20/22 0630   diltiazem (CARDIZEM) infusion Stopped (01/20/22 0101)   methocarbamol (ROBAXIN) IV Stopped (01/19/22 2136)   sodium chloride      Assessment: 86 yoF presented to ED  on 7/8 with hip pain after fall at home on 7/4 resulting in hip fracture.  She is s/p ARTHROPLASTY BIPOLAR HIP (HEMIARTHROPLASTY) on 7/9.  Pharmacy is consulted to resume her chronic apixaban anticoaguation for history of Afib if POD1 Hgb > 7. Today, 01/20/2022: CBC:  Hgb 10.5, Plt 199 No major bleeding complications reported.  SCr 0.83   Goal of Therapy:  Monitor platelets by anticoagulation protocol: Yes   Plan:  Resume apixaban 5 mg PO BID Pharmacy will sign off at this time.  Please reconsult if a change in clinical status warrants re-evaluation of dosage.  Gretta Arab PharmD, BCPS Clinical Pharmacist [facility] main pharmacy [phone] 01/20/2022 8:44 AM

## 2022-01-20 NOTE — Evaluation (Signed)
Clinical/Bedside Swallow Evaluation Patient Details  Name: Rhonda Marshall MRN: [id number] Date of Birth: 1950-09-23  Today's Date: 01/20/2022 Time: SLP Start Time (ACUTE ONLY): 2094 SLP Stop Time (ACUTE ONLY): 7096 SLP Time Calculation (min) (ACUTE ONLY): 25 min  Past Medical History:  Past Medical History:  Diagnosis Date   Anxiety    Atrial fibrillation ([place])    newly diagnosed 10/2010   Family history of adverse reaction to anesthesia    Sister's BP drops    Hypertension    Rheumatoid arthritis(714.0)    Subclinical hyperthyroidism    Past Surgical History:  Past Surgical History:  Procedure Laterality Date   Arm surgery     fracture, right arm   CARDIOVERSION N/A 08/10/2014   Procedure: CARDIOVERSION;  Surgeon: Dorothy Spark, MD;  Location: [facility];  Service: Cardiovascular;  Laterality: N/A;   CATARACT EXTRACTION     DIRECT LARYNGOSCOPY N/A 09/02/2019   Procedure: DIRECT LARYNGOSCOPY WITH BIOPSY;  Surgeon: Rozetta Nunnery, MD;  Location: [facility];  Service: ENT;  Laterality: N/A;   IR GASTROSTOMY TUBE MOD SED  10/17/2019   IR GASTROSTOMY TUBE REMOVAL  01/03/2020   TUBAL LIGATION     HPI:  71yo female admitted from home 01/18/22 with right hip pain from a fall 4 days ago. GEZ:MOQH, hypothyroidism, bilateral carotid bruit, RA, LBBB, systolic CHF, throat cancer    Assessment / Plan / Recommendation  Clinical Impression  Pt seen at bedside for swallow evalutaion. Family present reports no history of difficulty swallowing prior to admit. Pt does have a history of throat cancer 3-4 years ago, PEG tube now removed. Pt presents with missing dentition, CN exam unremarkable. Pt accepted trials of thin liquid, puree, and solid textures. No obvious oral issues or overt s/s aspiration following PO trials. Recommend continuing with regular solids,thin liquids. No further ST intervention recommended at this time. Please reconsult if needs arise.  SLP Visit  Diagnosis: Dysphagia, unspecified (R13.10)    Aspiration Risk  Mild aspiration risk    Diet Recommendation Regular;Thin liquid   Liquid Administration via: Cup;Straw Medication Administration: Whole meds with liquid Supervision: Patient able to self feed Compensations: Slow rate;Small sips/bites Postural Changes: Seated upright at 90 degrees    Other  Recommendations Oral Care Recommendations: Oral care BID    Recommendations for follow up therapy are one component of a multi-disciplinary discharge planning process, led by the attending physician.  Recommendations may be updated based on patient status, additional functional criteria and insurance authorization.  Follow up Recommendations No SLP follow up      Assistance Recommended at Discharge None  Functional Status Assessment Patient has had a recent decline in their functional status and demonstrates the ability to make significant improvements in function in a reasonable and predictable amount of time.      Prognosis Prognosis for Safe Diet Advancement: Good      Swallow Study   General Date of Onset: 01/18/22 HPI: 71yo female admitted from home 01/18/22 with right hip pain from a fall 4 days ago. UTM:LYYT, hypothyroidism, bilateral carotid bruit, RA, LBBB, systolic CHF, throat cancer Type of Study: Bedside Swallow Evaluation Diet Prior to this Study: Regular Temperature Spikes Noted: No Respiratory Status: Room air History of Recent Intubation: Yes Length of Intubations (days): 1 days (during surgery) Date extubated: 01/19/22 Behavior/Cognition: Alert;Cooperative;Pleasant mood Oral Cavity Assessment: Within Functional Limits Oral Care Completed by SLP: No Oral Cavity - Dentition: Missing dentition Vision: Functional for self-feeding Self-Feeding Abilities: Able to  feed self Patient Positioning: Upright in chair Baseline Vocal Quality: Normal Volitional Cough: Strong Volitional Swallow: Able to elicit     Oral/Motor/Sensory Function Overall Oral Motor/Sensory Function: Within functional limits   Ice Chips Ice chips: Not tested   Thin Liquid Thin Liquid: Within functional limits Presentation: Straw;Self Fed    Nectar Thick Nectar Thick Liquid: Not tested   Honey Thick Honey Thick Liquid: Not tested   Puree Puree: Within functional limits Presentation: Spoon;Self Fed   Solid     Solid: Within functional limits Presentation: Self Fed;Spoon     Majestic Molony B. Quentin Ore, [facility], [place] Speech Language Pathologist Office: [phone]  Shonna Chock 01/20/2022,2:48 PM

## 2022-01-20 NOTE — Progress Notes (Signed)
PROGRESS NOTE    Rhonda Marshall  GNF:[id number] DOB: 08/02/1950 DOA: 01/18/2022 PCP: Rhonda Marshall     Brief Narrative:  71 year old white female Rheumatoid arthritis ?  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   Assessment & Plan: Covid vaccination;   Principal Problem:   Hip fracture ([place]) Active Problems:   Longstanding persistent atrial fibrillation (HCC)   Rheumatoid arthritis (HCC)   Chronic systolic CHF (congestive heart failure) (HCC)   Hyperthyroidism   Malignant neoplasm of glottis (HCC)   Dehydration   Elevated troponin   Hypokalemia   LBBB (left bundle branch block)  Follow-up with right hip fracture Pain seems somewhat uncontrolled without being medicated Postop weightbearing status, anticoagulation, wound care as per orthopedics  A-fib RVR CHADVASC >3 -Eliquis has been restarted by surgery. - 7/10 Per cardiology Rhonda Marshall amiodarone drip -Metoprolol 50 mg QID -7/10 Rhonda Marshall cardiology to decide on cardioverting patient later this week  Compensated HFrEF -prior echo 45-50% 05/16/2019 -Strict in and out - Daily weight  Anxiety - Patient normally on Xanax at home however given her A-fib RVR, and anxiety concerning her hip fracture, poor cardiac function much better for patient's anxiety to be on a longer acting agent.  Will help control A-fib RVR - Hold home Xanax - Ativan 1 to 2 mg q 4hrs PRN - Haldol PRN   Rheumatoid arthritis not on medication Outpatient follow-up with primary physician  Prior toxic nodular goiter Thyroid function studies done on admission look  reasonable Okay to continue propylthiouracil 50 3 times daily         Mobility Assessment (last 72 hours)     Mobility Assessment   No documentation.             Interdisciplinary Goals of Care Family Meeting   Date carried out: 01/20/2022  Location of the meeting:   Member's involved:   Durable Power of Tour manager:     Discussion: We discussed goals of care for Rhonda Marshall .    Code status:   Disposition:   Time spent for the meeting:     Rhonda Marshall, Rhonda Docker, MD  01/20/2022, 8:22 AM         DVT prophylaxis: Eliquis Code Status: Full Family Communication:  Status is: Inpatient    Dispo: The patient is from: Home              Anticipated d/c is to: SNF              Anticipated d/c date is: > 3 days              Patient currently is not medically stable to d/c.      Consultants:  Cardiology Orthopedic surgery   Procedures/Significant Events:    I have personally reviewed and interpreted all radiology studies and my findings are as above.  VENTILATOR SETTINGS:    Cultures   Antimicrobials:    Devices   LINES / TUBES:      Continuous Infusions:  0Marshall45 % NaCl with KCl 20 mEq / L 75 mL/hr at 01/20/22 0630  diltiazem (CARDIZEM) infusion Stopped (01/20/22 0101)   methocarbamol (ROBAXIN) IV Stopped (01/19/22 2136)   sodium chloride       Objective: Vitals:   01/20/22 0400 01/20/22 0420 01/20/22 0500 01/20/22 0600  BP: 98/67  (!) 87/53 (!) 95/58  Pulse: 67  67 70  Resp: '14  13 16  '$ Temp:  (!) 97Marshall5 F (36Marshall4 C)    TempSrc:  Oral    SpO2: 94%  95% 96%  Weight:      Height:        Intake/Output Summary (Last 24 hours) at 01/20/2022 4315 Last data filed at 01/20/2022 0700 Gross per 24 hour  Intake 2593Marshall9 ml  Output 1400 ml  Net 1193Marshall9 ml   Filed Weights   01/18/22 2040  Weight: 50Marshall3 kg    Examination:  General: A/O x4, No acute respiratory distress Eyes: negative scleral  hemorrhage, negative anisocoria, negative icterus ENT: Negative Runny nose, negative gingival bleeding, Neck:  Negative scars, masses, torticollis, lymphadenopathy, JVD Lungs: Clear to auscultation bilaterally without wheezes or crackles Cardiovascular: Regular rate and rhythm without murmur gallop or rub normal S1 and S2 Abdomen: negative abdominal pain, nondistended, positive soft, bowel sounds, no rebound, no ascites, no appreciable mass Extremities: RIGHT hip pain Skin: Negative rashes, lesions, ulcers Psychiatric:  Negative depression, positive anxiety, negative fatigue, negative mania  Central nervous system:  Cranial nerves II through XII intact, tongue/uvula midline, all extremities muscle strength 5/5, sensation intact throughout, negative dysarthria, negative expressive aphasia, negative receptive aphasia.  .     Data Reviewed: Care during the described time interval was provided by me .  I have reviewed this patient's available data, including medical history, events of note, physical examination, and all test results as part of my evaluation.  CBC: Recent Labs  Lab 01/18/22 1421 01/19/22 1021 01/20/22 0251  WBC 9Marshall2 7Marshall4 6Marshall3  NEUTROABS 7Marshall5 5Marshall6  --   HGB 12Marshall4 12Marshall1 10Marshall5*  HCT 36Marshall5 35Marshall9* 32Marshall6*  MCV 93Marshall1 94Marshall7 97Marshall9  PLT 208 226 400   Basic Metabolic Panel: Recent Labs  Lab 01/18/22 1421 01/18/22 2216 01/19/22 1021 01/20/22 0251  NA 133* 135 135 132*  K 3Marshall8 3Marshall3* 4Marshall1 4Marshall4  CL 95* 99 101 101  CO2 '24 24 24 23  '$ GLUCOSE 98 127* 124* 168*  BUN '10 13 15 13  '$ CREATININE 0Marshall85 0Marshall88 0Marshall83 0Marshall83  CALCIUM 10Marshall2 8Marshall8* 9Marshall0 8Marshall4*  MG  --  2Marshall3  --  2Marshall2  PHOS  --  2Marshall5  --   --    GFR: Estimated Creatinine Clearance: 46Marshall2 mL/min (by C-G formula based on SCr of 0Marshall83 mg/dL). Liver Function Tests: Recent Labs  Lab 01/18/22 2216 01/19/22 1021  AST 25 24  ALT 21 24  ALKPHOS 101 103  BILITOT 1Marshall0 0Marshall9  PROT 7Marshall2 7Marshall2  ALBUMIN 3Marshall3* 3Marshall4*   No results for input(s): "LIPASE", "AMYLASE" in  the last 168 hours. No results for input(s): "AMMONIA" in the last 168 hours. Coagulation Profile: No results for input(s): "INR", "PROTIME" in the last 168 hours. Cardiac Enzymes: Recent Labs  Lab 01/18/22 2216  CKTOTAL 81   BNP (last 3 results) No results for input(s): "PROBNP" in the last 8760 hours. HbA1C: No results for input(s): "HGBA1C" in the last 72 hours. CBG: No results for input(s): "GLUCAP" in the last 168 hours. Lipid Profile: No results for input(s): "CHOL", "HDL", "LDLCALC", "TRIG", "CHOLHDL", "LDLDIRECT" in the last 72 hours. Thyroid Function Tests: Recent Labs    01/18/22 2216  TSH  1Marshall746  FREET4 1Marshall02   Anemia Panel: No results for input(s): "VITAMINB12", "FOLATE", "FERRITIN", "TIBC", "IRON", "RETICCTPCT" in the last 72 hours. Sepsis Labs: No results for input(s): "PROCALCITON", "LATICACIDVEN" in the last 168 hours.  Recent Results (from the past 240 hour(s))  MRSA Next Gen by PCR, Nasal     Status: None   Collection Time: 01/18/22  9:20 PM   Specimen: Nasal Mucosa; Nasal Swab  Result Value Ref Range Status   MRSA by PCR Next Gen NOT DETECTED NOT DETECTED Final    Comment: (NOTE) The GeneXpert MRSA Assay (FDA approved for NASAL specimens only), is one component of a comprehensive MRSA colonization surveillance program. It is not intended to diagnose MRSA infection nor to guide or monitor treatment for MRSA infections. Test performance is not FDA approved in patients less than 68 years old. Performed at [facility], [place] [address], [place], [place] [address]   Resp Panel by RT-PCR (Flu A&B, Covid) Anterior Nasal Swab     Status: None   Collection Time: 01/19/22  9:35 AM   Specimen: Anterior Nasal Swab  Result Value Ref Range Status   SARS Coronavirus 2 by RT PCR NEGATIVE NEGATIVE Final    Comment: (NOTE) SARS-CoV-2 target nucleic acids are NOT DETECTED.  The SARS-CoV-2 RNA is generally detectable in upper respiratory specimens  during the acute phase of infection. The lowest concentration of SARS-CoV-2 viral copies this assay can detect is 138 copies/mL. A negative result does not preclude SARS-Cov-2 infection and should not be used as the sole basis for treatment or other patient management decisions. A negative result may occur with  improper specimen collection/handling, submission of specimen other than nasopharyngeal swab, presence of viral mutation(s) within the areas targeted by this assay, and inadequate number of viral copies(<138 copies/mL). A negative result must be combined with clinical observations, patient history, and epidemiological information. The expected result is Negative.  Fact Sheet for Patients:  EntrepreneurPulseMarshallcomMarshallau  Fact Sheet for Healthcare Providers:  IncredibleEmploymentMarshallbe  This test is no t yet approved or cleared by the [place] FDA and  has been authorized for detection and/or diagnosis of SARS-CoV-2 by FDA under an Emergency Use Authorization (EUA). This EUA will remain  in effect (meaning this test can be used) for the duration of the COVID-19 declaration under Section 564(b)(1) of the Act, 21 UMarshallSMarshallCMarshallsection 360bbb-3(b)(1), unless the authorization is terminated  or revoked sooner.       Influenza A by PCR NEGATIVE NEGATIVE Final   Influenza B by PCR NEGATIVE NEGATIVE Final    Comment: (NOTE) The Xpert Xpress SARS-CoV-2/FLU/RSV plus assay is intended as an aid in the diagnosis of influenza from Nasopharyngeal swab specimens and should not be used as a sole basis for treatment. Nasal washings and aspirates are unacceptable for Xpert Xpress SARS-CoV-2/FLU/RSV testing.  Fact Sheet for Patients: EntrepreneurPulseMarshallcomMarshallau  Fact Sheet for Healthcare Providers: IncredibleEmploymentMarshallbe  This test is not yet approved or cleared by the [place] FDA and has been authorized for detection  and/or diagnosis of SARS-CoV-2 by FDA under an Emergency Use Authorization (EUA). This EUA will remain in effect (meaning this test can be used) for the duration of the COVID-19 declaration under Section 564(b)(1) of the Act, 21 UMarshallSMarshallC. section 360bbb-3(b)(1), unless the authorization is terminated or revoked.  Performed at [facility], [place] [address], [place], [place] [address]          Radiology Studies: Pelvis Portable  Result Date: 01/19/2022 CLINICAL DATA:  Status post right hip hemiarthroplasty EXAM: PORTABLE PELVIS 1-2 VIEWS COMPARISON:  01/18/2022 FINDINGS: Right hip hemiarthroplasty is now seen. Pelvic ring is intact. Degenerative changes of the lumbar spine are noted. IMPRESSION: Status post right hip hemiarthroplasty. Electronically Signed   By: Inez Catalina MMarshallD.   On: 01/19/2022 19:54   DG Hip Port Unilat With Pelvis 1V Right  Result Date: 01/19/2022 CLINICAL DATA:  Status post right hip replacement EXAM: DG HIP (WITH OR WITHOUT PELVIS) 1V PORT RIGHT COMPARISON:  01/18/2022 FINDINGS: Hip prosthesis is now seen. No acute fracture or dislocation is noted. IMPRESSION: Status post right hip replacement Electronically Signed   By: Inez Catalina MMarshallD.   On: 01/19/2022 19:52   ECHOCARDIOGRAM COMPLETE  Result Date: 01/19/2022    ECHOCARDIOGRAM REPORT   Patient Name:   MURL ZOGG Date of Exam: 01/19/2022 Medical Rec #:  [id number]        Height:       61Marshall0 in Accession #:    [id number]       Weight:       110Marshall9 lb Date of Birth:  22-Jan-1951        BSA:          1Marshall470 m Patient Age:    51 years         BP:           127/87 mmHg Patient Gender: F                HR:           114 bpm. Exam Location:  Inpatient Procedure: 2D Echo, Cardiac Doppler and Color Doppler Indications:    Cardiomegaly [id number]  History:        Patient has prior history of Echocardiogram examinations, most                 recent 05/16/2019. Arrythmias:Atrial Fibrillation; Risk                  Factors:Hypertension.  Sonographer:    Bernadene Person RDCS Referring Phys: [place]  1. Left ventricular ejection fraction, by estimation, is 30 to 35%. The left ventricle has moderately decreased function. The left ventricle demonstrates global hypokinesis. Left ventricular diastolic function could not be evaluated.  2. Right ventricular systolic function is mildly reduced. The right ventricular size is normal. There is normal pulmonary artery systolic pressure. The estimated right ventricular systolic pressure is 41Marshall9 mmHg.  3. Left atrial size was mildly dilated.  4. The mitral valve is grossly normal. Mild mitral valve regurgitation. No evidence of mitral stenosis.  5. The aortic valve is tricuspid. There is mild calcification of the aortic valve. There is mild thickening of the aortic valve. Aortic valve regurgitation is not visualized. Aortic valve sclerosis is present, with no evidence of aortic valve stenosis.  6. The inferior vena cava is normal in size with <50% respiratory variability, suggesting right atrial pressure of 8 mmHg. Comparison(s): Changes from prior study are noted. The left ventricular function is worsened. FINDINGS  Left Ventricle: Left ventricular ejection fraction, by estimation, is 30 to 35%. The left ventricle has moderately decreased function. The left ventricle demonstrates global hypokinesis. The left ventricular internal cavity size was normal in size. There is no left ventricular hypertrophy. Abnormal (paradoxical) septal motion, consistent with left bundle branch block. Left ventricular diastolic function could not be evaluated due to atrial fibrillation. Left ventricular diastolic function could not  be evaluated. Right Ventricle: The right  ventricular size is normal. No increase in right ventricular wall thickness. Right ventricular systolic function is mildly reduced. There is normal pulmonary artery systolic pressure. The tricuspid regurgitant  velocity is 2Marshall28 m/s, and with an assumed right atrial pressure of 8 mmHg, the estimated right ventricular systolic pressure is 78Marshall2 mmHg. Left Atrium: Left atrial size was mildly dilated. Right Atrium: Right atrial size was normal in size. Pericardium: There is no evidence of pericardial effusion. Mitral Valve: The mitral valve is grossly normal. Mild mitral valve regurgitation. No evidence of mitral valve stenosis. Tricuspid Valve: The tricuspid valve is grossly normal. Tricuspid valve regurgitation is trivial. No evidence of tricuspid stenosis. Aortic Valve: The aortic valve is tricuspid. There is mild calcification of the aortic valve. There is mild thickening of the aortic valve. Aortic valve regurgitation is not visualized. Aortic valve sclerosis is present, with no evidence of aortic valve stenosis. Pulmonic Valve: The pulmonic valve was grossly normal. Pulmonic valve regurgitation is mild. No evidence of pulmonic stenosis. Aorta: The aortic root and ascending aorta are structurally normal, with no evidence of dilitation. Venous: The inferior vena cava is normal in size with less than 50% respiratory variability, suggesting right atrial pressure of 8 mmHg. IAS/Shunts: The atrial septum is grossly normal.  LEFT VENTRICLE PLAX 2D LVIDd:         4Marshall40 cm LVIDs:         3Marshall60 cm LV PW:         1Marshall00 cm LV IVS:        0Marshall90 cm LVOT diam:     2Marshall00 cm LV SV:         35 LV SV Index:   24 LVOT Area:     3Marshall14 cm  LV Volumes (MOD) LV vol d, MOD A2C: 64Marshall5 ml LV vol d, MOD A4C: 82Marshall5 ml LV vol s, MOD A2C: 44Marshall5 ml LV vol s, MOD A4C: 46Marshall8 ml LV SV MOD A2C:     20Marshall0 ml LV SV MOD A4C:     82Marshall5 ml LV SV MOD BP:      27Marshall8 ml RIGHT VENTRICLE TAPSE (M-mode): 1Marshall5 cm LEFT ATRIUM             Index        RIGHT ATRIUM           Index LA diam:        4Marshall00 cm 2Marshall72 cm/m   RA Area:     18Marshall00 cm LA Vol (A2C):   62Marshall6 ml 42Marshall58 ml/m  RA Volume:   44Marshall20 ml  30Marshall07 ml/m LA Vol (A4C):   57Marshall8 ml 39Marshall32 ml/m LA Biplane Vol: 61Marshall1 ml 41Marshall56 ml/m   AORTIC VALVE LVOT Vmax:   80Marshall97 cm/s LVOT Vmean:  48Marshall967 cm/s LVOT VTI:    0Marshall111 m  AORTA Ao Root diam: 3Marshall20 cm Ao Asc diam:  3Marshall60 cm MR Peak grad:    77Marshall4 mmHg    TRICUSPID VALVE MR Mean grad:    49Marshall0 mmHg    TR Peak grad:   20Marshall8 mmHg MR Vmax:         440Marshall00 cm/s  TR Vmax:        228Marshall00 cm/s MR Vmean:        333Marshall0 cm/s MR PISA:         0Marshall57 cm     SHUNTS MR PISA Eff ROA: 5 mm        Systemic VTI:  0Marshall11 m MR PISA Radius:  0Marshall30 cm      Systemic Diam: 2Marshall00 cm Eleonore Chiquito MD Electronically signed by Eleonore Chiquito MD Signature Date/Time: 01/19/2022/12:57:27 PM    Final    DG Chest 1 View  Result Date: 01/18/2022 CLINICAL DATA:  Fall, possible preop EXAM: CHEST  1 VIEW COMPARISON:  Radiograph 07/26/2014 FINDINGS: Mildly enlarged cardiac silhouette. No focal airspace disease. No pleural effusion. No pneumothorax. No acute osseous abnormality. IMPRESSION: Cardiomegaly.  No focal airspace consolidation. No acute osseous abnormality on single frontal view of the chest. Electronically Signed   By: Maurine Simmering MMarshallD.   On: 01/18/2022 14:28   DG Femur Min 2 Views Right  Result Date: 01/18/2022 CLINICAL DATA:  Recent fall. EXAM: RIGHT FEMUR 2 VIEWS COMPARISON:  None Available. FINDINGS: Acute impacted subcapital right femoral neck fracture. No dislocation. Degenerative changes of the right knee. The right hip joint space is relatively preserved. Soft tissues are unremarkable. IMPRESSION: 1. Acute impacted subcapital right femoral neck fracture. Electronically Signed   By: Titus Dubin MMarshallD.   On: 01/18/2022 14:26   DG Lumbar Spine Complete  Result Date: 01/18/2022 CLINICAL DATA:  Recent fall. EXAM: LUMBAR SPINE - COMPLETE 4+ VIEW COMPARISON:  Lumbar spine x-rays dated March 31, 2009. FINDINGS: There is no evidence of lumbar spine fracture. Severe asymmetric disc height loss at L1-L2 and L5-S1. Moderate disc height loss at L4-L5. Severe left facet arthropathy at L5-S1. Findings have progressed since 2010.  Incompletely visualized right femoral neck fracture. IMPRESSION: 1. No acute osseous abnormality of the lumbar spine. 2. Incompletely visualized right femoral neck fracture. Electronically Signed   By: Titus Dubin MMarshallD.   On: 01/18/2022 14:25   DG Knee Complete 4 Views Right  Result Date: 01/18/2022 CLINICAL DATA:  Recent fall. EXAM: RIGHT KNEE - COMPLETE 4+ VIEW COMPARISON:  None Available. FINDINGS: No acute fracture or dislocation. No significant joint effusion. Tricompartmental degenerative changes with severe lateral compartment joint space narrowing. Osteopenia. Soft tissues are unremarkable. IMPRESSION: 1. No acute osseous abnormality. 2. Tricompartmental degenerative changes, severe in the lateral compartment. Electronically Signed   By: Titus Dubin MMarshallD.   On: 01/18/2022 14:22   DG Hip Unilat With Pelvis 2-3 Views Right  Result Date: 01/18/2022 CLINICAL DATA:  Pain.  Status post fall. EXAM: DG HIP (WITH OR WITHOUT PELVIS) 2-3V RIGHT COMPARISON:  None Available. FINDINGS: There is an acute, impacted, subcapital right femoral neck fracture. No dislocation. No additional fractures identified. Lumbar scoliosis and degenerative disc disease noted. IMPRESSION: Acute, impacted, subcapital right femoral neck fracture. Electronically Signed   By: Kerby Moors MMarshallD.   On: 01/18/2022 14:16        Scheduled Meds:  acetaminophen  500 mg Oral Q6H   Chlorhexidine Gluconate Cloth  6 each Topical Daily   docusate sodium  100 mg Oral BID   ferrous sulfate  325 mg Oral TID PC   metoprolol tartrate  50 mg Oral Q6H   propylthiouracil  50 mg Oral TID   senna  1 tablet Oral BID   Continuous Infusions:  0Marshall45 % NaCl with KCl 20 mEq / L 75 mL/hr at 01/20/22 0630   diltiazem (CARDIZEM) infusion Stopped (01/20/22 0101)   methocarbamol (ROBAXIN) IV Stopped (01/19/22 2136)   sodium chloride       LOS: 2 days    Time spent:40 min    Brenten Janney, Rhonda Docker, MD [facility]   If 7PM-7AM, please  contact night-coverage 01/20/2022, 8:22 AM

## 2022-01-20 NOTE — Plan of Care (Signed)
  Problem: Education: Goal: Knowledge of General Education information will improve Description: Including pain rating scale, medication(s)/side effects and non-pharmacologic comfort measures Outcome: Progressing   Problem: Clinical Measurements: Goal: Respiratory complications will improve Outcome: Progressing   Problem: Nutrition: Goal: Adequate nutrition will be maintained Outcome: Progressing   Problem: Coping: Goal: Level of anxiety will decrease Outcome: Progressing   Problem: Pain Managment: Goal: General experience of comfort will improve Outcome: Progressing   Problem: Education: Goal: Knowledge of the prescribed therapeutic regimen will improve Outcome: Progressing   Problem: Activity: Goal: Ability to avoid complications of mobility impairment will improve Outcome: Progressing   Problem: Pain Management: Goal: Pain level will decrease with appropriate interventions Outcome: Progressing   Problem: Skin Integrity: Goal: Will show signs of wound healing Outcome: Progressing

## 2022-01-20 NOTE — TOC Initial Note (Signed)
Transition of Care [facility]) - Initial/Assessment Note    Patient Details  Name: Rhonda Marshall MRN: [id number] Date of Birth: 07-23-1950  Transition of [facility]) CM/SW Contact:    Leeroy Cha, RN Phone Number: 01/20/2022, 10:15 AM  Clinical Narrative:                 Fractured hip with repair and replacement.  Most likely ill need snf placememtn.  Expected Discharge Plan: Skilled Nursing Facility Barriers to Discharge: Continued Medical Work up   Patient Goals and CMS Choice Patient states their goals for this hospitalization and ongoing recovery are:: to get better and go home CMS Medicare.gov Compare Post Acute Care list provided to:: Patient    Expected Discharge Plan and Services Expected Discharge Plan: [place]   Discharge Planning Services: CM Consult   Living arrangements for the past 2 months: Single Family Home                                      Prior Living Arrangements/Services Living arrangements for the past 2 months: Single Family Home Lives with:: Self Patient language and need for interpreter reviewed:: Yes Do you feel safe going back to the place where you live?: Yes               Activities of Daily Living Home Assistive Devices/Equipment: None ADL Screening (condition at time of admission) Patient's cognitive ability adequate to safely complete daily activities?: Yes Is the patient deaf or have difficulty hearing?: Yes Does the patient have difficulty seeing, even when wearing glasses/contacts?: No Does the patient have difficulty concentrating, remembering, or making decisions?: No Patient able to express need for assistance with ADLs?: Yes Does the patient have difficulty dressing or bathing?: No Independently performs ADLs?: Yes (appropriate for developmental age) Does the patient have difficulty walking or climbing stairs?: Yes (broken hip, doesnt have issue baseline) Weakness of Legs: None Weakness of  Arms/Hands: None  Permission Sought/Granted                  Emotional Assessment Appearance:: Appears stated age     Orientation: : Oriented to Self, Oriented to Place, Oriented to  Time, Oriented to Situation Alcohol / Substance Use: Not Applicable Psych Involvement: No (comment)  Admission diagnosis:  Hip fracture ([place]) [S72.009A] Atrial fibrillation with RVR ([place]) [I48.91] Closed fracture of right hip, initial encounter ([place]) [S72.001A] Patient Active Problem List   Diagnosis Date Noted   Elevated troponin 01/19/2022   Hypokalemia 01/19/2022   LBBB (left bundle branch block) 01/19/2022   Hip fracture ([place]) 01/18/2022   Dehydration 10/10/2019   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 [place] [address] Phone: [phone] Fax: [phone]     Social Determinants of Health (SDOH) Interventions    Readmission Risk Interventions     No data to display

## 2022-01-20 NOTE — Progress Notes (Addendum)
DAILY PROGRESS NOTE   Patient Name: Rhonda Marshall Date of Encounter: 01/20/2022 Cardiologist: Minus Breeding, MD  Chief Complaint   Hip pain  Patient Profile   70 yo female with longstanding persistent afib, LBBB, presented with femur fracture, asked to see for afib and preop  Subjective   Tolerated surgery well -echo yesterday showed worsening systolic function with LVEF 30-35%, suspect tachycardia-mediated with regards to afib. Overnight rate control suboptimal - diltiazem stopped, possibly for hypotension - remains in afib with RVR. On 75 cc/hr IVF,not recommended for CHF. Restarted on Eliquis today.  Objective   Vitals:   01/20/22 0500 01/20/22 0600 01/20/22 0758 01/20/22 0805  BP: (!) 87/53 (!) 95/58 (!) 148/98 112/90  Pulse: 67 70  (!) 109  Resp: 13 16 (!) 31 19  Temp:    (!) 97.5 F (36.4 C)  TempSrc:    Oral  SpO2: 95% 96% 96% 97%  Weight:      Height:        Intake/Output Summary (Last 24 hours) at 01/20/2022 0929 Last data filed at 01/20/2022 0700 Gross per 24 hour  Intake 2593.9 ml  Output 1200 ml  Net 1393.9 ml   Filed Weights   01/18/22 2040  Weight: 50.3 kg    Physical Exam   General appearance: alert and no distress Neck: no carotid bruit, no JVD, and thyroid not enlarged, symmetric, no tenderness/mass/nodules Lungs: clear to auscultation bilaterally Heart: irregularly irregular rhythm and tachycardic Abdomen: soft, non-tender; bowel sounds normal; no masses,  no organomegaly Extremities: edema trace bilateral pedal Pulses: 2+ and symmetric Skin: Skin color, texture, turgor normal. No rashes or lesions Neurologic: Grossly normal Psych: Tearful  Inpatient Medications    Scheduled Meds:  acetaminophen  500 mg Oral Q6H   apixaban  5 mg Oral BID   Chlorhexidine Gluconate Cloth  6 each Topical Daily   docusate sodium  100 mg Oral BID   ferrous sulfate  325 mg Oral TID PC   metoprolol tartrate  50 mg Oral Q6H   propylthiouracil  50 mg  Oral TID   senna  1 tablet Oral BID    Continuous Infusions:  0.45 % NaCl with KCl 20 mEq / L 75 mL/hr at 01/20/22 0924   diltiazem (CARDIZEM) infusion Stopped (01/20/22 0101)   methocarbamol (ROBAXIN) IV Stopped (01/19/22 2136)   sodium chloride      PRN Meds: acetaminophen, alum & mag hydroxide-simeth, bisacodyl, HYDROmorphone (DILAUDID) injection, menthol-cetylpyridinium **OR** phenol, methocarbamol **OR** methocarbamol (ROBAXIN) IV, ondansetron **OR** ondansetron (ZOFRAN) IV, mouth rinse, oxyCODONE, polyethylene glycol   Labs   Results for orders placed or performed during the hospital encounter of 01/18/22 (from the past 48 hour(s))  CBC with Differential     Status: None   Collection Time: 01/18/22  2:21 PM  Result Value Ref Range   WBC 9.2 4.0 - 10.5 K/uL   RBC 3.92 3.87 - 5.11 MIL/uL   Hemoglobin 12.4 12.0 - 15.0 g/dL   HCT 36.5 36.0 - 46.0 %   MCV 93.1 80.0 - 100.0 fL   MCH 31.6 26.0 - 34.0 pg   MCHC 34.0 30.0 - 36.0 g/dL   RDW 13.6 11.5 - 15.5 %   Platelets 208 150 - 400 K/uL   nRBC 0.0 0.0 - 0.2 %   Neutrophils Relative % 83 %   Neutro Abs 7.5 1.7 - 7.7 K/uL   Lymphocytes Relative 9 %   Lymphs Abs 0.8 0.7 - 4.0 K/uL   Monocytes Relative 8 %  Monocytes Absolute 0.7 0.1 - 1.0 K/uL   Eosinophils Relative 0 %   Eosinophils Absolute 0.0 0.0 - 0.5 K/uL   Basophils Relative 0 %   Basophils Absolute 0.0 0.0 - 0.1 K/uL   Immature Granulocytes 0 %   Abs Immature Granulocytes 0.04 0.00 - 0.07 K/uL    Comment: Performed at [facility], [address] [place], [place] [address]  Basic metabolic panel     Status: Abnormal   Collection Time: 01/18/22  2:21 PM  Result Value Ref Range   Sodium 133 (L) 135 - 145 mmol/L   Potassium 3.8 3.5 - 5.1 mmol/L   Chloride 95 (L) 98 - 111 mmol/L   CO2 24 22 - 32 mmol/L   Glucose, Bld 98 70 - 99 mg/dL    Comment: Glucose reference range applies only to samples taken after fasting for at least 8 hours.   BUN 10  8 - 23 mg/dL   Creatinine, Ser 0.85 0.44 - 1.00 mg/dL   Calcium 10.2 8.9 - 10.3 mg/dL   GFR, Estimated >60 >60 mL/min    Comment: (NOTE) Calculated using the CKD-EPI Creatinine Equation (2021)    Anion gap 14 5 - 15    Comment: Performed at [facility], [address] [place], [place] [address]  MRSA Next Gen by PCR, Nasal     Status: None   Collection Time: 01/18/22  9:20 PM   Specimen: Nasal Mucosa; Nasal Swab  Result Value Ref Range   MRSA by PCR Next Gen NOT DETECTED NOT DETECTED    Comment: (NOTE) The GeneXpert MRSA Assay (FDA approved for NASAL specimens only), is one component of a comprehensive MRSA colonization surveillance program. It is not intended to diagnose MRSA infection nor to guide or monitor treatment for MRSA infections. Test performance is not FDA approved in patients less than 21 years old. Performed at [facility], [place] [address], [place], [place] [address]   Comprehensive metabolic panel     Status: Abnormal   Collection Time: 01/18/22 10:16 PM  Result Value Ref Range   Sodium 135 135 - 145 mmol/L   Potassium 3.3 (L) 3.5 - 5.1 mmol/L   Chloride 99 98 - 111 mmol/L   CO2 24 22 - 32 mmol/L   Glucose, Bld 127 (H) 70 - 99 mg/dL    Comment: Glucose reference range applies only to samples taken after fasting for at least 8 hours.   BUN 13 8 - 23 mg/dL   Creatinine, Ser 0.88 0.44 - 1.00 mg/dL   Calcium 8.8 (L) 8.9 - 10.3 mg/dL   Total Protein 7.2 6.5 - 8.1 g/dL   Albumin 3.3 (L) 3.5 - 5.0 g/dL   AST 25 15 - 41 U/L   ALT 21 0 - 44 U/L   Alkaline Phosphatase 101 38 - 126 U/L   Total Bilirubin 1.0 0.3 - 1.2 mg/dL   GFR, Estimated >60 >60 mL/min    Comment: (NOTE) Calculated using the CKD-EPI Creatinine Equation (2021)    Anion gap 12 5 - 15    Comment: Performed at [facility], [place] [address] [place], [place] [address]  Magnesium     Status: None   Collection Time: 01/18/22 10:16 PM  Result  Value Ref Range   Magnesium 2.3 1.7 - 2.4 mg/dL    Comment: Performed at [facility], [place] [address], [place], [place] [address]  Phosphorus     Status: None  Collection Time: 01/18/22 10:16 PM  Result Value Ref Range   Phosphorus 2.5 2.5 - 4.6 mg/dL    Comment: Performed at [facility], [place] [address], [place], [place] [address]  Troponin I (High Sensitivity)     Status: Abnormal   Collection Time: 01/18/22 10:16 PM  Result Value Ref Range   Troponin I (High Sensitivity) 22 (H) <18 ng/L    Comment: (NOTE) Elevated high sensitivity troponin I (hsTnI) values and significant  changes across serial measurements may suggest ACS but many other  chronic and acute conditions are known to elevate hsTnI results.  Refer to the "Links" section for chest pain algorithms and additional  guidance. Performed at [facility], [place] [address] [place], [place] [address]   TSH     Status: None   Collection Time: 01/18/22 10:16 PM  Result Value Ref Range   TSH 1.746 0.350 - 4.500 uIU/mL    Comment: Performed by a 3rd Generation assay with a functional sensitivity of <=0.01 uIU/mL. Performed at [facility], [place] [address], [place], [place] [address]   T3     Status: None   Collection Time: 01/18/22 10:16 PM  Result Value Ref Range   T3, Total 112 71 - 180 ng/dL    Comment: (NOTE) Performed At: [facility] [place], [place] [id number] Rush Farmer MD CZ:[id number]   T4, free     Status: None   Collection Time: 01/18/22 10:16 PM  Result Value Ref Range   Free T4 1.02 0.61 - 1.12 ng/dL    Comment: (NOTE) Biotin ingestion may interfere with free T4 tests. If the results are inconsistent with the TSH level, previous test results, or the clinical presentation, then consider biotin interference. If needed, order repeat testing after stopping biotin. Performed at [facility], [place]  [address], [place], [place] [address]   CK     Status: None   Collection Time: 01/18/22 10:16 PM  Result Value Ref Range   Total CK 81 38 - 234 U/L    Comment: Performed at [facility], [place] [address], [place], [place] [address]  VITAMIN D 25 Hydroxy (Vit-D Deficiency, Fractures)     Status: None   Collection Time: 01/18/22 10:21 PM  Result Value Ref Range   Vit D, 25-Hydroxy 33.96 30 - 100 ng/mL    Comment: (NOTE) Vitamin D deficiency has been defined by the [place] practice guideline as a level of serum 25-OH  vitamin D less than 20 ng/mL (1,2). The Endocrine Society went on to  further define vitamin D insufficiency as a level between 21 and 29  ng/mL (2).  1. IOM (Institute of Medicine). 2010. Dietary reference intakes for  calcium and D. [place]: The [employer]. 2. Holick MF, Binkley Dillingham, Bischoff-Ferrari HA, et al. Evaluation,  treatment, and prevention of vitamin D deficiency: an Endocrine  Society clinical practice guideline, JCEM. 2011 Jul; 96(7): 1911-30.  Performed at [facility], [place] [address], [place],  [address]   Type and screen     Status: None   Collection Time: 01/19/22 12:46 AM  Result Value Ref Range   ABO/RH(D) O POS    Antibody Screen NEG    Sample Expiration      01/22/2022,2359 Performed at [facility], [place] [address] [place], [place] [address]   Troponin I (High Sensitivity)  Status: Abnormal   Collection Time: 01/19/22 12:48 AM  Result Value Ref Range   Troponin I (High Sensitivity) 22 (H) <18 ng/L    Comment: (NOTE) Elevated high sensitivity troponin I (hsTnI) values and significant  changes across serial measurements may suggest ACS but many other  chronic and acute conditions are known to elevate hsTnI results.  Refer to the "Links" section for chest pain algorithms and additional  guidance. Performed at [facility], [place] [address], [place], [place] [address]   Troponin I (High Sensitivity)     Status: Abnormal   Collection Time: 01/19/22  2:49 AM  Result Value Ref Range   Troponin I (High Sensitivity) 22 (H) <18 ng/L    Comment: (NOTE) Elevated high sensitivity troponin I (hsTnI) values and significant  changes across serial measurements may suggest ACS but many other  chronic and acute conditions are known to elevate hsTnI results.  Refer to the "Links" section for chest pain algorithms and additional  guidance. Performed at [facility], [place] [address] [place], [place] [address]   ABO/Rh     Status: None   Collection Time: 01/19/22  2:49 AM  Result Value Ref Range   ABO/RH(D)      O POS Performed at [facility], [place] [address], [place], [place] [address]   Resp Panel by RT-PCR (Flu A&B, Covid) Anterior Nasal Swab     Status: None   Collection Time: 01/19/22  9:35 AM   Specimen: Anterior Nasal Swab  Result Value Ref Range   SARS Coronavirus 2 by RT PCR NEGATIVE NEGATIVE    Comment: (NOTE) SARS-CoV-2 target nucleic acids are NOT DETECTED.  The SARS-CoV-2 RNA is generally detectable in upper respiratory specimens during the acute phase of infection. The lowest concentration of SARS-CoV-2 viral copies this assay can detect is 138 copies/mL. A negative result does not preclude SARS-Cov-2 infection and should not be used as the sole basis for treatment or other patient management decisions. A negative result may occur with  improper specimen collection/handling, submission of specimen other than nasopharyngeal swab, presence of viral mutation(s) within the areas targeted by this assay, and inadequate number of viral copies(<138 copies/mL). A negative result must be combined with clinical observations, patient history, and epidemiological information. The expected result is Negative.  Fact Sheet for Patients:   EntrepreneurPulse.com.au  Fact Sheet for Healthcare Providers:  IncredibleEmployment.be  This test is no t yet approved or cleared by the [place] FDA and  has been authorized for detection and/or diagnosis of SARS-CoV-2 by FDA under an Emergency Use Authorization (EUA). This EUA will remain  in effect (meaning this test can be used) for the duration of the COVID-19 declaration under Section 564(b)(1) of the Act, 21 U.S.C.section 360bbb-3(b)(1), unless the authorization is terminated  or revoked sooner.       Influenza A by PCR NEGATIVE NEGATIVE   Influenza B by PCR NEGATIVE NEGATIVE    Comment: (NOTE) The Xpert Xpress SARS-CoV-2/FLU/RSV plus assay is intended as an aid in the diagnosis of influenza from Nasopharyngeal swab specimens and should not be used as a sole basis for treatment. Nasal washings and aspirates are unacceptable for Xpert Xpress SARS-CoV-2/FLU/RSV testing.  Fact Sheet for Patients: EntrepreneurPulse.com.au  Fact Sheet for Healthcare Providers: IncredibleEmployment.be  This test is not yet approved or cleared by the [place] FDA and has been authorized for detection and/or diagnosis of SARS-CoV-2 by FDA under an Emergency Use Authorization (EUA). This  EUA will remain in effect (meaning this test can be used) for the duration of the COVID-19 declaration under Section 564(b)(1) of the Act, 21 U.S.C. section 360bbb-3(b)(1), unless the authorization is terminated or revoked.  Performed at [facility], [place] [address], [place], [place] [address]   Comprehensive metabolic panel     Status: Abnormal   Collection Time: 01/19/22 10:21 AM  Result Value Ref Range   Sodium 135 135 - 145 mmol/L   Potassium 4.1 3.5 - 5.1 mmol/L    Comment: DELTA CHECK NOTED NO VISIBLE HEMOLYSIS    Chloride 101 98 - 111 mmol/L   CO2 24 22 - 32 mmol/L   Glucose, Bld 124 (H) 70 -  99 mg/dL    Comment: Glucose reference range applies only to samples taken after fasting for at least 8 hours.   BUN 15 8 - 23 mg/dL   Creatinine, Ser 0.83 0.44 - 1.00 mg/dL   Calcium 9.0 8.9 - 10.3 mg/dL   Total Protein 7.2 6.5 - 8.1 g/dL   Albumin 3.4 (L) 3.5 - 5.0 g/dL   AST 24 15 - 41 U/L   ALT 24 0 - 44 U/L   Alkaline Phosphatase 103 38 - 126 U/L   Total Bilirubin 0.9 0.3 - 1.2 mg/dL   GFR, Estimated >60 >60 mL/min    Comment: (NOTE) Calculated using the CKD-EPI Creatinine Equation (2021)    Anion gap 10 5 - 15    Comment: Performed at [facility], [place] [address], [place], [place] [address]  CBC with Differential/Platelet     Status: Abnormal   Collection Time: 01/19/22 10:21 AM  Result Value Ref Range   WBC 7.4 4.0 - 10.5 K/uL   RBC 3.79 (L) 3.87 - 5.11 MIL/uL   Hemoglobin 12.1 12.0 - 15.0 g/dL   HCT 35.9 (L) 36.0 - 46.0 %   MCV 94.7 80.0 - 100.0 fL   MCH 31.9 26.0 - 34.0 pg   MCHC 33.7 30.0 - 36.0 g/dL   RDW 13.7 11.5 - 15.5 %   Platelets 226 150 - 400 K/uL   nRBC 0.0 0.0 - 0.2 %   Neutrophils Relative % 76 %   Neutro Abs 5.6 1.7 - 7.7 K/uL   Lymphocytes Relative 13 %   Lymphs Abs 0.9 0.7 - 4.0 K/uL   Monocytes Relative 10 %   Monocytes Absolute 0.7 0.1 - 1.0 K/uL   Eosinophils Relative 1 %   Eosinophils Absolute 0.1 0.0 - 0.5 K/uL   Basophils Relative 0 %   Basophils Absolute 0.0 0.0 - 0.1 K/uL   Immature Granulocytes 0 %   Abs Immature Granulocytes 0.02 0.00 - 0.07 K/uL    Comment: Performed at [facility], [place] [address] [place], [place] [address]  CBC     Status: Abnormal   Collection Time: 01/20/22  2:51 AM  Result Value Ref Range   WBC 6.3 4.0 - 10.5 K/uL   RBC 3.33 (L) 3.87 - 5.11 MIL/uL   Hemoglobin 10.5 (L) 12.0 - 15.0 g/dL   HCT 32.6 (L) 36.0 - 46.0 %   MCV 97.9 80.0 - 100.0 fL   MCH 31.5 26.0 - 34.0 pg   MCHC 32.2 30.0 - 36.0 g/dL   RDW 13.5 11.5 - 15.5 %   Platelets 199 150 - 400 K/uL   nRBC 0.0 0.0 -  0.2 %    Comment: Performed at [facility], [place] [address] [place],  [place] [address]  Basic metabolic panel     Status: Abnormal   Collection Time: 01/20/22  2:51 AM  Result Value Ref Range   Sodium 132 (L) 135 - 145 mmol/L   Potassium 4.4 3.5 - 5.1 mmol/L   Chloride 101 98 - 111 mmol/L   CO2 23 22 - 32 mmol/L   Glucose, Bld 168 (H) 70 - 99 mg/dL    Comment: Glucose reference range applies only to samples taken after fasting for at least 8 hours.   BUN 13 8 - 23 mg/dL   Creatinine, Ser 0.83 0.44 - 1.00 mg/dL   Calcium 8.4 (L) 8.9 - 10.3 mg/dL   GFR, Estimated >60 >60 mL/min    Comment: (NOTE) Calculated using the CKD-EPI Creatinine Equation (2021)    Anion gap 8 5 - 15    Comment: Performed at [facility], [place] [address] [place], [place] [address]  Magnesium     Status: None   Collection Time: 01/20/22  2:51 AM  Result Value Ref Range   Magnesium 2.2 1.7 - 2.4 mg/dL    Comment: Performed at [facility], [place] [address], [place], [place] [address]    ECG   N/A  Telemetry   Afib with RVR - Personally Reviewed  Radiology    Pelvis Portable  Result Date: 01/19/2022 CLINICAL DATA:  Status post right hip hemiarthroplasty EXAM: PORTABLE PELVIS 1-2 VIEWS COMPARISON:  01/18/2022 FINDINGS: Right hip hemiarthroplasty is now seen. Pelvic ring is intact. Degenerative changes of the lumbar spine are noted. IMPRESSION: Status post right hip hemiarthroplasty. Electronically Signed   By: Inez Catalina M.D.   On: 01/19/2022 19:54   DG Hip Port Unilat With Pelvis 1V Right  Result Date: 01/19/2022 CLINICAL DATA:  Status post right hip replacement EXAM: DG HIP (WITH OR WITHOUT PELVIS) 1V PORT RIGHT COMPARISON:  01/18/2022 FINDINGS: Hip prosthesis is now seen. No acute fracture or dislocation is noted. IMPRESSION: Status post right hip replacement Electronically Signed   By: Inez Catalina M.D.   On: 01/19/2022 19:52   ECHOCARDIOGRAM  COMPLETE  Result Date: 01/19/2022    ECHOCARDIOGRAM REPORT   Patient Name:   Rhonda Marshall Date of Exam: 01/19/2022 Medical Rec #:  [id number]        Height:       61.0 in Accession #:    [id number]       Weight:       110.9 lb Date of Birth:  May 30, 1951        BSA:          1.470 m Patient Age:    45 years         BP:           127/87 mmHg Patient Gender: F                HR:           114 bpm. Exam Location:  Inpatient Procedure: 2D Echo, Cardiac Doppler and Color Doppler Indications:    Cardiomegaly I51.7  History:        Patient has prior history of Echocardiogram examinations, most                 recent 05/16/2019. Arrythmias:Atrial Fibrillation; Risk                 Factors:Hypertension.  Sonographer:    Bernadene Person RDCS Referring Phys: [place]  1. Left ventricular ejection fraction, by  estimation, is 30 to 35%. The left ventricle has moderately decreased function. The left ventricle demonstrates global hypokinesis. Left ventricular diastolic function could not be evaluated.  2. Right ventricular systolic function is mildly reduced. The right ventricular size is normal. There is normal pulmonary artery systolic pressure. The estimated right ventricular systolic pressure is 24.2 mmHg.  3. Left atrial size was mildly dilated.  4. The mitral valve is grossly normal. Mild mitral valve regurgitation. No evidence of mitral stenosis.  5. The aortic valve is tricuspid. There is mild calcification of the aortic valve. There is mild thickening of the aortic valve. Aortic valve regurgitation is not visualized. Aortic valve sclerosis is present, with no evidence of aortic valve stenosis.  6. The inferior vena cava is normal in size with <50% respiratory variability, suggesting right atrial pressure of 8 mmHg. Comparison(s): Changes from prior study are noted. The left ventricular function is worsened. FINDINGS  Left Ventricle: Left ventricular ejection fraction, by estimation, is 30 to 35%.  The left ventricle has moderately decreased function. The left ventricle demonstrates global hypokinesis. The left ventricular internal cavity size was normal in size. There is no left ventricular hypertrophy. Abnormal (paradoxical) septal motion, consistent with left bundle branch block. Left ventricular diastolic function could not be evaluated due to atrial fibrillation. Left ventricular diastolic function could not  be evaluated. Right Ventricle: The right ventricular size is normal. No increase in right ventricular wall thickness. Right ventricular systolic function is mildly reduced. There is normal pulmonary artery systolic pressure. The tricuspid regurgitant velocity is 2.28 m/s, and with an assumed right atrial pressure of 8 mmHg, the estimated right ventricular systolic pressure is 68.3 mmHg. Left Atrium: Left atrial size was mildly dilated. Right Atrium: Right atrial size was normal in size. Pericardium: There is no evidence of pericardial effusion. Mitral Valve: The mitral valve is grossly normal. Mild mitral valve regurgitation. No evidence of mitral valve stenosis. Tricuspid Valve: The tricuspid valve is grossly normal. Tricuspid valve regurgitation is trivial. No evidence of tricuspid stenosis. Aortic Valve: The aortic valve is tricuspid. There is mild calcification of the aortic valve. There is mild thickening of the aortic valve. Aortic valve regurgitation is not visualized. Aortic valve sclerosis is present, with no evidence of aortic valve stenosis. Pulmonic Valve: The pulmonic valve was grossly normal. Pulmonic valve regurgitation is mild. No evidence of pulmonic stenosis. Aorta: The aortic root and ascending aorta are structurally normal, with no evidence of dilitation. Venous: The inferior vena cava is normal in size with less than 50% respiratory variability, suggesting right atrial pressure of 8 mmHg. IAS/Shunts: The atrial septum is grossly normal.  LEFT VENTRICLE PLAX 2D LVIDd:         4.40  cm LVIDs:         3.60 cm LV PW:         1.00 cm LV IVS:        0.90 cm LVOT diam:     2.00 cm LV SV:         35 LV SV Index:   24 LVOT Area:     3.14 cm  LV Volumes (MOD) LV vol d, MOD A2C: 64.5 ml LV vol d, MOD A4C: 82.5 ml LV vol s, MOD A2C: 44.5 ml LV vol s, MOD A4C: 46.8 ml LV SV MOD A2C:     20.0 ml LV SV MOD A4C:     82.5 ml LV SV MOD BP:      27.8 ml RIGHT VENTRICLE  TAPSE (M-mode): 1.5 cm LEFT ATRIUM             Index        RIGHT ATRIUM           Index LA diam:        4.00 cm 2.72 cm/m   RA Area:     18.00 cm LA Vol (A2C):   62.6 ml 42.58 ml/m  RA Volume:   44.20 ml  30.07 ml/m LA Vol (A4C):   57.8 ml 39.32 ml/m LA Biplane Vol: 61.1 ml 41.56 ml/m  AORTIC VALVE LVOT Vmax:   80.97 cm/s LVOT Vmean:  48.967 cm/s LVOT VTI:    0.111 m  AORTA Ao Root diam: 3.20 cm Ao Asc diam:  3.60 cm MR Peak grad:    77.4 mmHg    TRICUSPID VALVE MR Mean grad:    49.0 mmHg    TR Peak grad:   20.8 mmHg MR Vmax:         440.00 cm/s  TR Vmax:        228.00 cm/s MR Vmean:        333.0 cm/s MR PISA:         0.57 cm     SHUNTS MR PISA Eff ROA: 5 mm        Systemic VTI:  0.11 m MR PISA Radius:  0.30 cm      Systemic Diam: 2.00 cm Eleonore Chiquito MD Electronically signed by Eleonore Chiquito MD Signature Date/Time: 01/19/2022/12:57:27 PM    Final    DG Chest 1 View  Result Date: 01/18/2022 CLINICAL DATA:  Fall, possible preop EXAM: CHEST  1 VIEW COMPARISON:  Radiograph 07/26/2014 FINDINGS: Mildly enlarged cardiac silhouette. No focal airspace disease. No pleural effusion. No pneumothorax. No acute osseous abnormality. IMPRESSION: Cardiomegaly.  No focal airspace consolidation. No acute osseous abnormality on single frontal view of the chest. Electronically Signed   By: Maurine Simmering M.D.   On: 01/18/2022 14:28   DG Femur Min 2 Views Right  Result Date: 01/18/2022 CLINICAL DATA:  Recent fall. EXAM: RIGHT FEMUR 2 VIEWS COMPARISON:  None Available. FINDINGS: Acute impacted subcapital right femoral neck fracture. No dislocation.  Degenerative changes of the right knee. The right hip joint space is relatively preserved. Soft tissues are unremarkable. IMPRESSION: 1. Acute impacted subcapital right femoral neck fracture. Electronically Signed   By: Titus Dubin M.D.   On: 01/18/2022 14:26   DG Lumbar Spine Complete  Result Date: 01/18/2022 CLINICAL DATA:  Recent fall. EXAM: LUMBAR SPINE - COMPLETE 4+ VIEW COMPARISON:  Lumbar spine x-rays dated March 31, 2009. FINDINGS: There is no evidence of lumbar spine fracture. Severe asymmetric disc height loss at L1-L2 and L5-S1. Moderate disc height loss at L4-L5. Severe left facet arthropathy at L5-S1. Findings have progressed since 2010. Incompletely visualized right femoral neck fracture. IMPRESSION: 1. No acute osseous abnormality of the lumbar spine. 2. Incompletely visualized right femoral neck fracture. Electronically Signed   By: Titus Dubin M.D.   On: 01/18/2022 14:25   DG Knee Complete 4 Views Right  Result Date: 01/18/2022 CLINICAL DATA:  Recent fall. EXAM: RIGHT KNEE - COMPLETE 4+ VIEW COMPARISON:  None Available. FINDINGS: No acute fracture or dislocation. No significant joint effusion. Tricompartmental degenerative changes with severe lateral compartment joint space narrowing. Osteopenia. Soft tissues are unremarkable. IMPRESSION: 1. No acute osseous abnormality. 2. Tricompartmental degenerative changes, severe in the lateral compartment. Electronically Signed   By: Orville Govern.D.  On: 01/18/2022 14:22   DG Hip Unilat With Pelvis 2-3 Views Right  Result Date: 01/18/2022 CLINICAL DATA:  Pain.  Status post fall. EXAM: DG HIP (WITH OR WITHOUT PELVIS) 2-3V RIGHT COMPARISON:  None Available. FINDINGS: There is an acute, impacted, subcapital right femoral neck fracture. No dislocation. No additional fractures identified. Lumbar scoliosis and degenerative disc disease noted. IMPRESSION: Acute, impacted, subcapital right femoral neck fracture. Electronically Signed   By:  Kerby Moors M.D.   On: 01/18/2022 14:16    Cardiac Studies   See echo above  Assessment   Principal Problem:   Hip fracture (HCC) Active Problems:   Longstanding persistent atrial fibrillation (HCC)   Rheumatoid arthritis (HCC)   Chronic systolic CHF (congestive heart failure) (HCC)   Hyperthyroidism   Malignant neoplasm of glottis (HCC)   Dehydration   Elevated troponin   Hypokalemia   LBBB (left bundle branch block)   Plan   She did well with surgery. Afib not well-controlled. Echo shows worsening cardiomyopathy - BP would not tolerate CCB and this is not ideal in CHF anyhow. Stop diltiazem, switch to IV Amiodarone. Eliquis has been restarted by orthopedics, however, given the interruption, will likely need TEE/DCCV in the next 1-2 days. Has longstanding persistent afib - not clear if permanent or not, so would be reasonable to trial AAD and DCCV. Will try to arrange. Will titrate GDMT for HF as bp allows. Stop IVF's - she appears euvolemic. Strict I's/O's and daily weights.  Time Spent Directly with Patient:  I have spent a total of 35 minutes with the patient reviewing hospital notes, telemetry, EKGs, labs and examining the patient as well as establishing an assessment and plan that was discussed personally with the patient.  > 50% of time was spent in direct patient care.  Length of Stay:  LOS: 2 days   Pixie Casino, MD, [facility], [place] Director of the [facility] Diplomate of the American Board of Clinical Lipidology Attending Cardiologist  Direct Dial: [phone]  Fax: [phone]  Website:  [email or web address] [place] Free 01/20/2022, 9:29 AM

## 2022-01-20 NOTE — Progress Notes (Signed)
Subjective: 1 Day Post-Op s/p Procedure(s): ARTHROPLASTY BIPOLAR HIP (HEMIARTHROPLASTY)   Patient is alert, oriented. Reports pain as well controlled this morning though did have some severe pain overnight with movement at the hip. Denies chest pain, SOB, Calf pain. No nausea/vomiting. No other complaints.  Objective:  PE: VITALS:   Vitals:   01/20/22 0300 01/20/22 0400 01/20/22 0420 01/20/22 0500  BP: (!) 91/59 98/67  (!) 87/53  Pulse: 72 67  67  Resp: '14 14  13  '$ Temp:   (!) 97.5 F (36.4 C)   TempSrc:   Oral   SpO2: 93% 94%  95%  Weight:      Height:       General: sitting up in bed, in no acute distress Resp: normal respiratory effort MSK: RLE- knee non tender, no effusion. Surgical dressings with scant drainage seen on mepilex. No significant ecchymosis. Dorsiflexion and plantarflexion intact. Sensation intact to all aspects of foot. 2+ DP pulse. Leg length equal.    LABS  Results for orders placed or performed during the hospital encounter of 01/18/22 (from the past 24 hour(s))  Resp Panel by RT-PCR (Flu A&B, Covid) Anterior Nasal Swab     Status: None   Collection Time: 01/19/22  9:35 AM   Specimen: Anterior Nasal Swab  Result Value Ref Range   SARS Coronavirus 2 by RT PCR NEGATIVE NEGATIVE   Influenza A by PCR NEGATIVE NEGATIVE   Influenza B by PCR NEGATIVE NEGATIVE  Comprehensive metabolic panel     Status: Abnormal   Collection Time: 01/19/22 10:21 AM  Result Value Ref Range   Sodium 135 135 - 145 mmol/L   Potassium 4.1 3.5 - 5.1 mmol/L   Chloride 101 98 - 111 mmol/L   CO2 24 22 - 32 mmol/L   Glucose, Bld 124 (H) 70 - 99 mg/dL   BUN 15 8 - 23 mg/dL   Creatinine, Ser 0.83 0.44 - 1.00 mg/dL   Calcium 9.0 8.9 - 10.3 mg/dL   Total Protein 7.2 6.5 - 8.1 g/dL   Albumin 3.4 (L) 3.5 - 5.0 g/dL   AST 24 15 - 41 U/L   ALT 24 0 - 44 U/L   Alkaline Phosphatase 103 38 - 126 U/L   Total Bilirubin 0.9 0.3 - 1.2 mg/dL   GFR, Estimated >60 >60 mL/min   Anion gap  10 5 - 15  CBC with Differential/Platelet     Status: Abnormal   Collection Time: 01/19/22 10:21 AM  Result Value Ref Range   WBC 7.4 4.0 - 10.5 K/uL   RBC 3.79 (L) 3.87 - 5.11 MIL/uL   Hemoglobin 12.1 12.0 - 15.0 g/dL   HCT 35.9 (L) 36.0 - 46.0 %   MCV 94.7 80.0 - 100.0 fL   MCH 31.9 26.0 - 34.0 pg   MCHC 33.7 30.0 - 36.0 g/dL   RDW 13.7 11.5 - 15.5 %   Platelets 226 150 - 400 K/uL   nRBC 0.0 0.0 - 0.2 %   Neutrophils Relative % 76 %   Neutro Abs 5.6 1.7 - 7.7 K/uL   Lymphocytes Relative 13 %   Lymphs Abs 0.9 0.7 - 4.0 K/uL   Monocytes Relative 10 %   Monocytes Absolute 0.7 0.1 - 1.0 K/uL   Eosinophils Relative 1 %   Eosinophils Absolute 0.1 0.0 - 0.5 K/uL   Basophils Relative 0 %   Basophils Absolute 0.0 0.0 - 0.1 K/uL   Immature Granulocytes 0 %  Abs Immature Granulocytes 0.02 0.00 - 0.07 K/uL  CBC     Status: Abnormal   Collection Time: 01/20/22  2:51 AM  Result Value Ref Range   WBC 6.3 4.0 - 10.5 K/uL   RBC 3.33 (L) 3.87 - 5.11 MIL/uL   Hemoglobin 10.5 (L) 12.0 - 15.0 g/dL   HCT 32.6 (L) 36.0 - 46.0 %   MCV 97.9 80.0 - 100.0 fL   MCH 31.5 26.0 - 34.0 pg   MCHC 32.2 30.0 - 36.0 g/dL   RDW 13.5 11.5 - 15.5 %   Platelets 199 150 - 400 K/uL   nRBC 0.0 0.0 - 0.2 %  Basic metabolic panel     Status: Abnormal   Collection Time: 01/20/22  2:51 AM  Result Value Ref Range   Sodium 132 (L) 135 - 145 mmol/L   Potassium 4.4 3.5 - 5.1 mmol/L   Chloride 101 98 - 111 mmol/L   CO2 23 22 - 32 mmol/L   Glucose, Bld 168 (H) 70 - 99 mg/dL   BUN 13 8 - 23 mg/dL   Creatinine, Ser 0.83 0.44 - 1.00 mg/dL   Calcium 8.4 (L) 8.9 - 10.3 mg/dL   GFR, Estimated >60 >60 mL/min   Anion gap 8 5 - 15  Magnesium     Status: None   Collection Time: 01/20/22  2:51 AM  Result Value Ref Range   Magnesium 2.2 1.7 - 2.4 mg/dL    Pelvis Portable  Result Date: 01/19/2022 CLINICAL DATA:  Status post right hip hemiarthroplasty EXAM: PORTABLE PELVIS 1-2 VIEWS COMPARISON:  01/18/2022 FINDINGS:  Right hip hemiarthroplasty is now seen. Pelvic ring is intact. Degenerative changes of the lumbar spine are noted. IMPRESSION: Status post right hip hemiarthroplasty. Electronically Signed   By: Inez Catalina M.D.   On: 01/19/2022 19:54   DG Hip Port Unilat With Pelvis 1V Right  Result Date: 01/19/2022 CLINICAL DATA:  Status post right hip replacement EXAM: DG HIP (WITH OR WITHOUT PELVIS) 1V PORT RIGHT COMPARISON:  01/18/2022 FINDINGS: Hip prosthesis is now seen. No acute fracture or dislocation is noted. IMPRESSION: Status post right hip replacement Electronically Signed   By: Inez Catalina M.D.   On: 01/19/2022 19:52   ECHOCARDIOGRAM COMPLETE  Result Date: 01/19/2022    ECHOCARDIOGRAM REPORT   Patient Name:   Rhonda Marshall Date of Exam: 01/19/2022 Medical Rec #:  [id number]        Height:       61.0 in Accession #:    [id number]       Weight:       110.9 lb Date of Birth:  08/19/1950        BSA:          1.470 m Patient Age:    71 years         BP:           127/87 mmHg Patient Gender: F                HR:           114 bpm. Exam Location:  Inpatient Procedure: 2D Echo, Cardiac Doppler and Color Doppler Indications:    Cardiomegaly I51.7  History:        Patient has prior history of Echocardiogram examinations, most                 recent 05/16/2019. Arrythmias:Atrial Fibrillation; Risk  Factors:Hypertension.  Sonographer:    Bernadene Person RDCS Referring Phys: [place]  1. Left ventricular ejection fraction, by estimation, is 30 to 35%. The left ventricle has moderately decreased function. The left ventricle demonstrates global hypokinesis. Left ventricular diastolic function could not be evaluated.  2. Right ventricular systolic function is mildly reduced. The right ventricular size is normal. There is normal pulmonary artery systolic pressure. The estimated right ventricular systolic pressure is 50.9 mmHg.  3. Left atrial size was mildly dilated.  4. The mitral valve  is grossly normal. Mild mitral valve regurgitation. No evidence of mitral stenosis.  5. The aortic valve is tricuspid. There is mild calcification of the aortic valve. There is mild thickening of the aortic valve. Aortic valve regurgitation is not visualized. Aortic valve sclerosis is present, with no evidence of aortic valve stenosis.  6. The inferior vena cava is normal in size with <50% respiratory variability, suggesting right atrial pressure of 8 mmHg. Comparison(s): Changes from prior study are noted. The left ventricular function is worsened. FINDINGS  Left Ventricle: Left ventricular ejection fraction, by estimation, is 30 to 35%. The left ventricle has moderately decreased function. The left ventricle demonstrates global hypokinesis. The left ventricular internal cavity size was normal in size. There is no left ventricular hypertrophy. Abnormal (paradoxical) septal motion, consistent with left bundle branch block. Left ventricular diastolic function could not be evaluated due to atrial fibrillation. Left ventricular diastolic function could not  be evaluated. Right Ventricle: The right ventricular size is normal. No increase in right ventricular wall thickness. Right ventricular systolic function is mildly reduced. There is normal pulmonary artery systolic pressure. The tricuspid regurgitant velocity is 2.28 m/s, and with an assumed right atrial pressure of 8 mmHg, the estimated right ventricular systolic pressure is 32.6 mmHg. Left Atrium: Left atrial size was mildly dilated. Right Atrium: Right atrial size was normal in size. Pericardium: There is no evidence of pericardial effusion. Mitral Valve: The mitral valve is grossly normal. Mild mitral valve regurgitation. No evidence of mitral valve stenosis. Tricuspid Valve: The tricuspid valve is grossly normal. Tricuspid valve regurgitation is trivial. No evidence of tricuspid stenosis. Aortic Valve: The aortic valve is tricuspid. There is mild calcification  of the aortic valve. There is mild thickening of the aortic valve. Aortic valve regurgitation is not visualized. Aortic valve sclerosis is present, with no evidence of aortic valve stenosis. Pulmonic Valve: The pulmonic valve was grossly normal. Pulmonic valve regurgitation is mild. No evidence of pulmonic stenosis. Aorta: The aortic root and ascending aorta are structurally normal, with no evidence of dilitation. Venous: The inferior vena cava is normal in size with less than 50% respiratory variability, suggesting right atrial pressure of 8 mmHg. IAS/Shunts: The atrial septum is grossly normal.  LEFT VENTRICLE PLAX 2D LVIDd:         4.40 cm LVIDs:         3.60 cm LV PW:         1.00 cm LV IVS:        0.90 cm LVOT diam:     2.00 cm LV SV:         35 LV SV Index:   24 LVOT Area:     3.14 cm  LV Volumes (MOD) LV vol d, MOD A2C: 64.5 ml LV vol d, MOD A4C: 82.5 ml LV vol s, MOD A2C: 44.5 ml LV vol s, MOD A4C: 46.8 ml LV SV MOD A2C:     20.0 ml LV  SV MOD A4C:     82.5 ml LV SV MOD BP:      27.8 ml RIGHT VENTRICLE TAPSE (M-mode): 1.5 cm LEFT ATRIUM             Index        RIGHT ATRIUM           Index LA diam:        4.00 cm 2.72 cm/m   RA Area:     18.00 cm LA Vol (A2C):   62.6 ml 42.58 ml/m  RA Volume:   44.20 ml  30.07 ml/m LA Vol (A4C):   57.8 ml 39.32 ml/m LA Biplane Vol: 61.1 ml 41.56 ml/m  AORTIC VALVE LVOT Vmax:   80.97 cm/s LVOT Vmean:  48.967 cm/s LVOT VTI:    0.111 m  AORTA Ao Root diam: 3.20 cm Ao Asc diam:  3.60 cm MR Peak grad:    77.4 mmHg    TRICUSPID VALVE MR Mean grad:    49.0 mmHg    TR Peak grad:   20.8 mmHg MR Vmax:         440.00 cm/s  TR Vmax:        228.00 cm/s MR Vmean:        333.0 cm/s MR PISA:         0.57 cm     SHUNTS MR PISA Eff ROA: 5 mm        Systemic VTI:  0.11 m MR PISA Radius:  0.30 cm      Systemic Diam: 2.00 cm Eleonore Chiquito MD Electronically signed by Eleonore Chiquito MD Signature Date/Time: 01/19/2022/12:57:27 PM    Final    DG Chest 1 View  Result Date: 01/18/2022 CLINICAL  DATA:  Fall, possible preop EXAM: CHEST  1 VIEW COMPARISON:  Radiograph 07/26/2014 FINDINGS: Mildly enlarged cardiac silhouette. No focal airspace disease. No pleural effusion. No pneumothorax. No acute osseous abnormality. IMPRESSION: Cardiomegaly.  No focal airspace consolidation. No acute osseous abnormality on single frontal view of the chest. Electronically Signed   By: Maurine Simmering M.D.   On: 01/18/2022 14:28   DG Femur Min 2 Views Right  Result Date: 01/18/2022 CLINICAL DATA:  Recent fall. EXAM: RIGHT FEMUR 2 VIEWS COMPARISON:  None Available. FINDINGS: Acute impacted subcapital right femoral neck fracture. No dislocation. Degenerative changes of the right knee. The right hip joint space is relatively preserved. Soft tissues are unremarkable. IMPRESSION: 1. Acute impacted subcapital right femoral neck fracture. Electronically Signed   By: Titus Dubin M.D.   On: 01/18/2022 14:26   DG Lumbar Spine Complete  Result Date: 01/18/2022 CLINICAL DATA:  Recent fall. EXAM: LUMBAR SPINE - COMPLETE 4+ VIEW COMPARISON:  Lumbar spine x-rays dated March 31, 2009. FINDINGS: There is no evidence of lumbar spine fracture. Severe asymmetric disc height loss at L1-L2 and L5-S1. Moderate disc height loss at L4-L5. Severe left facet arthropathy at L5-S1. Findings have progressed since 2010. Incompletely visualized right femoral neck fracture. IMPRESSION: 1. No acute osseous abnormality of the lumbar spine. 2. Incompletely visualized right femoral neck fracture. Electronically Signed   By: Titus Dubin M.D.   On: 01/18/2022 14:25   DG Knee Complete 4 Views Right  Result Date: 01/18/2022 CLINICAL DATA:  Recent fall. EXAM: RIGHT KNEE - COMPLETE 4+ VIEW COMPARISON:  None Available. FINDINGS: No acute fracture or dislocation. No significant joint effusion. Tricompartmental degenerative changes with severe lateral compartment joint space narrowing. Osteopenia. Soft tissues are unremarkable. IMPRESSION: 1. No acute  osseous abnormality. 2. Tricompartmental degenerative changes, severe in the lateral compartment. Electronically Signed   By: Titus Dubin M.D.   On: 01/18/2022 14:22   DG Hip Unilat With Pelvis 2-3 Views Right  Result Date: 01/18/2022 CLINICAL DATA:  Pain.  Status post fall. EXAM: DG HIP (WITH OR WITHOUT PELVIS) 2-3V RIGHT COMPARISON:  None Available. FINDINGS: There is an acute, impacted, subcapital right femoral neck fracture. No dislocation. No additional fractures identified. Lumbar scoliosis and degenerative disc disease noted. IMPRESSION: Acute, impacted, subcapital right femoral neck fracture. Electronically Signed   By: Kerby Moors M.D.   On: 01/18/2022 14:16    Assessment/Plan: Right femoral neck fracture 1 Day Post-Op s/p Procedure(s): ARTHROPLASTY BIPOLAR HIP (HEMIARTHROPLASTY)  Acute blood loss anemia: Hbg 10.5, will continue to watch Weightbearing: WBAT RLE, up with therapy Insicional and dressing care: Reinforce dressings as needed VTE prophylaxis:  ok to restart home Eliquis from ortho standpoint Pain control: She is on oxycodone 10/325 4 x day a baseline so I will change her prn meds to oxycodone with dilaudid for breakthrough.  Follow - up plan: 2 weeks with Dr. Mardelle Matte Dispo: pending PT/OT evals   Contact information:   Merlene Pulling, PA-C Weekdays 8-5  After hours and holidays please check [email or web address] for group call information for [facility]  Ventura Bruns 01/20/2022, 7:17 AM

## 2022-01-20 NOTE — Evaluation (Signed)
Physical Therapy Evaluation Patient Details Name: Rhonda Marshall MRN: [id number] DOB: [birth year] Today's Date: 01/20/2022  History of Present Illness  71 y.o. female with medical history significant of A-fib on Eliquis  Hypothyroidism bilateral carotid bruit, rheumatoid arthritis, LBBB, systolic CHF EF 27%  Hx of throat cancer.  Presented with right hip pain, Dx of R femoral neck fx, s/p R hip hemiarthroplasty 01/19/22.  Clinical Impression  Pt admitted with above diagnosis. Pt ambulated 15' with RW, distance limited by PT due to HR elevated to 141 while walking. Instructed pt in AAROM exercises for R hip. 24 hour assistance recommended either at ST-SNF or home with HHPT if family can provide needed level of care.  Pt currently with functional limitations due to the deficits listed below (see PT Problem List). Pt will benefit from skilled PT to increase their independence and safety with mobility to allow discharge to the venue listed below.          Recommendations for follow up therapy are one component of a multi-disciplinary discharge planning process, led by the attending physician.  Recommendations may be updated based on patient status, additional functional criteria and insurance authorization.  Follow Up Recommendations Skilled nursing-short term rehab (<3 hours/day) Can patient physically be transported by private vehicle: Yes    Assistance Recommended at Discharge Intermittent Supervision/Assistance  Patient can return home with the following  A little help with walking and/or transfers;A little help with bathing/dressing/bathroom;Assistance with cooking/housework;Assist for transportation;Help with stairs or ramp for entrance    Equipment Recommendations Rolling walker (2 wheels)  Recommendations for Other Services       Functional Status Assessment Patient has had a recent decline in their functional status and demonstrates the ability to make significant improvements in  function in a reasonable and predictable amount of time.     Precautions / Restrictions Restrictions Weight Bearing Restrictions: No RLE Weight Bearing: Weight bearing as tolerated      Mobility  Bed Mobility Overal bed mobility: Needs Assistance Bed Mobility: Supine to Sit     Supine to sit: Mod assist     General bed mobility comments: assist to raise trunk and support RLE OOB    Transfers Overall transfer level: Needs assistance Equipment used: Rolling walker (2 wheels) Transfers: Sit to/from Stand Sit to Stand: Min assist, From elevated surface           General transfer comment: VCs for UE and LE placement for safety, min A to power up    Ambulation/Gait Ambulation/Gait assistance: Min guard Gait Distance (Feet): 15 Feet Assistive device: Rolling walker (2 wheels) Gait Pattern/deviations: Step-to pattern, Decreased step length - right, Decreased step length - left Gait velocity: decr     General Gait Details: VCs sequencing, no loss of balance, distance limited by PT when HR elevated to 141  Stairs            Wheelchair Mobility    Modified Rankin (Stroke Patients Only)       Balance Overall balance assessment: Needs assistance   Sitting balance-Leahy Scale: Good     Standing balance support: Reliant on assistive device for balance, Bilateral upper extremity supported Standing balance-Leahy Scale: Fair                               Pertinent Vitals/Pain Pain Assessment Pain Assessment: Faces Faces Pain Scale: Hurts even more Pain Location: R hip with movement Pain Descriptors / Indicators: Guarding,  Grimacing Pain Intervention(s): Limited activity within patient's tolerance, Monitored during session, Premedicated before session, Repositioned, Ice applied    Home Living Family/patient expects to be discharged to:: Private residence Living Arrangements: Children Available Help at Discharge: Family   Home Access: Stairs  to enter   Technical brewer of Steps: 3   Home Layout: One level Home Equipment: None      Prior Function Prior Level of Function : Independent/Modified Independent             Mobility Comments: independent ADLs Comments: independent     Hand Dominance        Extremity/Trunk Assessment   Upper Extremity Assessment Upper Extremity Assessment: Overall WFL for tasks assessed    Lower Extremity Assessment Lower Extremity Assessment: RLE deficits/detail RLE Deficits / Details: tolerated hip flexion AAROM to ~30* and hip abduction AAROM to ~15* RLE Sensation: WNL RLE Coordination: WNL    Cervical / Trunk Assessment Cervical / Trunk Assessment: Normal  Communication   Communication: No difficulties (very talkative)  Cognition Arousal/Alertness: Awake/alert Behavior During Therapy: WFL for tasks assessed/performed Overall Cognitive Status: No family/caregiver present to determine baseline cognitive functioning                                 General Comments: alert and oriented x 4, talks continuously and at times is tangential in what she's saying, did not stop talking when I attempted to provide instructions        General Comments      Exercises General Exercises - Lower Extremity Ankle Circles/Pumps: AROM, Both, 15 reps, Supine Heel Slides: AAROM, Right, 15 reps, Supine Hip ABduction/ADduction: AAROM, Right, 15 reps, Supine   Assessment/Plan    PT Assessment Patient needs continued PT services  PT Problem List Decreased activity tolerance;Decreased mobility;Decreased balance;Pain       PT Treatment Interventions Therapeutic activities;Therapeutic exercise;Gait training;Patient/family education;Functional mobility training    PT Goals (Current goals can be found in the Care Plan section)  Acute Rehab PT Goals Patient Stated Goal: to be able to walk PT Goal Formulation: With patient Time For Goal Achievement: 02/03/22 Potential to  Achieve Goals: Good    Frequency Min 6X/week     Co-evaluation               AM-PAC PT "6 Clicks" Mobility  Outcome Measure Help needed turning from your back to your side while in a flat bed without using bedrails?: A Little Help needed moving from lying on your back to sitting on the side of a flat bed without using bedrails?: A Lot Help needed moving to and from a bed to a chair (including a wheelchair)?: A Little Help needed standing up from a chair using your arms (e.g., wheelchair or bedside chair)?: A Little Help needed to walk in hospital room?: A Little Help needed climbing 3-5 steps with a railing? : A Lot 6 Click Score: 16    End of Session Equipment Utilized During Treatment: Gait belt Activity Tolerance: Patient tolerated treatment well;Treatment limited secondary to medical complications (Comment) (HR 141 walking) Patient left: in chair;with chair alarm set;with call bell/phone within reach Nurse Communication: Mobility status;Other (comment) (elevated HR with activity) PT Visit Diagnosis: Difficulty in walking, not elsewhere classified (R26.2);Pain;Muscle weakness (generalized) (M62.81) Pain - Right/Left: Right Pain - part of body: Hip    Time: 1062-6948 PT Time Calculation (min) (ACUTE ONLY): 18 min   Charges:  PT Evaluation $PT Eval Moderate Complexity: 1 Mod          Philomena Doheny PT 01/20/2022  Acute Rehabilitation Services  Office [phone]

## 2022-01-21 ENCOUNTER — Encounter (HOSPITAL_COMMUNITY): Payer: Self-pay | Admitting: Orthopedic Surgery

## 2022-01-21 ENCOUNTER — Other Ambulatory Visit: Payer: 59

## 2022-01-21 DIAGNOSIS — I4811 Longstanding persistent atrial fibrillation: Secondary | ICD-10-CM | POA: Diagnosis not present

## 2022-01-21 DIAGNOSIS — I5023 Acute on chronic systolic (congestive) heart failure: Secondary | ICD-10-CM | POA: Diagnosis not present

## 2022-01-21 DIAGNOSIS — S72001A Fracture of unspecified part of neck of right femur, initial encounter for closed fracture: Secondary | ICD-10-CM | POA: Diagnosis not present

## 2022-01-21 DIAGNOSIS — M05711 Rheumatoid arthritis with rheumatoid factor of right shoulder without organ or systems involvement: Secondary | ICD-10-CM

## 2022-01-21 DIAGNOSIS — I5022 Chronic systolic (congestive) heart failure: Secondary | ICD-10-CM | POA: Diagnosis not present

## 2022-01-21 DIAGNOSIS — I447 Left bundle-branch block, unspecified: Secondary | ICD-10-CM | POA: Diagnosis not present

## 2022-01-21 LAB — CBC WITH DIFFERENTIAL/PLATELET
Abs Immature Granulocytes: 0.04 10*3/uL (ref 0.00–0.07)
Basophils Absolute: 0 10*3/uL (ref 0.0–0.1)
Basophils Relative: 0 %
Eosinophils Absolute: 0 10*3/uL (ref 0.0–0.5)
Eosinophils Relative: 0 %
HCT: 30.4 % — ABNORMAL LOW (ref 36.0–46.0)
Hemoglobin: 10.5 g/dL — ABNORMAL LOW (ref 12.0–15.0)
Immature Granulocytes: 0 %
Lymphocytes Relative: 11 %
Lymphs Abs: 1.2 10*3/uL (ref 0.7–4.0)
MCH: 32.2 pg (ref 26.0–34.0)
MCHC: 34.5 g/dL (ref 30.0–36.0)
MCV: 93.3 fL (ref 80.0–100.0)
Monocytes Absolute: 1 10*3/uL (ref 0.1–1.0)
Monocytes Relative: 8 %
Neutro Abs: 9.3 10*3/uL — ABNORMAL HIGH (ref 1.7–7.7)
Neutrophils Relative %: 81 %
Platelets: 242 10*3/uL (ref 150–400)
RBC: 3.26 MIL/uL — ABNORMAL LOW (ref 3.87–5.11)
RDW: 13.6 % (ref 11.5–15.5)
WBC: 11.5 10*3/uL — ABNORMAL HIGH (ref 4.0–10.5)
nRBC: 0 % (ref 0.0–0.2)

## 2022-01-21 LAB — COMPREHENSIVE METABOLIC PANEL
ALT: 14 U/L (ref 0–44)
AST: 28 U/L (ref 15–41)
Albumin: 3.1 g/dL — ABNORMAL LOW (ref 3.5–5.0)
Alkaline Phosphatase: 94 U/L (ref 38–126)
Anion gap: 10 (ref 5–15)
BUN: 18 mg/dL (ref 8–23)
CO2: 21 mmol/L — ABNORMAL LOW (ref 22–32)
Calcium: 9.3 mg/dL (ref 8.9–10.3)
Chloride: 100 mmol/L (ref 98–111)
Creatinine, Ser: 1.05 mg/dL — ABNORMAL HIGH (ref 0.44–1.00)
GFR, Estimated: 56 mL/min — ABNORMAL LOW (ref >60)
Glucose, Bld: 120 mg/dL — ABNORMAL HIGH (ref 70–99)
Potassium: 4.3 mmol/L (ref 3.5–5.1)
Sodium: 131 mmol/L — ABNORMAL LOW (ref 135–145)
Total Bilirubin: 0.5 mg/dL (ref 0.3–1.2)
Total Protein: 6.6 g/dL (ref 6.5–8.1)

## 2022-01-21 LAB — MAGNESIUM: Magnesium: 1.9 mg/dL (ref 1.7–2.4)

## 2022-01-21 LAB — PHOSPHORUS: Phosphorus: 3.3 mg/dL (ref 2.5–4.6)

## 2022-01-21 MED ORDER — SACUBITRIL-VALSARTAN 24-26 MG PO TABS
1.0000 | ORAL_TABLET | Freq: Two times a day (BID) | ORAL | Status: DC
  Administered 2022-01-21 – 2022-01-27 (×13): 1 via ORAL
  Filled 2022-01-21 (×13): qty 1

## 2022-01-21 MED ORDER — LIP MEDEX EX OINT
TOPICAL_OINTMENT | CUTANEOUS | Status: DC | PRN
  Filled 2022-01-21: qty 7

## 2022-01-21 NOTE — Progress Notes (Signed)
DAILY PROGRESS NOTE   Patient Name: Rhonda Marshall Date of Encounter: 01/21/2022 Cardiologist: Minus Breeding, MD  Chief Complaint   Less pain  Patient Profile   71 yo female with longstanding persistent afib, LBBB, presented with femur fracture, asked to see for afib and preop  Subjective   On IV amiodarone- afib is generally rate-controlled. On Eliquis. Plan for TEE/DCCV tomorrow at 19.  Objective   Vitals:   01/21/22 0100 01/21/22 0159 01/21/22 0700 01/21/22 0800  BP: 116/82 (!) 138/102 (!) 118/93   Pulse: (!) 107 (!) 113 (!) 110   Resp: 15  (!) 27   Temp:    97.8 F (36.6 C)  TempSrc:    Oral  SpO2: 96%  93%   Weight:      Height:        Intake/Output Summary (Last 24 hours) at 01/21/2022 0936 Last data filed at 01/21/2022 0641 Gross per 24 hour  Intake 789.41 ml  Output 1100 ml  Net -310.59 ml   Filed Weights   01/18/22 2040  Weight: 50.3 kg    Physical Exam   General appearance: alert and no distress Neck: no carotid bruit, no JVD, and thyroid not enlarged, symmetric, no tenderness/mass/nodules Lungs: clear to auscultation bilaterally Heart: irregularly irregular rhythm and tachycardic Abdomen: soft, non-tender; bowel sounds normal; no masses,  no organomegaly Extremities: extremities normal, atraumatic, no cyanosis or edema Pulses: 2+ and symmetric Skin: Skin color, texture, turgor normal. No rashes or lesions Neurologic: Grossly normal Psych: Pleasant  Inpatient Medications    Scheduled Meds:  apixaban  5 mg Oral BID   Chlorhexidine Gluconate Cloth  6 each Topical Daily   docusate sodium  100 mg Oral BID   ferrous sulfate  325 mg Oral TID PC   metoprolol tartrate  50 mg Oral Q6H   propylthiouracil  50 mg Oral TID   senna  1 tablet Oral BID    Continuous Infusions:  amiodarone 30 mg/hr (01/21/22 0641)   methocarbamol (ROBAXIN) IV Stopped (01/19/22 2136)   sodium chloride      PRN Meds: acetaminophen, alum & mag hydroxide-simeth,  bisacodyl, haloperidol lactate, HYDROmorphone (DILAUDID) injection, LORazepam, menthol-cetylpyridinium **OR** phenol, methocarbamol **OR** methocarbamol (ROBAXIN) IV, ondansetron **OR** ondansetron (ZOFRAN) IV, mouth rinse, oxyCODONE, polyethylene glycol   Labs   Results for orders placed or performed during the hospital encounter of 01/18/22 (from the past 48 hour(s))  Comprehensive metabolic panel     Status: Abnormal   Collection Time: 01/19/22 10:21 AM  Result Value Ref Range   Sodium 135 135 - 145 mmol/L   Potassium 4.1 3.5 - 5.1 mmol/L    Comment: DELTA CHECK NOTED NO VISIBLE HEMOLYSIS    Chloride 101 98 - 111 mmol/L   CO2 24 22 - 32 mmol/L   Glucose, Bld 124 (H) 70 - 99 mg/dL    Comment: Glucose reference range applies only to samples taken after fasting for at least 8 hours.   BUN 15 8 - 23 mg/dL   Creatinine, Ser 0.83 0.44 - 1.00 mg/dL   Calcium 9.0 8.9 - 10.3 mg/dL   Total Protein 7.2 6.5 - 8.1 g/dL   Albumin 3.4 (L) 3.5 - 5.0 g/dL   AST 24 15 - 41 U/L   ALT 24 0 - 44 U/L   Alkaline Phosphatase 103 38 - 126 U/L   Total Bilirubin 0.9 0.3 - 1.2 mg/dL   GFR, Estimated >60 >60 mL/min    Comment: (NOTE) Calculated using the CKD-EPI Creatinine  Equation (2021)    Anion gap 10 5 - 15    Comment: Performed at [facility], [place] [address] [place], [place] [address]  CBC with Differential/Platelet     Status: Abnormal   Collection Time: 01/19/22 10:21 AM  Result Value Ref Range   WBC 7.4 4.0 - 10.5 K/uL   RBC 3.79 (L) 3.87 - 5.11 MIL/uL   Hemoglobin 12.1 12.0 - 15.0 g/dL   HCT 35.9 (L) 36.0 - 46.0 %   MCV 94.7 80.0 - 100.0 fL   MCH 31.9 26.0 - 34.0 pg   MCHC 33.7 30.0 - 36.0 g/dL   RDW 13.7 11.5 - 15.5 %   Platelets 226 150 - 400 K/uL   nRBC 0.0 0.0 - 0.2 %   Neutrophils Relative % 76 %   Neutro Abs 5.6 1.7 - 7.7 K/uL   Lymphocytes Relative 13 %   Lymphs Abs 0.9 0.7 - 4.0 K/uL   Monocytes Relative 10 %   Monocytes Absolute 0.7 0.1 - 1.0 K/uL    Eosinophils Relative 1 %   Eosinophils Absolute 0.1 0.0 - 0.5 K/uL   Basophils Relative 0 %   Basophils Absolute 0.0 0.0 - 0.1 K/uL   Immature Granulocytes 0 %   Abs Immature Granulocytes 0.02 0.00 - 0.07 K/uL    Comment: Performed at [facility], [place] [address] [place], [place] [address]  CBC     Status: Abnormal   Collection Time: 01/20/22  2:51 AM  Result Value Ref Range   WBC 6.3 4.0 - 10.5 K/uL   RBC 3.33 (L) 3.87 - 5.11 MIL/uL   Hemoglobin 10.5 (L) 12.0 - 15.0 g/dL   HCT 32.6 (L) 36.0 - 46.0 %   MCV 97.9 80.0 - 100.0 fL   MCH 31.5 26.0 - 34.0 pg   MCHC 32.2 30.0 - 36.0 g/dL   RDW 13.5 11.5 - 15.5 %   Platelets 199 150 - 400 K/uL   nRBC 0.0 0.0 - 0.2 %    Comment: Performed at [facility], [place] [address], [place], [place] [address]  Basic metabolic panel     Status: Abnormal   Collection Time: 01/20/22  2:51 AM  Result Value Ref Range   Sodium 132 (L) 135 - 145 mmol/L   Potassium 4.4 3.5 - 5.1 mmol/L   Chloride 101 98 - 111 mmol/L   CO2 23 22 - 32 mmol/L   Glucose, Bld 168 (H) 70 - 99 mg/dL    Comment: Glucose reference range applies only to samples taken after fasting for at least 8 hours.   BUN 13 8 - 23 mg/dL   Creatinine, Ser 0.83 0.44 - 1.00 mg/dL   Calcium 8.4 (L) 8.9 - 10.3 mg/dL   GFR, Estimated >60 >60 mL/min    Comment: (NOTE) Calculated using the CKD-EPI Creatinine Equation (2021)    Anion gap 8 5 - 15    Comment: Performed at [facility], [place] [address], [place], [place] [address]  Magnesium     Status: None   Collection Time: 01/20/22  2:51 AM  Result Value Ref Range   Magnesium 2.2 1.7 - 2.4 mg/dL    Comment: Performed at [facility], [place] [address], [place], [place] [address]  Comprehensive metabolic panel     Status: Abnormal   Collection Time: 01/21/22  1:40 AM  Result Value Ref Range   Sodium 131 (L) 135 - 145 mmol/L   Potassium  4.3 3.5 - 5.1 mmol/L   Chloride 100  98 - 111 mmol/L   CO2 21 (L) 22 - 32 mmol/L   Glucose, Bld 120 (H) 70 - 99 mg/dL    Comment: Glucose reference range applies only to samples taken after fasting for at least 8 hours.   BUN 18 8 - 23 mg/dL   Creatinine, Ser 1.05 (H) 0.44 - 1.00 mg/dL   Calcium 9.3 8.9 - 10.3 mg/dL   Total Protein 6.6 6.5 - 8.1 g/dL   Albumin 3.1 (L) 3.5 - 5.0 g/dL   AST 28 15 - 41 U/L   ALT 14 0 - 44 U/L   Alkaline Phosphatase 94 38 - 126 U/L   Total Bilirubin 0.5 0.3 - 1.2 mg/dL   GFR, Estimated 56 (L) >60 mL/min    Comment: (NOTE) Calculated using the CKD-EPI Creatinine Equation (2021)    Anion gap 10 5 - 15    Comment: Performed at [facility], [place] [address], [place], [place] [address]  Magnesium     Status: None   Collection Time: 01/21/22  1:40 AM  Result Value Ref Range   Magnesium 1.9 1.7 - 2.4 mg/dL    Comment: Performed at [facility], [place] [address], [place], [place] [address]  Phosphorus     Status: None   Collection Time: 01/21/22  1:40 AM  Result Value Ref Range   Phosphorus 3.3 2.5 - 4.6 mg/dL    Comment: Performed at [facility], [place] [address] [place], [place] [address]  CBC with Differential/Platelet     Status: Abnormal   Collection Time: 01/21/22  1:40 AM  Result Value Ref Range   WBC 11.5 (H) 4.0 - 10.5 K/uL   RBC 3.26 (L) 3.87 - 5.11 MIL/uL   Hemoglobin 10.5 (L) 12.0 - 15.0 g/dL   HCT 30.4 (L) 36.0 - 46.0 %   MCV 93.3 80.0 - 100.0 fL   MCH 32.2 26.0 - 34.0 pg   MCHC 34.5 30.0 - 36.0 g/dL   RDW 13.6 11.5 - 15.5 %   Platelets 242 150 - 400 K/uL   nRBC 0.0 0.0 - 0.2 %   Neutrophils Relative % 81 %   Neutro Abs 9.3 (H) 1.7 - 7.7 K/uL   Lymphocytes Relative 11 %   Lymphs Abs 1.2 0.7 - 4.0 K/uL   Monocytes Relative 8 %   Monocytes Absolute 1.0 0.1 - 1.0 K/uL   Eosinophils Relative 0 %   Eosinophils Absolute 0.0 0.0 - 0.5 K/uL   Basophils Relative 0 %   Basophils Absolute 0.0 0.0 - 0.1 K/uL   Immature  Granulocytes 0 %   Abs Immature Granulocytes 0.04 0.00 - 0.07 K/uL    Comment: Performed at [facility], [place] [address], [place], [place] [address]    ECG   N/A  Telemetry   Afib with RVR - Personally Reviewed  Radiology    Pelvis Portable  Result Date: 01/19/2022 CLINICAL DATA:  Status post right hip hemiarthroplasty EXAM: PORTABLE PELVIS 1-2 VIEWS COMPARISON:  01/18/2022 FINDINGS: Right hip hemiarthroplasty is now seen. Pelvic ring is intact. Degenerative changes of the lumbar spine are noted. IMPRESSION: Status post right hip hemiarthroplasty. Electronically Signed   By: Inez Catalina M.D.   On: 01/19/2022 19:54   DG Hip Port Unilat With Pelvis 1V Right  Result Date: 01/19/2022 CLINICAL DATA:  Status post right hip replacement EXAM: DG HIP (WITH OR WITHOUT PELVIS) 1V PORT  RIGHT COMPARISON:  01/18/2022 FINDINGS: Hip prosthesis is now seen. No acute fracture or dislocation is noted. IMPRESSION: Status post right hip replacement Electronically Signed   By: Inez Catalina M.D.   On: 01/19/2022 19:52    Cardiac Studies   See echo above  Assessment   Principal Problem:   Hip fracture (HCC) Active Problems:   Longstanding persistent atrial fibrillation (HCC)   Rheumatoid arthritis (HCC)   Chronic systolic CHF (congestive heart failure) (HCC)   Hyperthyroidism   Malignant neoplasm of glottis (HCC)   Dehydration   Elevated troponin   Hypokalemia   LBBB (left bundle branch block)   Plan   Rates are reasonably controlled on amiodraone and po metoprolol 50 mg 6hrs. Plan for TEE/DCCV tomorrow- if successful, would consolidate metoprolol to long-acting therapy. Blood pressure is starting to recover. Does not appear overtly volume overloaded. Creatinine is up slightly today. Sodium is low - suspect developing volume overload. Start Entresto 24/26 mg BID for GDMT of heart failure. Check BNP tomorrow.  Time Spent Directly with Patient:  I have spent a total of 35  minutes with the patient reviewing hospital notes, telemetry, EKGs, labs and examining the patient as well as establishing an assessment and plan that was discussed personally with the patient.  > 50% of time was spent in direct patient care.  Length of Stay:  LOS: 3 days   Pixie Casino, MD, [facility], [place] Director of the Advanced Lipid Disorders &  Cardiovascular Risk Reduction Clinic Diplomate of the American Board of Clinical Lipidology Attending Cardiologist  Direct Dial: [phone]  Fax: [phone]  Website:  [email or web address] 01/21/2022, 9:36 AM

## 2022-01-21 NOTE — TOC Progression Note (Addendum)
Transition of Care [facility]) - Progression Note    Patient Details  Name: Rhonda Marshall MRN: [id number] Date of Birth: [birth year]  Transition of [facility]) CM/SW Contact  Leeroy Cha, RN Phone Number: 01/21/2022, 7:40 AM  Clinical Narrative:    PLAN: snf placement [place] number: [id number] Fl2 completed and sent out to area snf.  Will need navihealth auth. Bed offers reviewed with patient and sister.  They have chosen [facility] in [place]. Expected Discharge Plan: [place] Barriers to Discharge: Continued Medical Work up  Expected Discharge Plan and Services Expected Discharge Plan: [place]   Discharge Planning Services: CM Consult   Living arrangements for the past 2 months: Single Family Home                                       Social Determinants of Health (SDOH) Interventions    Readmission Risk Interventions     No data to display

## 2022-01-21 NOTE — Progress Notes (Signed)
Subjective: 2 Days Post-Op s/p Procedure(s): ARTHROPLASTY BIPOLAR HIP (HEMIARTHROPLASTY)   Patient is alert, oriented. Reports pain as well controlled this morning, was able to get up and sit in the chair for multiple hours yesterday. She had a bowel movement yesterday. Denies chest pain, SOB, Calf pain. No nausea/vomiting.   Objective:  PE: VITALS:   Vitals:   01/21/22 0100 01/21/22 0159 01/21/22 0700 01/21/22 0800  BP: 116/82 (!) 138/102 (!) 118/93 (!) 131/95  Pulse: (!) 107 (!) 113 (!) 110 (!) 108  Resp: 15  (!) 27 (!) 22  Temp:    97.8 F (36.6 C)  TempSrc:    Oral  SpO2: 96%  93% 93%  Weight:      Height:       General: sitting up in bed, in no acute distress Resp: normal respiratory effort MSK: RLE- knee non tender, no effusion. Surgical dressings with scant drainage seen on mepilex. No significant ecchymosis. Dorsiflexion and plantarflexion intact. Sensation intact to all aspects of foot. 2+ DP pulse. Leg length equal.    LABS  Results for orders placed or performed during the hospital encounter of 01/18/22 (from the past 24 hour(s))  Comprehensive metabolic panel     Status: Abnormal   Collection Time: 01/21/22  1:40 AM  Result Value Ref Range   Sodium 131 (L) 135 - 145 mmol/L   Potassium 4.3 3.5 - 5.1 mmol/L   Chloride 100 98 - 111 mmol/L   CO2 21 (L) 22 - 32 mmol/L   Glucose, Bld 120 (H) 70 - 99 mg/dL   BUN 18 8 - 23 mg/dL   Creatinine, Ser 1.05 (H) 0.44 - 1.00 mg/dL   Calcium 9.3 8.9 - 10.3 mg/dL   Total Protein 6.6 6.5 - 8.1 g/dL   Albumin 3.1 (L) 3.5 - 5.0 g/dL   AST 28 15 - 41 U/L   ALT 14 0 - 44 U/L   Alkaline Phosphatase 94 38 - 126 U/L   Total Bilirubin 0.5 0.3 - 1.2 mg/dL   GFR, Estimated 56 (L) >60 mL/min   Anion gap 10 5 - 15  Magnesium     Status: None   Collection Time: 01/21/22  1:40 AM  Result Value Ref Range   Magnesium 1.9 1.7 - 2.4 mg/dL  Phosphorus     Status: None   Collection Time: 01/21/22  1:40 AM  Result Value Ref Range    Phosphorus 3.3 2.5 - 4.6 mg/dL  CBC with Differential/Platelet     Status: Abnormal   Collection Time: 01/21/22  1:40 AM  Result Value Ref Range   WBC 11.5 (H) 4.0 - 10.5 K/uL   RBC 3.26 (L) 3.87 - 5.11 MIL/uL   Hemoglobin 10.5 (L) 12.0 - 15.0 g/dL   HCT 30.4 (L) 36.0 - 46.0 %   MCV 93.3 80.0 - 100.0 fL   MCH 32.2 26.0 - 34.0 pg   MCHC 34.5 30.0 - 36.0 g/dL   RDW 13.6 11.5 - 15.5 %   Platelets 242 150 - 400 K/uL   nRBC 0.0 0.0 - 0.2 %   Neutrophils Relative % 81 %   Neutro Abs 9.3 (H) 1.7 - 7.7 K/uL   Lymphocytes Relative 11 %   Lymphs Abs 1.2 0.7 - 4.0 K/uL   Monocytes Relative 8 %   Monocytes Absolute 1.0 0.1 - 1.0 K/uL   Eosinophils Relative 0 %   Eosinophils Absolute 0.0 0.0 - 0.5 K/uL   Basophils Relative 0 %  Basophils Absolute 0.0 0.0 - 0.1 K/uL   Immature Granulocytes 0 %   Abs Immature Granulocytes 0.04 0.00 - 0.07 K/uL    Pelvis Portable  Result Date: 01/19/2022 CLINICAL DATA:  Status post right hip hemiarthroplasty EXAM: PORTABLE PELVIS 1-2 VIEWS COMPARISON:  01/18/2022 FINDINGS: Right hip hemiarthroplasty is now seen. Pelvic ring is intact. Degenerative changes of the lumbar spine are noted. IMPRESSION: Status post right hip hemiarthroplasty. Electronically Signed   By: Inez Catalina M.D.   On: 01/19/2022 19:54   DG Hip Port Unilat With Pelvis 1V Right  Result Date: 01/19/2022 CLINICAL DATA:  Status post right hip replacement EXAM: DG HIP (WITH OR WITHOUT PELVIS) 1V PORT RIGHT COMPARISON:  01/18/2022 FINDINGS: Hip prosthesis is now seen. No acute fracture or dislocation is noted. IMPRESSION: Status post right hip replacement Electronically Signed   By: Inez Catalina M.D.   On: 01/19/2022 19:52    Assessment/Plan: Right femoral neck fracture 2 Days Post-Op s/p Procedure(s): ARTHROPLASTY BIPOLAR HIP (HEMIARTHROPLASTY)  Acute blood loss anemia: Hbg 10.5 holding steady from yesterday Weightbearing: WBAT RLE, up with therapy Insicional and dressing care: Reinforce  dressings as needed VTE prophylaxis:  home Eliquis restarted 01/20/22 Pain control: prn oxycodone and dilaudid, takes Percocet 10/325 4x a day at baseline mainly for neck pain. Follow - up plan: 2 weeks with Dr. Mardelle Matte Dispo: therapies recommending SNF, TOC on board   Contact information:   Merlene Pulling, PA-C Weekdays 8-5  After hours and holidays please check [email or web address] for group call information for [facility]  Ventura Bruns 01/21/2022, 10:12 AM

## 2022-01-21 NOTE — Progress Notes (Signed)
PROGRESS NOTE    Rhonda Marshall  BLT:[id number] DOB: [birth year] DOA: 01/18/2022 PCP: Timoteo Gaul, FNP     Brief Narrative:  71 year old white female Rheumatoid arthritis ?  Carotid bruit Permanent A-fib RVR on Eliquis CHADVASC >3 followed by Dr. 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  Again patient difficult to obtain answer from because her answers wander from subject.    Assessment & Plan: Covid vaccination;   Principal Problem:   Hip fracture ([place]) Active Problems:   Longstanding persistent atrial fibrillation (HCC)   Rheumatoid arthritis (HCC)   Chronic systolic CHF (congestive heart failure) (HCC)   Hyperthyroidism   Malignant neoplasm of glottis (HCC)   Dehydration   Elevated troponin   Hypokalemia   LBBB (left bundle branch block)  Follow-up with right hip fracture Pain seems somewhat uncontrolled without being medicated Postop weightbearing status, anticoagulation, wound care as per orthopedics  A-fib RVR CHADVASC >3 -Eliquis has been restarted by surgery. - 7/10 Per cardiology Dr. Debara Pickett amiodarone drip -Metoprolol 50 mg QID -7/10 Dr. Debara Pickett cardiology to decide on cardioverting patient later this week -7/10 restart Eliquis -7/11 Entresto 24-26 mg tablet BID -7/11 discussed case with Dr. Debara Pickett cardiology and would like patient to be transported to [facility] in the a.m. for cardioversion.  Patient can return to [facility] following cardioversion  Compensated HFrEF -prior echo 45-50% 05/16/2019 -Strict in and out +1.2 L - Daily weight Filed Weights   01/18/22 2040  Weight: 50.3 kg     Anxiety - Patient normally on Xanax at home  however given her A-fib RVR, and anxiety concerning her hip fracture, poor cardiac function much better for patient's anxiety to be on a longer acting agent.  Will help control A-fib RVR - Hold home Xanax - Ativan 1 to 2 mg q 4hrs PRN - Haldol PRN   Rheumatoid arthritis not on medication -Outpatient follow-up with primary physician  Prior toxic nodular goiter -7/8 TSH= 1.7 -Thyroid function studies done on admission look reasonable -Propylthiouracil 50 3 times daily  Bipolar vs personality disorder? - Given patient's behavior believe she has an underlying undiagnosed psychiatric issue.  Recommend evaluation as outpatient.         Mobility Assessment (last 72 hours)     Mobility Assessment     Row Name 01/20/22 1116           What is the highest level of mobility based on the progressive mobility assessment? Level 4 (Walks with assist in room) - Balance while marching in place and cannot step forward and back - Complete                  Interdisciplinary Goals of Care Family Meeting   Date carried out: 01/21/2022  Location of the meeting:   Member's involved:   Durable Power of Tour manager:     Discussion: We discussed goals of care for [name] .    Code status:   Disposition:   Time spent for the meeting:     Allie Bossier, MD  01/21/2022, 9:06 AM         DVT prophylaxis: Eliquis Code Status: Full  Family Communication:  Status is: Inpatient    Dispo: The patient is from: Home              Anticipated d/c is to: SNF              Anticipated d/c date is: > 3 days              Patient currently is not medically stable to d/c.      Consultants:  Cardiology Orthopedic surgery   Procedures/Significant Events:    I have personally reviewed and interpreted all radiology studies and my findings are as above.  VENTILATOR SETTINGS:    Cultures   Antimicrobials:    Devices   LINES  / TUBES:      Continuous Infusions:  amiodarone 30 mg/hr (01/21/22 0641)   methocarbamol (ROBAXIN) IV Stopped (01/19/22 2136)   sodium chloride       Objective: Vitals:   01/21/22 0035 01/21/22 0100 01/21/22 0159 01/21/22 0700  BP:  116/82 (!) 138/102 (!) 118/93  Pulse:  (!) 107 (!) 113 (!) 110  Resp:  15  (!) 27  Temp: (!) 97.4 F (36.3 C)     TempSrc: Oral     SpO2:  96%  93%  Weight:      Height:        Intake/Output Summary (Last 24 hours) at 01/21/2022 3785 Last data filed at 01/21/2022 0641 Gross per 24 hour  Intake 789.41 ml  Output 1100 ml  Net -310.59 ml    Filed Weights   01/18/22 2040  Weight: 50.3 kg    Examination:  General: A/O x4, No acute respiratory distress Eyes: negative scleral hemorrhage, negative anisocoria, negative icterus ENT: Negative Runny nose, negative gingival bleeding, Neck:  Negative scars, masses, torticollis, lymphadenopathy, JVD Lungs: Clear to auscultation bilaterally without wheezes or crackles Cardiovascular: Regular rate and rhythm without murmur gallop or rub normal S1 and S2 Abdomen: negative abdominal pain, nondistended, positive soft, bowel sounds, no rebound, no ascites, no appreciable mass Extremities: RIGHT hip pain Skin: Negative rashes, lesions, ulcers Psychiatric:  Negative depression, positive anxiety, negative fatigue, negative mania  Central nervous system:  Cranial nerves II through XII intact, tongue/uvula midline, all extremities muscle strength 5/5, sensation intact throughout, negative dysarthria, negative expressive aphasia, negative receptive aphasia.  .     Data Reviewed: Care during the described time interval was provided by me .  I have reviewed this patient's available data, including medical history, events of note, physical examination, and all test results as part of my evaluation.  CBC: Recent Labs  Lab 01/18/22 1421 01/19/22 1021 01/20/22 0251 01/21/22 0140  WBC 9.2 7.4 6.3 11.5*   NEUTROABS 7.5 5.6  --  9.3*  HGB 12.4 12.1 10.5* 10.5*  HCT 36.5 35.9* 32.6* 30.4*  MCV 93.1 94.7 97.9 93.3  PLT 208 226 199 885    Basic Metabolic Panel: Recent Labs  Lab 01/18/22 1421 01/18/22 2216 01/19/22 1021 01/20/22 0251 01/21/22 0140  NA 133* 135 135 132* 131*  K 3.8 3.3* 4.1 4.4 4.3  CL 95* 99 101 101 100  CO2 '24 24 24 23 '$ 21*  GLUCOSE 98 127* 124* 168* 120*  BUN '10 13 15 13 18  '$ CREATININE 0.85 0.88 0.83 0.83 1.05*  CALCIUM 10.2 8.8* 9.0 8.4* 9.3  MG  --  2.3  --  2.2 1.9  PHOS  --  2.5  --   --  3.3    GFR: Estimated Creatinine Clearance: 36.5  mL/min (A) (by C-G formula based on SCr of 1.05 mg/dL (H)). Liver Function Tests: Recent Labs  Lab 01/18/22 2216 01/19/22 1021 01/21/22 0140  AST '25 24 28  '$ ALT '21 24 14  '$ ALKPHOS 101 103 94  BILITOT 1.0 0.9 0.5  PROT 7.2 7.2 6.6  ALBUMIN 3.3* 3.4* 3.1*    No results for input(s): "LIPASE", "AMYLASE" in the last 168 hours. No results for input(s): "AMMONIA" in the last 168 hours. Coagulation Profile: No results for input(s): "INR", "PROTIME" in the last 168 hours. Cardiac Enzymes: Recent Labs  Lab 01/18/22 2216  CKTOTAL 81    BNP (last 3 results) No results for input(s): "PROBNP" in the last 8760 hours. HbA1C: No results for input(s): "HGBA1C" in the last 72 hours. CBG: No results for input(s): "GLUCAP" in the last 168 hours. Lipid Profile: No results for input(s): "CHOL", "HDL", "LDLCALC", "TRIG", "CHOLHDL", "LDLDIRECT" in the last 72 hours. Thyroid Function Tests: Recent Labs    01/18/22 2216  TSH 1.746  FREET4 1.02    Anemia Panel: No results for input(s): "VITAMINB12", "FOLATE", "FERRITIN", "TIBC", "IRON", "RETICCTPCT" in the last 72 hours. Sepsis Labs: No results for input(s): "PROCALCITON", "LATICACIDVEN" in the last 168 hours.  Recent Results (from the past 240 hour(s))  MRSA Next Gen by PCR, Nasal     Status: None   Collection Time: 01/18/22  9:20 PM   Specimen: Nasal Mucosa; Nasal  Swab  Result Value Ref Range Status   MRSA by PCR Next Gen NOT DETECTED NOT DETECTED Final    Comment: (NOTE) The GeneXpert MRSA Assay (FDA approved for NASAL specimens only), is one component of a comprehensive MRSA colonization surveillance program. It is not intended to diagnose MRSA infection nor to guide or monitor treatment for MRSA infections. Test performance is not FDA approved in patients less than 45 years old. Performed at [facility], [place] [address], [place], [place] [address]   Resp Panel by RT-PCR (Flu A&B, Covid) Anterior Nasal Swab     Status: None   Collection Time: 01/19/22  9:35 AM   Specimen: Anterior Nasal Swab  Result Value Ref Range Status   SARS Coronavirus 2 by RT PCR NEGATIVE NEGATIVE Final    Comment: (NOTE) SARS-CoV-2 target nucleic acids are NOT DETECTED.  The SARS-CoV-2 RNA is generally detectable in upper respiratory specimens during the acute phase of infection. The lowest concentration of SARS-CoV-2 viral copies this assay can detect is 138 copies/mL. A negative result does not preclude SARS-Cov-2 infection and should not be used as the sole basis for treatment or other patient management decisions. A negative result may occur with  improper specimen collection/handling, submission of specimen other than nasopharyngeal swab, presence of viral mutation(s) within the areas targeted by this assay, and inadequate number of viral copies(<138 copies/mL). A negative result must be combined with clinical observations, patient history, and epidemiological information. The expected result is Negative.  Fact Sheet for Patients:  EntrepreneurPulse.com.au  Fact Sheet for Healthcare Providers:  IncredibleEmployment.be  This test is no t yet approved or cleared by the [place] FDA and  has been authorized for detection and/or diagnosis of SARS-CoV-2 by FDA under an Emergency Use Authorization  (EUA). This EUA will remain  in effect (meaning this test can be used) for the duration of the COVID-19 declaration under Section 564(b)(1) of the Act, 21 U.S.C.section 360bbb-3(b)(1), unless the authorization is terminated  or revoked sooner.       Influenza A by PCR NEGATIVE  NEGATIVE Final   Influenza B by PCR NEGATIVE NEGATIVE Final    Comment: (NOTE) The Xpert Xpress SARS-CoV-2/FLU/RSV plus assay is intended as an aid in the diagnosis of influenza from Nasopharyngeal swab specimens and should not be used as a sole basis for treatment. Nasal washings and aspirates are unacceptable for Xpert Xpress SARS-CoV-2/FLU/RSV testing.  Fact Sheet for Patients: EntrepreneurPulse.com.au  Fact Sheet for Healthcare Providers: IncredibleEmployment.be  This test is not yet approved or cleared by the [place] FDA and has been authorized for detection and/or diagnosis of SARS-CoV-2 by FDA under an Emergency Use Authorization (EUA). This EUA will remain in effect (meaning this test can be used) for the duration of the COVID-19 declaration under Section 564(b)(1) of the Act, 21 U.S.C. section 360bbb-3(b)(1), unless the authorization is terminated or revoked.  Performed at [facility], [place] [address], [place], [place] [address]          Radiology Studies: Pelvis Portable  Result Date: 01/19/2022 CLINICAL DATA:  Status post right hip hemiarthroplasty EXAM: PORTABLE PELVIS 1-2 VIEWS COMPARISON:  01/18/2022 FINDINGS: Right hip hemiarthroplasty is now seen. Pelvic ring is intact. Degenerative changes of the lumbar spine are noted. IMPRESSION: Status post right hip hemiarthroplasty. Electronically Signed   By: Inez Catalina M.D.   On: 01/19/2022 19:54   DG Hip Port Unilat With Pelvis 1V Right  Result Date: 01/19/2022 CLINICAL DATA:  Status post right hip replacement EXAM: DG HIP (WITH OR WITHOUT PELVIS) 1V PORT RIGHT COMPARISON:   01/18/2022 FINDINGS: Hip prosthesis is now seen. No acute fracture or dislocation is noted. IMPRESSION: Status post right hip replacement Electronically Signed   By: Inez Catalina M.D.   On: 01/19/2022 19:52   ECHOCARDIOGRAM COMPLETE  Result Date: 01/19/2022    ECHOCARDIOGRAM REPORT   Patient Name:   Rhonda Marshall Date of Exam: 01/19/2022 Medical Rec #:  [id number]        Height:       61.0 in Accession #:    [id number]       Weight:       110.9 lb Date of Birth:  Oct 26, 1950        BSA:          1.470 m Patient Age:    76 years         BP:           127/87 mmHg Patient Gender: F                HR:           114 bpm. Exam Location:  Inpatient Procedure: 2D Echo, Cardiac Doppler and Color Doppler Indications:    Cardiomegaly I51.7  History:        Patient has prior history of Echocardiogram examinations, most                 recent 05/16/2019. Arrythmias:Atrial Fibrillation; Risk                 Factors:Hypertension.  Sonographer:    Bernadene Person RDCS Referring Phys: [facility]  1. Left ventricular ejection fraction, by estimation, is 30 to 35%. The left ventricle has moderately decreased function. The left ventricle demonstrates global hypokinesis. Left ventricular diastolic function could not be evaluated.  2. Right ventricular systolic function is mildly reduced. The right ventricular size is normal. There is normal pulmonary artery systolic pressure. The estimated right ventricular systolic pressure is 43.1 mmHg.  3. Left atrial size  was mildly dilated.  4. The mitral valve is grossly normal. Mild mitral valve regurgitation. No evidence of mitral stenosis.  5. The aortic valve is tricuspid. There is mild calcification of the aortic valve. There is mild thickening of the aortic valve. Aortic valve regurgitation is not visualized. Aortic valve sclerosis is present, with no evidence of aortic valve stenosis.  6. The inferior vena cava is normal in size with <50% respiratory variability,  suggesting right atrial pressure of 8 mmHg. Comparison(s): Changes from prior study are noted. The left ventricular function is worsened. FINDINGS  Left Ventricle: Left ventricular ejection fraction, by estimation, is 30 to 35%. The left ventricle has moderately decreased function. The left ventricle demonstrates global hypokinesis. The left ventricular internal cavity size was normal in size. There is no left ventricular hypertrophy. Abnormal (paradoxical) septal motion, consistent with left bundle branch block. Left ventricular diastolic function could not be evaluated due to atrial fibrillation. Left ventricular diastolic function could not  be evaluated. Right Ventricle: The right ventricular size is normal. No increase in right ventricular wall thickness. Right ventricular systolic function is mildly reduced. There is normal pulmonary artery systolic pressure. The tricuspid regurgitant velocity is 2.28 m/s, and with an assumed right atrial pressure of 8 mmHg, the estimated right ventricular systolic pressure is 06.2 mmHg. Left Atrium: Left atrial size was mildly dilated. Right Atrium: Right atrial size was normal in size. Pericardium: There is no evidence of pericardial effusion. Mitral Valve: The mitral valve is grossly normal. Mild mitral valve regurgitation. No evidence of mitral valve stenosis. Tricuspid Valve: The tricuspid valve is grossly normal. Tricuspid valve regurgitation is trivial. No evidence of tricuspid stenosis. Aortic Valve: The aortic valve is tricuspid. There is mild calcification of the aortic valve. There is mild thickening of the aortic valve. Aortic valve regurgitation is not visualized. Aortic valve sclerosis is present, with no evidence of aortic valve stenosis. Pulmonic Valve: The pulmonic valve was grossly normal. Pulmonic valve regurgitation is mild. No evidence of pulmonic stenosis. Aorta: The aortic root and ascending aorta are structurally normal, with no evidence of dilitation.  Venous: The inferior vena cava is normal in size with less than 50% respiratory variability, suggesting right atrial pressure of 8 mmHg. IAS/Shunts: The atrial septum is grossly normal.  LEFT VENTRICLE PLAX 2D LVIDd:         4.40 cm LVIDs:         3.60 cm LV PW:         1.00 cm LV IVS:        0.90 cm LVOT diam:     2.00 cm LV SV:         35 LV SV Index:   24 LVOT Area:     3.14 cm  LV Volumes (MOD) LV vol d, MOD A2C: 64.5 ml LV vol d, MOD A4C: 82.5 ml LV vol s, MOD A2C: 44.5 ml LV vol s, MOD A4C: 46.8 ml LV SV MOD A2C:     20.0 ml LV SV MOD A4C:     82.5 ml LV SV MOD BP:      27.8 ml RIGHT VENTRICLE TAPSE (M-mode): 1.5 cm LEFT ATRIUM             Index        RIGHT ATRIUM           Index LA diam:        4.00 cm 2.72 cm/m   RA Area:     18.00 cm  LA Vol (A2C):   62.6 ml 42.58 ml/m  RA Volume:   44.20 ml  30.07 ml/m LA Vol (A4C):   57.8 ml 39.32 ml/m LA Biplane Vol: 61.1 ml 41.56 ml/m  AORTIC VALVE LVOT Vmax:   80.97 cm/s LVOT Vmean:  48.967 cm/s LVOT VTI:    0.111 m  AORTA Ao Root diam: 3.20 cm Ao Asc diam:  3.60 cm MR Peak grad:    77.4 mmHg    TRICUSPID VALVE MR Mean grad:    49.0 mmHg    TR Peak grad:   20.8 mmHg MR Vmax:         440.00 cm/s  TR Vmax:        228.00 cm/s MR Vmean:        333.0 cm/s MR PISA:         0.57 cm     SHUNTS MR PISA Eff ROA: 5 mm        Systemic VTI:  0.11 m MR PISA Radius:  0.30 cm      Systemic Diam: 2.00 cm Eleonore Chiquito MD Electronically signed by Eleonore Chiquito MD Signature Date/Time: 01/19/2022/12:57:27 PM    Final         Scheduled Meds:  apixaban  5 mg Oral BID   Chlorhexidine Gluconate Cloth  6 each Topical Daily   docusate sodium  100 mg Oral BID   ferrous sulfate  325 mg Oral TID PC   metoprolol tartrate  50 mg Oral Q6H   propylthiouracil  50 mg Oral TID   senna  1 tablet Oral BID   Continuous Infusions:  amiodarone 30 mg/hr (01/21/22 0641)   methocarbamol (ROBAXIN) IV Stopped (01/19/22 2136)   sodium chloride       LOS: 3 days    Time spent:40  min    Dodger Sinning, Geraldo Docker, MD [facility]   If 7PM-7AM, please contact night-coverage 01/21/2022, 9:06 AM

## 2022-01-21 NOTE — NC FL2 (Signed)
[place] MEDICAID FL2 LEVEL OF CARE SCREENING TOOL     IDENTIFICATION  Patient Name: Rhonda Marshall Birthdate: January 22, 1951 Sex: female Admission Date (Current Location): 01/18/2022  [facility] and [place] Number:  Herbalist and Address:  [facility],  [place], [place]      Provider Number: [id number]  Attending Physician Name and Address:  Allie Bossier, MD  Relative Name and Phone Number:       Current Level of Care: Hospital Recommended Level of Care: [place] Prior Approval Number:    Date Approved/Denied:   PASRR Number: [id number]  Discharge Plan: SNF    Current Diagnoses: Patient Active Problem List   Diagnosis Date Noted   Elevated troponin 01/19/2022   Hypokalemia 01/19/2022   LBBB (left bundle branch block) 01/19/2022   Hip fracture ([place]) 01/18/2022   Dehydration 10/10/2019   Malignant neoplasm of glottis ([place]) 09/09/2019   Leg swelling 05/05/2019   Hyperthyroidism 12/01/2016   Dyspnea on exertion 71/12/2692   Chronic systolic CHF (congestive heart failure) ([place]) 07/30/2015   Right knee pain 05/31/2015   Rheumatoid arthritis ([place]) 07/21/2014   Smoker 07/21/2014   Anticoagulation adequate 06/05/2014   Longstanding persistent atrial fibrillation ([place]) 11/14/2010   Reflux 11/14/2010    Orientation RESPIRATION BLADDER Height & Weight     Self, Time, Situation, Place  Normal Continent Weight: 50.3 kg Height:  '5\' 1"'$  (154.9 cm)  BEHAVIORAL SYMPTOMS/MOOD NEUROLOGICAL BOWEL NUTRITION STATUS      Continent Diet (regular)  AMBULATORY STATUS COMMUNICATION OF NEEDS Skin   Extensive Assist Verbally Surgical wounds (rt hip orif)                       Personal Care Assistance Level of Assistance  Bathing, Feeding, Dressing Bathing Assistance: Limited assistance Feeding assistance: Limited assistance Dressing Assistance: Limited assistance     Functional Limitations Info  Sight, Hearing,  Speech Sight Info: Adequate Hearing Info: Adequate Speech Info: Adequate    SPECIAL CARE FACTORS FREQUENCY  PT (By licensed PT), OT (By licensed OT)     PT Frequency: 5 x weekly OT Frequency: 5 x weekly            Contractures Contractures Info: Not present    Additional Factors Info  Code Status Code Status Info: FULL             Current Medications (01/21/2022):  This is the current hospital active medication list Current Facility-Administered Medications  Medication Dose Route Frequency Provider Last Rate Last Admin   acetaminophen (TYLENOL) tablet 325-650 mg  325-650 mg Oral Q6H PRN Marchia Bond, MD       alum & mag hydroxide-simeth (MAALOX/MYLANTA) 200-200-20 MG/5ML suspension 30 mL  30 mL Oral Q4H PRN Marchia Bond, MD       amiodarone (NEXTERONE PREMIX) 360-4.14 MG/200ML-% (1.8 mg/mL) IV infusion  30 mg/hr Intravenous Continuous Pixie Casino, MD 16.67 mL/hr at 01/21/22 0641 30 mg/hr at 01/21/22 0641   apixaban (ELIQUIS) tablet 5 mg  5 mg Oral BID Shade, Haze Justin, RPH   5 mg at 01/20/22 2210   bisacodyl (DULCOLAX) suppository 10 mg  10 mg Rectal Daily PRN Marchia Bond, MD       Chlorhexidine Gluconate Cloth 2 % PADS 6 each  6 each Topical Daily Toy Baker, MD   6 each at 01/20/22 1557   docusate sodium (COLACE) capsule 100 mg  100 mg Oral BID Marchia Bond, MD  ferrous sulfate tablet 325 mg  325 mg Oral TID PC Marchia Bond, MD   325 mg at 01/20/22 1700   haloperidol lactate (HALDOL) injection 1-2 mg  1-2 mg Intramuscular Q6H PRN Kathryne Eriksson, NP       HYDROmorphone (DILAUDID) injection 0.5 mg  0.5 mg Intravenous Q4H PRN Merlene Pulling K, PA-C   0.5 mg at 01/20/22 1210   LORazepam (ATIVAN) tablet 1-2 mg  1-2 mg Oral Q4H PRN Allie Bossier, MD   1 mg at 01/20/22 2210   menthol-cetylpyridinium (CEPACOL) lozenge 3 mg  1 lozenge Oral PRN Marchia Bond, MD       Or   phenol (CHLORASEPTIC) mouth spray 1 spray  1 spray Mouth/Throat PRN Marchia Bond, MD       methocarbamol (ROBAXIN) tablet 500 mg  500 mg Oral Q6H PRN Toy Baker, MD       Or   methocarbamol (ROBAXIN) 500 mg in dextrose 5 % 50 mL IVPB  500 mg Intravenous Q6H PRN Toy Baker, MD   Stopped at 01/19/22 2136   metoprolol tartrate (LOPRESSOR) tablet 50 mg  50 mg Oral Q6H Marchia Bond, MD   50 mg at 01/21/22 0159   ondansetron (ZOFRAN) tablet 4 mg  4 mg Oral Q6H PRN Marchia Bond, MD       Or   ondansetron [facility]) injection 4 mg  4 mg Intravenous Q6H PRN Marchia Bond, MD       Oral care mouth rinse  15 mL Mouth Rinse PRN Marchia Bond, MD       oxyCODONE (Oxy IR/ROXICODONE) immediate release tablet 5-10 mg  5-10 mg Oral Q4H PRN Owens Shark, Blaine K, PA-C   10 mg at 01/20/22 2210   polyethylene glycol (MIRALAX / GLYCOLAX) packet 17 g  17 g Oral Daily PRN Marchia Bond, MD       propylthiouracil (PTU) tablet 50 mg  50 mg Oral TID Marchia Bond, MD   50 mg at 01/20/22 2211   senna (SENOKOT) tablet 8.6 mg  1 tablet Oral BID Marchia Bond, MD       sodium chloride 0.9 % bolus 500 mL  500 mL Intravenous Once Toy Baker, MD         Discharge Medications: Please see discharge summary for a list of discharge medications.  Relevant Imaging Results:  Relevant Lab Results:   Additional Information ssn:[id number]  Leeroy Cha, RN

## 2022-01-22 ENCOUNTER — Inpatient Hospital Stay (HOSPITAL_COMMUNITY): Payer: 59

## 2022-01-22 ENCOUNTER — Inpatient Hospital Stay (HOSPITAL_COMMUNITY): Payer: 59 | Admitting: Certified Registered"

## 2022-01-22 ENCOUNTER — Encounter (HOSPITAL_COMMUNITY): Payer: Self-pay | Admitting: Internal Medicine

## 2022-01-22 ENCOUNTER — Encounter (HOSPITAL_COMMUNITY)
Admission: EM | Disposition: A | Discharge status: Skilled Nursing Facility | Payer: Self-pay | Source: Home / Self Care | Attending: Internal Medicine

## 2022-01-22 DIAGNOSIS — I4891 Unspecified atrial fibrillation: Secondary | ICD-10-CM

## 2022-01-22 DIAGNOSIS — Z87891 Personal history of nicotine dependence: Secondary | ICD-10-CM | POA: Diagnosis not present

## 2022-01-22 DIAGNOSIS — I5023 Acute on chronic systolic (congestive) heart failure: Secondary | ICD-10-CM | POA: Diagnosis not present

## 2022-01-22 DIAGNOSIS — I10 Essential (primary) hypertension: Secondary | ICD-10-CM

## 2022-01-22 DIAGNOSIS — I340 Nonrheumatic mitral (valve) insufficiency: Secondary | ICD-10-CM

## 2022-01-22 DIAGNOSIS — F419 Anxiety disorder, unspecified: Secondary | ICD-10-CM | POA: Diagnosis not present

## 2022-01-22 DIAGNOSIS — I4811 Longstanding persistent atrial fibrillation: Secondary | ICD-10-CM | POA: Diagnosis not present

## 2022-01-22 DIAGNOSIS — S72001A Fracture of unspecified part of neck of right femur, initial encounter for closed fracture: Secondary | ICD-10-CM | POA: Diagnosis not present

## 2022-01-22 DIAGNOSIS — I447 Left bundle-branch block, unspecified: Secondary | ICD-10-CM | POA: Diagnosis not present

## 2022-01-22 HISTORY — PX: CARDIOVERSION: SHX1299

## 2022-01-22 HISTORY — PX: TEE WITHOUT CARDIOVERSION: SHX5443

## 2022-01-22 LAB — CBC WITH DIFFERENTIAL/PLATELET
Abs Immature Granulocytes: 0.06 10*3/uL (ref 0.00–0.07)
Basophils Absolute: 0 10*3/uL (ref 0.0–0.1)
Basophils Relative: 0 %
Eosinophils Absolute: 0 10*3/uL (ref 0.0–0.5)
Eosinophils Relative: 0 %
HCT: 35.8 % — ABNORMAL LOW (ref 36.0–46.0)
Hemoglobin: 12 g/dL (ref 12.0–15.0)
Immature Granulocytes: 1 %
Lymphocytes Relative: 17 %
Lymphs Abs: 1.6 10*3/uL (ref 0.7–4.0)
MCH: 31.9 pg (ref 26.0–34.0)
MCHC: 33.5 g/dL (ref 30.0–36.0)
MCV: 95.2 fL (ref 80.0–100.0)
Monocytes Absolute: 0.9 10*3/uL (ref 0.1–1.0)
Monocytes Relative: 9 %
Neutro Abs: 7.1 10*3/uL (ref 1.7–7.7)
Neutrophils Relative %: 73 %
Platelets: 314 10*3/uL (ref 150–400)
RBC: 3.76 MIL/uL — ABNORMAL LOW (ref 3.87–5.11)
RDW: 14 % (ref 11.5–15.5)
WBC: 9.7 10*3/uL (ref 4.0–10.5)
nRBC: 0 % (ref 0.0–0.2)

## 2022-01-22 LAB — COMPREHENSIVE METABOLIC PANEL
ALT: 13 U/L (ref 0–44)
AST: 24 U/L (ref 15–41)
Albumin: 3.2 g/dL — ABNORMAL LOW (ref 3.5–5.0)
Alkaline Phosphatase: 101 U/L (ref 38–126)
Anion gap: 10 (ref 5–15)
BUN: 15 mg/dL (ref 8–23)
CO2: 24 mmol/L (ref 22–32)
Calcium: 9.4 mg/dL (ref 8.9–10.3)
Chloride: 104 mmol/L (ref 98–111)
Creatinine, Ser: 0.93 mg/dL (ref 0.44–1.00)
GFR, Estimated: >60 mL/min (ref >60)
Glucose, Bld: 103 mg/dL — ABNORMAL HIGH (ref 70–99)
Potassium: 3.6 mmol/L (ref 3.5–5.1)
Sodium: 138 mmol/L (ref 135–145)
Total Bilirubin: 0.7 mg/dL (ref 0.3–1.2)
Total Protein: 6.7 g/dL (ref 6.5–8.1)

## 2022-01-22 LAB — PHOSPHORUS: Phosphorus: 2.5 mg/dL (ref 2.5–4.6)

## 2022-01-22 LAB — ECHO TEE
MV M vel: 4.64 m/s
MV Peak grad: 86.1 mmHg
Radius: 0.5 cm

## 2022-01-22 LAB — MAGNESIUM: Magnesium: 2.1 mg/dL (ref 1.7–2.4)

## 2022-01-22 LAB — BRAIN NATRIURETIC PEPTIDE: B Natriuretic Peptide: 1960.2 pg/mL — ABNORMAL HIGH (ref 0.0–100.0)

## 2022-01-22 SURGERY — ECHOCARDIOGRAM, TRANSESOPHAGEAL
Anesthesia: Monitor Anesthesia Care

## 2022-01-22 MED ORDER — PROPOFOL 10 MG/ML IV BOLUS
INTRAVENOUS | Status: DC | PRN
  Administered 2022-01-22 (×2): 40 mg via INTRAVENOUS

## 2022-01-22 MED ORDER — OXYCODONE HCL 5 MG PO TABS
5.0000 mg | ORAL_TABLET | ORAL | Status: DC | PRN
  Administered 2022-01-24 (×2): 5 mg via ORAL
  Filled 2022-01-22: qty 2
  Filled 2022-01-22 (×2): qty 1

## 2022-01-22 MED ORDER — ALPRAZOLAM 1 MG PO TABS
1.0000 mg | ORAL_TABLET | Freq: Three times a day (TID) | ORAL | Status: DC | PRN
  Administered 2022-01-22 – 2022-01-27 (×10): 1 mg via ORAL
  Filled 2022-01-22 (×10): qty 1

## 2022-01-22 MED ORDER — PHENYLEPHRINE 80 MCG/ML (10ML) SYRINGE FOR IV PUSH (FOR BLOOD PRESSURE SUPPORT)
PREFILLED_SYRINGE | INTRAVENOUS | Status: DC | PRN
  Administered 2022-01-22: 80 ug via INTRAVENOUS

## 2022-01-22 MED ORDER — BUTAMBEN-TETRACAINE-BENZOCAINE 2-2-14 % EX AERO
INHALATION_SPRAY | CUTANEOUS | Status: DC | PRN
  Administered 2022-01-22: 2 via TOPICAL

## 2022-01-22 MED ORDER — SODIUM CHLORIDE 0.9 % IV SOLN: INTRAVENOUS | Status: DC

## 2022-01-22 MED ORDER — METOPROLOL TARTRATE 25 MG PO TABS
50.0000 mg | ORAL_TABLET | Freq: Two times a day (BID) | ORAL | Status: DC
  Administered 2022-01-22: 50 mg via ORAL
  Filled 2022-01-22: qty 2

## 2022-01-22 MED ORDER — OXYCODONE HCL 5 MG PO TABS
10.0000 mg | ORAL_TABLET | ORAL | Status: DC | PRN
  Administered 2022-01-23: 15 mg via ORAL
  Administered 2022-01-24 – 2022-01-27 (×12): 10 mg via ORAL
  Filled 2022-01-22 (×10): qty 2
  Filled 2022-01-22: qty 3
  Filled 2022-01-22: qty 2

## 2022-01-22 MED ORDER — PROPOFOL 500 MG/50ML IV EMUL
INTRAVENOUS | Status: DC | PRN
  Administered 2022-01-22: 125 ug/kg/min via INTRAVENOUS

## 2022-01-22 NOTE — CV Procedure (Signed)
Procedure: Electrical Cardioversion Indications:  Atrial Fibrillation  Procedure Details:  Consent: Risks of procedure as well as the alternatives and risks of each were explained to the (patient/caregiver).  Consent for procedure obtained.  Time Out: Verified patient identification, verified procedure, site/side was marked, verified correct patient position, special equipment/implants available, medications/allergies/relevent history reviewed, required imaging and test results available. PERFORMED.  Patient placed on cardiac monitor, pulse oximetry, supplemental oxygen as necessary.  Sedation given:  Propofol '153mg'$  Pacer pads placed anterior and posterior chest.  Cardioverted 2 time(s).  Cardioversion with synchronized biphasic 150L, 200J shock.  Evaluation: Findings: Post procedure EKG shows: NSR with PACs Complications: None Patient did tolerate procedure well.  Time Spent Directly with the Patient:  37mnutes   HFreada Bergeron7/06/2022, 2:07 PM

## 2022-01-22 NOTE — H&P (View-Only) (Signed)
Progress Note  Patient Name: Rhonda Marshall Date of Encounter: 01/22/2022  [facility] Cardiologist: Minus Breeding, MD   Subjective   No acute overnight events. Patient remains in atrial fibrillation. Rates pretty well controlled. Mostly in the 80s to 90s. She denies any significant palpitations. No chest pain or shortness of breath. She states she did not sleep well. She states she used to be on something that starts with an "a" to help her sleep (assuming Ambien) but she has not been getting this here. I don't see Ambien listed under her PTA medications. She kept perservating over this. She seems to have significant anxiety and has very tangential thought process. I kept having to redirect her.  Inpatient Medications    Scheduled Meds:  apixaban  5 mg Oral BID   Chlorhexidine Gluconate Cloth  6 each Topical Daily   docusate sodium  100 mg Oral BID   ferrous sulfate  325 mg Oral TID PC   metoprolol tartrate  50 mg Oral Q6H   propylthiouracil  50 mg Oral TID   sacubitril-valsartan  1 tablet Oral BID   senna  1 tablet Oral BID   Continuous Infusions:  amiodarone 30 mg/hr (01/21/22 2346)   methocarbamol (ROBAXIN) IV Stopped (01/19/22 2136)   sodium chloride     PRN Meds: acetaminophen, alum & mag hydroxide-simeth, bisacodyl, haloperidol lactate, HYDROmorphone (DILAUDID) injection, lip balm, LORazepam, menthol-cetylpyridinium **OR** phenol, methocarbamol **OR** methocarbamol (ROBAXIN) IV, ondansetron **OR** ondansetron (ZOFRAN) IV, mouth rinse, oxyCODONE, polyethylene glycol   Vital Signs    Vitals:   01/22/22 0201 01/22/22 0300 01/22/22 0400 01/22/22 0500  BP: 107/85 110/64 110/67 101/81  Pulse: (!) 106 94 90 82  Resp:  '17 15 18  '$ Temp:   97.9 F (36.6 C)   TempSrc:   Oral   SpO2:  94% 96% 97%  Weight:      Height:        Intake/Output Summary (Last 24 hours) at 01/22/2022 0722 Last data filed at 01/21/2022 1849 Gross per 24 hour  Intake 295.21 ml  Output 2050 ml   Net -1754.79 ml      01/18/2022    8:40 PM 09/05/2021    3:29 PM 06/14/2021   11:16 AM  Last 3 Weights  Weight (lbs) 110 lb 14.3 oz 118 lb 12.8 oz 124 lb 9.6 oz  Weight (kg) 50.3 kg 53.887 kg 56.518 kg      Telemetry    Atrial fibrillation with rates ranging from the 80s to 110s - Personally Reviewed  ECG    No new ECG tracing today. - Personally Reviewed  Physical Exam   GEN: No acute distress.   Neck: No JVD. Cardiac: Irregular irregular rhythm with normal rate. No murmurs, rubs, or gallops. Radial pulses 2+ and equal bilaterally. Respiratory: No increased work of breathing. Faint crackles noted in left base but otherwise lung clear to ausculation. GI: Soft, non-distended, and non-tender. MS: No lower extremity edema. Skin: Warm and dry. Neuro:  No focal deficits. Psych: Tangential thought process and perseverating over certain topics.  Labs    High Sensitivity Troponin:   Recent Labs  Lab 01/18/22 2216 01/19/22 0048 01/19/22 0249  TROPONINIHS 22* 22* 22*     Chemistry Recent Labs  Lab 01/19/22 1021 01/20/22 0251 01/21/22 0140 01/22/22 0222  NA 135 132* 131* 138  K 4.1 4.4 4.3 3.6  CL 101 101 100 104  CO2 24 23 21* 24  GLUCOSE 124* 168* 120* 103*  BUN 15  $'13 18 15  'X$ CREATININE 0.83 0.83 1.05* 0.93  CALCIUM 9.0 8.4* 9.3 9.4  MG  --  2.2 1.9 2.1  PROT 7.2  --  6.6 6.7  ALBUMIN 3.4*  --  3.1* 3.2*  AST 24  --  28 24  ALT 24  --  14 13  ALKPHOS 103  --  94 101  BILITOT 0.9  --  0.5 0.7  GFRNONAA >60 >60 56* >60  ANIONGAP '10 8 10 10    '$ Lipids No results for input(s): "CHOL", "TRIG", "HDL", "LABVLDL", "LDLCALC", "CHOLHDL" in the last 168 hours.  Hematology Recent Labs  Lab 01/20/22 0251 01/21/22 0140 01/22/22 0222  WBC 6.3 11.5* 9.7  RBC 3.33* 3.26* 3.76*  HGB 10.5* 10.5* 12.0  HCT 32.6* 30.4* 35.8*  MCV 97.9 93.3 95.2  MCH 31.5 32.2 31.9  MCHC 32.2 34.5 33.5  RDW 13.5 13.6 14.0  PLT 199 242 314   Thyroid  Recent Labs  Lab 01/18/22 2216   TSH 1.746  FREET4 1.02    BNP Recent Labs  Lab 01/22/22 0222  BNP 1,960.2*    DDimer No results for input(s): "DDIMER" in the last 168 hours.   Radiology    No results found.  Cardiac Studies   Echocardiogram 01/19/2022: Impressions:  1. Left ventricular ejection fraction, by estimation, is 30 to 35%. The  left ventricle has moderately decreased function. The left ventricle  demonstrates global hypokinesis. Left ventricular diastolic function could  not be evaluated.   2. Right ventricular systolic function is mildly reduced. The right  ventricular size is normal. There is normal pulmonary artery systolic  pressure. The estimated right ventricular systolic pressure is 18.5 mmHg.   3. Left atrial size was mildly dilated.   4. The mitral valve is grossly normal. Mild mitral valve regurgitation.  No evidence of mitral stenosis.   5. The aortic valve is tricuspid. There is mild calcification of the  aortic valve. There is mild thickening of the aortic valve. Aortic valve  regurgitation is not visualized. Aortic valve sclerosis is present, with  no evidence of aortic valve stenosis.   6. The inferior vena cava is normal in size with <50% respiratory  variability, suggesting right atrial pressure of 8 mmHg.   Comparison(s): Changes from prior study are noted. The left ventricular  function is worsened.    Patient Profile     71 y.o. female  with a history of longstanding persistent atrial fibrillation on Eliquis, presumed nonischemic cardiomyopathy with mildly reduced EF 40% 2018 dropped to 30-35% on echo this admission, LBBB, hypertension, subclinical hyperthyroidism, rheumatoid arthritis was admitted on 01/18/2022 for a femur fracture after a fall.  He was consulted for further evaluation of atrial fibrillation with RVR and preop evaluation.  He underwent surgery on 01/19/2022 without any complications.  Assessment & Plan    Longstanding Persistent/ Chronic Atrial  Fibrillation History of longstanding/chronic atrial fibrillation.  Initially presented in RVR in the setting of significant pain right femur fracture.  He was ultimately started on Amiodarone but rates have remained elevated.  Echo showed LVEF of 30-35% with global hypokinesis.  Possibly tachycardia mediated in nature. - Remains in atrial fibrillation. Rates relatively well controlled. Mostly in the 80s to 90s. Occasionally in the 110s. - Continue IV Amiodarone. - Continue Lopressor 50 mg every 6 hours. - Continue chronic anticoagulation with Eliquis 5 mg twice daily. - Plan is for TEE/DCCV today. Of note, patient does have a history of throat cancer and previous  had radiation to her neck. She denies any current dysphagia.  Shared Decision Making/Informed Consent{ The risks [stroke, cardiac arrhythmias rarely resulting in the need for a temporary or permanent pacemaker, skin irritation or burns, esophageal damage, perforation (1:10,000 risk), bleeding, pharyngeal hematoma as well as other potential complications associated with conscious sedation including aspiration, arrhythmia, respiratory failure and death], benefits (treatment guidance, restoration of normal sinus rhythm, diagnostic support) and alternatives of a transesophageal echocardiogram guided cardioversion were discussed in detail with Rhonda Marshall and she is willing to proceed.   Presumed Non-Ischemic Cardiomyopathy History of presumed nonischemic cardiomyopathy.  Per chart review, looks like EF was 40% in 2018 and improved slightly to 45-50% in 2020.  Myoview in 2019 showed no evidence of ischemia. Echo this admission showed LVEF of 30-35% with global hypokinesis, normal RV with mildly reduced systolic function, and mild MR. New drop in EF felt to possibly be due to rapid atrial fibrillation. BNP today elevated at 1,960. - Faint crackles noted in left base but otherwise euvolemic. - Started on Etnresto 24-'26mg'$  twice daily on 01/21/2022.  BP soft this morning with systolic BP in the low 024O; however, improved when I was in the room. Based on last BP, I think we can continue this. She would benefit from the diuretic affect of Entresto. - Continue Lopressor '50mg'$  every 6 hours. Hoping to consolidate to Toprol-XL prior to discharge if cardioversion successful. - Continue to monitor volume status closely for signs of overload. - Will need repeat Echo after restoration of sinus rhythm (or rate control if cardioversion unsuccessful) and optimization of GDMT. If EF still down at that time, may need to consider ischemic evaluation.  Hypertension BP soft at times but stable. - Continue medications for CHF as above.  Otherwise, per primary team. - Right hip fracture - Rheumatoid arthritis - Prior toxic nodular goiter - Anxiety  For questions or updates, please contact [facility] Please consult [email or web address] for contact info under        Signed, Darreld Mclean, PA-C  01/22/2022, 7:22 AM

## 2022-01-22 NOTE — Anesthesia Procedure Notes (Addendum)
Procedure Name: MAC Date/Time: 01/22/2022 1:32 PM  Performed by: Anastasio Auerbach, CRNAPre-anesthesia Checklist: Patient identified, Emergency Drugs available, Suction available and Patient being monitored Patient Re-evaluated:Patient Re-evaluated prior to induction Oxygen Delivery Method: Nasal cannula Airway Equipment and Method: Bite block Dental Injury: Teeth and Oropharynx as per pre-operative assessment

## 2022-01-22 NOTE — Interval H&P Note (Signed)
History and Physical Interval Note:  01/22/2022 1:20 PM  Rhonda Marshall  has presented today for surgery, with the diagnosis of AFIB.  The various methods of treatment have been discussed with the patient and family. After consideration of risks, benefits and other options for treatment, the patient has consented to  Procedure(s): TRANSESOPHAGEAL ECHOCARDIOGRAM (TEE) (N/A) CARDIOVERSION (N/A) as a surgical intervention.  The patient's history has been reviewed, patient examined, no change in status, stable for surgery.  I have reviewed the patient's chart and labs.  Questions were answered to the patient's satisfaction.     Freada Bergeron

## 2022-01-22 NOTE — Progress Notes (Signed)
Patient en route to [facility], picked up by [facility], vital signs stable.

## 2022-01-22 NOTE — Progress Notes (Signed)
Progress Note  Patient Name: Rhonda Marshall Date of Encounter: 01/22/2022  [facility] Cardiologist: Minus Breeding, MD   Subjective   No acute overnight events. Patient remains in atrial fibrillation. Rates pretty well controlled. Mostly in the 80s to 90s. She denies any significant palpitations. No chest pain or shortness of breath. She states she did not sleep well. She states she used to be on something that starts with an "a" to help her sleep (assuming Ambien) but she has not been getting this here. I don't see Ambien listed under her PTA medications. She kept perservating over this. She seems to have significant anxiety and has very tangential thought process. I kept having to redirect her.  Inpatient Medications    Scheduled Meds:  apixaban  5 mg Oral BID   Chlorhexidine Gluconate Cloth  6 each Topical Daily   docusate sodium  100 mg Oral BID   ferrous sulfate  325 mg Oral TID PC   metoprolol tartrate  50 mg Oral Q6H   propylthiouracil  50 mg Oral TID   sacubitril-valsartan  1 tablet Oral BID   senna  1 tablet Oral BID   Continuous Infusions:  amiodarone 30 mg/hr (01/21/22 2346)   methocarbamol (ROBAXIN) IV Stopped (01/19/22 2136)   sodium chloride     PRN Meds: acetaminophen, alum & mag hydroxide-simeth, bisacodyl, haloperidol lactate, HYDROmorphone (DILAUDID) injection, lip balm, LORazepam, menthol-cetylpyridinium **OR** phenol, methocarbamol **OR** methocarbamol (ROBAXIN) IV, ondansetron **OR** ondansetron (ZOFRAN) IV, mouth rinse, oxyCODONE, polyethylene glycol   Vital Signs    Vitals:   01/22/22 0201 01/22/22 0300 01/22/22 0400 01/22/22 0500  BP: 107/85 110/64 110/67 101/81  Pulse: (!) 106 94 90 82  Resp:  '17 15 18  '$ Temp:   97.9 F (36.6 C)   TempSrc:   Oral   SpO2:  94% 96% 97%  Weight:      Height:        Intake/Output Summary (Last 24 hours) at 01/22/2022 0722 Last data filed at 01/21/2022 1849 Gross per 24 hour  Intake 295.21 ml  Output 2050 ml   Net -1754.79 ml      01/18/2022    8:40 PM 09/05/2021    3:29 PM 06/14/2021   11:16 AM  Last 3 Weights  Weight (lbs) 110 lb 14.3 oz 118 lb 12.8 oz 124 lb 9.6 oz  Weight (kg) 50.3 kg 53.887 kg 56.518 kg      Telemetry    Atrial fibrillation with rates ranging from the 80s to 110s - Personally Reviewed  ECG    No new ECG tracing today. - Personally Reviewed  Physical Exam   GEN: No acute distress.   Neck: No JVD. Cardiac: Irregular irregular rhythm with normal rate. No murmurs, rubs, or gallops. Radial pulses 2+ and equal bilaterally. Respiratory: No increased work of breathing. Faint crackles noted in left base but otherwise lung clear to ausculation. GI: Soft, non-distended, and non-tender. MS: No lower extremity edema. Skin: Warm and dry. Neuro:  No focal deficits. Psych: Tangential thought process and perseverating over certain topics.  Labs    High Sensitivity Troponin:   Recent Labs  Lab 01/18/22 2216 01/19/22 0048 01/19/22 0249  TROPONINIHS 22* 22* 22*     Chemistry Recent Labs  Lab 01/19/22 1021 01/20/22 0251 01/21/22 0140 01/22/22 0222  NA 135 132* 131* 138  K 4.1 4.4 4.3 3.6  CL 101 101 100 104  CO2 24 23 21* 24  GLUCOSE 124* 168* 120* 103*  BUN 15  $'13 18 15  'J$ CREATININE 0.83 0.83 1.05* 0.93  CALCIUM 9.0 8.4* 9.3 9.4  MG  --  2.2 1.9 2.1  PROT 7.2  --  6.6 6.7  ALBUMIN 3.4*  --  3.1* 3.2*  AST 24  --  28 24  ALT 24  --  14 13  ALKPHOS 103  --  94 101  BILITOT 0.9  --  0.5 0.7  GFRNONAA >60 >60 56* >60  ANIONGAP '10 8 10 10    '$ Lipids No results for input(s): "CHOL", "TRIG", "HDL", "LABVLDL", "LDLCALC", "CHOLHDL" in the last 168 hours.  Hematology Recent Labs  Lab 01/20/22 0251 01/21/22 0140 01/22/22 0222  WBC 6.3 11.5* 9.7  RBC 3.33* 3.26* 3.76*  HGB 10.5* 10.5* 12.0  HCT 32.6* 30.4* 35.8*  MCV 97.9 93.3 95.2  MCH 31.5 32.2 31.9  MCHC 32.2 34.5 33.5  RDW 13.5 13.6 14.0  PLT 199 242 314   Thyroid  Recent Labs  Lab 01/18/22 2216   TSH 1.746  FREET4 1.02    BNP Recent Labs  Lab 01/22/22 0222  BNP 1,960.2*    DDimer No results for input(s): "DDIMER" in the last 168 hours.   Radiology    No results found.  Cardiac Studies   Echocardiogram 01/19/2022: Impressions:  1. Left ventricular ejection fraction, by estimation, is 30 to 35%. The  left ventricle has moderately decreased function. The left ventricle  demonstrates global hypokinesis. Left ventricular diastolic function could  not be evaluated.   2. Right ventricular systolic function is mildly reduced. The right  ventricular size is normal. There is normal pulmonary artery systolic  pressure. The estimated right ventricular systolic pressure is 02.7 mmHg.   3. Left atrial size was mildly dilated.   4. The mitral valve is grossly normal. Mild mitral valve regurgitation.  No evidence of mitral stenosis.   5. The aortic valve is tricuspid. There is mild calcification of the  aortic valve. There is mild thickening of the aortic valve. Aortic valve  regurgitation is not visualized. Aortic valve sclerosis is present, with  no evidence of aortic valve stenosis.   6. The inferior vena cava is normal in size with <50% respiratory  variability, suggesting right atrial pressure of 8 mmHg.   Comparison(s): Changes from prior study are noted. The left ventricular  function is worsened.    Patient Profile     71 y.o. female  with a history of longstanding persistent atrial fibrillation on Eliquis, presumed nonischemic cardiomyopathy with mildly reduced EF 40% 2018 dropped to 30-35% on echo this admission, LBBB, hypertension, subclinical hyperthyroidism, rheumatoid arthritis was admitted on 01/18/2022 for a femur fracture after a fall.  He was consulted for further evaluation of atrial fibrillation with RVR and preop evaluation.  He underwent surgery on 01/19/2022 without any complications.  Assessment & Plan    Longstanding Persistent/ Chronic Atrial  Fibrillation History of longstanding/chronic atrial fibrillation.  Initially presented in RVR in the setting of significant pain right femur fracture.  He was ultimately started on Amiodarone but rates have remained elevated.  Echo showed LVEF of 30-35% with global hypokinesis.  Possibly tachycardia mediated in nature. - Remains in atrial fibrillation. Rates relatively well controlled. Mostly in the 80s to 90s. Occasionally in the 110s. - Continue IV Amiodarone. - Continue Lopressor 50 mg every 6 hours. - Continue chronic anticoagulation with Eliquis 5 mg twice daily. - Plan is for TEE/DCCV today. Of note, patient does have a history of throat cancer and previous  had radiation to her neck. She denies any current dysphagia.  Shared Decision Making/Informed Consent{ The risks [stroke, cardiac arrhythmias rarely resulting in the need for a temporary or permanent pacemaker, skin irritation or burns, esophageal damage, perforation (1:10,000 risk), bleeding, pharyngeal hematoma as well as other potential complications associated with conscious sedation including aspiration, arrhythmia, respiratory failure and death], benefits (treatment guidance, restoration of normal sinus rhythm, diagnostic support) and alternatives of a transesophageal echocardiogram guided cardioversion were discussed in detail with Ms. Sowles and she is willing to proceed.   Presumed Non-Ischemic Cardiomyopathy History of presumed nonischemic cardiomyopathy.  Per chart review, looks like EF was 40% in 2018 and improved slightly to 45-50% in 2020.  Myoview in 2019 showed no evidence of ischemia. Echo this admission showed LVEF of 30-35% with global hypokinesis, normal RV with mildly reduced systolic function, and mild MR. New drop in EF felt to possibly be due to rapid atrial fibrillation. BNP today elevated at 1,960. - Faint crackles noted in left base but otherwise euvolemic. - Started on Etnresto 24-'26mg'$  twice daily on 01/21/2022.  BP soft this morning with systolic BP in the low 825O; however, improved when I was in the room. Based on last BP, I think we can continue this. She would benefit from the diuretic affect of Entresto. - Continue Lopressor '50mg'$  every 6 hours. Hoping to consolidate to Toprol-XL prior to discharge if cardioversion successful. - Continue to monitor volume status closely for signs of overload. - Will need repeat Echo after restoration of sinus rhythm (or rate control if cardioversion unsuccessful) and optimization of GDMT. If EF still down at that time, may need to consider ischemic evaluation.  Hypertension BP soft at times but stable. - Continue medications for CHF as above.  Otherwise, per primary team. - Right hip fracture - Rheumatoid arthritis - Prior toxic nodular goiter - Anxiety  For questions or updates, please contact [facility] Please consult [email or web address] for contact info under        Signed, Darreld Mclean, PA-C  01/22/2022, 7:22 AM

## 2022-01-22 NOTE — Transfer of Care (Signed)
Immediate Anesthesia Transfer of Care Note  Patient: Rhonda Marshall  Procedure(s) Performed: TRANSESOPHAGEAL ECHOCARDIOGRAM (TEE) CARDIOVERSION  Patient Location: PACU and Endoscopy Unit  Anesthesia Type:General  Level of Consciousness: awake, alert  and oriented  Airway & Oxygen Therapy: Patient Spontanous Breathing  Post-op Assessment: Report given to RN and Post -op Vital signs reviewed and stable  Post vital signs: Reviewed and stable  Last Vitals:  Vitals Value Taken Time  BP 88/64 01/22/22 1410  Temp    Pulse 49 01/22/22 1411  Resp 15 01/22/22 1411  SpO2 96 % 01/22/22 1411  Vitals shown include unvalidated device data.  Last Pain:  Vitals:   01/22/22 1237  TempSrc: Temporal  PainSc: 0-No pain      Patients Stated Pain Goal: 9 (94/[id number])  Complications: No notable events documented.

## 2022-01-22 NOTE — Anesthesia Preprocedure Evaluation (Addendum)
Anesthesia Evaluation  Patient identified by MRN, date of birth, ID band Patient awake    Reviewed: Allergy & Precautions, NPO status , Patient's Chart, lab work & pertinent test results, reviewed documented beta blocker date and time   History of Anesthesia Complications Negative for: history of anesthetic complications  Airway Mallampati: I  TM Distance: >3 FB Neck ROM: Full    Dental  (+) Poor Dentition, Missing, Dental Advisory Given   Pulmonary Patient abstained from smoking., former smoker,  H/o ca glottis: XRT   breath sounds clear to auscultation       Cardiovascular hypertension, Pt. on medications and Pt. on home beta blockers (-) angina+ dysrhythmias Atrial Fibrillation  Rhythm:Irregular Rate:Tachycardia  01/19/2022 ECHO: EF 30-35%. The LV has moderately decreased function, global hypokinesis. There no left ventricular hypertrophy. Abnormal (paradoxical) septal  motion, consistent with left bundle branch block, mild MR   Neuro/Psych Anxiety negative neurological ROS     GI/Hepatic Neg liver ROS, S/p XRT to throat/neck   Endo/Other  negative endocrine ROS  Renal/GU negative Renal ROS     Musculoskeletal  (+) Arthritis , Rheumatoid disorders,    Abdominal   Peds  Hematology eliquis   Anesthesia Other Findings   Reproductive/Obstetrics                            Anesthesia Physical Anesthesia Plan  ASA: 3  Anesthesia Plan: MAC   Post-op Pain Management: Minimal or no pain anticipated   Induction:   PONV Risk Score and Plan: 2 and Treatment may vary due to age or medical condition and Ondansetron  Airway Management Planned: Natural Airway and Nasal Cannula  Additional Equipment: None  Intra-op Plan:   Post-operative Plan:   Informed Consent: I have reviewed the patients History and Physical, chart, labs and discussed the procedure including the risks, benefits and  alternatives for the proposed anesthesia with the patient or authorized representative who has indicated his/her understanding and acceptance.     Dental advisory given  Plan Discussed with: CRNA and Surgeon  Anesthesia Plan Comments:         Anesthesia Quick Evaluation

## 2022-01-22 NOTE — Anesthesia Postprocedure Evaluation (Signed)
Anesthesia Post Note  Patient: Rhonda Marshall  Procedure(s) Performed: TRANSESOPHAGEAL ECHOCARDIOGRAM (TEE) CARDIOVERSION     Patient location during evaluation: Endoscopy Anesthesia Type: MAC Level of consciousness: awake and alert, patient cooperative and oriented Pain management: pain level controlled Vital Signs Assessment: post-procedure vital signs reviewed and stable Respiratory status: nonlabored ventilation, spontaneous breathing and respiratory function stable Cardiovascular status: blood pressure returned to baseline and stable Postop Assessment: no apparent nausea or vomiting Anesthetic complications: no   No notable events documented.  Last Vitals:  Vitals:   01/22/22 1422 01/22/22 1432  BP: 106/62 112/63  Pulse: (!) 50 (!) 52  Resp: 13 17  Temp:    SpO2: 95% 98%    Last Pain:  Vitals:   01/22/22 1432  TempSrc:   PainSc: 0-No pain                 Rhonda Marshall,E. Maelie Chriswell

## 2022-01-22 NOTE — Progress Notes (Signed)
  Echocardiogram Echocardiogram Transesophageal has been performed.  Bobbye Charleston 01/22/2022, 2:22 PM

## 2022-01-22 NOTE — Progress Notes (Signed)
PROGRESS NOTE  Rhonda Marshall  DOB: [birth year]  PCP: Timoteo Gaul, FNP NIO:[id number]  DOA: 01/18/2022  LOS: 4 days  Hospital Day: 5  Brief narrative: Rhonda Marshall is a 71 y.o. female with PMH significant for HTN, permanent A-fib on Eliquis, HTN, chronic systolic CHF (EF 81%), anxiety, rheumatoid arthritis, history of throat cancer. On 7/5, patient was in her garden, she caught her flip-flop on a root and fell causing right hip pain.  The pain persisted for 3 days and hence he presented to the ED on 7/8.   Imaging showed right femoral neck fracture. Admitted to hospitalist service.  Orthopedic consult obtained.. 7/9, underwent hemiarthroplasty.  Subjective: Patient was seen and examined this afternoon.  Pleasant elderly Caucasian female.  Propped up in bed.  Taking her dinner.  Not in distress.  Underwent TEE cardioversion at [facility] today. Chart reviewed Remains afebrile, heart rate in 90s, blood pressure in low normal range, breathing on room air  Assessment and plan: Right femoral neck fracture -7/9, underwent hemiarthroplasty. -Pain control, DVT prophylaxis per orthopedics.  A-fib with RVR History of permanent A-fib  -Was in RVR in the ED and was started on Cardizem drip after which she became hypotensive and hence it was stopped.  Cardiology consultation was obtained.  She was started on oral metoprolol and as well as IV amiodarone. -Underwent TEE cardioversion at [facility] today. -CHADSVASC score more than 3.  Continue anticoagulation with Eliquis twice daily.  Chronic systolic CHF Presumed nonischemic cardiomyopathy Essential hypertension -Echo with EF 30 to 35% with global hypokinesis which is less than 45 to 50% in 2020.  -Remains euvolemic. -CHF regimen: Metoprolol started 50 mg every 6 hours, Entresto 24-26 mg twice daily  Anxiety -PTA on Xanax -Currently on Ativan as needed, Haldol as needed -Resume Xanax.   Rheumatoid arthritis not on medication -Outpatient  follow-up with primary physician   Prior toxic nodular goiter -TSH normal -Continue propylthiouracil 50 milligram 3 times daily  History of throat cancer -s/p prior history of radiation   Bipolar vs personality disorder? - Given patient's behavior believe she has an underlying undiagnosed psychiatric issue.  Recommend evaluation as outpatient.   Goals of care   Code Status: Full Code    Mobility: Encourage ambulation.  SNF recommended by PT Skin assessment:     Nutritional status:  Body mass index is 20.95 kg/m.          Diet:  Diet Order             Diet Heart Room service appropriate? Yes; Fluid consistency: Thin  Diet effective now                   DVT prophylaxis:  SCDs Start: 01/19/22 2140 apixaban (ELIQUIS) tablet 5 mg   Antimicrobials: None Fluid: None Consultants: Cardiology, orthopedics Family Communication: None at bedside  Status is: Inpatient  Continue in-hospital care because: Pending placement Level of care: Progressive   Dispo: The patient is from: Home              Anticipated d/c is to: SNF              Patient currently is not medically stable to d/c.   Difficult to place patient No     Infusions:   amiodarone 30 mg/hr (01/22/22 1330)    Scheduled Meds:  apixaban  5 mg Oral BID   docusate sodium  100 mg Oral BID   ferrous sulfate  325 mg Oral TID PC  metoprolol tartrate  50 mg Oral BID   propylthiouracil  50 mg Oral TID   sacubitril-valsartan  1 tablet Oral BID   senna  1 tablet Oral BID    PRN meds: acetaminophen, ALPRAZolam, alum & mag hydroxide-simeth, bisacodyl, haloperidol lactate, HYDROmorphone (DILAUDID) injection, lip balm, menthol-cetylpyridinium **OR** phenol, ondansetron **OR** ondansetron (ZOFRAN) IV, mouth rinse, oxyCODONE, polyethylene glycol   Antimicrobials: Anti-infectives (From admission, onward)    Start     Dose/Rate Route Frequency Ordered Stop   01/19/22 2330  ceFAZolin (ANCEF) IVPB 2g/100 mL  premix        2 g 200 mL/hr over 30 Minutes Intravenous Every 6 hours 01/19/22 2139 01/20/22 0655   01/19/22 1609  ceFAZolin (ANCEF) 2-4 GM/100ML-% IVPB  Status:  Discontinued       Note to Pharmacy: Key, Kristopher: cabinet override      01/19/22 1609 01/19/22 1624   01/19/22 1603  ceFAZolin (ANCEF) 2-4 GM/100ML-% IVPB       Note to Pharmacy: Hassie Bruce A: cabinet override      01/19/22 1603 01/19/22 1733   01/19/22 0945  ceFAZolin (ANCEF) IVPB 2g/100 mL premix        2 g 200 mL/hr over 30 Minutes Intravenous On call to O.R. 01/19/22 0857 01/19/22 1720       Objective: Vitals:   01/22/22 1526 01/22/22 1600  BP: (!) 140/57   Pulse: (!) 58   Resp: (!) 21   Temp:  99.6 F (37.6 C)  SpO2: 98%     Intake/Output Summary (Last 24 hours) at 01/22/2022 1729 Last data filed at 01/22/2022 1411 Gross per 24 hour  Intake 899.34 ml  Output 1300 ml  Net -400.66 ml   Filed Weights   01/18/22 2040  Weight: 50.3 kg   Weight change:  Body mass index is 20.95 kg/m.   Physical Exam: General exam: Pleasant, elderly Caucasian female.  Not in physical distress Skin: No rashes, lesions or ulcers. HEENT: Atraumatic, normocephalic, no obvious bleeding Lungs: Clear to auscultation bilaterally CVS: Regular rate and rhythm, no murmur GI/Abd soft, nontender, nondistended, bowel sound present CNS: Alert, awake, oriented x3 Psychiatry: Mood appropriate Extremities: No pedal edema, no calf tenderness.  Data Review: I have personally reviewed the laboratory data and studies available.  F/u labs ordered Unresulted Labs (From admission, onward)     Start     Ordered   01/22/22 1239  Protime-INR  ONCE - STAT,   STAT       Question:  Specimen collection method  Answer:  Lab=Lab collect   01/22/22 1238   01/21/22 0500  Comprehensive metabolic panel  Daily at 5am,   R     Question:  Specimen collection method  Answer:  Lab=Lab collect   01/20/22 0824   01/21/22 0500  Magnesium  Daily at  5am,   R     Question:  Specimen collection method  Answer:  Lab=Lab collect   01/20/22 0824   01/21/22 0500  Phosphorus  Daily at 5am,   R     Question:  Specimen collection method  Answer:  Lab=Lab collect   01/20/22 0824   01/21/22 0500  CBC with Differential/Platelet  Daily at 5am,   R     Question:  Specimen collection method  Answer:  Lab=Lab collect   01/20/22 3382            Signed, Terrilee Croak, MD [facility] 01/22/2022

## 2022-01-22 NOTE — CV Procedure (Addendum)
     Transesophageal Echocardiogram Note  Rhonda Marshall [id number] [birth year]  Procedure: Transesophageal Echocardiogram Indications: Afib  Procedure Details Consent: Obtained Time Out: Verified patient identification, verified procedure, site/side was marked, verified correct patient position, special equipment/implants available, Radiology Safety Procedures followed,  medications/allergies/relevent history reviewed, required imaging and test results available.  Performed  Medications: Propofol: '153mg'$   Left Ventrical:  LVEF 30%  Mitral Valve: There is mild prolapse of A2 and P2 scallops of the mitral valve with mild-to-moderate mitral regurgitation  Aortic Valve: Tricuspid, no AI  Tricuspid Valve: Normal structure, trivial TR  Pulmonic Valve: Normal structure, mild-to-moderate PR  Left Atrium/ Left atrial appendage: There is mild smoke in the LAA without thrombus present  Atrial septum: No PFO by color doppler  Aorta: Grade IV plaque at the aortic arch  Following TEE, the patient was successfully DCCV with 150J followed by 200J with return to NSR   Complications: No apparent complications Patient did tolerate procedure well.  Freada Bergeron, MD 01/22/2022, 2:04 PM

## 2022-01-22 NOTE — TOC Progression Note (Addendum)
Transition of Care [facility]) - Progression Note    Patient Details  Name: Rhonda Marshall MRN: [id number] Date of Birth: 1951-07-03  Transition [facility] CM/SW Contact  Leeroy Cha, RN Phone Number: 01/22/2022, 8:23 AM  Clinical Narrative:    Information sent to [employer] for authorization for snf placement Approval for snf stay obtained good from [id number] to [id number].  Expected Discharge Plan: [place] Barriers to Discharge: Continued Medical Work up  Expected Discharge Plan and Services Expected Discharge Plan: [place]   Discharge Planning Services: CM Consult   Living arrangements for the past 2 months: Single Family Home                                       Social Determinants of Health (SDOH) Interventions    Readmission Risk Interventions     No data to display

## 2022-01-22 NOTE — Plan of Care (Signed)
  Problem: Education: Goal: Knowledge of the prescribed therapeutic regimen will improve Outcome: Progressing Goal: Understanding of discharge needs will improve Outcome: Progressing   Problem: Activity: Goal: Ability to tolerate increased activity will improve Outcome: Progressing   Problem: Clinical Measurements: Goal: Postoperative complications will be avoided or minimized Outcome: Progressing   Problem: Pain Management: Goal: Pain level will decrease with appropriate interventions Outcome: Progressing

## 2022-01-23 ENCOUNTER — Encounter (HOSPITAL_COMMUNITY): Payer: Self-pay | Admitting: Cardiology

## 2022-01-23 DIAGNOSIS — S72001A Fracture of unspecified part of neck of right femur, initial encounter for closed fracture: Secondary | ICD-10-CM | POA: Diagnosis not present

## 2022-01-23 DIAGNOSIS — I447 Left bundle-branch block, unspecified: Secondary | ICD-10-CM | POA: Diagnosis not present

## 2022-01-23 DIAGNOSIS — I5023 Acute on chronic systolic (congestive) heart failure: Secondary | ICD-10-CM | POA: Diagnosis not present

## 2022-01-23 DIAGNOSIS — I4811 Longstanding persistent atrial fibrillation: Secondary | ICD-10-CM | POA: Diagnosis not present

## 2022-01-23 LAB — CBC WITH DIFFERENTIAL/PLATELET
Abs Immature Granulocytes: 0.06 10*3/uL (ref 0.00–0.07)
Basophils Absolute: 0 10*3/uL (ref 0.0–0.1)
Basophils Relative: 0 %
Eosinophils Absolute: 0.1 10*3/uL (ref 0.0–0.5)
Eosinophils Relative: 1 %
HCT: 34.1 % — ABNORMAL LOW (ref 36.0–46.0)
Hemoglobin: 11.4 g/dL — ABNORMAL LOW (ref 12.0–15.0)
Immature Granulocytes: 1 %
Lymphocytes Relative: 15 %
Lymphs Abs: 1.3 10*3/uL (ref 0.7–4.0)
MCH: 31.8 pg (ref 26.0–34.0)
MCHC: 33.4 g/dL (ref 30.0–36.0)
MCV: 95.3 fL (ref 80.0–100.0)
Monocytes Absolute: 0.9 10*3/uL (ref 0.1–1.0)
Monocytes Relative: 10 %
Neutro Abs: 6.6 10*3/uL (ref 1.7–7.7)
Neutrophils Relative %: 73 %
Platelets: 322 10*3/uL (ref 150–400)
RBC: 3.58 MIL/uL — ABNORMAL LOW (ref 3.87–5.11)
RDW: 14.2 % (ref 11.5–15.5)
WBC: 9.1 10*3/uL (ref 4.0–10.5)
nRBC: 0 % (ref 0.0–0.2)

## 2022-01-23 LAB — COMPREHENSIVE METABOLIC PANEL
ALT: 14 U/L (ref 0–44)
AST: 25 U/L (ref 15–41)
Albumin: 2.8 g/dL — ABNORMAL LOW (ref 3.5–5.0)
Alkaline Phosphatase: 87 U/L (ref 38–126)
Anion gap: 9 (ref 5–15)
BUN: 13 mg/dL (ref 8–23)
CO2: 25 mmol/L (ref 22–32)
Calcium: 8.7 mg/dL — ABNORMAL LOW (ref 8.9–10.3)
Chloride: 103 mmol/L (ref 98–111)
Creatinine, Ser: 0.82 mg/dL (ref 0.44–1.00)
GFR, Estimated: >60 mL/min (ref >60)
Glucose, Bld: 105 mg/dL — ABNORMAL HIGH (ref 70–99)
Potassium: 3.6 mmol/L (ref 3.5–5.1)
Sodium: 137 mmol/L (ref 135–145)
Total Bilirubin: 0.3 mg/dL (ref 0.3–1.2)
Total Protein: 5.9 g/dL — ABNORMAL LOW (ref 6.5–8.1)

## 2022-01-23 LAB — PHOSPHORUS: Phosphorus: 3.3 mg/dL (ref 2.5–4.6)

## 2022-01-23 LAB — MAGNESIUM: Magnesium: 2 mg/dL (ref 1.7–2.4)

## 2022-01-23 MED ORDER — EMPAGLIFLOZIN 10 MG PO TABS
10.0000 mg | ORAL_TABLET | Freq: Every day | ORAL | Status: DC
  Administered 2022-01-23 – 2022-01-27 (×5): 10 mg via ORAL
  Filled 2022-01-23 (×5): qty 1

## 2022-01-23 MED ORDER — LOPERAMIDE HCL 2 MG PO CAPS
2.0000 mg | ORAL_CAPSULE | ORAL | Status: DC | PRN
  Administered 2022-01-23: 2 mg via ORAL
  Filled 2022-01-23: qty 1

## 2022-01-23 MED ORDER — AMIODARONE HCL 200 MG PO TABS
200.0000 mg | ORAL_TABLET | Freq: Two times a day (BID) | ORAL | Status: DC
  Administered 2022-01-23 – 2022-01-27 (×9): 200 mg via ORAL
  Filled 2022-01-23 (×9): qty 1

## 2022-01-23 MED ORDER — SPIRONOLACTONE 12.5 MG HALF TABLET
12.5000 mg | ORAL_TABLET | Freq: Every day | ORAL | Status: DC
Start: 2022-01-23 — End: 2022-01-23

## 2022-01-23 MED ORDER — SPIRONOLACTONE 25 MG PO TABS
25.0000 mg | ORAL_TABLET | Freq: Every day | ORAL | Status: DC
  Administered 2022-01-23 – 2022-01-27 (×5): 25 mg via ORAL
  Filled 2022-01-23 (×5): qty 1

## 2022-01-23 MED ORDER — METOPROLOL SUCCINATE ER 100 MG PO TB24
100.0000 mg | ORAL_TABLET | Freq: Every day | ORAL | Status: DC
  Administered 2022-01-23 – 2022-01-25 (×3): 100 mg via ORAL
  Filled 2022-01-23: qty 4
  Filled 2022-01-23: qty 1
  Filled 2022-01-23: qty 4

## 2022-01-23 MED ORDER — CHLORHEXIDINE GLUCONATE CLOTH 2 % EX PADS
6.0000 | MEDICATED_PAD | Freq: Every day | CUTANEOUS | Status: DC
  Administered 2022-01-23 – 2022-01-24 (×2): 6 via TOPICAL

## 2022-01-23 NOTE — Progress Notes (Signed)
Pt HR was 48, 50's  and back to 60; continuously unstable when sleeping. NP aware; pt asymptomatic and easily arouse.

## 2022-01-23 NOTE — Progress Notes (Signed)
Physical Therapy Treatment Patient Details Name: Rhonda Marshall MRN: [id number] DOB: 1951/02/08 Today's Date: 01/23/2022   History of Present Illness 71 y.o. female with medical history significant of A-fib on Eliquis  Hypothyroidism bilateral carotid bruit, rheumatoid arthritis, LBBB, systolic CHF EF 35%  Hx of throat cancer.  Presented with right hip pain, Dx of R femoral neck fx, s/p R hip hemiarthroplasty 01/19/22. Onset afib, Cardio version 7/13.    PT Comments    Patient resting in recliner. Min assist to stand and ambulate x 5' to [facility] then to bed. HR 110-152 , very variable. Patient will benefit from PT at [facility].    Recommendations for follow up therapy are one component of a multi-disciplinary discharge planning process, led by the attending physician.  Recommendations may be updated based on patient status, additional functional criteria and insurance authorization.  Follow Up Recommendations  Skilled nursing-short term rehab (<3 hours/day) Can patient physically be transported by private vehicle: Yes   Assistance Recommended at Discharge Intermittent Supervision/Assistance  Patient can return home with the following A little help with walking and/or transfers;A little help with bathing/dressing/bathroom;Assistance with cooking/housework;Assist for transportation;Help with stairs or ramp for entrance   Equipment Recommendations       Recommendations for Other Services       Precautions / Restrictions Precautions Precautions: Fall Precaution Comments: [email or web address]  HR, OK to allow high per RN post cardio version     Mobility  Bed Mobility   Bed Mobility: Sit to Supine       Sit to supine: Min assist   General bed mobility comments: assist with right leg onto bed    Transfers Overall transfer level: Needs assistance Equipment used: Rolling walker (2 wheels) Transfers: Sit to/from Stand Sit to Stand: Min assist, From elevated surface           General transfer  comment: VCs for UE and LE placement for safety, min A to power up    Ambulation/Gait Ambulation/Gait assistance: Min guard Gait Distance (Feet): 5 Feet (x2) Assistive device: Rolling walker (2 wheels) Gait Pattern/deviations: Step-to pattern, Decreased step length - right, Decreased step length - left       General Gait Details: VCs sequencing, no loss of balance, distance limited by PT when HR elevated to 141   Stairs             Wheelchair Mobility    Modified Rankin (Stroke Patients Only)       Balance Overall balance assessment: Needs assistance   Sitting balance-Leahy Scale: Good     Standing balance support: Reliant on assistive device for balance, Bilateral upper extremity supported Standing balance-Leahy Scale: Fair Standing balance comment: able to stand  and perform pericare                            Cognition Arousal/Alertness: Awake/alert Behavior During Therapy: WFL for tasks assessed/performed                                   General Comments: alert and oriented x 4, talks continuously and at times is tangential in what she's saying, did not stop talking when I attempted to provide instructions        Exercises Total Joint Exercises Ankle Circles/Pumps: AROM Heel Slides: AAROM, Right, 10 reps    General Comments        Pertinent Vitals/Pain Pain  Assessment Faces Pain Scale: Hurts little more Pain Location: R hip with movement Pain Descriptors / Indicators: Guarding, Grimacing Pain Intervention(s): Monitored during session    Home Living                          Prior Function            PT Goals (current goals can now be found in the care plan section) Progress towards PT goals: Progressing toward goals    Frequency    Min 3X/week      PT Plan Current plan remains appropriate    Co-evaluation              AM-PAC PT "6 Clicks" Mobility   Outcome Measure  Help needed  turning from your back to your side while in a flat bed without using bedrails?: A Little Help needed moving from lying on your back to sitting on the side of a flat bed without using bedrails?: A Little Help needed moving to and from a bed to a chair (including a wheelchair)?: A Little Help needed standing up from a chair using your arms (e.g., wheelchair or bedside chair)?: A Little Help needed to walk in hospital room?: A Little Help needed climbing 3-5 steps with a railing? : A Lot 6 Click Score: 17    End of Session   Activity Tolerance: Patient tolerated treatment well;Treatment limited secondary to medical complications (Comment) Patient left: in bed;with call bell/phone within reach;with bed alarm set Nurse Communication: Mobility status;Other (comment) PT Visit Diagnosis: Difficulty in walking, not elsewhere classified (R26.2);Pain;Muscle weakness (generalized) (M62.81) Pain - Right/Left: Right Pain - part of body: Hip     Time: 6659-9357 PT Time Calculation (min) (ACUTE ONLY): 30 min  Charges:  $Gait Training: 8-22 mins $Self Care/Home Management: [place] [facility] [phone] Weekend [phone]    Claretha Cooper 01/23/2022, 1:01 PM

## 2022-01-23 NOTE — Progress Notes (Signed)
Subjective: 1 Day Post-Op s/p Procedure(s): ARTHROPLASTY BIPOLAR HIP (HEMIARTHROPLASTY)   Patient is alert, oriented. Reports pain as well controlled this morning, has a loose bowel movement yesterday. Denies chest pain, SOB, Calf pain. No nausea/vomiting.   Objective:  PE: VITALS:   Vitals:   01/23/22 0400 01/23/22 0455 01/23/22 0500 01/23/22 0600  BP: (!) 133/48  (!) 164/68 (!) 143/60  Pulse: (!) 51  64 (!) 52  Resp: '17  18 14  '$ Temp: 98.3 F (36.8 C)     TempSrc: Oral     SpO2: 95%  99% 95%  Weight:  50.2 kg    Height:       General: sitting up in bed, in no acute distress Resp: normal respiratory effort MSK: RLE- knee non tender, no effusion. Surgical dressings with scant drainage seen on mepilex. No significant ecchymosis. Dorsiflexion and plantarflexion intact. Sensation intact to all aspects of foot. 2+ DP pulse. Leg length equal.    LABS  Results for orders placed or performed during the hospital encounter of 01/18/22 (from the past 24 hour(s))  Comprehensive metabolic panel     Status: Abnormal   Collection Time: 01/23/22  3:13 AM  Result Value Ref Range   Sodium 137 135 - 145 mmol/L   Potassium 3.6 3.5 - 5.1 mmol/L   Chloride 103 98 - 111 mmol/L   CO2 25 22 - 32 mmol/L   Glucose, Bld 105 (H) 70 - 99 mg/dL   BUN 13 8 - 23 mg/dL   Creatinine, Ser 0.82 0.44 - 1.00 mg/dL   Calcium 8.7 (L) 8.9 - 10.3 mg/dL   Total Protein 5.9 (L) 6.5 - 8.1 g/dL   Albumin 2.8 (L) 3.5 - 5.0 g/dL   AST 25 15 - 41 U/L   ALT 14 0 - 44 U/L   Alkaline Phosphatase 87 38 - 126 U/L   Total Bilirubin 0.3 0.3 - 1.2 mg/dL   GFR, Estimated >60 >60 mL/min   Anion gap 9 5 - 15  Magnesium     Status: None   Collection Time: 01/23/22  3:13 AM  Result Value Ref Range   Magnesium 2.0 1.7 - 2.4 mg/dL  Phosphorus     Status: None   Collection Time: 01/23/22  3:13 AM  Result Value Ref Range   Phosphorus 3.3 2.5 - 4.6 mg/dL  CBC with Differential/Platelet     Status: Abnormal   Collection  Time: 01/23/22  3:13 AM  Result Value Ref Range   WBC 9.1 4.0 - 10.5 K/uL   RBC 3.58 (L) 3.87 - 5.11 MIL/uL   Hemoglobin 11.4 (L) 12.0 - 15.0 g/dL   HCT 34.1 (L) 36.0 - 46.0 %   MCV 95.3 80.0 - 100.0 fL   MCH 31.8 26.0 - 34.0 pg   MCHC 33.4 30.0 - 36.0 g/dL   RDW 14.2 11.5 - 15.5 %   Platelets 322 150 - 400 K/uL   nRBC 0.0 0.0 - 0.2 %   Neutrophils Relative % 73 %   Neutro Abs 6.6 1.7 - 7.7 K/uL   Lymphocytes Relative 15 %   Lymphs Abs 1.3 0.7 - 4.0 K/uL   Monocytes Relative 10 %   Monocytes Absolute 0.9 0.1 - 1.0 K/uL   Eosinophils Relative 1 %   Eosinophils Absolute 0.1 0.0 - 0.5 K/uL   Basophils Relative 0 %   Basophils Absolute 0.0 0.0 - 0.1 K/uL   Immature Granulocytes 1 %   Abs Immature Granulocytes 0.06  0.00 - 0.07 K/uL    ECHO TEE  Result Date: 01/22/2022    TRANSESOPHOGEAL ECHO REPORT   Patient Name:   Rhonda Marshall Date of Exam: 01/22/2022 Medical Rec #:  [id number]        Height:       61.0 in Accession #:    [id number]       Weight:       110.9 lb Date of Birth:  Dec 04, 1950        BSA:          1.470 m Patient Age:    71 years         BP:           94/83 mmHg Patient Gender: F                HR:           108 bpm. Exam Location:  Inpatient Procedure: 3D Echo, Transesophageal Echo, Cardiac Doppler and Color Doppler Indications:     R94.31 Abnormal EKG; I48.91* Unspeicified atrial fibrillation  History:         Patient has prior history of Echocardiogram examinations, most                  recent 01/19/2022. Abnormal ECG, Arrythmias:Atrial Fibrillation                  and LBBB, Signs/Symptoms:Altered Mental Status, Chest Pain and                  Dyspnea; Risk Factors:Current Smoker.  Sonographer:     Roseanna Rainbow RDCS Referring Phys:  [id number] Greer Ee [place] Diagnosing Phys: Gwyndolyn Kaufman MD PROCEDURE: After discussion of the risks and benefits of a TEE, an informed consent was obtained from the patient. The transesophogeal probe was passed without difficulty through the  esophogus of the patient. Imaged were obtained with the patient in a left lateral decubitus position. Sedation performed by different physician. The patient was monitored while under deep sedation. Anesthestetic sedation was provided intravenously by Anesthesiology: '153mg'$  of Propofol. The patient's vital signs; including heart rate, blood pressure, and oxygen saturation; remained stable throughout the procedure. The patient developed no complications during the procedure. A successful direct current cardioversion was performed at 200 joules with 2 attempts. IMPRESSIONS  1. Left ventricular ejection fraction, by estimation, is 30 to 35%. The left ventricle has moderately decreased function.  2. Right ventricular systolic function is mildly reduced. The right ventricular size is normal.  3. Left atrial size was moderately dilated. There was smoke in the left atrial appendage without evidence of thrombus.  4. Right atrial size was moderately dilated.  5. The mitral valve is abnormal. There is mild thickening and calcification of the mitral valve leaflets. There is mild prolapse of the A2 and P2 scallops of the mitral valve with resultant mild mitral regurgitation.  6. The aortic valve is tricuspid. There is mild calcification of the aortic valve. There is mild thickening of the aortic valve. Aortic valve regurgitation is not visualized.  7. There is grade IV plaque at the level of the aortic arch. Aortic dilatation noted. There is borderline dilatation of the ascending aorta, measuring 36 mm. FINDINGS  Left Ventricle: Left ventricular ejection fraction, by estimation, is 30 to 35%. The left ventricle has moderately decreased function. The left ventricular internal cavity size was normal in size. Right Ventricle: The right ventricular size is normal. No increase in right ventricular wall thickness. Right  ventricular systolic function is mildly reduced. Left Atrium: Left atrial size was moderately dilated. No left  atrial/left atrial appendage thrombus was detected. Right Atrium: Right atrial size was moderately dilated. Pericardium: There is no evidence of pericardial effusion. Mitral Valve: The mitral valve is abnormal. There is mild prolapse of A2 and P2 scallops of the mitral valve. There is mild thickening of the mitral valve leaflet(s). There is mild calcification of the mitral valve leaflet(s). Mild mitral valve regurgitation. Tricuspid Valve: The tricuspid valve is normal in structure. Tricuspid valve regurgitation is trivial. Aortic Valve: The aortic valve is tricuspid. There is mild calcification of the aortic valve. There is mild thickening of the aortic valve. Aortic valve regurgitation is not visualized. Pulmonic Valve: The pulmonic valve was normal in structure. Pulmonic valve regurgitation is mild to moderate. Aorta: There is grade IV plaque at the level of the aortic arch. Aortic dilatation noted. There is borderline dilatation of the ascending aorta, measuring 36 mm. IAS/Shunts: The atrial septum is grossly normal.   AORTA Ao Asc diam: 3.60 cm MR Peak grad:    86.1 mmHg MR Mean grad:    45.0 mmHg MR Vmax:         464.00 cm/s MR Vmean:        308.0 cm/s MR PISA:         1.57 cm MR PISA Eff ROA: 13 mm MR PISA Radius:  0.50 cm Gwyndolyn Kaufman MD Electronically signed by Gwyndolyn Kaufman MD Signature Date/Time: 01/22/2022/4:11:39 PM    Final     Assessment/Plan: Right femoral neck fracture  s/p Procedure(s): ARTHROPLASTY BIPOLAR HIP (HEMIARTHROPLASTY) 01/19/22  Acute blood loss anemia: Hbg 10.5 holding steady from yesterday Weightbearing: WBAT RLE, up with therapy Insicional and dressing care: Reinforce dressings as needed VTE prophylaxis:  home Eliquis restarted 01/20/22 Pain control: prn oxycodone and dilaudid, takes Percocet 10/325 4x a day at baseline mainly for neck pain. Follow - up plan: 2 weeks with Dr. Mardelle Matte Dispo: plan to discharge to SNF when medically cleared, will plan to follow  intermittently.    Contact information:   Merlene Pulling, Hershal Coria [id number] 8-5  After hours and holidays please check [email or web address] for group call information for [facility]  Ventura Bruns 01/23/2022, 7:17 AM

## 2022-01-23 NOTE — TOC Progression Note (Addendum)
Transition of Care [facility]) - Progression Note    Patient Details  Name: Rhonda Marshall MRN: [id number] Date of Birth: 01-13-1951  [facility]) CM/SW Contact  Leeroy Cha, RN Phone Number: 01/23/2022, 2:56 PM  Clinical Narrative:    Tct-tracey harden with [facility] to confirm possible admission on 071423. [facility] is aware of patient and able to take on 071423.  Expected Discharge Plan: [place] Barriers to Discharge: Continued Medical Work up  Expected Discharge Plan and Services Expected Discharge Plan: [place]   Discharge Planning Services: CM Consult   Living arrangements for the past 2 months: Single Family Home                                       Social Determinants of Health (SDOH) Interventions    Readmission Risk Interventions     No data to display

## 2022-01-23 NOTE — Progress Notes (Signed)
PROGRESS NOTE  Rhonda Marshall  DOB: 10-11-1950  PCP: Timoteo Gaul, FNP FFM:[id number]  DOA: 01/18/2022  LOS: 5 days  Hospital Day: 6  Brief narrative: Rhonda Marshall is a 71 y.o. female with PMH significant for HTN, permanent A-fib on Eliquis, HTN, chronic systolic CHF (EF 57%), anxiety, rheumatoid arthritis, history of throat cancer. On 7/5, patient was in her garden, she caught her flip-flop on a root and fell causing right hip pain.  The pain persisted for 3 days and hence he presented to the ED on 7/8.   Imaging showed right femoral neck fracture. Admitted to hospitalist service.  Orthopedic consult obtained.. 7/9, underwent hemiarthroplasty. 7/12, underwent successful DC cardioversion for A-fib  Subjective: Patient was seen and examined this afternoon.  Not in distress.  No new symptoms.  Seen by cardiology this am. Switched from amio drip.  Assessment and plan: Right femoral neck fracture -7/9, underwent hemiarthroplasty. -Pain control, DVT prophylaxis per orthopedics.  A-fib with RVR History of permanent A-fib  -s/p successful TEE/DC cardioversion on 7/12.   -Cardiology follow-up appreciated.  Noted a plan to switch from amiodarone drip to oral amiodarone -  200 mg po BID x 7 days, then taper to 200 mg daily.  Also continue metoprolol succinate at 100 mg daily.   -Continue to monitor on telemetry -CHADSVASC score more than 3.  Continue anticoagulation with Eliquis twice daily.  Chronic systolic CHF Presumed nonischemic cardiomyopathy Essential hypertension -Echo with EF 30 to 35% with global hypokinesis which is less than 45 to 50% in 2020.  -Remains euvolemic. -CHF regimen: Metoprolol succinate 100 mg daily, Entresto 24-26 mg twice daily.  Today, cardiology added Aldactone 25 mg daily and Jardiance 10 mg daily.  Not on Lasix yet.  Anxiety -Currently on Xanax, Ativan as needed, Haldol as needed   Rheumatoid arthritis not on medication -Outpatient follow-up  with primary physician   Prior toxic nodular goiter -TSH normal -Continue propylthiouracil 50 milligram 3 times daily  History of throat cancer -s/p prior history of radiation   Bipolar vs personality disorder -Outpatient psychiatry evaluation   Goals of care   Code Status: Full Code    Mobility: Encourage ambulation.  SNF recommended by PT Skin assessment:     Nutritional status:  Body mass index is 20.91 kg/m.          Diet:  Diet Order             Diet Heart Room service appropriate? Yes; Fluid consistency: Thin  Diet effective now                   DVT prophylaxis:  SCDs Start: 01/19/22 2140 apixaban (ELIQUIS) tablet 5 mg   Antimicrobials: None Fluid: None Consultants: Cardiology, orthopedics Family Communication: None at bedside  Status is: Inpatient  Continue in-hospital care because: Pending cardiology clearance and placement Level of care: Progressive   Dispo: The patient is from: Home              Anticipated d/c is to: SNF              Patient currently is not medically stable to d/c.   Difficult to place patient No     Infusions:     Scheduled Meds:  amiodarone  200 mg Oral BID   apixaban  5 mg Oral BID   Chlorhexidine Gluconate Cloth  6 each Topical Daily   docusate sodium  100 mg Oral BID   empagliflozin  10 mg Oral  Daily   ferrous sulfate  325 mg Oral TID PC   metoprolol succinate  100 mg Oral Daily   propylthiouracil  50 mg Oral TID   sacubitril-valsartan  1 tablet Oral BID   senna  1 tablet Oral BID   spironolactone  25 mg Oral Daily    PRN meds: acetaminophen, ALPRAZolam, alum & mag hydroxide-simeth, bisacodyl, haloperidol lactate, HYDROmorphone (DILAUDID) injection, lip balm, menthol-cetylpyridinium **OR** phenol, ondansetron **OR** ondansetron (ZOFRAN) IV, mouth rinse, oxyCODONE, oxyCODONE, polyethylene glycol   Antimicrobials: Anti-infectives (From admission, onward)    Start     Dose/Rate Route Frequency  Ordered Stop   01/19/22 2330  ceFAZolin (ANCEF) IVPB 2g/100 mL premix        2 g 200 mL/hr over 30 Minutes Intravenous Every 6 hours 01/19/22 2139 01/20/22 0655   01/19/22 1609  ceFAZolin (ANCEF) 2-4 GM/100ML-% IVPB  Status:  Discontinued       Note to Pharmacy: Key, Kristopher: cabinet override      01/19/22 1609 01/19/22 1624   01/19/22 1603  ceFAZolin (ANCEF) 2-4 GM/100ML-% IVPB       Note to Pharmacy: Hassie Bruce A: cabinet override      01/19/22 1603 01/19/22 1733   01/19/22 0945  ceFAZolin (ANCEF) IVPB 2g/100 mL premix        2 g 200 mL/hr over 30 Minutes Intravenous On call to O.R. 01/19/22 0857 01/19/22 1720       Objective: Vitals:   01/23/22 0800 01/23/22 1030  BP: (!) 146/53 (!) 144/79  Pulse: 70 (!) 121  Resp: (!) 22 (!) 25  Temp: 97.6 F (36.4 C)   SpO2: 98%     Intake/Output Summary (Last 24 hours) at 01/23/2022 1134 Last data filed at 01/23/2022 0828 Gross per 24 hour  Intake 1038.37 ml  Output 400 ml  Net 638.37 ml    Filed Weights   01/18/22 2040 01/23/22 0455  Weight: 50.3 kg 50.2 kg   Weight change:  Body mass index is 20.91 kg/m.   Physical Exam: General exam: Pleasant, elderly Caucasian female.  Not in physical distress Skin: No rashes, lesions or ulcers. HEENT: Atraumatic, normocephalic, no obvious bleeding Lungs: Clear to auscultation bilaterally CVS: Regular rate and rhythm, no murmur GI/Abd soft, nontender, nondistended, bowel sound present CNS: Alert, awake, oriented x3 Psychiatry: Mood appropriate Extremities: No pedal edema, no calf tenderness.  Data Review: I have personally reviewed the laboratory data and studies available.  F/u labs ordered Unresulted Labs (From admission, onward)     Start     Ordered   01/21/22 0500  Comprehensive metabolic panel  Daily at 5am,   R     Question:  Specimen collection method  Answer:  Lab=Lab collect   01/20/22 0824   01/21/22 0500  Magnesium  Daily at 5am,   R     Question:   Specimen collection method  Answer:  Lab=Lab collect   01/20/22 0824   01/21/22 0500  Phosphorus  Daily at 5am,   R     Question:  Specimen collection method  Answer:  Lab=Lab collect   01/20/22 0824   01/21/22 0500  CBC with Differential/Platelet  Daily at 5am,   R     Question:  Specimen collection method  Answer:  Lab=Lab collect   01/20/22 5956            Signed, Terrilee Croak, MD [facility] 01/23/2022

## 2022-01-23 NOTE — Progress Notes (Signed)
DAILY PROGRESS NOTE   Patient Name: Rhonda Marshall Date of Encounter: 01/23/2022 Cardiologist: Minus Breeding, MD  Chief Complaint   No complaints  Patient Profile   71 yo female with longstanding persistent afib, LBBB, presented with femur fracture, asked to see for afib and preop  Subjective   Had successful TEE/DCCV yesterday - maintaining sinus rhythm. Metoprolol decreased yesterday for bradycardia. On IV amiodarone. 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 Denies shortness of breath.  Objective   Vitals:   01/23/22 0500 01/23/22 0600 01/23/22 0700 01/23/22 0800  BP: (!) 164/68 (!) 143/60 (!) 138/57 (!) 146/53  Pulse: 64 (!) 52 (!) 53 70  Resp: '18 14 11 '$ (!) 22  Temp:    97.6 F (36.4 C)  TempSrc:    Oral  SpO2: 99% 95% 96% 98%  Weight:      Height:        Intake/Output Summary (Last 24 hours) at 01/23/2022 0851 Last data filed at 01/23/2022 4128 Gross per 24 hour  Intake 1078.19 ml  Output 100 ml  Net 978.19 ml   Filed Weights   01/18/22 2040 01/23/22 0455  Weight: 50.3 kg 50.2 kg    Physical Exam   General appearance: alert and no distress Neck: no carotid bruit, no JVD, and thyroid not enlarged, symmetric, no tenderness/mass/nodules Lungs: clear to auscultation bilaterally Heart: irregularly irregular rhythm and tachycardic Abdomen: soft, non-tender; bowel sounds normal; no masses,  no organomegaly Extremities: extremities normal, atraumatic, no cyanosis or edema Pulses: 2+ and symmetric Skin: Skin color, texture, turgor normal. No rashes or lesions Neurologic: Grossly normal Psych: Pleasant  Inpatient Medications    Scheduled Meds:  apixaban  5 mg Oral BID   Chlorhexidine Gluconate Cloth  6 each Topical Daily   docusate sodium  100 mg Oral BID   ferrous sulfate  325 mg Oral TID PC   metoprolol tartrate  50 mg Oral BID   propylthiouracil  50 mg Oral TID   sacubitril-valsartan  1 tablet Oral BID   senna  1  tablet Oral BID    Continuous Infusions:  amiodarone 30 mg/hr (01/23/22 0715)    PRN Meds: acetaminophen, ALPRAZolam, alum & mag hydroxide-simeth, bisacodyl, haloperidol lactate, HYDROmorphone (DILAUDID) injection, lip balm, menthol-cetylpyridinium **OR** phenol, ondansetron **OR** ondansetron (ZOFRAN) IV, mouth rinse, oxyCODONE, oxyCODONE, polyethylene glycol   Labs   Results for orders placed or performed during the hospital encounter of 01/18/22 (from the past 48 hour(s))  Comprehensive metabolic panel     Status: Abnormal   Collection Time: 01/22/22  2:22 AM  Result Value Ref Range   Sodium 138 135 - 145 mmol/L   Potassium 3.6 3.5 - 5.1 mmol/L   Chloride 104 98 - 111 mmol/L   CO2 24 22 - 32 mmol/L   Glucose, Bld 103 (H) 70 - 99 mg/dL    Comment: Glucose reference range applies only to samples taken after fasting for at least 8 hours.   BUN 15 8 - 23 mg/dL   Creatinine, Ser 0.93 0.44 - 1.00 mg/dL   Calcium 9.4 8.9 - 10.3 mg/dL   Total Protein 6.7 6.5 - 8.1 g/dL   Albumin 3.2 (L) 3.5 - 5.0 g/dL   AST 24 15 - 41 U/L   ALT 13 0 - 44 U/L   Alkaline Phosphatase 101 38 - 126 U/L   Total Bilirubin 0.7 0.3 - 1.2 mg/dL   GFR, Estimated >60 >60 mL/min    Comment: (NOTE) Calculated using  the CKD-EPI Creatinine Equation (2021)    Anion gap 10 5 - 15    Comment: Performed at [facility], [place] [address] [place], [place] [address]  Magnesium     Status: None   Collection Time: 01/22/22  2:22 AM  Result Value Ref Range   Magnesium 2.1 1.7 - 2.4 mg/dL    Comment: Performed at [facility], [place] [address], [place], [place] [address]  Phosphorus     Status: None   Collection Time: 01/22/22  2:22 AM  Result Value Ref Range   Phosphorus 2.5 2.5 - 4.6 mg/dL    Comment: Performed at [facility], [place] [address], [place], [place] [address]  CBC with Differential/Platelet     Status: Abnormal   Collection Time: 01/22/22  2:22 AM   Result Value Ref Range   WBC 9.7 4.0 - 10.5 K/uL   RBC 3.76 (L) 3.87 - 5.11 MIL/uL   Hemoglobin 12.0 12.0 - 15.0 g/dL   HCT 35.8 (L) 36.0 - 46.0 %   MCV 95.2 80.0 - 100.0 fL   MCH 31.9 26.0 - 34.0 pg   MCHC 33.5 30.0 - 36.0 g/dL   RDW 14.0 11.5 - 15.5 %   Platelets 314 150 - 400 K/uL   nRBC 0.0 0.0 - 0.2 %   Neutrophils Relative % 73 %   Neutro Abs 7.1 1.7 - 7.7 K/uL   Lymphocytes Relative 17 %   Lymphs Abs 1.6 0.7 - 4.0 K/uL   Monocytes Relative 9 %   Monocytes Absolute 0.9 0.1 - 1.0 K/uL   Eosinophils Relative 0 %   Eosinophils Absolute 0.0 0.0 - 0.5 K/uL   Basophils Relative 0 %   Basophils Absolute 0.0 0.0 - 0.1 K/uL   Immature Granulocytes 1 %   Abs Immature Granulocytes 0.06 0.00 - 0.07 K/uL    Comment: Performed at [facility], [place] [address], [place], [place] [address]  Brain natriuretic peptide     Status: Abnormal   Collection Time: 01/22/22  2:22 AM  Result Value Ref Range   B Natriuretic Peptide 1,960.2 (H) 0.0 - 100.0 pg/mL    Comment: Performed at [facility], [place] [address] [place], [place] [address]  Comprehensive metabolic panel     Status: Abnormal   Collection Time: 01/23/22  3:13 AM  Result Value Ref Range   Sodium 137 135 - 145 mmol/L   Potassium 3.6 3.5 - 5.1 mmol/L   Chloride 103 98 - 111 mmol/L   CO2 25 22 - 32 mmol/L   Glucose, Bld 105 (H) 70 - 99 mg/dL    Comment: Glucose reference range applies only to samples taken after fasting for at least 8 hours.   BUN 13 8 - 23 mg/dL   Creatinine, Ser 0.82 0.44 - 1.00 mg/dL   Calcium 8.7 (L) 8.9 - 10.3 mg/dL   Total Protein 5.9 (L) 6.5 - 8.1 g/dL   Albumin 2.8 (L) 3.5 - 5.0 g/dL   AST 25 15 - 41 U/L   ALT 14 0 - 44 U/L   Alkaline Phosphatase 87 38 - 126 U/L   Total Bilirubin 0.3 0.3 - 1.2 mg/dL   GFR, Estimated >60 >60 mL/min    Comment: (NOTE) Calculated using the CKD-EPI Creatinine Equation (2021)    Anion gap 9 5 - 15    Comment: Performed at [facility], [place] [address], [place],  [address]  Magnesium  Status: None   Collection Time: 01/23/22  3:13 AM  Result Value Ref Range   Magnesium 2.0 1.7 - 2.4 mg/dL    Comment: Performed at [facility], [place] [address], [place], [place] [address]  Phosphorus     Status: None   Collection Time: 01/23/22  3:13 AM  Result Value Ref Range   Phosphorus 3.3 2.5 - 4.6 mg/dL    Comment: Performed at [facility], [place] [address], [place], [place] [address]  CBC with Differential/Platelet     Status: Abnormal   Collection Time: 01/23/22  3:13 AM  Result Value Ref Range   WBC 9.1 4.0 - 10.5 K/uL   RBC 3.58 (L) 3.87 - 5.11 MIL/uL   Hemoglobin 11.4 (L) 12.0 - 15.0 g/dL   HCT 34.1 (L) 36.0 - 46.0 %   MCV 95.3 80.0 - 100.0 fL   MCH 31.8 26.0 - 34.0 pg   MCHC 33.4 30.0 - 36.0 g/dL   RDW 14.2 11.5 - 15.5 %   Platelets 322 150 - 400 K/uL   nRBC 0.0 0.0 - 0.2 %   Neutrophils Relative % 73 %   Neutro Abs 6.6 1.7 - 7.7 K/uL   Lymphocytes Relative 15 %   Lymphs Abs 1.3 0.7 - 4.0 K/uL   Monocytes Relative 10 %   Monocytes Absolute 0.9 0.1 - 1.0 K/uL   Eosinophils Relative 1 %   Eosinophils Absolute 0.1 0.0 - 0.5 K/uL   Basophils Relative 0 %   Basophils Absolute 0.0 0.0 - 0.1 K/uL   Immature Granulocytes 1 %   Abs Immature Granulocytes 0.06 0.00 - 0.07 K/uL    Comment: Performed at [facility], [place] [address], [place], [place] [address]    ECG   N/A  Telemetry   Afib with RVR - Personally Reviewed  Radiology    ECHO TEE  Result Date: 01/22/2022    TRANSESOPHOGEAL ECHO REPORT   Patient Name:   LEXII WALSH Date of Exam: 01/22/2022 Medical Rec #:  [id number]        Height:       61.0 in Accession #:    [id number]       Weight:       110.9 lb Date of Birth:  September 12, 1950        BSA:          1.470 m Patient Age:    73 years         BP:           94/83 mmHg Patient Gender: F                HR:           108 bpm.  Exam Location:  Inpatient Procedure: 3D Echo, Transesophageal Echo, Cardiac Doppler and Color Doppler Indications:     R94.31 Abnormal EKG; I48.91* Unspeicified atrial fibrillation  History:         Patient has prior history of Echocardiogram examinations, most                  recent 01/19/2022. Abnormal ECG, Arrythmias:Atrial Fibrillation                  and LBBB, Signs/Symptoms:Altered Mental Status, Chest Pain and                  Dyspnea; Risk Factors:Current Smoker.  Sonographer:     Dyess Referring Phys:  [id number] Nira Conn  E [place] Diagnosing Phys: Gwyndolyn Kaufman MD PROCEDURE: After discussion of the risks and benefits of a TEE, an informed consent was obtained from the patient. The transesophogeal probe was passed without difficulty through the esophogus of the patient. Imaged were obtained with the patient in a left lateral decubitus position. Sedation performed by different physician. The patient was monitored while under deep sedation. Anesthestetic sedation was provided intravenously by Anesthesiology: '153mg'$  of Propofol. The patient's vital signs; including heart rate, blood pressure, and oxygen saturation; remained stable throughout the procedure. The patient developed no complications during the procedure. A successful direct current cardioversion was performed at 200 joules with 2 attempts. IMPRESSIONS  1. Left ventricular ejection fraction, by estimation, is 30 to 35%. The left ventricle has moderately decreased function.  2. Right ventricular systolic function is mildly reduced. The right ventricular size is normal.  3. Left atrial size was moderately dilated. There was smoke in the left atrial appendage without evidence of thrombus.  4. Right atrial size was moderately dilated.  5. The mitral valve is abnormal. There is mild thickening and calcification of the mitral valve leaflets. There is mild prolapse of the A2 and P2 scallops of the mitral valve with resultant mild mitral  regurgitation.  6. The aortic valve is tricuspid. There is mild calcification of the aortic valve. There is mild thickening of the aortic valve. Aortic valve regurgitation is not visualized.  7. There is grade IV plaque at the level of the aortic arch. Aortic dilatation noted. There is borderline dilatation of the ascending aorta, measuring 36 mm. FINDINGS  Left Ventricle: Left ventricular ejection fraction, by estimation, is 30 to 35%. The left ventricle has moderately decreased function. The left ventricular internal cavity size was normal in size. Right Ventricle: The right ventricular size is normal. No increase in right ventricular wall thickness. Right ventricular systolic function is mildly reduced. Left Atrium: Left atrial size was moderately dilated. No left atrial/left atrial appendage thrombus was detected. Right Atrium: Right atrial size was moderately dilated. Pericardium: There is no evidence of pericardial effusion. Mitral Valve: The mitral valve is abnormal. There is mild prolapse of A2 and P2 scallops of the mitral valve. There is mild thickening of the mitral valve leaflet(s). There is mild calcification of the mitral valve leaflet(s). Mild mitral valve regurgitation. Tricuspid Valve: The tricuspid valve is normal in structure. Tricuspid valve regurgitation is trivial. Aortic Valve: The aortic valve is tricuspid. There is mild calcification of the aortic valve. There is mild thickening of the aortic valve. Aortic valve regurgitation is not visualized. Pulmonic Valve: The pulmonic valve was normal in structure. Pulmonic valve regurgitation is mild to moderate. Aorta: There is grade IV plaque at the level of the aortic arch. Aortic dilatation noted. There is borderline dilatation of the ascending aorta, measuring 36 mm. IAS/Shunts: The atrial septum is grossly normal.   AORTA Ao Asc diam: 3.60 cm MR Peak grad:    86.1 mmHg MR Mean grad:    45.0 mmHg MR Vmax:         464.00 cm/s MR Vmean:        308.0  cm/s MR PISA:         1.57 cm MR PISA Eff ROA: 13 mm MR PISA Radius:  0.50 cm Gwyndolyn Kaufman MD Electronically signed by Gwyndolyn Kaufman MD Signature Date/Time: 01/22/2022/4:11:39 PM    Final     Cardiac Studies   See echo above  Assessment   Principal Problem:   Hip fracture (  [place]) Active Problems:   Longstanding persistent atrial fibrillation (HCC)   Rheumatoid arthritis (HCC)   Chronic systolic CHF (congestive heart failure) (HCC)   Hyperthyroidism   Malignant neoplasm of glottis (HCC)   Dehydration   Elevated troponin   Hypokalemia   LBBB (left bundle branch block)   Plan   Successful DCCV yesterday - maintaining sinus rhythm on amiodarone. Will switch to 200 mg po BID x 7 days, then taper to 200 mg daily. Switch metoprolol to Toprol XL 100 mg daily. Add aldactone 25 mg daily and Jardiance 10 mg daily. Continue low dose Entresto - can titrate up after discharge as tolerated. Does not appear overtly volume overloaded, but BNP 1960. Will hold on loop diuretic for now - and see how she does with Jardiance and aldactone - may need to add furosemide as well. Follow I's/O's and daily weights. Ok to move out of ICU - probably close to d/c.  Time Spent Directly with Patient:  I have spent a total of 35 minutes with the patient reviewing hospital notes, telemetry, EKGs, labs and examining the patient as well as establishing an assessment and plan that was discussed personally with the patient.  > 50% of time was spent in direct patient care.  Length of Stay:  LOS: 5 days   Pixie Casino, MD, [facility], [place] Director of the Advanced Lipid Disorders &  Cardiovascular Risk Reduction Clinic Diplomate of the American Board of Clinical Lipidology Attending Cardiologist  Direct Dial: [phone]  Fax: [phone]  Website:  [email or web address] Agnes Probert 01/23/2022, 8:51 AM

## 2022-01-24 ENCOUNTER — Ambulatory Visit: Payer: 59 | Admitting: Endocrinology

## 2022-01-24 DIAGNOSIS — I5022 Chronic systolic (congestive) heart failure: Secondary | ICD-10-CM | POA: Diagnosis not present

## 2022-01-24 DIAGNOSIS — S72001A Fracture of unspecified part of neck of right femur, initial encounter for closed fracture: Secondary | ICD-10-CM | POA: Diagnosis not present

## 2022-01-24 LAB — CBC WITH DIFFERENTIAL/PLATELET
Abs Immature Granulocytes: 0.06 10*3/uL (ref 0.00–0.07)
Basophils Absolute: 0 10*3/uL (ref 0.0–0.1)
Basophils Relative: 0 %
Eosinophils Absolute: 0.2 10*3/uL (ref 0.0–0.5)
Eosinophils Relative: 3 %
HCT: 35 % — ABNORMAL LOW (ref 36.0–46.0)
Hemoglobin: 11.6 g/dL — ABNORMAL LOW (ref 12.0–15.0)
Immature Granulocytes: 1 %
Lymphocytes Relative: 22 %
Lymphs Abs: 1.8 10*3/uL (ref 0.7–4.0)
MCH: 31.9 pg (ref 26.0–34.0)
MCHC: 33.1 g/dL (ref 30.0–36.0)
MCV: 96.2 fL (ref 80.0–100.0)
Monocytes Absolute: 0.8 10*3/uL (ref 0.1–1.0)
Monocytes Relative: 10 %
Neutro Abs: 5.2 10*3/uL (ref 1.7–7.7)
Neutrophils Relative %: 64 %
Platelets: 400 10*3/uL (ref 150–400)
RBC: 3.64 MIL/uL — ABNORMAL LOW (ref 3.87–5.11)
RDW: 14.3 % (ref 11.5–15.5)
WBC: 8.1 10*3/uL (ref 4.0–10.5)
nRBC: 0 % (ref 0.0–0.2)

## 2022-01-24 LAB — COMPREHENSIVE METABOLIC PANEL
ALT: 12 U/L (ref 0–44)
AST: 18 U/L (ref 15–41)
Albumin: 2.8 g/dL — ABNORMAL LOW (ref 3.5–5.0)
Alkaline Phosphatase: 86 U/L (ref 38–126)
Anion gap: 7 (ref 5–15)
BUN: 13 mg/dL (ref 8–23)
CO2: 25 mmol/L (ref 22–32)
Calcium: 8.9 mg/dL (ref 8.9–10.3)
Chloride: 106 mmol/L (ref 98–111)
Creatinine, Ser: 1 mg/dL (ref 0.44–1.00)
GFR, Estimated: 60 mL/min — ABNORMAL LOW (ref >60)
Glucose, Bld: 97 mg/dL (ref 70–99)
Potassium: 3.1 mmol/L — ABNORMAL LOW (ref 3.5–5.1)
Sodium: 138 mmol/L (ref 135–145)
Total Bilirubin: 0.5 mg/dL (ref 0.3–1.2)
Total Protein: 5.9 g/dL — ABNORMAL LOW (ref 6.5–8.1)

## 2022-01-24 LAB — MAGNESIUM: Magnesium: 2.1 mg/dL (ref 1.7–2.4)

## 2022-01-24 LAB — PHOSPHORUS: Phosphorus: 3.6 mg/dL (ref 2.5–4.6)

## 2022-01-24 MED ORDER — OXYCODONE-ACETAMINOPHEN 10-325 MG PO TABS: 1.0000 | ORAL_TABLET | ORAL | 0 refills | Status: DC | PRN

## 2022-01-24 MED ORDER — POTASSIUM CHLORIDE CRYS ER 20 MEQ PO TBCR
40.0000 meq | EXTENDED_RELEASE_TABLET | Freq: Once | ORAL | Status: AC
  Administered 2022-01-24: 40 meq via ORAL
  Filled 2022-01-24: qty 2

## 2022-01-24 NOTE — Progress Notes (Signed)
DAILY PROGRESS NOTE   Patient Name: Rhonda Marshall Date of Encounter: 01/24/2022 Cardiologist: Minus Breeding, MD  Chief Complaint   No complaints  Patient Profile   71 yo female with longstanding persistent afib, LBBB, presented with femur fracture, asked to see for afib and preop  Subjective   Reverted back to afib yesterday. HR reasonably controlled on amiodarone and metoprolol.  Objective   Vitals:   01/24/22 0000 01/24/22 0355 01/24/22 0400 01/24/22 0700  BP: (!) 111/54  103/60   Pulse: 68  75   Resp: 13  16   Temp:  98.1 F (36.7 C)    TempSrc:  Oral    SpO2: 95%  96%   Weight:    51.2 kg  Height:        Intake/Output Summary (Last 24 hours) at 01/24/2022 0751 Last data filed at 01/24/2022 0700 Gross per 24 hour  Intake 541.03 ml  Output 1175 ml  Net -633.97 ml   Filed Weights   01/18/22 2040 01/23/22 0455 01/24/22 0700  Weight: 50.3 kg 50.2 kg 51.2 kg    Physical Exam   General appearance: alert and no distress Neck: no carotid bruit, no JVD, and thyroid not enlarged, symmetric, no tenderness/mass/nodules Lungs: clear to auscultation bilaterally Heart: irregularly irregular rhythm Abdomen: soft, non-tender; bowel sounds normal; no masses,  no organomegaly Extremities: extremities normal, atraumatic, no cyanosis or edema Pulses: 2+ and symmetric Skin: Skin color, texture, turgor normal. No rashes or lesions Neurologic: Grossly normal Psych: Pleasant  Inpatient Medications    Scheduled Meds:  amiodarone  200 mg Oral BID   apixaban  5 mg Oral BID   Chlorhexidine Gluconate Cloth  6 each Topical Daily   docusate sodium  100 mg Oral BID   empagliflozin  10 mg Oral Daily   ferrous sulfate  325 mg Oral TID PC   metoprolol succinate  100 mg Oral Daily   propylthiouracil  50 mg Oral TID   sacubitril-valsartan  1 tablet Oral BID   spironolactone  25 mg Oral Daily    Continuous Infusions:    PRN Meds: acetaminophen, ALPRAZolam, alum & mag  hydroxide-simeth, haloperidol lactate, HYDROmorphone (DILAUDID) injection, lip balm, loperamide, menthol-cetylpyridinium **OR** phenol, ondansetron **OR** ondansetron (ZOFRAN) IV, mouth rinse, oxyCODONE, oxyCODONE, polyethylene glycol   Labs   Results for orders placed or performed during the hospital encounter of 01/18/22 (from the past 48 hour(s))  Comprehensive metabolic panel     Status: Abnormal   Collection Time: 01/23/22  3:13 AM  Result Value Ref Range   Sodium 137 135 - 145 mmol/L   Potassium 3.6 3.5 - 5.1 mmol/L   Chloride 103 98 - 111 mmol/L   CO2 25 22 - 32 mmol/L   Glucose, Bld 105 (H) 70 - 99 mg/dL    Comment: Glucose reference range applies only to samples taken after fasting for at least 8 hours.   BUN 13 8 - 23 mg/dL   Creatinine, Ser 0.82 0.44 - 1.00 mg/dL   Calcium 8.7 (L) 8.9 - 10.3 mg/dL   Total Protein 5.9 (L) 6.5 - 8.1 g/dL   Albumin 2.8 (L) 3.5 - 5.0 g/dL   AST 25 15 - 41 U/L   ALT 14 0 - 44 U/L   Alkaline Phosphatase 87 38 - 126 U/L   Total Bilirubin 0.3 0.3 - 1.2 mg/dL   GFR, Estimated >60 >60 mL/min    Comment: (NOTE) Calculated using the CKD-EPI Creatinine Equation (2021)    Anion gap  9 5 - 15    Comment: Performed at [facility], [place] [address] [place], [place] [address]  Magnesium     Status: None   Collection Time: 01/23/22  3:13 AM  Result Value Ref Range   Magnesium 2.0 1.7 - 2.4 mg/dL    Comment: Performed at [facility], [place] [address] [place], [place] [address]  Phosphorus     Status: None   Collection Time: 01/23/22  3:13 AM  Result Value Ref Range   Phosphorus 3.3 2.5 - 4.6 mg/dL    Comment: Performed at [facility], [place] [address], [place], [place] [address]  CBC with Differential/Platelet     Status: Abnormal   Collection Time: 01/23/22  3:13 AM  Result Value Ref Range   WBC 9.1 4.0 - 10.5 K/uL   RBC 3.58 (L) 3.87 - 5.11 MIL/uL   Hemoglobin 11.4 (L) 12.0 - 15.0 g/dL   HCT  34.1 (L) 36.0 - 46.0 %   MCV 95.3 80.0 - 100.0 fL   MCH 31.8 26.0 - 34.0 pg   MCHC 33.4 30.0 - 36.0 g/dL   RDW 14.2 11.5 - 15.5 %   Platelets 322 150 - 400 K/uL   nRBC 0.0 0.0 - 0.2 %   Neutrophils Relative % 73 %   Neutro Abs 6.6 1.7 - 7.7 K/uL   Lymphocytes Relative 15 %   Lymphs Abs 1.3 0.7 - 4.0 K/uL   Monocytes Relative 10 %   Monocytes Absolute 0.9 0.1 - 1.0 K/uL   Eosinophils Relative 1 %   Eosinophils Absolute 0.1 0.0 - 0.5 K/uL   Basophils Relative 0 %   Basophils Absolute 0.0 0.0 - 0.1 K/uL   Immature Granulocytes 1 %   Abs Immature Granulocytes 0.06 0.00 - 0.07 K/uL    Comment: Performed at [facility], [place] [address], [place], [place] [address]  Comprehensive metabolic panel     Status: Abnormal   Collection Time: 01/24/22  2:47 AM  Result Value Ref Range   Sodium 138 135 - 145 mmol/L   Potassium 3.1 (L) 3.5 - 5.1 mmol/L   Chloride 106 98 - 111 mmol/L   CO2 25 22 - 32 mmol/L   Glucose, Bld 97 70 - 99 mg/dL    Comment: Glucose reference range applies only to samples taken after fasting for at least 8 hours.   BUN 13 8 - 23 mg/dL   Creatinine, Ser 1.00 0.44 - 1.00 mg/dL   Calcium 8.9 8.9 - 10.3 mg/dL   Total Protein 5.9 (L) 6.5 - 8.1 g/dL   Albumin 2.8 (L) 3.5 - 5.0 g/dL   AST 18 15 - 41 U/L   ALT 12 0 - 44 U/L   Alkaline Phosphatase 86 38 - 126 U/L   Total Bilirubin 0.5 0.3 - 1.2 mg/dL   GFR, Estimated 60 (L) >60 mL/min    Comment: (NOTE) Calculated using the CKD-EPI Creatinine Equation (2021)    Anion gap 7 5 - 15    Comment: Performed at [facility], [place] [address], [place], [place] [address]  Magnesium     Status: None   Collection Time: 01/24/22  2:47 AM  Result Value Ref Range   Magnesium 2.1 1.7 - 2.4 mg/dL    Comment: Performed at [facility], [place] [address] [place], [place] [address]  Phosphorus     Status: None   Collection Time: 01/24/22  2:47 AM  Result Value Ref Range   Phosphorus  3.6 2.5 - 4.6 mg/dL    Comment: Performed at [facility], [place] [address], [place], [place] [address]  CBC with Differential/Platelet     Status: Abnormal   Collection Time: 01/24/22  2:47 AM  Result Value Ref Range   WBC 8.1 4.0 - 10.5 K/uL   RBC 3.64 (L) 3.87 - 5.11 MIL/uL   Hemoglobin 11.6 (L) 12.0 - 15.0 g/dL   HCT 35.0 (L) 36.0 - 46.0 %   MCV 96.2 80.0 - 100.0 fL   MCH 31.9 26.0 - 34.0 pg   MCHC 33.1 30.0 - 36.0 g/dL   RDW 14.3 11.5 - 15.5 %   Platelets 400 150 - 400 K/uL   nRBC 0.0 0.0 - 0.2 %   Neutrophils Relative % 64 %   Neutro Abs 5.2 1.7 - 7.7 K/uL   Lymphocytes Relative 22 %   Lymphs Abs 1.8 0.7 - 4.0 K/uL   Monocytes Relative 10 %   Monocytes Absolute 0.8 0.1 - 1.0 K/uL   Eosinophils Relative 3 %   Eosinophils Absolute 0.2 0.0 - 0.5 K/uL   Basophils Relative 0 %   Basophils Absolute 0.0 0.0 - 0.1 K/uL   Immature Granulocytes 1 %   Abs Immature Granulocytes 0.06 0.00 - 0.07 K/uL    Comment: Performed at [facility], [place] [address] [place], [place] [address]    ECG   N/A  Telemetry   Afib with RVR - Personally Reviewed  Radiology    ECHO TEE  Result Date: 01/22/2022    TRANSESOPHOGEAL ECHO REPORT   Patient Name:   KYLIANA STANDEN Date of Exam: 01/22/2022 Medical Rec #:  [id number]        Height:       61.0 in Accession #:    [id number]       Weight:       110.9 lb Date of Birth:  [birth year]        BSA:          1.470 m Patient Age:    36 years         BP:           94/83 mmHg Patient Gender: F                HR:           108 bpm. Exam Location:  Inpatient Procedure: 3D Echo, Transesophageal Echo, Cardiac Doppler and Color Doppler Indications:     R94.31 Abnormal EKG; I48.91* Unspeicified atrial fibrillation  History:         Patient has prior history of Echocardiogram examinations, most                  recent 01/19/2022. Abnormal ECG, Arrythmias:Atrial Fibrillation                  and LBBB, Signs/Symptoms:Altered Mental  Status, Chest Pain and                  Dyspnea; Risk Factors:Current Smoker.  Sonographer:     Roseanna Rainbow RDCS Referring Phys:  [id number] Greer Ee [place] Diagnosing Phys: Gwyndolyn Kaufman MD PROCEDURE: After discussion of the risks and benefits of a TEE, an informed consent was obtained from the patient. The transesophogeal probe was passed without difficulty through the esophogus of the patient. Imaged were obtained with the patient in a left lateral decubitus position. Sedation performed by different physician.  The patient was monitored while under deep sedation. Anesthestetic sedation was provided intravenously by Anesthesiology: '153mg'$  of Propofol. The patient's vital signs; including heart rate, blood pressure, and oxygen saturation; remained stable throughout the procedure. The patient developed no complications during the procedure. A successful direct current cardioversion was performed at 200 joules with 2 attempts. IMPRESSIONS  1. Left ventricular ejection fraction, by estimation, is 30 to 35%. The left ventricle has moderately decreased function.  2. Right ventricular systolic function is mildly reduced. The right ventricular size is normal.  3. Left atrial size was moderately dilated. There was smoke in the left atrial appendage without evidence of thrombus.  4. Right atrial size was moderately dilated.  5. The mitral valve is abnormal. There is mild thickening and calcification of the mitral valve leaflets. There is mild prolapse of the A2 and P2 scallops of the mitral valve with resultant mild mitral regurgitation.  6. The aortic valve is tricuspid. There is mild calcification of the aortic valve. There is mild thickening of the aortic valve. Aortic valve regurgitation is not visualized.  7. There is grade IV plaque at the level of the aortic arch. Aortic dilatation noted. There is borderline dilatation of the ascending aorta, measuring 36 mm. FINDINGS  Left Ventricle: Left ventricular ejection  fraction, by estimation, is 30 to 35%. The left ventricle has moderately decreased function. The left ventricular internal cavity size was normal in size. Right Ventricle: The right ventricular size is normal. No increase in right ventricular wall thickness. Right ventricular systolic function is mildly reduced. Left Atrium: Left atrial size was moderately dilated. No left atrial/left atrial appendage thrombus was detected. Right Atrium: Right atrial size was moderately dilated. Pericardium: There is no evidence of pericardial effusion. Mitral Valve: The mitral valve is abnormal. There is mild prolapse of A2 and P2 scallops of the mitral valve. There is mild thickening of the mitral valve leaflet(s). There is mild calcification of the mitral valve leaflet(s). Mild mitral valve regurgitation. Tricuspid Valve: The tricuspid valve is normal in structure. Tricuspid valve regurgitation is trivial. Aortic Valve: The aortic valve is tricuspid. There is mild calcification of the aortic valve. There is mild thickening of the aortic valve. Aortic valve regurgitation is not visualized. Pulmonic Valve: The pulmonic valve was normal in structure. Pulmonic valve regurgitation is mild to moderate. Aorta: There is grade IV plaque at the level of the aortic arch. Aortic dilatation noted. There is borderline dilatation of the ascending aorta, measuring 36 mm. IAS/Shunts: The atrial septum is grossly normal.   AORTA Ao Asc diam: 3.60 cm MR Peak grad:    86.1 mmHg MR Mean grad:    45.0 mmHg MR Vmax:         464.00 cm/s MR Vmean:        308.0 cm/s MR PISA:         1.57 cm MR PISA Eff ROA: 13 mm MR PISA Radius:  0.50 cm Gwyndolyn Kaufman MD Electronically signed by Gwyndolyn Kaufman MD Signature Date/Time: 01/22/2022/4:11:39 PM    Final     Cardiac Studies   See echo above  Assessment   Principal Problem:   Hip fracture ([place]) Active Problems:   Longstanding persistent atrial fibrillation (HCC)   Rheumatoid arthritis (HCC)    Chronic systolic CHF (congestive heart failure) (HCC)   Hyperthyroidism   Malignant neoplasm of glottis (HCC)   Dehydration   Elevated troponin   Hypokalemia   LBBB (left bundle branch block)   Plan  Converted back to afib after successful DCCV - few options at this point. Would continue with amiodarone and metoprolol in addition to GDMT. Could consider EP evaluation as outpatient for possible ablation. Potassium is low at 3.1, replete.  Time Spent Directly with Patient:  I have spent a total of 35 minutes with the patient reviewing hospital notes, telemetry, EKGs, labs and examining the patient as well as establishing an assessment and plan that was discussed personally with the patient.  > 50% of time was spent in direct patient care.  Length of Stay:  LOS: 6 days   Pixie Casino, MD, [facility], [place] Director of the [facility]  Card[facility] Diplomate of the American Board of Clinical Lipidology Attending Cardiologist  Direct Dial: [phone]  Fax: [phone]  Website:  [email or web address] Shernita Rabinovich 01/24/2022, 7:51 AM

## 2022-01-24 NOTE — Progress Notes (Signed)
Patient transferred to 4th floor, cell phone and charger with patient also with 2 belonging bags.

## 2022-01-24 NOTE — Progress Notes (Signed)
PROGRESS NOTE  Bridgit Largent  DOB: January 10, 1951  PCP: Timoteo Gaul, FNP [id number]  DOA: 01/18/2022  LOS: 6 days  Hospital Day: 7  Brief narrative: Rhonda Marshall is a 71 y.o. female with PMH significant for HTN, permanent A-fib on Eliquis, HTN, chronic systolic CHF (EF 44%), anxiety, rheumatoid arthritis, history of throat cancer. On 7/5, patient was in her garden, she caught her flip-flop on a root and fell causing right hip pain.  The pain persisted for 3 days and hence he presented to the ED on 7/8.   Imaging showed right femoral neck fracture. Admitted to hospitalist service.  Orthopedic consult obtained.. 7/9, underwent hemiarthroplasty. 7/12, underwent successful DC cardioversion for A-fib  Subjective: Patient was seen and examined this morning.  Pleasant elderly Caucasian female.  Sitting up in chair.  Taking her lunch.  Not in distress.  No new symptoms.  Her heart rate has been erratic.  Between 90 and 120 today.  Cardiology following.  Assessment and plan: Right femoral neck fracture -7/9, underwent hemiarthroplasty. -Pain control, DVT prophylaxis per orthopedics.  A-fib with RVR History of permanent A-fib  -s/p successful TEE/DC cardioversion on 7/12.  However heart rhythm reverted back to A-fib again on 7/13. -Cardiology follow-up appreciated.  Currently on metoprolol succinate 100 mg daily and amiodarone 200 mg po BID x 7 days, then taper to 200 mg daily.  Heart rate between 90-120 in last 24 hours.  Probable EP evaluation as an outpatient -Continue to monitor on telemetry -CHADSVASC score more than 3.  Continue anticoagulation with Eliquis twice daily.  Chronic systolic CHF Presumed nonischemic cardiomyopathy Essential hypertension -Echo with EF 30 to 35% with global hypokinesis which is less than 45 to 50% in 2020.  -Remains euvolemic. -CHF regimen: Metoprolol succinate 100 mg daily, Entresto 24-26 mg twice daily.  Today, cardiology added Aldactone 25  mg daily and Jardiance 10 mg daily.  Not on Lasix yet.  Anxiety -Currently on Xanax, Ativan as needed, Haldol as needed   Rheumatoid arthritis not on medication -Outpatient follow-up with primary physician   Prior toxic nodular goiter -TSH normal -Continue propylthiouracil 50 milligram 3 times daily  History of throat cancer -s/p prior history of radiation   Bipolar vs personality disorder -Outpatient psychiatry evaluation   Goals of care   Code Status: Full Code    Mobility: Encourage ambulation.  SNF recommended by PT Skin assessment:     Nutritional status:  Body mass index is 21.33 kg/m.          Diet:  Diet Order             Diet Heart Room service appropriate? Yes; Fluid consistency: Thin  Diet effective now                   DVT prophylaxis:  SCDs Start: 01/19/22 2140 apixaban (ELIQUIS) tablet 5 mg   Antimicrobials: None Fluid: None Consultants: Cardiology, orthopedics Family Communication: None at bedside  Status is: Inpatient  Continue in-hospital care because: Pending cardiology clearance and placement Level of care: Progressive   Dispo: The patient is from: Home              Anticipated d/c is to: SNF likely tomorrow if heart rate remains controlled for next 24 hours              Patient currently is not medically stable to d/c. Difficult to place patient No     Infusions:     Scheduled Meds:  amiodarone  200 mg Oral BID   apixaban  5 mg Oral BID   Chlorhexidine Gluconate Cloth  6 each Topical Daily   docusate sodium  100 mg Oral BID   empagliflozin  10 mg Oral Daily   ferrous sulfate  325 mg Oral TID PC   metoprolol succinate  100 mg Oral Daily   propylthiouracil  50 mg Oral TID   sacubitril-valsartan  1 tablet Oral BID   spironolactone  25 mg Oral Daily    PRN meds: acetaminophen, ALPRAZolam, alum & mag hydroxide-simeth, haloperidol lactate, HYDROmorphone (DILAUDID) injection, lip balm, loperamide,  menthol-cetylpyridinium **OR** phenol, ondansetron **OR** ondansetron (ZOFRAN) IV, mouth rinse, oxyCODONE, oxyCODONE, polyethylene glycol   Antimicrobials: Anti-infectives (From admission, onward)    Start     Dose/Rate Route Frequency Ordered Stop   01/19/22 2330  ceFAZolin (ANCEF) IVPB 2g/100 mL premix        2 g 200 mL/hr over 30 Minutes Intravenous Every 6 hours 01/19/22 2139 01/20/22 0655   01/19/22 1609  ceFAZolin (ANCEF) 2-4 GM/100ML-% IVPB  Status:  Discontinued       Note to Pharmacy: Key, Kristopher: cabinet override      01/19/22 1609 01/19/22 1624   01/19/22 1603  ceFAZolin (ANCEF) 2-4 GM/100ML-% IVPB       Note to Pharmacy: Hassie Bruce A: cabinet override      01/19/22 1603 01/19/22 1733   01/19/22 0945  ceFAZolin (ANCEF) IVPB 2g/100 mL premix        2 g 200 mL/hr over 30 Minutes Intravenous On call to O.R. 01/19/22 0857 01/19/22 1720       Objective: Vitals:   01/24/22 0839 01/24/22 1110  BP: (!) 131/58 117/74  Pulse: (!) 111 91  Resp: (!) 22 18  Temp:  97.7 F (36.5 C)  SpO2: 96% 98%    Intake/Output Summary (Last 24 hours) at 01/24/2022 1350 Last data filed at 01/24/2022 1255 Gross per 24 hour  Intake 821.03 ml  Output 875 ml  Net -53.97 ml   Filed Weights   01/18/22 2040 01/23/22 0455 01/24/22 0700  Weight: 50.3 kg 50.2 kg 51.2 kg   Weight change: 1 kg Body mass index is 21.33 kg/m.   Physical Exam: General exam: Pleasant, elderly Caucasian female.  Not in physical distress Skin: No rashes, lesions or ulcers. HEENT: Atraumatic, normocephalic, no obvious bleeding Lungs: Clear to auscultation bilaterally CVS: Rate controlled A-fib, no murmur  GI/Abd soft, nontender, nondistended, bowel sound present CNS: Alert, awake, oriented x3 Psychiatry: Mood appropriate Extremities: No pedal edema, no calf tenderness.  Data Review: I have personally reviewed the laboratory data and studies available.  F/u labs ordered Unresulted Labs (From  admission, onward)     Start     Ordered   01/21/22 0500  Comprehensive metabolic panel  Daily at 5am,   R     Question:  Specimen collection method  Answer:  Lab=Lab collect   01/20/22 0824   01/21/22 0500  Magnesium  Daily at 5am,   R     Question:  Specimen collection method  Answer:  Lab=Lab collect   01/20/22 0824   01/21/22 0500  Phosphorus  Daily at 5am,   R     Question:  Specimen collection method  Answer:  Lab=Lab collect   01/20/22 0824   01/21/22 0500  CBC with Differential/Platelet  Daily at 5am,   R     Question:  Specimen collection method  Answer:  Lab=Lab collect   01/20/22 0824  Signed, Terrilee Croak, MD [facility] 01/24/2022

## 2022-01-24 NOTE — Progress Notes (Signed)
Physical Therapy Treatment Patient Details Name: Rhonda Marshall MRN: [id number] DOB: 12-28-1950 Today's Date: 01/24/2022   History of Present Illness 71 y.o. female with medical history significant of A-fib on Eliquis  Hypothyroidism bilateral carotid bruit, rheumatoid arthritis, LBBB, systolic CHF EF 48%  Hx of throat cancer.  Presented with right hip pain, Dx of R femoral neck fx, s/p R hip hemiarthroplasty 01/19/22. Onset afib, Cardio version 7/13.    PT Comments    Pt pleasant and very talkative. Received in chair agreed to PT. Sit to stand with MinA, increased gait distance with RW to 80f, HR 124bpm. Pt returned to bedside chair to complete B LE strengthening exercises. Overall good tolerance for activity, slightly lower HR today with ambulation.   Recommendations for follow up therapy are one component of a multi-disciplinary discharge planning process, led by the attending physician.  Recommendations may be updated based on patient status, additional functional criteria and insurance authorization.  Follow Up Recommendations  Skilled nursing-short term rehab (<3 hours/day) Can patient physically be transported by private vehicle: Yes   Assistance Recommended at Discharge Intermittent Supervision/Assistance  Patient can return home with the following A little help with walking and/or transfers;A little help with bathing/dressing/bathroom;Assistance with cooking/housework;Assist for transportation;Help with stairs or ramp for entrance   Equipment Recommendations  Rolling walker (2 wheels)    Recommendations for Other Services       Precautions / Restrictions Precautions Precautions: Fall Precaution Comments: [email or web address]  HR, OK to allow high per RN post cardio version Restrictions Weight Bearing Restrictions: Yes RLE Weight Bearing: Weight bearing as tolerated     Mobility  Bed Mobility               General bed mobility comments: Pt received in chair     Transfers Overall transfer level: Needs assistance Equipment used: Rolling walker (2 wheels) Transfers: Sit to/from Stand Sit to Stand: Min assist           General transfer comment: VCs for UE and LE placement for safety, min A to power up    Ambulation/Gait Ambulation/Gait assistance: Min guard Gait Distance (Feet): 80 Feet Assistive device: Rolling walker (2 wheels) Gait Pattern/deviations: Step-to pattern, Decreased step length - right, Decreased step length - left Gait velocity: decr     General Gait Details: vc's for sequencing and balance, HR 124bpm   Stairs             Wheelchair Mobility    Modified Rankin (Stroke Patients Only)       Balance                                            Cognition Arousal/Alertness: Awake/alert Behavior During Therapy: WFL for tasks assessed/performed Overall Cognitive Status: No family/caregiver present to determine baseline cognitive functioning                                 General Comments: alert and oriented x 4, talks continuously and at times is tangential in what she's saying, did not stop talking when I attempted to provide instructions        Exercises Total Joint Exercises Ankle Circles/Pumps: AROM, Both, 10 reps General Exercises - Lower Extremity Long Arc Quad: AROM, Both, 10 reps    General Comments  Pertinent Vitals/Pain Pain Assessment Pain Assessment: 0-10 Pain Score: 5  Pain Location: R hip with movement Pain Descriptors / Indicators: Guarding, Grimacing Pain Intervention(s): Monitored during session, Premedicated before session    Home Living                          Prior Function            PT Goals (current goals can now be found in the care plan section) Acute Rehab PT Goals Patient Stated Goal: to be able to walk Progress towards PT goals: Progressing toward goals    Frequency    Min 3X/week      PT Plan  Current plan remains appropriate    Co-evaluation              AM-PAC PT "6 Clicks" Mobility   Outcome Measure  Help needed turning from your back to your side while in a flat bed without using bedrails?: A Little Help needed moving from lying on your back to sitting on the side of a flat bed without using bedrails?: A Little Help needed moving to and from a bed to a chair (including a wheelchair)?: A Little Help needed standing up from a chair using your arms (e.g., wheelchair or bedside chair)?: A Little Help needed to walk in hospital room?: A Little Help needed climbing 3-5 steps with a railing? : A Lot 6 Click Score: 17    End of Session Equipment Utilized During Treatment: Gait belt Activity Tolerance: Patient tolerated treatment well Patient left: in chair;with call bell/phone within reach;with chair alarm set Nurse Communication: Mobility status PT Visit Diagnosis: Difficulty in walking, not elsewhere classified (R26.2);Pain;Muscle weakness (generalized) (M62.81) Pain - Right/Left: Right Pain - part of body: Hip     Time: 1346-1410 PT Time Calculation (min) (ACUTE ONLY): 24 min  Charges:  $Gait Training: 8-22 mins $Therapeutic Exercise: 8-22 mins                    Rhonda Marshall, PTA   Rhonda Marshall 01/24/2022, 5:18 PM

## 2022-01-24 NOTE — Discharge Instructions (Signed)
Diet: As you were doing prior to hospitalization   Shower:  May shower but keep the wounds dry, use an occlusive plastic wrap, NO SOAKING IN TUB.  If the bandage gets wet, change with a clean dry gauze.  If you have a splint on, leave the splint in place and keep the splint dry with a plastic bag.  Dressing:  You may change your dressing 3-5 days after surgery, unless you have a splint.  If you have a splint, then just leave the splint in place and we will change your bandages during your first follow-up appointment.    If you had hand or foot surgery, we will plan to remove your stitches in about 2 weeks in the office.  For all other surgeries, there are sticky tapes (steri-strips) on your wounds and all the stitches are absorbable.  Leave the steri-strips in place when changing your dressings, they will peel off with time, usually 2-3 weeks.  Activity:  Increase activity slowly as tolerated, but follow the weight bearing instructions below.  The rules on driving is that you can not be taking narcotics while you drive, and you must feel in control of the vehicle.    Weight Bearing:   weight bearing as tolerated on left leg  To prevent constipation: you may use a stool softener such as -  Colace (over the counter) 100 mg by mouth twice a day  Drink plenty of fluids (prune juice may be helpful) and high fiber foods Miralax (over the counter) for constipation as needed.    Itching:  If you experience itching with your medications, try taking only a single pain pill, or even half a pain pill at a time.  You may take up to 10 pain pills per day, and you can also use benadryl over the counter for itching or also to help with sleep.   Precautions:  If you experience chest pain or shortness of breath - call 911 immediately for transfer to the hospital emergency department!!  If you develop a fever greater that 101 F, purulent drainage from wound, increased redness or drainage from wound, or calf pain  -- Call the office at [phone]                                                Follow- Up Appointment:  Please call for an appointment to be seen in 2 weeks [place] - [phone]     

## 2022-01-25 DIAGNOSIS — S72001A Fracture of unspecified part of neck of right femur, initial encounter for closed fracture: Secondary | ICD-10-CM | POA: Diagnosis not present

## 2022-01-25 LAB — COMPREHENSIVE METABOLIC PANEL
ALT: 13 U/L (ref 0–44)
AST: 16 U/L (ref 15–41)
Albumin: 2.9 g/dL — ABNORMAL LOW (ref 3.5–5.0)
Alkaline Phosphatase: 85 U/L (ref 38–126)
Anion gap: 10 (ref 5–15)
BUN: 16 mg/dL (ref 8–23)
CO2: 22 mmol/L (ref 22–32)
Calcium: 9.1 mg/dL (ref 8.9–10.3)
Chloride: 105 mmol/L (ref 98–111)
Creatinine, Ser: 0.91 mg/dL (ref 0.44–1.00)
GFR, Estimated: >60 mL/min (ref >60)
Glucose, Bld: 90 mg/dL (ref 70–99)
Potassium: 3.5 mmol/L (ref 3.5–5.1)
Sodium: 137 mmol/L (ref 135–145)
Total Bilirubin: 0.5 mg/dL (ref 0.3–1.2)
Total Protein: 6.3 g/dL — ABNORMAL LOW (ref 6.5–8.1)

## 2022-01-25 LAB — CBC WITH DIFFERENTIAL/PLATELET
Abs Immature Granulocytes: 0.05 10*3/uL (ref 0.00–0.07)
Basophils Absolute: 0.1 10*3/uL (ref 0.0–0.1)
Basophils Relative: 1 %
Eosinophils Absolute: 0.3 10*3/uL (ref 0.0–0.5)
Eosinophils Relative: 4 %
HCT: 36.1 % (ref 36.0–46.0)
Hemoglobin: 11.9 g/dL — ABNORMAL LOW (ref 12.0–15.0)
Immature Granulocytes: 1 %
Lymphocytes Relative: 26 %
Lymphs Abs: 1.9 10*3/uL (ref 0.7–4.0)
MCH: 32 pg (ref 26.0–34.0)
MCHC: 33 g/dL (ref 30.0–36.0)
MCV: 97 fL (ref 80.0–100.0)
Monocytes Absolute: 0.8 10*3/uL (ref 0.1–1.0)
Monocytes Relative: 11 %
Neutro Abs: 4.2 10*3/uL (ref 1.7–7.7)
Neutrophils Relative %: 57 %
Platelets: 454 10*3/uL — ABNORMAL HIGH (ref 150–400)
RBC: 3.72 MIL/uL — ABNORMAL LOW (ref 3.87–5.11)
RDW: 14.7 % (ref 11.5–15.5)
WBC: 7.2 10*3/uL (ref 4.0–10.5)
nRBC: 0 % (ref 0.0–0.2)

## 2022-01-25 LAB — PHOSPHORUS: Phosphorus: 3.5 mg/dL (ref 2.5–4.6)

## 2022-01-25 LAB — MAGNESIUM: Magnesium: 2 mg/dL (ref 1.7–2.4)

## 2022-01-25 MED ORDER — ACETAMINOPHEN 325 MG PO TABS
325.0000 mg | ORAL_TABLET | Freq: Four times a day (QID) | ORAL | Status: DC | PRN
Start: 2022-01-25 — End: 2023-03-29

## 2022-01-25 MED ORDER — DOCUSATE SODIUM 100 MG PO CAPS: 100.0000 mg | ORAL_CAPSULE | Freq: Two times a day (BID) | ORAL | 0 refills | Status: DC

## 2022-01-25 MED ORDER — FERROUS SULFATE 325 (65 FE) MG PO TABS: 325.0000 mg | ORAL_TABLET | Freq: Three times a day (TID) | ORAL | 3 refills | Status: DC

## 2022-01-25 MED ORDER — POLYETHYLENE GLYCOL 3350 17 G PO PACK: 17.0000 g | PACK | Freq: Every day | ORAL | 0 refills | Status: DC | PRN

## 2022-01-25 MED ORDER — ALPRAZOLAM 1 MG PO TABS: 1.0000 mg | ORAL_TABLET | Freq: Three times a day (TID) | ORAL | 0 refills | Status: DC | PRN

## 2022-01-25 MED ORDER — METOPROLOL SUCCINATE ER 100 MG PO TB24
100.0000 mg | ORAL_TABLET | Freq: Two times a day (BID) | ORAL | Status: DC
  Administered 2022-01-25 – 2022-01-27 (×4): 100 mg via ORAL
  Filled 2022-01-25 (×4): qty 1

## 2022-01-25 NOTE — Progress Notes (Signed)
Progress Note  Patient Name: Rhonda Marshall Date of Encounter: 01/25/2022  [facility] Cardiologist: Minus Breeding, MD   Subjective   No acute events overnight. Heart rate remains elevated, up to 120s while in bed. No chest pain or shortness of breath. Pain well controlled.  Inpatient Medications    Scheduled Meds:  amiodarone  200 mg Oral BID   apixaban  5 mg Oral BID   Chlorhexidine Gluconate Cloth  6 each Topical Daily   docusate sodium  100 mg Oral BID   empagliflozin  10 mg Oral Daily   ferrous sulfate  325 mg Oral TID PC   metoprolol succinate  100 mg Oral BID   propylthiouracil  50 mg Oral TID   sacubitril-valsartan  1 tablet Oral BID   spironolactone  25 mg Oral Daily   Continuous Infusions:  PRN Meds: acetaminophen, ALPRAZolam, alum & mag hydroxide-simeth, haloperidol lactate, HYDROmorphone (DILAUDID) injection, lip balm, loperamide, menthol-cetylpyridinium **OR** phenol, ondansetron **OR** ondansetron (ZOFRAN) IV, mouth rinse, oxyCODONE, oxyCODONE, polyethylene glycol   Vital Signs    Vitals:   01/24/22 1746 01/24/22 2114 01/25/22 0141 01/25/22 0538  BP: 131/78 134/87 108/78 131/66  Pulse: 99 (!) 102 (!) 102 89  Resp: (!) '22 16 16   '$ Temp: 98 F (36.7 C) 98.2 F (36.8 C) 97.6 F (36.4 C) 98.2 F (36.8 C)  TempSrc: Oral Oral Oral Oral  SpO2: 99% 99% 99% 99%  Weight:    54.6 kg  Height:        Intake/Output Summary (Last 24 hours) at 01/25/2022 1136 Last data filed at 01/25/2022 0544 Gross per 24 hour  Intake 360 ml  Output 1300 ml  Net -940 ml      01/25/2022    5:38 AM 01/24/2022    7:00 AM 01/23/2022    4:55 AM  Last 3 Weights  Weight (lbs) 120 lb 5.9 oz 112 lb 14 oz 110 lb 10.7 oz  Weight (kg) 54.6 kg 51.2 kg 50.2 kg      Telemetry    Atrial fibrillation, with heart rates 100s-120s - Personally Reviewed  ECG    No new since 7/12 - Personally Reviewed  Physical Exam   GEN: No acute distress.   Neck: No JVD Cardiac: irregularly  irregular, no murmurs, rubs, or gallops.  Respiratory: Clear to auscultation bilaterally. GI: Soft, nontender, non-distended  MS: No edema; No deformity. Neuro:  Nonfocal  Psych: Normal affect   Labs    High Sensitivity Troponin:   Recent Labs  Lab 01/18/22 2216 01/19/22 0048 01/19/22 0249  TROPONINIHS 22* 22* 22*     Chemistry Recent Labs  Lab 01/23/22 0313 01/24/22 0247 01/25/22 0521  NA 137 138 137  K 3.6 3.1* 3.5  CL 103 106 105  CO2 '25 25 22  '$ GLUCOSE 105* 97 90  BUN '13 13 16  '$ CREATININE 0.82 1.00 0.91  CALCIUM 8.7* 8.9 9.1  MG 2.0 2.1 2.0  PROT 5.9* 5.9* 6.3*  ALBUMIN 2.8* 2.8* 2.9*  AST '25 18 16  '$ ALT '14 12 13  '$ ALKPHOS 87 86 85  BILITOT 0.3 0.5 0.5  GFRNONAA >60 60* >60  ANIONGAP '9 7 10    '$ Lipids No results for input(s): "CHOL", "TRIG", "HDL", "LABVLDL", "LDLCALC", "CHOLHDL" in the last 168 hours.  Hematology Recent Labs  Lab 01/23/22 0313 01/24/22 0247 01/25/22 0521  WBC 9.1 8.1 7.2  RBC 3.58* 3.64* 3.72*  HGB 11.4* 11.6* 11.9*  HCT 34.1* 35.0* 36.1  MCV 95.3 96.2 97.0  MCH 31.8 31.9 32.0  MCHC 33.4 33.1 33.0  RDW 14.2 14.3 14.7  PLT 322 400 454*   Thyroid  Recent Labs  Lab 01/18/22 2216  TSH 1.746  FREET4 1.02    BNP Recent Labs  Lab 01/22/22 0222  BNP 1,960.2*    DDimer No results for input(s): "DDIMER" in the last 168 hours.   Radiology    No results found.  Cardiac Studies   Echo 01/19/22 1. Left ventricular ejection fraction, by estimation, is 30 to 35%. The  left ventricle has moderately decreased function. The left ventricle  demonstrates global hypokinesis. Left ventricular diastolic function could  not be evaluated.   2. Right ventricular systolic function is mildly reduced. The right  ventricular size is normal. There is normal pulmonary artery systolic  pressure. The estimated right ventricular systolic pressure is 74.9 mmHg.   3. Left atrial size was mildly dilated.   4. The mitral valve is grossly normal. Mild  mitral valve regurgitation.  No evidence of mitral stenosis.   5. The aortic valve is tricuspid. There is mild calcification of the  aortic valve. There is mild thickening of the aortic valve. Aortic valve  regurgitation is not visualized. Aortic valve sclerosis is present, with  no evidence of aortic valve stenosis.   6. The inferior vena cava is normal in size with <50% respiratory  variability, suggesting right atrial pressure of 8 mmHg.   Comparison(s): Changes from prior study are noted. The left ventricular  function is worsened.   Patient Profile     71 y.o. female with PMH persistent afib, LBBB who presented with hip fracture. Cardiology initially consulted for preoperative evaluation, echo showed further reduction in LVEF from 45-50% to 30-35%.  Assessment & Plan    Atrial fibrillation, persistent -underwent TEE-CV 7/12, followed by return of afib -now on amiodarone, continue 200 mg BID for 7 days, then 200 mg daily -CHA2DS2/VAS Stroke Risk Points =3  -continue apixaban 5 mg BID -rates still elevated despite amiodarone and metoprolol succinate. She is asymptomatic. My concern is that with PT post op they will be limited due to her heart rate. Blood pressure is borderline, but will try increasing dose of metoprolol to 100 mg BID to improve rate control  Acute reduction in LVEF, prior HFmrEF, now HFrEF consistent with acute systolic dysfunction without evidence of clinical heart failure -amiodarone as above -continue empagliflozin, metoprolol succinate, entresto, spironolactone -recheck echo as outpatient, if remains reduced consider ischemic evaluation  Right femoral neck fracture 2/2 fall, now s/p hip arthroplasty 7/9 -per primary team and orthopedics     For questions or updates, please contact [place] Please consult [email or web address] for contact info under        Signed, Buford Dresser, MD  01/25/2022, 11:36 AM

## 2022-01-25 NOTE — Progress Notes (Signed)
PROGRESS NOTE  Rhonda Marshall  DOB: Dec 20, 1950  PCP: Timoteo Gaul, FNP PJK:[id number]  DOA: 01/18/2022  LOS: 7 days  Hospital Day: 8  Brief narrative: Rhonda Marshall is a 71 y.o. female with PMH significant for HTN, permanent A-fib on Eliquis, HTN, chronic systolic CHF (EF 80%), anxiety, rheumatoid arthritis, history of throat cancer. On 7/5, patient was in her garden, she caught her flip-flop on a root and fell causing right hip pain.  The pain persisted for 3 days and hence he presented to the ED on 7/8.   Imaging showed right femoral neck fracture. Admitted to hospitalist service.  Orthopedic consult obtained.. 7/9, underwent hemiarthroplasty. 7/12, underwent successful DC cardioversion for A-fib  Subjective: Patient was seen and examined this morning.   Lying on bed.  Not in distress.  No new symptoms.  Had a good night sleep.  Remains in A-fib.  Heart rate most less than 100 except for early this morning when she was due for her medicines.  But she tends to get to get tachycardic with any exertion.  Assessment and plan: Right femoral neck fracture -7/9, underwent hemiarthroplasty. -Pain control, DVT prophylaxis per orthopedics.  A-fib with RVR History of permanent A-fib  -s/p successful TEE/DC cardioversion on 7/12.  However heart rhythm reverted back to A-fib again on 7/13. -Cardiology follow-up appreciated.  Currently on metoprolol succinate 100 mg daily and amiodarone 200 mg po BID x 7 days, then taper to 200 mg daily.  Remains in A-fib.  Heart rate most less than 100 except for early this morning when she was due for her medicines.  But she tends to get to get tachycardic with any exertion.  Noted a plan from cardiology to uptitrate metoprolol today. -Continue to monitor on telemetry -CHADSVASC score more than 3.  Continue anticoagulation with Eliquis twice daily.  Chronic systolic CHF Presumed nonischemic cardiomyopathy Essential hypertension -Echo with EF 30 to  35% with global hypokinesis which is less than 45 to 50% in 2020.  -Remains euvolemic. -CHF regimen: Metoprolol succinate, Entresto 24-26 mg twice daily.  Today, cardiology added Aldactone 25 mg daily and Jardiance 10 mg daily.  Not on Lasix yet.   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  SNF recommended by PT Skin assessment:     Nutritional status:  Body mass index is 22.74 kg/m.          Diet:  Diet Order             Diet Heart Room service appropriate? Yes; Fluid consistency: Thin  Diet effective now                   DVT prophylaxis:  SCDs Start: 01/19/22 2140 apixaban (ELIQUIS) tablet 5 mg   Antimicrobials: None Fluid: None Consultants: Cardiology, orthopedics Family Communication: None at bedside  Status is: Inpatient  Continue in-hospital care because: A-fib continues to remain uncontrolled Level of care: Progressive   Dispo: The patient is from: Home              Anticipated d/c is to: SNF after cardiology clearance  Patient currently is not medically stable to d/c. Difficult to place patient No     Infusions:     Scheduled Meds:  amiodarone  200 mg Oral BID   apixaban  5 mg Oral BID   Chlorhexidine Gluconate Cloth  6 each Topical Daily   docusate sodium  100 mg Oral BID   empagliflozin  10 mg Oral Daily   ferrous sulfate  325 mg Oral TID PC   metoprolol succinate  100 mg Oral Daily   propylthiouracil  50 mg Oral TID   sacubitril-valsartan  1 tablet Oral BID   spironolactone  25 mg Oral Daily    PRN meds: acetaminophen, ALPRAZolam, alum  & mag hydroxide-simeth, haloperidol lactate, HYDROmorphone (DILAUDID) injection, lip balm, loperamide, menthol-cetylpyridinium **OR** phenol, ondansetron **OR** ondansetron (ZOFRAN) IV, mouth rinse, oxyCODONE, oxyCODONE, polyethylene glycol   Antimicrobials: Anti-infectives (From admission, onward)    Start     Dose/Rate Route Frequency Ordered Stop   01/19/22 2330  ceFAZolin (ANCEF) IVPB 2g/100 mL premix        2 g 200 mL/hr over 30 Minutes Intravenous Every 6 hours 01/19/22 2139 01/20/22 0655   01/19/22 1609  ceFAZolin (ANCEF) 2-4 GM/100ML-% IVPB  Status:  Discontinued       Note to Pharmacy: Key, Kristopher: cabinet override      01/19/22 1609 01/19/22 1624   01/19/22 1603  ceFAZolin (ANCEF) 2-4 GM/100ML-% IVPB       Note to Pharmacy: Hassie Bruce A: cabinet override      01/19/22 1603 01/19/22 1733   01/19/22 0945  ceFAZolin (ANCEF) IVPB 2g/100 mL premix        2 g 200 mL/hr over 30 Minutes Intravenous On call to O.R. 01/19/22 0857 01/19/22 1720       Objective: Vitals:   01/25/22 0141 01/25/22 0538  BP: 108/78 131/66  Pulse: (!) 102 89  Resp: 16   Temp: 97.6 F (36.4 C) 98.2 F (36.8 C)  SpO2: 99% 99%    Intake/Output Summary (Last 24 hours) at 01/25/2022 1033 Last data filed at 01/25/2022 0544 Gross per 24 hour  Intake 360 ml  Output 1300 ml  Net -940 ml    Filed Weights   01/23/22 0455 01/24/22 0700 01/25/22 0538  Weight: 50.2 kg 51.2 kg 54.6 kg   Weight change: 3.4 kg Body mass index is 22.74 kg/m.   Physical Exam: General exam: Pleasant, elderly Caucasian female.  Not in physical distress Skin: No rashes, lesions or ulcers. HEENT: Atraumatic, normocephalic, no obvious bleeding Lungs: Clear to auscultation bilaterally CVS: A-fib with RVR, no murmur GI/Abd soft, nontender, nondistended, bowel sound present CNS: Alert, awake, oriented x3 Psychiatry: Mood appropriate Extremities: No pedal edema, no calf tenderness.  Data Review: I have personally  reviewed the laboratory data and studies available.  F/u labs ordered Unresulted Labs (From admission, onward)     Start     Ordered   01/21/22 0500  Magnesium  Daily at 5am,   R     Question:  Specimen collection method  Answer:  Lab=Lab collect   01/20/22 0824   01/21/22 0500  Phosphorus  Daily at 5am,   R     Question:  Specimen collection method  Answer:  Lab=Lab collect   01/20/22 0824   01/21/22 0500  CBC with Differential/Platelet  Daily at 5am,   R     Question:  Specimen collection method  Answer:  Lab=Lab collect   01/20/22 0824  Signed, Terrilee Croak, MD [facility] 01/25/2022

## 2022-01-26 DIAGNOSIS — I5189 Other ill-defined heart diseases: Secondary | ICD-10-CM

## 2022-01-26 DIAGNOSIS — S72001A Fracture of unspecified part of neck of right femur, initial encounter for closed fracture: Secondary | ICD-10-CM | POA: Diagnosis not present

## 2022-01-26 LAB — CBC WITH DIFFERENTIAL/PLATELET
Abs Immature Granulocytes: 0.08 10*3/uL — ABNORMAL HIGH (ref 0.00–0.07)
Basophils Absolute: 0.1 10*3/uL (ref 0.0–0.1)
Basophils Relative: 1 %
Eosinophils Absolute: 0.3 10*3/uL (ref 0.0–0.5)
Eosinophils Relative: 4 %
HCT: 38.6 % (ref 36.0–46.0)
Hemoglobin: 12.6 g/dL (ref 12.0–15.0)
Immature Granulocytes: 1 %
Lymphocytes Relative: 22 %
Lymphs Abs: 1.7 10*3/uL (ref 0.7–4.0)
MCH: 31.4 pg (ref 26.0–34.0)
MCHC: 32.6 g/dL (ref 30.0–36.0)
MCV: 96.3 fL (ref 80.0–100.0)
Monocytes Absolute: 1 10*3/uL (ref 0.1–1.0)
Monocytes Relative: 13 %
Neutro Abs: 4.7 10*3/uL (ref 1.7–7.7)
Neutrophils Relative %: 59 %
Platelets: 506 10*3/uL — ABNORMAL HIGH (ref 150–400)
RBC: 4.01 MIL/uL (ref 3.87–5.11)
RDW: 14.7 % (ref 11.5–15.5)
WBC: 7.8 10*3/uL (ref 4.0–10.5)
nRBC: 0 % (ref 0.0–0.2)

## 2022-01-26 LAB — PHOSPHORUS: Phosphorus: 4.4 mg/dL (ref 2.5–4.6)

## 2022-01-26 LAB — MAGNESIUM: Magnesium: 2.2 mg/dL (ref 1.7–2.4)

## 2022-01-26 NOTE — Progress Notes (Signed)
Progress Note  Patient Name: Rhonda Marshall Date of Encounter: 01/26/2022  [facility] Cardiologist: Minus Breeding, MD   Subjective   Feeling better today. Had some heart racing with moving around but better than it was before.  Inpatient Medications    Scheduled Meds:  amiodarone  200 mg Oral BID   apixaban  5 mg Oral BID   Chlorhexidine Gluconate Cloth  6 each Topical Daily   docusate sodium  100 mg Oral BID   empagliflozin  10 mg Oral Daily   ferrous sulfate  325 mg Oral TID PC   metoprolol succinate  100 mg Oral BID   propylthiouracil  50 mg Oral TID   sacubitril-valsartan  1 tablet Oral BID   spironolactone  25 mg Oral Daily   Continuous Infusions:  PRN Meds: acetaminophen, ALPRAZolam, alum & mag hydroxide-simeth, haloperidol lactate, HYDROmorphone (DILAUDID) injection, lip balm, loperamide, menthol-cetylpyridinium **OR** phenol, ondansetron **OR** ondansetron (ZOFRAN) IV, mouth rinse, oxyCODONE, oxyCODONE, polyethylene glycol   Vital Signs    Vitals:   01/25/22 0538 01/25/22 2114 01/26/22 0353 01/26/22 1002  BP: 131/66 131/83 110/74 (!) 114/57  Pulse: 89 (!) 102 83 85  Resp:  16 19   Temp: 98.2 F (36.8 C) 98.9 F (37.2 C) 98.9 F (37.2 C)   TempSrc: Oral Oral Oral   SpO2: 99% 99% 97% 94%  Weight: 54.6 kg  55.1 kg   Height:        Intake/Output Summary (Last 24 hours) at 01/26/2022 1026 Last data filed at 01/26/2022 0416 Gross per 24 hour  Intake 120 ml  Output 1600 ml  Net -1480 ml      01/26/2022    3:53 AM 01/25/2022    5:38 AM 01/24/2022    7:00 AM  Last 3 Weights  Weight (lbs) 121 lb 7.6 oz 120 lb 5.9 oz 112 lb 14 oz  Weight (kg) 55.1 kg 54.6 kg 51.2 kg      Telemetry    Atrial fibrillation, with heart rates 80s-110s- Personally Reviewed  ECG    No new since 7/12 - Personally Reviewed  Physical Exam   GEN: Well nourished, well developed in no acute distress HEENT: Normal, moist mucous membranes NECK: No JVD CARDIAC:  irregularly irregular rhythm, normal S1 and S2, no rubs or gallops. No murmur. VASCULAR: Radial and DP pulses 2+ bilaterally. No carotid bruits RESPIRATORY:  Clear to auscultation without rales, wheezing or rhonchi  ABDOMEN: Soft, non-tender, non-distended MUSCULOSKELETAL:  Ambulates independently SKIN: Warm and dry, no edema NEUROLOGIC:  Alert and oriented x 3. No focal neuro deficits noted. PSYCHIATRIC:  Normal affect    Labs    High Sensitivity Troponin:   Recent Labs  Lab 01/18/22 2216 01/19/22 0048 01/19/22 0249  TROPONINIHS 22* 22* 22*     Chemistry Recent Labs  Lab 01/23/22 0313 01/24/22 0247 01/25/22 0521 01/26/22 0419  NA 137 138 137  --   K 3.6 3.1* 3.5  --   CL 103 106 105  --   CO2 '25 25 22  '$ --   GLUCOSE 105* 97 90  --   BUN '13 13 16  '$ --   CREATININE 0.82 1.00 0.91  --   CALCIUM 8.7* 8.9 9.1  --   MG 2.0 2.1 2.0 2.2  PROT 5.9* 5.9* 6.3*  --   ALBUMIN 2.8* 2.8* 2.9*  --   AST '25 18 16  '$ --   ALT '14 12 13  '$ --   ALKPHOS 87 86 85  --  BILITOT 0.3 0.5 0.5  --   GFRNONAA >60 60* >60  --   ANIONGAP '9 7 10  '$ --     Lipids No results for input(s): "CHOL", "TRIG", "HDL", "LABVLDL", "LDLCALC", "CHOLHDL" in the last 168 hours.  Hematology Recent Labs  Lab 01/24/22 0247 01/25/22 0521 01/26/22 0419  WBC 8.1 7.2 7.8  RBC 3.64* 3.72* 4.01  HGB 11.6* 11.9* 12.6  HCT 35.0* 36.1 38.6  MCV 96.2 97.0 96.3  MCH 31.9 32.0 31.4  MCHC 33.1 33.0 32.6  RDW 14.3 14.7 14.7  PLT 400 454* 506*   Thyroid  No results for input(s): "TSH", "FREET4" in the last 168 hours.   BNP Recent Labs  Lab 01/22/22 0222  BNP 1,960.2*    DDimer No results for input(s): "DDIMER" in the last 168 hours.   Radiology    No results found.  Cardiac Studies   Echo 01/19/22 1. Left ventricular ejection fraction, by estimation, is 30 to 35%. The  left ventricle has moderately decreased function. The left ventricle  demonstrates global hypokinesis. Left ventricular diastolic function  could  not be evaluated.   2. Right ventricular systolic function is mildly reduced. The right  ventricular size is normal. There is normal pulmonary artery systolic  pressure. The estimated right ventricular systolic pressure is 16.0 mmHg.   3. Left atrial size was mildly dilated.   4. The mitral valve is grossly normal. Mild mitral valve regurgitation.  No evidence of mitral stenosis.   5. The aortic valve is tricuspid. There is mild calcification of the  aortic valve. There is mild thickening of the aortic valve. Aortic valve  regurgitation is not visualized. Aortic valve sclerosis is present, with  no evidence of aortic valve stenosis.   6. The inferior vena cava is normal in size with <50% respiratory  variability, suggesting right atrial pressure of 8 mmHg.   Comparison(s): Changes from prior study are noted. The left ventricular  function is worsened.   Patient Profile     71 y.o. female with PMH persistent afib, LBBB who presented with hip fracture. Cardiology initially consulted for preoperative evaluation, echo showed further reduction in LVEF from 45-50% to 30-35%.  Assessment & Plan    Atrial fibrillation, persistent -underwent TEE-CV 7/12, followed by return of afib -now on amiodarone, continue 200 mg BID for 7 days, then 200 mg daily -CHA2DS2/VAS Stroke Risk Points =3  -continue apixaban 5 mg BID -rates improved with uptitration of metoprolol, continue metoprolol succinate 100 mg BID  Acute reduction in LVEF, prior HFmrEF, now HFrEF consistent with acute systolic dysfunction without evidence of clinical heart failure -amiodarone as above -continue empagliflozin, metoprolol succinate, entresto, spironolactone -recheck echo as outpatient, if remains reduced consider ischemic evaluation  Right femoral neck fracture 2/2 fall, now s/p hip arthroplasty 7/9 -per primary team and orthopedics  [facility] will sign off.   Medication Recommendations:  medications as  currently ordered, with amiodarone to drop to 200 mg daily after total of 7 days twice daily dosing Other recommendations (labs, testing, etc):  none Follow up as an outpatient:  We will arrange for outpatient follow up with Dr. Percival Spanish or a member of his team.      For questions or updates, please contact [facility] Please consult [email or web address] for contact info under        Signed, Buford Dresser, MD  01/26/2022, 10:26 AM

## 2022-01-26 NOTE — Progress Notes (Signed)
PROGRESS NOTE  [name]  DOB: Nov 09, 1950  PCP: Timoteo Gaul, FNP WHQ:[id number]  DOA: 01/18/2022  LOS: 8 days  Hospital Day: 9  Brief narrative: Rhonda Marshall is a 71 y.o. female with PMH significant for HTN, permanent A-fib on Eliquis, HTN, chronic systolic CHF (EF 65%), anxiety, rheumatoid arthritis, history of throat cancer. On 7/5, patient was in her garden, she caught her flip-flop on a root and fell causing right hip pain.  The pain persisted for 3 days and hence he presented to the ED on 7/8.   Imaging showed right femoral neck fracture. Admitted to hospitalist service.  Orthopedic consult obtained.. 7/9, underwent hemiarthroplasty. 7/12, underwent successful DC cardioversion for A-fib  Subjective: Patient was seen and examined this morning.   Lying on bed.  Not in distress.   Tachycardia improving at rest but persist with exertion.    Assessment and plan: Right femoral neck fracture -7/9, underwent hemiarthroplasty. -Pain control, per orthopedics.  On Eliquis  A-fib with RVR History of permanent A-fib  -s/p successful TEE/DC cardioversion on 7/12.  However heart rhythm reverted back to A-fib again on 7/13. -Per cardiology recommendation, patient is currently in metoprolol succinate 100 mg twice daily, amiodarone 200 mg po BID x 7 days, will plan to taper to 200 mg daily.  Remains in A-fib.  Heart rate gradually improving.   -Continue to monitor on telemetry -CHADSVASC score more than 3.  Continue anticoagulation with Eliquis twice daily.  Chronic systolic CHF Presumed nonischemic cardiomyopathy Essential hypertension -Echo with EF 30 to 35% with global hypokinesis which is less than 45 to 50% in 2020.  -Remains euvolemic. -CHF regimen: Metoprolol succinate 100 mg twice daily, Entresto 24-26 mg twice daily.  Today, cardiology added Aldactone 25 mg daily and Jardiance 10 mg daily.  Not on Lasix yet.   Rheumatoid arthritis not on medication -Outpatient  follow-up with primary physician   Prior toxic nodular goiter -TSH normal -Continue propylthiouracil 50 milligram 3 times daily  History of throat cancer -s/p prior history of radiation   Bipolar vs personality disorder Anxiety disorder -Currently on Xanax, Ativan as needed, Haldol as needed   Goals of care   Code Status: Full Code    Mobility: Encourage ambulation.  SNF recommended by PT Skin assessment:     Nutritional status:  Body mass index is 22.95 kg/m.          Diet:  Diet Order             Diet Heart Room service appropriate? Yes; Fluid consistency: Thin  Diet effective now                   DVT prophylaxis:  SCDs Start: 01/19/22 2140 apixaban (ELIQUIS) tablet 5 mg   Antimicrobials: None Fluid: None Consultants: Cardiology, orthopedics Family Communication: None at bedside  Status is: Inpatient  Continue in-hospital care because: Discharge to SNF likely tomorrow Level of care: Progressive   Dispo: The patient is from: Home              Anticipated d/c is to: SNF likely tomorrow              Patient currently is not medically stable to d/c. Difficult to place patient No     Infusions:     Scheduled Meds:  amiodarone  200 mg Oral BID   apixaban  5 mg Oral BID   Chlorhexidine Gluconate Cloth  6 each Topical Daily   docusate sodium  100  mg Oral BID   empagliflozin  10 mg Oral Daily   ferrous sulfate  325 mg Oral TID PC   metoprolol succinate  100 mg Oral BID   propylthiouracil  50 mg Oral TID   sacubitril-valsartan  1 tablet Oral BID   spironolactone  25 mg Oral Daily    PRN meds: acetaminophen, ALPRAZolam, alum & mag hydroxide-simeth, haloperidol lactate, HYDROmorphone (DILAUDID) injection, lip balm, loperamide, menthol-cetylpyridinium **OR** phenol, ondansetron **OR** ondansetron (ZOFRAN) IV, mouth rinse, oxyCODONE, oxyCODONE, polyethylene glycol   Antimicrobials: Anti-infectives (From admission, onward)    Start      Dose/Rate Route Frequency Ordered Stop   01/19/22 2330  ceFAZolin (ANCEF) IVPB 2g/100 mL premix        2 g 200 mL/hr over 30 Minutes Intravenous Every 6 hours 01/19/22 2139 01/20/22 0655   01/19/22 1609  ceFAZolin (ANCEF) 2-4 GM/100ML-% IVPB  Status:  Discontinued       Note to Pharmacy: Key, Kristopher: cabinet override      01/19/22 1609 01/19/22 1624   01/19/22 1603  ceFAZolin (ANCEF) 2-4 GM/100ML-% IVPB       Note to Pharmacy: Hassie Bruce A: cabinet override      01/19/22 1603 01/19/22 1733   01/19/22 0945  ceFAZolin (ANCEF) IVPB 2g/100 mL premix        2 g 200 mL/hr over 30 Minutes Intravenous On call to O.R. 01/19/22 0857 01/19/22 1720       Objective: Vitals:   01/26/22 0353 01/26/22 1002  BP: 110/74 (!) 114/57  Pulse: 83 85  Resp: 19 17  Temp: 98.9 F (37.2 C) (!) 97.4 F (36.3 C)  SpO2: 97% 94%    Intake/Output Summary (Last 24 hours) at 01/26/2022 1341 Last data filed at 01/26/2022 0416 Gross per 24 hour  Intake 120 ml  Output 1600 ml  Net -1480 ml    Filed Weights   01/24/22 0700 01/25/22 0538 01/26/22 0353  Weight: 51.2 kg 54.6 kg 55.1 kg   Weight change: 0.5 kg Body mass index is 22.95 kg/m.   Physical Exam: General exam: Pleasant, elderly Caucasian female.  Not in physical distress Skin: No rashes, lesions or ulcers. HEENT: Atraumatic, normocephalic, no obvious bleeding Lungs: Clear to auscultation bilaterally CVS: Irregular heart rhythm, episodic tachycardia, no murmur GI/Abd soft, nontender, nondistended, bowel sound present CNS: Alert, awake, oriented x3 Psychiatry: Mood appropriate Extremities: No pedal edema, no calf tenderness.  Data Review: I have personally reviewed the laboratory data and studies available.  F/u labs ordered Unresulted Labs (From admission, onward)     Start     Ordered   01/21/22 0500  Magnesium  Daily at 5am,   R     Question:  Specimen collection method  Answer:  Lab=Lab collect   01/20/22 0824   01/21/22  0500  Phosphorus  Daily at 5am,   R     Question:  Specimen collection method  Answer:  Lab=Lab collect   01/20/22 0824   01/21/22 0500  CBC with Differential/Platelet  Daily at 5am,   R     Question:  Specimen collection method  Answer:  Lab=Lab collect   01/20/22 4098            Signed, Terrilee Croak, MD [facility] 01/26/2022

## 2022-01-26 NOTE — Progress Notes (Signed)
     Subjective:  Patient reports pain as mild.  She is motivated, and wants to ambulate more.  Pain in the groin is relatively minor.  Objective:   VITALS:   Vitals:   01/25/22 0538 01/25/22 2114 01/26/22 0353 01/26/22 1002  BP: 131/66 131/83 110/74 (!) 114/57  Pulse: 89 (!) 102 83 85  Resp:  '16 19 17  '$ Temp: 98.2 F (36.8 C) 98.9 F (37.2 C) 98.9 F (37.2 C) (!) 97.4 F (36.3 C)  TempSrc: Oral Oral Oral Oral  SpO2: 99% 99% 97% 94%  Weight: 54.6 kg  55.1 kg   Height:        Neurologically intact Dorsiflexion/Plantar flexion intact Incision: scant drainage  Lab Results  Component Value Date   WBC 7.8 01/26/2022   HGB 12.6 01/26/2022   HCT 38.6 01/26/2022   MCV 96.3 01/26/2022   PLT 506 (H) 01/26/2022   BMET    Component Value Date/Time   NA 137 01/25/2022 0521   K 3.5 01/25/2022 0521   CL 105 01/25/2022 0521   CO2 22 01/25/2022 0521   GLUCOSE 90 01/25/2022 0521   BUN 16 01/25/2022 0521   CREATININE 0.91 01/25/2022 0521   CREATININE 0.92 10/20/2019 1311   CREATININE 0.73 07/26/2014 1441   CALCIUM 9.1 01/25/2022 0521   GFRNONAA >60 01/25/2022 0521   GFRNONAA >60 10/20/2019 1311     Assessment/Plan: 4 Days Post-Op   Principal Problem:   Hip fracture (HCC) Active Problems:   Longstanding persistent atrial fibrillation (HCC)   Rheumatoid arthritis (HCC)   Chronic systolic CHF (congestive heart failure) (HCC)   Hyperthyroidism   Malignant neoplasm of glottis (HCC)   Dehydration   Elevated troponin   Hypokalemia   LBBB (left bundle branch block)   Advance diet Up with therapy Okay for anticoagulation with Eliquis, weight-bear as tolerated, plan for follow-up with me in 2 weeks after discharge.   Johnny Bridge 01/26/2022, 8:35 PM   Marchia Bond, MD Cell [phone]

## 2022-01-26 NOTE — TOC Progression Note (Signed)
Transition of Care [facility]) - Progression Note    Patient Details  Name: Micaella Gitto MRN: [id number] Date of Birth: 03-19-1951  Transition of Care [facility]) CM/SW Contact  Urbano Milhouse, Juliann Pulse, RN Phone Number: 01/26/2022, 3:24 PM  Clinical Narrative: Noted auth until 7/18 [employer] auth [id number];QHUT Josem Kaufmann ID #[id number] for [facility]-await medical stability. Spoke to [employer]) in agreement.      Expected Discharge Plan: [place] Barriers to Discharge: Continued Medical Work up  Expected Discharge Plan and Services Expected Discharge Plan: [place]   Discharge Planning Services: CM Consult   Living arrangements for the past 2 months: Single Family Home                                       Social Determinants of Health (SDOH) Interventions    Readmission Risk Interventions     No data to display

## 2022-01-27 ENCOUNTER — Ambulatory Visit: Payer: 59 | Admitting: Internal Medicine

## 2022-01-27 LAB — CBC WITH DIFFERENTIAL/PLATELET
Abs Immature Granulocytes: 0.07 10*3/uL (ref 0.00–0.07)
Basophils Absolute: 0.1 10*3/uL (ref 0.0–0.1)
Basophils Relative: 1 %
Eosinophils Absolute: 0.2 10*3/uL (ref 0.0–0.5)
Eosinophils Relative: 3 %
HCT: 39.6 % (ref 36.0–46.0)
Hemoglobin: 12.8 g/dL (ref 12.0–15.0)
Immature Granulocytes: 1 %
Lymphocytes Relative: 23 %
Lymphs Abs: 1.7 10*3/uL (ref 0.7–4.0)
MCH: 31.8 pg (ref 26.0–34.0)
MCHC: 32.3 g/dL (ref 30.0–36.0)
MCV: 98.3 fL (ref 80.0–100.0)
Monocytes Absolute: 1 10*3/uL (ref 0.1–1.0)
Monocytes Relative: 13 %
Neutro Abs: 4.3 10*3/uL (ref 1.7–7.7)
Neutrophils Relative %: 59 %
Platelets: 497 10*3/uL — ABNORMAL HIGH (ref 150–400)
RBC: 4.03 MIL/uL (ref 3.87–5.11)
RDW: 14.7 % (ref 11.5–15.5)
WBC: 7.3 10*3/uL (ref 4.0–10.5)
nRBC: 0 % (ref 0.0–0.2)

## 2022-01-27 LAB — PHOSPHORUS: Phosphorus: 4.5 mg/dL (ref 2.5–4.6)

## 2022-01-27 LAB — MAGNESIUM: Magnesium: 2.1 mg/dL (ref 1.7–2.4)

## 2022-01-27 MED ORDER — METOPROLOL SUCCINATE ER 100 MG PO TB24: 100.0000 mg | ORAL_TABLET | Freq: Two times a day (BID) | ORAL | Status: DC

## 2022-01-27 MED ORDER — AMIODARONE HCL 200 MG PO TABS: ORAL_TABLET | ORAL | Status: DC

## 2022-01-27 MED ORDER — SACUBITRIL-VALSARTAN 24-26 MG PO TABS: 1.0000 | ORAL_TABLET | Freq: Two times a day (BID) | ORAL | Status: DC

## 2022-01-27 MED ORDER — SPIRONOLACTONE 25 MG PO TABS: 25.0000 mg | ORAL_TABLET | Freq: Every day | ORAL | Status: DC

## 2022-01-27 MED ORDER — EMPAGLIFLOZIN 10 MG PO TABS: 10.0000 mg | ORAL_TABLET | Freq: Every day | ORAL | Status: DC

## 2022-01-27 MED ORDER — ALPRAZOLAM 1 MG PO TABS: 1.0000 mg | ORAL_TABLET | Freq: Three times a day (TID) | ORAL | 0 refills | Status: AC | PRN

## 2022-01-27 NOTE — Progress Notes (Signed)
Called report to [place] at [facility]. Pt leaving now via PTAR.

## 2022-01-27 NOTE — Progress Notes (Signed)
Physical Therapy Treatment Patient Details Name: Rhonda Marshall MRN: [id number] DOB: 1951-02-25 Today's Date: 01/27/2022   History of Present Illness 71 y.o. female with medical history significant of A-fib on Eliquis  Hypothyroidism bilateral carotid bruit, rheumatoid arthritis, LBBB, systolic CHF EF 48%  Hx of throat cancer.  Presented with right hip pain, Dx of R femoral neck fx, s/p R hip hemiarthroplasty 01/19/22. Onset afib, Cardio version 7/13.    PT Comments    POD # 8 R Hemi + TEE on 7/12 General Comments: AxO x 4 very pleasant Lady who fell while vacationing at the [place] with her family.  Son brought her back to [place] then went to see MD. Pt already OOB in recliner.  General transfer comment: VCs for UE and LE placement for safety, min A to power up   also assisted on/off BSC.  General Gait Details: 25% VC's on safety with turns and back steps to BSC/recliner.  Slightly unsteady and impulsive. HIGH FALL RISK. Pt will need ST Rehab at SNF to address her decline in functional mobility and regain her Indep prior to safely returning home.    Recommendations for follow up therapy are one component of a multi-disciplinary discharge planning process, led by the attending physician.  Recommendations may be updated based on patient status, additional functional criteria and insurance authorization.  Follow Up Recommendations  Skilled nursing-short term rehab (<3 hours/day) Can patient physically be transported by private vehicle: Yes   Assistance Recommended at Discharge Intermittent Supervision/Assistance  Patient can return home with the following A little help with walking and/or transfers;A little help with bathing/dressing/bathroom;Assistance with cooking/housework;Assist for transportation;Help with stairs or ramp for entrance   Equipment Recommendations  Rolling walker (2 wheels)    Recommendations for Other Services       Precautions / Restrictions  Precautions Precautions: Fall Precaution Comments: monitor HR Restrictions Weight Bearing Restrictions: No RLE Weight Bearing: Weight bearing as tolerated     Mobility  Bed Mobility               General bed mobility comments: OOB in recliner    Transfers Overall transfer level: Needs assistance Equipment used: Rolling walker (2 wheels) Transfers: Sit to/from Stand Sit to Stand: Min guard, Min assist           General transfer comment: VCs for UE and LE placement for safety, min A to power up   also assisted on/off BSC    Ambulation/Gait Ambulation/Gait assistance: Supervision, Min guard Gait Distance (Feet): 36 Feet Assistive device: Rolling walker (2 wheels) Gait Pattern/deviations: Step-to pattern, Decreased step length - right, Decreased step length - left Gait velocity: decreased     General Gait Details: 25% VC's on safety with turns and back steps to BSC/recliner.  Slightly unsteady and impulsive.   Stairs             Wheelchair Mobility    Modified Rankin (Stroke Patients Only)       Balance                                            Cognition Arousal/Alertness: Awake/alert Behavior During Therapy: WFL for tasks assessed/performed Overall Cognitive Status: Within Functional Limits for tasks assessed  General Comments: AxO x 4 very pleasant Lady who fell while vacationing at the [place] with her family.  Son brought her back to [facility] then went to see MD.        Exercises      General Comments        Pertinent Vitals/Pain Pain Assessment Pain Assessment: 0-10 Pain Score: 4  Pain Location: R hip with movement Pain Descriptors / Indicators: Guarding, Grimacing Pain Intervention(s): Monitored during session, Repositioned    Home Living                          Prior Function            PT Goals (current goals can now be found in the care plan  section) Progress towards PT goals: Progressing toward goals    Frequency    Min 3X/week      PT Plan Current plan remains appropriate    Co-evaluation              AM-PAC PT "6 Clicks" Mobility   Outcome Measure  Help needed turning from your back to your side while in a flat bed without using bedrails?: A Little Help needed moving from lying on your back to sitting on the side of a flat bed without using bedrails?: A Little Help needed moving to and from a bed to a chair (including a wheelchair)?: A Little Help needed standing up from a chair using your arms (e.g., wheelchair or bedside chair)?: A Lot Help needed to walk in hospital room?: A Lot Help needed climbing 3-5 steps with a railing? : A Lot 6 Click Score: 15    End of Session Equipment Utilized During Treatment: Gait belt Activity Tolerance: Patient tolerated treatment well Patient left: in chair;with call bell/phone within reach;with chair alarm set Nurse Communication: Mobility status PT Visit Diagnosis: Difficulty in walking, not elsewhere classified (R26.2);Pain;Muscle weakness (generalized) (M62.81) Pain - Right/Left: Right Pain - part of body: Hip     Time: 8546-2703 PT Time Calculation (min) (ACUTE ONLY): 24 min  Charges:  $Gait Training: 8-22 mins $Therapeutic Activity: 8-22 mins                     Rica Koyanagi  PTA [place] Office M-F          [phone] Weekend pager [phone]

## 2022-01-27 NOTE — TOC Transition Note (Signed)
Transition of Care [facility]) - CM/SW Discharge Note   Patient Details  Name: Rhonda Marshall MRN: [id number] Date of Birth: Jun 18, 1951  [facility] of Care [facility]) CM/SW Contact:  Dessa Phi, RN Phone Number: 01/27/2022, 12:59 PM   Clinical Narrative:    PTAR called. [facility] [place] [id number],report tel#[phone].     Barriers to Discharge: No Barriers Identified   Patient Goals and CMS Choice Patient states their goals for this hospitalization and ongoing recovery are:: to get better and go home CMS Medicare.gov Compare Post Acute Care list provided to:: Patient    Discharge Placement                       Discharge Plan and Services   Discharge Planning Services: CM Consult                                 Social Determinants of Health (SDOH) Interventions     Readmission Risk Interventions     No data to display

## 2022-01-27 NOTE — Plan of Care (Signed)
  Problem: Activity: Goal: Risk for activity intolerance will decrease Outcome: Adequate for Discharge   Problem: Nutrition: Goal: Adequate nutrition will be maintained Outcome: Adequate for Discharge   Problem: Pain Managment: Goal: General experience of comfort will improve Outcome: Adequate for Discharge   

## 2022-01-27 NOTE — Discharge Summary (Signed)
Physician Discharge Summary  Shanikqua Zarzycki VWP:[id number] DOB: March 15, 1951 DOA: 01/18/2022  PCP: Timoteo Gaul, FNP  Admit date: 01/18/2022 Discharge date: 01/27/2022  Admitted From: Home Discharge disposition: SNF  Brief narrative: Rhonda Marshall is a 71 y.o. female with PMH significant for HTN, permanent A-fib on Eliquis, HTN, chronic systolic CHF (EF 37%), anxiety, rheumatoid arthritis, history of throat cancer. On 7/5, patient was in her garden, she caught her flip-flop on a root and fell causing right hip pain.  The pain persisted for 3 days and hence he presented to the ED on 7/8.   Imaging showed right femoral neck fracture. Admitted to hospitalist service.  Orthopedic consult obtained.. 7/9, underwent hemiarthroplasty. 7/12, underwent successful DC cardioversion for A-fib  Subjective: Patient was seen and examined this morning.   Lying on bed.  Not in distress.   Tachycardia improving.  Hospital course: Right femoral neck fracture -7/9, underwent hemiarthroplasty. -Pain control, per orthopedics.  On Eliquis  A-fib with RVR History of permanent A-fib  -s/p successful TEE/DC cardioversion on 7/12.  However heart rhythm reverted back to A-fib again on 7/13. -Per cardiology recommendation, patient is currently in metoprolol succinate 100 mg twice daily, amiodarone 200 mg po BID x 7 days, with plan to taper to 200 mg daily starting 01/30/2022.  Remains in A-fib but rate controlled.  Heart rate gradually improving after metoprolol dose was increased 2 days ago -Continue to monitor on telemetry -CHADSVASC score more than 3.  Continue anticoagulation with Eliquis twice daily.  Chronic systolic CHF Presumed nonischemic cardiomyopathy Essential hypertension -Echo with EF 30 to 35% with global hypokinesis which is less than 45 to 50% in 2020.  -Remains euvolemic. -CHF regimen: Metoprolol succinate 100 mg twice daily, Entresto 24-26 mg twice daily, Aldactone 25 mg daily and  Jardiance 10 mg daily.  Not on Lasix yet.   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     Incision (Closed) 01/19/22 Hip Right (Active)  Date First Assessed/Time First Assessed: 01/19/22 1815   Location: Hip  Location Orientation: Right    Assessments 01/19/2022  6:45 PM 01/27/2022  8:00 AM  Dressing Type Hydrocolloid Foam - Lift dressing to assess site every shift  Dressing Clean, Dry, Intact --  Site / Wound Assessment -- Dressing in place / Unable to assess  Treatment Ice applied --     No associated orders.    Discharge Exam:   Vitals:   01/26/22 2042 01/27/22 0403 01/27/22 0417 01/27/22 0430  BP: 115/73 (!) 89/70  (!) 112/56  Pulse: 89 80  63  Resp: 16 16    Temp: 98.3 F (36.8 C) 98.1 F (36.7 C)    TempSrc: Oral Oral    SpO2: 99% 99%    Weight:   44.5 kg   Height:        Body mass index is 18.54 kg/m.  General exam: Pleasant, elderly Caucasian female.  Not in physical  distress Skin: No rashes, lesions or ulcers. HEENT: Atraumatic, normocephalic, no obvious bleeding Lungs: Clear to auscultation bilaterally CVS: Irregular heart rhythm, episodic tachycardia, no murmur GI/Abd soft, nontender, nondistended, bowel sound present CNS: Alert, awake, oriented x3 Psychiatry: Mood appropriate Extremities: No pedal edema, no calf tenderness.  Follow ups:    Contact information  for follow-up providers     Marchia Bond, MD. Schedule an appointment as soon as possible for a visit in 2 week(s).   Specialty: Orthopedic Surgery Contact information: [address] [place] [address] [phone]              Contact information for after-discharge care     Destination     [facility] Preferred SNF .   Service: Skilled Nursing Contact information: [place] [phone]                     Discharge Instructions:   Discharge Instructions     Call MD for:  difficulty breathing, headache or visual disturbances   Complete by: As directed    Call MD for:  extreme fatigue   Complete by: As directed    Call MD for:  hives   Complete by: As directed    Call MD for:  persistant dizziness or light-headedness   Complete by: As directed    Call MD for:  persistant nausea and vomiting   Complete by: As directed    Call MD for:  severe uncontrolled pain   Complete by: As directed    Call MD for:  temperature >100.4   Complete by: As directed    Diet - low sodium heart healthy   Complete by: As directed    Discharge instructions   Complete by: As directed    Discharge instructions for CHF Check weight daily -preferably same time every day. Restrict fluid intake to 1200 ml daily Restrict salt intake to less than 2 g daily. Call MD if you have one of the following symptoms 1) 3 pound weight gain in 24 hours or 5 pounds in 1 week  2) swelling in the hands, feet or stomach  3) progressive shortness of breath 4) if you have to sleep on extra pillows at night in order to breathe   Discharge wound care:   Complete by: As directed    Increase activity slowly   Complete by: As directed        Discharge Medications:   Allergies as of 01/27/2022       Reactions   Clindamycin/lincomycin Rash   Latex Rash   Latex gloves        Medication List      STOP taking these medications    diltiazem 240 MG 24 hr capsule Commonly known as: CARDIZEM CD   naloxone 4 MG/0.1ML Liqd nasal spray kit Commonly known as: NARCAN       TAKE these medications    acetaminophen 325 MG tablet Commonly known as: TYLENOL Take 1-2 tablets (325-650 mg total) by mouth every 6 (six) hours as needed for mild pain (pain score 1-3 or temp > 100.5).   ALPRAZolam 1 MG tablet Commonly known as: XANAX Take 1 tablet (1 mg total) by mouth 3 (three) times daily as needed for up to 3 days for anxiety.   amiodarone 200 MG tablet Commonly known as: PACERONE 200 mg twice a day for 7 days to complete on 7/19.  Start 200 mg  once a day starting 7/20.   apixaban 5 MG Tabs tablet Commonly known as: Eliquis Take 1 tablet (5 mg total) by mouth 2 (two) times daily. NEEDS LABS, PLEASE COME TO OFFICE ASAP   docusate sodium 100 MG capsule Commonly known as: COLACE Take 1 capsule (100 mg total) by mouth 2 (two) times daily.   empagliflozin 10 MG Tabs tablet Commonly known as: JARDIANCE Take 1 tablet (10 mg total) by mouth daily. Start taking on: January 28, 2022   ferrous sulfate 325 (65 FE) MG tablet Take 1 tablet (325 mg total) by mouth 3 (three) times daily after meals.   metoprolol succinate 100 MG 24 hr tablet Commonly known as: TOPROL-XL Take 1 tablet (100 mg total) by mouth 2 (two) times daily. Take with or immediately following a meal. What changed:  medication strength how much to take when to take this additional instructions   multivitamin with minerals Tabs tablet Take 1 tablet by mouth daily.   oxyCODONE-acetaminophen 10-325 MG tablet Commonly known as: PERCOCET Take 1 tablet by mouth every 4 (four) hours as needed for pain. What changed: when to take this   polyethylene glycol 17 g packet Commonly known as: MIRALAX / GLYCOLAX Take 17 g by mouth daily as needed for mild constipation.   propylthiouracil 50 MG tablet Commonly known as:  PTU Take 1 tablet (50 mg total) by mouth 3 (three) times daily.   sacubitril-valsartan 24-26 MG Commonly known as: ENTRESTO Take 1 tablet by mouth 2 (two) times daily.   spironolactone 25 MG tablet Commonly known as: ALDACTONE Take 1 tablet (25 mg total) by mouth daily. Start taking on: January 28, 2022   Vitamin D (Ergocalciferol) 1.25 MG (50000 UNIT) Caps capsule Commonly known as: DRISDOL Take 50,000 Units by mouth once a week.               Discharge Care Instructions  (From admission, onward)           Start     Ordered   01/27/22 0000  Discharge wound care:        01/27/22 1141             The results of significant diagnostics from this hospitalization (including imaging, microbiology, ancillary and laboratory) are listed below for reference.    Procedures and Diagnostic Studies:   Pelvis Portable  Result Date: 01/19/2022 CLINICAL DATA:  Status post right hip hemiarthroplasty EXAM: PORTABLE PELVIS 1-2 VIEWS COMPARISON:  01/18/2022 FINDINGS: Right hip hemiarthroplasty is now seen. Pelvic ring is intact. Degenerative changes of the lumbar spine are noted. IMPRESSION: Status post right hip hemiarthroplasty. Electronically Signed   By: Inez Catalina M.D.   On: 01/19/2022 19:54   DG Hip Port Unilat With Pelvis 1V Right  Result Date: 01/19/2022 CLINICAL DATA:  Status post right hip replacement EXAM: DG HIP (WITH OR WITHOUT PELVIS) 1V PORT RIGHT COMPARISON:  01/18/2022 FINDINGS: Hip prosthesis is now seen. No acute fracture or dislocation is noted. IMPRESSION: Status post right hip replacement Electronically Signed   By: Inez Catalina M.D.   On: 01/19/2022 19:52   ECHOCARDIOGRAM COMPLETE  Result Date: 01/19/2022    ECHOCARDIOGRAM REPORT   Patient Name:   LYNA LANINGHAM Date of Exam: 01/19/2022 Medical Rec #:  [id number]        Height:       61.0 in Accession #:    [id number]       Weight:       110.9 lb Date  of Birth:  [birth year]        BSA:          1.470 m Patient  Age:    77 years         BP:           127/87 mmHg Patient Gender: F                HR:           114 bpm. Exam Location:  Inpatient Procedure: 2D Echo, Cardiac Doppler and Color Doppler Indications:    Cardiomegaly I51.7  History:        Patient has prior history of Echocardiogram examinations, most                 recent 05/16/2019. Arrythmias:Atrial Fibrillation; Risk                 Factors:Hypertension.  Sonographer:    Bernadene Person RDCS Referring Phys: [place]  1. Left ventricular ejection fraction, by estimation, is 30 to 35%. The left ventricle has moderately decreased function. The left ventricle demonstrates global hypokinesis. Left ventricular diastolic function could not be evaluated.  2. Right ventricular systolic function is mildly reduced. The right ventricular size is normal. There is normal pulmonary artery systolic pressure. The estimated right ventricular systolic pressure is 99.8 mmHg.  3. Left atrial size was mildly dilated.  4. The mitral valve is grossly normal. Mild mitral valve regurgitation. No evidence of mitral stenosis.  5. The aortic valve is tricuspid. There is mild calcification of the aortic valve. There is mild thickening of the aortic valve. Aortic valve regurgitation is not visualized. Aortic valve sclerosis is present, with no evidence of aortic valve stenosis.  6. The inferior vena cava is normal in size with <50% respiratory variability, suggesting right atrial pressure of 8 mmHg. Comparison(s): Changes from prior study are noted. The left ventricular function is worsened. FINDINGS  Left Ventricle: Left ventricular ejection fraction, by estimation, is 30 to 35%. The left ventricle has moderately decreased function. The left ventricle demonstrates global hypokinesis. The left ventricular internal cavity size was normal in size. There is no left ventricular hypertrophy. Abnormal (paradoxical) septal motion, consistent with left bundle branch block.  Left ventricular diastolic function could not be evaluated due to atrial fibrillation. Left ventricular diastolic function could not  be evaluated. Right Ventricle: The right ventricular size is normal. No increase in right ventricular wall thickness. Right ventricular systolic function is mildly reduced. There is normal pulmonary artery systolic pressure. The tricuspid regurgitant velocity is 2.28 m/s, and with an assumed right atrial pressure of 8 mmHg, the estimated right ventricular systolic pressure is 33.8 mmHg. Left Atrium: Left atrial size was mildly dilated. Right Atrium: Right atrial size was normal in size. Pericardium: There is no evidence of pericardial effusion. Mitral Valve: The mitral valve is grossly normal. Mild mitral valve regurgitation. No evidence of mitral valve stenosis. Tricuspid Valve: The tricuspid valve is grossly normal. Tricuspid valve regurgitation is trivial. No evidence of tricuspid stenosis. Aortic Valve: The aortic valve is tricuspid. There is mild calcification of the aortic valve. There is mild thickening of the aortic valve. Aortic valve regurgitation is not visualized. Aortic valve sclerosis is present, with no evidence of aortic valve stenosis. Pulmonic Valve: The pulmonic valve was grossly normal. Pulmonic valve regurgitation is mild. No evidence of pulmonic stenosis. Aorta: The aortic root and ascending aorta are structurally normal, with no evidence of dilitation. Venous:  The inferior vena cava is normal in size with less than 50% respiratory variability, suggesting right atrial pressure of 8 mmHg. IAS/Shunts: The atrial septum is grossly normal.  LEFT VENTRICLE PLAX 2D LVIDd:         4.40 cm LVIDs:         3.60 cm LV PW:         1.00 cm LV IVS:        0.90 cm LVOT diam:     2.00 cm LV SV:         35 LV SV Index:   24 LVOT Area:     3.14 cm  LV Volumes (MOD) LV vol d, MOD A2C: 64.5 ml LV vol d, MOD A4C: 82.5 ml LV vol s, MOD A2C: 44.5 ml LV vol s, MOD A4C: 46.8 ml LV SV  MOD A2C:     20.0 ml LV SV MOD A4C:     82.5 ml LV SV MOD BP:      27.8 ml RIGHT VENTRICLE TAPSE (M-mode): 1.5 cm LEFT ATRIUM             Index        RIGHT ATRIUM           Index LA diam:        4.00 cm 2.72 cm/m   RA Area:     18.00 cm LA Vol (A2C):   62.6 ml 42.58 ml/m  RA Volume:   44.20 ml  30.07 ml/m LA Vol (A4C):   57.8 ml 39.32 ml/m LA Biplane Vol: 61.1 ml 41.56 ml/m  AORTIC VALVE LVOT Vmax:   80.97 cm/s LVOT Vmean:  48.967 cm/s LVOT VTI:    0.111 m  AORTA Ao Root diam: 3.20 cm Ao Asc diam:  3.60 cm MR Peak grad:    77.4 mmHg    TRICUSPID VALVE MR Mean grad:    49.0 mmHg    TR Peak grad:   20.8 mmHg MR Vmax:         440.00 cm/s  TR Vmax:        228.00 cm/s MR Vmean:        333.0 cm/s MR PISA:         0.57 cm     SHUNTS MR PISA Eff ROA: 5 mm        Systemic VTI:  0.11 m MR PISA Radius:  0.30 cm      Systemic Diam: 2.00 cm Eleonore Chiquito MD Electronically signed by Eleonore Chiquito MD Signature Date/Time: 01/19/2022/12:57:27 PM    Final    DG Chest 1 View  Result Date: 01/18/2022 CLINICAL DATA:  Fall, possible preop EXAM: CHEST  1 VIEW COMPARISON:  Radiograph 07/26/2014 FINDINGS: Mildly enlarged cardiac silhouette. No focal airspace disease. No pleural effusion. No pneumothorax. No acute osseous abnormality. IMPRESSION: Cardiomegaly.  No focal airspace consolidation. No acute osseous abnormality on single frontal view of the chest. Electronically Signed   By: Maurine Simmering M.D.   On: 01/18/2022 14:28   DG Femur Min 2 Views Right  Result Date: 01/18/2022 CLINICAL DATA:  Recent fall. EXAM: RIGHT FEMUR 2 VIEWS COMPARISON:  None Available. FINDINGS: Acute impacted subcapital right femoral neck fracture. No dislocation. Degenerative changes of the right knee. The right hip joint space is relatively preserved. Soft tissues are unremarkable. IMPRESSION: 1. Acute impacted subcapital right femoral neck fracture. Electronically Signed   By: Titus Dubin M.D.   On: 01/18/2022 14:26   DG Lumbar Spine  Complete  Result Date: 01/18/2022 CLINICAL DATA:  Recent fall. EXAM: LUMBAR SPINE - COMPLETE 4+ VIEW COMPARISON:  Lumbar spine x-rays dated March 31, 2009. FINDINGS: There is no evidence of lumbar spine fracture. Severe asymmetric disc height loss at L1-L2 and L5-S1. Moderate disc height loss at L4-L5. Severe left facet arthropathy at L5-S1. Findings have progressed since 2010. Incompletely visualized right femoral neck fracture. IMPRESSION: 1. No acute osseous abnormality of the lumbar spine. 2. Incompletely visualized right femoral neck fracture. Electronically Signed   By: Titus Dubin M.D.   On: 01/18/2022 14:25   DG Knee Complete 4 Views Right  Result Date: 01/18/2022 CLINICAL DATA:  Recent fall. EXAM: RIGHT KNEE - COMPLETE 4+ VIEW COMPARISON:  None Available. FINDINGS: No acute fracture or dislocation. No significant joint effusion. Tricompartmental degenerative changes with severe lateral compartment joint space narrowing. Osteopenia. Soft tissues are unremarkable. IMPRESSION: 1. No acute osseous abnormality. 2. Tricompartmental degenerative changes, severe in the lateral compartment. Electronically Signed   By: Titus Dubin M.D.   On: 01/18/2022 14:22   DG Hip Unilat With Pelvis 2-3 Views Right  Result Date: 01/18/2022 CLINICAL DATA:  Pain.  Status post fall. EXAM: DG HIP (WITH OR WITHOUT PELVIS) 2-3V RIGHT COMPARISON:  None Available. FINDINGS: There is an acute, impacted, subcapital right femoral neck fracture. No dislocation. No additional fractures identified. Lumbar scoliosis and degenerative disc disease noted. IMPRESSION: Acute, impacted, subcapital right femoral neck fracture. Electronically Signed   By: Kerby Moors M.D.   On: 01/18/2022 14:16     Labs:   Basic Metabolic Panel: Recent Labs  Lab 01/21/22 0140 01/22/22 0222 01/23/22 0313 01/24/22 0247 01/25/22 0521 01/26/22 0419 01/27/22 0424  NA 131* 138 137 138 137  --   --   K 4.3 3.6 3.6 3.1* 3.5  --   --   CL  100 104 103 106 105  --   --   CO2 21* _0 --   --   GLUCOSE 120* 103* 105* 97 90  --   --   BUN _1 --   --   CREATININE 1.05* 0.93 0.82 1.00 0.91  --   --   CALCIUM 9.3 9.4 8.7* 8.9 9.1  --   --   MG 1.9 2.1 2.0 2.1 2.0 2.2 2.1  PHOS 3.3 2.5 3.3 3.6 3.5 4.4 4.5   GFR Estimated Creatinine Clearance: 39.3 mL/min (by C-G formula based on SCr of 0.91 mg/dL). Liver Function Tests: Recent Labs  Lab 01/21/22 0140 01/22/22 0222 01/23/22 0313 01/24/22 0247 01/25/22 0521  AST _2 ALT _3 ALKPHOS 94 101 87 86 85  BILITOT 0.5 0.7 0.3 0.5 0.5  PROT 6.6 6.7 5.9* 5.9* 6.3*  ALBUMIN 3.1* 3.2* 2.8* 2.8* 2.9*   No results for input(s): "LIPASE", "AMYLASE" in the last 168 hours. No results for input(s): "AMMONIA" in the last 168 hours. Coagulation profile No results for input(s): "INR", "PROTIME" in the last 168 hours.  CBC: Recent Labs  Lab 01/23/22 0313 01/24/22 0247 01/25/22 0521 01/26/22 0419 01/27/22 0424  WBC 9.1 8.1 7.2 7.8 7.3  NEUTROABS 6.6 5.2 4.2 4.7 4.3  HGB 11.4* 11.6* 11.9* 12.6 12.8  HCT 34.1* 35.0* 36.1 38.6 39.6  MCV 95.3 96.2 97.0 96.3 98.3  PLT 322 400 454* 506* 497*   Cardiac Enzymes: No results for input(s): "CKTOTAL", "CKMB", "CKMBINDEX", "TROPONINI" in the last 168 hours. BNP:  Invalid input(s): "POCBNP" CBG: No results for input(s): "GLUCAP" in the last 168 hours. D-Dimer No results for input(s): "DDIMER" in the last 72 hours. Hgb A1c No results for input(s): "HGBA1C" in the last 72 hours. Lipid Profile No results for input(s): "CHOL", "HDL", "LDLCALC", "TRIG", "CHOLHDL", "LDLDIRECT" in the last 72 hours. Thyroid function studies No results for input(s): "TSH", "T4TOTAL", "T3FREE", "THYROIDAB" in the last 72 hours.  Invalid input(s): "FREET3" Anemia work up No results for input(s): "VITAMINB12", "FOLATE", "FERRITIN", "TIBC", "IRON", "RETICCTPCT" in the last 72 hours. Microbiology Recent Results (from  the past 240 hour(s))  MRSA Next Gen by PCR, Nasal     Status: None   Collection Time: 01/18/22  9:20 PM   Specimen: Nasal Mucosa; Nasal Swab  Result Value Ref Range Status   MRSA by PCR Next Gen NOT DETECTED NOT DETECTED Final    Comment: (NOTE) The GeneXpert MRSA Assay (FDA approved for NASAL specimens only), is one component of a comprehensive MRSA colonization surveillance program. It is not intended to diagnose MRSA infection nor to guide or monitor treatment for MRSA infections. Test performance is not FDA approved in patients less than 42 years old. Performed at [facility], [place] [address], [place], [place] [address]   Resp Panel by RT-PCR (Flu A&B, Covid) Anterior Nasal Swab     Status: None   Collection Time: 01/19/22  9:35 AM   Specimen: Anterior Nasal Swab  Result Value Ref Range Status   SARS Coronavirus 2 by RT PCR NEGATIVE NEGATIVE Final    Comment: (NOTE) SARS-CoV-2 target nucleic acids are NOT DETECTED.  The SARS-CoV-2 RNA is generally detectable in upper respiratory specimens during the acute phase of infection. The lowest concentration of SARS-CoV-2 viral copies this assay can detect is 138 copies/mL. A negative result does not preclude SARS-Cov-2 infection and should not be used as the sole basis for treatment or other patient management decisions. A negative result may occur with  improper specimen collection/handling, submission of specimen other than nasopharyngeal swab, presence of viral mutation(s) within the areas targeted by this assay, and inadequate number of viral copies(<138 copies/mL). A negative result must be combined with clinical observations, patient history, and epidemiological information. The expected result is Negative.  Fact Sheet for Patients:  EntrepreneurPulse.com.au  Fact Sheet for Healthcare Providers:  IncredibleEmployment.be  This test is no t yet approved or cleared by the  [place] FDA and  has been authorized for detection and/or diagnosis of SARS-CoV-2 by FDA under an Emergency Use Authorization (EUA). This EUA will remain  in effect (meaning this test can be used) for the duration of the COVID-19 declaration under Section 564(b)(1) of the Act, 21 U.S.C.section 360bbb-3(b)(1), unless the authorization is terminated  or revoked sooner.       Influenza A by PCR NEGATIVE NEGATIVE Final   Influenza B by PCR NEGATIVE NEGATIVE Final    Comment: (NOTE) The Xpert Xpress SARS-CoV-2/FLU/RSV plus assay is intended as an aid in the diagnosis of influenza from Nasopharyngeal swab specimens and should not be used as a sole basis for treatment. Nasal washings and aspirates are unacceptable for Xpert Xpress SARS-CoV-2/FLU/RSV testing.  Fact Sheet for Patients: EntrepreneurPulse.com.au  Fact Sheet for Healthcare Providers: IncredibleEmployment.be  This test is not yet approved or cleared by the [place] FDA and has been authorized for detection and/or diagnosis of SARS-CoV-2 by FDA under an Emergency Use Authorization (EUA). This EUA will remain in effect (meaning this test can be used) for  the duration of the COVID-19 declaration under Section 564(b)(1) of the Act, 21 U.S.C. section 360bbb-3(b)(1), unless the authorization is terminated or revoked.  Performed at [facility], [place] [address], [place], [place] [address]     Time coordinating discharge: 35 minutes  Signed: Marlowe Aschoff Shanah Guimaraes  [facility] 01/27/2022, 11:41 AM

## 2022-01-28 ENCOUNTER — Telehealth: Payer: Self-pay | Admitting: Cardiology

## 2022-01-28 NOTE — Telephone Encounter (Signed)
Attempt to call patient, no answer and unable to leave VM (VM not set up.  Upcoming appt 7/21

## 2022-01-28 NOTE — Telephone Encounter (Signed)
Calling in to see if dr hochrein is away of patients medication change that was mad a [place] while she was there. Wanted to make sure the dr is okay with it. Please advise

## 2022-01-30 ENCOUNTER — Other Ambulatory Visit: Payer: Self-pay

## 2022-01-30 ENCOUNTER — Telehealth: Payer: Self-pay | Admitting: Cardiology

## 2022-01-30 MED ORDER — SACUBITRIL-VALSARTAN 24-26 MG PO TABS: 1.0000 | ORAL_TABLET | Freq: Two times a day (BID) | ORAL | Status: DC

## 2022-01-30 NOTE — Telephone Encounter (Addendum)
Spoke with daughter-in-law who stated that patient has one entresto 24-26 left from hospital d/c orders. Patient needs a refill. She has an appointment with Arnold Long on 7/21. Shall I refill or wait for patient to see Curt Bears?

## 2022-01-30 NOTE — Telephone Encounter (Signed)
Daughter-in-law informed of refill for entresto. She was appreciative.

## 2022-01-30 NOTE — Telephone Encounter (Signed)
Pt c/o medication issue:  1. Name of Medication: sacubitril-valsartan (ENTRESTO) 24-26 MG  2. How are you currently taking this medication (dosage and times per day)? Twice daily as directed   3. Are you having a reaction (difficulty breathing--STAT)? no  4. What is your medication issue? Caregiver said that the patient only has one pill left to take tonight. Her  appointment tomorrow is not until the afternoon.  Caregiver wanted to know if the medication would need to be refilled  now or wait until after the appointment tomiorrow

## 2022-01-30 NOTE — Progress Notes (Signed)
I called [facility] of the [facility] set up for tomorrow (with I have bit and this is always happens asked that some did not like I will go around 3:00 if this okay and a he will be at work and have somebody come by around 3 Okay then you having we will call her into fine movements him I need to get him a list together I know what I need to get Cardiology Clinic Note   Patient Name: Rhonda Marshall Date of Encounter: 01/31/2022  Primary Care Provider:  Timoteo Gaul, FNP Primary Cardiologist:  Minus Breeding, MD  Patient Profile    71 y.o. female with PMH persistent afib, LBBB, HTN, systolic CHF (HFrEF), RA,  who presented  to hospital after falling in her garden and sustaining a right hip fracture. Cardiology saw on consultation for preoperative evaluation, echo showed further reduction in LVEF from 45-50% to 30-35%,, being seen status post discharge after right hip hemiarthroplasty 01/19/2022 in the setting of right femoral neck fracture.  She underwent successful DC cardioversion for atrial fibrillation.  She was placed on Entresto 24 /26 mg twice daily and Eliquis 5 mg BID.   Past Medical History    Past Medical History:  Diagnosis Date   Anxiety    Atrial fibrillation ([place])    newly diagnosed 10/2010   Family history of adverse reaction to anesthesia    Sister's BP drops    Hypertension    Rheumatoid arthritis(714.0)    Subclinical hyperthyroidism    Past Surgical History:  Procedure Laterality Date   Arm surgery     fracture, right arm   CARDIOVERSION N/A 08/10/2014   Procedure: CARDIOVERSION;  Surgeon: Dorothy Spark, MD;  Location: [place];  Service: Cardiovascular;  Laterality: N/A;   CARDIOVERSION N/A 01/22/2022   Procedure: CARDIOVERSION;  Surgeon: Freada Bergeron, MD;  Location: [facility] ENDOSCOPY;  Service: Cardiovascular;  Laterality: N/A;   CATARACT EXTRACTION     DIRECT LARYNGOSCOPY N/A 09/02/2019   Procedure: DIRECT LARYNGOSCOPY WITH BIOPSY;  Surgeon: Rozetta Nunnery, MD;  Location: [place];  Service: ENT;  Laterality: N/A;   HIP ARTHROPLASTY Right 01/19/2022   Procedure: ARTHROPLASTY BIPOLAR HIP (HEMIARTHROPLASTY);  Surgeon: Marchia Bond, MD;  Location: [facility];  Service: Orthopedics;  Laterality: Right;   IR GASTROSTOMY TUBE MOD SED  10/17/2019   IR GASTROSTOMY TUBE REMOVAL  01/03/2020   TEE WITHOUT CARDIOVERSION N/A 01/22/2022   Procedure: TRANSESOPHAGEAL ECHOCARDIOGRAM (TEE);  Surgeon: Freada Bergeron, MD;  Location: [facility] ENDOSCOPY;  Service: Cardiovascular;  Laterality: N/A;   TUBAL LIGATION      Allergies  Allergies  Allergen Reactions   Clindamycin/Lincomycin Rash   Latex Rash    Latex gloves    History of Present Illness    [name] comes to the office today posthospitalization for ongoing assessment and management of atrial fibrillation, status post DCCV during recent hospitalization for right hip fracture, echocardiogram revealing EF of 30 to 35% (reduced from 45 to 50% seen on echo in 2020);Marland Kitchen    She was started on metoprolol succinate 100 mg twice daily, Aldactone 25 mg, Jardiance 10 mg daily, with consideration to start Lasix with evidence of volume overload on follow-up.  She was transferred to skilled nursing facility, [facility], in [place], for physical therapy.  She is here with her daughter-in-law and there is some confusion about what medication she needs to be on.  They have brought both the medications she was taking prior to admission  along with the medication she was given at the skilled nursing facility.  They are all in bubble packs.  She denies chest pain shortness of breath or dizziness.  She has left the skilled nursing facility as she was not happy with her treatment and has not undergone any further physical therapy.  Primary care is trying to arrange physical therapy at home.  Home Medications    Current Outpatient Medications  Medication Sig Dispense Refill   acetaminophen (TYLENOL)  325 MG tablet Take 1-2 tablets (325-650 mg total) by mouth every 6 (six) hours as needed for mild pain (pain score 1-3 or temp > 100.5).     amiodarone (PACERONE) 200 MG tablet 200 mg twice a day for 7 days to complete on 7/19.  Start 200 mg once a day starting 7/20.     apixaban (ELIQUIS) 5 MG TABS tablet Take 1 tablet (5 mg total) by mouth 2 (two) times daily. NEEDS LABS, PLEASE COME TO OFFICE ASAP 30 tablet 0   docusate sodium (COLACE) 100 MG capsule Take 1 capsule (100 mg total) by mouth 2 (two) times daily. 10 capsule 0   empagliflozin (JARDIANCE) 10 MG TABS tablet Take 1 tablet (10 mg total) by mouth daily. 30 tablet    ferrous sulfate 325 (65 FE) MG tablet Take 1 tablet (325 mg total) by mouth 3 (three) times daily after meals.  3   metoprolol succinate (TOPROL-XL) 100 MG 24 hr tablet Take 1 tablet (100 mg total) by mouth 2 (two) times daily. Take with or immediately following a meal. 180 tablet 3   Multiple Vitamin (MULTIVITAMIN WITH MINERALS) TABS tablet Take 1 tablet by mouth daily.      oxyCODONE-acetaminophen (PERCOCET) 10-325 MG tablet Take 1 tablet by mouth every 4 (four) hours as needed for pain. 30 tablet 0   polyethylene glycol (MIRALAX / GLYCOLAX) 17 g packet Take 17 g by mouth daily as needed for mild constipation. 14 each 0   propylthiouracil (PTU) 50 MG tablet Take 1 tablet (50 mg total) by mouth 3 (three) times daily. 270 tablet 1   sacubitril-valsartan (ENTRESTO) 24-26 MG Take 1 tablet by mouth 2 (two) times daily. 28 tablet 0   spironolactone (ALDACTONE) 25 MG tablet Take 1 tablet (25 mg total) by mouth daily.     Vitamin D, Ergocalciferol, (DRISDOL) 1.25 MG (50000 UNIT) CAPS capsule Take 50,000 Units by mouth once a week.     sacubitril-valsartan (ENTRESTO) 24-26 MG Take 1 tablet by mouth 2 (two) times daily. 60 tablet 2   No current facility-administered medications for this visit.     Family History    Family History  Problem Relation Age of Onset   Heart attack  Mother 105       pacemaker age 42   Hypothyroidism Mother    Cancer Father        colon and prostate   Diabetes Brother    CAD Brother 31   Cancer Brother        Renal   Hypothyroidism Sister    She indicated that the status of her mother is unknown. She indicated that her father is deceased. She indicated that the status of her sister is unknown. She indicated that her maternal grandmother is deceased. She indicated that her maternal grandfather is deceased. She indicated that her paternal grandmother is deceased. She indicated that her paternal grandfather is deceased. She indicated that her other is deceased.  Social History    Social History  Socioeconomic History   Marital status: Widowed    Spouse name: Not on file   Number of children: 1   Years of education: Not on file   Highest education level: Not on file  Occupational History   Occupation: laborer    Comment: [employer]  Tobacco Use   Smoking status: Former    Packs/day: 0.25    Years: 54.00    Total pack years: 13.50    Types: Cigarettes    Start date: 64    Quit date: 10/2019    Years since quitting: 2.3   Smokeless tobacco: Never  Vaping Use   Vaping Use: Never used  Substance and Sexual Activity   Alcohol use: Not Currently    Alcohol/week: 0.0 standard drinks of alcohol   Drug use: No   Sexual activity: Not on file  Other Topics Concern   Not on file  Social History Narrative   Not on file   Social Determinants of Health   Financial Resource Strain: Not on file  Food Insecurity: Not on file  Transportation Needs: Not on file  Physical Activity: Not on file  Stress: Not on file  Social Connections: Not on file  Intimate Partner Violence: Not on file     Review of Systems    General:  No chills, fever, night sweats or weight changes.  Cardiovascular:  No chest pain, dyspnea on exertion, edema, orthopnea, palpitations, paroxysmal nocturnal dyspnea. Dermatological: No rash,  lesions/masses Respiratory: No cough, dyspnea Urologic: No hematuria, dysuria Abdominal:   No nausea, vomiting, diarrhea, bright red blood per rectum, melena, or hematemesis Neurologic:  No visual changes, wkns, changes in mental status. All other systems reviewed and are otherwise negative except as noted above.     Physical Exam    VS:  BP (!) 83/54   Pulse 96   Ht '5\' 2"'$  (1.575 m)   Wt 114 lb (51.7 kg)   SpO2 99%   BMI 20.85 kg/m  , BMI Body mass index is 20.85 kg/m.     GEN: Well nourished, well developed, in no acute distress.  Uses a walker for ambulation but became tired when arriving to the waiting area and now is in a wheelchair. HEENT: normal. Neck: Supple, no JVD, carotid bruits, or masses. Cardiac: RRR, no murmurs, rubs, or gallops. No clubbing, cyanosis, edema.  Radials/DP/PT 2+ and equal bilaterally.  Respiratory:  Respirations regular and unlabored, clear to auscultation bilaterally. GI: Soft, nontender, nondistended, BS + x 4. MS: no deformity or atrophy. Skin: warm and dry, no rash. Neuro:  Strength and sensation are intact. Psych: Normal affect.  Some confusion and memory deficits.   Accessory Clinical Findings    ECG personally reviewed by me today-normal sinus rhythm, artifact noted in the limb leads.  Heart rate of 80 bpm- No acute changes  Lab Results  Component Value Date   WBC 7.3 01/27/2022   HGB 12.8 01/27/2022   HCT 39.6 01/27/2022   MCV 98.3 01/27/2022   PLT 497 (H) 01/27/2022   Lab Results  Component Value Date   CREATININE 0.91 01/25/2022   BUN 16 01/25/2022   NA 137 01/25/2022   K 3.5 01/25/2022   CL 105 01/25/2022   CO2 22 01/25/2022   Lab Results  Component Value Date   ALT 13 01/25/2022   AST 16 01/25/2022   ALKPHOS 85 01/25/2022   BILITOT 0.5 01/25/2022   Lab Results  Component Value Date   LDLDIRECT 117 (H) 09/20/2008  Lab Results  Component Value Date   HGBA1C  11/05/2010    5.5 (NOTE)                                                                        According to the ADA Clinical Practice Recommendations for 2011, when HbA1c is used as a screening test:   >=6.5%   Diagnostic of Diabetes Mellitus           (if abnormal result  is confirmed)  5.7-6.4%   Increased risk of developing Diabetes Mellitus  References:Diagnosis and Classification of Diabetes Mellitus,Diabetes FTDD,2202,54(YHCWC 1):S62-S69 and Standards of Medical Care in         Diabetes - 2011,Diabetes Care,2011,34  (Suppl 1):S11-S61.    Review of Prior Studies:  Echo 01/19/22 1. Left ventricular ejection fraction, by estimation, is 30 to 35%. The  left ventricle has moderately decreased function. The left ventricle  demonstrates global hypokinesis. Left ventricular diastolic function could  not be evaluated.   2. Right ventricular systolic function is mildly reduced. The right  ventricular size is normal. There is normal pulmonary artery systolic  pressure. The estimated right ventricular systolic pressure is 37.6 mmHg.   3. Left atrial size was mildly dilated.   4. The mitral valve is grossly normal. Mild mitral valve regurgitation.  No evidence of mitral stenosis.   5. The aortic valve is tricuspid. There is mild calcification of the  aortic valve. There is mild thickening of the aortic valve. Aortic valve  regurgitation is not visualized. Aortic valve sclerosis is present, with  no evidence of aortic valve stenosis.   6. The inferior vena cava is normal in size with <50% respiratory  variability, suggesting right atrial pressure of 8 mmHg.   Comparison(s): Changes from prior study are noted. The left ventricular  function is worsened.   Assessment & Plan   1.  Chronic HFrEF: EF 30 to 35%.  Medications have been changed since discharge.  She may have been taking diltiazem at home, but her daughter-in-law has decided to separate that medication from her box.  She now has bubble packs that she is using from the skilled nursing  facility.  Continue Enteresto 24/26 mg (sample given) as she has run out and her pharmacy did not stock it.  They are going to a different pharmacy to have it filled.  She is not on any diuretics at this time.  Blood pressure is soft.  Remains on GDMT.  Follow-up echo in 3 months.  Unable to titrate Entresto at this time due to hypotension.  2.  Paroxysmal atrial fibrillation: Remains on apixaban 5 mg twice daily and amiodarone, 01/31/2019 decreased to 100 mg daily.  Continue metoprolol 100 mg twice daily.   3.  Status post right femoral head fracture: Is to have follow-up physical therapy at home.   4.  Chronic pain: On oxycodone along with Xanax.  Followed by pain management.     Current medicines are reviewed at length with the patient today.  I have spent  45 min's  dedicated to the care of this patient on the date of this encounter to include pre-visit review of records, assessment, management and diagnostic testing,with shared decision making. Signed, Phill Myron.  West Pugh, ANP, AACC   01/31/2022 4:26 PM    [facility] [place] [address] Office [phone] Fax [phone]  Notice: This dictation was prepared with Dragon dictation along with smaller phrase technology. Any transcriptional errors that result from this process are unintentional and may not be corrected upon review.

## 2022-01-31 ENCOUNTER — Telehealth: Payer: Self-pay | Admitting: Cardiology

## 2022-01-31 ENCOUNTER — Encounter: Payer: Self-pay | Admitting: Adult Health

## 2022-01-31 ENCOUNTER — Ambulatory Visit (INDEPENDENT_AMBULATORY_CARE_PROVIDER_SITE_OTHER): Payer: 59 | Admitting: Adult Health

## 2022-01-31 VITALS — BP 83/54 | HR 96 | Ht 62.0 in | Wt 114.0 lb

## 2022-01-31 DIAGNOSIS — I10 Essential (primary) hypertension: Secondary | ICD-10-CM | POA: Diagnosis not present

## 2022-01-31 DIAGNOSIS — I5022 Chronic systolic (congestive) heart failure: Secondary | ICD-10-CM

## 2022-01-31 DIAGNOSIS — I480 Paroxysmal atrial fibrillation: Secondary | ICD-10-CM

## 2022-01-31 DIAGNOSIS — S72001S Fracture of unspecified part of neck of right femur, sequela: Secondary | ICD-10-CM | POA: Diagnosis not present

## 2022-01-31 MED ORDER — SACUBITRIL-VALSARTAN 24-26 MG PO TABS: 1.0000 | ORAL_TABLET | Freq: Two times a day (BID) | ORAL | 2 refills | Status: DC

## 2022-01-31 MED ORDER — METOPROLOL SUCCINATE ER 100 MG PO TB24: 100.0000 mg | ORAL_TABLET | Freq: Two times a day (BID) | ORAL | 3 refills | Status: DC

## 2022-01-31 MED ORDER — ENTRESTO 24-26 MG PO TABS: 1.0000 | ORAL_TABLET | Freq: Two times a day (BID) | ORAL | 0 refills | Status: DC

## 2022-01-31 NOTE — Patient Instructions (Signed)
Medication Instructions:  Stop Cardizem. *If you need a refill on your cardiac medications before your next appointment, please call your pharmacy*   Lab Work: No Labs If you have labs (blood work) drawn today and your tests are completely normal, you will receive your results only by: [place] (if you have MyChart) OR A paper copy in the mail If you have any lab test that is abnormal or we need to change your treatment, we will call you to review the results.   Testing/Procedures: No Testing   Follow-Up: At [facility], you and your health needs are our priority.  As part of our continuing mission to provide you with exceptional heart care, we have created designated Provider Care Teams.  These Care Teams include your primary Cardiologist (physician) and Advanced Practice Providers (APPs -  Physician Assistants and Nurse Practitioners) who all work together to provide you with the care you need, when you need it.  We recommend signing up for the patient portal called "MyChart".  Sign up information is provided on this After Visit Summary.  MyChart is used to connect with patients for Virtual Visits (Telemedicine).  Patients are able to view lab/test results, encounter notes, upcoming appointments, etc.  Non-urgent messages can be sent to your provider as well.   To learn more about what you can do with MyChart, go to [email or web address].    Your next appointment:   1 month(s)  The format for your next appointment:   In Person  Provider:   Minus Breeding, MD       Important Information About Sugar

## 2022-01-31 NOTE — Telephone Encounter (Signed)
Refills has been sent to the pharmacy. 

## 2022-01-31 NOTE — Telephone Encounter (Signed)
*  STAT* If patient is at the pharmacy, call can be transferred to refill team.   1. Which medications need to be refilled? (please list name of each medication and dose if known) metoprolol succinate (TOPROL-XL) 100 MG 24 hr tablet  2. Which pharmacy/location (including street and city if local pharmacy) is medication to be sent to? [place], [place]  3. Do they need a 30 day or 90 day supply? 90  Patient is out of medication. Pharmacy stated they didn't have prescription

## 2022-02-03 NOTE — Telephone Encounter (Signed)
Patient was seen on 07/21- will remove from triage call back.

## 2022-02-07 ENCOUNTER — Telehealth: Payer: Self-pay | Admitting: Cardiology

## 2022-02-07 MED ORDER — SPIRONOLACTONE 25 MG PO TABS: 25.0000 mg | ORAL_TABLET | Freq: Every day | ORAL | 3 refills | Status: DC

## 2022-02-07 MED ORDER — EMPAGLIFLOZIN 10 MG PO TABS: 10.0000 mg | ORAL_TABLET | Freq: Every day | ORAL | 3 refills | Status: DC

## 2022-02-07 MED ORDER — AMIODARONE HCL 200 MG PO TABS: 200.0000 mg | ORAL_TABLET | Freq: Every day | ORAL | 3 refills | Status: DC

## 2022-02-07 NOTE — Telephone Encounter (Signed)
*  STAT* If patient is at the pharmacy, call can be transferred to refill team.   1. Which medications need to be refilled? (please list name of each medication and dose if known) spironolactone (ALDACTONE) 25 MG tablet empagliflozin (JARDIANCE) 10 MG TABS tablet amiodarone (PACERONE) 200 MG tablet  2. Which pharmacy/location (including street and city if local pharmacy) is medication to be sent to? [place], [place]  3. Do they need a 30 day or 90 day supply? 90 day  Patient is out of medication

## 2022-02-07 NOTE — Telephone Encounter (Signed)
Refill sent to the pharmacy electronically.  

## 2022-03-06 NOTE — Progress Notes (Signed)
Cardiology Office Note   Date:  03/07/2022   ID:  Rhonda Marshall, Rhonda Marshall 05/12/1951, MRN [id number]  PCP:  Timoteo Gaul, FNP  Cardiologist:   Minus Breeding, MD   Chief Complaint  Patient presents with   Atrial Fibrillation     History of Present Illness: Rhonda Marshall is a 71 y.o. female who presents for evaluation of atrial fibrillation. She had DCCV in the past but she went back into atrial fib.   Since I last saw her she was in the hospital with a broken hip.  I reviewed these records.  She had atrial fibrillation with rapid rate and had cardioversion.  She was treated with amiodarone.  She also had some heart failure and required some diuresis.  At home she has been doing okay except she is clearly confusing some of her medicines.  Sounds like she has a lot of bottles of medicines at home that are old.  She took double the dose of her medications this morning.  Systolic blood pressure is actually 60 although she is not particularly symptomatic.  I repeated to verify this.  She is back in atrial fibrillation.  She does not feel it.  She is not having presyncope or syncope.  She is not having any chest pressure, neck or arm discomfort.  She has had no weight gain or edema.   Past Medical History:  Diagnosis Date   Anxiety    Atrial fibrillation ([place])    newly diagnosed 10/2010   Family history of adverse reaction to anesthesia    Sister's BP drops    Hypertension    Rheumatoid arthritis(714.0)    Subclinical hyperthyroidism     Past Surgical History:  Procedure Laterality Date   Arm surgery     fracture, right arm   CARDIOVERSION N/A 08/10/2014   Procedure: CARDIOVERSION;  Surgeon: Dorothy Spark, MD;  Location: [place];  Service: Cardiovascular;  Laterality: N/A;   CARDIOVERSION N/A 01/22/2022   Procedure: CARDIOVERSION;  Surgeon: Freada Bergeron, MD;  Location: [facility] ENDOSCOPY;  Service: Cardiovascular;  Laterality: N/A;   CATARACT EXTRACTION      DIRECT LARYNGOSCOPY N/A 09/02/2019   Procedure: DIRECT LARYNGOSCOPY WITH BIOPSY;  Surgeon: Rozetta Nunnery, MD;  Location: [place];  Service: ENT;  Laterality: N/A;   HIP ARTHROPLASTY Right 01/19/2022   Procedure: ARTHROPLASTY BIPOLAR HIP (HEMIARTHROPLASTY);  Surgeon: Marchia Bond, MD;  Location: [facility];  Service: Orthopedics;  Laterality: Right;   IR GASTROSTOMY TUBE MOD SED  10/17/2019   IR GASTROSTOMY TUBE REMOVAL  01/03/2020   TEE WITHOUT CARDIOVERSION N/A 01/22/2022   Procedure: TRANSESOPHAGEAL ECHOCARDIOGRAM (TEE);  Surgeon: Freada Bergeron, MD;  Location: [facility] ENDOSCOPY;  Service: Cardiovascular;  Laterality: N/A;   TUBAL LIGATION       Current Outpatient Medications  Medication Sig Dispense Refill   acetaminophen (TYLENOL) 325 MG tablet Take 1-2 tablets (325-650 mg total) by mouth every 6 (six) hours as needed for mild pain (pain score 1-3 or temp > 100.5).     ALPRAZolam (XANAX) 1 MG tablet Take 1 mg by mouth 3 (three) times daily as needed.     apixaban (ELIQUIS) 5 MG TABS tablet Take 1 tablet (5 mg total) by mouth 2 (two) times daily. NEEDS LABS, PLEASE COME TO OFFICE ASAP 30 tablet 0   docusate sodium (COLACE) 100 MG capsule Take 1 capsule (100 mg total) by mouth 2 (two) times daily. 10 capsule 0   empagliflozin (JARDIANCE) 10  MG TABS tablet Take 1 tablet (10 mg total) by mouth daily. 90 tablet 3   ferrous sulfate 325 (65 FE) MG tablet Take 1 tablet (325 mg total) by mouth 3 (three) times daily after meals.  3   metoprolol succinate (TOPROL-XL) 100 MG 24 hr tablet Take 1 tablet (100 mg total) by mouth 2 (two) times daily. Take with or immediately following a meal. 180 tablet 3   Multiple Vitamin (MULTIVITAMIN WITH MINERALS) TABS tablet Take 1 tablet by mouth daily.      oxyCODONE-acetaminophen (PERCOCET) 10-325 MG tablet Take 1 tablet by mouth every 4 (four) hours as needed for pain. 30 tablet 0   polyethylene glycol (MIRALAX / GLYCOLAX) 17 g packet Take 17 g  by mouth daily as needed for mild constipation. 14 each 0   propylthiouracil (PTU) 50 MG tablet Take 1 tablet (50 mg total) by mouth 3 (three) times daily. 270 tablet 1   sacubitril-valsartan (ENTRESTO) 24-26 MG Take 1 tablet by mouth 2 (two) times daily. 60 tablet 2   spironolactone (ALDACTONE) 25 MG tablet Take 1 tablet (25 mg total) by mouth daily. 90 tablet 3   Vitamin D, Ergocalciferol, (DRISDOL) 1.25 MG (50000 UNIT) CAPS capsule Take 50,000 Units by mouth once a week.     No current facility-administered medications for this visit.    Allergies:   Clindamycin/lincomycin and Latex    ROS:  Please see the history of present illness.   Otherwise, review of systems are positive for none.   All other systems are reviewed and negative.    PHYSICAL EXAM: VS:  BP (!) 76/50 (BP Location: Right Arm, Patient Position: Supine, Cuff Size: Normal)   Pulse 93   Ht '5\' 2"'$  (1.575 m)   Wt 113 lb 12.8 oz (51.6 kg)   SpO2 98%   BMI 20.81 kg/m  , BMI Body mass index is 20.81 kg/m. GENERAL:  Well appearing NECK:  No jugular venous distention, waveform within normal limits, carotid upstroke brisk and symmetric, no bruits, no thyromegaly LUNGS:  Clear to auscultation bilaterally CHEST:  Unremarkable HEART:  PMI not displaced or sustained,S1 and S2 within normal limits, no S3,  no clicks, no rubs, no murmurs, irregular  ABD:  Flat, positive bowel sounds normal in frequency in pitch, no bruits, no rebound, no guarding, no midline pulsatile mass, no hepatomegaly, no splenomegaly EXT:  2 plus pulses throughout, no edema, no cyanosis no clubbing   EKG:  EKG is  ordered today. The ekg ordered today demonstrates atrial fibrillation, rate 93, interventricular conduction delay, left axis deviation   Recent Labs: 01/18/2022: TSH 1.746 01/22/2022: B Natriuretic Peptide 1,960.2 01/25/2022: ALT 13; BUN 16; Creatinine, Ser 0.91; Potassium 3.5; Sodium 137 01/27/2022: Hemoglobin 12.8; Magnesium 2.1; Platelets 497     Lipid Panel    Component Value Date/Time   LDLDIRECT 117 (H) 09/20/2008 2239      Wt Readings from Last 3 Encounters:  03/07/22 113 lb 12.8 oz (51.6 kg)  01/31/22 114 lb (51.7 kg)  01/27/22 98 lb 1.7 oz (44.5 kg)      Other studies Reviewed: Additional studies/ records that were reviewed today include: Hospital records. Review of the above records demonstrates:  Please see elsewhere in the note.     ASSESSMENT AND PLAN:  ATRIAL FIB:     Rhonda Marshall has a CHA2DS2 - VASc score of 2.   She is not going to maintain sinus rhythm.  I am going to discontinue her amiodarone.  HYPOTENSION: We kept her in the office today but overly hydrated.  We watched her for quite a while repeating blood pressures.  She is to skip her p.m. meds tonight.  I am also going to arrange for our pharmacy team to have a video visit with her and her caregivers to go through all the medicines and home and get rid of the old medications and not relevant medications.  TOBACCO ABUSE:    She is not able to stop smoking.  HYPERTHYROID:     This is followed by Dr. Dwyane Dee.    CARDIOMYOPATHY:    She has had a mildly reduced ejection fraction.  She would continue the meds as listed with the clarifications as above.   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    current medicines are reviewed at length with the patient today.  The patient does not have concerns regarding medicines.  The following changes have been made: None  Labs/ tests ordered today include: None  Orders Placed This Encounter  Procedures   AMB Referral to [facility]   EKG 12-Lead    Disposition:   FU with APP in 2 months.     Signed, Minus Breeding, MD  03/07/2022 1:49 PM    [facility]

## 2022-03-07 ENCOUNTER — Ambulatory Visit (INDEPENDENT_AMBULATORY_CARE_PROVIDER_SITE_OTHER): Payer: 59 | Admitting: Cardiology

## 2022-03-07 ENCOUNTER — Encounter: Payer: Self-pay | Admitting: Cardiology

## 2022-03-07 VITALS — BP 76/50 | HR 93 | Ht 62.0 in | Wt 113.8 lb

## 2022-03-07 DIAGNOSIS — I10 Essential (primary) hypertension: Secondary | ICD-10-CM | POA: Diagnosis not present

## 2022-03-07 DIAGNOSIS — I480 Paroxysmal atrial fibrillation: Secondary | ICD-10-CM | POA: Diagnosis not present

## 2022-03-07 DIAGNOSIS — I5022 Chronic systolic (congestive) heart failure: Secondary | ICD-10-CM | POA: Diagnosis not present

## 2022-03-07 NOTE — Patient Instructions (Addendum)
Medication Instructions:   -Stop taking amiodarone (pacerone).  *If you need a refill on your cardiac medications before your next appointment, please call your pharmacy*   Follow-Up: At [facility], you and your health needs are our priority.  As part of our continuing mission to provide you with exceptional heart care, we have created designated Provider Care Teams.  These Care Teams include your primary Cardiologist (physician) and Advanced Practice Providers (APPs -  Physician Assistants and Nurse Practitioners) who all work together to provide you with the care you need, when you need it.  We recommend signing up for the patient portal called "MyChart".  Sign up information is provided on this After Visit Summary.  MyChart is used to connect with patients for Virtual Visits (Telemedicine).  Patients are able to view lab/test results, encounter notes, upcoming appointments, etc.  Non-urgent messages can be sent to your provider as well.   To learn more about what you can do with MyChart, go to [email or web address].    Your next appointment:   2-3 month(s)  The format for your next appointment:   In Person  Provider:   Fabian Sharp, PA-C, Sande Rives, PA-C, Caron Presume, PA-C, Jory Sims, DNP, ANP, Almyra Deforest, PA-C, or Diona Browner, NP       Other Instructions We will schedule you a video visit with PharmD to review your medications. Daughter, [name] should be available for this visit.

## 2022-03-27 ENCOUNTER — Ambulatory Visit: Payer: 59

## 2022-03-31 ENCOUNTER — Other Ambulatory Visit: Payer: Self-pay

## 2022-03-31 ENCOUNTER — Telehealth: Payer: Self-pay | Admitting: Cardiology

## 2022-03-31 ENCOUNTER — Other Ambulatory Visit: Payer: Self-pay | Admitting: Cardiology

## 2022-03-31 DIAGNOSIS — I4811 Longstanding persistent atrial fibrillation: Secondary | ICD-10-CM

## 2022-03-31 MED ORDER — APIXABAN 5 MG PO TABS: 5.0000 mg | ORAL_TABLET | Freq: Two times a day (BID) | ORAL | 1 refills | Status: DC

## 2022-03-31 NOTE — Telephone Encounter (Signed)
*  STAT* If patient is at the pharmacy, call can be transferred to refill team.   1. Which medications need to be refilled? (please list name of each medication and dose if known) apixaban (ELIQUIS) 5 MG TABS tablet  2. Which pharmacy/location (including street and city if local pharmacy) is medication to be sent to? [place], [place]  3. Do they need a 30 day or 90 day supply? 90 day   Patient is out of medication

## 2022-03-31 NOTE — Telephone Encounter (Signed)
Prescription refill request for Eliquis received. Indication:Afib Last office visit:8/23 Scr:0.9 Age: 71 Weight:51.6 kg  Prescription refilled

## 2022-03-31 NOTE — Telephone Encounter (Signed)
Prescription refill request for Eliquis received. Indication: Afib  Last office visit: 03/07/22 ([place]) Scr: 0.91 (01/25/22) Age: 71 Weight: 51.6kg  Appropriate dose and refill sent to requested pharmacy.

## 2022-04-23 ENCOUNTER — Other Ambulatory Visit: Payer: Self-pay | Admitting: Cardiology

## 2022-04-24 ENCOUNTER — Other Ambulatory Visit: Payer: Self-pay | Admitting: Cardiology

## 2022-04-26 ENCOUNTER — Other Ambulatory Visit: Payer: Self-pay | Admitting: Cardiology

## 2022-05-05 NOTE — Progress Notes (Unsigned)
Cardiology Office Note:    Date:  05/07/2022   ID:  Rhonda Marshall, DOB 05-01-1951, MRN [id number]  PCP:  Timoteo Gaul, [place] Prov[facility] Cardiologist:  Minus Breeding, MD     Referring MD: Timoteo Gaul, FNP   Chief Complaint  Patient presents with   Follow-up    Hypotension, CHF, med management    History of Present Illness:    Rhonda Marshall is a 71 y.o. female with a hx of persistent atrial fibrillation, chronic anticoagulation, LBBB, hypertension, chronic systolic heart failure, and RA.  She underwent cardioversion January 2016.  She was seen during hospitalization in July after a fall for preoperative risk evaluation prior to hip surgery.  Hospital course was complicated with A-fib RVR and she subsequently underwent DCCV on amiodarone but unfortunately returned to atrial fibrillation..  Echocardiogram 01/19/2022 with LVEF 30-35%, mildly reduced RV function, mild MR, and mild left atrial enlargement.  Rate control was titrated and she was discharged on 100 mg Toprol, and p.o. amiodarone taper.  GDMT titrated for acute reduction in her EF.  Plan for further medication titration and repeat echocardiogram in 3 months.  During follow-up clinic visits, there was some confusion regarding her home medications.  She was last seen by Dr. Percival Spanish 03/07/2022 and she was tolerating hypotension and her rate and rhythm fairly well.  Her amiodarone had been reduced to 100 mg.  Dr. Percival Spanish opted to discontinue this altogether as he had low suspicion that she would maintain sinus rhythm.  Hypotension was monitored extensively late in the office, there was suspicion of medication confusion again.  A virtual visit was planned with the Pharm.D. team to review her medications, not sure this was completed.  She presents today with [name], her DIL. Pt is having severe fatigue, dry mouth, and dizziness for about 3 hours after her morning medications.  It is unclear if this is  every day or only associated when she does not eat.  DIL reports that she has seen the symptoms and her even after she has eaten breakfast with her medications.  These symptoms are worse if she takes medications without eating soon after. I encouraged PO intake.    Past Medical History:  Diagnosis Date   Anxiety    Atrial fibrillation ([place])    newly diagnosed 10/2010   Family history of adverse reaction to anesthesia    Sister's BP drops    Hypertension    Rheumatoid arthritis(714.0)    Subclinical hyperthyroidism     Past Surgical History:  Procedure Laterality Date   Arm surgery     fracture, right arm   CARDIOVERSION N/A 08/10/2014   Procedure: CARDIOVERSION;  Surgeon: Dorothy Spark, MD;  Location: [place];  Service: Cardiovascular;  Laterality: N/A;   CARDIOVERSION N/A 01/22/2022   Procedure: CARDIOVERSION;  Surgeon: Freada Bergeron, MD;  Location: [facility] ENDOSCOPY;  Service: Cardiovascular;  Laterality: N/A;   CATARACT EXTRACTION     DIRECT LARYNGOSCOPY N/A 09/02/2019   Procedure: DIRECT LARYNGOSCOPY WITH BIOPSY;  Surgeon: Rozetta Nunnery, MD;  Location: [place];  Service: ENT;  Laterality: N/A;   HIP ARTHROPLASTY Right 01/19/2022   Procedure: ARTHROPLASTY BIPOLAR HIP (HEMIARTHROPLASTY);  Surgeon: Marchia Bond, MD;  Location: [facility] ORS;  Service: Orthopedics;  Laterality: Right;   IR GASTROSTOMY TUBE MOD SED  10/17/2019   IR GASTROSTOMY TUBE REMOVAL  01/03/2020   TEE WITHOUT CARDIOVERSION N/A 01/22/2022   Procedure: TRANSESOPHAGEAL ECHOCARDIOGRAM (TEE);  Surgeon: Johney Frame,  Greer Ee, MD;  Location: [facility] ENDOSCOPY;  Service: Cardiovascular;  Laterality: N/A;   TUBAL LIGATION      Current Medications: Current Meds  Medication Sig   acetaminophen (TYLENOL) 325 MG tablet Take 1-2 tablets (325-650 mg total) by mouth every 6 (six) hours as needed for mild pain (pain score 1-3 or temp > 100.5).   ALPRAZolam (XANAX) 1 MG tablet Take 1 mg by mouth 3 (three)  times daily as needed.   apixaban (ELIQUIS) 5 MG TABS tablet Take 1 tablet (5 mg total) by mouth 2 (two) times daily.   docusate sodium (COLACE) 100 MG capsule Take 1 capsule (100 mg total) by mouth 2 (two) times daily.   empagliflozin (JARDIANCE) 10 MG TABS tablet Take 1 tablet (10 mg total) by mouth daily.   ferrous sulfate 325 (65 FE) MG tablet Take 1 tablet (325 mg total) by mouth 3 (three) times daily after meals.   metoprolol succinate (TOPROL-XL) 100 MG 24 hr tablet Take 1 tablet (100 mg total) by mouth 2 (two) times daily. Take with or immediately following a meal.   Multiple Vitamin (MULTIVITAMIN WITH MINERALS) TABS tablet Take 1 tablet by mouth daily.    oxyCODONE-acetaminophen (PERCOCET) 10-325 MG tablet Take 1 tablet by mouth every 4 (four) hours as needed for pain.   polyethylene glycol (MIRALAX / GLYCOLAX) 17 g packet Take 17 g by mouth daily as needed for mild constipation.   propylthiouracil (PTU) 50 MG tablet Take 1 tablet (50 mg total) by mouth 3 (three) times daily.   sacubitril-valsartan (ENTRESTO) 24-26 MG Take 1 tablet by mouth 2 (two) times daily.   Vitamin D, Ergocalciferol, (DRISDOL) 1.25 MG (50000 UNIT) CAPS capsule Take 50,000 Units by mouth once a week.   [DISCONTINUED] spironolactone (ALDACTONE) 25 MG tablet Take 1 tablet (25 mg total) by mouth daily.     Allergies:   Clindamycin/lincomycin and Latex   Social History   Socioeconomic History   Marital status: Widowed    Spouse name: Not on file   Number of children: 1   Years of education: Not on file   Highest education level: Not on file  Occupational History   Occupation: laborer    Comment: [employer]  Tobacco Use   Smoking status: Former    Packs/day: 0.25    Years: 54.00    Total pack years: 13.50    Types: Cigarettes    Start date: 23    Quit date: 10/2019    Years since quitting: 2.5   Smokeless tobacco: Never  Vaping Use   Vaping Use: Never used  Substance and Sexual Activity    Alcohol use: Not Currently    Alcohol/week: 0.0 standard drinks of alcohol   Drug use: No   Sexual activity: Not on file  Other Topics Concern   Not on file  Social History Narrative   Not on file   Social Determinants of Health   Financial Resource Strain: Not on file  Food Insecurity: Not on file  Transportation Needs: Not on file  Physical Activity: Not on file  Stress: Not on file  Social Connections: Not on file     Family History: The patient's family history includes CAD (age of onset: 35) in her brother; Cancer in her brother and father; Diabetes in her brother; Heart attack (age of onset: 29) in her mother; Hypothyroidism in her mother and sister.  ROS:   Please see the history of present illness.     All other  systems reviewed and are negative.  EKGs/Labs/Other Studies Reviewed:    The following studies were reviewed today:  Echo 01/19/22 1. Left ventricular ejection fraction, by estimation, is 30 to 35%. The  left ventricle has moderately decreased function. The left ventricle  demonstrates global hypokinesis. Left ventricular diastolic function could  not be evaluated.   2. Right ventricular systolic function is mildly reduced. The right  ventricular size is normal. There is normal pulmonary artery systolic  pressure. The estimated right ventricular systolic pressure is 27.0 mmHg.   3. Left atrial size was mildly dilated.   4. The mitral valve is grossly normal. Mild mitral valve regurgitation.  No evidence of mitral stenosis.   5. The aortic valve is tricuspid. There is mild calcification of the  aortic valve. There is mild thickening of the aortic valve. Aortic valve  regurgitation is not visualized. Aortic valve sclerosis is present, with  no evidence of aortic valve stenosis.   6. The inferior vena cava is normal in size with <50% respiratory  variability, suggesting right atrial pressure of 8 mmHg.    EKG:  EKG is not ordered today.   Recent  Labs: 01/18/2022: TSH 1.746 01/22/2022: B Natriuretic Peptide 1,960.2 01/25/2022: ALT 13; BUN 16; Creatinine, Ser 0.91; Potassium 3.5; Sodium 137 01/27/2022: Hemoglobin 12.8; Magnesium 2.1; Platelets 497  Recent Lipid Panel    Component Value Date/Time   LDLDIRECT 117 (H) 09/20/2008 2239     Risk Assessment/Calculations:    CHA2DS2-VASc Score = 3   This indicates a 3.2% annual risk of stroke. The patient's score is based upon: CHF History: 1 HTN History: 0 Diabetes History: 0 Stroke History: 0 Vascular Disease History: 0 Age Score: 1 Gender Score: 1       Physical Exam:    VS:  BP (!) 89/64   Pulse 99   Ht '5\' 2"'$  (1.575 m)   Wt 118 lb 3.2 oz (53.6 kg)   SpO2 94%   BMI 21.62 kg/m     Wt Readings from Last 3 Encounters:  05/07/22 118 lb 3.2 oz (53.6 kg)  03/07/22 113 lb 12.8 oz (51.6 kg)  01/31/22 114 lb (51.7 kg)     GEN:  Well nourished, well developed in no acute distress HEENT: Normal NECK: No JVD; No carotid bruits LYMPHATICS: No lymphadenopathy CARDIAC: irregular rhythm regular rate, no murmurs, rubs, gallops RESPIRATORY:  Clear to auscultation without rales, wheezing or rhonchi  ABDOMEN: Soft, non-tender, non-distended MUSCULOSKELETAL:  No edema; No deformity  SKIN: Warm and dry NEUROLOGIC:  Alert and oriented x 3 PSYCHIATRIC:  Normal affect   ASSESSMENT:    1. Chronic systolic CHF (congestive heart failure) ([place])   2. Medication management   3. Hypotension, unspecified hypotension type   4. Atrial fibrillation, persistent ([place])   5. Chronic anticoagulation   6. Smoker   7. Bilateral carotid artery stenosis    PLAN:    In order of problems listed above:  Hypotension Medication confusion Chronic systolic heart failure BP today is 89/64, she is asymptomatic  She did not eat yet this morning GDMT should include: Jardiance 10 mg, Toprol 100 mg, Entresto 24-26 mg twice daily, 25 mg spironolactone We will temporarily stop spironolactone and monitor  symptoms I think its worth trialing her off of 25 mg spironolactone  If symptoms do not improve, may have to trial off of entresto I have also advised to increase protein given hx of low albumin   Persistent atrial fibrillation Chronic anticoagulation Rate is  controlled No syncope   Chronic systolic heart failure GDMT should include: Jardiance 10 mg, Toprol 100 mg, Entresto 24-26 mg twice daily, 25 mg spironolactone We will temporarily stop spironolactone and monitor symptoms If hypotension and symptoms do not improve   Tobacco abuse She is not able to stop smoking   Carotid artery stenosis Ultrasound with 40-59% stenosis on the right Plan follow-up in October 2023   Follow up in 1 month.     Medication Adjustments/Labs and Tests Ordered: Current medicines are reviewed at length with the patient today.  Concerns regarding medicines are outlined above.  No orders of the defined types were placed in this encounter.  No orders of the defined types were placed in this encounter.   Patient Instructions  Medication Instructions:  Stop Spironolactone. *If you need a refill on your cardiac medications before your next appointment, please call your pharmacy*   Lab Work: No Labs If you have labs (blood work) drawn today and your tests are completely normal, you will receive your results only by: [place] (if you have MyChart) OR A paper copy in the mail If you have any lab test that is abnormal or we need to change your treatment, we will call you to review the results.   Testing/Procedures: No Testing   Follow-Up: At [facility], you and your health needs are our priority.  As part of our continuing mission to provide you with exceptional heart care, we have created designated Provider Care Teams.  These Care Teams include your primary Cardiologist (physician) and Advanced Practice Providers (APPs -  Physician Assistants and Nurse Practitioners) who  all work together to provide you with the care you need, when you need it.  We recommend signing up for the patient portal called "MyChart".  Sign up information is provided on this After Visit Summary.  MyChart is used to connect with patients for Virtual Visits (Telemedicine).  Patients are able to view lab/test results, encounter notes, upcoming appointments, etc.  Non-urgent messages can be sent to your provider as well.   To learn more about what you can do with MyChart, go to [email or web address].    Your next appointment:   3-4 week(s)  The format for your next appointment:   In Person  Provider:   Minus Breeding, MD     Other Instructions Log Blood Pressure 2 hours after taking morning medication. Increase protein intake.    Signed, Ledora Bottcher, [place]  05/07/2022 12:24 PM    [place]

## 2022-05-07 ENCOUNTER — Ambulatory Visit: Payer: 59 | Admitting: Physician Assistant

## 2022-05-07 ENCOUNTER — Ambulatory Visit: Payer: 59 | Attending: Physician Assistant | Admitting: Physician Assistant

## 2022-05-07 ENCOUNTER — Encounter: Payer: Self-pay | Admitting: Physician Assistant

## 2022-05-07 VITALS — BP 89/64 | HR 99 | Ht 62.0 in | Wt 118.2 lb

## 2022-05-07 DIAGNOSIS — Z7901 Long term (current) use of anticoagulants: Secondary | ICD-10-CM

## 2022-05-07 DIAGNOSIS — I959 Hypotension, unspecified: Secondary | ICD-10-CM | POA: Diagnosis not present

## 2022-05-07 DIAGNOSIS — I5022 Chronic systolic (congestive) heart failure: Secondary | ICD-10-CM | POA: Diagnosis not present

## 2022-05-07 DIAGNOSIS — F17200 Nicotine dependence, unspecified, uncomplicated: Secondary | ICD-10-CM

## 2022-05-07 DIAGNOSIS — I6523 Occlusion and stenosis of bilateral carotid arteries: Secondary | ICD-10-CM

## 2022-05-07 DIAGNOSIS — Z79899 Other long term (current) drug therapy: Secondary | ICD-10-CM | POA: Diagnosis not present

## 2022-05-07 DIAGNOSIS — I4819 Other persistent atrial fibrillation: Secondary | ICD-10-CM | POA: Diagnosis not present

## 2022-05-07 NOTE — Patient Instructions (Signed)
Medication Instructions:  Stop Spironolactone. *If you need a refill on your cardiac medications before your next appointment, please call your pharmacy*   Lab Work: No Labs If you have labs (blood work) drawn today and your tests are completely normal, you will receive your results only by: [place] (if you have MyChart) OR A paper copy in the mail If you have any lab test that is abnormal or we need to change your treatment, we will call you to review the results.   Testing/Procedures: No Testing   Follow-Up: At [facility], you and your health needs are our priority.  As part of our continuing mission to provide you with exceptional heart care, we have created designated Provider Care Teams.  These Care Teams include your primary Cardiologist (physician) and Advanced Practice Providers (APPs -  Physician Assistants and Nurse Practitioners) who all work together to provide you with the care you need, when you need it.  We recommend signing up for the patient portal called "MyChart".  Sign up information is provided on this After Visit Summary.  MyChart is used to connect with patients for Virtual Visits (Telemedicine).  Patients are able to view lab/test results, encounter notes, upcoming appointments, etc.  Non-urgent messages can be sent to your provider as well.   To learn more about what you can do with MyChart, go to [email or web address].    Your next appointment:   3-4 week(s)  The format for your next appointment:   In Person  Provider:   Minus Breeding, MD     Other Instructions Log Blood Pressure 2 hours after taking morning medication. Increase protein intake.

## 2022-05-12 ENCOUNTER — Telehealth: Payer: Self-pay | Admitting: Cardiology

## 2022-05-12 NOTE — Telephone Encounter (Signed)
*  STAT* If patient is at the pharmacy, call can be transferred to refill team.   1. Which medications need to be refilled? (please list name of each medication and dose if known)   sacubitril-valsartan (ENTRESTO) 24-26 MG    2. Which pharmacy/location (including street and city if local pharmacy) is medication to be sent to?   [place], [place] Pt requesting it be sent back to this pharmacy instead of [facility]   3. Do they need a 30 day or 90 day supply? [place]

## 2022-05-13 MED ORDER — SACUBITRIL-VALSARTAN 24-26 MG PO TABS: 1.0000 | ORAL_TABLET | Freq: Two times a day (BID) | ORAL | 3 refills | Status: DC

## 2022-05-21 ENCOUNTER — Other Ambulatory Visit: Payer: Self-pay | Admitting: Endocrinology

## 2022-05-22 NOTE — Progress Notes (Incomplete)
Ms. 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 *** Using fluoride trays daily? *** Last ENT visit was on: *** Other notable issues, if any: ***

## 2022-05-25 DIAGNOSIS — I959 Hypotension, unspecified: Secondary | ICD-10-CM | POA: Insufficient documentation

## 2022-05-25 DIAGNOSIS — I6521 Occlusion and stenosis of right carotid artery: Secondary | ICD-10-CM | POA: Insufficient documentation

## 2022-05-25 NOTE — Progress Notes (Unsigned)
Cardiology Office Note   Date:  05/26/2022   ID:  Adena, Sima Nov 13, 1950, MRN [id number]  PCP:  Timoteo Gaul, FNP  Cardiologist:   Minus Breeding, MD   Chief Complaint  Patient presents with   Dizziness     History of Present Illness: Rhonda Marshall is a 71 y.o. female who presents for evaluation of atrial fibrillation. She had DCCV in the past but she went back into atrial fib.   Since I last saw her she saw Angie Duke PAc and she was hypotensive and she had spironolactone held.  She has kept a blood pressure diary.  Her blood pressures have been typically in the 90s but she is not having any new symptoms of presyncope or syncope.  She denies any chest pressure, neck or arm discomfort.  She has had no new palpitations, presyncope or syncope.  She does not feel her atrial fibrillation.   Past Medical History:  Diagnosis Date   Anxiety    Atrial fibrillation ([place])    newly diagnosed 10/2010   Family history of adverse reaction to anesthesia    Sister's BP drops    Hypertension    Rheumatoid arthritis(714.0)    Subclinical hyperthyroidism     Past Surgical History:  Procedure Laterality Date   Arm surgery     fracture, right arm   CARDIOVERSION N/A 08/10/2014   Procedure: CARDIOVERSION;  Surgeon: Dorothy Spark, MD;  Location: [facility];  Service: Cardiovascular;  Laterality: N/A;   CARDIOVERSION N/A 01/22/2022   Procedure: CARDIOVERSION;  Surgeon: Freada Bergeron, MD;  Location: [facility] ENDOSCOPY;  Service: Cardiovascular;  Laterality: N/A;   CATARACT EXTRACTION     DIRECT LARYNGOSCOPY N/A 09/02/2019   Procedure: DIRECT LARYNGOSCOPY WITH BIOPSY;  Surgeon: Rozetta Nunnery, MD;  Location: [facility];  Service: ENT;  Laterality: N/A;   HIP ARTHROPLASTY Right 01/19/2022   Procedure: ARTHROPLASTY BIPOLAR HIP (HEMIARTHROPLASTY);  Surgeon: Marchia Bond, MD;  Location: [facility] ORS;  Service: Orthopedics;  Laterality: Right;   IR  GASTROSTOMY TUBE MOD SED  10/17/2019   IR GASTROSTOMY TUBE REMOVAL  01/03/2020   TEE WITHOUT CARDIOVERSION N/A 01/22/2022   Procedure: TRANSESOPHAGEAL ECHOCARDIOGRAM (TEE);  Surgeon: Freada Bergeron, MD;  Location: [facility] ENDOSCOPY;  Service: Cardiovascular;  Laterality: N/A;   TUBAL LIGATION       Current Outpatient Medications  Medication Sig Dispense Refill   acetaminophen (TYLENOL) 325 MG tablet Take 1-2 tablets (325-650 mg total) by mouth every 6 (six) hours as needed for mild pain (pain score 1-3 or temp > 100.5).     ALPRAZolam (XANAX) 1 MG tablet Take 1 mg by mouth 3 (three) times daily as needed.     apixaban (ELIQUIS) 5 MG TABS tablet Take 1 tablet (5 mg total) by mouth 2 (two) times daily. 180 tablet 1   docusate sodium (COLACE) 100 MG capsule Take 1 capsule (100 mg total) by mouth 2 (two) times daily. 10 capsule 0   empagliflozin (JARDIANCE) 10 MG TABS tablet Take 1 tablet (10 mg total) by mouth daily. 90 tablet 3   ferrous sulfate 325 (65 FE) MG tablet Take 1 tablet (325 mg total) by mouth 3 (three) times daily after meals.  3   metoprolol succinate (TOPROL-XL) 100 MG 24 hr tablet Take 1 tablet (100 mg total) by mouth 2 (two) times daily. Take with or immediately following a meal. 180 tablet 3   Multiple Vitamin (MULTIVITAMIN WITH MINERALS) TABS tablet Take 1  tablet by mouth daily.      oxyCODONE-acetaminophen (PERCOCET) 10-325 MG tablet Take 1 tablet by mouth every 4 (four) hours as needed for pain. 30 tablet 0   polyethylene glycol (MIRALAX / GLYCOLAX) 17 g packet Take 17 g by mouth daily as needed for mild constipation. 14 each 0   propylthiouracil (PTU) 50 MG tablet TAKE ONE TABLET BY MOUTH THREE TIMES DAILY 270 tablet 1   sacubitril-valsartan (ENTRESTO) 24-26 MG Take 1 tablet by mouth 2 (two) times daily. 90 tablet 3   Vitamin D, Ergocalciferol, (DRISDOL) 1.25 MG (50000 UNIT) CAPS capsule Take 50,000 Units by mouth once a week.     No current facility-administered medications  for this visit.    Allergies:   Clindamycin/lincomycin and Latex    ROS:  Please see the history of present illness.   Otherwise, review of systems are positive for none.   All other systems are reviewed and negative.    PHYSICAL EXAM: VS:  BP 98/64   Pulse (!) 58   Ht '5\' 2"'$  (1.575 m)   Wt 117 lb 12.8 oz (53.4 kg)   SpO2 96%   BMI 21.55 kg/m  , BMI Body mass index is 21.55 kg/m. GENERAL:  Well appearing NECK:  No jugular venous distention, waveform within normal limits, carotid upstroke brisk and symmetric, no bruits, no thyromegaly LUNGS:  Clear to auscultation bilaterally CHEST:  Unremarkable HEART:  PMI not displaced or sustained,S1 and S2 within normal limits, no S3, no clicks, no rubs, no murmurs, irregular  ABD:  Flat, positive bowel sounds normal in frequency in pitch, no bruits, no rebound, no guarding, no midline pulsatile mass, no hepatomegaly, no splenomegaly EXT:  2 plus pulses throughout, no edema, no cyanosis no clubbing   EKG:  EKG is not ordered today.    Recent Labs: 01/18/2022: TSH 1.746 01/22/2022: B Natriuretic Peptide 1,960.2 01/25/2022: ALT 13; BUN 16; Creatinine, Ser 0.91; Potassium 3.5; Sodium 137 01/27/2022: Hemoglobin 12.8; Magnesium 2.1; Platelets 497    Lipid Panel    Component Value Date/Time   LDLDIRECT 117 (H) 09/20/2008 2239      Wt Readings from Last 3 Encounters:  05/26/22 117 lb 12.8 oz (53.4 kg)  05/07/22 118 lb 3.2 oz (53.6 kg)  03/07/22 113 lb 12.8 oz (51.6 kg)      Other studies Reviewed: Additional studies/ records that were reviewed today include: BP diary Review of the above records demonstrates:  Please see elsewhere in the note.     ASSESSMENT AND PLAN:  ATRIAL FIB:     Ms. Alliana Mcauliff has a CHA2DS2 - VASc score of 2.  At the last visit I discontinued her amiodarone since she was not maintaining sinus rhythm.  She does not feel her fibrillation so we will continue with rate control and anticoagulation.    HYPOTENSION:     Her blood pressure is running somewhat low and we had to discontinue the spironolactone.  However, I think she is tolerating the others and no change in therapy.  She will let me know if she has continued low blood pressures to become symptomatic at which point I might have to back off on her medicines.   TOBACCO ABUSE:    She is not able to quit smoking.  HYPERTHYROID:    She follows with Dr. Dwyane Dee.     CARDIOMYOPATHY:    She has had a mildly reduced ejection fraction.   She will not tolerate increased meds.  No change in therapy.  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  We will schedule follow up.   current medicines are reviewed at length with the patient today.  The patient does not have concerns regarding medicines.  The following changes have been made: None  Labs/ tests ordered today include:  None   No orders of the defined types were placed in this encounter.   Disposition:   FU with Doreene Adas, PhD, PAc in 6 months.     Signed, Minus Breeding, MD  05/26/2022 12:34 PM    [facility]

## 2022-05-26 ENCOUNTER — Ambulatory Visit: Payer: 59 | Attending: Cardiology | Admitting: Cardiology

## 2022-05-26 ENCOUNTER — Telehealth: Payer: Self-pay | Admitting: *Deleted

## 2022-05-26 ENCOUNTER — Encounter: Payer: Self-pay | Admitting: Cardiology

## 2022-05-26 VITALS — BP 98/64 | HR 58 | Ht 62.0 in | Wt 117.8 lb

## 2022-05-26 DIAGNOSIS — I4819 Other persistent atrial fibrillation: Secondary | ICD-10-CM

## 2022-05-26 DIAGNOSIS — I6521 Occlusion and stenosis of right carotid artery: Secondary | ICD-10-CM

## 2022-05-26 DIAGNOSIS — Z7901 Long term (current) use of anticoagulants: Secondary | ICD-10-CM

## 2022-05-26 DIAGNOSIS — I959 Hypotension, unspecified: Secondary | ICD-10-CM | POA: Diagnosis not present

## 2022-05-26 DIAGNOSIS — E0590 Thyrotoxicosis, unspecified without thyrotoxic crisis or storm: Secondary | ICD-10-CM

## 2022-05-26 NOTE — Telephone Encounter (Signed)
RETURNED PATIENT'S DAUGHTER'S PHONE CALL(PATRICIA), SPOKE WITH PATIENT'S DAUGHTER

## 2022-05-26 NOTE — Patient Instructions (Signed)
Medication Instructions:  Your physician recommends that you continue on your current medications as directed. Please refer to the Current Medication list given to you today.  *If you need a refill on your cardiac medications before your next appointment, please call your pharmacy*  Lab Work: NONE ordered at this time of appointment   If you have labs (blood work) drawn today and your tests are completely normal, you will receive your results only by: [place] (if you have MyChart) OR A paper copy in the mail If you have any lab test that is abnormal or we need to change your treatment, we will call you to review the results.  Testing/Procedures: NONE ordered at this time of appointment   Follow-Up: At [facility], you and your health needs are our priority.  As part of our continuing mission to provide you with exceptional heart care, we have created designated Provider Care Teams.  These Care Teams include your primary Cardiologist (physician) and Advanced Practice Providers (APPs -  Physician Assistants and Nurse Practitioners) who all work together to provide you with the care you need, when you need it.  Your next appointment:   6 month(s)  The format for your next appointment:   In Person  Provider:   Fabian Sharp, PA-C        Other Instructions  Important Information About Sugar

## 2022-05-27 ENCOUNTER — Ambulatory Visit
Admission: RE | Admit: 2022-05-27 | Discharge: 2022-05-27 | Disposition: A | Discharge status: Home / Self Care | Payer: 59 | Source: Ambulatory Visit | Attending: Radiation Oncology | Admitting: Radiation Oncology

## 2022-05-27 DIAGNOSIS — C320 Malignant neoplasm of glottis: Secondary | ICD-10-CM

## 2022-05-30 ENCOUNTER — Ambulatory Visit
Admission: RE | Admit: 2022-05-30 | Discharge: 2022-05-30 | Disposition: A | Discharge status: Home / Self Care | Payer: 59 | Source: Ambulatory Visit | Attending: Radiation Oncology | Admitting: Radiation Oncology

## 2022-05-30 ENCOUNTER — Encounter: Payer: Self-pay | Admitting: Radiation Oncology

## 2022-05-30 VITALS — BP 118/64 | HR 83 | Temp 97.7°F | Resp 18 | Ht 62.0 in | Wt 119.4 lb

## 2022-05-30 DIAGNOSIS — C320 Malignant neoplasm of glottis: Secondary | ICD-10-CM | POA: Diagnosis present

## 2022-05-30 DIAGNOSIS — Z7984 Long term (current) use of oral hypoglycemic drugs: Secondary | ICD-10-CM | POA: Diagnosis not present

## 2022-05-30 DIAGNOSIS — Z923 Personal history of irradiation: Secondary | ICD-10-CM | POA: Insufficient documentation

## 2022-05-30 DIAGNOSIS — Z79899 Other long term (current) drug therapy: Secondary | ICD-10-CM | POA: Insufficient documentation

## 2022-05-30 DIAGNOSIS — Z7901 Long term (current) use of anticoagulants: Secondary | ICD-10-CM | POA: Insufficient documentation

## 2022-05-30 MED ORDER — OXYMETAZOLINE HCL 0.05 % NA SOLN
1.0000 | Freq: Once | NASAL | Status: AC
  Administered 2022-05-30: 1 via NASAL
  Filled 2022-05-30: qty 30

## 2022-05-30 NOTE — Progress Notes (Signed)
Rhonda Marshall presents today for follow-up after completing radiation to the larynx on 10/31/2018    Pain issues, if any: having arthritic pain in legs/ hands Using a feeding tube?: no Weight changes, if any: has gained weight Swallowing issues, if any: no swallowing issues Smoking or chewing tobacco? no Using fluoride trays daily? no Last ENT visit was on: rosen in August, did do a scope Other notable issues, if any: none at this time  Wt Readings from Last 3 Encounters:  05/30/22 119 lb 6.4 oz (54.2 kg)  05/26/22 117 lb 12.8 oz (53.4 kg)  05/07/22 118 lb 3.2 oz (53.6 kg)   Vitals:   05/30/22 1123  BP: 118/64  Pulse: 83  Resp: 18  Temp: 97.7 F (36.5 C)  SpO2: 100%

## 2022-05-30 NOTE — Progress Notes (Signed)
Radiation Oncology         [phone] ________________________________  Name: Rhonda Marshall MRN: [id number]  Date: 05/30/2022  DOB: 22-Nov-1950  Follow-Up Visit Note  CC: Timoteo Gaul, FNP  Rozetta Nunnery, *  Diagnosis and Prior Radiotherapy:       ICD-10-CM   1. Malignant neoplasm of glottis ([place])  C32.0       Cancer Staging  Malignant neoplasm of glottis ([place]) Staging form: Larynx - Glottis, AJCC 8th Edition - Clinical stage from 09/09/2019: Stage I (cT1b, cN0, cM0) - Signed by Eppie Gibson, MD on 09/09/2019 Stage prefix: Initial diagnosis  Intent: Curative  Radiation Treatment Dates: 09/22/2019 through 10/31/2019  Site Technique Total Dose (Gy) Dose per Fx (Gy) Completed Fx Beam Energies  Larynx: HN_larynx 3D 63/63 2.25 28/28 6X   CHIEF COMPLAINT:  Here for follow-up and surveillance of glottic cancer  Narrative:     Ms. Arrowood presents today for follow-up after completing radiation to the larynx on 10/31/2018    Pain issues, if any: having arthritic pain in legs/ hands Using a feeding tube?: no Weight changes, if any: has gained weight Swallowing issues, if any: no swallowing issues Smoking or chewing tobacco? no Using fluoride trays daily? no Last ENT visit was on: Rosen in August, did do a scope Other notable issues, if any: none at this time  Wt Readings from Last 3 Encounters:  05/30/22 119 lb 6.4 oz (54.2 kg)  05/26/22 117 lb 12.8 oz (53.4 kg)  05/07/22 118 lb 3.2 oz (53.6 kg)   Vitals:   05/30/22 1123  BP: 118/64  Pulse: 83  Resp: 18  Temp: 97.7 F (36.5 C)  SpO2: 100%     ALLERGIES:  is allergic to clindamycin/lincomycin and latex.  Meds: Current Outpatient Medications  Medication Sig Dispense Refill   ALPRAZolam (XANAX) 1 MG tablet Take 1 mg by mouth 3 (three) times daily as needed.     apixaban (ELIQUIS) 5 MG TABS tablet Take 1 tablet (5 mg total) by mouth 2 (two) times daily. 180 tablet 1   empagliflozin (JARDIANCE) 10 MG  TABS tablet Take 1 tablet (10 mg total) by mouth daily. 90 tablet 3   ferrous sulfate 325 (65 FE) MG tablet Take 1 tablet (325 mg total) by mouth 3 (three) times daily after meals.  3   metoprolol succinate (TOPROL-XL) 100 MG 24 hr tablet Take 1 tablet (100 mg total) by mouth 2 (two) times daily. Take with or immediately following a meal. 180 tablet 3   Multiple Vitamin (MULTIVITAMIN WITH MINERALS) TABS tablet Take 1 tablet by mouth daily.      oxyCODONE-acetaminophen (PERCOCET) 10-325 MG tablet Take 1 tablet by mouth every 4 (four) hours as needed for pain. 30 tablet 0   propylthiouracil (PTU) 50 MG tablet TAKE ONE TABLET BY MOUTH THREE TIMES DAILY 270 tablet 1   sacubitril-valsartan (ENTRESTO) 24-26 MG Take 1 tablet by mouth 2 (two) times daily. 90 tablet 3   Vitamin D, Ergocalciferol, (DRISDOL) 1.25 MG (50000 UNIT) CAPS capsule Take 50,000 Units by mouth once a week.     acetaminophen (TYLENOL) 325 MG tablet Take 1-2 tablets (325-650 mg total) by mouth every 6 (six) hours as needed for mild pain (pain score 1-3 or temp > 100.5). (Patient not taking: Reported on 05/30/2022)     docusate sodium (COLACE) 100 MG capsule Take 1 capsule (100 mg total) by mouth 2 (two) times daily. (Patient not taking: Reported on 05/30/2022) 10 capsule 0  polyethylene glycol (MIRALAX / GLYCOLAX) 17 g packet Take 17 g by mouth daily as needed for mild constipation. (Patient not taking: Reported on 05/30/2022) 14 each 0   No current facility-administered medications for this encounter.    Physical Findings: The patient is in no acute distress. Patient is alert and oriented. Wt Readings from Last 3 Encounters:  05/30/22 119 lb 6.4 oz (54.2 kg)  05/26/22 117 lb 12.8 oz (53.4 kg)  05/07/22 118 lb 3.2 oz (53.6 kg)    height is '5\' 2"'$  (1.575 m) and weight is 119 lb 6.4 oz (54.2 kg). Her oral temperature is 97.7 F (36.5 C). Her blood pressure is 118/64 and her pulse is 83. Her respiration is 18 and oxygen saturation is  100%. .  General: Alert and oriented, in no acute distress HEENT: Head is normocephalic. Extraocular movements are intact.  No lesions in upper mouth are throat.  Majority of teeth are missing  Neck:  well-healed skin with no palpable lymphadenopathy; + moderate anterior lymphedema Skin: Skin over neck is warm, dry, intact, mild pigment changes in RT field Psychiatric: Judgment and insight are intact. Affect is appropriate.  PROCEDURE NOTE: After obtaining consent and anesthetizing the nasal cavity with topical oxymetazoline oral decongestant, the flexible endoscope was coated with lidocaine gel and introduced and passed through the nasal cavity.  No visible lesions in the nasopharynx, pharynx, hypopharynx.  The true vocal cords are symmetrically mobile.  Improved erythema noted throughout the majority of the left true vocal cord and the adjacent false cord compared to previous exam last year.  She tolerated the procedure well   Lab Findings: Lab Results  Component Value Date   WBC 7.3 01/27/2022   HGB 12.8 01/27/2022   HCT 39.6 01/27/2022   MCV 98.3 01/27/2022   PLT 497 (H) 01/27/2022    Lab Results  Component Value Date   TSH 1.746 01/18/2022    Radiographic Findings: No results found.  Impression/Plan: This is a delightful woman with a history of glottic cancer, status post radiation therapy.    1) Head and Neck Cancer Status: She is now following with otolaryngology.  3 months ago Dr. Constance Holster was not clinically concerned by the erythema in her larynx.  It looks better to me today compared to a year ago.  She is in remission.  2) Nutritional Status: No active issues   3) Risk Factors: The patient has been educated about risk factors including alcohol and tobacco abuse; they understand that avoidance of alcohol and tobacco is important to prevent recurrences as well as other cancers   4) Swallowing: Good function, continue speech-language pathology exercises.  5)  Thyroid  function: follows w/ endocrinology Lab Results  Component Value Date   TSH 1.746 01/18/2022    6) She has lymphedema in her neck but prefers to watch this rather than see physical therapy   7) Follow-up in 12 months - sooner if needed  On date of service, in total, I spent 30 minutes on this encounter.  She was seen in person.   _____________________________________   Eppie Gibson, MD

## 2022-07-01 ENCOUNTER — Telehealth: Payer: Self-pay | Admitting: *Deleted

## 2022-07-01 NOTE — Telephone Encounter (Signed)
   Pre-operative Risk Assessment    Patient Name: Oma Marzan  DOB: [birth year] MRN: [id number]     Request for Surgical Clearance    Procedure:  Dental Extraction - Amount of Teeth to be Pulled:  10  Date of Surgery:  Clearance TBD                                 Surgeon:  Durene Cal DDS Surgeon's Group or Practice Name:  [facility] Phone number:  [phone] Fax number:  [phone]   Type of Clearance Requested:   - Medical  - Pharmacy:  Hold Apixaban (Eliquis)     Type of Anesthesia:  Local    Additional requests/questions:    Hervey Ard   07/01/2022, 2:25 PM

## 2022-07-02 NOTE — Telephone Encounter (Addendum)
   Patient Name: Rhonda Marshall  DOB: 22-Feb-1951 MRN: [id number]  Primary Cardiologist: Minus Breeding, MD  Chart reviewed as part of pre-operative protocol coverage. Patient was last seen by Dr. Percival Spanish on 05/12/2022 at which time she was stable from a cardiac standpoint. I called the patient today for additional pre-op evaluation. She reports no significant changes since last visit. She has some chronic dyspnea on exertion but this is not new and is stable. She denies any chest pain, palpitations, or syncope. Dental extractions are low risk procedures. Given past medical history and time since last visit, based on ACC/AHA guidelines, Janese Waugh is at acceptable risk for the planned procedure without further cardiovascular testing.   Patient can hold Eliquis for 1-2 days prior to procedures. Please restart this as soon as safely possible afterwards.   SBE prophylaxis is not required for the patient from a cardiac standpoint.   I will route this recommendation to the requesting party via Epic fax function and remove from pre-op pool.  Please call with questions.  Darreld Mclean, PA-C 07/02/2022, 12:04 PM

## 2022-07-02 NOTE — Telephone Encounter (Signed)
Patient with diagnosis of atrial fibriliation on Eliquis for anticoagulation.    Procedure: Dental Extraction - Amount of Teeth to be Pulled: 10  Date of procedure: TBD   CHA2DS2-VASc Score = 3  This indicates a 3.2% annual risk of stroke. The patient's score is based upon: CHF History: 1 HTN History: 0 Diabetes History: 0 Stroke History: 0 Vascular Disease History: 0 Age Score: 1 Gender Score: 1    CrCl 56 mL/min (Sr cr. 0.91 01/25/2022) Platelet count 497 01/27/2022 Patient does not require pre-op antibiotics for dental procedure.  Per office protocol, patient can hold Eliquis for 1-2 days prior to procedure.     **This guidance is not considered finalized until pre-operative APP has relayed final recommendations.**

## 2022-07-16 ENCOUNTER — Ambulatory Visit (HOSPITAL_COMMUNITY)
Admission: RE | Admit: 2022-07-16 | Discharge: 2022-07-16 | Disposition: A | Discharge status: Home / Self Care | Payer: 59 | Source: Ambulatory Visit | Attending: Cardiovascular Disease | Admitting: Cardiovascular Disease

## 2022-07-16 DIAGNOSIS — I6523 Occlusion and stenosis of bilateral carotid arteries: Secondary | ICD-10-CM | POA: Diagnosis not present

## 2022-07-21 ENCOUNTER — Encounter: Payer: Self-pay | Admitting: *Deleted

## 2022-08-20 ENCOUNTER — Ambulatory Visit (INDEPENDENT_AMBULATORY_CARE_PROVIDER_SITE_OTHER): Payer: 59

## 2022-08-20 ENCOUNTER — Ambulatory Visit: Payer: 59

## 2022-08-20 ENCOUNTER — Ambulatory Visit: Payer: 59 | Attending: Internal Medicine | Admitting: Internal Medicine

## 2022-08-20 ENCOUNTER — Encounter: Payer: Self-pay | Admitting: Internal Medicine

## 2022-08-20 ENCOUNTER — Encounter: Payer: Self-pay | Admitting: Radiation Oncology

## 2022-08-20 VITALS — BP 114/71 | HR 88 | Resp 18 | Ht 60.5 in | Wt 119.0 lb

## 2022-08-20 DIAGNOSIS — M05711 Rheumatoid arthritis with rheumatoid factor of right shoulder without organ or systems involvement: Secondary | ICD-10-CM | POA: Diagnosis not present

## 2022-08-20 DIAGNOSIS — Z8781 Personal history of (healed) traumatic fracture: Secondary | ICD-10-CM | POA: Insufficient documentation

## 2022-08-20 DIAGNOSIS — M542 Cervicalgia: Secondary | ICD-10-CM

## 2022-08-20 DIAGNOSIS — S72001A Fracture of unspecified part of neck of right femur, initial encounter for closed fracture: Secondary | ICD-10-CM

## 2022-08-20 NOTE — Progress Notes (Signed)
Office Visit Note  Patient: Rhonda Marshall             Date of Birth: 1950-10-28           MRN: [id number]             PCP: Timoteo Gaul, FNP Referring: Almyra Free, MD Visit Date: 08/20/2022   Subjective:  New Patient (Initial Visit) (Patient states she has arthritis.)   History of Present Illness: Rhonda Marshall is a 72 y.o. female here for evaluation and management of rheumatoid arthritis. She has never seen rheumatology before and has been treated by her PCP Dr. Rex Kras with intermittent low dose oral steroids for joint inflammation. She has had pain and stiffness in her neck and shoulders and hands going back to her 57s. Episodically experienced swelling as well and progressive deformity in her right hand. She usually experiences almost complete symptom improvement in 1 day after starting prednisone. She also sees orthopedic surgery and pain management for long term arthritis in multiple areas including her back. She had right hip femur fracture after a fall at the beach tripping on her sandal. Her worst problem lately has bene in the right knee with pain and episodic swelling. She had intraarticular steroid injection a few months ago with great improvement in knee pain.  Otherwise using as needed high-dose ibuprofen but only takes this occasionally. Medical history also significant for esophageal cancer.  She has significant cardiovascular disease history with congestive heart failure previously attempted direct-current cardioversion for A-fib that was not successful so on long-term eliquis.   Activities of Daily Living:  Patient reports morning stiffness for 2 hours.   Patient Reports nocturnal pain.  Difficulty dressing/grooming: Denies Difficulty climbing stairs: Reports Difficulty getting out of chair: Reports Difficulty using hands for taps, buttons, cutlery, and/or writing: Reports  Review of Systems  Constitutional:  Positive for fatigue.  HENT:  Positive  for mouth dryness. Negative for mouth sores.   Eyes:  Negative for dryness.  Respiratory:  Negative for shortness of breath.   Cardiovascular:  Negative for chest pain and palpitations.  Gastrointestinal:  Positive for diarrhea. Negative for blood in stool and constipation.  Endocrine: Negative for increased urination.  Genitourinary:  Negative for involuntary urination.  Musculoskeletal:  Positive for joint pain, joint pain and morning stiffness. Negative for gait problem, joint swelling, myalgias, muscle weakness, muscle tenderness and myalgias.  Skin:  Negative for color change, rash, hair loss and sensitivity to sunlight.  Allergic/Immunologic: Negative for susceptible to infections.  Neurological:  Negative for dizziness and headaches.  Hematological:  Negative for swollen glands.  Psychiatric/Behavioral:  Negative for depressed mood and sleep disturbance. The patient is not nervous/anxious.     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)    newly diagnosed 10/2010   Family history of adverse reaction to anesthesia    Sister's BP drops    Hip fracture ([place]) 01/18/2022   Hypertension    Rheumatoid arthritis(714.0)    Subclinical  hyperthyroidism     Family History  Problem Relation Age of Onset   Heart attack Mother 37       pacemaker age 49   Hypothyroidism Mother    Cancer Father        colon and prostate   Hypothyroidism Sister    Cancer Sister    Diabetes Brother    CAD Brother 22   Cancer Brother        Renal   Past Surgical History:  Procedure Laterality Date   Arm surgery     fracture, right arm   CARDIOVERSION N/A 08/10/2014   Procedure: CARDIOVERSION;  Surgeon: Dorothy Spark, MD;  Location: [place];  Service: Cardiovascular;  Laterality: N/A;   CARDIOVERSION N/A 01/22/2022   Procedure: CARDIOVERSION;  Surgeon: Freada Bergeron, MD;  Location[facility] ENDOSCOPY;  Service: Cardiovascular;  Laterality: N/A;   CATARACT EXTRACTION     DIRECT LARYNGOSCOPY N/A 09/02/2019   Procedure: DIRECT LARYNGOSCOPY WITH BIOPSY;  Surgeon: Rozetta Nunnery, MD;  Location: [place];  Service: ENT;  Laterality: N/A;   HIP ARTHROPLASTY Right 01/19/2022   Procedure: ARTHROPLASTY BIPOLAR HIP (HEMIARTHROPLASTY);  Surgeon: Marchia Bond, MD;  Location: [facility];  Service: Orthopedics;  Laterality: Right;   IR GASTROSTOMY TUBE MOD SED  10/17/2019   IR GASTROSTOMY TUBE REMOVAL  01/03/2020   TEE WITHOUT CARDIOVERSION N/A 01/22/2022   Procedure: TRANSESOPHAGEAL ECHOCARDIOGRAM (TEE);  Surgeon: Freada Bergeron, MD;  Location: [facility] ENDOSCOPY;  Service: Cardiovascular;  Laterality: N/A;   TUBAL LIGATION     Social History   Social History Narrative   Not on file   Immunization History  Administered Date(s) Administered   Influenza,inj,Quad PF,6+ Mos 07/01/2017     Objective: Vital Signs: BP 114/71 (BP Location: Left Arm, Patient Position: Sitting, Cuff Size: Small)   Pulse 88   Resp 18   Ht 5' 0.5" (1.537 m)   Wt 119 lb (54 kg)   BMI 22.86 kg/m    Physical Exam Eyes:     Conjunctiva/sclera: Conjunctivae normal.  Cardiovascular:     Rate and Rhythm: Normal rate. Rhythm irregular.   Pulmonary:     Effort: Pulmonary effort is normal.     Breath sounds: Normal breath sounds.  Lymphadenopathy:     Cervical: No cervical adenopathy.  Skin:    General: Skin is warm and dry.     Findings: No rash.  Neurological:     Mental Status: She is alert.  Psychiatric:        Mood and Affect: Mood normal.      Musculoskeletal Exam:  Neck full ROM no tenderness Shoulders full ROM no tenderness or swelling Elbows full ROM no tenderness or swelling Mild wrist ulnar subluxation and ulnar side of wrist nodules, nonreducible, right hand chronic appearing MCP joint widening, subluxation, and lateral deviation with reduced extension ROM especially in 4th-5th fingers, left hand crepitus and pain in 3rd finger joints no lateral deviation and near normal ROM Hip normal internal and external rotation without pain, no tenderness to lateral hip palpation Right knee crepitus and slightly reduced ROM, minimal tenderness to pressure, left knee normal Ankles full ROM no tenderness or swelling   CDAI Exam: CDAI Score: 11  Patient Global: 30 mm; Provider Global: 10 mm Swollen: 1 ; Tender: 6  Joint Exam 08/20/2022      Right  Left  MCP 1  Swollen Tender     MCP 2   Tender     MCP 3   Tender   Tender  PIP 3      Tender  Knee   Tender        Investigation: No additional findings.  Imaging: XR Hand 2 View Right  Result Date: 09/10/2022 X-ray right hand 2 views Surgical hardware position in that radius.  Extensive cystic changes throughout carpal bones without definite visible erosions.  Severe MCP joint space loss with subluxation and ulnar deviation throughout digits most severe at second and third.  Possible erosive change versus very asymmetric joint space loss and deviation at the third PIP.  DIP joints are relatively preserved.  Bone mineralization consistent with generalized osteopenia. Impression Proximal joint changes consistent with longstanding erosive rheumatoid arthritis, most  severely affecting throughout MCP joints  XR Hand 2 View Left  Result Date: 09/10/2022 X-ray left hand 2 views Radiocarpal joint space narrowing and erosion of the ulnar styloid process.  Multiple cystic changes and probable erosion along the radial border of scaphoid.  Loss of scapholunate space probable slight subluxation and there is squaring of the first [facility] joint.  MCP joint space narrowing and subluxation most advanced at the third digit with probable remote marginal erosive change.  PIP and DIP joint spaces are relatively preserved.  Bone mineralization consistent with generalized osteopenia. Impression Longstanding erosive disease involving wrist and third MCP joint consistent with history of rheumatoid arthritis  XR Shoulder Left  Result Date: 09/10/2022 X-ray left shoulder 4 views There is mild glenohumeral joint space narrowing and marginal osteophyte formation.  Acromiohumeral distance appears normal.  AC joint with is normal but there is small periarticular calcification present superiorly.  No visible joint effusion. 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  Acromiohumeral distance is normal.  AC joint appears normal.  No visible joint effusion or abnormal calcifications. Impression No significant visible arthritis changes  XR Cervical Spine 2 or 3 views  Result Date: 09/10/2022 X-ray cervical spine 2 views AP and lateral No acute appearing bony abnormality.  There are multilevel degenerative changes with lateral osteophyte processes appear more advanced on the left side worst and worse between C3-C6.  Anterior endplate osteophytes visible at C5 and C6.  No significant loss of disc space height but there is possible slight anterolisthesis of C4 on C5.  Soft tissue calcifications present probably consistent  with previous history of radiation therapy for esophageal cancer.   Recent Labs: Lab Results  Component Value Date   WBC 7.3 01/27/2022   HGB 12.8 01/27/2022   PLT 497 (H) 01/27/2022   NA 137 01/25/2022   K 3.5 01/25/2022   CL 105 01/25/2022   CO2 22 01/25/2022   GLUCOSE 90 01/25/2022   BUN 16 01/25/2022   CREATININE 0.91 01/25/2022   BILITOT 0.5 01/25/2022   ALKPHOS 85 01/25/2022   AST 16 01/25/2022   ALT 13 01/25/2022   PROT 6.3 (L) 01/25/2022   ALBUMIN 2.9 (L) 01/25/2022   CALCIUM 9.1 01/25/2022   GFRAA >60 10/20/2019    Speciality Comments: No specialty comments available.  Procedures:  No procedures performed Allergies: Clindamycin/lincomycin and Latex   Assessment /  Plan:     Visit Diagnoses: Rheumatoid arthritis involving right shoulder with positive rheumatoid factor ([place]) - Plan: XR Hand 2 View Right, XR Hand 2 View Left, XR Shoulder Right, XR Shoulder Left, Sedimentation rate, C-reactive protein, Rheumatoid factor, Cyclic citrul peptide antibody, IgG  Longstanding history of rheumatoid arthritis but not really on disease specific treatment.  Unfortunately appears to have some degree of chronic joint damage.  X-ray of bilateral hands and shoulders shows chronic erosive disease changes in proximal joints of both hands, left shoulder glenohumeral joint arthritis but right shoulder is pretty well intact.  Also checking sed rate and CRP for current disease activity assessment with not a lot of peripheral joint synovitis on exam today.  S/P right hip fracture  Osteoporosis defining fracture severe underlying risk factors with her rheumatoid arthritis and long-term use of intermittent steroids.  Neck pain - Plan: XR Cervical Spine 2 or 3 views  Persistent joint pain without much loss and range of motion cervical spine x-ray obtained today showing multilevel degenerative changes and anterior calcifications consistent with previous radiation for laryngeal cancer.  I do not  see any clear evidence of instability or significant subluxation.  Orders: Orders Placed This Encounter  Procedures   XR Hand 2 View Right   XR Hand 2 View Left   XR Shoulder Right   XR Shoulder Left   XR Cervical Spine 2 or 3 views   Sedimentation rate   C-reactive protein   Rheumatoid factor   Cyclic citrul peptide antibody, IgG   No orders of the defined types were placed in this encounter.    Follow-Up Instructions: Return in about 3 weeks (around 09/10/2022) for New pt RA f/u 3wks.   Collier Salina, MD  Note - This record has been created using [employer].  Chart creation errors have been sought, but may not always  have been located. Such creation errors do not reflect on  the standard of medical care.

## 2022-08-22 LAB — RHEUMATOID FACTOR: Rheumatoid fact SerPl-aCnc: 117 [IU]/mL — ABNORMAL HIGH

## 2022-08-22 LAB — SEDIMENTATION RATE: Sed Rate: 25 mm/h (ref 0–30)

## 2022-08-22 LAB — CYCLIC CITRUL PEPTIDE ANTIBODY, IGG: Cyclic Citrullin Peptide Ab: 249 UNITS — ABNORMAL HIGH

## 2022-08-22 LAB — C-REACTIVE PROTEIN: CRP: 6.2 mg/L (ref <8.0)

## 2022-09-10 ENCOUNTER — Ambulatory Visit: Payer: 59 | Attending: Internal Medicine | Admitting: Internal Medicine

## 2022-09-10 ENCOUNTER — Encounter: Payer: Self-pay | Admitting: Internal Medicine

## 2022-09-10 VITALS — BP 145/87 | HR 150 | Resp 20 | Ht 61.0 in | Wt 124.0 lb

## 2022-09-10 DIAGNOSIS — G8929 Other chronic pain: Secondary | ICD-10-CM

## 2022-09-10 DIAGNOSIS — M25511 Pain in right shoulder: Secondary | ICD-10-CM

## 2022-09-10 DIAGNOSIS — M05711 Rheumatoid arthritis with rheumatoid factor of right shoulder without organ or systems involvement: Secondary | ICD-10-CM | POA: Diagnosis not present

## 2022-09-10 DIAGNOSIS — M25512 Pain in left shoulder: Secondary | ICD-10-CM | POA: Diagnosis not present

## 2022-09-10 MED ORDER — HYDROXYCHLOROQUINE SULFATE 200 MG PO TABS: 200.0000 mg | ORAL_TABLET | Freq: Every day | ORAL | 2 refills | Status: DC

## 2022-09-10 NOTE — Patient Instructions (Signed)
Hydroxychloroquine Tablets What is this medication? HYDROXYCHLOROQUINE (hye drox ee KLOR oh [place]) treats autoimmune conditions, such as rheumatoid arthritis and lupus. It works by slowing down an overactive immune system. It may also be used to prevent and treat malaria. It works by killing the parasite that causes malaria. It belongs to a group of medications called DMARDs. This medicine may be used for other purposes; ask your health care provider or pharmacist if you have questions. COMMON BRAND NAME(S): Plaquenil, Quineprox What should I tell my care team before I take this medication? They need to know if you have any of these conditions: Diabetes Eye disease, vision problems Frequently drink alcohol G6PD deficiency Heart disease Irregular heartbeat or rhythm Kidney disease Liver disease Porphyria Psoriasis An unusual or allergic reaction to hydroxychloroquine, other medications, foods, dyes, or preservatives Pregnant or trying to get pregnant Breastfeeding How should I use this medication? Take this medication by mouth with water. Take it as directed on the prescription label. Do not cut, crush, or chew this medication. Swallow the tablets whole. Take it with food. Do not take it more than directed. Take all of this medication unless your care team tells you to stop it early. Keep taking it even if you think you are better. Take products with antacids in them at a different time of day than this medication. Take this medication 4 hours before or 4 hours after antacids. Talk to your care team if you have questions. Talk to your care team about the use of this medication in children. While this medication may be prescribed for selected conditions, precautions do apply. Overdosage: If you think you have taken too much of this medicine contact a poison control center or emergency room at once. NOTE: This medicine is only for you. Do not share this medicine with others. What if I miss a  dose? If you miss a dose, take it as soon as you can. If it is almost time for your next dose, take only that dose. Do not take double or extra doses. What may interact with this medication? Do not take this medication with any of the following: Cisapride Dronedarone Pimozide Thioridazine This medication may also interact with the following: Ampicillin Antacids Cimetidine Cyclosporine Digoxin Kaolin Medications for diabetes, such as insulin, glipizide, glyburide Medications for seizures, such as carbamazepine, phenobarbital, phenytoin Mefloquine Methotrexate Other medications that cause heart rhythm changes Praziquantel This list may not describe all possible interactions. Give your health care provider a list of all the medicines, herbs, non-prescription drugs, or dietary supplements you use. Also tell them if you smoke, drink alcohol, or use illegal drugs. Some items may interact with your medicine. What should I watch for while using this medication? Visit your care team for regular checks on your progress. Tell your care team if your symptoms do not start to get better or if they get worse. You may need blood work done while you are taking this medication. If you take other medications that can affect heart rhythm, you may need more testing. Talk to your care team if you have questions. Your vision may be tested before and during use of this medication. Tell your care team right away if you have any change in your eyesight. This medication may cause serious skin reactions. They can happen weeks to months after starting the medication. Contact your care team right away if you notice fevers or flu-like symptoms with a rash. The rash may be red or purple and then turn  into blisters or peeling of the skin. Or, you might notice a red rash with swelling of the face, lips or lymph nodes in your neck or under your arms. If you or your family notice any changes in your behavior, such as new or  worsening depression, thoughts of harming yourself, anxiety, or other unusual or disturbing thoughts, or memory loss, call your care team right away. What side effects may I notice from receiving this medication? Side effects that you should report to your care team as soon as possible: Allergic reactions--skin rash, itching, hives, swelling of the face, lips, tongue, or throat Aplastic anemia--unusual weakness or fatigue, dizziness, headache, trouble breathing, increased bleeding or bruising Change in vision Heart rhythm changes--fast or irregular heartbeat, dizziness, feeling faint or lightheaded, chest pain, trouble breathing Infection--fever, chills, cough, or sore throat Low blood sugar (hypoglycemia)--tremors or shaking, anxiety, sweating, cold or clammy skin, confusion, dizziness, rapid heartbeat Muscle injury--unusual weakness or fatigue, muscle pain, dark yellow or brown urine, decrease in amount of urine Pain, tingling, or numbness in the hands or feet Rash, fever, and swollen lymph nodes Redness, blistering, peeling, or loosening of the skin, including inside the mouth Thoughts of suicide or self-harm, worsening mood, or feelings of depression Unusual bruising or bleeding Side effects that usually do not require medical attention (report to your care team if they continue or are bothersome): Diarrhea Headache Nausea Stomach pain Vomiting This list may not describe all possible side effects. Call your doctor for medical advice about side effects. You may report side effects to FDA at 1-800-FDA-1088. Where should I keep my medication? Keep out of the reach of children and pets. Store at room temperature up to 30 degrees C (86 degrees F). Protect from light. Get rid of any unused medication after the expiration date. To get rid of medications that are no longer needed or have expired: Take the medication to a medication take-back program. Check with your pharmacy or law enforcement  to find a location. If you cannot return the medication, check the label or package insert to see if the medication should be thrown out in the garbage or flushed down the toilet. If you are not sure, ask your care team. If it is safe to put it in the trash, empty the medication out of the container. Mix the medication with cat litter, dirt, coffee grounds, or other unwanted substance. Seal the mixture in a bag or container. Put it in the trash. NOTE: This sheet is a summary. It may not cover all possible information. If you have questions about this medicine, talk to your doctor, pharmacist, or health care provider.  2023 Elsevier/Gold Standard (2007-08-21 00:00:00)

## 2022-09-10 NOTE — Progress Notes (Signed)
Office Visit Note  Patient: Rhonda Marshall             Date of Birth: Feb 16, 1951           MRN: [id number]             PCP: Timoteo Gaul, FNP Referring: Timoteo Gaul, FNP Visit Date: 09/10/2022   Subjective:  Follow-up   History of Present Illness: Rhonda Marshall is a 72 y.o. female here for follow up for seropositive RA with joint pain in bilateral shoulders hands bothering her currently. Left shoulder is slightly worse than right. Her right hand and wrist is very bad first thing in the morning with difficulty gripping but better later in the day. Knee pain is doing well. Lab tests at initial visit with highly positive RF and CCP but sed rate and CRP looked okay. 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 Cervical spine with multilevel degenerative changes and anterior calcifications present.    Review of Systems  Constitutional:  Negative for fatigue.  HENT:  Negative for mouth sores and mouth dryness.   Eyes:  Negative for dryness.  Respiratory:  Negative for shortness of breath.   Cardiovascular:  Negative for chest pain and palpitations.  Gastrointestinal:  Negative for blood in stool, constipation and diarrhea.  Endocrine: Negative for increased urination.  Genitourinary:  Negative for involuntary urination.  Musculoskeletal:  Positive for joint pain, joint pain, joint swelling and morning stiffness. Negative for gait problem, myalgias, muscle weakness, muscle tenderness and myalgias.  Skin:  Negative for color change, rash, hair loss and sensitivity to sunlight.  Allergic/Immunologic: Negative for susceptible to infections.  Neurological:  Positive for headaches. Negative for dizziness.  Hematological:  Negative for swollen glands.  Psychiatric/Behavioral:  Negative for depressed mood and sleep disturbance. The patient is not nervous/anxious.     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   Leg swelling 05/05/2019   Hyperthyroidism 12/01/2016   Dyspnea on exertion 123XX123   Chronic systolic CHF (congestive heart failure) ([place]) 07/30/2015   Right knee pain 05/31/2015   Rheumatoid arthritis ([place]) 07/21/2014   Smoker 07/21/2014   Anticoagulation adequate 06/05/2014   Longstanding persistent atrial fibrillation ([place]) 11/14/2010   Reflux 11/14/2010    Past Medical History:  Diagnosis Date   Anxiety    Atrial fibrillation ([place])    newly diagnosed 10/2010   Family history of adverse reaction to anesthesia    Sister's BP drops    Hip fracture ([place]) 01/18/2022   Hypertension    Rheumatoid arthritis(714.0)    Subclinical hyperthyroidism     Family History  Problem Relation Age of Onset   Heart attack Mother 26       pacemaker age 25   Hypothyroidism Mother    Cancer Father        colon and prostate   Hypothyroidism Sister    Cancer Sister    Diabetes Brother    CAD Brother 43   Cancer Brother        Renal   Past Surgical History:  Procedure Laterality Date   Arm surgery  fracture, right arm   CARDIOVERSION N/A 08/10/2014   Procedure: CARDIOVERSION;  Surgeon: Dorothy Spark, MD;  Location: [place];  Service: Cardiovascular;  Laterality: N/A;   CARDIOVERSION N/A 01/22/2022   Procedure: CARDIOVERSION;  Surgeon: Freada Bergeron, MD;  Location: [facility] ENDOSCOPY;  Service: Cardiovascular;  Laterality: N/A;   CATARACT EXTRACTION     DIRECT LARYNGOSCOPY N/A 09/02/2019   Procedure: DIRECT LARYNGOSCOPY WITH BIOPSY;  Surgeon: Rozetta Nunnery, MD;  Location: [place];  Service: ENT;  Laterality: N/A;   HIP  ARTHROPLASTY Right 01/19/2022   Procedure: ARTHROPLASTY BIPOLAR HIP (HEMIARTHROPLASTY);  Surgeon: Marchia Bond, MD;  Location: [facility];  Service: Orthopedics;  Laterality: Right;   IR GASTROSTOMY TUBE MOD SED  10/17/2019   IR GASTROSTOMY TUBE REMOVAL  01/03/2020   TEE WITHOUT CARDIOVERSION N/A 01/22/2022   Procedure: TRANSESOPHAGEAL ECHOCARDIOGRAM (TEE);  Surgeon: Freada Bergeron, MD;  Location: [facility] ENDOSCOPY;  Service: Cardiovascular;  Laterality: N/A;   TUBAL LIGATION     Social History   Social History Narrative   Not on file   Immunization History  Administered Date(s) Administered   Influenza,inj,Quad PF,6+ Mos 07/01/2017     Objective: Vital Signs: BP (!) 145/87 (BP Location: Left Arm, Patient Position: Sitting, Cuff Size: Normal)   Pulse (!) 150   Resp 20   Ht '5\' 1"'$  (1.549 m)   Wt 124 lb (56.2 kg)   BMI 23.43 kg/m    Physical Exam Eyes:     Conjunctiva/sclera: Conjunctivae normal.  Cardiovascular:     Rate and Rhythm: Tachycardia present. Rhythm irregular.  Pulmonary:     Effort: Pulmonary effort is normal.     Breath sounds: Normal breath sounds.  Lymphadenopathy:     Cervical: No cervical adenopathy.  Skin:    General: Skin is warm and dry.     Findings: No rash.  Neurological:     Mental Status: She is alert.      Musculoskeletal Exam:  Neck full ROM no tenderness Shoulders some left side pain with full abduction but passive ROM intact, no swelling Elbows full ROM no tenderness or swelling Mild wrist ulnar subluxation nonreducible, right hand chronic appearing MCP joint widening, subluxation, and lateral deviation with reduced extension ROM especially in 4th-5th fingers Left 3rd MCP crepitus and tenderness Hip normal internal and external rotation without pain, no tenderness to lateral hip palpation Right knee crepitus and slightly reduced ROM, minimal tenderness to pressure, left knee normal Ankles full ROM no tenderness or swelling    CDAI  Exam: CDAI Score: 9  Patient Global: 30 mm; Provider Global: 20 mm Swollen: 0 ; Tender: 5  Joint Exam 09/10/2022      Right  Left  CMC   Tender     MCP 2   Tender     MCP 3   Tender   Tender  PIP 3      Tender     Investigation: No additional findings.  Imaging: XR Hand 2 View Right  Result Date: 09/10/2022 X-ray right hand 2 views Surgical hardware position in that radius.  Extensive cystic changes throughout carpal bones without definite visible erosions.  Severe MCP joint space loss with subluxation and ulnar deviation throughout digits most severe at second and third.  Possible erosive change versus very asymmetric joint space loss and deviation at the third PIP.  DIP joints are relatively preserved.  Bone mineralization consistent with generalized osteopenia. Impression Proximal joint changes consistent with longstanding erosive rheumatoid arthritis,  most severely affecting throughout MCP joints  XR Hand 2 View Left  Result Date: 09/10/2022 X-ray left hand 2 views Radiocarpal joint space narrowing and erosion of the ulnar styloid process.  Multiple cystic changes and probable erosion along the radial border of scaphoid.  Loss of scapholunate space probable slight subluxation and there is squaring of the first [facility] joint.  MCP joint space narrowing and subluxation most advanced at the third digit with probable remote marginal erosive change.  PIP and DIP joint spaces are relatively preserved.  Bone mineralization consistent with generalized osteopenia. Impression Longstanding erosive disease involving wrist and third MCP joint consistent with history of rheumatoid arthritis  XR Shoulder Left  Result Date: 09/10/2022 X-ray left shoulder 4 views There is mild glenohumeral joint space narrowing and marginal osteophyte formation.  Acromiohumeral distance appears normal.  AC joint with is normal but there is small periarticular calcification present superiorly.  No visible joint effusion.  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  Acromiohumeral distance is normal.  AC joint appears normal.  No visible joint effusion or abnormal calcifications. Impression No significant visible arthritis changes  XR Cervical Spine 2 or 3 views  Result Date: 09/10/2022 X-ray cervical spine 2 views AP and lateral No acute appearing bony abnormality.  There are multilevel degenerative changes with lateral osteophyte processes appear more advanced on the left side worst and worse between C3-C6.  Anterior endplate osteophytes visible at C5 and C6.  No significant loss of disc space height but there is possible slight anterolisthesis of C4 on C5.  Soft tissue calcifications present probably consistent with previous history of radiation therapy for esophageal cancer.   Recent Labs: Lab Results  Component Value Date   WBC 7.3 01/27/2022   HGB 12.8 01/27/2022   PLT 497 (H) 01/27/2022   NA 137 01/25/2022   K 3.5 01/25/2022   CL 105 01/25/2022   CO2 22 01/25/2022   GLUCOSE 90 01/25/2022   BUN 16 01/25/2022   CREATININE 0.91 01/25/2022   BILITOT 0.5 01/25/2022   ALKPHOS 85 01/25/2022   AST 16 01/25/2022   ALT 13 01/25/2022   PROT 6.3 (L) 01/25/2022   ALBUMIN 2.9 (L) 01/25/2022   CALCIUM 9.1 01/25/2022   GFRAA >60 10/20/2019    Speciality Comments: No specialty comments available.  Procedures:  No procedures performed Allergies: Clindamycin/lincomycin and Latex   Assessment / Plan:     Visit Diagnoses: Rheumatoid arthritis involving right shoulder with positive rheumatoid factor ([place]) - Plan: hydroxychloroquine (PLAQUENIL) 200 MG tablet  A lot of chronic damage with highly positive serology although there is not much peripheral joint synovitis appreciable on exam.  I do believe she would benefit with some type of  anti-inflammatory treatment.  Options are limited with abnormal baseline labs and her multiple cardiac medications and anticoagulation.  Recommend starting hydroxychloroquine 200 mg daily for least side effect and drug interaction profile.  Discussed risk of medication including cardiac related and need for retinal toxicity monitoring if continuing long-term.  Chronic pain of both shoulders  Mildly worse than usual current trending towards improvement remains worse in the left side.  This seems consistent with worse glenohumeral joint osteoarthritis visible on imaging.  Discussed option for intra-articular steroid injection but not today due to uncontrolled tachycardia due to being off her rate control medication.  Not having any symptomatic complaints or  shortness of breath.  Orders: No orders of the defined types were placed in this encounter.  Meds ordered this encounter  Medications   hydroxychloroquine (PLAQUENIL) 200 MG tablet    Sig: Take 1 tablet (200 mg total) by mouth daily.    Dispense:  30 tablet    Refill:  2     Follow-Up Instructions: Return in about 3 months (around 12/09/2022) for RA HCQ start f/u 21mo.   CCollier Salina MD  Note - This record has been created using [employer]  Chart creation errors have been sought, but may not always  have been located. Such creation errors do not reflect on  the standard of medical care.

## 2022-10-08 ENCOUNTER — Other Ambulatory Visit: Payer: 59

## 2022-10-08 ENCOUNTER — Other Ambulatory Visit: Payer: Self-pay | Admitting: Endocrinology

## 2022-10-08 DIAGNOSIS — E0520 Thyrotoxicosis with toxic multinodular goiter without thyrotoxic crisis or storm: Secondary | ICD-10-CM

## 2022-10-09 ENCOUNTER — Other Ambulatory Visit (INDEPENDENT_AMBULATORY_CARE_PROVIDER_SITE_OTHER): Payer: 59

## 2022-10-09 ENCOUNTER — Other Ambulatory Visit: Payer: Self-pay | Admitting: Cardiology

## 2022-10-09 DIAGNOSIS — E0520 Thyrotoxicosis with toxic multinodular goiter without thyrotoxic crisis or storm: Secondary | ICD-10-CM | POA: Diagnosis not present

## 2022-10-09 NOTE — Addendum Note (Signed)
Addended by: Adah Salvage F on: 10/09/2022 12:19 PM   Modules accepted: Orders

## 2022-10-09 NOTE — Addendum Note (Signed)
Addended by: Adah Salvage F on: 10/09/2022 12:20 PM   Modules accepted: Orders

## 2022-10-10 LAB — T3, FREE: T3, Free: 3.6 pg/mL (ref 2.0–4.4)

## 2022-10-10 LAB — CBC
Hematocrit: 41.6 % (ref 34.0–46.6)
Hemoglobin: 14 g/dL (ref 11.1–15.9)
MCH: 32.9 pg (ref 26.6–33.0)
MCHC: 33.7 g/dL (ref 31.5–35.7)
MCV: 98 fL — ABNORMAL HIGH (ref 79–97)
Platelets: 283 10*3/uL (ref 150–450)
RBC: 4.26 x10E6/uL (ref 3.77–5.28)
RDW: 13 % (ref 11.7–15.4)
WBC: 11.4 10*3/uL — ABNORMAL HIGH (ref 3.4–10.8)

## 2022-10-10 LAB — TSH: TSH: 0.303 u[IU]/mL — ABNORMAL LOW (ref 0.450–4.500)

## 2022-10-10 LAB — T4, FREE: Free T4: 1.3 ng/dL (ref 0.82–1.77)

## 2022-10-13 ENCOUNTER — Encounter: Payer: Self-pay | Admitting: Endocrinology

## 2022-10-13 ENCOUNTER — Ambulatory Visit (INDEPENDENT_AMBULATORY_CARE_PROVIDER_SITE_OTHER): Payer: 59 | Admitting: Endocrinology

## 2022-10-13 VITALS — BP 118/70 | HR 80 | Ht 61.5 in | Wt 125.0 lb

## 2022-10-13 DIAGNOSIS — E041 Nontoxic single thyroid nodule: Secondary | ICD-10-CM | POA: Diagnosis not present

## 2022-10-13 DIAGNOSIS — E0520 Thyrotoxicosis with toxic multinodular goiter without thyrotoxic crisis or storm: Secondary | ICD-10-CM

## 2022-10-13 MED ORDER — METHIMAZOLE 5 MG PO TABS: 5.0000 mg | ORAL_TABLET | Freq: Every day | ORAL | 2 refills | Status: DC

## 2022-10-13 NOTE — Progress Notes (Unsigned)
patient ID: Rhonda Marshall, female   DOB: February 16, 1951, 72 y.o.   MRN: [id number]                                                                                                                Reason for Appointment:  Hyperthyroidism, follow-up   Chief complaint: Follow-up for overactive thyroid   History of Present Illness:   Review of her records for thyroid History indicates that she reportedly had subclinical hyperthyroidism in 2012 when she was admitted for new onset atrial fibrillation Subsequently did not have any treatment or further evaluation She has been treated for atrial fibrillation by her cardiologist and was recommended evaluation for the thyroid recently  The patient is a poor historian and on her initial visit she did not give any symptoms of palpitations, shakiness, feeling excessively warm and sweaty or weight loss.  She did complain of nervousness and fatigue.   However she has also had issues with anxiety and depression  Since her baseline TSH was very low along with upper normal free T3 she has been on PTU 50 mg twice a day since October 2017 Baseline thyrotropin receptor antibody was negative With starting treatment she had less fatigue symptoms  She was scheduled for I-131 treatment because of the finding of a large hot nodule on the left side  However she has not been able to do this since she cannot be in isolation for a couple of days at home   RECENT history:   In 03/2017 when she was off her antithyroid drugs she was not having any symptoms like palpitations, however she was having some fatigue and shortness of breath and was having more problems with her tachycardia Her free T3 was up above normal at 4.3 at the same time  She has been treated with PTU 50 mg, the dose ranging from 1 up to 3 tablets a day  In 2/22 her dose was increased to 3 tablets daily when her free T3 was high With the increased dose she had less fatigue and overall felt  better However she has not been seen in follow-up for over a year  In the last few weeks she is feeling fairly good with no new symptoms of fatigability No significant weight change or change in appetite No increase in palpitations, is being followed for atrial fibrillation  Her thyroid levels are showing a trend for higher free T3 and T4 levels since last July and slightly low TSH with normal free T3 level along with normal TSH  Again she says she cannot do the recommended radioactive iodine treatment since she lives in a small house with several other people and currently not motivated to do this  Wt Readings from Last 3 Encounters:  10/13/22 125 lb (56.7 kg)  09/10/22 124 lb (56.2 kg)  08/20/22 119 lb (54 kg)      Thyroid function tests as follows:     Lab Results  Component Value Date   FREET4 1.30 10/09/2022   FREET4  1.02 01/18/2022   FREET4 0.93 01/03/2022   T3FREE 3.6 10/09/2022   T3FREE 2.1 01/03/2022   T3FREE 3.3 09/03/2021   TSH 0.303 (L) 10/09/2022   TSH 1.746 01/18/2022   TSH 0.477 01/03/2022    Lab Results  Component Value Date   THYROTRECAB <0.50 12/24/2016     Allergies as of 10/13/2022       Reactions   Clindamycin/lincomycin Rash   Latex Rash   Latex gloves        Medication List        Accurate as of October 13, 2022  3:38 PM. If you have any questions, ask your nurse or doctor.          acetaminophen 325 MG tablet Commonly known as: TYLENOL Take 1-2 tablets (325-650 mg total) by mouth every 6 (six) hours as needed for mild pain (pain score 1-3 or temp > 100.5).   ALPRAZolam 1 MG tablet Commonly known as: XANAX Take 1 mg by mouth 3 (three) times daily as needed.   amiodarone 200 MG tablet Commonly known as: PACERONE Take 200 mg by mouth daily.   apixaban 5 MG Tabs tablet Commonly known as: Eliquis Take 1 tablet (5 mg total) by mouth 2 (two) times daily.   diltiazem 240 MG 24 hr capsule Commonly known as: CARDIZEM CD Take by  mouth.   docusate sodium 100 MG capsule Commonly known as: COLACE Take 1 capsule (100 mg total) by mouth 2 (two) times daily.   empagliflozin 10 MG Tabs tablet Commonly known as: JARDIANCE Take 1 tablet (10 mg total) by mouth daily.   Entresto 24-26 MG Generic drug: sacubitril-valsartan Take 1 tablet by mouth 2 (two) times daily.   ferrous sulfate 325 (65 FE) MG tablet Take 1 tablet (325 mg total) by mouth 3 (three) times daily after meals.   hydroxychloroquine 200 MG tablet Commonly known as: PLAQUENIL Take 1 tablet (200 mg total) by mouth daily.   ibuprofen 800 MG tablet Commonly known as: ADVIL Take 800 mg by mouth.   metoprolol succinate 100 MG 24 hr tablet Commonly known as: TOPROL-XL Take 1 tablet (100 mg total) by mouth 2 (two) times daily. Take with or immediately following a meal.   multivitamin with minerals Tabs tablet Take 1 tablet by mouth daily.   oxyCODONE-acetaminophen 10-325 MG tablet Commonly known as: PERCOCET Take 1 tablet by mouth every 4 (four) hours as needed for pain.   polyethylene glycol 17 g packet Commonly known as: MIRALAX / GLYCOLAX Take 17 g by mouth daily as needed for mild constipation.   propylthiouracil 50 MG tablet Commonly known as: PTU TAKE ONE TABLET BY MOUTH THREE TIMES DAILY   spironolactone 25 MG tablet Commonly known as: ALDACTONE Take 25 mg by mouth daily.   Vitamin D (Ergocalciferol) 1.25 MG (50000 UNIT) Caps capsule Commonly known as: DRISDOL Take 50,000 Units by mouth once a week.            Past Medical History:  Diagnosis Date   Anxiety    Atrial fibrillation    newly diagnosed 10/2010   Family history of adverse reaction to anesthesia    Sister's BP drops    Hip fracture 01/18/2022   Hypertension    Rheumatoid arthritis(714.0)    Subclinical hyperthyroidism     Past Surgical History:  Procedure Laterality Date   Arm surgery     fracture, right arm   CARDIOVERSION N/A 08/10/2014   Procedure:  CARDIOVERSION;  Surgeon: Dorothy Spark,  MD;  Location: [place];  Service: Cardiovascular;  Laterality: N/A;   CARDIOVERSION N/A 01/22/2022   Procedure: CARDIOVERSION;  Surgeon: Freada Bergeron, MD;  Location[facility] ENDOSCOPY;  Service: Cardiovascular;  Laterality: N/A;   CATARACT EXTRACTION     DIRECT LARYNGOSCOPY N/A 09/02/2019   Procedure: DIRECT LARYNGOSCOPY WITH BIOPSY;  Surgeon: Rozetta Nunnery, MD;  Location: [place];  Service: ENT;  Laterality: N/A;   HIP ARTHROPLASTY Right 01/19/2022   Procedure: ARTHROPLASTY BIPOLAR HIP (HEMIARTHROPLASTY);  Surgeon: Marchia Bond, MD;  Location: [facility];  Service: Orthopedics;  Laterality: Right;   IR GASTROSTOMY TUBE MOD SED  10/17/2019   IR GASTROSTOMY TUBE REMOVAL  01/03/2020   TEE WITHOUT CARDIOVERSION N/A 01/22/2022   Procedure: TRANSESOPHAGEAL ECHOCARDIOGRAM (TEE);  Surgeon: Freada Bergeron, MD;  Location: [facility] ENDOSCOPY;  Service: Cardiovascular;  Laterality: N/A;   TUBAL LIGATION      Family History  Problem Relation Age of Onset   Heart attack Mother 23       pacemaker age 78   Hypothyroidism Mother    Cancer Father        colon and prostate   Hypothyroidism Sister    Cancer Sister    Diabetes Brother    CAD Brother 44   Cancer Brother        Renal    Social History:  reports that she quit smoking about 3 years ago. Her smoking use included cigarettes. She started smoking about 57 years ago. She has a 13.50 pack-year smoking history. She has been exposed to tobacco smoke. She has never used smokeless tobacco. She reports that she does not currently use alcohol. She reports that she does not use drugs.  Allergies:  Allergies  Allergen Reactions   Clindamycin/Lincomycin Rash   Latex Rash    Latex gloves     Review of Systems  She has chronic atrial fibrillation followed by cardiologist  She is on metoprolol 200 mg twice daily for heart rate control followed by cardiologist   Examination:    BP 118/70   Pulse 80   Ht 5' 1.5" (1.562 m)   Wt 125 lb (56.7 kg)   SpO2 97%   BMI 23.24 kg/m   Thyroid is about 2 times normal on the right side, relatively softer and about 2-2.5 normal on the left, felt better laterally  No tremor present Biceps reflexes appear normal     Assessment/Plan:   Hyperthyroidism, mild and caused by hot nodule on the left side   Her symptoms with the hyperthyroidism at baseline had been nonspecific in the form of mild fatigue and exertional shortness of breath Also has history of persistent atrial fibrillation  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 when not controlled  Previously hyperthyroidism has been controlled with 150 mg of PTU daily Subjectively no symptoms of hyperthyroidism She is regular with her regimen but her TSH is lower than usual and free T4 is relatively higher along with T3 which is now 3.6   She is again refused to do I-131 treatment because she cannot be isolated at home for 48 hours  She will stop PTU and switch to methimazole 5 mg daily and follow-up in 2 months   Salvador Coupe 10/13/2022, 3:38 PM     Note: This office note was prepared with Dragon voice recognition system technology. Any transcriptional errors that result from this process are unintentional.

## 2022-10-28 ENCOUNTER — Other Ambulatory Visit: Payer: Self-pay | Admitting: Cardiology

## 2022-11-05 ENCOUNTER — Other Ambulatory Visit: Payer: Self-pay | Admitting: Internal Medicine

## 2022-11-05 DIAGNOSIS — M05711 Rheumatoid arthritis with rheumatoid factor of right shoulder without organ or systems involvement: Secondary | ICD-10-CM

## 2022-11-17 ENCOUNTER — Other Ambulatory Visit: Payer: Self-pay | Admitting: Endocrinology

## 2022-11-24 ENCOUNTER — Ambulatory Visit: Payer: 59 | Admitting: Cardiology

## 2022-11-26 ENCOUNTER — Other Ambulatory Visit (INDEPENDENT_AMBULATORY_CARE_PROVIDER_SITE_OTHER): Payer: 59

## 2022-11-26 DIAGNOSIS — E0520 Thyrotoxicosis with toxic multinodular goiter without thyrotoxic crisis or storm: Secondary | ICD-10-CM | POA: Diagnosis not present

## 2022-11-27 LAB — CBC
HCT: 40.7 % (ref 36.0–46.0)
Hemoglobin: 13.7 g/dL (ref 12.0–15.0)
MCHC: 33.8 g/dL (ref 30.0–36.0)
MCV: 96.9 fl (ref 78.0–100.0)
Platelets: 349 10*3/uL (ref 150.0–400.0)
RBC: 4.2 Mil/uL (ref 3.87–5.11)
RDW: 12.8 % (ref 11.5–15.5)
WBC: 11.4 10*3/uL — ABNORMAL HIGH (ref 4.0–10.5)

## 2022-11-27 LAB — T4, FREE: Free T4: 0.96 ng/dL (ref 0.60–1.60)

## 2022-11-27 LAB — T3, FREE: T3, Free: 3 pg/mL (ref 2.3–4.2)

## 2022-11-27 LAB — TSH: TSH: 0.31 u[IU]/mL — ABNORMAL LOW (ref 0.35–5.50)

## 2022-12-02 ENCOUNTER — Ambulatory Visit (INDEPENDENT_AMBULATORY_CARE_PROVIDER_SITE_OTHER): Payer: 59 | Admitting: Endocrinology

## 2022-12-02 ENCOUNTER — Other Ambulatory Visit: Payer: 59

## 2022-12-02 ENCOUNTER — Encounter: Payer: Self-pay | Admitting: Endocrinology

## 2022-12-02 VITALS — BP 120/80 | HR 76 | Ht 61.5 in | Wt 114.0 lb

## 2022-12-02 DIAGNOSIS — E042 Nontoxic multinodular goiter: Secondary | ICD-10-CM

## 2022-12-02 DIAGNOSIS — E041 Nontoxic single thyroid nodule: Secondary | ICD-10-CM

## 2022-12-02 NOTE — Progress Notes (Signed)
Patient ID: Rhonda Marshall, female   DOB: Jan 13, 1951, 72 y.o.   MRN: [id number]                                                                                                                Reason for Appointment:  Hyperthyroidism, follow-up   Chief complaint: Follow-up for overactive thyroid   History of Present Illness:   Review of her records for thyroid History indicates that she reportedly had subclinical hyperthyroidism in 2012 when she was admitted for new onset atrial fibrillation Subsequently did not have any treatment or further evaluation She has been treated for atrial fibrillation by her cardiologist and was recommended evaluation for the thyroid recently  The patient is a poor historian and on her initial visit she did not give any symptoms of palpitations, shakiness, feeling excessively warm and sweaty or weight loss.  She did complain of nervousness and fatigue.   However she has also had issues with anxiety and depression  Since her baseline TSH was very low along with upper normal free T3 she has been on PTU 50 mg twice a day since October 2017 Baseline thyrotropin receptor antibody was negative With starting treatment she had less fatigue symptoms  She was scheduled for I-131 treatment because of the finding of a large hot nodule on the left side  However she has not been able to do this since she cannot be in isolation for a couple of days at home   RECENT history:   In 03/2017 when she was off her antithyroid drugs she was not having any symptoms like palpitations, however she was having some fatigue and shortness of breath and was having more problems with her tachycardia Her free T3 was up above normal at 4.3 at the same time  She had been treated with PTU 50 mg, the dose ranging from 1 up to 3 tablets a day  On her last visit in 10/2022 although she was not complaining of any palpitations, shortness of breath, change in appetite or fatigue her free T4  was relatively higher along with free T3 TSH was slightly low at 0.3  She was then changed to METHIMAZOLE 5 mg daily which she is taking regularly With this she does not feel any different and again feels fairly good She thinks she has lost weight because of having multiple teeth extractions and using soft diet  Thyroid levels are back to normal  She reportedly cannot do the recommended radioactive iodine treatment for her hypothyroidism since she lives in a small house with several other people and also not motivated to do this  Wt Readings from Last 3 Encounters:  12/02/22 114 lb (51.7 kg)  10/13/22 125 lb (56.7 kg)  09/10/22 124 lb (56.2 kg)      Thyroid function tests as follows:     Lab Results  Component Value Date   FREET4 0.96 11/26/2022   FREET4 1.30 10/09/2022   FREET4 1.02 01/18/2022   T3FREE 3.0 11/26/2022   T3FREE 3.6 10/09/2022  T3FREE 2.1 01/03/2022   TSH 0.31 (L) 11/26/2022   TSH 0.303 (L) 10/09/2022   TSH 1.746 01/18/2022    Lab Results  Component Value Date   THYROTRECAB <0.50 12/24/2016     Allergies as of 12/02/2022       Reactions   Clindamycin/lincomycin Rash   Latex Rash   Latex gloves        Medication List        Accurate as of Dec 02, 2022 11:27 AM. If you have any questions, ask your nurse or doctor.          acetaminophen 325 MG tablet Commonly known as: TYLENOL Take 1-2 tablets (325-650 mg total) by mouth every 6 (six) hours as needed for mild pain (pain score 1-3 or temp > 100.5).   ALPRAZolam 1 MG tablet Commonly known as: XANAX Take 1 mg by mouth 3 (three) times daily as needed.   amiodarone 200 MG tablet Commonly known as: PACERONE Take 200 mg by mouth daily.   apixaban 5 MG Tabs tablet Commonly known as: Eliquis Take 1 tablet (5 mg total) by mouth 2 (two) times daily.   diltiazem 240 MG 24 hr capsule Commonly known as: CARDIZEM CD Take by mouth.   docusate sodium 100 MG capsule Commonly known as:  COLACE Take 1 capsule (100 mg total) by mouth 2 (two) times daily.   empagliflozin 10 MG Tabs tablet Commonly known as: JARDIANCE Take 1 tablet (10 mg total) by mouth daily.   Entresto 24-26 MG Generic drug: sacubitril-valsartan Take 1 tablet by mouth 2 (two) times daily.   ferrous sulfate 325 (65 FE) MG tablet Take 1 tablet (325 mg total) by mouth 3 (three) times daily after meals.   hydroxychloroquine 200 MG tablet Commonly known as: PLAQUENIL Take 1 tablet (200 mg total) by mouth daily.   ibuprofen 800 MG tablet Commonly known as: ADVIL Take 800 mg by mouth.   methimazole 5 MG tablet Commonly known as: TAPAZOLE Take 1 tablet (5 mg total) by mouth daily.   metoprolol succinate 100 MG 24 hr tablet Commonly known as: TOPROL-XL Take 1 tablet (100 mg total) by mouth 2 (two) times daily. Take with or immediately following a meal.   multivitamin with minerals Tabs tablet Take 1 tablet by mouth daily.   oxyCODONE-acetaminophen 10-325 MG tablet Commonly known as: PERCOCET Take 1 tablet by mouth every 4 (four) hours as needed for pain.   polyethylene glycol 17 g packet Commonly known as: MIRALAX / GLYCOLAX Take 17 g by mouth daily as needed for mild constipation.   propylthiouracil 50 MG tablet Commonly known as: PTU Take 50 mg by mouth 3 (three) times daily.   spironolactone 25 MG tablet Commonly known as: ALDACTONE Take 1 tablet (25 mg total) by mouth daily.   Vitamin D (Ergocalciferol) 1.25 MG (50000 UNIT) Caps capsule Commonly known as: DRISDOL Take 50,000 Units by mouth once a week.            Past Medical History:  Diagnosis Date   Anxiety    Atrial fibrillation (HCC)    newly diagnosed 10/2010   Family history of adverse reaction to anesthesia    Sister's BP drops    Hip fracture (HCC) 01/18/2022   Hypertension    Rheumatoid arthritis(714.0)    Subclinical hyperthyroidism     Past Surgical History:  Procedure Laterality Date   Arm surgery      fracture, right arm   CARDIOVERSION N/A 08/10/2014   Procedure:  CARDIOVERSION;  Surgeon: Lars Masson, MD;  Location: [facility] ENDOSCOPY;  Service: Cardiovascular;  Laterality: N/A;   CARDIOVERSION N/A 01/22/2022   Procedure: CARDIOVERSION;  Surgeon: Meriam Sprague, MD;  Location: [facility] ENDOSCOPY;  Service: Cardiovascular;  Laterality: N/A;   CATARACT EXTRACTION     DIRECT LARYNGOSCOPY N/A 09/02/2019   Procedure: DIRECT LARYNGOSCOPY WITH BIOPSY;  Surgeon: Drema Halon, MD;  Location: [facility];  Service: ENT;  Laterality: N/A;   HIP ARTHROPLASTY Right 01/19/2022   Procedure: ARTHROPLASTY BIPOLAR HIP (HEMIARTHROPLASTY);  Surgeon: Teryl Lucy, MD;  Location: [facility];  Service: Orthopedics;  Laterality: Right;   IR GASTROSTOMY TUBE MOD SED  10/17/2019   IR GASTROSTOMY TUBE REMOVAL  01/03/2020   TEE WITHOUT CARDIOVERSION N/A 01/22/2022   Procedure: TRANSESOPHAGEAL ECHOCARDIOGRAM (TEE);  Surgeon: Meriam Sprague, MD;  Location: [facility] ENDOSCOPY;  Service: Cardiovascular;  Laterality: N/A;   TUBAL LIGATION      Family History  Problem Relation Age of Onset   Heart attack Mother 37       pacemaker age 68   Hypothyroidism Mother    Cancer Father        colon and prostate   Hypothyroidism Sister    Cancer Sister    Diabetes Brother    CAD Brother 22   Cancer Brother        Renal    Social History:  reports that she quit smoking about 3 years ago. Her smoking use included cigarettes. She started smoking about 57 years ago. She has a 13.50 pack-year smoking history. She has been exposed to tobacco smoke. She has never used smokeless tobacco. She reports that she does not currently use alcohol. She reports that she does not use drugs.  Allergies:  Allergies  Allergen Reactions   Clindamycin/Lincomycin Rash   Latex Rash    Latex gloves     Review of Systems  She has chronic atrial fibrillation followed by cardiologist  She is on metoprolol 200 mg twice daily for  heart rate control followed by cardiologist   Examination:   BP 120/80   Pulse 76   Ht 5' 1.5" (1.562 m)   Wt 114 lb (51.7 kg)   SpO2 99%   BMI 21.19 kg/m    No tremor present Biceps reflexes show normal relaxation No peripheral edema    Assessment/Plan:   Hyperthyroidism, mild and caused by hot nodule on the left side Also has multinodular goiter   Her symptoms with the hyperthyroidism at baseline had been nonspecific in the form of mild fatigue and exertional shortness of breath Also has history of persistent atrial fibrillation  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 when not controlled  Previously hyperthyroidism has been controlled with 150 mg of PTU daily Now she is on methimazole 5 mg daily since 4/24 because of rising T4 and T3 levels  As before she is quite asymptomatic Her weight loss is unrelated to thyroid conditions  She has refused to do I-131 treatment because she cannot be isolated at home for 48 hours  She will continue methimazole 5 mg daily and follow-up in 3 months   Shonette Rhames 12/02/2022, 11:27 AM     Note: This office note was prepared with Dragon voice recognition system technology. Any transcriptional errors that result from this process are unintentional.

## 2022-12-09 ENCOUNTER — Ambulatory Visit: Payer: 59 | Attending: Internal Medicine | Admitting: Internal Medicine

## 2022-12-09 ENCOUNTER — Encounter: Payer: Self-pay | Admitting: Internal Medicine

## 2022-12-09 ENCOUNTER — Other Ambulatory Visit: Payer: Self-pay | Admitting: Endocrinology

## 2022-12-09 VITALS — BP 120/69 | HR 94 | Resp 16 | Ht 61.0 in | Wt 116.0 lb

## 2022-12-09 DIAGNOSIS — G8929 Other chronic pain: Secondary | ICD-10-CM

## 2022-12-09 DIAGNOSIS — M05711 Rheumatoid arthritis with rheumatoid factor of right shoulder without organ or systems involvement: Secondary | ICD-10-CM

## 2022-12-09 DIAGNOSIS — M19011 Primary osteoarthritis, right shoulder: Secondary | ICD-10-CM

## 2022-12-09 DIAGNOSIS — M25561 Pain in right knee: Secondary | ICD-10-CM

## 2022-12-09 MED ORDER — HYDROXYCHLOROQUINE SULFATE 200 MG PO TABS
200.0000 mg | ORAL_TABLET | Freq: Every day | ORAL | 1 refills | Status: DC
Start: 2022-12-09 — End: 2023-01-07

## 2022-12-09 NOTE — Progress Notes (Signed)
Office Visit Note  Patient: Rhonda Marshall             Date of Birth: Sep 19, 1950           MRN: [id number]             PCP: Elisabeth Most, FNP Referring: Elisabeth Most, FNP Visit Date: 12/09/2022   Subjective:  Follow-up   History of Present Illness: Rhonda Marshall is a 72 y.o. 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  She started hydroxychloroquine after last visit without any immediate problem.  She had an eye exam with new glasses prescription but no acute problems and no concerns with hydroxychloroquine retinal toxicity risk.  Right shoulder pain is bothering her particularly worse at nighttime but does not feel this is a lot different than what she usually feels.  Hand symptoms also about the same keeps the same amount of swelling and deviation and knuckles on both sides.  Her right knee pain and swelling did improve since she started the Plaquenil.   Previous HPI 09/10/22 Rhonda Marshall is a 72 y.o. female here for follow up for seropositive RA with joint pain in bilateral shoulders hands bothering her currently. Left shoulder is slightly worse than right. Her right hand and wrist is very bad first thing in the morning with difficulty gripping but better later in the day. Knee pain is doing well. Lab tests at initial visit with highly positive RF and CCP but sed rate and CRP looked okay. 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 Cervical spine with multilevel degenerative changes and anterior calcifications present.  08/20/22 Rhonda Marshall is a 72 y.o. female here for evaluation and management of rheumatoid arthritis. She has never seen rheumatology before and has been treated by her PCP Dr. Clarene Duke with intermittent low dose oral steroids for joint inflammation. She has had pain and stiffness in her neck and shoulders and hands going back to her 30s. Episodically experienced  swelling as well and progressive deformity in her right hand. She usually experiences almost complete symptom improvement in 1 day after starting prednisone. She also sees orthopedic surgery and pain management for long term arthritis in multiple areas including her back. She had right hip femur fracture after a fall at the beach tripping on her sandal. Her worst problem lately has bene in the right knee with pain and episodic swelling. She had intraarticular steroid injection a few months ago with great improvement in knee pain.  Otherwise using as needed high-dose ibuprofen but only takes this occasionally. Medical history also significant for esophageal cancer.  She has significant cardiovascular disease history with congestive heart failure previously attempted direct-current cardioversion for A-fib that was not successful so on long-term eliquis.    Review of Systems  Constitutional:  Negative for fatigue.  HENT:  Negative for mouth sores and mouth dryness.   Eyes:  Negative for dryness.  Respiratory:  Positive for shortness of breath.   Cardiovascular:  Negative for chest pain and palpitations.  Gastrointestinal:  Negative for blood in stool, constipation and diarrhea.  Endocrine: Negative for increased urination.  Genitourinary:  Negative for involuntary urination.  Musculoskeletal:  Positive for joint pain, gait problem, joint pain, joint swelling and morning stiffness. Negative for myalgias, muscle weakness, muscle tenderness and myalgias.  Skin:  Negative for color change, rash, hair loss and sensitivity to sunlight.  Allergic/Immunologic: Negative for susceptible to infections.  Neurological:  Negative  for dizziness and headaches.  Hematological:  Negative for swollen glands.  Psychiatric/Behavioral:  Positive for depressed mood. Negative for sleep disturbance. The patient is not nervous/anxious.     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   Leg swelling 05/05/2019   Hyperthyroidism 12/01/2016   Dyspnea on exertion 07/30/2015   Chronic systolic CHF (congestive heart failure) (HCC) 07/30/2015   Right knee pain 05/31/2015   Rheumatoid arthritis (HCC) 07/21/2014   Smoker 07/21/2014   Anticoagulation adequate 06/05/2014   Longstanding persistent atrial fibrillation (HCC) 11/14/2010   Reflux 11/14/2010    Past Medical History:  Diagnosis Date   Anxiety    Atrial fibrillation (HCC)    newly diagnosed 10/2010   Family history of adverse reaction to anesthesia    Sister's BP drops    Hip fracture (HCC) 01/18/2022   Hypertension    Rheumatoid arthritis(714.0)    Subclinical hyperthyroidism     Family History  Problem Relation Age of Onset   Heart attack Mother 108       pacemaker age 98   Hypothyroidism Mother    Cancer Father        colon and prostate   Hypothyroidism Sister    Cancer Sister    Diabetes Brother    CAD Brother 30   Cancer Brother        Renal   Past Surgical History:  Procedure Laterality Date   Arm surgery     fracture, right arm   CARDIOVERSION N/A 08/10/2014   Procedure: CARDIOVERSION;  Surgeon: Lars Masson, MD;  Location: [facility] ENDOSCOPY;  Service: Cardiovascular;  Laterality: N/A;   CARDIOVERSION N/A 01/22/2022   Procedure: CARDIOVERSION;  Surgeon: Meriam Sprague, MD;  Location: [facility] ENDOSCOPY;  Service: Cardiovascular;  Laterality: N/A;   CATARACT EXTRACTION     DIRECT LARYNGOSCOPY N/A 09/02/2019   Procedure: DIRECT LARYNGOSCOPY WITH BIOPSY;  Surgeon: Drema Halon, MD;  Location: [facility];  Service: ENT;   Laterality: N/A;   HIP ARTHROPLASTY Right 01/19/2022   Procedure: ARTHROPLASTY BIPOLAR HIP (HEMIARTHROPLASTY);  Surgeon: Teryl Lucy, MD;  Location: [facility];  Service: Orthopedics;  Laterality: Right;   IR GASTROSTOMY TUBE MOD SED  10/17/2019   IR GASTROSTOMY TUBE REMOVAL  01/03/2020   TEE WITHOUT CARDIOVERSION N/A 01/22/2022   Procedure: TRANSESOPHAGEAL ECHOCARDIOGRAM (TEE);  Surgeon: Meriam Sprague, MD;  Location: [facility] ENDOSCOPY;  Service: Cardiovascular;  Laterality: N/A;   TUBAL LIGATION     Social History   Social History Narrative   Not on file   Immunization History  Administered Date(s) Administered   Influenza,inj,Quad PF,6+ Mos 07/01/2017     Objective: Vital Signs: BP 120/69 (BP Location: Left Arm, Patient Position: Sitting, Cuff Size: Normal)   Pulse 94   Resp 16   Ht 5\' 1"  (1.549 m)   Wt 116 lb (52.6 kg)   BMI 21.92 kg/m    Physical Exam Eyes:     Conjunctiva/sclera: Conjunctivae normal.  Cardiovascular:     Rate and Rhythm: Normal rate and regular rhythm.  Pulmonary:     Effort: Pulmonary effort is  normal.     Breath sounds: Normal breath sounds.  Lymphadenopathy:     Cervical: No cervical adenopathy.  Skin:    General: Skin is warm and dry.     Findings: No rash.  Neurological:     Mental Status: She is alert.  Psychiatric:        Mood and Affect: Mood normal.      Musculoskeletal Exam:  Shoulders range of motion intact some pain with overhead abduction, no palpable swelling Elbows full ROM no tenderness or swelling Wrists with slight right-sided ulnar subluxation and no palpable swelling Right hand with chronic appearing MCP joint widening subluxation and lateral deviation incompletely reducible, also decreased flexion range of motion especially in the fourth and fifth fingers, left hand third digit with MCP and PIP joint swelling and decreased flexion range of motion not tender to pressure but painful with limit of flexion movement Knees full ROM  no tenderness or swelling, right knee crepitus Ankles full ROM no tenderness or swelling   CDAI Exam: CDAI Score: 10  Patient Global: 20 mm; Provider Global: 30 mm Swollen: 0 ; Tender: 5  Joint Exam 12/09/2022      Right  Left  MCP 1   Tender     MCP 2   Tender     MCP 3   Tender   Tender  PIP 3      Tender     Investigation: No additional findings.  Imaging: No results found.  Recent Labs: Lab Results  Component Value Date   WBC 11.4 (H) 11/26/2022   HGB 13.7 11/26/2022   PLT 349.0 11/26/2022   NA 128 (L) 12/09/2022   K 5.0 12/09/2022   CL 94 (L) 12/09/2022   CO2 25 12/09/2022   GLUCOSE 91 12/09/2022   BUN 17 12/09/2022   CREATININE 1.02 (H) 12/09/2022   BILITOT 0.5 01/25/2022   ALKPHOS 85 01/25/2022   AST 16 01/25/2022   ALT 13 01/25/2022   PROT 6.3 (L) 01/25/2022   ALBUMIN 2.9 (L) 01/25/2022   CALCIUM 10.3 12/09/2022   GFRAA >60 10/20/2019    Speciality Comments: PLQ Eye Exam: 10/21/2022 WNL  [facility] f/u 2 years  Procedures:  No procedures performed Allergies: Clindamycin/lincomycin and Latex   Assessment / Plan:     Visit Diagnoses: Rheumatoid arthritis involving right shoulder with positive rheumatoid factor (HCC) - Plan: Sedimentation rate, C-reactive protein, BASIC METABOLIC PANEL WITH GFR, hydroxychloroquine (PLAQUENIL) 200 MG tablet  Symptoms seem partially improved as lots of chronic joint thickening so not sure if there is any active synovitis by exam today.  Will recheck sed rate and CRP were previously good and metabolic panel for monitoring Plaquenil.  She had interval blood count that was fine.  If no problems can continue hydroxychloroquine 200 mg daily versus needing additional treatment.  Chronic pain of right knee  Knee pain is improved since starting hydroxychloroquine still has some crepitus so probably mild osteoarthritis component as well though.  Arthritis of right shoulder region  Some persistent pain that is worse  at nighttime suspect some bursitis or tendinopathy component and range of motion remains very good.  Discussed possibility of intra-articular steroid injection for symptoms or adding at night muscle relaxer not interested in extra treatments today.  Orders: Orders Placed This Encounter  Procedures   Sedimentation rate   C-reactive protein   BASIC METABOLIC PANEL WITH GFR   Meds ordered this encounter  Medications   hydroxychloroquine (PLAQUENIL) 200 MG tablet  Sig: Take 1 tablet (200 mg total) by mouth daily.    Dispense:  90 tablet    Refill:  1   predniSONE (DELTASONE) 5 MG tablet    Sig: Take 4 tablets (20 mg total) by mouth daily with breakfast for 3 days, THEN 3 tablets (15 mg total) daily with breakfast for 3 days, THEN 2 tablets (10 mg total) daily with breakfast for 3 days, THEN 1 tablet (5 mg total) daily with breakfast for 3 days.    Dispense:  30 tablet    Refill:  0     Follow-Up Instructions: Return in about 6 months (around 06/11/2023) for RA on HCQ f/u 6mos.   Fuller Plan, MD  Note - This record has been created using [employer].  Chart creation errors have been sought, but may not always  have been located. Such creation errors do not reflect on  the standard of medical care.

## 2022-12-10 ENCOUNTER — Other Ambulatory Visit: Payer: Self-pay | Admitting: Cardiology

## 2022-12-10 LAB — BASIC METABOLIC PANEL WITH GFR
BUN/Creatinine Ratio: 17 (calc) (ref 6–22)
BUN: 17 mg/dL (ref 7–25)
CO2: 25 mmol/L (ref 20–32)
Calcium: 10.3 mg/dL (ref 8.6–10.4)
Chloride: 94 mmol/L — ABNORMAL LOW (ref 98–110)
Creat: 1.02 mg/dL — ABNORMAL HIGH (ref 0.60–1.00)
Glucose, Bld: 91 mg/dL (ref 65–99)
Potassium: 5 mmol/L (ref 3.5–5.3)
Sodium: 128 mmol/L — ABNORMAL LOW (ref 135–146)
eGFR: 58 mL/min/{1.73_m2} — ABNORMAL LOW (ref >=60)

## 2022-12-10 LAB — C-REACTIVE PROTEIN: CRP: 39.7 mg/L — ABNORMAL HIGH (ref <8.0)

## 2022-12-10 LAB — SEDIMENTATION RATE: Sed Rate: 33 mm/h — ABNORMAL HIGH (ref 0–30)

## 2022-12-10 MED ORDER — PREDNISONE 5 MG PO TABS
ORAL_TABLET | ORAL | 0 refills | Status: AC
Start: 2022-12-10 — End: 2022-12-21

## 2022-12-10 NOTE — Progress Notes (Signed)
Lab results show increase in inflammatory markers sed rate up to 33 and CRP up to 39.7.  Her metabolic panel looks okay she has mildly low electrolytes could be associated with mild dehydration. Based on these results we might need to consider increasing her treatment to more than just the current hydroxychloroquine. For now I recommend a temporary course of oral prednisone I will send Rx today. We should also change our scheduled follow-up to 3 months to reassess maybe adding different medication.

## 2022-12-17 ENCOUNTER — Other Ambulatory Visit: Payer: Self-pay | Admitting: Cardiology

## 2022-12-17 MED ORDER — EMPAGLIFLOZIN 10 MG PO TABS: 10.0000 mg | ORAL_TABLET | Freq: Every day | ORAL | 1 refills | Status: DC

## 2022-12-22 ENCOUNTER — Other Ambulatory Visit: Payer: Self-pay | Admitting: Cardiology

## 2022-12-22 DIAGNOSIS — I4811 Longstanding persistent atrial fibrillation: Secondary | ICD-10-CM

## 2022-12-22 NOTE — Telephone Encounter (Signed)
Eliquis 5mg  refill request received. Patient is 72 years old, weight-52.6kg, Crea-1.02 on 12/09/22, Diagnosis-Afib, and last seen by Dr. Antoine Poche on 05/26/22. Dose is appropriate based on dosing criteria. Will send in refill to requested pharmacy.

## 2023-01-05 NOTE — Progress Notes (Unsigned)
  Cardiology Office Note:   Date:  01/07/2023  ID:  Rhonda Marshall, DOB 1950-09-29, MRN [id number] PCP: Knox Royalty, MD  [facility] Providers Cardiologist:  Rollene Rotunda, MD {  History of Present Illness:   Rhonda Marshall is a 72 y.o. female with a hx of persistent atrial fibrillation, chronic anticoagulation, LBBB, hypertension, chronic systolic heart failure, and RA.  She underwent cardioversion January 2016.  She was seen during hospitalization in July after a fall for preoperative risk evaluation prior to hip surgery.  Hospital course was complicated with A-fib RVR and she subsequently underwent DCCV on amiodarone but unfortunately returned to atrial fibrillation..  Echocardiogram 01/19/2022 with LVEF 30-35%, mildly reduced RV function, mild MR, and mild left atrial enlargement.  Rate control was titrated and she was discharged on 100 mg Toprol, and p.o. amiodarone taper.  GDMT titrated for acute reduction in her EF.    He presents today.  She is not having any new acute complaints.  He does her groceries.  She walks dog.  The patient denies any new symptoms such as chest discomfort, neck or arm discomfort. There has been no new shortness of breath, PND or orthopnea. There have been no reported palpitations, presyncope or syncope.   ROS: As stated in the HPI and negative for all other systems.  Studies Reviewed:    EKG:   EKG Interpretation  Date/Time:  Wednesday January 07 2023 15:37:21 EDT Ventricular Rate:  84 PR Interval:    QRS Duration: 118 QT Interval:  384 QTC Calculation: 453 R Axis:   56 Text Interpretation: Atrial fibrillation Minimal voltage criteria for LVH, may be normal variant Anteroseptal infarct , age undetermined When compared with ECG of 22-Jan-2022 14:18, Atrial fibrillation has replaced Sinus rhythm Confirmed by Rollene Rotunda ([id number]) on 01/07/2023 4:09:59 PM    Risk Assessment/Calculations:              Physical Exam:   VS:  BP 98/68 (BP  Location: Right Arm, Patient Position: Sitting, Cuff Size: Normal)   Pulse 84   Ht 5\' 1"  (1.549 m)   Wt 116 lb 12.8 oz (53 kg)   SpO2 95%   BMI 22.07 kg/m    Wt Readings from Last 3 Encounters:  01/07/23 116 lb 12.8 oz (53 kg)  12/09/22 116 lb (52.6 kg)  12/02/22 114 lb (51.7 kg)     GEN: Well nourished, well developed in no acute distress NECK: No JVD; No carotid bruits CARDIAC:  Irregular RR, no murmurs, rubs, gallops RESPIRATORY:  Clear to auscultation without rales, wheezing or rhonchi  ABDOMEN: Soft, non-tender, non-distended EXTREMITIES:  No edema; No deformity   ASSESSMENT AND PLAN:     Chronic systolic heart failure: Her blood pressure runs low.  The meds as listed elsewhere in the records are those that she tolerates.  She will not tolerate med titration.  No change in therapy.  I will check an echocardiogram in July.  She had a negative perfusion study in the past.  This is thought to be non ischemic.     Persistent atrial fibrillation: She is on chronic anticoagulation.  She does not notice her atrial fibrillation.  Her rate is controlled.  No change in therapy.        Follow up me in six months.   Signed, Rollene Rotunda, MD

## 2023-01-07 ENCOUNTER — Ambulatory Visit: Payer: 59 | Attending: Cardiology | Admitting: Cardiology

## 2023-01-07 ENCOUNTER — Encounter: Payer: Self-pay | Admitting: Cardiology

## 2023-01-07 VITALS — BP 98/68 | HR 84 | Ht 61.0 in | Wt 116.8 lb

## 2023-01-07 DIAGNOSIS — I4819 Other persistent atrial fibrillation: Secondary | ICD-10-CM | POA: Diagnosis not present

## 2023-01-07 DIAGNOSIS — I5022 Chronic systolic (congestive) heart failure: Secondary | ICD-10-CM

## 2023-01-07 NOTE — Patient Instructions (Signed)
Medication Instructions:  Your physician recommends that you continue on your current medications as directed. Please refer to the Current Medication list given to you today.  *If you need a refill on your cardiac medications before your next appointment, please call your pharmacy*   Testing/Procedures: Your physician has requested that you have an echocardiogram. Echocardiography is a painless test that uses sound waves to create images of your heart. It provides your doctor with information about the size and shape of your heart and how well your heart's chambers and valves are working. This procedure takes approximately one hour. There are no restrictions for this procedure. Please do NOT wear cologne, perfume, aftershave, or lotions (deodorant is allowed). Please arrive 15 minutes prior to your appointment time. This will take place at [address]    Follow-Up: At [facility], you and your health needs are our priority.  As part of our continuing mission to provide you with exceptional heart care, we have created designated Provider Care Teams.  These Care Teams include your primary Cardiologist (physician) and Advanced Practice Providers (APPs -  Physician Assistants and Nurse Practitioners) who all work together to provide you with the care you need, when you need it.  We recommend signing up for the patient portal called "MyChart".  Sign up information is provided on this After Visit Summary.  MyChart is used to connect with patients for Virtual Visits (Telemedicine).  Patients are able to view lab/test results, encounter notes, upcoming appointments, etc.  Non-urgent messages can be sent to your provider as well.   To learn more about what you can do with MyChart, go to [email or web address].    Your next appointment:   6 month(s)  Provider:   James Hochrein, MD    

## 2023-01-13 ENCOUNTER — Telehealth: Payer: Self-pay | Admitting: Internal Medicine

## 2023-01-13 NOTE — Telephone Encounter (Signed)
Patient called to schedule 3 month follow-up appointment on 03/17/23 with Dr. Dimple Casey.  Patient requested a prescription refill of Prednisone sent to [facility] in [place].

## 2023-01-13 NOTE — Telephone Encounter (Signed)
Attempted to contact the patient and left message for patient to call the office.  

## 2023-01-14 NOTE — Telephone Encounter (Signed)
Contacted the patient to advise she was originally prescribed a prednisone taper and that we usually do not refill tapers. Patient verbalized understanding. Patient states the prednisone did help with her swelling and shoulder pain. Patient states she does not have as much pain anymore. Advised patient I could message Dr. Dimple Casey and let him know if she was still in pain and had swelling. Patient states she believes she can wait until her appointment on 03/17/2023. Patient call back number is [phone]. Please advise.

## 2023-01-26 ENCOUNTER — Other Ambulatory Visit: Payer: Self-pay | Admitting: Endocrinology

## 2023-02-04 ENCOUNTER — Ambulatory Visit (HOSPITAL_COMMUNITY): Payer: 59 | Attending: Cardiovascular Disease

## 2023-02-04 DIAGNOSIS — I4819 Other persistent atrial fibrillation: Secondary | ICD-10-CM | POA: Insufficient documentation

## 2023-02-04 DIAGNOSIS — I5022 Chronic systolic (congestive) heart failure: Secondary | ICD-10-CM | POA: Diagnosis not present

## 2023-02-04 LAB — ECHOCARDIOGRAM COMPLETE
Area-P 1/2: 5.56 cm2
MV M vel: 4.81 m/s
MV Peak grad: 92.5 mmHg
Radius: 0.55 cm
S' Lateral: 3.4 cm

## 2023-02-09 ENCOUNTER — Encounter: Payer: Self-pay | Admitting: *Deleted

## 2023-02-09 ENCOUNTER — Other Ambulatory Visit: Payer: Self-pay | Admitting: Family Medicine

## 2023-02-09 DIAGNOSIS — Z1231 Encounter for screening mammogram for malignant neoplasm of breast: Secondary | ICD-10-CM

## 2023-02-11 ENCOUNTER — Other Ambulatory Visit: Payer: 59

## 2023-02-13 ENCOUNTER — Other Ambulatory Visit (INDEPENDENT_AMBULATORY_CARE_PROVIDER_SITE_OTHER): Payer: 59

## 2023-02-13 DIAGNOSIS — E042 Nontoxic multinodular goiter: Secondary | ICD-10-CM

## 2023-02-13 DIAGNOSIS — E041 Nontoxic single thyroid nodule: Secondary | ICD-10-CM

## 2023-02-13 LAB — CBC
HCT: 43.6 % (ref 36.0–46.0)
Hemoglobin: 14.8 g/dL (ref 12.0–15.0)
MCHC: 34 g/dL (ref 30.0–36.0)
MCV: 95.1 fl (ref 78.0–100.0)
Platelets: 316 10*3/uL (ref 150.0–400.0)
RBC: 4.58 Mil/uL (ref 3.87–5.11)
RDW: 14.6 % (ref 11.5–15.5)
WBC: 10.6 10*3/uL — ABNORMAL HIGH (ref 4.0–10.5)

## 2023-02-13 LAB — T4, FREE: Free T4: 0.8 ng/dL (ref 0.60–1.60)

## 2023-02-13 LAB — TSH: TSH: 0.97 u[IU]/mL (ref 0.35–5.50)

## 2023-02-13 LAB — T3, FREE: T3, Free: 3.9 pg/mL (ref 2.3–4.2)

## 2023-02-16 ENCOUNTER — Ambulatory Visit (INDEPENDENT_AMBULATORY_CARE_PROVIDER_SITE_OTHER): Payer: 59 | Admitting: Endocrinology

## 2023-02-16 ENCOUNTER — Encounter: Payer: Self-pay | Admitting: Endocrinology

## 2023-02-16 VITALS — BP 115/75 | HR 92 | Ht 61.0 in | Wt 115.0 lb

## 2023-02-16 DIAGNOSIS — E041 Nontoxic single thyroid nodule: Secondary | ICD-10-CM | POA: Diagnosis not present

## 2023-02-16 DIAGNOSIS — E042 Nontoxic multinodular goiter: Secondary | ICD-10-CM

## 2023-02-16 NOTE — Patient Instructions (Signed)
Continue 1 tablet daily of methimazole

## 2023-02-16 NOTE — Progress Notes (Unsigned)
Patient ID: Rhonda Marshall, female   DOB: 1950-07-25, 72 y.o.   MRN: [id number]                                                                                                                Reason for Appointment:  Hyperthyroidism, follow-up   Chief complaint: Follow-up for overactive thyroid   History of Present Illness:   Review of her records for thyroid History indicates that she reportedly had subclinical hyperthyroidism in 2012 when she was admitted for new onset atrial fibrillation Subsequently did not have any treatment or further evaluation She has been treated for atrial fibrillation by her cardiologist and was recommended evaluation for the thyroid recently  The patient is a poor historian and on her initial visit she did not give any symptoms of palpitations, shakiness, feeling excessively warm and sweaty or weight loss.  She did complain of nervousness and fatigue.   However she has also had issues with anxiety and depression  Since her baseline TSH was very low along with upper normal free T3 she has been on PTU 50 mg twice a day since October 2017 Baseline thyrotropin receptor antibody was negative With starting treatment she had less fatigue symptoms  She was scheduled for I-131 treatment because of the finding of a large hot nodule on the left side  However she has not been able to do this since she cannot be in isolation for a couple of days at home   RECENT history:   In 03/2017 when she was off her antithyroid drugs she was not having any symptoms like palpitations, however she was having some fatigue and shortness of breath and was having more problems with her tachycardia Her free T3 was up above normal at 4.3 at the same time  She had been treated with PTU 50 mg, the dose ranging from 1 up to 3 tablets a day  On her visit in 10/2022 although she was not complaining of any palpitations, shortness of breath, change in appetite or fatigue her free T4 was  relatively higher along with free T3 TSH was slightly low at 0.3  She was then changed to METHIMAZOLE 5 mg daily which she is taking daily every morning Again she feels fairly good with no weight loss, unusual fatigue  Thyroid levels are consistently normal now including TSH  She again cannot do the recommended radioactive iodine treatment for her hypothyroidism since she lives in a small house with several other people and also not motivated to do this  Wt Readings from Last 3 Encounters:  02/16/23 115 lb (52.2 kg)  01/07/23 116 lb 12.8 oz (53 kg)  12/09/22 116 lb (52.6 kg)      Thyroid function tests as follows:     Lab Results  Component Value Date   FREET4 0.80 02/13/2023   FREET4 0.96 11/26/2022   FREET4 1.30 10/09/2022   T3FREE 3.9 02/13/2023   T3FREE 3.0 11/26/2022   T3FREE 3.6 10/09/2022   TSH 0.97 02/13/2023   TSH  0.31 (L) 11/26/2022   TSH 0.303 (L) 10/09/2022    Lab Results  Component Value Date   THYROTRECAB <0.50 12/24/2016     Allergies as of 02/16/2023       Reactions   Clindamycin/lincomycin Rash   Latex Rash   Latex gloves        Medication List        Accurate as of February 16, 2023  3:41 PM. If you have any questions, ask your nurse or doctor.          acetaminophen 325 MG tablet Commonly known as: TYLENOL Take 1-2 tablets (325-650 mg total) by mouth every 6 (six) hours as needed for mild pain (pain score 1-3 or temp > 100.5).   ALPRAZolam 1 MG tablet Commonly known as: XANAX Take 1 mg by mouth 3 (three) times daily as needed.   Eliquis 5 MG Tabs tablet Generic drug: apixaban Take 1 tablet (5 mg total) by mouth 2 (two) times daily.   empagliflozin 10 MG Tabs tablet Commonly known as: JARDIANCE Take 1 tablet (10 mg total) by mouth daily.   Entresto 24-26 MG Generic drug: sacubitril-valsartan Take 1 tablet by mouth 2 (two) times daily.   metoprolol succinate 100 MG 24 hr tablet Commonly known as: TOPROL-XL Take 1 tablet (100  mg total) by mouth 2 (two) times daily. Take with or immediately following a meal.   multivitamin with minerals Tabs tablet Take 1 tablet by mouth daily.   oxyCODONE-acetaminophen 10-325 MG tablet Commonly known as: PERCOCET Take 1 tablet by mouth every 4 (four) hours as needed for pain.   Vitamin D (Ergocalciferol) 1.25 MG (50000 UNIT) Caps capsule Commonly known as: DRISDOL Take 50,000 Units by mouth once a week.            Past Medical History:  Diagnosis Date   Anxiety    Atrial fibrillation (HCC)    newly diagnosed 10/2010   Family history of adverse reaction to anesthesia    Sister's BP drops    Hip fracture (HCC) 01/18/2022   Hypertension    Rheumatoid arthritis(714.0)    Subclinical hyperthyroidism     Past Surgical History:  Procedure Laterality Date   Arm surgery     fracture, right arm   CARDIOVERSION N/A 08/10/2014   Procedure: CARDIOVERSION;  Surgeon: Lars Masson, MD;  Location: [facility] ENDOSCOPY;  Service: Cardiovascular;  Laterality: N/A;   CARDIOVERSION N/A 01/22/2022   Procedure: CARDIOVERSION;  Surgeon: Meriam Sprague, MD;  Location: [facility] ENDOSCOPY;  Service: Cardiovascular;  Laterality: N/A;   CATARACT EXTRACTION     DIRECT LARYNGOSCOPY N/A 09/02/2019   Procedure: DIRECT LARYNGOSCOPY WITH BIOPSY;  Surgeon: Drema Halon, MD;  Location: [facility];  Service: ENT;  Laterality: N/A;   HIP ARTHROPLASTY Right 01/19/2022   Procedure: ARTHROPLASTY BIPOLAR HIP (HEMIARTHROPLASTY);  Surgeon: Teryl Lucy, MD;  Location: [facility] ORS;  Service: Orthopedics;  Laterality: Right;   IR GASTROSTOMY TUBE MOD SED  10/17/2019   IR GASTROSTOMY TUBE REMOVAL  01/03/2020   TEE WITHOUT CARDIOVERSION N/A 01/22/2022   Procedure: TRANSESOPHAGEAL ECHOCARDIOGRAM (TEE);  Surgeon: Meriam Sprague, MD;  Location: [facility] ENDOSCOPY;  Service: Cardiovascular;  Laterality: N/A;   TUBAL LIGATION      Family History  Problem Relation Age of Onset   Heart attack Mother  32       pacemaker age 73   Hypothyroidism Mother    Cancer Father        colon and prostate  Hypothyroidism Sister    Cancer Sister    Diabetes Brother    CAD Brother 39   Cancer Brother        Renal    Social History:  reports that she quit smoking about 3 years ago. Her smoking use included cigarettes. She started smoking about 57 years ago. She has a 13.6 pack-year smoking history. She has been exposed to tobacco smoke. She has never used smokeless tobacco. She reports that she does not currently use alcohol. She reports that she does not use drugs.  Allergies:  Allergies  Allergen Reactions   Clindamycin/Lincomycin Rash   Latex Rash    Latex gloves     Review of Systems  She has chronic atrial fibrillation followed by cardiologist  She is on metoprolol 200 mg twice daily for heart rate control followed by cardiologist   Examination:   BP 115/75   Pulse 92   Ht 5\' 1"  (1.549 m)   Wt 115 lb (52.2 kg)   SpO2 98%   BMI 21.73 kg/m   Thyroid barely palpable on the right side and about 2 cm firm smooth nodule felt on the left side on swallowing No tremor present Biceps reflexes show normal relaxation Heart rate about 90, somewhat irregular    Assessment/Plan:   Hyperthyroidism, mild and caused by hot nodule on the left side Also has multinodular goiter   Her symptoms with the hyperthyroidism at baseline had been nonspecific in the form of mild fatigue and exertional shortness of breath Also has history of persistent atrial fibrillation  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 when not controlled and minimal symptoms  Previously hyperthyroidism has been previously controlled with 150 mg of PTU daily Now she is on methimazole 5 mg daily since 4/24 because of rising T4 and T3 levels  Subjectively doing well again and weight has leveled  She has refused to do I-131 treatment because she cannot be isolated at home for 48 hours, this was discussed again today  She  will continue methimazole 5 mg daily      02/16/2023, 3:41 PM     Note: This office note was prepared with Dragon voice recognition system technology. Any transcriptional errors that result from this process are unintentional.

## 2023-02-20 ENCOUNTER — Other Ambulatory Visit: Payer: 59

## 2023-02-23 ENCOUNTER — Ambulatory Visit: Payer: 59 | Admitting: Endocrinology

## 2023-03-03 NOTE — Progress Notes (Deleted)
Office Visit Note  Patient: Rhonda Marshall             Date of Birth: 02-26-1951           MRN: [id number]             PCP: Knox Royalty, MD Referring: Knox Royalty, MD Visit Date: 03/17/2023   Subjective:  No chief complaint on file.   History of Present Illness: Rhonda Marshall is a 72 y.o. 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    Previous HPI 12/09/2022 Rhonda Marshall is a 72 y.o. 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  She started hydroxychloroquine after last visit without any immediate problem.  She had an eye exam with new glasses prescription but no acute problems and no concerns with hydroxychloroquine retinal toxicity risk.  Right shoulder pain is bothering her particularly worse at nighttime but does not feel this is a lot different than what she usually feels.  Hand symptoms also about the same keeps the same amount of swelling and deviation and knuckles on both sides.  Her right knee pain and swelling did improve since she started the Plaquenil.     Previous HPI 09/10/22 Rhonda Marshall is a 72 y.o. female here for follow up for seropositive RA with joint pain in bilateral shoulders hands bothering her currently. Left shoulder is slightly worse than right. Her right hand and wrist is very bad first thing in the morning with difficulty gripping but better later in the day. Knee pain is doing well. Lab tests at initial visit with highly positive RF and CCP but sed rate and CRP looked okay. 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 Cervical spine with multilevel degenerative changes and anterior calcifications present.   08/20/22 Rhonda Marshall is a 72 y.o. female here for evaluation and management of rheumatoid arthritis. She has never seen rheumatology before and has been treated by her PCP Dr. Clarene Duke with intermittent low dose  oral steroids for joint inflammation. She has had pain and stiffness in her neck and shoulders and hands going back to her 30s. Episodically experienced swelling as well and progressive deformity in her right hand. She usually experiences almost complete symptom improvement in 1 day after starting prednisone. She also sees orthopedic surgery and pain management for long term arthritis in multiple areas including her back. She had right hip femur fracture after a fall at the beach tripping on her sandal. Her worst problem lately has bene in the right knee with pain and episodic swelling. She had intraarticular steroid injection a few months ago with great improvement in knee pain.  Otherwise using as needed high-dose ibuprofen but only takes this occasionally. Medical history also significant for esophageal cancer.  She has significant cardiovascular disease history with congestive heart failure previously attempted direct-current cardioversion for A-fib that was not successful so on long-term eliquis.   No Rheumatology ROS completed.   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   History of Present Illness: Rhonda Marshall is a 72 y.o. 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    Previous HPI 12/09/2022 Rhonda Marshall is a 72 y.o. 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  She started hydroxychloroquine after last visit without any immediate problem.  She had an eye exam with new glasses prescription but no acute problems and no concerns with hydroxychloroquine retinal toxicity risk.  Right shoulder pain is bothering her particularly worse at nighttime but does not feel this is a lot different than what she usually feels.  Hand symptoms also about the same keeps the same amount of swelling and deviation and knuckles on both sides.  Her right knee pain and swelling did improve since she started the Plaquenil.     Previous HPI 09/10/22 Rhonda Marshall is a 72 y.o. female here for follow up for seropositive RA with joint pain in bilateral shoulders hands bothering her currently. Left shoulder is slightly worse than right. Her right hand and wrist is very bad first thing in the morning with difficulty gripping but better later in the day. Knee pain is doing well. Lab tests at initial visit with highly positive RF and CCP but sed rate and CRP looked okay. 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 Cervical spine with multilevel degenerative changes and anterior calcifications present.   08/20/22 Rhonda Marshall is a 72 y.o. female here for evaluation and management of rheumatoid arthritis. She has never seen rheumatology before and has been treated by her PCP Dr. Clarene Duke with intermittent low dose  oral steroids for joint inflammation. She has had pain and stiffness in her neck and shoulders and hands going back to her 30s. Episodically experienced swelling as well and progressive deformity in her right hand. She usually experiences almost complete symptom improvement in 1 day after starting prednisone. She also sees orthopedic surgery and pain management for long term arthritis in multiple areas including her back. She had right hip femur fracture after a fall at the beach tripping on her sandal. Her worst problem lately has bene in the right knee with pain and episodic swelling. She had intraarticular steroid injection a few months ago with great improvement in knee pain.  Otherwise using as needed high-dose ibuprofen but only takes this occasionally. Medical history also significant for esophageal cancer.  She has significant cardiovascular disease history with congestive heart failure previously attempted direct-current cardioversion for A-fib that was not successful so on long-term eliquis.   No Rheumatology ROS completed.   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   Leg swelling 05/05/2019   Hyperthyroidism 12/01/2016  Dyspnea on exertion 07/30/2015   Chronic systolic CHF (congestive heart failure) (HCC) 07/30/2015   Right knee pain 05/31/2015   Rheumatoid arthritis (HCC) 07/21/2014   Smoker 07/21/2014   Anticoagulation adequate 06/05/2014   Longstanding persistent atrial fibrillation (HCC) 11/14/2010   Reflux 11/14/2010    Past Medical History:  Diagnosis Date   Anxiety    Atrial  fibrillation (HCC)    newly diagnosed 10/2010   Family history of adverse reaction to anesthesia    Sister's BP drops    Hip fracture (HCC) 01/18/2022   Hypertension    Rheumatoid arthritis(714.0)    Subclinical hyperthyroidism     Family History  Problem Relation Age of Onset   Heart attack Mother 64       pacemaker age 72   Hypothyroidism Mother    Cancer Father        colon and prostate   Hypothyroidism Sister    Cancer Sister    Diabetes Brother    CAD Brother 60   Cancer Brother        Renal   Past Surgical History:  Procedure Laterality Date   Arm surgery     fracture, right arm   CARDIOVERSION N/A 08/10/2014   Procedure: CARDIOVERSION;  Surgeon: Lars Masson, MD;  Location: [facility] ENDOSCOPY;  Service: Cardiovascular;  Laterality: N/A;   CARDIOVERSION N/A 01/22/2022   Procedure: CARDIOVERSION;  Surgeon: Meriam Sprague, MD;  Location: [facility] ENDOSCOPY;  Service: Cardiovascular;  Laterality: N/A;   CATARACT EXTRACTION     DIRECT LARYNGOSCOPY N/A 09/02/2019   Procedure: DIRECT LARYNGOSCOPY WITH BIOPSY;  Surgeon: Drema Halon, MD;  Location: [facility];  Service: ENT;  Laterality: N/A;   HIP ARTHROPLASTY Right 01/19/2022   Procedure: ARTHROPLASTY BIPOLAR HIP (HEMIARTHROPLASTY);  Surgeon: Teryl Lucy, MD;  Location: [facility]S;  Service: Orthopedics;  Laterality: Right;   IR GASTROSTOMY TUBE MOD SED  10/17/2019   IR GASTROSTOMY TUBE REMOVAL  01/03/2020   TEE WITHOUT CARDIOVERSION N/A 01/22/2022   Procedure: TRANSESOPHAGEAL ECHOCARDIOGRAM (TEE);  Surgeon: Meriam Sprague, MD;  Location: [facility] ENDOSCOPY;  Service: Cardiovascular;  Laterality: N/A;   TUBAL LIGATION     Social History   Social History Narrative   Not on file   Immunization History  Administered Date(s) Administered   Influenza,inj,Quad PF,6+ Mos 07/01/2017     Objective: Vital Signs: There were no vitals taken for this visit.   Physical Exam   Musculoskeletal Exam: ***  CDAI  Exam: CDAI Score: -- Patient Global: --; Provider Global: -- Swollen: --; Tender: -- Joint Exam 03/17/2023   No joint exam has been documented for this visit   There is currently no information documented on the homunculus. Go to the Rheumatology activity and complete the homunculus joint exam.  Investigation: No additional findings.  Imaging: ECHOCARDIOGRAM COMPLETE  Result Date: 02/04/2023    ECHOCARDIOGRAM REPORT   Patient Name:   LINSY BALDASSARI Date of Exam: 02/04/2023 Medical Rec #:  [id number]        Height:       61.0 in Accession #:    [id number]       Weight:       116.8 lb Date of Birth:  12-Oct-1950        BSA:          1.503 m Patient Age:    72 years         BP:           109/88  Office Visit Note  Patient: Rhonda Marshall             Date of Birth: 02-26-1951           MRN: [id number]             PCP: Knox Royalty, MD Referring: Knox Royalty, MD Visit Date: 03/17/2023   Subjective:  No chief complaint on file.   History of Present Illness: Rhonda Marshall is a 72 y.o. 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    Previous HPI 12/09/2022 Rhonda Marshall is a 72 y.o. 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  She started hydroxychloroquine after last visit without any immediate problem.  She had an eye exam with new glasses prescription but no acute problems and no concerns with hydroxychloroquine retinal toxicity risk.  Right shoulder pain is bothering her particularly worse at nighttime but does not feel this is a lot different than what she usually feels.  Hand symptoms also about the same keeps the same amount of swelling and deviation and knuckles on both sides.  Her right knee pain and swelling did improve since she started the Plaquenil.     Previous HPI 09/10/22 Rhonda Marshall is a 72 y.o. female here for follow up for seropositive RA with joint pain in bilateral shoulders hands bothering her currently. Left shoulder is slightly worse than right. Her right hand and wrist is very bad first thing in the morning with difficulty gripping but better later in the day. Knee pain is doing well. Lab tests at initial visit with highly positive RF and CCP but sed rate and CRP looked okay. 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 Cervical spine with multilevel degenerative changes and anterior calcifications present.   08/20/22 Rhonda Marshall is a 72 y.o. female here for evaluation and management of rheumatoid arthritis. She has never seen rheumatology before and has been treated by her PCP Dr. Clarene Duke with intermittent low dose  oral steroids for joint inflammation. She has had pain and stiffness in her neck and shoulders and hands going back to her 30s. Episodically experienced swelling as well and progressive deformity in her right hand. She usually experiences almost complete symptom improvement in 1 day after starting prednisone. She also sees orthopedic surgery and pain management for long term arthritis in multiple areas including her back. She had right hip femur fracture after a fall at the beach tripping on her sandal. Her worst problem lately has bene in the right knee with pain and episodic swelling. She had intraarticular steroid injection a few months ago with great improvement in knee pain.  Otherwise using as needed high-dose ibuprofen but only takes this occasionally. Medical history also significant for esophageal cancer.  She has significant cardiovascular disease history with congestive heart failure previously attempted direct-current cardioversion for A-fib that was not successful so on long-term eliquis.   No Rheumatology ROS completed.   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   Leg swelling 05/05/2019   Hyperthyroidism 12/01/2016  Dyspnea on exertion 07/30/2015   Chronic systolic CHF (congestive heart failure) (HCC) 07/30/2015   Right knee pain 05/31/2015   Rheumatoid arthritis (HCC) 07/21/2014   Smoker 07/21/2014   Anticoagulation adequate 06/05/2014   Longstanding persistent atrial fibrillation (HCC) 11/14/2010   Reflux 11/14/2010    Past Medical History:  Diagnosis Date   Anxiety    Atrial  fibrillation (HCC)    newly diagnosed 10/2010   Family history of adverse reaction to anesthesia    Sister's BP drops    Hip fracture (HCC) 01/18/2022   Hypertension    Rheumatoid arthritis(714.0)    Subclinical hyperthyroidism     Family History  Problem Relation Age of Onset   Heart attack Mother 64       pacemaker age 72   Hypothyroidism Mother    Cancer Father        colon and prostate   Hypothyroidism Sister    Cancer Sister    Diabetes Brother    CAD Brother 60   Cancer Brother        Renal   Past Surgical History:  Procedure Laterality Date   Arm surgery     fracture, right arm   CARDIOVERSION N/A 08/10/2014   Procedure: CARDIOVERSION;  Surgeon: Lars Masson, MD;  Location: [facility] ENDOSCOPY;  Service: Cardiovascular;  Laterality: N/A;   CARDIOVERSION N/A 01/22/2022   Procedure: CARDIOVERSION;  Surgeon: Meriam Sprague, MD;  Location: [facility] ENDOSCOPY;  Service: Cardiovascular;  Laterality: N/A;   CATARACT EXTRACTION     DIRECT LARYNGOSCOPY N/A 09/02/2019   Procedure: DIRECT LARYNGOSCOPY WITH BIOPSY;  Surgeon: Drema Halon, MD;  Location: [facility];  Service: ENT;  Laterality: N/A;   HIP ARTHROPLASTY Right 01/19/2022   Procedure: ARTHROPLASTY BIPOLAR HIP (HEMIARTHROPLASTY);  Surgeon: Teryl Lucy, MD;  Location: [facility];  Service: Orthopedics;  Laterality: Right;   IR GASTROSTOMY TUBE MOD SED  10/17/2019   IR GASTROSTOMY TUBE REMOVAL  01/03/2020   TEE WITHOUT CARDIOVERSION N/A 01/22/2022   Procedure: TRANSESOPHAGEAL ECHOCARDIOGRAM (TEE);  Surgeon: Meriam Sprague, MD;  Location: [facility] ENDOSCOPY;  Service: Cardiovascular;  Laterality: N/A;   TUBAL LIGATION     Social History   Social History Narrative   Not on file   Immunization History  Administered Date(s) Administered   Influenza,inj,Quad PF,6+ Mos 07/01/2017     Objective: Vital Signs: There were no vitals taken for this visit.   Physical Exam   Musculoskeletal Exam: ***  CDAI  Exam: CDAI Score: -- Patient Global: --; Provider Global: -- Swollen: --; Tender: -- Joint Exam 03/17/2023   No joint exam has been documented for this visit   There is currently no information documented on the homunculus. Go to the Rheumatology activity and complete the homunculus joint exam.  Investigation: No additional findings.  Imaging: ECHOCARDIOGRAM COMPLETE  Result Date: 02/04/2023    ECHOCARDIOGRAM REPORT   Patient Name:   LINSY BALDASSARI Date of Exam: 02/04/2023 Medical Rec #:  [id number]        Height:       61.0 in Accession #:    [id number]       Weight:       116.8 lb Date of Birth:  12-Oct-1950        BSA:          1.503 m Patient Age:    72 years         BP:           109/88

## 2023-03-07 ENCOUNTER — Other Ambulatory Visit: Payer: Self-pay | Admitting: Internal Medicine

## 2023-03-07 DIAGNOSIS — M05711 Rheumatoid arthritis with rheumatoid factor of right shoulder without organ or systems involvement: Secondary | ICD-10-CM

## 2023-03-09 ENCOUNTER — Telehealth: Payer: Self-pay | Admitting: Internal Medicine

## 2023-03-09 NOTE — Telephone Encounter (Signed)
Attempted to contact the patient and left a message to call the office back. 

## 2023-03-09 NOTE — Telephone Encounter (Signed)
Pt called stating she had gotten a call and would like a call back.

## 2023-03-09 NOTE — Telephone Encounter (Signed)
Contacted the patient to inquire if she is still taking the hydroxychloroquine since it was discontinued. Patient states she had a bad flare and was taking prednisone and it helped her a lot. Patient states she had stopped the hydroxychloroquine when she was taking the prednisone. Patient states she is out of prednisone. Patient states she now takes the hydroxychloroquine as needed, maybe a few times a week. Patient states she really likes the prednisone and inquired if she could just be prescribed that. Inquired if the patient needs a refill of Hydroxychloroquine and patient states she does not because she just picked up a bottle Saturday. Please advise.

## 2023-03-09 NOTE — Telephone Encounter (Signed)
The plan was to continue hydroxychloroquine for now and if needed we might change this to a different long term medication. Prednisone is not ideal as a long term medicine due to side effects. We have a follow up in about a week so can take another look and discuss her options then too.

## 2023-03-10 ENCOUNTER — Inpatient Hospital Stay (HOSPITAL_BASED_OUTPATIENT_CLINIC_OR_DEPARTMENT_OTHER)
Admission: EM | Admit: 2023-03-10 | Discharge: 2023-03-15 | DRG: 871 | Disposition: A | Discharge status: Home / Self Care | Payer: 59 | Attending: Internal Medicine | Admitting: Internal Medicine

## 2023-03-10 ENCOUNTER — Inpatient Hospital Stay (HOSPITAL_COMMUNITY): Payer: 59

## 2023-03-10 ENCOUNTER — Emergency Department (HOSPITAL_BASED_OUTPATIENT_CLINIC_OR_DEPARTMENT_OTHER): Payer: 59

## 2023-03-10 ENCOUNTER — Other Ambulatory Visit: Payer: Self-pay

## 2023-03-10 ENCOUNTER — Encounter (HOSPITAL_BASED_OUTPATIENT_CLINIC_OR_DEPARTMENT_OTHER): Payer: Self-pay | Admitting: Emergency Medicine

## 2023-03-10 DIAGNOSIS — I110 Hypertensive heart disease with heart failure: Secondary | ICD-10-CM | POA: Diagnosis present

## 2023-03-10 DIAGNOSIS — K859 Acute pancreatitis without necrosis or infection, unspecified: Principal | ICD-10-CM

## 2023-03-10 DIAGNOSIS — N390 Urinary tract infection, site not specified: Secondary | ICD-10-CM | POA: Diagnosis present

## 2023-03-10 DIAGNOSIS — E871 Hypo-osmolality and hyponatremia: Secondary | ICD-10-CM | POA: Diagnosis present

## 2023-03-10 DIAGNOSIS — Z87891 Personal history of nicotine dependence: Secondary | ICD-10-CM

## 2023-03-10 DIAGNOSIS — N179 Acute kidney failure, unspecified: Secondary | ICD-10-CM | POA: Diagnosis present

## 2023-03-10 DIAGNOSIS — E878 Other disorders of electrolyte and fluid balance, not elsewhere classified: Secondary | ICD-10-CM | POA: Diagnosis present

## 2023-03-10 DIAGNOSIS — R6520 Severe sepsis without septic shock: Secondary | ICD-10-CM | POA: Diagnosis present

## 2023-03-10 DIAGNOSIS — I447 Left bundle-branch block, unspecified: Secondary | ICD-10-CM | POA: Diagnosis present

## 2023-03-10 DIAGNOSIS — Z7984 Long term (current) use of oral hypoglycemic drugs: Secondary | ICD-10-CM

## 2023-03-10 DIAGNOSIS — Z1152 Encounter for screening for COVID-19: Secondary | ICD-10-CM | POA: Diagnosis not present

## 2023-03-10 DIAGNOSIS — E875 Hyperkalemia: Secondary | ICD-10-CM | POA: Diagnosis present

## 2023-03-10 DIAGNOSIS — G8929 Other chronic pain: Secondary | ICD-10-CM | POA: Diagnosis present

## 2023-03-10 DIAGNOSIS — G3184 Mild cognitive impairment, so stated: Secondary | ICD-10-CM | POA: Diagnosis present

## 2023-03-10 DIAGNOSIS — E872 Acidosis, unspecified: Secondary | ICD-10-CM | POA: Diagnosis present

## 2023-03-10 DIAGNOSIS — A419 Sepsis, unspecified organism: Secondary | ICD-10-CM | POA: Diagnosis present

## 2023-03-10 DIAGNOSIS — I5022 Chronic systolic (congestive) heart failure: Secondary | ICD-10-CM | POA: Diagnosis present

## 2023-03-10 DIAGNOSIS — J189 Pneumonia, unspecified organism: Secondary | ICD-10-CM | POA: Diagnosis present

## 2023-03-10 DIAGNOSIS — E860 Dehydration: Secondary | ICD-10-CM | POA: Diagnosis present

## 2023-03-10 DIAGNOSIS — I4811 Longstanding persistent atrial fibrillation: Secondary | ICD-10-CM | POA: Diagnosis present

## 2023-03-10 DIAGNOSIS — F419 Anxiety disorder, unspecified: Secondary | ICD-10-CM | POA: Diagnosis present

## 2023-03-10 DIAGNOSIS — Z96641 Presence of right artificial hip joint: Secondary | ICD-10-CM | POA: Diagnosis present

## 2023-03-10 DIAGNOSIS — E213 Hyperparathyroidism, unspecified: Secondary | ICD-10-CM | POA: Diagnosis present

## 2023-03-10 DIAGNOSIS — M19011 Primary osteoarthritis, right shoulder: Secondary | ICD-10-CM | POA: Diagnosis present

## 2023-03-10 DIAGNOSIS — Z8249 Family history of ischemic heart disease and other diseases of the circulatory system: Secondary | ICD-10-CM

## 2023-03-10 DIAGNOSIS — E119 Type 2 diabetes mellitus without complications: Secondary | ICD-10-CM | POA: Diagnosis present

## 2023-03-10 DIAGNOSIS — E041 Nontoxic single thyroid nodule: Secondary | ICD-10-CM | POA: Diagnosis present

## 2023-03-10 DIAGNOSIS — Z8521 Personal history of malignant neoplasm of larynx: Secondary | ICD-10-CM

## 2023-03-10 DIAGNOSIS — M069 Rheumatoid arthritis, unspecified: Secondary | ICD-10-CM | POA: Diagnosis present

## 2023-03-10 DIAGNOSIS — D84821 Immunodeficiency due to drugs: Secondary | ICD-10-CM | POA: Diagnosis present

## 2023-03-10 DIAGNOSIS — E0590 Thyrotoxicosis, unspecified without thyrotoxic crisis or storm: Secondary | ICD-10-CM | POA: Diagnosis present

## 2023-03-10 DIAGNOSIS — Z7901 Long term (current) use of anticoagulants: Secondary | ICD-10-CM

## 2023-03-10 DIAGNOSIS — E861 Hypovolemia: Secondary | ICD-10-CM | POA: Diagnosis present

## 2023-03-10 DIAGNOSIS — Z91040 Latex allergy status: Secondary | ICD-10-CM

## 2023-03-10 DIAGNOSIS — I959 Hypotension, unspecified: Secondary | ICD-10-CM | POA: Diagnosis present

## 2023-03-10 DIAGNOSIS — Z881 Allergy status to other antibiotic agents status: Secondary | ICD-10-CM

## 2023-03-10 DIAGNOSIS — Z79899 Other long term (current) drug therapy: Secondary | ICD-10-CM

## 2023-03-10 DIAGNOSIS — I4891 Unspecified atrial fibrillation: Secondary | ICD-10-CM

## 2023-03-10 LAB — COMPREHENSIVE METABOLIC PANEL
ALT: 17 U/L (ref 0–44)
AST: 26 U/L (ref 15–41)
Albumin: 3.7 g/dL (ref 3.5–5.0)
Alkaline Phosphatase: 238 U/L — ABNORMAL HIGH (ref 38–126)
Anion gap: 11 (ref 5–15)
BUN: 21 mg/dL (ref 8–23)
CO2: 18 mmol/L — ABNORMAL LOW (ref 22–32)
Calcium: 9.9 mg/dL (ref 8.9–10.3)
Chloride: 96 mmol/L — ABNORMAL LOW (ref 98–111)
Creatinine, Ser: 1.28 mg/dL — ABNORMAL HIGH (ref 0.44–1.00)
GFR, Estimated: 44 mL/min — ABNORMAL LOW (ref >60)
Glucose, Bld: 106 mg/dL — ABNORMAL HIGH (ref 70–99)
Potassium: 4.8 mmol/L (ref 3.5–5.1)
Sodium: 125 mmol/L — ABNORMAL LOW (ref 135–145)
Total Bilirubin: 0.6 mg/dL (ref 0.3–1.2)
Total Protein: 7.1 g/dL (ref 6.5–8.1)

## 2023-03-10 LAB — URINALYSIS, W/ REFLEX TO CULTURE (INFECTION SUSPECTED)
Bacteria, UA: NONE SEEN
Bilirubin Urine: NEGATIVE
Glucose, UA: 500 mg/dL — AB
Ketones, ur: NEGATIVE mg/dL
Nitrite: POSITIVE — AB
Protein, ur: 30 mg/dL — AB
Specific Gravity, Urine: 1.015 (ref 1.005–1.030)
WBC, UA: >50 WBC/hpf (ref 0–5)
pH: 6 (ref 5.0–8.0)

## 2023-03-10 LAB — CBC WITH DIFFERENTIAL/PLATELET
Abs Immature Granulocytes: 0.06 10*3/uL (ref 0.00–0.07)
Basophils Absolute: 0 10*3/uL (ref 0.0–0.1)
Basophils Relative: 0 %
Eosinophils Absolute: 0 10*3/uL (ref 0.0–0.5)
Eosinophils Relative: 0 %
HCT: 35.5 % — ABNORMAL LOW (ref 36.0–46.0)
Hemoglobin: 12.2 g/dL (ref 12.0–15.0)
Immature Granulocytes: 1 %
Lymphocytes Relative: 7 %
Lymphs Abs: 0.8 10*3/uL (ref 0.7–4.0)
MCH: 32.2 pg (ref 26.0–34.0)
MCHC: 34.4 g/dL (ref 30.0–36.0)
MCV: 93.7 fL (ref 80.0–100.0)
Monocytes Absolute: 0.7 10*3/uL (ref 0.1–1.0)
Monocytes Relative: 6 %
Neutro Abs: 10.1 10*3/uL — ABNORMAL HIGH (ref 1.7–7.7)
Neutrophils Relative %: 86 %
Platelets: 188 10*3/uL (ref 150–400)
RBC: 3.79 MIL/uL — ABNORMAL LOW (ref 3.87–5.11)
RDW: 14.2 % (ref 11.5–15.5)
WBC: 11.7 10*3/uL — ABNORMAL HIGH (ref 4.0–10.5)
nRBC: 0 % (ref 0.0–0.2)

## 2023-03-10 LAB — LACTIC ACID, PLASMA: Lactic Acid, Venous: 1.3 mmol/L (ref 0.5–1.9)

## 2023-03-10 LAB — APTT: aPTT: 44 seconds — ABNORMAL HIGH (ref 24–36)

## 2023-03-10 LAB — RESP PANEL BY RT-PCR (RSV, FLU A&B, COVID)  RVPGX2
Influenza A by PCR: NEGATIVE
Influenza B by PCR: NEGATIVE
Resp Syncytial Virus by PCR: NEGATIVE
SARS Coronavirus 2 by RT PCR: NEGATIVE

## 2023-03-10 LAB — TSH: TSH: 1.089 u[IU]/mL (ref 0.350–4.500)

## 2023-03-10 LAB — PROTIME-INR
INR: 1.5 — ABNORMAL HIGH (ref 0.8–1.2)
Prothrombin Time: 18.2 seconds — ABNORMAL HIGH (ref 11.4–15.2)

## 2023-03-10 LAB — LIPASE, BLOOD: Lipase: 21 U/L (ref 11–51)

## 2023-03-10 MED ORDER — SODIUM CHLORIDE 0.9 % IV SOLN
2.0000 g | Freq: Once | INTRAVENOUS | Status: AC
  Administered 2023-03-10: 2 g via INTRAVENOUS
  Filled 2023-03-10: qty 12.5

## 2023-03-10 MED ORDER — LACTATED RINGERS IV SOLN: INTRAVENOUS | Status: DC

## 2023-03-10 MED ORDER — OXYCODONE HCL 5 MG PO TABS
5.0000 mg | ORAL_TABLET | ORAL | Status: DC | PRN
  Administered 2023-03-10 – 2023-03-11 (×2): 5 mg via ORAL
  Filled 2023-03-10 (×2): qty 1

## 2023-03-10 MED ORDER — IBUPROFEN 800 MG PO TABS
800.0000 mg | ORAL_TABLET | Freq: Once | ORAL | Status: AC
  Administered 2023-03-10: 800 mg via ORAL
  Filled 2023-03-10: qty 1

## 2023-03-10 MED ORDER — IOHEXOL 350 MG/ML SOLN
100.0000 mL | Freq: Once | INTRAVENOUS | Status: AC | PRN
  Administered 2023-03-10: 85 mL via INTRAVENOUS

## 2023-03-10 MED ORDER — OXYCODONE-ACETAMINOPHEN 10-325 MG PO TABS: 1.0000 | ORAL_TABLET | ORAL | Status: DC | PRN

## 2023-03-10 MED ORDER — LACTATED RINGERS IV BOLUS (SEPSIS)
500.0000 mL | Freq: Once | INTRAVENOUS | Status: AC
  Administered 2023-03-10: 500 mL via INTRAVENOUS

## 2023-03-10 MED ORDER — METRONIDAZOLE 500 MG/100ML IV SOLN
500.0000 mg | Freq: Once | INTRAVENOUS | Status: AC
  Administered 2023-03-10: 500 mg via INTRAVENOUS
  Filled 2023-03-10: qty 100

## 2023-03-10 MED ORDER — HYDROMORPHONE HCL 1 MG/ML IJ SOLN
0.5000 mg | Freq: Once | INTRAMUSCULAR | Status: AC
  Administered 2023-03-10: 0.5 mg via INTRAVENOUS
  Filled 2023-03-10: qty 1

## 2023-03-10 MED ORDER — OXYCODONE-ACETAMINOPHEN 5-325 MG PO TABS
1.0000 | ORAL_TABLET | ORAL | Status: DC | PRN
  Administered 2023-03-11 – 2023-03-12 (×2): 1 via ORAL
  Filled 2023-03-10 (×2): qty 1

## 2023-03-10 MED ORDER — VANCOMYCIN HCL IN DEXTROSE 1-5 GM/200ML-% IV SOLN: 1000.0000 mg | INTRAVENOUS | Status: DC

## 2023-03-10 MED ORDER — SODIUM CHLORIDE 0.9 % IV SOLN: INTRAVENOUS | Status: DC | PRN

## 2023-03-10 MED ORDER — LACTATED RINGERS IV BOLUS (SEPSIS)
1000.0000 mL | Freq: Once | INTRAVENOUS | Status: AC
  Administered 2023-03-10: 1000 mL via INTRAVENOUS

## 2023-03-10 MED ORDER — VANCOMYCIN HCL IN DEXTROSE 1-5 GM/200ML-% IV SOLN
1000.0000 mg | Freq: Once | INTRAVENOUS | Status: AC
  Administered 2023-03-10: 1000 mg via INTRAVENOUS
  Filled 2023-03-10: qty 200

## 2023-03-10 MED ORDER — LACTATED RINGERS IV BOLUS (SEPSIS)
250.0000 mL | Freq: Once | INTRAVENOUS | Status: AC
  Administered 2023-03-10: 250 mL via INTRAVENOUS

## 2023-03-10 MED ORDER — SODIUM CHLORIDE 0.9 % IV SOLN: 2.0000 g | INTRAVENOUS | Status: DC

## 2023-03-10 NOTE — ED Provider Notes (Signed)
Patient was admitted at this time to the hospitalist for a stepdown bed or progressive bed at [facility], given her borderline hypotension.  Her MAP maintained above 65 and she is not requiring peripheral vasopressors.  CT scans were concerning for multifocal pneumonia, edematous gallbladder and potential pancreatitis.  Her alk phos level is mildly elevated but she does not have a transaminitis and her T bilirubin levels within normal limits.  We are adding on a lipase level.  She is already been covered with empiric broad-spectrum antibiotics for both pneumonia intra-abdominal process.  She will likely need an ultrasound of the gallbladder performed upon arrival at the hospital, but this is not available at this time here in the freestanding ED.  I discussed this plan of action of the hospitalist, who will consult with GI and surgery as needed upon the patient's arrival, pending her further imaging.   Terald Sleeper, MD 03/10/23 [phone]

## 2023-03-10 NOTE — Telephone Encounter (Signed)
LMOM for patient, returning patient's call.

## 2023-03-10 NOTE — Sepsis Progress Note (Signed)
Sepsis protocol monitored by [facility] ?

## 2023-03-10 NOTE — Telephone Encounter (Signed)
Attempted to contact the patient and left a message to call the office back. 

## 2023-03-10 NOTE — ED Notes (Addendum)
Patient resting quietly in stretcher, respirations even, unlabored, no acute distress noted. Explained to patient's son that admitting MD would like to evaluate patient prior to ordering home meds. Patient's son at bedside, upset that admitting MD is not ordering her home medications. Patient's son continues to be upset, contacting other family members to discuss whether to take pt AMA.

## 2023-03-10 NOTE — ED Notes (Signed)
Patient ambulatory to restroom with son's assistance.

## 2023-03-10 NOTE — ED Provider Triage Note (Signed)
Emergency Medicine Provider Triage Evaluation Note  Rhonda Marshall , a 73 y.o. female  was evaluated in triage.  Pt complains of fever and weakness. T max of 104. + RA, + hot thyroid nodule. No cough, n/v .  Review of Systems  Positive: Fever and weakness Negative: vomiting  Physical Exam  There were no vitals taken for this visit. Gen:   Awake, no distress   Resp:  Normal effort  MSK:   Moves extremities without difficulty  Other:  + afib s/rvr.., lethargic hypotensive  Medical Decision Making  Medically screening exam initiated at 1:13 PM.  Appropriate orders placed.  Rhonda Marshall was informed that the remainder of the evaluation will be completed by another provider, this initial triage assessment does not replace that evaluation, and the importance of remaining in the ED until their evaluation is complete.     Arthor Captain, PA-C 03/10/23 1415

## 2023-03-10 NOTE — ED Notes (Signed)
Report called to [place], Charity fundraiser. [facility] en route with patient.

## 2023-03-10 NOTE — Progress Notes (Signed)
Pharmacy Antibiotic Note  Rhonda Marshall is a 72 y.o. female admitted on (Not on file) with sepsis. Pharmacy has been consulted for vancomycin and cefepime dosing. Pt is febrile and WBC is elevated at 11.7. SCr is elevated above baseline and lactic acid is <2.   Plan: Vancomycin 1g IV Q48H (AUC 465, SCr 1.28) Cefepime 2g IV Q24H F/u renal fxn, C&S, clinical status and peak/trough at SS  Height: 5\' 1"  (154.9 cm) Weight: 51.7 kg (114 lb) IBW/kg (Calculated) : 47.8  Temp (24hrs), Avg:101.4 F (38.6 C), Min:101.4 F (38.6 C), Max:101.4 F (38.6 C)  Recent Labs  Lab 03/10/23 1330  WBC 11.7*  CREATININE 1.28*  LATICACIDVEN 1.3    Estimated Creatinine Clearance: 29.5 mL/min (A) (by C-G formula based on SCr of 1.28 mg/dL (H)).    Allergies  Allergen Reactions   Clindamycin/Lincomycin Rash   Latex Rash    Latex gloves    Antimicrobials this admission: Vanc 8/27>> Cefepime 8/27>> Flagyl x 1 8/27  Dose adjustments this admission: N/A  Microbiology results: Pending  Thank you for allowing pharmacy to be a part of this patient's care.  Matisyn Cabeza, Drake Leach 03/10/2023 1:22 PM

## 2023-03-10 NOTE — ED Notes (Signed)
Per ED and IP MDs, pt OK to eat. Provided pt with jello and pudding. [facility] here for pt transport.

## 2023-03-10 NOTE — ED Provider Notes (Signed)
[place] [facility] Provider Note  CSN: [id number] Arrival date & time: 03/10/23 1251  Chief Complaint(s) Fever  HPI Rhonda Marshall is a 72 y.o. female with past medical history as below, significant for afib, RA, HTN, hyperthyroid who presents to the ED with complaint of fever fatigue.   Feeling unwell last 2-3 days, fever at home tmax 103. No cp or dib. Reports reduced PO intake last few days 2/2 feeling unwell. No sick contacts or recent travel. Taking her typical medications, pt reports also mild cough some chills with her fever, malaise.   Past Medical History Past Medical History:  Diagnosis Date   Anxiety    Atrial fibrillation (HCC)    newly diagnosed 10/2010   Family history of adverse reaction to anesthesia    Sister's BP drops    Hip fracture (HCC) 01/18/2022   Hypertension    Rheumatoid arthritis(714.0)    Subclinical hyperthyroidism    Patient Active Problem List   Diagnosis Date Noted   AKI (acute kidney injury) (HCC) 03/11/2023   Metabolic acidosis 03/11/2023   Hyponatremia 03/11/2023   Sepsis (HCC) 03/10/2023   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   Bilateral shoulder pain 09/10/2022   S/P right hip fracture 08/20/2022   Hypotension 05/25/2022   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   Elevated troponin 01/19/2022   Hypokalemia 01/19/2022   LBBB (left bundle branch block) 01/19/2022   Dehydration 10/10/2019   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  Authorizing Provider  acetaminophen (TYLENOL) 325 MG tablet Take 1-2 tablets (325-650 mg total) by mouth every 6 (six) hours as needed for mild pain (pain score 1-3 or temp > 100.5). 01/25/22  Yes Dahal, Melina Schools, MD  ALPRAZolam Prudy Feeler) 1 MG tablet Take 1 mg by mouth at bedtime as needed for anxiety.   Yes [provider]  ELIQUIS 5 MG TABS tablet Take 1 tablet (5 mg total) by mouth 2 (two) times daily. 12/22/22  Yes Rollene Rotunda, MD  empagliflozin (JARDIANCE) 10 MG TABS tablet Take 1 tablet (10 mg total) by mouth daily. 12/17/22  Yes Rollene Rotunda, MD  ENTRESTO 24-26 MG Take 1 tablet by mouth 2 (two) times daily. 10/09/22  Yes Rollene Rotunda, MD  hydroxychloroquine (PLAQUENIL) 200 MG tablet Take 200 mg by mouth daily. 03/07/23  Yes [provider]  methimazole (TAPAZOLE) 5 MG tablet Take 5 mg by mouth daily. 03/04/23  Yes [provider]  metoprolol succinate (TOPROL-XL) 100 MG 24 hr tablet Take 1 tablet (100 mg total) by mouth 2 (two) times daily. Take with or immediately following a meal. 12/10/22  Yes Hochrein, Fayrene Fearing, MD  Multiple Vitamin (MULTIVITAMIN WITH MINERALS) TABS tablet Take 1 tablet by mouth daily.    Yes [provider]  oxyCODONE-acetaminophen (PERCOCET) 10-325 MG tablet Take 1 tablet by mouth every 4 (four) hours as needed for pain. 01/24/22  Yes Janine Ores K, PA-C  Vitamin D, Ergocalciferol, (DRISDOL) 1.25 MG (50000 UNIT) CAPS capsule Take 50,000 Units by mouth once a week. 12/28/21  Yes [provider]  Past Surgical History Past Surgical History:  Procedure Laterality Date   Arm surgery     fracture, right arm   CARDIOVERSION N/A 08/10/2014   Procedure: CARDIOVERSION;  Surgeon: Lars Masson, MD;  Location: [facility] [place] ENDOSCOPY;  Service: Cardiovascular;  Laterality: N/A;   CARDIOVERSION N/A 01/22/2022    Procedure: CARDIOVERSION;  Surgeon: Meriam Sprague, MD;  Location: [facility] ENDOSCOPY;  Service: Cardiovascular;  Laterality: N/A;   CATARACT EXTRACTION     DIRECT LARYNGOSCOPY N/A 09/02/2019   Procedure: DIRECT LARYNGOSCOPY WITH BIOPSY;  Surgeon: Drema Halon, MD;  Location: [facility];  Service: ENT;  Laterality: N/A;   HIP ARTHROPLASTY Right 01/19/2022   Procedure: ARTHROPLASTY BIPOLAR HIP (HEMIARTHROPLASTY);  Surgeon: Teryl Lucy, MD;  Location: [facility] ORS;  Service: Orthopedics;  Laterality: Right;   IR GASTROSTOMY TUBE MOD SED  10/17/2019   IR GASTROSTOMY TUBE REMOVAL  01/03/2020   TEE WITHOUT CARDIOVERSION N/A 01/22/2022   Procedure: TRANSESOPHAGEAL ECHOCARDIOGRAM (TEE);  Surgeon: Meriam Sprague, MD;  Location: [facility] ENDOSCOPY;  Service: Cardiovascular;  Laterality: N/A;   TUBAL LIGATION     Family History Family History  Problem Relation Age of Onset   Heart attack Mother 23       pacemaker age 42   Hypothyroidism Mother    Cancer Father        colon and prostate   Hypothyroidism Sister    Cancer Sister    Diabetes Brother    CAD Brother 27   Cancer Brother        Renal    Social History Social History   Tobacco Use   Smoking status: Former    Current packs/day: 0.00    Average packs/day: 0.3 packs/day for 54.2 years (13.6 ttl pk-yrs)    Types: Cigarettes    Start date: 76    Quit date: 10/2019    Years since quitting: 3.4    Passive exposure: Past   Smokeless tobacco: Never  Vaping Use   Vaping status: Never Used  Substance Use Topics   Alcohol use: Not Currently    Alcohol/week: 0.0 standard drinks of alcohol   Drug use: No   Allergies Clindamycin/lincomycin and Latex  Review of Systems Review of Systems  Constitutional:  Positive for chills, fatigue and fever.  Respiratory:  Positive for cough.   Gastrointestinal:  Negative for abdominal pain.  Musculoskeletal:  Positive for arthralgias.  All other systems reviewed and are  negative.   Physical Exam Vital Signs  I have reviewed the triage vital signs BP 126/80 (BP Location: Right Arm)   Pulse (!) 118   Temp 98 F (36.7 C) (Oral)   Resp (!) 22   Ht 5\' 1"  (1.549 m)   Wt 51.7 kg   SpO2 93%   BMI 21.54 kg/m  Physical Exam Vitals and nursing note reviewed.  Constitutional:      General: She is not in acute distress.    Appearance: Normal appearance.  HENT:     Head: Normocephalic and atraumatic.     Right Ear: External ear normal.     Left Ear: External ear normal.     Nose: Nose normal.     Mouth/Throat:     Mouth: Mucous membranes are moist.  Eyes:     General: No scleral icterus.       Right eye: No discharge.        Left eye: No discharge.  Cardiovascular:     Rate and Rhythm: Tachycardia present. Rhythm  irregular.     Pulses: Normal pulses.     Heart sounds: Normal heart sounds.  Pulmonary:     Effort: Pulmonary effort is normal. No respiratory distress.     Breath sounds: No stridor.     Comments: Coarse breath sounds b/l Abdominal:     General: Abdomen is flat. There is no distension.     Palpations: Abdomen is soft.     Tenderness: There is no abdominal tenderness.  Musculoskeletal:     Cervical back: No rigidity.     Right lower leg: No edema.     Left lower leg: No edema.  Skin:    General: Skin is warm and dry.     Capillary Refill: Capillary refill takes less than 2 seconds.  Neurological:     Mental Status: She is alert.  Psychiatric:        Mood and Affect: Mood normal.        Behavior: Behavior normal. Behavior is cooperative.     ED Results and Treatments Labs (all labs ordered are listed, but only abnormal results are displayed) Labs Reviewed  URINE CULTURE - Abnormal; Notable for the following components:      Result Value   Culture   (*)    Value: <10,000 COLONIES/mL INSIGNIFICANT GROWTH Performed at [facility], [address], [place], [place] [address]    All other components within normal  limits  COMPREHENSIVE METABOLIC PANEL - Abnormal; Notable for the following components:   Sodium 125 (*)    Chloride 96 (*)    CO2 18 (*)    Glucose, Bld 106 (*)    Creatinine, Ser 1.28 (*)    Alkaline Phosphatase 238 (*)    GFR, Estimated 44 (*)    All other components within normal limits  CBC WITH DIFFERENTIAL/PLATELET - Abnormal; Notable for the following components:   WBC 11.7 (*)    RBC 3.79 (*)    HCT 35.5 (*)    Neutro Abs 10.1 (*)    All other components within normal limits  PROTIME-INR - Abnormal; Notable for the following components:   Prothrombin Time 18.2 (*)    INR 1.5 (*)    All other components within normal limits  APTT - Abnormal; Notable for the following components:   aPTT 44 (*)    All other components within normal limits  URINALYSIS, W/ REFLEX TO CULTURE (INFECTION SUSPECTED) - Abnormal; Notable for the following components:   APPearance HAZY (*)    Glucose, UA 500 (*)    Hgb urine dipstick SMALL (*)    Protein, ur 30 (*)    Nitrite POSITIVE (*)    Leukocytes,Ua LARGE (*)    All other components within normal limits  CBC WITH DIFFERENTIAL/PLATELET - Abnormal; Notable for the following components:   RBC 3.77 (*)    Hemoglobin 11.7 (*)    HCT 35.6 (*)    All other components within normal limits  BRAIN NATRIURETIC PEPTIDE - Abnormal; Notable for the following components:   B Natriuretic Peptide 2,232.7 (*)    All other components within normal limits  COMPREHENSIVE METABOLIC PANEL - Abnormal; Notable for the following components:   Sodium 134 (*)    Chloride 97 (*)    CO2 21 (*)    Glucose, Bld 102 (*)    Creatinine, Ser 1.37 (*)    Albumin 2.8 (*)    AST 43 (*)    Alkaline Phosphatase 246 (*)    GFR, Estimated  41 (*)    Anion gap 16 (*)    All other components within normal limits  C-REACTIVE PROTEIN - Abnormal; Notable for the following components:   CRP 21.9 (*)    All other components within normal limits  BETA-HYDROXYBUTYRIC ACID -  Abnormal; Notable for the following components:   Beta-Hydroxybutyric Acid 0.93 (*)    All other components within normal limits  RESP PANEL BY RT-PCR (RSV, FLU A&B, COVID)  RVPGX2  CULTURE, BLOOD (ROUTINE X 2)  CULTURE, BLOOD (ROUTINE X 2)  MRSA NEXT GEN BY PCR, NASAL  LACTIC ACID, PLASMA  TSH  LIPASE, BLOOD  PROCALCITONIN  MAGNESIUM  OSMOLALITY  URIC ACID  TSH  T4, FREE  T4, FREE  T3, FREE  CREATININE, URINE, RANDOM  OSMOLALITY, URINE  UREA NITROGEN, URINE  SODIUM, URINE, RANDOM  CBC WITH DIFFERENTIAL/PLATELET  MAGNESIUM  C-REACTIVE PROTEIN  PROCALCITONIN  COMPREHENSIVE METABOLIC PANEL  BRAIN NATRIURETIC PEPTIDE                                                                                                                          Radiology DG Chest Port 1 View  Result Date: 03/11/2023 CLINICAL DATA:  Fever and shortness of breath. EXAM: PORTABLE CHEST 1 VIEW COMPARISON:  03/10/2023 FINDINGS: There is mild cardiac enlargement. Diffusely increased interstitial markings are noted concerning for interstitial edema. Persistent bilateral lower lobe peribronchovascular airspace opacities. No signs of pleural effusion. Visualized osseous structures are unremarkable. IMPRESSION: 1. No change from previous exam. 2. Cardiomegaly and interstitial edema. 3. Persistent bilateral lower lobe peribronchovascular airspace opacities compatible with multifocal pneumonia. Electronically Signed   By: Signa Kell M.D.   On: 03/11/2023 06:41   US Abdomen Limited RUQ (LIVER/GB)  Result Date: 03/10/2023 CLINICAL DATA:  Acute pancreatitis. EXAM: ULTRASOUND ABDOMEN LIMITED RIGHT UPPER QUADRANT COMPARISON:  CT 03/10/2023 FINDINGS: Gallbladder: No gallstones or sludge. Gallbladder wall is mildly thickened at 3.8 mm. Murphy's sign is negative. Common bile duct: Diameter: 7 mm, normal Liver: No focal lesion identified. Within normal limits in parenchymal echogenicity. Portal vein is patent on color  Doppler imaging with normal direction of blood flow towards the liver. Other: Dilated pancreatic duct at 5 mm. IMPRESSION: 1. Mild gallbladder wall thickening. No evidence of cholelithiasis or acute cholecystitis. 2. Pancreatic ductal dilatation, nonspecific. Electronically Signed   By: Burman Nieves M.D.   On: 03/10/2023 22:47    Pertinent labs & imaging results that were available during my care of the patient were reviewed by me and considered in my medical decision making (see MDM for details).  Medications Ordered in ED Medications  oxyCODONE-acetaminophen (PERCOCET/ROXICET) 5-325 MG per tablet 1 tablet (1 tablet Oral Given 03/11/23 1316)    And  oxyCODONE (Oxy IR/ROXICODONE) immediate release tablet 5 mg (5 mg Oral Given 03/11/23 1316)  acetaminophen (TYLENOL) tablet 325-650 mg (has no administration in time range)  ALPRAZolam (XANAX) tablet 1 mg (1 mg Oral Given 03/11/23 0053)  methimazole (TAPAZOLE) tablet 5  mg (5 mg Oral Given 03/11/23 0836)  apixaban (ELIQUIS) tablet 5 mg (5 mg Oral Given 03/11/23 0836)  diltiazem (CARDIZEM) injection 10 mg (10 mg Intravenous Given 03/11/23 0829)  azithromycin (ZITHROMAX) tablet 500 mg (500 mg Oral Given 03/11/23 0836)  cefTRIAXone (ROCEPHIN) 1 g in sodium chloride 0.9 % 100 mL IVPB (1 g Intravenous New Bag/Given 03/11/23 0836)  metoprolol tartrate (LOPRESSOR) tablet 100 mg (has no administration in time range)  lactated ringers infusion ( Intravenous New Bag/Given 03/11/23 1612)  methylPREDNISolone sodium succinate (SOLU-MEDROL) 40 mg/mL injection 40 mg (40 mg Intravenous Given 03/11/23 1205)  lactated ringers bolus 1,000 mL (0 mLs Intravenous Stopped 03/10/23 1500)    And  lactated ringers bolus 500 mL (0 mLs Intravenous Stopped 03/10/23 1430)    And  lactated ringers bolus 250 mL (0 mLs Intravenous Stopped 03/10/23 1514)  ibuprofen (ADVIL) tablet 800 mg (800 mg Oral Given 03/10/23 1344)  ceFEPIme (MAXIPIME) 2 g in sodium chloride 0.9 % 100 mL IVPB (0 g  Intravenous Stopped 03/10/23 1423)  metroNIDAZOLE (FLAGYL) IVPB 500 mg (0 mg Intravenous Stopped 03/10/23 1544)  vancomycin (VANCOCIN) IVPB 1000 mg/200 mL premix (0 mg Intravenous Stopped 03/10/23 1642)  iohexol (OMNIPAQUE) 350 MG/ML injection 100 mL (85 mLs Intravenous Contrast Given 03/10/23 1717)  HYDROmorphone (DILAUDID) injection 0.5 mg (0.5 mg Intravenous Given 03/10/23 2021)  digoxin (LANOXIN) 0.25 MG/ML injection 0.25 mg (0.25 mg Intravenous Given 03/11/23 1606)  lactated ringers bolus 500 mL (500 mLs Intravenous New Bag/Given 03/11/23 0828)  metoprolol tartrate (LOPRESSOR) tablet 50 mg (50 mg Oral Given 03/11/23 1205)                                                                                                                                     Procedures .Critical Care  Performed by: Sloan Leiter, DO Authorized by: Sloan Leiter, DO   Critical care provider statement:    Critical care time (minutes):  49   Critical care time was exclusive of:  Separately billable procedures and treating other patients   Critical care was necessary to treat or prevent imminent or life-threatening deterioration of the following conditions:  Sepsis   Critical care was time spent personally by me on the following activities:  Development of treatment plan with patient or surrogate, discussions with consultants, evaluation of patient's response to treatment, examination of patient, ordering and review of laboratory studies, ordering and review of radiographic studies, ordering and performing treatments and interventions, pulse oximetry, re-evaluation of patient's condition, review of old charts and obtaining history from patient or surrogate   (including critical care time)  Medical Decision Making / ED Course    Medical Decision Making:    Australia Penniman is a 72 y.o. female with history as above here with fever/chills/fatigue/cough. The complaint involves an extensive differential diagnosis and  also carries with it a high risk of complications and morbidity.  Serious etiology was  considered. Ddx includes but is not limited to: Differential diagnosis for adult fever includes but is not exclusive to community-acquired pneumonia, urinary tract infection, acute cholecystitis, viral syndrome, cellulitis, tick bourne disease,  decubitus ulcer, necrotizing fasciitis, meningitis, encephalitis, influenza, etc.   Complete initial physical exam performed, notably the patient  was afib rvr, febrile, no hypoxia.    Reviewed and confirmed nursing documentation for past medical history, family history, social history.  Vital signs reviewed.    Narrative: 72 yo female with hx as above here with fever/chills/fatigue/cough. Afib RVR on arrival and hypotensive, febrile. SIRS on arrival, unknown source. CXR concerning for PNA, sepsis activated. Broad spectrum abx and ivf resus in progress. Hemodynamics improved on re-check, borderline hypotension but MAP stable >65. Hyponatremia noted as well, fluids in process. Pending CT chest/abd/pelvis, will require admission. Signed out to incoming EDP pending imaging/admit  Clinical Course as of 03/11/23 2015  Tue Mar 10, 2023  1412 Sodium(!): 125 Progressively worsened over last few months [SG]  1412 Creatinine(!): 1.28 Worsened from prior  [SG]    Clinical Course User Index [SG] Sloan Leiter, DO                     Additional history obtained: -Additional history obtained from family -External records from outside source obtained and reviewed including: Chart review including previous notes, labs, imaging, consultation notes including  Home meds Prior labs Prior imaging    Lab Tests: -I ordered, reviewed, and interpreted labs.   The pertinent results include:   Labs Reviewed  URINE CULTURE - Abnormal; Notable for the following components:      Result Value   Culture   (*)    Value: <10,000 COLONIES/mL INSIGNIFICANT  GROWTH Performed at [facility], [address], [place], [place] [address]    All other components within normal limits  COMPREHENSIVE METABOLIC PANEL - Abnormal; Notable for the following components:   Sodium 125 (*)    Chloride 96 (*)    CO2 18 (*)    Glucose, Bld 106 (*)    Creatinine, Ser 1.28 (*)    Alkaline Phosphatase 238 (*)    GFR, Estimated 44 (*)    All other components within normal limits  CBC WITH DIFFERENTIAL/PLATELET - Abnormal; Notable for the following components:   WBC 11.7 (*)    RBC 3.79 (*)    HCT 35.5 (*)    Neutro Abs 10.1 (*)    All other components within normal limits  PROTIME-INR - Abnormal; Notable for the following components:   Prothrombin Time 18.2 (*)    INR 1.5 (*)    All other components within normal limits  APTT - Abnormal; Notable for the following components:   aPTT 44 (*)    All other components within normal limits  URINALYSIS, W/ REFLEX TO CULTURE (INFECTION SUSPECTED) - Abnormal; Notable for the following components:   APPearance HAZY (*)    Glucose, UA 500 (*)    Hgb urine dipstick SMALL (*)    Protein, ur 30 (*)    Nitrite POSITIVE (*)    Leukocytes,Ua LARGE (*)    All other components within normal limits  CBC WITH DIFFERENTIAL/PLATELET - Abnormal; Notable for the following components:   RBC 3.77 (*)    Hemoglobin 11.7 (*)    HCT 35.6 (*)    All other components within normal limits  BRAIN NATRIURETIC PEPTIDE - Abnormal; Notable for the following components:   B Natriuretic Peptide 2,232.7 (*)  All other components within normal limits  COMPREHENSIVE METABOLIC PANEL - Abnormal; Notable for the following components:   Sodium 134 (*)    Chloride 97 (*)    CO2 21 (*)    Glucose, Bld 102 (*)    Creatinine, Ser 1.37 (*)    Albumin 2.8 (*)    AST 43 (*)    Alkaline Phosphatase 246 (*)    GFR, Estimated 41 (*)    Anion gap 16 (*)    All other components within normal limits  C-REACTIVE PROTEIN - Abnormal; Notable  for the following components:   CRP 21.9 (*)    All other components within normal limits  BETA-HYDROXYBUTYRIC ACID - Abnormal; Notable for the following components:   Beta-Hydroxybutyric Acid 0.93 (*)    All other components within normal limits  RESP PANEL BY RT-PCR (RSV, FLU A&B, COVID)  RVPGX2  CULTURE, BLOOD (ROUTINE X 2)  CULTURE, BLOOD (ROUTINE X 2)  MRSA NEXT GEN BY PCR, NASAL  LACTIC ACID, PLASMA  TSH  LIPASE, BLOOD  PROCALCITONIN  MAGNESIUM  OSMOLALITY  URIC ACID  TSH  T4, FREE  T4, FREE  T3, FREE  CREATININE, URINE, RANDOM  OSMOLALITY, URINE  UREA NITROGEN, URINE  SODIUM, URINE, RANDOM  CBC WITH DIFFERENTIAL/PLATELET  MAGNESIUM  C-REACTIVE PROTEIN  PROCALCITONIN  COMPREHENSIVE METABOLIC PANEL  BRAIN NATRIURETIC PEPTIDE    Notable for hyponatremia  EKG   EKG Interpretation Date/Time:  Tuesday March 10 2023 13:20:28 EDT Ventricular Rate:  143 PR Interval:    QRS Duration:  112 QT Interval:  316 QTC Calculation: 487 R Axis:   -53  Text Interpretation: Atrial fibrillation with rapid ventricular response Left axis deviation Low voltage QRS Cannot rule out Anteroseptal infarct (cited on or before 07-Jan-2023) Abnormal ECG When compared with ECG of 07-Jan-2023 15:37, Vent. rate has increased BY  59 BPM QRS axis Shifted left Nonspecific T wave abnormality no longer evident in Inferior leads Confirmed by Tanda Rockers ([id number]) on 03/10/2023 1:47:26 PM         Imaging Studies ordered: I ordered imaging studies including CXR CT - CAP I independently visualized the following imaging with scope of interpretation limited to determining acute life threatening conditions related to emergency care; findings noted above, significant for CT pending, CXR w/ ?pna I independently visualized and interpreted imaging. I agree with the radiologist interpretation   Medicines ordered and prescription drug management: Meds ordered this encounter  Medications   DISCONTD:  lactated ringers infusion   AND Linked Order Group    lactated ringers bolus 1,000 mL     Order Specific Question:   Enter Patient Weight in Kilograms     Answer:   52    lactated ringers bolus 500 mL     Order Specific Question:   Enter Patient Weight in Kilograms     Answer:   52    lactated ringers bolus 250 mL     Order Specific Question:   Enter Patient Weight in Kilograms     Answer:   52   ibuprofen (ADVIL) tablet 800 mg   ceFEPIme (MAXIPIME) 2 g in sodium chloride 0.9 % 100 mL IVPB    Order Specific Question:   Antibiotic Indication:    Answer:   Other Indication (list below)    Order Specific Question:   Other Indication:    Answer:   Unknown source   metroNIDAZOLE (FLAGYL) IVPB 500 mg    Order Specific Question:   Antibiotic Indication:  Answer:   Other Indication (list below)    Order Specific Question:   Other Indication:    Answer:   Unknown source   vancomycin (VANCOCIN) IVPB 1000 mg/200 mL premix    Order Specific Question:   Indication:    Answer:   Other Indication (list below)    Order Specific Question:   Other Indication:    Answer:   Unknown source   DISCONTD: 0.9 %  sodium chloride infusion    Carrier Fluid Protocol   DISCONTD: ceFEPIme (MAXIPIME) 2 g in sodium chloride 0.9 % 100 mL IVPB    Order Specific Question:   Antibiotic Indication:    Answer:   Sepsis   DISCONTD: vancomycin (VANCOCIN) IVPB 1000 mg/200 mL premix    Order Specific Question:   Indication:    Answer:   Sepsis   iohexol (OMNIPAQUE) 350 MG/ML injection 100 mL   HYDROmorphone (DILAUDID) injection 0.5 mg   DISCONTD: oxyCODONE-acetaminophen (PERCOCET) 10-325 MG per tablet 1 tablet   AND Linked Order Group    oxyCODONE-acetaminophen (PERCOCET/ROXICET) 5-325 MG per tablet 1 tablet    oxyCODONE (Oxy IR/ROXICODONE) immediate release tablet 5 mg   acetaminophen (TYLENOL) tablet 325-650 mg   ALPRAZolam (XANAX) tablet 1 mg   methimazole (TAPAZOLE) tablet 5 mg   apixaban (ELIQUIS) tablet 5  mg   DISCONTD: lactated ringers infusion   DISCONTD: metoprolol tartrate (LOPRESSOR) tablet 50 mg   diltiazem (CARDIZEM) injection 10 mg   digoxin (LANOXIN) 0.25 MG/ML injection 0.25 mg   azithromycin (ZITHROMAX) tablet 500 mg   DISCONTD: lactated ringers infusion   cefTRIAXone (ROCEPHIN) 1 g in sodium chloride 0.9 % 100 mL IVPB    Order Specific Question:   Antibiotic Indication:    Answer:   UTI   lactated ringers bolus 500 mL   metoprolol tartrate (LOPRESSOR) tablet 50 mg   metoprolol tartrate (LOPRESSOR) tablet 100 mg   lactated ringers infusion   methylPREDNISolone sodium succinate (SOLU-MEDROL) 40 mg/mL injection 40 mg    -I have reviewed the patients home medicines and have made adjustments as needed   Consultations Obtained: na   Cardiac Monitoring: The patient was maintained on a cardiac monitor.  I personally viewed and interpreted the cardiac monitored which showed an underlying rhythm of: AFIB RVR  Social Determinants of Health:  Diagnosis or treatment significantly limited by social determinants of health: former smoker   Reevaluation: After the interventions noted above, I reevaluated the patient and found that they have improved  Co morbidities that complicate the patient evaluation  Past Medical History:  Diagnosis Date   Anxiety    Atrial fibrillation (HCC)    newly diagnosed 10/2010   Family history of adverse reaction to anesthesia    Sister's BP drops    Hip fracture (HCC) 01/18/2022   Hypertension    Rheumatoid arthritis(714.0)    Subclinical hyperthyroidism       Dispostion: Disposition decision including need for hospitalization was considered, and patient disposition pending at time of sign out.    Final Clinical Impression(s) / ED Diagnoses Final diagnoses:  Acute pancreatitis, unspecified complication status, unspecified pancreatitis type  Pneumonia of both lungs due to infectious organism, unspecified part of lung  Sepsis, due to  unspecified organism, unspecified whether acute organ dysfunction present [facility])  Atrial fibrillation, unspecified type (HCC)  Hyponatremia        Sloan Leiter, DO 03/11/23 2015

## 2023-03-10 NOTE — ED Notes (Signed)
Patient transported to CT 

## 2023-03-10 NOTE — ED Triage Notes (Signed)
Pt arrives to ED with c/o fever x2 days. Pt noted to be in Afib RVR and hypotensive in triage.

## 2023-03-10 NOTE — Progress Notes (Signed)
[facility] is accepted [facility].  Patient has presented to outside ER with complaints of 2 to 3 days of fever.  Workup is positive for SIRS with soft blood pressures but MAP over 65.  No lactic acid elevation.  Focus is felt to be multifocal pneumonia that is seen on CAT scan.  Incidental findings include dilation of pancreatic duct, but lipase is negative and no reading of pancreatitis on CAT scan.  Similarly there are some gallbladder thickening.  Outside facility does not have ability to get an ultrasound.  LFTs are not markedly abnormal, slight elevation in ALP felt to be nonspecific by the author.  Patient is being maintained on room air. Note mdoerate hyponatremia. Felt to be asymptaomtic, chronic. ??prerenal/hypovolvemic.  Labs on Admission:  Results for orders placed or performed during the hospital encounter of 03/10/23 (from the past 24 hour(s))  Lactic acid, plasma     Status: None   Collection Time: 03/10/23  1:30 PM  Result Value Ref Range   Lactic Acid, Venous 1.3 0.5 - 1.9 mmol/L  Comprehensive metabolic panel     Status: Abnormal   Collection Time: 03/10/23  1:30 PM  Result Value Ref Range   Sodium 125 (L) 135 - 145 mmol/L   Potassium 4.8 3.5 - 5.1 mmol/L   Chloride 96 (L) 98 - 111 mmol/L   CO2 18 (L) 22 - 32 mmol/L   Glucose, Bld 106 (H) 70 - 99 mg/dL   BUN 21 8 - 23 mg/dL   Creatinine, Ser 7.42 (H) 0.44 - 1.00 mg/dL   Calcium 9.9 8.9 - 59.5 mg/dL   Total Protein 7.1 6.5 - 8.1 g/dL   Albumin 3.7 3.5 - 5.0 g/dL   AST 26 15 - 41 U/L   ALT 17 0 - 44 U/L   Alkaline Phosphatase 238 (H) 38 - 126 U/L   Total Bilirubin 0.6 0.3 - 1.2 mg/dL   GFR, Estimated 44 (L) >60 mL/min   Anion gap 11 5 - 15  CBC with Differential     Status: Abnormal   Collection Time: 03/10/23  1:30 PM  Result Value Ref Range   WBC 11.7 (H) 4.0 - 10.5 K/uL   RBC 3.79 (L) 3.87 - 5.11 MIL/uL   Hemoglobin 12.2 12.0 - 15.0 g/dL   HCT 63.8 (L) 75.6 - 43.3 %   MCV 93.7 80.0 - 100.0 fL   MCH 32.2  26.0 - 34.0 pg   MCHC 34.4 30.0 - 36.0 g/dL   RDW 29.5 18.8 - 41.6 %   Platelets 188 150 - 400 K/uL   nRBC 0.0 0.0 - 0.2 %   Neutrophils Relative % 86 %   Neutro Abs 10.1 (H) 1.7 - 7.7 K/uL   Lymphocytes Relative 7 %   Lymphs Abs 0.8 0.7 - 4.0 K/uL   Monocytes Relative 6 %   Monocytes Absolute 0.7 0.1 - 1.0 K/uL   Eosinophils Relative 0 %   Eosinophils Absolute 0.0 0.0 - 0.5 K/uL   Basophils Relative 0 %   Basophils Absolute 0.0 0.0 - 0.1 K/uL   Immature Granulocytes 1 %   Abs Immature Granulocytes 0.06 0.00 - 0.07 K/uL  Protime-INR     Status: Abnormal   Collection Time: 03/10/23  1:30 PM  Result Value Ref Range   Prothrombin Time 18.2 (H) 11.4 - 15.2 seconds   INR 1.5 (H) 0.8 - 1.2  APTT     Status: Abnormal   Collection Time: 03/10/23  1:30 PM  Result Value Ref Range   aPTT 44 (H) 24 - 36 seconds  Blood Culture (routine x 2)     Status: None (Preliminary result)   Collection Time: 03/10/23  1:30 PM   Specimen: BLOOD LEFT WRIST  Result Value Ref Range   Specimen Description      BLOOD LEFT WRIST Performed at [facility], [address] [place], [place] [address]    Special Requests      Blood Culture adequate volume BOTTLES DRAWN AEROBIC AND ANAEROBIC Performed at [facility], [address], [place], [place] [address]    Culture PENDING    Report Status PENDING   TSH     Status: None   Collection Time: 03/10/23  1:30 PM  Result Value Ref Range   TSH 1.089 0.350 - 4.500 uIU/mL  Lipase, blood     Status: None   Collection Time: 03/10/23  1:30 PM  Result Value Ref Range   Lipase 21 11 - 51 U/L  Resp panel by RT-PCR (RSV, Flu A&B, Covid) Anterior Nasal Swab     Status: None   Collection Time: 03/10/23  1:39 PM   Specimen: Anterior Nasal Swab  Result Value Ref Range   SARS Coronavirus 2 by RT PCR NEGATIVE NEGATIVE   Influenza A by PCR NEGATIVE NEGATIVE   Influenza B by PCR NEGATIVE NEGATIVE   Resp Syncytial Virus by PCR NEGATIVE  NEGATIVE   Basic Metabolic Panel: Recent Labs  Lab 03/10/23 1330  NA 125*  K 4.8  CL 96*  CO2 18*  GLUCOSE 106*  BUN 21  CREATININE 1.28*  CALCIUM 9.9   Liver Function Tests: Recent Labs  Lab 03/10/23 1330  AST 26  ALT 17  ALKPHOS 238*  BILITOT 0.6  PROT 7.1  ALBUMIN 3.7   Recent Labs  Lab 03/10/23 1330  LIPASE 21   No results for input(s): "AMMONIA" in the last 168 hours. CBC: Recent Labs  Lab 03/10/23 1330  WBC 11.7*  NEUTROABS 10.1*  HGB 12.2  HCT 35.5*  MCV 93.7  PLT 188   Cardiac Enzymes: No results for input(s): "CKTOTAL", "CKMB", "CKMBINDEX", "TROPONINIHS" in the last 168 hours.  BNP (last 3 results) No results for input(s): "PROBNP" in the last 8760 hours. CBG: No results for input(s): "GLUCAP" in the last 168 hours.  Radiological Exams on Admission:  CT CHEST ABDOMEN PELVIS W CONTRAST  Result Date: 03/10/2023 CLINICAL DATA:  Sepsis. EXAM: CT CHEST, ABDOMEN, AND PELVIS WITH CONTRAST TECHNIQUE: Multidetector CT imaging of the chest, abdomen and pelvis was performed following the standard protocol during bolus administration of intravenous contrast. RADIATION DOSE REDUCTION: This exam was performed according to the departmental dose-optimization program which includes automated exposure control, adjustment of the mA and/or kV according to patient size and/or use of iterative reconstruction technique. CONTRAST:  85mL OMNIPAQUE IOHEXOL 350 MG/ML SOLN COMPARISON:  Chest radiograph 03/10/2023.  CT abdomen 10/14/2019 FINDINGS: CT CHEST FINDINGS Cardiovascular: Mild cardiac enlargement. No pericardial effusions. Normal caliber thoracic aorta. No dissection. Calcification of the aorta and coronary arteries. 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 Esophagus is decompressed. Scattered lymph nodes are not pathologically enlarged. Lungs/Pleura: Patchy and confluent airspace infiltrates throughout both  lungs. Changes likely to represent multifocal pneumonia. No pleural effusions. No pneumothorax. Airways are patent. Musculoskeletal: No chest wall mass or suspicious bone lesions identified. CT ABDOMEN PELVIS FINDINGS Hepatobiliary: Mild diffuse fatty infiltration of the liver. No focal lesions. Gallbladder wall is thickened  and edematous. No stones identified. Changes could indicate cholecystitis or may be related to liver disease. Consider correlation with ultrasound. Pancreas: Diffuse pancreatic ductal dilatation. Mild parenchymal atrophy. No obstructing stone or lesion identified. No peripancreatic edema or collections. Changes could be due to early acute or chronic pancreatitis or to an occult obstructing lesion or stone. Consider MRCP for further evaluation. Spleen: Normal in size without focal abnormality. Adrenals/Urinary Tract: No adrenal gland nodules. Kidneys are symmetrical. No hydronephrosis or hydroureter. Bladder is normal. Stomach/Bowel: Stomach, small bowel, and colon are not abnormally distended. No wall thickening or inflammatory changes. Stool throughout the colon. Colonic diverticulosis without evidence of acute diverticulitis. Appendix is normal. Vascular/Lymphatic: Aortic atherosclerosis. No enlarged abdominal or pelvic lymph nodes. Reproductive: Uterus and bilateral adnexa are unremarkable. Other: No abdominal wall hernia or abnormality. No abdominopelvic ascites. Musculoskeletal: Postoperative right hip hemiarthroplasty. Degenerative changes in the spine. IMPRESSION: 1. Patchy infiltrates throughout the lungs likely representing multifocal pneumonia. 2. Incidental heterogeneous and enlarged thyroid. Recommend non-emergent thyroid ultrasound. Reference: J Am Coll Radiol. 2015 Feb;12(2): 143-50 3. Nonspecific gallbladder wall thickening and edema. No stones. Consider ultrasound for further evaluation. 4. Diffuse pancreatic ductal dilatation with evidence of pancreatic atrophy. This could  represent early acute or chronic pancreatitis but an occult obstructing lesion is not excluded. Consider follow-up with elective MRCP. 5. Aortic atherosclerosis. 6. Cardiac enlargement. Electronically Signed   By: Burman Nieves M.D.   On: 03/10/2023 18:23   DG Chest Port 1 View  Result Date: 03/10/2023 CLINICAL DATA:  Fever for 2 days.  Possible sepsis. EXAM: PORTABLE CHEST 1 VIEW COMPARISON:  01/18/2022 FINDINGS: Midline trachea. Mild cardiomegaly. Atherosclerosis in the transverse aorta. No pleural effusion or pneumothorax. Moderate pulmonary interstitial thickening is felt to be slightly increased. No lobar consolidation. IMPRESSION: Moderate pulmonary interstitial thickening, felt to be slightly increased. This could be related to smoking/chronic bronchitis (ex-smoker quitting 3 years ago). However, viral or atypical/mycoplasma pneumonia could look similar. Aortic Atherosclerosis (ICD10-I70.0). Electronically Signed   By: Jeronimo Greaves M.D.   On: 03/10/2023 15:09      Today's Vitals   03/10/23 1730 03/10/23 1730 03/10/23 1800 03/10/23 1830  BP: 91/73  (!) 86/69 (!) 85/74  Pulse: 95  93 71  Resp: 19  13 14   Temp:  98 F (36.7 C)    TempSrc:  Oral    SpO2: 95%  95% 96%  Weight:      Height:      PainSc:       Body mass index is 21.54 kg/m.

## 2023-03-11 ENCOUNTER — Inpatient Hospital Stay (HOSPITAL_COMMUNITY): Payer: 59

## 2023-03-11 ENCOUNTER — Other Ambulatory Visit: Payer: Self-pay

## 2023-03-11 DIAGNOSIS — A419 Sepsis, unspecified organism: Secondary | ICD-10-CM

## 2023-03-11 DIAGNOSIS — I5022 Chronic systolic (congestive) heart failure: Secondary | ICD-10-CM | POA: Diagnosis not present

## 2023-03-11 DIAGNOSIS — E0590 Thyrotoxicosis, unspecified without thyrotoxic crisis or storm: Secondary | ICD-10-CM

## 2023-03-11 DIAGNOSIS — E872 Acidosis, unspecified: Secondary | ICD-10-CM

## 2023-03-11 DIAGNOSIS — I4811 Longstanding persistent atrial fibrillation: Secondary | ICD-10-CM

## 2023-03-11 DIAGNOSIS — E861 Hypovolemia: Secondary | ICD-10-CM

## 2023-03-11 DIAGNOSIS — E871 Hypo-osmolality and hyponatremia: Secondary | ICD-10-CM

## 2023-03-11 DIAGNOSIS — N179 Acute kidney failure, unspecified: Secondary | ICD-10-CM

## 2023-03-11 LAB — PROCALCITONIN: Procalcitonin: 1.59 ng/mL

## 2023-03-11 LAB — COMPREHENSIVE METABOLIC PANEL
ALT: 29 U/L (ref 0–44)
AST: 43 U/L — ABNORMAL HIGH (ref 15–41)
Albumin: 2.8 g/dL — ABNORMAL LOW (ref 3.5–5.0)
Alkaline Phosphatase: 246 U/L — ABNORMAL HIGH (ref 38–126)
Anion gap: 16 — ABNORMAL HIGH (ref 5–15)
BUN: 17 mg/dL (ref 8–23)
CO2: 21 mmol/L — ABNORMAL LOW (ref 22–32)
Calcium: 9.5 mg/dL (ref 8.9–10.3)
Chloride: 97 mmol/L — ABNORMAL LOW (ref 98–111)
Creatinine, Ser: 1.37 mg/dL — ABNORMAL HIGH (ref 0.44–1.00)
GFR, Estimated: 41 mL/min — ABNORMAL LOW (ref >60)
Glucose, Bld: 102 mg/dL — ABNORMAL HIGH (ref 70–99)
Potassium: 4.3 mmol/L (ref 3.5–5.1)
Sodium: 134 mmol/L — ABNORMAL LOW (ref 135–145)
Total Bilirubin: 0.6 mg/dL (ref 0.3–1.2)
Total Protein: 6.5 g/dL (ref 6.5–8.1)

## 2023-03-11 LAB — CBC WITH DIFFERENTIAL/PLATELET
Abs Immature Granulocytes: 0.07 10*3/uL (ref 0.00–0.07)
Basophils Absolute: 0 10*3/uL (ref 0.0–0.1)
Basophils Relative: 0 %
Eosinophils Absolute: 0.1 10*3/uL (ref 0.0–0.5)
Eosinophils Relative: 1 %
HCT: 35.6 % — ABNORMAL LOW (ref 36.0–46.0)
Hemoglobin: 11.7 g/dL — ABNORMAL LOW (ref 12.0–15.0)
Immature Granulocytes: 1 %
Lymphocytes Relative: 15 %
Lymphs Abs: 1.5 10*3/uL (ref 0.7–4.0)
MCH: 31 pg (ref 26.0–34.0)
MCHC: 32.9 g/dL (ref 30.0–36.0)
MCV: 94.4 fL (ref 80.0–100.0)
Monocytes Absolute: 1 10*3/uL (ref 0.1–1.0)
Monocytes Relative: 9 %
Neutro Abs: 7.5 10*3/uL (ref 1.7–7.7)
Neutrophils Relative %: 74 %
Platelets: 197 10*3/uL (ref 150–400)
RBC: 3.77 MIL/uL — ABNORMAL LOW (ref 3.87–5.11)
RDW: 14.6 % (ref 11.5–15.5)
WBC: 10.2 10*3/uL (ref 4.0–10.5)
nRBC: 0 % (ref 0.0–0.2)

## 2023-03-11 LAB — BRAIN NATRIURETIC PEPTIDE: B Natriuretic Peptide: 2232.7 pg/mL — ABNORMAL HIGH (ref 0.0–100.0)

## 2023-03-11 LAB — MAGNESIUM: Magnesium: 1.9 mg/dL (ref 1.7–2.4)

## 2023-03-11 LAB — URINE CULTURE: Culture: <10000 — AB

## 2023-03-11 LAB — C-REACTIVE PROTEIN: CRP: 21.9 mg/dL — ABNORMAL HIGH (ref <1.0)

## 2023-03-11 LAB — BETA-HYDROXYBUTYRIC ACID: Beta-Hydroxybutyric Acid: 0.93 mmol/L — ABNORMAL HIGH (ref 0.05–0.27)

## 2023-03-11 LAB — T4, FREE: Free T4: 0.93 ng/dL (ref 0.61–1.12)

## 2023-03-11 LAB — TSH: TSH: 0.912 u[IU]/mL (ref 0.350–4.500)

## 2023-03-11 LAB — OSMOLALITY: Osmolality: 283 mOsm/kg (ref 275–295)

## 2023-03-11 LAB — URIC ACID: Uric Acid, Serum: 3.7 mg/dL (ref 2.5–7.1)

## 2023-03-11 MED ORDER — LACTATED RINGERS IV SOLN: INTRAVENOUS | Status: DC

## 2023-03-11 MED ORDER — METOPROLOL TARTRATE 100 MG PO TABS
100.0000 mg | ORAL_TABLET | Freq: Two times a day (BID) | ORAL | Status: DC
  Administered 2023-03-11 – 2023-03-15 (×8): 100 mg via ORAL
  Filled 2023-03-11 (×9): qty 1

## 2023-03-11 MED ORDER — ACETAMINOPHEN 325 MG PO TABS: 325.0000 mg | ORAL_TABLET | Freq: Four times a day (QID) | ORAL | Status: DC | PRN

## 2023-03-11 MED ORDER — AZITHROMYCIN 500 MG PO TABS
500.0000 mg | ORAL_TABLET | Freq: Every day | ORAL | Status: AC
  Administered 2023-03-11 – 2023-03-15 (×5): 500 mg via ORAL
  Filled 2023-03-11 (×5): qty 1

## 2023-03-11 MED ORDER — ALPRAZOLAM 0.5 MG PO TABS
1.0000 mg | ORAL_TABLET | Freq: Every evening | ORAL | Status: DC | PRN
  Administered 2023-03-11 – 2023-03-13 (×3): 1 mg via ORAL
  Filled 2023-03-11 (×4): qty 2

## 2023-03-11 MED ORDER — DILTIAZEM HCL 25 MG/5ML IV SOLN
10.0000 mg | Freq: Four times a day (QID) | INTRAVENOUS | Status: DC | PRN
  Administered 2023-03-11: 10 mg via INTRAVENOUS
  Filled 2023-03-11: qty 5

## 2023-03-11 MED ORDER — DIGOXIN 0.25 MG/ML IJ SOLN
0.2500 mg | Freq: Four times a day (QID) | INTRAMUSCULAR | Status: AC
  Administered 2023-03-11 (×2): 0.25 mg via INTRAVENOUS
  Filled 2023-03-11 (×2): qty 1

## 2023-03-11 MED ORDER — METHYLPREDNISOLONE SODIUM SUCC 40 MG IJ SOLR
40.0000 mg | INTRAMUSCULAR | Status: DC
  Administered 2023-03-11 – 2023-03-13 (×3): 40 mg via INTRAVENOUS
  Filled 2023-03-11 (×3): qty 1

## 2023-03-11 MED ORDER — SODIUM CHLORIDE 0.9 % IV SOLN
1.0000 g | INTRAVENOUS | Status: AC
  Administered 2023-03-11 – 2023-03-13 (×3): 1 g via INTRAVENOUS
  Filled 2023-03-11 (×3): qty 10

## 2023-03-11 MED ORDER — METHIMAZOLE 5 MG PO TABS
5.0000 mg | ORAL_TABLET | Freq: Every day | ORAL | Status: DC
  Administered 2023-03-11 – 2023-03-15 (×5): 5 mg via ORAL
  Filled 2023-03-11 (×5): qty 1

## 2023-03-11 MED ORDER — METOPROLOL TARTRATE 50 MG PO TABS
50.0000 mg | ORAL_TABLET | Freq: Once | ORAL | Status: AC
  Administered 2023-03-11: 50 mg via ORAL
  Filled 2023-03-11: qty 1

## 2023-03-11 MED ORDER — APIXABAN 5 MG PO TABS
5.0000 mg | ORAL_TABLET | Freq: Two times a day (BID) | ORAL | Status: DC
  Administered 2023-03-11 – 2023-03-15 (×10): 5 mg via ORAL
  Filled 2023-03-11 (×10): qty 1

## 2023-03-11 MED ORDER — METOPROLOL TARTRATE 50 MG PO TABS
50.0000 mg | ORAL_TABLET | Freq: Two times a day (BID) | ORAL | Status: DC
  Administered 2023-03-11: 50 mg via ORAL
  Filled 2023-03-11: qty 1

## 2023-03-11 MED ORDER — LACTATED RINGERS IV BOLUS
500.0000 mL | Freq: Once | INTRAVENOUS | Status: AC
  Administered 2023-03-11: 500 mL via INTRAVENOUS

## 2023-03-11 MED ORDER — LACTATED RINGERS IV SOLN: INTRAVENOUS | Status: AC

## 2023-03-11 NOTE — Plan of Care (Signed)

## 2023-03-11 NOTE — Progress Notes (Signed)
PT Cancellation Note  Patient Details Name: Rhonda Marshall MRN: [id number] DOB: [birth year]   Cancelled Treatment:    Reason Eval/Treat Not Completed: Patient not medically ready  Resting HR fluctuating 130-150. Noted up to 165 with sitting EOB with OT. Will defer PT evaluation at this time. Will reattempt 8/29.   Jerolyn Center, PT Acute Rehabilitation Services  Office [phone]  Rhonda Marshall 03/11/2023, 11:10 AM

## 2023-03-11 NOTE — Assessment & Plan Note (Signed)
-  secondary to pneumonia or UTI  -pt is a limited historian but CT Chest showed possible pneumonia and UA was grossly positive -she is immunosuppressed on Plaquenil for her RA. Continue IV Vancomycin and Cefepime pending urine culture to see if antibiotics can be narrowed.  -pt has received 30cc/kg bolus. Will d/c now as her BP has improved and she has heart failure with EF 30%

## 2023-03-11 NOTE — Evaluation (Signed)
Clinical/Bedside Swallow Evaluation Patient Details  Name: Rhonda Marshall MRN: [id number] Date of Birth: 1951-02-26  Today's Date: 03/11/2023 Time: SLP Start Time (ACUTE ONLY): 0848 SLP Stop Time (ACUTE ONLY): 0859 SLP Time Calculation (min) (ACUTE ONLY): 11 min  Past Medical History:  Past Medical History:  Diagnosis Date   Anxiety    Atrial fibrillation (HCC)    newly diagnosed 10/2010   Family history of adverse reaction to anesthesia    Sister's BP drops    Hip fracture (HCC) 01/18/2022   Hypertension    Rheumatoid arthritis(714.0)    Subclinical hyperthyroidism    Past Surgical History:  Past Surgical History:  Procedure Laterality Date   Arm surgery     fracture, right arm   CARDIOVERSION N/A 08/10/2014   Procedure: CARDIOVERSION;  Surgeon: Lars Masson, MD;  Location: [facility] ENDOSCOPY;  Service: Cardiovascular;  Laterality: N/A;   CARDIOVERSION N/A 01/22/2022   Procedure: CARDIOVERSION;  Surgeon: Meriam Sprague, MD;  Location: [facility] ENDOSCOPY;  Service: Cardiovascular;  Laterality: N/A;   CATARACT EXTRACTION     DIRECT LARYNGOSCOPY N/A 09/02/2019   Procedure: DIRECT LARYNGOSCOPY WITH BIOPSY;  Surgeon: Drema Halon, MD;  Location: [facility];  Service: ENT;  Laterality: N/A;   HIP ARTHROPLASTY Right 01/19/2022   Procedure: ARTHROPLASTY BIPOLAR HIP (HEMIARTHROPLASTY);  Surgeon: Teryl Lucy, MD;  Location: [facility] ORS;  Service: Orthopedics;  Laterality: Right;   IR GASTROSTOMY TUBE MOD SED  10/17/2019   IR GASTROSTOMY TUBE REMOVAL  01/03/2020   TEE WITHOUT CARDIOVERSION N/A 01/22/2022   Procedure: TRANSESOPHAGEAL ECHOCARDIOGRAM (TEE);  Surgeon: Meriam Sprague, MD;  Location: [facility] ENDOSCOPY;  Service: Cardiovascular;  Laterality: N/A;   TUBAL LIGATION     HPI:  Rhonda Marshall is a 72 y.o. female with medical history significant of persistent atrial fibrillation on Eliquis, chronic systolic heart failure, LBBB, right carotid artery stenosis,  laryngeal cancer, hyperparathyroidism, rheumatoid arthritis on immunosuppressive's, anxiety who presents with fever and weakness. CT chest/abdomen/pelvis with findings of airspace infiltrate throughout both lungs possibly representing multifocal pneumonia. Per chart there is also gallbladder wall thickening and edema possibly indicating cholecystitis.  There was diffuse pancreatic ductal dilatation with findings of possible pancreatitis. BSE 01/20/22 reg/thin recommended.    Assessment / Plan / Recommendation  Clinical Impression  Pt exhibits an oral dysphagia and states she has difficulty masticating foods that are hard. With the solid texture she had right sided residue that she removed several times with her finger. She had laryngeal cancer 3-4 years ago but denies pharyngeal swallow difficulty. She did have immediate cough initially with thin liquids that was not seen at other times throughout assessment. Pt reports losing weight over past 7 months due to mastication. Pt states she cuts her meats at home. Recommend downgrade to Dys 3, continue thin liquids, pills with liquid. No further ST needed at this time. SLP Visit Diagnosis: Dysphagia, unspecified (R13.10)    Aspiration Risk  Mild aspiration risk    Diet Recommendation Dysphagia 3 (Mech soft);Thin liquid    Liquid Administration via: Cup;Straw Medication Administration: Whole meds with liquid Supervision: Patient able to self feed Compensations: Small sips/bites;Slow rate Postural Changes: Seated upright at 90 degrees    Other  Recommendations Oral Care Recommendations: Oral care BID    Recommendations for follow up therapy are one component of a multi-disciplinary discharge planning process, led by the attending physician.  Recommendations may be updated based on patient status, additional functional criteria and insurance authorization.  Follow up Recommendations  No SLP follow up      Assistance Recommended at Discharge     Functional Status Assessment Patient has not had a recent decline in their functional status  Frequency and Duration            Prognosis        Swallow Study   General Date of Onset: 03/10/23 HPI: Rhonda Marshall is a 72 y.o. female with medical history significant of persistent atrial fibrillation on Eliquis, chronic systolic heart failure, LBBB, right carotid artery stenosis, laryngeal cancer, hyperparathyroidism, rheumatoid arthritis on immunosuppressive's, anxiety who presents with fever and weakness. CT chest/abdomen/pelvis with findings of airspace infiltrate throughout both lungs possibly representing multifocal pneumonia. Per chart there is also gallbladder wall thickening and edema possibly indicating cholecystitis.  There was diffuse pancreatic ductal dilatation with findings of possible pancreatitis. BSE 01/20/22 reg/thin recommended. Type of Study: Bedside Swallow Evaluation Previous Swallow Assessment:  (see HPI) Diet Prior to this Study: Regular;Thin liquids (Level 0) Temperature Spikes Noted: No Respiratory Status: Nasal cannula History of Recent Intubation: No Behavior/Cognition: Alert;Cooperative;Pleasant mood Oral Cavity Assessment: Dry Oral Care Completed by SLP: No Oral Cavity - Dentition: Dentures, top;Dentures, bottom Vision: Functional for self-feeding Self-Feeding Abilities: Able to feed self Patient Positioning: Upright in bed Baseline Vocal Quality: Normal Volitional Cough: Strong Volitional Swallow: Able to elicit    Oral/Motor/Sensory Function Overall Oral Motor/Sensory Function: Within functional limits   Ice Chips Ice chips: Not tested   Thin Liquid Thin Liquid: Impaired Presentation: Straw Pharyngeal  Phase Impairments: Cough - Immediate    Nectar Thick Nectar Thick Liquid: Not tested   Honey Thick Honey Thick Liquid: Not tested   Puree Puree: Within functional limits   Solid     Solid: Impaired Oral Phase Functional Implications: Oral  residue Pharyngeal Phase Impairments:  (none)      Rhonda Marshall, [name] 03/11/2023,9:13 AM

## 2023-03-11 NOTE — H&P (Signed)
History and Physical    Patient: Rhonda Marshall ZOX:[id number] DOB: 06-17-1951 DOA: 03/10/2023 DOS: the patient was seen and examined on 03/11/2023 PCP: Knox Royalty, MD  Patient coming from:  [facility]  Chief Complaint:  Chief Complaint  Patient presents with   Fever   HPI: Rhonda Marshall is a 72 y.o. female with medical history significant of persistent atrial fibrillation on Eliquis, chronic systolic heart failure, LBBB, right carotid artery stenosis, laryngeal cancer, hyperparathyroidism, rheumatoid arthritis on immunosuppressive's, anxiety who presents with fever and weakness.   Pt oriented to self, place and time but was a limited and unreliable historian. RN met with family earlier and helps with history. About 5 days ago pt had increasing weakness, fever up to 104F and a new cough. She felt better the next day and went to baby shower. Then after that again was weakness with fever. Pt also feels some shortness of breath. Had some lower abdominal pain but denies dysuria. No nausea, vomiting or diarrhea.   In the ED, she presented with fever of 101.4, tachycardia with heart rate of 120 and was hypotensive down to 70s over 50s on room air.  CBC with leukocytosis of 11.7, no anemia with hemoglobin of 12.  Lactate within normal limits.  BMP with hyponatremia 125, K of 4.8, hypochloremia of 96, CO2 of 18, AKI with creatinine of 1.28, normal anion gap.  Normal TSH.  Negative influenza, RSV, COVID PCR.  UA with large leukocyte, positive nitrite and WBCs.  CT chest/abdomen/pelvis with findings of airspace infiltrate throughout both lungs possibly representing multifocal pneumonia.  There is also gallbladder wall thickening and edema possibly indicating cholecystitis.  There was diffuse pancreatic ductal dilatation with findings of possible pancreatitis.  Lipase is within normal limits.  Patient was given sepsis IV fluid resuscitation and started on broad-spectrum antibiotics  with IV vancomycin, cefepime and Flagyl in the ED.  She was then transferred here to [facility] for further hospitalist management. Review of Systems: As mentioned in the history of present illness. All other systems reviewed and are negative. Past Medical History:  Diagnosis Date   Anxiety    Atrial fibrillation (HCC)    newly diagnosed 10/2010   Family history of adverse reaction to anesthesia    Sister's BP drops    Hip fracture (HCC) 01/18/2022   Hypertension    Rheumatoid arthritis(714.0)    Subclinical hyperthyroidism    Past Surgical History:  Procedure Laterality Date   Arm surgery     fracture, right arm   CARDIOVERSION N/A 08/10/2014   Procedure: CARDIOVERSION;  Surgeon: Lars Masson, MD;  Location: [facility] ENDOSCOPY;  Service: Cardiovascular;  Laterality: N/A;   CARDIOVERSION N/A 01/22/2022   Procedure: CARDIOVERSION;  Surgeon: Meriam Sprague, MD;  Location: [facility] ENDOSCOPY;  Service: Cardiovascular;  Laterality: N/A;   CATARACT EXTRACTION     DIRECT LARYNGOSCOPY N/A 09/02/2019   Procedure: DIRECT LARYNGOSCOPY WITH BIOPSY;  Surgeon: Drema Halon, MD;  Location: [facility];  Service: ENT;  Laterality: N/A;   HIP ARTHROPLASTY Right 01/19/2022   Procedure: ARTHROPLASTY BIPOLAR HIP (HEMIARTHROPLASTY);  Surgeon: Teryl Lucy, MD;  Location: [facility] ORS;  Service: Orthopedics;  Laterality: Right;   IR GASTROSTOMY TUBE MOD SED  10/17/2019   IR GASTROSTOMY TUBE REMOVAL  01/03/2020   TEE WITHOUT CARDIOVERSION N/A 01/22/2022   Procedure: TRANSESOPHAGEAL ECHOCARDIOGRAM (TEE);  Surgeon: Meriam Sprague, MD;  Location: [facility] ENDOSCOPY;  Service: Cardiovascular;  Laterality: N/A;   TUBAL LIGATION  Social History:  reports that she quit smoking about 3 years ago. Her smoking use included cigarettes. She started smoking about 57 years ago. She has a 13.6 pack-year smoking history. She has been exposed to tobacco smoke. She has never used smokeless tobacco. She reports  that she does not currently use alcohol. She reports that she does not use drugs.  Allergies  Allergen Reactions   Clindamycin/Lincomycin Rash   Latex Rash    Latex gloves    Family History  Problem Relation Age of Onset   Heart attack Mother 18       pacemaker age 47   Hypothyroidism Mother    Cancer Father        colon and prostate   Hypothyroidism Sister    Cancer Sister    Diabetes Brother    CAD Brother 24   Cancer Brother        Renal    Prior to Admission medications   Medication Sig Start Date End Date Taking? Authorizing Provider  acetaminophen (TYLENOL) 325 MG tablet Take 1-2 tablets (325-650 mg total) by mouth every 6 (six) hours as needed for mild pain (pain score 1-3 or temp > 100.5). 01/25/22  Yes Dahal, Melina Schools, MD  ALPRAZolam Prudy Feeler) 1 MG tablet Take 1 mg by mouth at bedtime as needed for anxiety.   Yes [provider]  ELIQUIS 5 MG TABS tablet Take 1 tablet (5 mg total) by mouth 2 (two) times daily. 12/22/22  Yes Rollene Rotunda, MD  empagliflozin (JARDIANCE) 10 MG TABS tablet Take 1 tablet (10 mg total) by mouth daily. 12/17/22  Yes Rollene Rotunda, MD  ENTRESTO 24-26 MG Take 1 tablet by mouth 2 (two) times daily. 10/09/22  Yes Rollene Rotunda, MD  hydroxychloroquine (PLAQUENIL) 200 MG tablet Take 200 mg by mouth daily. 03/07/23  Yes [provider]  methimazole (TAPAZOLE) 5 MG tablet Take 5 mg by mouth daily. 03/04/23  Yes [provider]  metoprolol succinate (TOPROL-XL) 100 MG 24 hr tablet Take 1 tablet (100 mg total) by mouth 2 (two) times daily. Take with or immediately following a meal. 12/10/22  Yes Hochrein, Fayrene Fearing, MD  Multiple Vitamin (MULTIVITAMIN WITH MINERALS) TABS tablet Take 1 tablet by mouth daily.    Yes [provider]  oxyCODONE-acetaminophen (PERCOCET) 10-325 MG tablet Take 1 tablet by mouth every 4 (four) hours as needed for pain. 01/24/22  Yes Janine Ores K, PA-C  Vitamin D, Ergocalciferol, (DRISDOL) 1.25 MG (50000  UNIT) CAPS capsule Take 50,000 Units by mouth once a week. 12/28/21  Yes [provider]    Physical Exam: Vitals:   03/10/23 2000 03/10/23 2100 03/10/23 2200 03/11/23 0000  BP:  101/67  110/85  Pulse: 89 93 86 (!) 111  Resp: (!) 25 (!) 23 14 16   Temp:  (!) 97.3 F (36.3 C)  97.7 F (36.5 C)  TempSrc:  Oral  Oral  SpO2: 100% 97% 98% 97%  Weight:      Height:       Constitutional: NAD, calm, comfortable, elderly thin female laying in bed Eyes: lids and conjunctivae normal ENMT: Mucous membranes are moist.  Neck: normal, supple Respiratory: clear to auscultation bilaterally, no wheezing, no crackles. Normal respiratory effort. No accessory muscle use. On room air Cardiovascular: Regular rate and rhythm, no murmurs / rubs / gallops. No extremity edema. Abdomen: no tenderness,soft Musculoskeletal: no clubbing / cyanosis. No joint deformity upper and lower extremities. Good ROM, no contractures. Normal muscle tone.  Skin: no  rashes, lesions, ulcers. No induration Neurologic: CN 2-12 grossly intact. Alert and oriented to self, time and place but not able to provide consistent history  Psychiatric: Alert and oriented x 3. Normal mood.   Data Reviewed:  See HPI  Assessment and Plan: * Sepsis (HCC) -secondary to pneumonia or UTI  -pt is a limited historian but CT Chest showed possible pneumonia and UA was grossly positive -she is immunosuppressed on Plaquenil for her RA. Continue IV Vancomycin and Cefepime pending urine culture to see if antibiotics can be narrowed.  -pt has received 30cc/kg bolus. Will d/c now as her BP has improved and she has heart failure with EF 30%  Hyponatremia - Mild.  Secondary to hypovolemic hyponatremia.  Follow trend with IV fluids.  Metabolic acidosis Secondary to hypotension and worsening renal function - Continue IV fluid hydration  AKI (acute kidney injury) (HCC) -Creatinine elevated to 1.28 from prior of 1 secondary to  dehydration/hypotension - Continue IV fluids - Hold home Entresto  Hypotension - Per outpatient cardiology patient at baseline has low blood pressure at baseline around 90s over 60s.  She has been intolerant to titration of her medications in the past. -BP now improved up to SBP 110 -will d/c fluid to avoid overload -home antihypertensives overnight  Hyperthyroidism - Continue methimazole  Chronic systolic CHF (congestive heart failure) (HCC) Last echocardiogram in July 2024 with EF of 30 to 35%, global hypokinesis and no significant valvular abnormalities which is unchanged from prior. -stopping continuous IV fluid now with improved BP to avoid fluid overload  Longstanding persistent atrial fibrillation (HCC) - Continue Eliquis - Holding beta-blocker for now due to hypotension      Advance Care Planning:   Code Status: Full Code   Consults: none  Family Communication: none at bedside  Severity of Illness: The appropriate patient status for this patient is INPATIENT. Inpatient status is judged to be reasonable and necessary in order to provide the required intensity of service to ensure the patient's safety. The patient's presenting symptoms, physical exam findings, and initial radiographic and laboratory data in the context of their chronic comorbidities is felt to place them at high risk for further clinical deterioration. Furthermore, it is not anticipated that the patient will be medically stable for discharge from the hospital within 2 midnights of admission.   * I certify that at the point of admission it is my clinical judgment that the patient will require inpatient hospital care spanning beyond 2 midnights from the point of admission due to high intensity of service, high risk for further deterioration and high frequency of surveillance required.*  Author: Anselm Jungling, DO 03/11/2023 1:37 AM  For on call review [email or web address].

## 2023-03-11 NOTE — Progress Notes (Signed)
MEWS Progress Note  Patient Details Name: Rhonda Marshall MRN: [id number] DOB: March 13, 1951 Today's Date: 03/11/2023   MEWS Flowsheet Documentation:  Assess: MEWS Score Temp: 98.8 F (37.1 C) BP: 118/82 MAP (mmHg): 92 Pulse Rate: (!) 128 ECG Heart Rate: (!) 121 Resp: (!) 25 Level of Consciousness: Alert SpO2: 95 % O2 Device: Nasal Cannula O2 Flow Rate (L/min): 3 L/min Assess: MEWS Score MEWS Temp: 0 MEWS Systolic: 0 MEWS Pulse: 2 MEWS RR: 1 MEWS LOC: 0 MEWS Score: 3 MEWS Score Color: Yellow Assess: SIRS CRITERIA SIRS Temperature : 0 SIRS Respirations : 1 SIRS Pulse: 1 SIRS WBC: 0 SIRS Score Sum : 2 SIRS Temperature : 0 SIRS Pulse: 1 SIRS Respirations : 1 SIRS WBC: 0 SIRS Score Sum : 2 Assess: if the MEWS score is Yellow or Red Were vital signs accurate and taken at a resting state?: Yes Does the patient meet 2 or more of the SIRS criteria?: No MEWS guidelines implemented : Yes, red Treat MEWS Interventions: Considered administering scheduled or prn medications/treatments as ordered Take Vital Signs Increase Vital Sign Frequency : Red: Q1hr x2, continue Q4hrs until patient remains green for 12hrs Escalate MEWS: Escalate: Red: Discuss with charge nurse and notify provider. Consider notifying RRT. If remains red for 2 hours consider need for higher level of care        Reeva Davern 03/11/2023, 12:50 PM

## 2023-03-11 NOTE — Progress Notes (Signed)
PROGRESS NOTE                                                                                                                                                                                                             Patient Demographics:    Rhonda Marshall, is a 72 y.o. female, DOB - [birth year], ZOX:[id number]  Outpatient Primary MD for the patient is Knox Royalty, MD    LOS - 1  Admit date - 03/10/2023    Chief Complaint  Patient presents with   Fever       Brief Narrative (HPI from H&P)   72 y.o. female with medical history significant of persistent atrial fibrillation on Eliquis, chronic systolic heart failure, LBBB, right carotid artery stenosis, laryngeal cancer, hyperparathyroidism, rheumatoid arthritis on immunosuppressive's, anxiety who presents with fever and weakness about 2 days, in the ER was found to be septic due to UTI and pneumonia, she was hypotensive along with an A-fib RVR.  She was admitted for further care.   Subjective:    Rhonda Marshall today has, No headache, No chest pain, No abdominal pain - No Nausea, No new weakness tingling or numbness, no SOB   Assessment  & Plan :    Sepsis present on admission caused by combination of UTI and atypical pneumonia on CT chest. she currently on empiric antibiotics which will be continued, IV fluid bolus and maintenance continued, sepsis pathophysiology gradually improving, follow cultures, encouraged to sit in chair use I-S and flutter valve for pulmonary toiletry.  Bladder scans for complete voiding, add low-dose Solu-Medrol for blood pressure support.  If needed will add midodrine as well and monitor.   Hyponatremia - Mild.  Secondary to sepsis and dehydration improved with IV fluids.  Metabolic acidosis Secondary to hypotension and AKI.  Hydrate and monitor.  AKI (acute kidney injury) (HCC)   - Due to sepsis and dehydration, hold Entresto, hydrate and  monitor.  Hypotension  - Hydrate, blood pressure improving.  Continue to hold Entresto.  Hyperthyroidism  - Continue methimazole, stable TSH and free T4.  Chronic systolic CHF (congestive heart failure) (HCC)  - Last echocardiogram few months ago shows EF of 40%, with global hypokinesis, currently severely dehydrated, hydrate with caution and monitor, at risk for developing fluid overload, monitor closely.  Entresto held due to hypotension, beta-blocker continued as tolerated  by blood pressure and monitor.  Chronic A-fib, in RVR due to sepsis - Continue Eliquis, beta-blocker, 2 doses of digoxin, hydrate and monitor.  Mild cognitive impairment.  At risk for delirium, per family previous hospitalizations she was confused, monitor.  DM. type II.  ISS.  She is on Jardiance, for now hold due to UTI, due to some anion gap and metabolic acidosis will check beta hydroxybutyric acid to rule out euglycemic DKA.       Condition - Extremely Guarded  Family Communication  :  daughter in law Elease Hashimoto [phone]   Code Status :  Full  Consults  :  None  PUD Prophylaxis :    Procedures  :     TTE 02/04/23 -    1. Left ventricular ejection fraction, by estimation, is 35 to 40%. The left ventricle has moderately decreased function. The left ventricle  demonstrates global hypokinesis. Left ventricular diastolic function could not be evaluated.   2. Right ventricular systolic function is normal. The right ventricular size is normal. There is normal pulmonary artery systolic pressure. The estimated right ventricular systolic pressure is 33.5 mmHg.   3. Left atrial size was mildly dilated.   4. The mitral valve is grossly normal. Moderate mitral valve regurgitation. No evidence of mitral stenosis.   5. The aortic valve is tricuspid. Aortic valve regurgitation is not visualized. No aortic stenosis is present.   6. The inferior vena cava is normal in size with greater than 50% respiratory variability,  suggesting right atrial pressure of 3 mmHg.    CT - 1. Patchy infiltrates throughout the lungs likely representing multifocal pneumonia. 2. Incidental heterogeneous and enlarged thyroid. Recommend non-emergent thyroid ultrasound. Reference: J Am Coll Radiol. 2015 Feb;12(2): 143-50 3. Nonspecific gallbladder wall thickening and edema. No stones. Consider ultrasound for further evaluation. 4. Diffuse pancreatic ductal dilatation with evidence of pancreatic atrophy. This could represent early acute or chronic pancreatitis but an occult obstructing lesion is not excluded. Consider follow-up with elective MRCP. 5. Aortic atherosclerosis. 6. Cardiac enlargement      Disposition Plan  :    Status is: Inpatient  DVT Prophylaxis  :    Place TED hose Start: 03/11/23 0827 apixaban (ELIQUIS) tablet 5 mg     Lab Results  Component Value Date   PLT 197 03/11/2023    Diet :  Diet Order             DIET DYS 3 Room service appropriate? No; Fluid consistency: Thin  Diet effective now                    Inpatient Medications  Scheduled Meds:  apixaban  5 mg Oral BID   azithromycin  500 mg Oral Daily   digoxin  0.25 mg Intravenous Q6H   methimazole  5 mg Oral Daily   metoprolol tartrate  100 mg Oral BID   metoprolol tartrate  50 mg Oral Once   Continuous Infusions:  cefTRIAXone (ROCEPHIN)  IV 1 g (03/11/23 0836)   lactated ringers 100 mL/hr at 03/11/23 1035   PRN Meds:.acetaminophen, ALPRAZolam, diltiazem, oxyCODONE-acetaminophen **AND** oxyCODONE    Objective:   Vitals:   03/11/23 0841 03/11/23 0842 03/11/23 0843 03/11/23 1047  BP:    120/86  Pulse: (!) 136 (!) 139 (!) 134 (!) 118  Resp: (!) 27 (!) 23 (!) 24 (!) 25  Temp:    98.7 F (37.1 C)  TempSrc:    Oral  SpO2: 92% 96% 98% 97%  Weight:      Height:        Wt Readings from Last 3 Encounters:  03/10/23 51.7 kg  02/16/23 52.2 kg  01/07/23 53 kg     Intake/Output Summary (Last 24 hours) at 03/11/2023 1107 Last  data filed at 03/10/2023 1720 Gross per 24 hour  Intake 3152.87 ml  Output --  Net 3152.87 ml     Physical Exam  Awake Alert, No new F.N deficits, Normal affect [place].AT,PERRAL Supple Neck, No JVD,   Symmetrical Chest wall movement, Good air movement bilaterally, CTAB iRRR,No Gallops,Rubs or new Murmurs,  +ve B.Sounds, Abd Soft, No tenderness,   No Cyanosis, Clubbing or edema       Data Review:    Recent Labs  Lab 03/10/23 1330 03/11/23 0755  WBC 11.7* 10.2  HGB 12.2 11.7*  HCT 35.5* 35.6*  PLT 188 197  MCV 93.7 94.4  MCH 32.2 31.0  MCHC 34.4 32.9  RDW 14.2 14.6  LYMPHSABS 0.8 1.5  MONOABS 0.7 1.0  EOSABS 0.0 0.1  BASOSABS 0.0 0.0    Recent Labs  Lab 03/10/23 1330 03/11/23 0755  NA 125* 134*  K 4.8 4.3  CL 96* 97*  CO2 18* 21*  ANIONGAP 11 16*  GLUCOSE 106* 102*  BUN 21 17  CREATININE 1.28* 1.37*  AST 26 43*  ALT 17 29  ALKPHOS 238* 246*  BILITOT 0.6 0.6  ALBUMIN 3.7 2.8*  CRP  --  21.9*  LATICACIDVEN 1.3  --   INR 1.5*  --   TSH 1.089 0.912  BNP  --  2,232.7*  MG  --  1.9  CALCIUM 9.9 9.5      Recent Labs  Lab 03/10/23 1330 03/11/23 0755  CRP  --  21.9*  LATICACIDVEN 1.3  --   INR 1.5*  --   TSH 1.089 0.912  BNP  --  2,232.7*  MG  --  1.9  CALCIUM 9.9 9.5     Recent Labs    03/11/23 0755  TSH 0.912  FREET4 0.93     Micro Results Recent Results (from the past 240 hour(s))  Blood Culture (routine x 2)     Status: None (Preliminary result)   Collection Time: 03/10/23  1:30 PM   Specimen: BLOOD LEFT WRIST  Result Value Ref Range Status   Specimen Description   Final    BLOOD LEFT WRIST Performed at [facility], [address], [place], [place] [address]    Special Requests   Final    Blood Culture adequate volume BOTTLES DRAWN AEROBIC AND ANAEROBIC Performed at [facility], [address] [place], [place] [address]    Culture   Final    NO GROWTH < 24 HOURS Performed at [facility], [address] [place], [place] [address]    Report Status PENDING  Incomplete  Resp panel by RT-PCR (RSV, Flu A&B, Covid) Anterior Nasal Swab     Status: None   Collection Time: 03/10/23  1:39 PM   Specimen: Anterior Nasal Swab  Result Value Ref Range Status   SARS Coronavirus 2 by RT PCR NEGATIVE NEGATIVE Final    Comment: (NOTE) SARS-CoV-2 target nucleic acids are NOT DETECTED.  The SARS-CoV-2 RNA is generally detectable in upper respiratory specimens during the acute phase of infection. The lowest concentration of SARS-CoV-2 viral copies this assay can detect is 138 copies/mL. A negative result does not preclude SARS-Cov-2 infection and should not be used as  the sole basis for treatment or other patient management decisions. A negative result may occur with  improper specimen collection/handling, submission of specimen other than nasopharyngeal swab, presence of viral mutation(s) within the areas targeted by this assay, and inadequate number of viral copies(<138 copies/mL). A negative result must be combined with clinical observations, patient history, and epidemiological information. The expected result is Negative.  Fact Sheet for Patients:  BloggerCourse.com  Fact Sheet for Healthcare Providers:  SeriousBroker.it  This test is no t yet approved or cleared by the [place] FDA and  has been authorized for detection and/or diagnosis of SARS-CoV-2 by FDA under an Emergency Use Authorization (EUA). This EUA will remain  in effect (meaning this test can be used) for the duration of the COVID-19 declaration under Section 564(b)(1) of the Act, 21 U.S.C.section 360bbb-3(b)(1), unless the authorization is terminated  or revoked sooner.       Influenza A by PCR NEGATIVE NEGATIVE Final   Influenza B by PCR NEGATIVE NEGATIVE Final    Comment: (NOTE) The Xpert Xpress SARS-CoV-2/FLU/RSV plus assay is intended as an aid in the  diagnosis of influenza from Nasopharyngeal swab specimens and should not be used as a sole basis for treatment. Nasal washings and aspirates are unacceptable for Xpert Xpress SARS-CoV-2/FLU/RSV testing.  Fact Sheet for Patients: BloggerCourse.com  Fact Sheet for Healthcare Providers: SeriousBroker.it  This test is not yet approved or cleared by the [place] FDA and has been authorized for detection and/or diagnosis of SARS-CoV-2 by FDA under an Emergency Use Authorization (EUA). This EUA will remain in effect (meaning this test can be used) for the duration of the COVID-19 declaration under Section 564(b)(1) of the Act, 21 U.S.C. section 360bbb-3(b)(1), unless the authorization is terminated or revoked.     Resp Syncytial Virus by PCR NEGATIVE NEGATIVE Final    Comment: (NOTE) Fact Sheet for Patients: BloggerCourse.com  Fact Sheet for Healthcare Providers: SeriousBroker.it  This test is not yet approved or cleared by the [place] FDA and has been authorized for detection and/or diagnosis of SARS-CoV-2 by FDA under an Emergency Use Authorization (EUA). This EUA will remain in effect (meaning this test can be used) for the duration of the COVID-19 declaration under Section 564(b)(1) of the Act, 21 U.S.C. section 360bbb-3(b)(1), unless the authorization is terminated or revoked.  Performed at [facility], [address], [place], [place] [address]   Blood Culture (routine x 2)     Status: None (Preliminary result)   Collection Time: 03/10/23  1:39 PM   Specimen: BLOOD  Result Value Ref Range Status   Specimen Description   Final    BLOOD LEFT ANTECUBITAL Performed at [facility], [address] [place], [place] [address]    Special Requests   Final    BOTTLES DRAWN AEROBIC AND ANAEROBIC Blood Culture results may not be  optimal due to an inadequate volume of blood received in culture bottles Performed at [facility], [address] [place], [place] [address]    Culture   Final    NO GROWTH < 24 HOURS Performed at [facility], [address], [place], [place] [address]    Report Status PENDING  Incomplete    Radiology Reports DG Chest Port 1 View  Result Date: 03/11/2023 CLINICAL DATA:  Fever and shortness of breath. EXAM: PORTABLE CHEST 1 VIEW COMPARISON:  03/10/2023 FINDINGS: There is mild cardiac enlargement. Diffusely increased interstitial markings are noted concerning for interstitial edema. Persistent  bilateral lower lobe peribronchovascular airspace opacities. No signs of pleural effusion. Visualized osseous structures are unremarkable. IMPRESSION: 1. No change from previous exam. 2. Cardiomegaly and interstitial edema. 3. Persistent bilateral lower lobe peribronchovascular airspace opacities compatible with multifocal pneumonia. Electronically Signed   By: Signa Kell M.D.   On: 03/11/2023 06:41   US Abdomen Limited RUQ (LIVER/GB)  Result Date: 03/10/2023 CLINICAL DATA:  Acute pancreatitis. EXAM: ULTRASOUND ABDOMEN LIMITED RIGHT UPPER QUADRANT COMPARISON:  CT 03/10/2023 FINDINGS: Gallbladder: No gallstones or sludge. Gallbladder wall is mildly thickened at 3.8 mm. Murphy's sign is negative. Common bile duct: Diameter: 7 mm, normal Liver: No focal lesion identified. Within normal limits in parenchymal echogenicity. Portal vein is patent on color Doppler imaging with normal direction of blood flow towards the liver. Other: Dilated pancreatic duct at 5 mm. IMPRESSION: 1. Mild gallbladder wall thickening. No evidence of cholelithiasis or acute cholecystitis. 2. Pancreatic ductal dilatation, nonspecific. Electronically Signed   By: Burman Nieves M.D.   On: 03/10/2023 22:47   CT CHEST ABDOMEN PELVIS W CONTRAST  Result Date: 03/10/2023 CLINICAL DATA:  Sepsis. EXAM: CT CHEST,  ABDOMEN, AND PELVIS WITH CONTRAST TECHNIQUE: Multidetector CT imaging of the chest, abdomen and pelvis was performed following the standard protocol during bolus administration of intravenous contrast. RADIATION DOSE REDUCTION: This exam was performed according to the departmental dose-optimization program which includes automated exposure control, adjustment of the mA and/or kV according to patient size and/or use of iterative reconstruction technique. CONTRAST:  85mL OMNIPAQUE IOHEXOL 350 MG/ML SOLN COMPARISON:  Chest radiograph 03/10/2023.  CT abdomen 10/14/2019 FINDINGS: CT CHEST FINDINGS Cardiovascular: Mild cardiac enlargement. No pericardial effusions. Normal caliber thoracic aorta. No dissection. Calcification of the aorta and coronary arteries. 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 Esophagus is decompressed. Scattered lymph nodes are not pathologically enlarged. Lungs/Pleura: Patchy and confluent airspace infiltrates throughout both lungs. Changes likely to represent multifocal pneumonia. No pleural effusions. No pneumothorax. Airways are patent. Musculoskeletal: No chest wall mass or suspicious bone lesions identified. CT ABDOMEN PELVIS FINDINGS Hepatobiliary: Mild diffuse fatty infiltration of the liver. No focal lesions. Gallbladder wall is thickened and edematous. No stones identified. Changes could indicate cholecystitis or may be related to liver disease. Consider correlation with ultrasound. Pancreas: Diffuse pancreatic ductal dilatation. Mild parenchymal atrophy. No obstructing stone or lesion identified. No peripancreatic edema or collections. Changes could be due to early acute or chronic pancreatitis or to an occult obstructing lesion or stone. Consider MRCP for further evaluation. Spleen: Normal in size without focal abnormality. Adrenals/Urinary Tract: No adrenal gland nodules. Kidneys are symmetrical. No hydronephrosis or  hydroureter. Bladder is normal. Stomach/Bowel: Stomach, small bowel, and colon are not abnormally distended. No wall thickening or inflammatory changes. Stool throughout the colon. Colonic diverticulosis without evidence of acute diverticulitis. Appendix is normal. Vascular/Lymphatic: Aortic atherosclerosis. No enlarged abdominal or pelvic lymph nodes. Reproductive: Uterus and bilateral adnexa are unremarkable. Other: No abdominal wall hernia or abnormality. No abdominopelvic ascites. Musculoskeletal: Postoperative right hip hemiarthroplasty. Degenerative changes in the spine. IMPRESSION: 1. Patchy infiltrates throughout the lungs likely representing multifocal pneumonia. 2. Incidental heterogeneous and enlarged thyroid. Recommend non-emergent thyroid ultrasound. Reference: J Am Coll Radiol. 2015 Feb;12(2): 143-50 3. Nonspecific gallbladder wall thickening and edema. No stones. Consider ultrasound for further evaluation. 4. Diffuse pancreatic ductal dilatation with evidence of pancreatic atrophy. This could represent early acute or chronic pancreatitis but an occult obstructing lesion is not excluded. Consider follow-up with elective MRCP. 5. Aortic atherosclerosis. 6. Cardiac  enlargement. Electronically Signed   By: Burman Nieves M.D.   On: 03/10/2023 18:23   DG Chest Port 1 View  Result Date: 03/10/2023 CLINICAL DATA:  Fever for 2 days.  Possible sepsis. EXAM: PORTABLE CHEST 1 VIEW COMPARISON:  01/18/2022 FINDINGS: Midline trachea. Mild cardiomegaly. Atherosclerosis in the transverse aorta. No pleural effusion or pneumothorax. Moderate pulmonary interstitial thickening is felt to be slightly increased. No lobar consolidation. IMPRESSION: Moderate pulmonary interstitial thickening, felt to be slightly increased. This could be related to smoking/chronic bronchitis (ex-smoker quitting 3 years ago). However, viral or atypical/mycoplasma pneumonia could look similar. Aortic Atherosclerosis (ICD10-I70.0).  Electronically Signed   By: Jeronimo Greaves M.D.   On: 03/10/2023 15:09      Signature  -   Susa Raring M.D on 03/11/2023 at 11:07 AM   -  To page go to [email or web address]

## 2023-03-11 NOTE — Assessment & Plan Note (Signed)
-  Continue methimazole ?

## 2023-03-11 NOTE — Assessment & Plan Note (Signed)
-   Mild.  Secondary to hypovolemic hyponatremia.  Follow trend with IV fluids.

## 2023-03-11 NOTE — Assessment & Plan Note (Signed)
-   Continue Eliquis - Holding beta-blocker for now due to hypotension

## 2023-03-11 NOTE — Assessment & Plan Note (Signed)
Secondary to hypotension and worsening renal function - Continue IV fluid hydration

## 2023-03-11 NOTE — Assessment & Plan Note (Signed)
-  Creatinine elevated to 1.28 from prior of 1 secondary to dehydration/hypotension - Continue IV fluids - Hold home Reubens

## 2023-03-11 NOTE — Assessment & Plan Note (Addendum)
-   Per outpatient cardiology patient at baseline has low blood pressure at baseline around 90s over 60s.  She has been intolerant to titration of her medications in the past. -BP now improved up to SBP 110 -will d/c fluid to avoid overload -home antihypertensives overnight

## 2023-03-11 NOTE — Evaluation (Signed)
Occupational Therapy Evaluation Patient Details Name: Rhonda Marshall MRN: [id number] DOB: [birth year] Today's Date: 03/11/2023   History of Present Illness 72 y.o. female  who presents 03/10/23 with fever and weakness. +sepsis due to pna or UTI; hypotension; AKI; PMH significant of atrial fibrillation on Eliquis, chronic systolic heart failure EF 30%, LBBB, right carotid artery stenosis, laryngeal cancer, hyperparathyroidism, rheumatoid arthritis on immunosuppressive's, anxiety   Clinical Impression   Pt admitted for above, PTA pt reports being independent in mobility and ADLs. Pt currently limited by elevated HR this session, making it difficult to fully assess functional capacity. Pt cognition seems WFL despite her tangential speech, she completes bed mobility with supervision. OOB mobility deferred due to spikes/fluctuations in HR making it unsafe to mobilize pt. OT to follow-up with patient at a later date to get a more accurate functional assessment, pt does demonstrate ability to complete bed level ADLs in long sitting with setup assist. DC recs pending further assessment.       If plan is discharge home, recommend the following: Other (comment) (pending further assessment)    Functional Status Assessment  Patient has had a recent decline in their functional status and demonstrates the ability to make significant improvements in function in a reasonable and predictable amount of time.  Equipment Recommendations  None recommended by OT (TBD)    Recommendations for Other Services       Precautions / Restrictions Precautions Precautions: Fall Restrictions Weight Bearing Restrictions: No      Mobility Bed Mobility Overal bed mobility: Needs Assistance Bed Mobility: Sit to Supine, Supine to Sit     Supine to sit: Supervision, Used rails Sit to supine: Supervision, Used rails   General bed mobility comments: Bridges hips on command for positioning of bed pan    Transfers            General transfer comment: defer      Balance Overall balance assessment: Needs assistance Sitting-balance support: No upper extremity supported, Feet supported Sitting balance-Leahy Scale: Fair                 ADL either performed or assessed with clinical judgement   ADL Overall ADL's : Needs assistance/impaired Eating/Feeding: Independent;Bed level   Grooming: Bed level;Set up   Upper Body Bathing: Set up;Bed level   Lower Body Bathing: Set up;Bed level   Upper Body Dressing : Set up;Bed level   Lower Body Dressing: Set up;Bed level Lower Body Dressing Details (indicate cue type and reason): donning socks               General ADL Comments: OOB mobility deferred this session due to HR concerns/complications      Pertinent Vitals/Pain Pain Assessment Pain Assessment: No/denies pain     Extremity/Trunk Assessment Upper Extremity Assessment Upper Extremity Assessment: Generalized weakness   Lower Extremity Assessment Lower Extremity Assessment: Defer to PT evaluation       Communication Communication Communication: Difficulty communicating thoughts/reduced clarity of speech Cueing Techniques: Verbal cues;Tactile cues   Cognition Arousal: Alert Behavior During Therapy: WFL for tasks assessed/performed Overall Cognitive Status: No family/caregiver present to determine baseline cognitive functioning         General Comments: Tangenital speech, fiddles with lines/leads     General Comments  Pt HR in afib and fluctuating 130-150 at rest, up to 165 bpm with mobility to EOB. Discussed with RN that pt is not safe for OOB mobility at this time, pt positioned on bed pan due to need  to use restroom            Home Living Family/patient expects to be discharged to:: Private residence Living Arrangements: Children (son and his wife) Available Help at Discharge: Family;Available PRN/intermittently (son works until 4:30, the wife is home all  the time) Type of Home: House Home Access: Stairs to enter [employer] of Steps: 3 Entrance Stairs-Rails: Can reach both Home Layout: Two level;Able to live on main level with bedroom/bathroom     Bathroom Shower/Tub: Producer, television/film/video: Standard     Home Equipment: Pharmacist, hospital (2 wheels);BSC/3in1          Prior Functioning/Environment Prior Level of Function : Independent/Modified Independent             Mobility Comments: ind no AD. ADLs Comments: ind        OT Problem List: Cardiopulmonary status limiting activity;Decreased strength      OT Treatment/Interventions: Self-care/ADL training;Balance training;Therapeutic exercise;Therapeutic activities;Patient/family education    OT Goals(Current goals can be found in the care plan section) Acute Rehab OT Goals Patient Stated Goal: To get up and moving again OT Goal Formulation: With patient Time For Goal Achievement: 03/25/23 Potential to Achieve Goals: Good ADL Goals Pt Will Perform Grooming: standing;with supervision Pt Will Perform Lower Body Dressing: with modified independence;sitting/lateral leans Pt Will Transfer to Toilet: with modified independence;ambulating Pt Will Perform Toileting - Clothing Manipulation and hygiene: sit to/from stand;with modified independence  OT Frequency: Min 1X/week    Co-evaluation              AM-PAC OT "6 Clicks" Daily Activity     Outcome Measure Help from another person eating meals?: None Help from another person taking care of personal grooming?: A Little Help from another person toileting, which includes using toliet, bedpan, or urinal?: A Little Help from another person bathing (including washing, rinsing, drying)?: A Little Help from another person to put on and taking off regular upper body clothing?: A Little Help from another person to put on and taking off regular lower body clothing?: A Little 6 Click Score: 19    End of Session Equipment Utilized During Treatment: Oxygen Nurse Communication: Mobility status  Activity Tolerance: Treatment limited secondary to medical complications (Comment) (elevated HR) Patient left: in bed;with bed alarm set;with call bell/phone within reach  OT Visit Diagnosis: Muscle weakness (generalized) (M62.81)                Time: 1610-9604 OT Time Calculation (min): 20 min Charges:  OT General Charges $OT Visit: 1 Visit OT Evaluation $OT Eval Low Complexity: 1 Low  03/11/2023  AB, OTR/L  Acute Rehabilitation Services  Office: [phone]   Tristan Schroeder 03/11/2023, 10:23 AM

## 2023-03-11 NOTE — Assessment & Plan Note (Addendum)
Last echocardiogram in July 2024 with EF of 30 to 35%, global hypokinesis and no significant valvular abnormalities which is unchanged from prior. -stopping continuous IV fluid now with improved BP to avoid fluid overload

## 2023-03-12 ENCOUNTER — Inpatient Hospital Stay (HOSPITAL_COMMUNITY): Payer: 59

## 2023-03-12 ENCOUNTER — Telehealth: Payer: Self-pay | Admitting: Cardiology

## 2023-03-12 DIAGNOSIS — A419 Sepsis, unspecified organism: Secondary | ICD-10-CM | POA: Diagnosis not present

## 2023-03-12 LAB — COMPREHENSIVE METABOLIC PANEL WITH GFR
ALT: 33 U/L (ref 0–44)
AST: 46 U/L — ABNORMAL HIGH (ref 15–41)
Albumin: 2.7 g/dL — ABNORMAL LOW (ref 3.5–5.0)
Alkaline Phosphatase: 235 U/L — ABNORMAL HIGH (ref 38–126)
Anion gap: 10 (ref 5–15)
BUN: 20 mg/dL (ref 8–23)
CO2: 19 mmol/L — ABNORMAL LOW (ref 22–32)
Calcium: 8.7 mg/dL — ABNORMAL LOW (ref 8.9–10.3)
Chloride: 100 mmol/L (ref 98–111)
Creatinine, Ser: 1.34 mg/dL — ABNORMAL HIGH (ref 0.44–1.00)
GFR, Estimated: 42 mL/min — ABNORMAL LOW (ref >60)
Glucose, Bld: 164 mg/dL — ABNORMAL HIGH (ref 70–99)
Potassium: 4.8 mmol/L (ref 3.5–5.1)
Sodium: 129 mmol/L — ABNORMAL LOW (ref 135–145)
Total Bilirubin: 0.8 mg/dL (ref 0.3–1.2)
Total Protein: 6.1 g/dL — ABNORMAL LOW (ref 6.5–8.1)

## 2023-03-12 LAB — OSMOLALITY, URINE: Osmolality, Ur: 376 mosm/kg (ref 300–900)

## 2023-03-12 LAB — OSMOLALITY: Osmolality: 288 mOsm/kg (ref 275–295)

## 2023-03-12 LAB — CBC WITH DIFFERENTIAL/PLATELET
Abs Immature Granulocytes: 0.08 10*3/uL — ABNORMAL HIGH (ref 0.00–0.07)
Basophils Absolute: 0 10*3/uL (ref 0.0–0.1)
Basophils Relative: 0 %
Eosinophils Absolute: 0 10*3/uL (ref 0.0–0.5)
Eosinophils Relative: 0 %
HCT: 32.4 % — ABNORMAL LOW (ref 36.0–46.0)
Hemoglobin: 10.9 g/dL — ABNORMAL LOW (ref 12.0–15.0)
Immature Granulocytes: 1 %
Lymphocytes Relative: 9 %
Lymphs Abs: 0.5 10*3/uL — ABNORMAL LOW (ref 0.7–4.0)
MCH: 31.2 pg (ref 26.0–34.0)
MCHC: 33.6 g/dL (ref 30.0–36.0)
MCV: 92.8 fL (ref 80.0–100.0)
Monocytes Absolute: 0.2 10*3/uL (ref 0.1–1.0)
Monocytes Relative: 3 %
Neutro Abs: 4.8 10*3/uL (ref 1.7–7.7)
Neutrophils Relative %: 87 %
Platelets: 169 10*3/uL (ref 150–400)
RBC: 3.49 MIL/uL — ABNORMAL LOW (ref 3.87–5.11)
RDW: 14.5 % (ref 11.5–15.5)
WBC: 5.6 10*3/uL (ref 4.0–10.5)
nRBC: 0 % (ref 0.0–0.2)

## 2023-03-12 LAB — GLUCOSE, CAPILLARY
Glucose-Capillary: 220 mg/dL — ABNORMAL HIGH (ref 70–99)
Glucose-Capillary: 175 mg/dL — ABNORMAL HIGH (ref 70–99)
Glucose-Capillary: 171 mg/dL — ABNORMAL HIGH (ref 70–99)

## 2023-03-12 LAB — SODIUM, URINE, RANDOM: Sodium, Ur: 14 mmol/L

## 2023-03-12 LAB — C-REACTIVE PROTEIN: CRP: 17.5 mg/dL — ABNORMAL HIGH (ref <1.0)

## 2023-03-12 LAB — BRAIN NATRIURETIC PEPTIDE: B Natriuretic Peptide: 4293 pg/mL — ABNORMAL HIGH (ref 0.0–100.0)

## 2023-03-12 LAB — HEMOGLOBIN A1C
Hgb A1c MFr Bld: 6.1 % — ABNORMAL HIGH (ref 4.8–5.6)
Mean Plasma Glucose: 128.37 mg/dL

## 2023-03-12 LAB — CREATININE, URINE, RANDOM: Creatinine, Urine: 55 mg/dL

## 2023-03-12 LAB — URIC ACID: Uric Acid, Serum: 5 mg/dL (ref 2.5–7.1)

## 2023-03-12 LAB — MAGNESIUM: Magnesium: 2 mg/dL (ref 1.7–2.4)

## 2023-03-12 LAB — PROCALCITONIN: Procalcitonin: 1.14 ng/mL

## 2023-03-12 LAB — T3, FREE: T3, Free: 3.6 pg/mL (ref 2.0–4.4)

## 2023-03-12 MED ORDER — INSULIN ASPART 100 UNIT/ML IJ SOLN
0.0000 [IU] | Freq: Three times a day (TID) | INTRAMUSCULAR | Status: DC
  Administered 2023-03-12: 2 [IU] via SUBCUTANEOUS
  Administered 2023-03-12: 3 [IU] via SUBCUTANEOUS
  Administered 2023-03-13: 2 [IU] via SUBCUTANEOUS
  Administered 2023-03-13 (×2): 1 [IU] via SUBCUTANEOUS
  Administered 2023-03-14: 2 [IU] via SUBCUTANEOUS
  Administered 2023-03-14: 1 [IU] via SUBCUTANEOUS
  Administered 2023-03-14 – 2023-03-15 (×2): 2 [IU] via SUBCUTANEOUS

## 2023-03-12 MED ORDER — INSULIN ASPART 100 UNIT/ML IJ SOLN: 0.0000 [IU] | Freq: Every day | INTRAMUSCULAR | Status: DC

## 2023-03-12 MED ORDER — LACTATED RINGERS IV SOLN: INTRAVENOUS | Status: DC

## 2023-03-12 MED ORDER — OXYCODONE-ACETAMINOPHEN 5-325 MG PO TABS
1.0000 | ORAL_TABLET | Freq: Four times a day (QID) | ORAL | Status: DC | PRN
  Administered 2023-03-12 – 2023-03-15 (×7): 1 via ORAL
  Administered 2023-03-15: 2 via ORAL
  Filled 2023-03-12: qty 2
  Filled 2023-03-12 (×7): qty 1

## 2023-03-12 MED ORDER — SODIUM BICARBONATE 650 MG PO TABS
650.0000 mg | ORAL_TABLET | Freq: Three times a day (TID) | ORAL | Status: AC
  Administered 2023-03-12 (×2): 650 mg via ORAL
  Filled 2023-03-12 (×2): qty 1

## 2023-03-12 MED ORDER — LACTATED RINGERS IV SOLN: INTRAVENOUS | Status: AC

## 2023-03-12 NOTE — Plan of Care (Signed)

## 2023-03-12 NOTE — Evaluation (Signed)
Physical Therapy Evaluation and Discharge Patient Details Name: Rhonda Marshall MRN: [id number] DOB: Nov 14, 1950 Today's Date: 03/12/2023  History of Present Illness  72 y.o. female  who presents 03/10/23 with fever and weakness. +sepsis due to pna or UTI; hypotension; AKI; PMH significant of atrial fibrillation on Eliquis, chronic systolic heart failure EF 30%, LBBB, right carotid artery stenosis, laryngeal cancer, hyperparathyroidism, rheumatoid arthritis on immunosuppressive's, anxiety  Clinical Impression   Patient evaluated by Physical Therapy with no further acute PT needs identified. Patient ambulated without device or imbalance x 170 ft on RA with sats 96% and HR 88-96.  PT is signing off. Thank you for this referral.         If plan is discharge home, recommend the following: Help with stairs or ramp for entrance   Can travel by private vehicle        Equipment Recommendations None recommended by PT  Recommendations for Other Services       Functional Status Assessment Patient has not had a recent decline in their functional status     Precautions / Restrictions Precautions Precautions: Fall Restrictions Weight Bearing Restrictions: No      Mobility  Bed Mobility               General bed mobility comments: sitting on EOB on arrival    Transfers Overall transfer level: Independent Equipment used: None               General transfer comment: pt stood from EOB prior to PT instructing; sat in chair and stood from chair I'ly    Ambulation/Gait Ambulation/Gait assistance: Supervision Gait Distance (Feet): 170 Feet Assistive device: None Gait Pattern/deviations: WFL(Within Functional Limits)   Gait velocity interpretation: >2.62 ft/sec, indicative of community ambulatory   General Gait Details: pt talking throughout ambulation and sats maintained 96% on RA  Stairs            Wheelchair Mobility     Tilt Bed    Modified Rankin (Stroke  Patients Only)       Balance Overall balance assessment: Needs assistance Sitting-balance support: No upper extremity supported, Feet supported Sitting balance-Leahy Scale: Fair     Standing balance support: No upper extremity supported, During functional activity Standing balance-Leahy Scale: Good Standing balance comment: reaching to floor to pick up item wihtout imbalance                             Pertinent Vitals/Pain Pain Assessment Pain Assessment: No/denies pain    Home Living Family/patient expects to be discharged to:: Private residence Living Arrangements: Children (son and his wife) Available Help at Discharge: Family;Available 24 hours/day (son works until 4:30, the wife is home all the time) Type of Home: House Home Access: Stairs to enter Entrance Stairs-Rails: Can reach both Entrance Stairs-Number of Steps: 3   Home Layout: Two level;Able to live on main level with bedroom/bathroom Home Equipment: Shower seat;Rolling Walker (2 wheels);BSC/3in1      Prior Function Prior Level of Function : Independent/Modified Independent             Mobility Comments: ind no AD. ADLs Comments: ind     Extremity/Trunk Assessment   Upper Extremity Assessment Upper Extremity Assessment: Defer to OT evaluation    Lower Extremity Assessment Lower Extremity Assessment: Overall WFL for tasks assessed    Cervical / Trunk Assessment Cervical / Trunk Assessment: Normal  Communication   Communication Communication: No apparent  difficulties  Cognition Arousal: Alert Behavior During Therapy: WFL for tasks assessed/performed Overall Cognitive Status: No family/caregiver present to determine baseline cognitive functioning                                 General Comments: Tangenital speech, fiddles with lines/leads; a&ox4        General Comments General comments (skin integrity, edema, etc.): HR 88-96 during ambulation; mild dyspnea with  sats stable    Exercises     Assessment/Plan    PT Assessment Patient does not need any further PT services  PT Problem List         PT Treatment Interventions      PT Goals (Current goals can be found in the Care Plan section)  Acute Rehab PT Goals PT Goal Formulation: All assessment and education complete, DC therapy    Frequency       Co-evaluation               AM-PAC PT "6 Clicks" Mobility  Outcome Measure Help needed turning from your back to your side while in a flat bed without using bedrails?: None Help needed moving from lying on your back to sitting on the side of a flat bed without using bedrails?: None Help needed moving to and from a bed to a chair (including a wheelchair)?: None Help needed standing up from a chair using your arms (e.g., wheelchair or bedside chair)?: None Help needed to walk in hospital room?: A Little Help needed climbing 3-5 steps with a railing? : A Little 6 Click Score: 22    End of Session Equipment Utilized During Treatment: Gait belt Activity Tolerance: Patient tolerated treatment well Patient left: in chair;with call bell/phone within reach;with chair alarm set Nurse Communication: Mobility status;Other (comment) (stood from EOB unattended; chair alarm on) PT Visit Diagnosis: Difficulty in walking, not elsewhere classified (R26.2)    Time: 1610-9604 PT Time Calculation (min) (ACUTE ONLY): 23 min   Charges:   PT Evaluation $PT Eval Low Complexity: 1 Low   PT General Charges $$ ACUTE PT VISIT: 1 Visit          Jerolyn Center, PT Acute Rehabilitation Services  Office [phone]   Zena Amos 03/12/2023, 10:06 AM

## 2023-03-12 NOTE — Care Management Important Message (Signed)
Important Message  Patient Details  Name: Rhonda Marshall MRN: [id number] Date of Birth: 1951/05/29   Medicare Important Message Given:  Yes     [name] 03/12/2023, 3:11 PM

## 2023-03-12 NOTE — Telephone Encounter (Signed)
Called pt daughter back with Dr. Jenene Slicker message. LVM for her to call back with any questions.   She is admitted with sepsis and this causes low BP.  Meds have to be held and will only be restarted slowly as her BP allows.  I looked over the notes.

## 2023-03-12 NOTE — Telephone Encounter (Signed)
Daughter called to say that the patient is in the hospital and she has been taking off of most of her heart meds. Wanted to make the dr aware and for him to look at her chart. Please advise

## 2023-03-12 NOTE — Progress Notes (Addendum)
PROGRESS NOTE                                                                                                                                                                                                             Patient Demographics:    Rhonda Marshall, is a 72 y.o. female, DOB - 12-17-1950, HYQ:[id number]  Outpatient Primary MD for the patient is Knox Royalty, MD    LOS - 2  Admit date - 03/10/2023    Chief Complaint  Patient presents with   Fever       Brief Narrative (HPI from H&P)   72 y.o. female with medical history significant of persistent atrial fibrillation on Eliquis, chronic systolic heart failure, LBBB, right carotid artery stenosis, laryngeal cancer, hyperparathyroidism, rheumatoid arthritis on immunosuppressive's, anxiety who presents with fever and weakness about 2 days, in the ER was found to be septic due to UTI and pneumonia, she was hypotensive along with an A-fib RVR.  She was admitted for further care.   Subjective:   Patient in bed, appears comfortable, denies any headache, no fever, no chest pain or pressure, no shortness of breath , no abdominal pain. No new focal weakness.   Assessment  & Plan :    Sepsis present on admission caused by combination of UTI and atypical pneumonia on CT chest. she currently on empiric antibiotics which will be continued, IV fluid bolus and maintenance continued, sepsis pathophysiology gradually improving, follow cultures, encouraged to sit in chair use I-S and flutter valve for pulmonary toiletry.  Bladder scans for complete voiding, add low-dose Solu-Medrol for blood pressure support.  Clinically much improved on 03/12/2023 continue to monitor.   Hyponatremia - Mild.  Secondary to sepsis and dehydration, continue gentle hydration and monitor, urine sodium is quite low however there is a small chance she could be developing SIADH but we will monitor  closely.  Metabolic acidosis Secondary to hypotension and AKI.  Hydrate and monitor.  AKI (acute kidney injury) (HCC)   - Due to sepsis and dehydration, hold Entresto, hydrate and monitor.  Hypotension - Dehydration & AKI  - Hydrate, blood pressure improving.  Continue to hold Entresto.  Hyperthyroidism  - Continue methimazole, stable TSH and free T4.  Chronic systolic CHF (congestive heart failure) (HCC)  - Last echocardiogram few months ago shows EF of 40%, with  global hypokinesis, currently severely dehydrated, hydrate with caution and monitor, at risk for developing fluid overload, monitor closely.  Entresto held due to hypotension, beta-blocker continued as tolerated by blood pressure and monitor.  Chronic A-fib, in RVR due to sepsis - Continue Eliquis, beta-blocker, 2 doses of digoxin, hydrate and monitor.  Mild cognitive impairment.  At risk for delirium, per family previous hospitalizations she was confused, monitor.  DM. type II.  ISS.  She is on Jardiance, for now hold due to UTI, continue oral bicarb supplementation.  Beta hydroxybutyrate was slightly elevated, avoid Jardiance in the future.  CBG (last 3)  No results for input(s): "GLUCAP" in the last 72 hours.       Condition - Extremely Guarded  Family Communication  :  daughter in law Elease Hashimoto [phone]   Code Status :  Full  Consults  :  None  PUD Prophylaxis :    Procedures  :     TTE 02/04/23 -    1. Left ventricular ejection fraction, by estimation, is 35 to 40%. The left ventricle has moderately decreased function. The left ventricle  demonstrates global hypokinesis. Left ventricular diastolic function could not be evaluated.   2. Right ventricular systolic function is normal. The right ventricular size is normal. There is normal pulmonary artery systolic pressure. The estimated right ventricular systolic pressure is 33.5 mmHg.   3. Left atrial size was mildly dilated.   4. The mitral valve is grossly  normal. Moderate mitral valve regurgitation. No evidence of mitral stenosis.   5. The aortic valve is tricuspid. Aortic valve regurgitation is not visualized. No aortic stenosis is present.   6. The inferior vena cava is normal in size with greater than 50% respiratory variability, suggesting right atrial pressure of 3 mmHg.    CT - 1. Patchy infiltrates throughout the lungs likely representing multifocal pneumonia. 2. Incidental heterogeneous and enlarged thyroid. Recommend non-emergent thyroid ultrasound. Reference: J Am Coll Radiol. 2015 Feb;12(2): 143-50 3. Nonspecific gallbladder wall thickening and edema. No stones. Consider ultrasound for further evaluation. 4. Diffuse pancreatic ductal dilatation with evidence of pancreatic atrophy. This could represent early acute or chronic pancreatitis but an occult obstructing lesion is not excluded. Consider follow-up with elective MRCP. 5. Aortic atherosclerosis. 6. Cardiac enlargement      Disposition Plan  :    Status is: Inpatient  DVT Prophylaxis  :    Place TED hose Start: 03/11/23 0827 apixaban (ELIQUIS) tablet 5 mg     Lab Results  Component Value Date   PLT 169 03/12/2023    Diet :  Diet Order             DIET DYS 3 Room service appropriate? No; Fluid consistency: Thin  Diet effective now                    Inpatient Medications  Scheduled Meds:  apixaban  5 mg Oral BID   azithromycin  500 mg Oral Daily   methimazole  5 mg Oral Daily   methylPREDNISolone (SOLU-MEDROL) injection  40 mg Intravenous Q24H   metoprolol tartrate  100 mg Oral BID   sodium bicarbonate  650 mg Oral TID   Continuous Infusions:  cefTRIAXone (ROCEPHIN)  IV 1 g (03/11/23 0836)   PRN Meds:.acetaminophen, ALPRAZolam, diltiazem, oxyCODONE-acetaminophen **AND** oxyCODONE    Objective:   Vitals:   03/12/23 0000 03/12/23 0026 03/12/23 0348 03/12/23 1013  BP: (!) 143/93  125/81 137/60  Pulse: 99 99 71 87  Resp: 19  18 (!) 25  Temp: (!)  97.5 F (36.4 C) (!) 97.5 F (36.4 C) (!) 97 F (36.1 C) 97.9 F (36.6 C)  TempSrc: Oral Oral Oral   SpO2: 94% 94% 99%   Weight:   58 kg   Height:        Wt Readings from Last 3 Encounters:  03/12/23 58 kg  02/16/23 52.2 kg  01/07/23 53 kg     Intake/Output Summary (Last 24 hours) at 03/12/2023 1042 Last data filed at 03/11/2023 1855 Gross per 24 hour  Intake --  Output 450 ml  Net -450 ml     Physical Exam  Awake Alert, No new F.N deficits, Normal affect [place].AT,PERRAL Supple Neck, No JVD,   Symmetrical Chest wall movement, Good air movement bilaterally, coarse basilar B sounds iRRR,No Gallops,Rubs or new Murmurs,  +ve B.Sounds, Abd Soft, No tenderness,   No Cyanosis, Clubbing or edema       Data Review:    Recent Labs  Lab 03/10/23 1330 03/11/23 0755 03/12/23 0325  WBC 11.7* 10.2 5.6  HGB 12.2 11.7* 10.9*  HCT 35.5* 35.6* 32.4*  PLT 188 197 169  MCV 93.7 94.4 92.8  MCH 32.2 31.0 31.2  MCHC 34.4 32.9 33.6  RDW 14.2 14.6 14.5  LYMPHSABS 0.8 1.5 0.5*  MONOABS 0.7 1.0 0.2  EOSABS 0.0 0.1 0.0  BASOSABS 0.0 0.0 0.0    Recent Labs  Lab 03/10/23 1330 03/11/23 0755 03/12/23 0325  NA 125* 134* 129*  K 4.8 4.3 4.8  CL 96* 97* 100  CO2 18* 21* 19*  ANIONGAP 11 16* 10  GLUCOSE 106* 102* 164*  BUN 21 17 20   CREATININE 1.28* 1.37* 1.34*  AST 26 43* 46*  ALT 17 29 33  ALKPHOS 238* 246* 235*  BILITOT 0.6 0.6 0.8  ALBUMIN 3.7 2.8* 2.7*  CRP  --  21.9* 17.5*  PROCALCITON  --  1.59 1.14  LATICACIDVEN 1.3  --   --   INR 1.5*  --   --   TSH 1.089 0.912  --   BNP  --  2,232.7* 4,293.0*  MG  --  1.9 2.0  CALCIUM 9.9 9.5 8.7*      Recent Labs  Lab 03/10/23 1330 03/11/23 0755 03/12/23 0325  CRP  --  21.9* 17.5*  PROCALCITON  --  1.59 1.14  LATICACIDVEN 1.3  --   --   INR 1.5*  --   --   TSH 1.089 0.912  --   BNP  --  2,232.7* 4,293.0*  MG  --  1.9 2.0  CALCIUM 9.9 9.5 8.7*     Recent Labs    03/10/23 1330 03/11/23 0755  TSH 1.089 0.912   FREET4  --  0.93  T3FREE 3.6  --     Radiology Reports DG Chest Port 1 View  Result Date: 03/12/2023 CLINICAL DATA:  Shortness of breath EXAM: PORTABLE CHEST 1 VIEW COMPARISON:  Chest radiograph 1 day prior and CT chest 2 days prior FINDINGS: The cardiomediastinal silhouette is stable. Patchy opacities in both lower lobes, right worse than left, are worsened in the interim. There is no pleural effusion or pneumothorax There is no acute osseous abnormality. IMPRESSION: Worsening opacities in both lower lobes, right worse than left, likely reflecting infection. Electronically Signed   By: Lesia Hausen M.D.   On: 03/12/2023 08:42   DG Chest Port 1 View  Result Date: 03/11/2023 CLINICAL DATA:  Fever and shortness of breath. EXAM:  PORTABLE CHEST 1 VIEW COMPARISON:  03/10/2023 FINDINGS: There is mild cardiac enlargement. Diffusely increased interstitial markings are noted concerning for interstitial edema. Persistent bilateral lower lobe peribronchovascular airspace opacities. No signs of pleural effusion. Visualized osseous structures are unremarkable. IMPRESSION: 1. No change from previous exam. 2. Cardiomegaly and interstitial edema. 3. Persistent bilateral lower lobe peribronchovascular airspace opacities compatible with multifocal pneumonia. Electronically Signed   By: Signa Kell M.D.   On: 03/11/2023 06:41   US Abdomen Limited RUQ (LIVER/GB)  Result Date: 03/10/2023 CLINICAL DATA:  Acute pancreatitis. EXAM: ULTRASOUND ABDOMEN LIMITED RIGHT UPPER QUADRANT COMPARISON:  CT 03/10/2023 FINDINGS: Gallbladder: No gallstones or sludge. Gallbladder wall is mildly thickened at 3.8 mm. Murphy's sign is negative. Common bile duct: Diameter: 7 mm, normal Liver: No focal lesion identified. Within normal limits in parenchymal echogenicity. Portal vein is patent on color Doppler imaging with normal direction of blood flow towards the liver. Other: Dilated pancreatic duct at 5 mm. IMPRESSION: 1. Mild gallbladder  wall thickening. No evidence of cholelithiasis or acute cholecystitis. 2. Pancreatic ductal dilatation, nonspecific. Electronically Signed   By: Burman Nieves M.D.   On: 03/10/2023 22:47   CT CHEST ABDOMEN PELVIS W CONTRAST  Result Date: 03/10/2023 CLINICAL DATA:  Sepsis. EXAM: CT CHEST, ABDOMEN, AND PELVIS WITH CONTRAST TECHNIQUE: Multidetector CT imaging of the chest, abdomen and pelvis was performed following the standard protocol during bolus administration of intravenous contrast. RADIATION DOSE REDUCTION: This exam was performed according to the departmental dose-optimization program which includes automated exposure control, adjustment of the mA and/or kV according to patient size and/or use of iterative reconstruction technique. CONTRAST:  85mL OMNIPAQUE IOHEXOL 350 MG/ML SOLN COMPARISON:  Chest radiograph 03/10/2023.  CT abdomen 10/14/2019 FINDINGS: CT CHEST FINDINGS Cardiovascular: Mild cardiac enlargement. No pericardial effusions. Normal caliber thoracic aorta. No dissection. Calcification of the aorta and coronary arteries. 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 Esophagus is decompressed. Scattered lymph nodes are not pathologically enlarged. Lungs/Pleura: Patchy and confluent airspace infiltrates throughout both lungs. Changes likely to represent multifocal pneumonia. No pleural effusions. No pneumothorax. Airways are patent. Musculoskeletal: No chest wall mass or suspicious bone lesions identified. CT ABDOMEN PELVIS FINDINGS Hepatobiliary: Mild diffuse fatty infiltration of the liver. No focal lesions. Gallbladder wall is thickened and edematous. No stones identified. Changes could indicate cholecystitis or may be related to liver disease. Consider correlation with ultrasound. Pancreas: Diffuse pancreatic ductal dilatation. Mild parenchymal atrophy. No obstructing stone or lesion identified. No peripancreatic edema or collections.  Changes could be due to early acute or chronic pancreatitis or to an occult obstructing lesion or stone. Consider MRCP for further evaluation. Spleen: Normal in size without focal abnormality. Adrenals/Urinary Tract: No adrenal gland nodules. Kidneys are symmetrical. No hydronephrosis or hydroureter. Bladder is normal. Stomach/Bowel: Stomach, small bowel, and colon are not abnormally distended. No wall thickening or inflammatory changes. Stool throughout the colon. Colonic diverticulosis without evidence of acute diverticulitis. Appendix is normal. Vascular/Lymphatic: Aortic atherosclerosis. No enlarged abdominal or pelvic lymph nodes. Reproductive: Uterus and bilateral adnexa are unremarkable. Other: No abdominal wall hernia or abnormality. No abdominopelvic ascites. Musculoskeletal: Postoperative right hip hemiarthroplasty. Degenerative changes in the spine. IMPRESSION: 1. Patchy infiltrates throughout the lungs likely representing multifocal pneumonia. 2. Incidental heterogeneous and enlarged thyroid. Recommend non-emergent thyroid ultrasound. Reference: J Am Coll Radiol. 2015 Feb;12(2): 143-50 3. Nonspecific gallbladder wall thickening and edema. No stones. Consider ultrasound for further evaluation. 4. Diffuse pancreatic ductal dilatation with evidence of pancreatic atrophy. This could  represent early acute or chronic pancreatitis but an occult obstructing lesion is not excluded. Consider follow-up with elective MRCP. 5. Aortic atherosclerosis. 6. Cardiac enlargement. Electronically Signed   By: Burman Nieves M.D.   On: 03/10/2023 18:23   DG Chest Port 1 View  Result Date: 03/10/2023 CLINICAL DATA:  Fever for 2 days.  Possible sepsis. EXAM: PORTABLE CHEST 1 VIEW COMPARISON:  01/18/2022 FINDINGS: Midline trachea. Mild cardiomegaly. Atherosclerosis in the transverse aorta. No pleural effusion or pneumothorax. Moderate pulmonary interstitial thickening is felt to be slightly increased. No lobar  consolidation. IMPRESSION: Moderate pulmonary interstitial thickening, felt to be slightly increased. This could be related to smoking/chronic bronchitis (ex-smoker quitting 3 years ago). However, viral or atypical/mycoplasma pneumonia could look similar. Aortic Atherosclerosis (ICD10-I70.0). Electronically Signed   By: Jeronimo Greaves M.D.   On: 03/10/2023 15:09      Signature  -   Susa Raring M.D on 03/12/2023 at 10:42 AM   -  To page go to [email or web address]

## 2023-03-12 NOTE — Telephone Encounter (Signed)
Will forward this message from the daughter to Dr. Antoine Poche.  Daughter called to say that the patient is in the hospital and she has been taking off of most of her heart meds.   Wanted to make the dr aware and for him to look at her chart.

## 2023-03-12 NOTE — Progress Notes (Signed)
Occupational Therapy Treatment Patient Details Name: Rhonda Marshall MRN: [id number] DOB: 04/04/1951 Today's Date: 03/12/2023   History of present illness 72 y.o. female  who presents 03/10/23 with fever and weakness. +sepsis due to pna or UTI; hypotension; AKI; PMH significant of atrial fibrillation on Eliquis, chronic systolic heart failure EF 30%, LBBB, right carotid artery stenosis, laryngeal cancer, hyperparathyroidism, rheumatoid arthritis on immunosuppressive's, anxiety   OT comments  Pt progressing well in OT session, ambulating and completing ADLs with Supervision. Pt did have a spike in HR to 135-142 bpm during hall ambulation that seemed consistent, MD notified. Discussed with Pt and family the purpose of pacemakers as they were curious of what it does, advised pt to followup with MD to get a more detailed understanding of the pacemaker after giving the pt a general idea of what pacemakers are used for. OT to continue to progress pt as able, No post acute follow-up recommended at this time.       If plan is discharge home, recommend the following:  Assistance with cooking/housework   Equipment Recommendations  None recommended by OT    Recommendations for Other Services      Precautions / Restrictions Precautions Precautions: Fall Restrictions Weight Bearing Restrictions: No       Mobility Bed Mobility Overal bed mobility: Needs Assistance Bed Mobility: Sit to Supine, Supine to Sit     Supine to sit: Supervision, Used rails Sit to supine: Supervision, Used rails        Transfers Overall transfer level: Needs assistance Equipment used: Rolling walker (2 wheels) Transfers: Sit to/from Stand Sit to Stand: Supervision                 Balance Overall balance assessment: Needs assistance Sitting-balance support: No upper extremity supported, Feet supported Sitting balance-Leahy Scale: Fair     Standing balance support: No upper extremity supported, During  functional activity                               ADL either performed or assessed with clinical judgement   ADL Overall ADL's : Needs assistance/impaired         Upper Body Bathing: Standing;Supervision/ safety   Lower Body Bathing: Supervison/ safety       Lower Body Dressing: Supervision/safety;Sitting/lateral leans               Functional mobility during ADLs: Supervision/safety;Rolling walker (2 wheels)      Extremity/Trunk Assessment              Vision       Perception     Praxis      Cognition Arousal: Alert Behavior During Therapy: WFL for tasks assessed/performed Overall Cognitive Status: Within Functional Limits for tasks assessed                                 General Comments: Tangenital speech, Pt reports her cognition seems okay but likely misinterprets things due to poor hearing        Exercises      Shoulder Instructions       General Comments HR up to 135-142 bpm with hall ambulation, Sp02 90-93% on RA at rest and during mobility    Pertinent Vitals/ Pain       Pain Assessment Pain Assessment: Faces Faces Pain Scale: Hurts little more Pain Location: back Pain Descriptors /  Indicators: Aching, Discomfort Pain Intervention(s): Patient requesting pain meds-RN notified, Limited activity within patient's tolerance, Repositioned, Monitored during session  Home Living                                          Prior Functioning/Environment              Frequency  Min 1X/week        Progress Toward Goals  OT Goals(current goals can now be found in the care plan section)  Progress towards OT goals: Progressing toward goals  Acute Rehab OT Goals Patient Stated Goal: To get up and moving again OT Goal Formulation: With patient Time For Goal Achievement: 03/25/23 Potential to Achieve Goals: Good  Plan      Co-evaluation                 AM-PAC OT "6 Clicks"  Daily Activity     Outcome Measure   Help from another person eating meals?: None Help from another person taking care of personal grooming?: A Little Help from another person toileting, which includes using toliet, bedpan, or urinal?: A Little Help from another person bathing (including washing, rinsing, drying)?: A Little Help from another person to put on and taking off regular upper body clothing?: A Little Help from another person to put on and taking off regular lower body clothing?: A Little 6 Click Score: 19    End of Session Equipment Utilized During Treatment: Gait belt;Rolling walker (2 wheels)  OT Visit Diagnosis: Muscle weakness (generalized) (M62.81)   Activity Tolerance Patient tolerated treatment well   Patient Left in bed;with call bell/phone within reach;with family/visitor present   Nurse Communication Mobility status        Time: 1620-1650 OT Time Calculation (min): 30 min  Charges: OT General Charges $OT Visit: 1 Visit OT Treatments $Therapeutic Activity: 23-37 mins  03/12/2023  AB, OTR/L  Acute Rehabilitation Services  Office: [phone]   Tristan Schroeder 03/12/2023, 5:36 PM

## 2023-03-13 ENCOUNTER — Inpatient Hospital Stay (HOSPITAL_COMMUNITY): Payer: 59

## 2023-03-13 DIAGNOSIS — A419 Sepsis, unspecified organism: Secondary | ICD-10-CM | POA: Diagnosis not present

## 2023-03-13 LAB — CBC WITH DIFFERENTIAL/PLATELET
Abs Immature Granulocytes: 0.11 10*3/uL — ABNORMAL HIGH (ref 0.00–0.07)
Basophils Absolute: 0 10*3/uL (ref 0.0–0.1)
Basophils Relative: 0 %
Eosinophils Absolute: 0 10*3/uL (ref 0.0–0.5)
Eosinophils Relative: 0 %
HCT: 31.2 % — ABNORMAL LOW (ref 36.0–46.0)
Hemoglobin: 10.5 g/dL — ABNORMAL LOW (ref 12.0–15.0)
Immature Granulocytes: 1 %
Lymphocytes Relative: 6 %
Lymphs Abs: 0.7 10*3/uL (ref 0.7–4.0)
MCH: 31.1 pg (ref 26.0–34.0)
MCHC: 33.7 g/dL (ref 30.0–36.0)
MCV: 92.3 fL (ref 80.0–100.0)
Monocytes Absolute: 0.3 10*3/uL (ref 0.1–1.0)
Monocytes Relative: 3 %
Neutro Abs: 10.2 10*3/uL — ABNORMAL HIGH (ref 1.7–7.7)
Neutrophils Relative %: 90 %
Platelets: 194 10*3/uL (ref 150–400)
RBC: 3.38 MIL/uL — ABNORMAL LOW (ref 3.87–5.11)
RDW: 14.6 % (ref 11.5–15.5)
WBC: 11.3 10*3/uL — ABNORMAL HIGH (ref 4.0–10.5)
nRBC: 0 % (ref 0.0–0.2)

## 2023-03-13 LAB — COMPREHENSIVE METABOLIC PANEL
ALT: 33 U/L (ref 0–44)
AST: 38 U/L (ref 15–41)
Albumin: 2.7 g/dL — ABNORMAL LOW (ref 3.5–5.0)
Alkaline Phosphatase: 223 U/L — ABNORMAL HIGH (ref 38–126)
Anion gap: 10 (ref 5–15)
BUN: 26 mg/dL — ABNORMAL HIGH (ref 8–23)
CO2: 20 mmol/L — ABNORMAL LOW (ref 22–32)
Calcium: 8.7 mg/dL — ABNORMAL LOW (ref 8.9–10.3)
Chloride: 101 mmol/L (ref 98–111)
Creatinine, Ser: 1.32 mg/dL — ABNORMAL HIGH (ref 0.44–1.00)
GFR, Estimated: 43 mL/min — ABNORMAL LOW (ref >60)
Glucose, Bld: 126 mg/dL — ABNORMAL HIGH (ref 70–99)
Potassium: 5 mmol/L (ref 3.5–5.1)
Sodium: 131 mmol/L — ABNORMAL LOW (ref 135–145)
Total Bilirubin: 0.7 mg/dL (ref 0.3–1.2)
Total Protein: 5.9 g/dL — ABNORMAL LOW (ref 6.5–8.1)

## 2023-03-13 LAB — GLUCOSE, CAPILLARY
Glucose-Capillary: 167 mg/dL — ABNORMAL HIGH (ref 70–99)
Glucose-Capillary: 138 mg/dL — ABNORMAL HIGH (ref 70–99)
Glucose-Capillary: 135 mg/dL — ABNORMAL HIGH (ref 70–99)
Glucose-Capillary: 144 mg/dL — ABNORMAL HIGH (ref 70–99)

## 2023-03-13 LAB — MAGNESIUM: Magnesium: 1.8 mg/dL (ref 1.7–2.4)

## 2023-03-13 LAB — C-REACTIVE PROTEIN: CRP: 9.7 mg/dL — ABNORMAL HIGH (ref <1.0)

## 2023-03-13 LAB — PROCALCITONIN: Procalcitonin: 0.69 ng/mL

## 2023-03-13 LAB — BRAIN NATRIURETIC PEPTIDE: B Natriuretic Peptide: 3181.3 pg/mL — ABNORMAL HIGH (ref 0.0–100.0)

## 2023-03-13 MED ORDER — FUROSEMIDE 10 MG/ML IJ SOLN
20.0000 mg | Freq: Once | INTRAMUSCULAR | Status: AC
  Administered 2023-03-13: 20 mg via INTRAVENOUS
  Filled 2023-03-13: qty 2

## 2023-03-13 MED ORDER — SODIUM ZIRCONIUM CYCLOSILICATE 10 G PO PACK
10.0000 g | PACK | Freq: Once | ORAL | Status: AC
  Administered 2023-03-13: 10 g via ORAL
  Filled 2023-03-13: qty 1

## 2023-03-13 NOTE — Progress Notes (Signed)
PROGRESS NOTE                                                                                                                                                                                                             Patient Demographics:    Rhonda Marshall, is a 72 y.o. female, DOB - 1950/12/07, ZOX:[id number]  Outpatient Primary MD for the patient is Knox Royalty, MD    LOS - 3  Admit date - 03/10/2023    Chief Complaint  Patient presents with   Fever       Brief Narrative (HPI from H&P)   72 y.o. female with medical history significant of persistent atrial fibrillation on Eliquis, chronic systolic heart failure, LBBB, right carotid artery stenosis, laryngeal cancer, hyperparathyroidism, rheumatoid arthritis on immunosuppressive's, anxiety who presents with fever and weakness about 2 days, in the ER was found to be septic due to UTI and pneumonia, she was hypotensive along with an A-fib RVR.  She was admitted for further care.   Subjective:   Patient in bed, appears comfortable, denies any headache, no fever, no chest pain or pressure, improved cough and shortness of breath , no abdominal pain. No focal weakness.   Assessment  & Plan :    Sepsis present on admission caused by combination of UTI and atypical pneumonia on CT chest. she currently on empiric antibiotics which will be continued, IV fluid bolus and maintenance continued, sepsis pathophysiology gradually improving, follow cultures, encouraged to sit in chair use I-S and flutter valve for pulmonary toiletry.  Bladder scans for complete voiding, add low-dose Solu-Medrol for blood pressure support.  Clinically much improved on 03/12/2023 continue to monitor.   Hyponatremia - Mild.  Secondary to sepsis and dehydration, continue gentle hydration and monitor, urine sodium is quite low however there is a small chance she could be developing SIADH but we will monitor  closely.  Metabolic acidosis Secondary to hypotension and AKI.  Hydrate and monitor.  AKI (acute kidney injury) (HCC)   - Due to sepsis and dehydration, hold Entresto, hydrate and monitor.  Hypotension - Dehydration & AKI  - Hydrate, blood pressure improving.  Continue to hold Entresto.  Hyperthyroidism  - Continue methimazole, stable TSH and free T4.  Noted nonspecific thyroid findings on CT chest, she requires outpatient follow-up with endocrine for possible ultrasound of her  thyroid to be arranged by PCP.  Chronic systolic CHF (congestive heart failure) (HCC)  - Last echocardiogram few months ago shows EF of 40%, with global hypokinesis, currently severely dehydrated, hydrate with caution and monitor, at risk for developing fluid overload, monitor closely.  Entresto held due to hypotension, beta-blocker continued as tolerated by blood pressure and monitor.  Chronic A-fib, in RVR due to sepsis - Continue Eliquis, beta-blocker, 2 doses of digoxin, hydrate and monitor.  Improving rate.  Mild cognitive impairment.  At risk for delirium, per family previous hospitalizations she was confused, monitor.  DM. type II.  ISS.  She is on Jardiance, for now hold due to UTI, continue oral bicarb supplementation.  Beta hydroxybutyrate was slightly elevated, avoid Jardiance in the future.  CBG (last 3)  Recent Labs    03/12/23 1720 03/12/23 2108 03/13/23 0750  GLUCAP 175* 171* 167*         Condition - Fair  Family Communication  :  daughter in law Elease Hashimoto [phone] 03/11/2023 and 03/13/2023  Code Status :  Full  Consults  :  None  PUD Prophylaxis :    Procedures  :     TTE 02/04/23 -    1. Left ventricular ejection fraction, by estimation, is 35 to 40%. The left ventricle has moderately decreased function. The left ventricle  demonstrates global hypokinesis. Left ventricular diastolic function could not be evaluated.   2. Right ventricular systolic function is normal. The right  ventricular size is normal. There is normal pulmonary artery systolic pressure. The estimated right ventricular systolic pressure is 33.5 mmHg.   3. Left atrial size was mildly dilated.   4. The mitral valve is grossly normal. Moderate mitral valve regurgitation. No evidence of mitral stenosis.   5. The aortic valve is tricuspid. Aortic valve regurgitation is not visualized. No aortic stenosis is present.   6. The inferior vena cava is normal in size with greater than 50% respiratory variability, suggesting right atrial pressure of 3 mmHg.    CT - 1. Patchy infiltrates throughout the lungs likely representing multifocal pneumonia. 2. Incidental heterogeneous and enlarged thyroid. Recommend non-emergent thyroid ultrasound. Reference: J Am Coll Radiol. 2015 Feb;12(2): 143-50 3. Nonspecific gallbladder wall thickening and edema. No stones. Consider ultrasound for further evaluation. 4. Diffuse pancreatic ductal dilatation with evidence of pancreatic atrophy. This could represent early acute or chronic pancreatitis but an occult obstructing lesion is not excluded. Consider follow-up with elective MRCP. 5. Aortic atherosclerosis. 6. Cardiac enlargement      Disposition Plan  :    Status is: Inpatient  DVT Prophylaxis  :    Place TED hose Start: 03/11/23 0827 apixaban (ELIQUIS) tablet 5 mg     Lab Results  Component Value Date   PLT 194 03/13/2023    Diet :  Diet Order             DIET DYS 3 Room service appropriate? No; Fluid consistency: Thin  Diet effective now                    Inpatient Medications  Scheduled Meds:  apixaban  5 mg Oral BID   azithromycin  500 mg Oral Daily   insulin aspart  0-5 Units Subcutaneous QHS   insulin aspart  0-9 Units Subcutaneous TID WC   methimazole  5 mg Oral Daily   methylPREDNISolone (SOLU-MEDROL) injection  40 mg Intravenous Q24H   metoprolol tartrate  100 mg Oral BID   sodium zirconium cyclosilicate  10 g Oral Once   Continuous  Infusions:   PRN Meds:.acetaminophen, ALPRAZolam, diltiazem, oxyCODONE-acetaminophen **AND** [DISCONTINUED] oxyCODONE    Objective:   Vitals:   03/12/23 2356 03/13/23 0400 03/13/23 0559 03/13/23 0748  BP: 118/67 129/80 134/72 127/86  Pulse: 65 80 72 70  Resp: 20 14 (!) 22 20  Temp: 98 F (36.7 C) 98.2 F (36.8 C) 97.8 F (36.6 C) (!) 97.5 F (36.4 C)  TempSrc: Oral Oral Oral Oral  SpO2: 92% 95% 94% 96%  Weight:      Height:        Wt Readings from Last 3 Encounters:  03/12/23 58 kg  02/16/23 52.2 kg  01/07/23 53 kg     Intake/Output Summary (Last 24 hours) at 03/13/2023 1031 Last data filed at 03/13/2023 0850 Gross per 24 hour  Intake 651.81 ml  Output --  Net 651.81 ml     Physical Exam  Awake Alert, No new F.N deficits, Normal affect [place].AT,PERRAL Supple Neck, No JVD,   Symmetrical Chest wall movement, Good air movement bilaterally, coarse basilar B sounds iRRR,No Gallops,Rubs or new Murmurs,  +ve B.Sounds, Abd Soft, No tenderness,   No Cyanosis, Clubbing or edema       Data Review:    Recent Labs  Lab 03/10/23 1330 03/11/23 0755 03/12/23 0325 03/13/23 0326  WBC 11.7* 10.2 5.6 11.3*  HGB 12.2 11.7* 10.9* 10.5*  HCT 35.5* 35.6* 32.4* 31.2*  PLT 188 197 169 194  MCV 93.7 94.4 92.8 92.3  MCH 32.2 31.0 31.2 31.1  MCHC 34.4 32.9 33.6 33.7  RDW 14.2 14.6 14.5 14.6  LYMPHSABS 0.8 1.5 0.5* 0.7  MONOABS 0.7 1.0 0.2 0.3  EOSABS 0.0 0.1 0.0 0.0  BASOSABS 0.0 0.0 0.0 0.0    Recent Labs  Lab 03/10/23 1330 03/11/23 0755 03/12/23 0325 03/13/23 0326  NA 125* 134* 129* 131*  K 4.8 4.3 4.8 5.0  CL 96* 97* 100 101  CO2 18* 21* 19* 20*  ANIONGAP 11 16* 10 10  GLUCOSE 106* 102* 164* 126*  BUN 21 17 20  26*  CREATININE 1.28* 1.37* 1.34* 1.32*  AST 26 43* 46* 38  ALT 17 29 33 33  ALKPHOS 238* 246* 235* 223*  BILITOT 0.6 0.6 0.8 0.7  ALBUMIN 3.7 2.8* 2.7* 2.7*  CRP  --  21.9* 17.5* 9.7*  PROCALCITON  --  1.59 1.14 0.69  LATICACIDVEN 1.3  --   --    --   INR 1.5*  --   --   --   TSH 1.089 0.912  --   --   HGBA1C  --   --  6.1*  --   BNP  --  2,232.7* 4,293.0* 3,181.3*  MG  --  1.9 2.0 1.8  CALCIUM 9.9 9.5 8.7* 8.7*      Recent Labs  Lab 03/10/23 1330 03/11/23 0755 03/12/23 0325 03/13/23 0326  CRP  --  21.9* 17.5* 9.7*  PROCALCITON  --  1.59 1.14 0.69  LATICACIDVEN 1.3  --   --   --   INR 1.5*  --   --   --   TSH 1.089 0.912  --   --   HGBA1C  --   --  6.1*  --   BNP  --  2,232.7* 4,293.0* 3,181.3*  MG  --  1.9 2.0 1.8  CALCIUM 9.9 9.5 8.7* 8.7*     Recent Labs    03/10/23 1330 03/11/23 0755  TSH 1.089 0.912  FREET4  --  0.93  T3FREE 3.6  --     Radiology Reports DG Chest Port 1 View  Result Date: 03/13/2023 CLINICAL DATA:  Shortness of breath. EXAM: PORTABLE CHEST 1 VIEW COMPARISON:  03/12/2023 FINDINGS: Stable mild cardiac enlargement. Small pleural effusions with blunting of the costophrenic angles. Increase interstitial markings compatible with mild edema. Similar appearance bibasilar pulmonary opacities. IMPRESSION: 1. Persistent bibasilar pulmonary opacities. 2. Small pleural effusions with increase interstitial markings. Electronically Signed   By: Signa Kell M.D.   On: 03/13/2023 07:48   DG Chest Port 1 View  Result Date: 03/12/2023 CLINICAL DATA:  Shortness of breath EXAM: PORTABLE CHEST 1 VIEW COMPARISON:  Chest radiograph 1 day prior and CT chest 2 days prior FINDINGS: The cardiomediastinal silhouette is stable. Patchy opacities in both lower lobes, right worse than left, are worsened in the interim. There is no pleural effusion or pneumothorax There is no acute osseous abnormality. IMPRESSION: Worsening opacities in both lower lobes, right worse than left, likely reflecting infection. Electronically Signed   By: Lesia Hausen M.D.   On: 03/12/2023 08:42   DG Chest Port 1 View  Result Date: 03/11/2023 CLINICAL DATA:  Fever and shortness of breath. EXAM: PORTABLE CHEST 1 VIEW COMPARISON:  03/10/2023  FINDINGS: There is mild cardiac enlargement. Diffusely increased interstitial markings are noted concerning for interstitial edema. Persistent bilateral lower lobe peribronchovascular airspace opacities. No signs of pleural effusion. Visualized osseous structures are unremarkable. IMPRESSION: 1. No change from previous exam. 2. Cardiomegaly and interstitial edema. 3. Persistent bilateral lower lobe peribronchovascular airspace opacities compatible with multifocal pneumonia. Electronically Signed   By: Signa Kell M.D.   On: 03/11/2023 06:41   US Abdomen Limited RUQ (LIVER/GB)  Result Date: 03/10/2023 CLINICAL DATA:  Acute pancreatitis. EXAM: ULTRASOUND ABDOMEN LIMITED RIGHT UPPER QUADRANT COMPARISON:  CT 03/10/2023 FINDINGS: Gallbladder: No gallstones or sludge. Gallbladder wall is mildly thickened at 3.8 mm. Murphy's sign is negative. Common bile duct: Diameter: 7 mm, normal Liver: No focal lesion identified. Within normal limits in parenchymal echogenicity. Portal vein is patent on color Doppler imaging with normal direction of blood flow towards the liver. Other: Dilated pancreatic duct at 5 mm. IMPRESSION: 1. Mild gallbladder wall thickening. No evidence of cholelithiasis or acute cholecystitis. 2. Pancreatic ductal dilatation, nonspecific. Electronically Signed   By: Burman Nieves M.D.   On: 03/10/2023 22:47   CT CHEST ABDOMEN PELVIS W CONTRAST  Result Date: 03/10/2023 CLINICAL DATA:  Sepsis. EXAM: CT CHEST, ABDOMEN, AND PELVIS WITH CONTRAST TECHNIQUE: Multidetector CT imaging of the chest, abdomen and pelvis was performed following the standard protocol during bolus administration of intravenous contrast. RADIATION DOSE REDUCTION: This exam was performed according to the departmental dose-optimization program which includes automated exposure control, adjustment of the mA and/or kV according to patient size and/or use of iterative reconstruction technique. CONTRAST:  85mL OMNIPAQUE IOHEXOL 350  MG/ML SOLN COMPARISON:  Chest radiograph 03/10/2023.  CT abdomen 10/14/2019 FINDINGS: CT CHEST FINDINGS Cardiovascular: Mild cardiac enlargement. No pericardial effusions. Normal caliber thoracic aorta. No dissection. Calcification of the aorta and coronary arteries. 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 Esophagus is decompressed. Scattered lymph nodes are not pathologically enlarged. Lungs/Pleura: Patchy and confluent airspace infiltrates throughout both lungs. Changes likely to represent multifocal pneumonia. No pleural effusions. No pneumothorax. Airways are patent. Musculoskeletal: No chest wall mass or suspicious bone lesions identified. CT ABDOMEN PELVIS FINDINGS Hepatobiliary: Mild diffuse fatty infiltration of the liver. No focal lesions. Gallbladder wall is thickened and  edematous. No stones identified. Changes could indicate cholecystitis or may be related to liver disease. Consider correlation with ultrasound. Pancreas: Diffuse pancreatic ductal dilatation. Mild parenchymal atrophy. No obstructing stone or lesion identified. No peripancreatic edema or collections. Changes could be due to early acute or chronic pancreatitis or to an occult obstructing lesion or stone. Consider MRCP for further evaluation. Spleen: Normal in size without focal abnormality. Adrenals/Urinary Tract: No adrenal gland nodules. Kidneys are symmetrical. No hydronephrosis or hydroureter. Bladder is normal. Stomach/Bowel: Stomach, small bowel, and colon are not abnormally distended. No wall thickening or inflammatory changes. Stool throughout the colon. Colonic diverticulosis without evidence of acute diverticulitis. Appendix is normal. Vascular/Lymphatic: Aortic atherosclerosis. No enlarged abdominal or pelvic lymph nodes. Reproductive: Uterus and bilateral adnexa are unremarkable. Other: No abdominal wall hernia or abnormality. No abdominopelvic ascites.  Musculoskeletal: Postoperative right hip hemiarthroplasty. Degenerative changes in the spine. IMPRESSION: 1. Patchy infiltrates throughout the lungs likely representing multifocal pneumonia. 2. Incidental heterogeneous and enlarged thyroid. Recommend non-emergent thyroid ultrasound. Reference: J Am Coll Radiol. 2015 Feb;12(2): 143-50 3. Nonspecific gallbladder wall thickening and edema. No stones. Consider ultrasound for further evaluation. 4. Diffuse pancreatic ductal dilatation with evidence of pancreatic atrophy. This could represent early acute or chronic pancreatitis but an occult obstructing lesion is not excluded. Consider follow-up with elective MRCP. 5. Aortic atherosclerosis. 6. Cardiac enlargement. Electronically Signed   By: Burman Nieves M.D.   On: 03/10/2023 18:23   DG Chest Port 1 View  Result Date: 03/10/2023 CLINICAL DATA:  Fever for 2 days.  Possible sepsis. EXAM: PORTABLE CHEST 1 VIEW COMPARISON:  01/18/2022 FINDINGS: Midline trachea. Mild cardiomegaly. Atherosclerosis in the transverse aorta. No pleural effusion or pneumothorax. Moderate pulmonary interstitial thickening is felt to be slightly increased. No lobar consolidation. IMPRESSION: Moderate pulmonary interstitial thickening, felt to be slightly increased. This could be related to smoking/chronic bronchitis (ex-smoker quitting 3 years ago). However, viral or atypical/mycoplasma pneumonia could look similar. Aortic Atherosclerosis (ICD10-I70.0). Electronically Signed   By: Jeronimo Greaves M.D.   On: 03/10/2023 15:09      Signature  -   Susa Raring M.D on 03/13/2023 at 10:31 AM   -  To page go to [email or web address]

## 2023-03-13 NOTE — Progress Notes (Signed)
   03/13/23 1401  TOC Brief Assessment  Insurance and Status Reviewed [facility] Medicare  Dual Complete)  Patient has primary care physician Yes Knox Royalty, MD)  Home environment has been reviewed From Home  Prior level of function: independent  Prior/Current Home Services No current home services  Social Determinants of Health Reivew SDOH reviewed no interventions necessary  Readmission risk has been reviewed Yes (21%)  Transition of care needs no transition of care needs at this time

## 2023-03-13 NOTE — Plan of Care (Signed)
  Problem: Education: Goal: Knowledge of General Education information will improve Description: Including pain rating scale, medication(s)/side effects and non-pharmacologic comfort measures Outcome: Progressing   Problem: Nutrition: Goal: Adequate nutrition will be maintained Outcome: Progressing   Problem: Pain Managment: Goal: General experience of comfort will improve Outcome: Progressing   Problem: Safety: Goal: Ability to remain free from injury will improve Outcome: Progressing   Problem: Coping: Goal: Ability to adjust to condition or change in health will improve Outcome: Progressing

## 2023-03-14 DIAGNOSIS — A419 Sepsis, unspecified organism: Secondary | ICD-10-CM | POA: Diagnosis not present

## 2023-03-14 LAB — COMPREHENSIVE METABOLIC PANEL
ALT: 44 U/L (ref 0–44)
AST: 44 U/L — ABNORMAL HIGH (ref 15–41)
Albumin: 2.8 g/dL — ABNORMAL LOW (ref 3.5–5.0)
Alkaline Phosphatase: 211 U/L — ABNORMAL HIGH (ref 38–126)
Anion gap: 12 (ref 5–15)
BUN: 33 mg/dL — ABNORMAL HIGH (ref 8–23)
CO2: 22 mmol/L (ref 22–32)
Calcium: 9.2 mg/dL (ref 8.9–10.3)
Chloride: 97 mmol/L — ABNORMAL LOW (ref 98–111)
Creatinine, Ser: 1.51 mg/dL — ABNORMAL HIGH (ref 0.44–1.00)
GFR, Estimated: 36 mL/min — ABNORMAL LOW (ref >60)
Glucose, Bld: 136 mg/dL — ABNORMAL HIGH (ref 70–99)
Potassium: 5.2 mmol/L — ABNORMAL HIGH (ref 3.5–5.1)
Sodium: 131 mmol/L — ABNORMAL LOW (ref 135–145)
Total Bilirubin: 0.7 mg/dL (ref 0.3–1.2)
Total Protein: 6.1 g/dL — ABNORMAL LOW (ref 6.5–8.1)

## 2023-03-14 LAB — URIC ACID: Uric Acid, Serum: 5.8 mg/dL (ref 2.5–7.1)

## 2023-03-14 LAB — CBC WITH DIFFERENTIAL/PLATELET
Abs Immature Granulocytes: 0.17 10*3/uL — ABNORMAL HIGH (ref 0.00–0.07)
Basophils Absolute: 0 10*3/uL (ref 0.0–0.1)
Basophils Relative: 0 %
Eosinophils Absolute: 0 10*3/uL (ref 0.0–0.5)
Eosinophils Relative: 0 %
HCT: 33.1 % — ABNORMAL LOW (ref 36.0–46.0)
Hemoglobin: 11.1 g/dL — ABNORMAL LOW (ref 12.0–15.0)
Immature Granulocytes: 1 %
Lymphocytes Relative: 5 %
Lymphs Abs: 0.6 10*3/uL — ABNORMAL LOW (ref 0.7–4.0)
MCH: 31.2 pg (ref 26.0–34.0)
MCHC: 33.5 g/dL (ref 30.0–36.0)
MCV: 93 fL (ref 80.0–100.0)
Monocytes Absolute: 0.4 10*3/uL (ref 0.1–1.0)
Monocytes Relative: 3 %
Neutro Abs: 11.9 10*3/uL — ABNORMAL HIGH (ref 1.7–7.7)
Neutrophils Relative %: 91 %
Platelets: 254 10*3/uL (ref 150–400)
RBC: 3.56 MIL/uL — ABNORMAL LOW (ref 3.87–5.11)
RDW: 14.4 % (ref 11.5–15.5)
WBC: 13 10*3/uL — ABNORMAL HIGH (ref 4.0–10.5)
nRBC: 0 % (ref 0.0–0.2)

## 2023-03-14 LAB — OSMOLALITY: Osmolality: 289 mOsm/kg (ref 275–295)

## 2023-03-14 LAB — PROCALCITONIN: Procalcitonin: 0.44 ng/mL

## 2023-03-14 LAB — C-REACTIVE PROTEIN: CRP: 7.7 mg/dL — ABNORMAL HIGH (ref <1.0)

## 2023-03-14 LAB — OSMOLALITY, URINE: Osmolality, Ur: 401 mOsm/kg (ref 300–900)

## 2023-03-14 LAB — SODIUM, URINE, RANDOM: Sodium, Ur: 20 mmol/L

## 2023-03-14 LAB — BRAIN NATRIURETIC PEPTIDE: B Natriuretic Peptide: >4500 pg/mL — ABNORMAL HIGH (ref 0.0–100.0)

## 2023-03-14 LAB — CREATININE, URINE, RANDOM: Creatinine, Urine: 66 mg/dL

## 2023-03-14 LAB — GLUCOSE, CAPILLARY
Glucose-Capillary: 199 mg/dL — ABNORMAL HIGH (ref 70–99)
Glucose-Capillary: 127 mg/dL — ABNORMAL HIGH (ref 70–99)
Glucose-Capillary: 173 mg/dL — ABNORMAL HIGH (ref 70–99)
Glucose-Capillary: 160 mg/dL — ABNORMAL HIGH (ref 70–99)

## 2023-03-14 LAB — MAGNESIUM: Magnesium: 1.8 mg/dL (ref 1.7–2.4)

## 2023-03-14 MED ORDER — ALPRAZOLAM 0.5 MG PO TABS
0.5000 mg | ORAL_TABLET | Freq: Three times a day (TID) | ORAL | Status: DC | PRN
  Administered 2023-03-14 – 2023-03-15 (×3): 0.5 mg via ORAL
  Filled 2023-03-14 (×3): qty 1

## 2023-03-14 MED ORDER — METHYLPREDNISOLONE SODIUM SUCC 40 MG IJ SOLR
20.0000 mg | INTRAMUSCULAR | Status: AC
  Administered 2023-03-14: 20 mg via INTRAVENOUS
  Filled 2023-03-14: qty 1

## 2023-03-14 MED ORDER — LACTATED RINGERS IV SOLN
INTRAVENOUS | Status: AC
  Administered 2023-03-14: 1000 mL via INTRAVENOUS

## 2023-03-14 MED ORDER — SODIUM ZIRCONIUM CYCLOSILICATE 10 G PO PACK
10.0000 g | PACK | Freq: Two times a day (BID) | ORAL | Status: AC
  Administered 2023-03-14 (×2): 10 g via ORAL
  Filled 2023-03-14 (×2): qty 1

## 2023-03-14 MED ORDER — METHYLPREDNISOLONE SODIUM SUCC 40 MG IJ SOLR: 20.0000 mg | INTRAMUSCULAR | Status: DC

## 2023-03-14 MED ORDER — ALPRAZOLAM 0.5 MG PO TABS: 1.0000 mg | ORAL_TABLET | Freq: Two times a day (BID) | ORAL | Status: DC | PRN

## 2023-03-14 NOTE — Plan of Care (Signed)
  Problem: Education: Goal: Knowledge of General Education information will improve Description Including pain rating scale, medication(s)/side effects and non-pharmacologic comfort measures Outcome: Progressing   

## 2023-03-14 NOTE — Plan of Care (Signed)

## 2023-03-14 NOTE — Progress Notes (Signed)
PROGRESS NOTE                                                                                                                                                                                                             Patient Demographics:    Rhonda Marshall, is a 72 y.o. female, DOB - [birth year], UEA:[id number]  Outpatient Primary MD for the patient is Knox Royalty, MD    LOS - 4  Admit date - 03/10/2023    Chief Complaint  Patient presents with   Fever       Brief Narrative (HPI from H&P)   72 y.o. female with medical history significant of persistent atrial fibrillation on Eliquis, chronic systolic heart failure, LBBB, right carotid artery stenosis, laryngeal cancer, hyperparathyroidism, rheumatoid arthritis on immunosuppressive's, anxiety who presents with fever and weakness about 2 days, in the ER was found to be septic due to UTI and pneumonia, she was hypotensive along with an A-fib RVR.  She was admitted for further care.   Subjective:   Patient in bed, appears comfortable, denies any headache, no fever, no chest pain or pressure, no shortness of breath , no abdominal pain. No new focal weakness.   Assessment  & Plan :    Sepsis present on admission caused by combination of UTI and atypical pneumonia on CT chest. she currently on empiric antibiotics which will be continued, IV fluid bolus and maintenance continued, sepsis pathophysiology gradually improving, follow cultures, encouraged to sit in chair use I-S and flutter valve for pulmonary toiletry.  Bladder scans for complete voiding, taper of steroids as blood pressure has stabilized.  Clinically much improved on 03/12/2023 continue to monitor.   Hyponatremia - Mild.  Secondary to sepsis and dehydration, continue gentle hydration and monitor, urine sodium is quite low however there is a small chance she could be developing SIADH but we will monitor  closely.  Metabolic acidosis Secondary to hypotension and AKI.  Hydrate and monitor.  AKI (acute kidney injury) (HCC)   - Due to sepsis and dehydration, hold Entresto, continue IV fluids on 03/14/2023 and monitor.  Hyperkalemia.  Lokelma.    Hypotension - Dehydration & AKI  - Hydrate, blood pressure improving.  Continue to hold Entresto.  Hyperthyroidism  - Continue methimazole, stable TSH and free T4.  Noted nonspecific thyroid findings on CT chest, she  requires outpatient follow-up with endocrine for possible ultrasound of her thyroid to be arranged by PCP.  Chronic systolic CHF (congestive heart failure) (HCC)  - Last echocardiogram few months ago shows EF of 40%, with global hypokinesis, currently severely dehydrated, hydrate with caution and monitor, at risk for developing fluid overload, monitor closely.  Entresto held due to hypotension, beta-blocker continued as tolerated by blood pressure and monitor.  BNP does not correlate with her clinical status.  Chronic A-fib, in RVR due to sepsis - Continue Eliquis, beta-blocker, 2 doses of digoxin, hydrate and monitor.  Improving rate.  Chronic pain and anxiety.  Home pain medication and anxiety medications increased as per family request.  Counseled not to overuse, at risk for overdose and encephalopathy.  Family explained in detail.  Mild cognitive impairment.  At risk for delirium, per family previous hospitalizations she was confused, monitor.    DM. type II.  ISS.  She is on Jardiance, for now hold due to UTI, continue oral bicarb supplementation.  Beta hydroxybutyrate was slightly elevated, avoid Jardiance in the future.  CBG (last 3)  Recent Labs    03/13/23 1612 03/13/23 2115 03/14/23 0852  GLUCAP 135* 144* 199*         Condition - Fair  Family Communication  :  daughter in law Elease Hashimoto [phone] 03/11/2023 and 03/13/2023  Code Status :  Full  Consults  :  None  PUD Prophylaxis :    Procedures  :     TTE 02/04/23  -    1. Left ventricular ejection fraction, by estimation, is 35 to 40%. The left ventricle has moderately decreased function. The left ventricle  demonstrates global hypokinesis. Left ventricular diastolic function could not be evaluated.   2. Right ventricular systolic function is normal. The right ventricular size is normal. There is normal pulmonary artery systolic pressure. The estimated right ventricular systolic pressure is 33.5 mmHg.   3. Left atrial size was mildly dilated.   4. The mitral valve is grossly normal. Moderate mitral valve regurgitation. No evidence of mitral stenosis.   5. The aortic valve is tricuspid. Aortic valve regurgitation is not visualized. No aortic stenosis is present.   6. The inferior vena cava is normal in size with greater than 50% respiratory variability, suggesting right atrial pressure of 3 mmHg.    CT - 1. Patchy infiltrates throughout the lungs likely representing multifocal pneumonia. 2. Incidental heterogeneous and enlarged thyroid. Recommend non-emergent thyroid ultrasound. Reference: J Am Coll Radiol. 2015 Feb;12(2): 143-50 3. Nonspecific gallbladder wall thickening and edema. No stones. Consider ultrasound for further evaluation. 4. Diffuse pancreatic ductal dilatation with evidence of pancreatic atrophy. This could represent early acute or chronic pancreatitis but an occult obstructing lesion is not excluded. Consider follow-up with elective MRCP. 5. Aortic atherosclerosis. 6. Cardiac enlargement      Disposition Plan  :    Status is: Inpatient  DVT Prophylaxis  :    Place TED hose Start: 03/11/23 0827 apixaban (ELIQUIS) tablet 5 mg     Lab Results  Component Value Date   PLT 254 03/14/2023    Diet :  Diet Order             DIET DYS 3 Room service appropriate? No; Fluid consistency: Thin  Diet effective now                    Inpatient Medications  Scheduled Meds:  apixaban  5 mg Oral BID   azithromycin  500  mg Oral Daily    insulin aspart  0-5 Units Subcutaneous QHS   insulin aspart  0-9 Units Subcutaneous TID WC   methimazole  5 mg Oral Daily   methylPREDNISolone (SOLU-MEDROL) injection  20 mg Intravenous Q24H   metoprolol tartrate  100 mg Oral BID   sodium zirconium cyclosilicate  10 g Oral BID   Continuous Infusions:  lactated ringers 100 mL/hr at 03/14/23 0616    PRN Meds:.acetaminophen, ALPRAZolam, diltiazem, oxyCODONE-acetaminophen **AND** [DISCONTINUED] oxyCODONE    Objective:   Vitals:   03/14/23 0430 03/14/23 0611 03/14/23 0847 03/14/23 0855  BP: 129/68 138/88  (!) 133/93  Pulse: 82 89  99  Resp: 19 (!) 22  (!) 23  Temp: 97.8 F (36.6 C) 97.6 F (36.4 C) 97.6 F (36.4 C) 97.6 F (36.4 C)  TempSrc: Oral Oral Oral Oral  SpO2: 94% 94% 96% 95%  Weight:      Height:        Wt Readings from Last 3 Encounters:  03/12/23 58 kg  02/16/23 52.2 kg  01/07/23 53 kg    No intake or output data in the 24 hours ending 03/14/23 1026    Physical Exam  Awake Alert, No new F.N deficits, Normal affect [place].AT,PERRAL Supple Neck, No JVD,   Symmetrical Chest wall movement, Good air movement bilaterally, coarse basilar B sounds iRRR,No Gallops,Rubs or new Murmurs,  +ve B.Sounds, Abd Soft, No tenderness,   No Cyanosis, Clubbing or edema       Data Review:    Recent Labs  Lab 03/10/23 1330 03/11/23 0755 03/12/23 0325 03/13/23 0326 03/14/23 0324  WBC 11.7* 10.2 5.6 11.3* 13.0*  HGB 12.2 11.7* 10.9* 10.5* 11.1*  HCT 35.5* 35.6* 32.4* 31.2* 33.1*  PLT 188 197 169 194 254  MCV 93.7 94.4 92.8 92.3 93.0  MCH 32.2 31.0 31.2 31.1 31.2  MCHC 34.4 32.9 33.6 33.7 33.5  RDW 14.2 14.6 14.5 14.6 14.4  LYMPHSABS 0.8 1.5 0.5* 0.7 0.6*  MONOABS 0.7 1.0 0.2 0.3 0.4  EOSABS 0.0 0.1 0.0 0.0 0.0  BASOSABS 0.0 0.0 0.0 0.0 0.0    Recent Labs  Lab 03/10/23 1330 03/11/23 0755 03/12/23 0325 03/13/23 0326 03/14/23 0324  NA 125* 134* 129* 131* 131*  K 4.8 4.3 4.8 5.0 5.2*  CL 96* 97* 100 101  97*  CO2 18* 21* 19* 20* 22  ANIONGAP 11 16* 10 10 12   GLUCOSE 106* 102* 164* 126* 136*  BUN 21 17 20  26* 33*  CREATININE 1.28* 1.37* 1.34* 1.32* 1.51*  AST 26 43* 46* 38 44*  ALT 17 29 33 33 44  ALKPHOS 238* 246* 235* 223* 211*  BILITOT 0.6 0.6 0.8 0.7 0.7  ALBUMIN 3.7 2.8* 2.7* 2.7* 2.8*  CRP  --  21.9* 17.5* 9.7* 7.7*  PROCALCITON  --  1.59 1.14 0.69 0.44  LATICACIDVEN 1.3  --   --   --   --   INR 1.5*  --   --   --   --   TSH 1.089 0.912  --   --   --   HGBA1C  --   --  6.1*  --   --   BNP  --  2,232.7* 4,293.0* 3,181.3* >4,500.0*  MG  --  1.9 2.0 1.8 1.8  CALCIUM 9.9 9.5 8.7* 8.7* 9.2      Recent Labs  Lab 03/10/23 1330 03/11/23 0755 03/12/23 0325 03/13/23 0326 03/14/23 0324  CRP  --  21.9* 17.5* 9.7* 7.7*  PROCALCITON  --  1.59 1.14 0.69 0.44  LATICACIDVEN 1.3  --   --   --   --   INR 1.5*  --   --   --   --   TSH 1.089 0.912  --   --   --   HGBA1C  --   --  6.1*  --   --   BNP  --  2,232.7* 4,293.0* 3,181.3* >4,500.0*  MG  --  1.9 2.0 1.8 1.8  CALCIUM 9.9 9.5 8.7* 8.7* 9.2     No results for input(s): "TSH", "T4TOTAL", "FREET4", "T3FREE", "THYROIDAB" in the last 72 hours.   Radiology Reports DG Chest Port 1 View  Result Date: 03/13/2023 CLINICAL DATA:  Shortness of breath. EXAM: PORTABLE CHEST 1 VIEW COMPARISON:  03/12/2023 FINDINGS: Stable mild cardiac enlargement. Small pleural effusions with blunting of the costophrenic angles. Increase interstitial markings compatible with mild edema. Similar appearance bibasilar pulmonary opacities. IMPRESSION: 1. Persistent bibasilar pulmonary opacities. 2. Small pleural effusions with increase interstitial markings. Electronically Signed   By: Signa Kell M.D.   On: 03/13/2023 07:48   DG Chest Port 1 View  Result Date: 03/12/2023 CLINICAL DATA:  Shortness of breath EXAM: PORTABLE CHEST 1 VIEW COMPARISON:  Chest radiograph 1 day prior and CT chest 2 days prior FINDINGS: The cardiomediastinal silhouette is stable.  Patchy opacities in both lower lobes, right worse than left, are worsened in the interim. There is no pleural effusion or pneumothorax There is no acute osseous abnormality. IMPRESSION: Worsening opacities in both lower lobes, right worse than left, likely reflecting infection. Electronically Signed   By: Lesia Hausen M.D.   On: 03/12/2023 08:42   DG Chest Port 1 View  Result Date: 03/11/2023 CLINICAL DATA:  Fever and shortness of breath. EXAM: PORTABLE CHEST 1 VIEW COMPARISON:  03/10/2023 FINDINGS: There is mild cardiac enlargement. Diffusely increased interstitial markings are noted concerning for interstitial edema. Persistent bilateral lower lobe peribronchovascular airspace opacities. No signs of pleural effusion. Visualized osseous structures are unremarkable. IMPRESSION: 1. No change from previous exam. 2. Cardiomegaly and interstitial edema. 3. Persistent bilateral lower lobe peribronchovascular airspace opacities compatible with multifocal pneumonia. Electronically Signed   By: Signa Kell M.D.   On: 03/11/2023 06:41   US Abdomen Limited RUQ (LIVER/GB)  Result Date: 03/10/2023 CLINICAL DATA:  Acute pancreatitis. EXAM: ULTRASOUND ABDOMEN LIMITED RIGHT UPPER QUADRANT COMPARISON:  CT 03/10/2023 FINDINGS: Gallbladder: No gallstones or sludge. Gallbladder wall is mildly thickened at 3.8 mm. Murphy's sign is negative. Common bile duct: Diameter: 7 mm, normal Liver: No focal lesion identified. Within normal limits in parenchymal echogenicity. Portal vein is patent on color Doppler imaging with normal direction of blood flow towards the liver. Other: Dilated pancreatic duct at 5 mm. IMPRESSION: 1. Mild gallbladder wall thickening. No evidence of cholelithiasis or acute cholecystitis. 2. Pancreatic ductal dilatation, nonspecific. Electronically Signed   By: Burman Nieves M.D.   On: 03/10/2023 22:47   CT CHEST ABDOMEN PELVIS W CONTRAST  Result Date: 03/10/2023 CLINICAL DATA:  Sepsis. EXAM: CT CHEST,  ABDOMEN, AND PELVIS WITH CONTRAST TECHNIQUE: Multidetector CT imaging of the chest, abdomen and pelvis was performed following the standard protocol during bolus administration of intravenous contrast. RADIATION DOSE REDUCTION: This exam was performed according to the departmental dose-optimization program which includes automated exposure control, adjustment of the mA and/or kV according to patient size and/or use of iterative reconstruction technique. CONTRAST:  85mL OMNIPAQUE IOHEXOL 350 MG/ML SOLN COMPARISON:  Chest radiograph 03/10/2023.  CT abdomen 10/14/2019 FINDINGS:  CT CHEST FINDINGS Cardiovascular: Mild cardiac enlargement. No pericardial effusions. Normal caliber thoracic aorta. No dissection. Calcification of the aorta and coronary arteries. 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 Esophagus is decompressed. Scattered lymph nodes are not pathologically enlarged. Lungs/Pleura: Patchy and confluent airspace infiltrates throughout both lungs. Changes likely to represent multifocal pneumonia. No pleural effusions. No pneumothorax. Airways are patent. Musculoskeletal: No chest wall mass or suspicious bone lesions identified. CT ABDOMEN PELVIS FINDINGS Hepatobiliary: Mild diffuse fatty infiltration of the liver. No focal lesions. Gallbladder wall is thickened and edematous. No stones identified. Changes could indicate cholecystitis or may be related to liver disease. Consider correlation with ultrasound. Pancreas: Diffuse pancreatic ductal dilatation. Mild parenchymal atrophy. No obstructing stone or lesion identified. No peripancreatic edema or collections. Changes could be due to early acute or chronic pancreatitis or to an occult obstructing lesion or stone. Consider MRCP for further evaluation. Spleen: Normal in size without focal abnormality. Adrenals/Urinary Tract: No adrenal gland nodules. Kidneys are symmetrical. No hydronephrosis or  hydroureter. Bladder is normal. Stomach/Bowel: Stomach, small bowel, and colon are not abnormally distended. No wall thickening or inflammatory changes. Stool throughout the colon. Colonic diverticulosis without evidence of acute diverticulitis. Appendix is normal. Vascular/Lymphatic: Aortic atherosclerosis. No enlarged abdominal or pelvic lymph nodes. Reproductive: Uterus and bilateral adnexa are unremarkable. Other: No abdominal wall hernia or abnormality. No abdominopelvic ascites. Musculoskeletal: Postoperative right hip hemiarthroplasty. Degenerative changes in the spine. IMPRESSION: 1. Patchy infiltrates throughout the lungs likely representing multifocal pneumonia. 2. Incidental heterogeneous and enlarged thyroid. Recommend non-emergent thyroid ultrasound. Reference: J Am Coll Radiol. 2015 Feb;12(2): 143-50 3. Nonspecific gallbladder wall thickening and edema. No stones. Consider ultrasound for further evaluation. 4. Diffuse pancreatic ductal dilatation with evidence of pancreatic atrophy. This could represent early acute or chronic pancreatitis but an occult obstructing lesion is not excluded. Consider follow-up with elective MRCP. 5. Aortic atherosclerosis. 6. Cardiac enlargement. Electronically Signed   By: Burman Nieves M.D.   On: 03/10/2023 18:23   DG Chest Port 1 View  Result Date: 03/10/2023 CLINICAL DATA:  Fever for 2 days.  Possible sepsis. EXAM: PORTABLE CHEST 1 VIEW COMPARISON:  01/18/2022 FINDINGS: Midline trachea. Mild cardiomegaly. Atherosclerosis in the transverse aorta. No pleural effusion or pneumothorax. Moderate pulmonary interstitial thickening is felt to be slightly increased. No lobar consolidation. IMPRESSION: Moderate pulmonary interstitial thickening, felt to be slightly increased. This could be related to smoking/chronic bronchitis (ex-smoker quitting 3 years ago). However, viral or atypical/mycoplasma pneumonia could look similar. Aortic Atherosclerosis (ICD10-I70.0).  Electronically Signed   By: Jeronimo Greaves M.D.   On: 03/10/2023 15:09      Signature  -   Susa Raring M.D on 03/14/2023 at 10:26 AM   -  To page go to [email or web address]

## 2023-03-15 DIAGNOSIS — A419 Sepsis, unspecified organism: Secondary | ICD-10-CM | POA: Diagnosis not present

## 2023-03-15 LAB — COMPREHENSIVE METABOLIC PANEL
ALT: 58 U/L — ABNORMAL HIGH (ref 0–44)
AST: 53 U/L — ABNORMAL HIGH (ref 15–41)
Albumin: 2.8 g/dL — ABNORMAL LOW (ref 3.5–5.0)
Alkaline Phosphatase: 196 U/L — ABNORMAL HIGH (ref 38–126)
Anion gap: 10 (ref 5–15)
BUN: 35 mg/dL — ABNORMAL HIGH (ref 8–23)
CO2: 22 mmol/L (ref 22–32)
Calcium: 8.9 mg/dL (ref 8.9–10.3)
Chloride: 95 mmol/L — ABNORMAL LOW (ref 98–111)
Creatinine, Ser: 1.47 mg/dL — ABNORMAL HIGH (ref 0.44–1.00)
GFR, Estimated: 37 mL/min — ABNORMAL LOW (ref >60)
Glucose, Bld: 126 mg/dL — ABNORMAL HIGH (ref 70–99)
Potassium: 4.5 mmol/L (ref 3.5–5.1)
Sodium: 127 mmol/L — ABNORMAL LOW (ref 135–145)
Total Bilirubin: 0.6 mg/dL (ref 0.3–1.2)
Total Protein: 5.8 g/dL — ABNORMAL LOW (ref 6.5–8.1)

## 2023-03-15 LAB — CBC WITH DIFFERENTIAL/PLATELET
Abs Immature Granulocytes: 0.22 10*3/uL — ABNORMAL HIGH (ref 0.00–0.07)
Basophils Absolute: 0 10*3/uL (ref 0.0–0.1)
Basophils Relative: 0 %
Eosinophils Absolute: 0 10*3/uL (ref 0.0–0.5)
Eosinophils Relative: 0 %
HCT: 32.2 % — ABNORMAL LOW (ref 36.0–46.0)
Hemoglobin: 10.8 g/dL — ABNORMAL LOW (ref 12.0–15.0)
Immature Granulocytes: 2 %
Lymphocytes Relative: 4 %
Lymphs Abs: 0.5 10*3/uL — ABNORMAL LOW (ref 0.7–4.0)
MCH: 31.8 pg (ref 26.0–34.0)
MCHC: 33.5 g/dL (ref 30.0–36.0)
MCV: 94.7 fL (ref 80.0–100.0)
Monocytes Absolute: 0.4 10*3/uL (ref 0.1–1.0)
Monocytes Relative: 3 %
Neutro Abs: 10.2 10*3/uL — ABNORMAL HIGH (ref 1.7–7.7)
Neutrophils Relative %: 91 %
Platelets: 273 10*3/uL (ref 150–400)
RBC: 3.4 MIL/uL — ABNORMAL LOW (ref 3.87–5.11)
RDW: 14.4 % (ref 11.5–15.5)
WBC: 11.3 10*3/uL — ABNORMAL HIGH (ref 4.0–10.5)
nRBC: 0.2 % (ref 0.0–0.2)

## 2023-03-15 LAB — CULTURE, BLOOD (ROUTINE X 2)
Culture: NO GROWTH
Culture: NO GROWTH
Special Requests: ADEQUATE

## 2023-03-15 LAB — GLUCOSE, CAPILLARY: Glucose-Capillary: 161 mg/dL — ABNORMAL HIGH (ref 70–99)

## 2023-03-15 LAB — MAGNESIUM: Magnesium: 1.8 mg/dL (ref 1.7–2.4)

## 2023-03-15 LAB — PROCALCITONIN: Procalcitonin: 0.26 ng/mL

## 2023-03-15 LAB — C-REACTIVE PROTEIN: CRP: 3.9 mg/dL — ABNORMAL HIGH (ref <1.0)

## 2023-03-15 MED ORDER — LACTATED RINGERS IV SOLN: INTRAVENOUS | Status: DC

## 2023-03-15 NOTE — Plan of Care (Signed)

## 2023-03-15 NOTE — Discharge Summary (Signed)
Rhonda Marshall ZOX:[id number] DOB: December 27, 1950 DOA: 03/10/2023  PCP: Knox Royalty, MD  Admit date: 03/10/2023  Discharge date: 03/15/2023  Admitted From: Home   Disposition:  Home   Recommendations for Outpatient Follow-up:   Follow up with PCP in 1-2 weeks  PCP Please obtain BMP/CBC, 2 view CXR in 1week,  (see Discharge instructions)   PCP Please follow up on the following pending results: Monitor BMP in 7 to 10 days, if UTI reoccurs discontinue Jardiance, needs outpatient follow-up with endocrine to get thyroid biopsy.   Home Health: PT   Equipment/Devices: has home walker  Consultations: None   Discharge Condition: Stable     CODE STATUS: Full     Diet Recommendation: Soft diet with 1.5 L fluid restriction per day  Chief Complaint  Patient presents with   Fever     Brief history of present illness from the day of admission and additional interim summary    72 y.o. female with medical history significant of persistent atrial fibrillation on Eliquis, chronic systolic heart failure, LBBB, right carotid artery stenosis, laryngeal cancer, hyperparathyroidism, rheumatoid arthritis on immunosuppressive's, anxiety who presents with fever and weakness about 2 days, in the ER was found to be septic due to UTI and pneumonia, she was hypotensive along with an A-fib RVR.  She was admitted for further care.                                                                  Hospital Course    Sepsis present on admission caused by combination of UTI and atypical pneumonia on CT chest. she currently on empiric antibiotics which will be continued, IV fluid bolus and maintenance continued, sepsis pathophysiology gradually solved, negative cultures, symptom-free now and eager to go home.  Had stable bladder scans without any  evidence of urinary retention.  Did well with PT and now completely symptom-free back to baseline.  Has finished antibiotics course.  Hyponatremia - Mild.  Secondary to sepsis and dehydration, proved after hydration, lungs are clear, no signs of fluid overload, PCP to recheck BMP in 7 to 10 days.   Metabolic acidosis Secondary to hypotension and AKI.  Hydrate and monitor.   AKI (acute kidney injury) (HCC)   - Due to sepsis and dehydration, improved with fluids, bladder scan stable without any retention, continue to hold Entresto for another 7 to 10 days till [facility] checked by PCP.   Hyperkalemia.  Lokelma.     Hypotension - Dehydration & AKI  - Hydrate, blood pressure improving.  Continue to hold Entresto.   Hyperthyroidism  - Continue methimazole, stable TSH and free T4.  Noted nonspecific thyroid findings on CT chest, she requires outpatient follow-up with endocrine for possible ultrasound of her thyroid to be arranged by PCP.   Chronic systolic  CHF (congestive heart failure) (HCC)  - Last echocardiogram few months ago shows EF of 40%, with global hypokinesis, currently severely dehydrated, hydrate with caution and monitor, at risk for developing fluid overload, monitor closely.  Entresto held due to hypotension, beta-blocker continued as tolerated by blood pressure and monitor.  BNP does not correlate with her clinical status.   Chronic A-fib, in RVR due to sepsis - Continue Eliquis, beta-blocker, stable.   Chronic pain and anxiety.  Home pain medication and anxiety medications increased as per family request.  Counseled not to overuse, at risk for overdose and encephalopathy.  Family explained in detail.   Mild cognitive impairment.  At risk for delirium, per family previous hospitalizations she was confused, monitor.     DM. type II.  ISS.  She is on Jardiance, continue, if gets UTI again - PCP to consider stopping it.   Discharge diagnosis     Principal Problem:   Sepsis (HCC) Active  Problems:   Longstanding persistent atrial fibrillation (HCC)   Chronic systolic CHF (congestive heart failure) (HCC)   Hyperthyroidism   Hypotension   AKI (acute kidney injury) (HCC)   Metabolic acidosis   Hyponatremia    Discharge instructions    Discharge Instructions     Discharge instructions   Complete by: As directed    Follow with Primary MD Knox Royalty, MD in 10-14 days   Get CBC, CMP, 2 view Chest X ray -  checked next visit with your primary MD    Activity: As tolerated with Full fall precautions use walker/cane & assistance as needed  Disposition Home    Diet: Soft diet, 1.5 L fluid restriction per day with feeding assistance and aspiration precautions.  Diet Order           DIET DYS 3 Room service appropriate? No; Fluid consistency: Thin; Fluid restriction: 1200 mL Fluid  Diet effective now               For Heart failure patients - Check your Weight same time everyday, if you gain over 2 pounds, or you develop in leg swelling, experience more shortness of breath or chest pain, call your Primary MD immediately. Follow Cardiac Low Salt Diet and 1.5 lit/day fluid restriction.  Special Instructions: If you have smoked or chewed Tobacco  in the last 2 yrs please stop smoking, stop any regular Alcohol  and or any Recreational drug use.  On your next visit with your primary care physician please Get Medicines reviewed and adjusted.  Please request your Prim.MD to go over all Hospital Tests and Procedure/Radiological results at the follow up, please get all Hospital records sent to your Prim MD by signing hospital release before you go home.  If you experience worsening of your admission symptoms, develop shortness of breath, life threatening emergency, suicidal or homicidal thoughts you must seek medical attention immediately by calling 911 or calling your MD immediately  if symptoms less severe.  You Must read complete instructions/literature along with  all the possible adverse reactions/side effects for all the Medicines you take and that have been prescribed to you. Take any new Medicines after you have completely understood and accpet all the possible adverse reactions/side effects.   Do not drive when taking Pain medications.  Do not take more than prescribed Pain, Sleep and Anxiety Medications   Increase activity slowly   Complete by: As directed        Discharge Medications   Allergies as of 03/15/2023  Reactions   Clindamycin/lincomycin Rash   Latex Rash   Latex gloves        Medication List     STOP taking these medications    Entresto 24-26 MG Generic drug: sacubitril-valsartan       TAKE these medications    acetaminophen 325 MG tablet Commonly known as: TYLENOL Take 1-2 tablets (325-650 mg total) by mouth every 6 (six) hours as needed for mild pain (pain score 1-3 or temp > 100.5).   ALPRAZolam 1 MG tablet Commonly known as: XANAX Take 1 mg by mouth at bedtime as needed for anxiety.   Eliquis 5 MG Tabs tablet Generic drug: apixaban Take 1 tablet (5 mg total) by mouth 2 (two) times daily.   empagliflozin 10 MG Tabs tablet Commonly known as: JARDIANCE Take 1 tablet (10 mg total) by mouth daily.   hydroxychloroquine 200 MG tablet Commonly known as: PLAQUENIL Take 200 mg by mouth daily.   methimazole 5 MG tablet Commonly known as: TAPAZOLE Take 5 mg by mouth daily.   metoprolol succinate 100 MG 24 hr tablet Commonly known as: TOPROL-XL Take 1 tablet (100 mg total) by mouth 2 (two) times daily. Take with or immediately following a meal.   multivitamin with minerals Tabs tablet Take 1 tablet by mouth daily.   oxyCODONE-acetaminophen 10-325 MG tablet Commonly known as: PERCOCET Take 1 tablet by mouth every 4 (four) hours as needed for pain.   Vitamin D (Ergocalciferol) 1.25 MG (50000 UNIT) Caps capsule Commonly known as: DRISDOL Take 50,000 Units by mouth once a week.          Follow-up Information     Knox Royalty, MD. Schedule an appointment as soon as possible for a visit in 2 week(s).   Specialty: Family Medicine Contact information: [address] [place] [address] [phone]                 Major procedures and Radiology Reports - PLEASE review detailed and final reports thoroughly  -     DG Chest [facility] 1 View  Result Date: 03/13/2023 CLINICAL DATA:  Shortness of breath. EXAM: PORTABLE CHEST 1 VIEW COMPARISON:  03/12/2023 FINDINGS: Stable mild cardiac enlargement. Small pleural effusions with blunting of the costophrenic angles. Increase interstitial markings compatible with mild edema. Similar appearance bibasilar pulmonary opacities. IMPRESSION: 1. Persistent bibasilar pulmonary opacities. 2. Small pleural effusions with increase interstitial markings. Electronically Signed   By: Signa Kell M.D.   On: 03/13/2023 07:48   DG Chest Port 1 View  Result Date: 03/12/2023 CLINICAL DATA:  Shortness of breath EXAM: PORTABLE CHEST 1 VIEW COMPARISON:  Chest radiograph 1 day prior and CT chest 2 days prior FINDINGS: The cardiomediastinal silhouette is stable. Patchy opacities in both lower lobes, right worse than left, are worsened in the interim. There is no pleural effusion or pneumothorax There is no acute osseous abnormality. IMPRESSION: Worsening opacities in both lower lobes, right worse than left, likely reflecting infection. Electronically Signed   By: Lesia Hausen M.D.   On: 03/12/2023 08:42   DG Chest Port 1 View  Result Date: 03/11/2023 CLINICAL DATA:  Fever and shortness of breath. EXAM: PORTABLE CHEST 1 VIEW COMPARISON:  03/10/2023 FINDINGS: There is mild cardiac enlargement. Diffusely increased interstitial markings are noted concerning for interstitial edema. Persistent bilateral lower lobe peribronchovascular airspace opacities. No signs of pleural effusion. Visualized osseous structures are unremarkable. IMPRESSION: 1. No change from  previous exam. 2. Cardiomegaly and interstitial edema. 3. Persistent bilateral lower  lobe peribronchovascular airspace opacities compatible with multifocal pneumonia. Electronically Signed   By: Signa Kell M.D.   On: 03/11/2023 06:41   US Abdomen Limited RUQ (LIVER/GB)  Result Date: 03/10/2023 CLINICAL DATA:  Acute pancreatitis. EXAM: ULTRASOUND ABDOMEN LIMITED RIGHT UPPER QUADRANT COMPARISON:  CT 03/10/2023 FINDINGS: Gallbladder: No gallstones or sludge. Gallbladder wall is mildly thickened at 3.8 mm. Murphy's sign is negative. Common bile duct: Diameter: 7 mm, normal Liver: No focal lesion identified. Within normal limits in parenchymal echogenicity. Portal vein is patent on color Doppler imaging with normal direction of blood flow towards the liver. Other: Dilated pancreatic duct at 5 mm. IMPRESSION: 1. Mild gallbladder wall thickening. No evidence of cholelithiasis or acute cholecystitis. 2. Pancreatic ductal dilatation, nonspecific. Electronically Signed   By: Burman Nieves M.D.   On: 03/10/2023 22:47   CT CHEST ABDOMEN PELVIS W CONTRAST  Result Date: 03/10/2023 CLINICAL DATA:  Sepsis. EXAM: CT CHEST, ABDOMEN, AND PELVIS WITH CONTRAST TECHNIQUE: Multidetector CT imaging of the chest, abdomen and pelvis was performed following the standard protocol during bolus administration of intravenous contrast. RADIATION DOSE REDUCTION: This exam was performed according to the departmental dose-optimization program which includes automated exposure control, adjustment of the mA and/or kV according to patient size and/or use of iterative reconstruction technique. CONTRAST:  85mL OMNIPAQUE IOHEXOL 350 MG/ML SOLN COMPARISON:  Chest radiograph 03/10/2023.  CT abdomen 10/14/2019 FINDINGS: CT CHEST FINDINGS Cardiovascular: Mild cardiac enlargement. No pericardial effusions. Normal caliber thoracic aorta. No dissection. Calcification of the aorta and coronary arteries. 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 Esophagus is decompressed. Scattered lymph nodes are not pathologically enlarged. Lungs/Pleura: Patchy and confluent airspace infiltrates throughout both lungs. Changes likely to represent multifocal pneumonia. No pleural effusions. No pneumothorax. Airways are patent. Musculoskeletal: No chest wall mass or suspicious bone lesions identified. CT ABDOMEN PELVIS FINDINGS Hepatobiliary: Mild diffuse fatty infiltration of the liver. No focal lesions. Gallbladder wall is thickened and edematous. No stones identified. Changes could indicate cholecystitis or may be related to liver disease. Consider correlation with ultrasound. Pancreas: Diffuse pancreatic ductal dilatation. Mild parenchymal atrophy. No obstructing stone or lesion identified. No peripancreatic edema or collections. Changes could be due to early acute or chronic pancreatitis or to an occult obstructing lesion or stone. Consider MRCP for further evaluation. Spleen: Normal in size without focal abnormality. Adrenals/Urinary Tract: No adrenal gland nodules. Kidneys are symmetrical. No hydronephrosis or hydroureter. Bladder is normal. Stomach/Bowel: Stomach, small bowel, and colon are not abnormally distended. No wall thickening or inflammatory changes. Stool throughout the colon. Colonic diverticulosis without evidence of acute diverticulitis. Appendix is normal. Vascular/Lymphatic: Aortic atherosclerosis. No enlarged abdominal or pelvic lymph nodes. Reproductive: Uterus and bilateral adnexa are unremarkable. Other: No abdominal wall hernia or abnormality. No abdominopelvic ascites. Musculoskeletal: Postoperative right hip hemiarthroplasty. Degenerative changes in the spine. IMPRESSION: 1. Patchy infiltrates throughout the lungs likely representing multifocal pneumonia. 2. Incidental heterogeneous and enlarged thyroid. Recommend non-emergent thyroid ultrasound. Reference: J Am Coll Radiol. 2015  Feb;12(2): 143-50 3. Nonspecific gallbladder wall thickening and edema. No stones. Consider ultrasound for further evaluation. 4. Diffuse pancreatic ductal dilatation with evidence of pancreatic atrophy. This could represent early acute or chronic pancreatitis but an occult obstructing lesion is not excluded. Consider follow-up with elective MRCP. 5. Aortic atherosclerosis. 6. Cardiac enlargement. Electronically Signed   By: Burman Nieves M.D.   On: 03/10/2023 18:23   DG Chest Port 1 View  Result Date: 03/10/2023 CLINICAL DATA:  Fever for 2  days.  Possible sepsis. EXAM: PORTABLE CHEST 1 VIEW COMPARISON:  01/18/2022 FINDINGS: Midline trachea. Mild cardiomegaly. Atherosclerosis in the transverse aorta. No pleural effusion or pneumothorax. Moderate pulmonary interstitial thickening is felt to be slightly increased. No lobar consolidation. IMPRESSION: Moderate pulmonary interstitial thickening, felt to be slightly increased. This could be related to smoking/chronic bronchitis (ex-smoker quitting 3 years ago). However, viral or atypical/mycoplasma pneumonia could look similar. Aortic Atherosclerosis (ICD10-I70.0). Electronically Signed   By: Jeronimo Greaves M.D.   On: 03/10/2023 15:09    Micro Results    Recent Results (from the past 240 hour(s))  Blood Culture (routine x 2)     Status: None   Collection Time: 03/10/23  1:30 PM   Specimen: BLOOD LEFT WRIST  Result Value Ref Range Status   Specimen Description   Final    BLOOD LEFT WRIST Performed at [facility], [address], [place], [place] [address]    Special Requests   Final    Blood Culture adequate volume BOTTLES DRAWN AEROBIC AND ANAEROBIC Performed at [facility], [address], [place], [place] [address]    Culture   Final    NO GROWTH 5 DAYS Performed at [facility], [address] [place], [place] [address]    Report Status 03/15/2023 FINAL  Final  Resp panel by RT-PCR (RSV, Flu A&B, Covid)  Anterior Nasal Swab     Status: None   Collection Time: 03/10/23  1:39 PM   Specimen: Anterior Nasal Swab  Result Value Ref Range Status   SARS Coronavirus 2 by RT PCR NEGATIVE NEGATIVE Final    Comment: (NOTE) SARS-CoV-2 target nucleic acids are NOT DETECTED.  The SARS-CoV-2 RNA is generally detectable in upper respiratory specimens during the acute phase of infection. The lowest concentration of SARS-CoV-2 viral copies this assay can detect is 138 copies/mL. A negative result does not preclude SARS-Cov-2 infection and should not be used as the sole basis for treatment or other patient management decisions. A negative result may occur with  improper specimen collection/handling, submission of specimen other than nasopharyngeal swab, presence of viral mutation(s) within the areas targeted by this assay, and inadequate number of viral copies(<138 copies/mL). A negative result must be combined with clinical observations, patient history, and epidemiological information. The expected result is Negative.  Fact Sheet for Patients:  BloggerCourse.com  Fact Sheet for Healthcare Providers:  SeriousBroker.it  This test is no t yet approved or cleared by the [place] FDA and  has been authorized for detection and/or diagnosis of SARS-CoV-2 by FDA under an Emergency Use Authorization (EUA). This EUA will remain  in effect (meaning this test can be used) for the duration of the COVID-19 declaration under Section 564(b)(1) of the Act, 21 U.S.C.section 360bbb-3(b)(1), unless the authorization is terminated  or revoked sooner.       Influenza A by PCR NEGATIVE NEGATIVE Final   Influenza B by PCR NEGATIVE NEGATIVE Final    Comment: (NOTE) The Xpert Xpress SARS-CoV-2/FLU/RSV plus assay is intended as an aid in the diagnosis of influenza from Nasopharyngeal swab specimens and should not be used as a sole basis for treatment. Nasal washings  and aspirates are unacceptable for Xpert Xpress SARS-CoV-2/FLU/RSV testing.  Fact Sheet for Patients: BloggerCourse.com  Fact Sheet for Healthcare Providers: SeriousBroker.it  This test is not yet approved or cleared by the [place] FDA and has been authorized for detection and/or diagnosis of SARS-CoV-2 by FDA under an Emergency Use Authorization (  EUA). This EUA will remain in effect (meaning this test can be used) for the duration of the COVID-19 declaration under Section 564(b)(1) of the Act, 21 U.S.C. section 360bbb-3(b)(1), unless the authorization is terminated or revoked.     Resp Syncytial Virus by PCR NEGATIVE NEGATIVE Final    Comment: (NOTE) Fact Sheet for Patients: BloggerCourse.com  Fact Sheet for Healthcare Providers: SeriousBroker.it  This test is not yet approved or cleared by the [place] FDA and has been authorized for detection and/or diagnosis of SARS-CoV-2 by FDA under an Emergency Use Authorization (EUA). This EUA will remain in effect (meaning this test can be used) for the duration of the COVID-19 declaration under Section 564(b)(1) of the Act, 21 U.S.C. section 360bbb-3(b)(1), unless the authorization is terminated or revoked.  Performed at [facility], [address], [place], [place] [address]   Blood Culture (routine x 2)     Status: None   Collection Time: 03/10/23  1:39 PM   Specimen: BLOOD  Result Value Ref Range Status   Specimen Description   Final    BLOOD LEFT ANTECUBITAL Performed at [facility], [address] [place], [place] [address]    Special Requests   Final    BOTTLES DRAWN AEROBIC AND ANAEROBIC Blood Culture results may not be optimal due to an inadequate volume of blood received in culture bottles Performed at [facility], [address],  [place], [place] [address]    Culture   Final    NO GROWTH 5 DAYS Performed at [facility], [address] [place], [place] [address]    Report Status 03/15/2023 FINAL  Final  Urine Culture     Status: Abnormal   Collection Time: 03/10/23  7:34 PM   Specimen: Urine, Random  Result Value Ref Range Status   Specimen Description   Final    URINE, RANDOM Performed at [facility], [address] [place], [place] [address]    Special Requests   Final    NONE Reflexed from [id number] Performed at [facility], [address] [place], [place] [address]    Culture (A)  Final    <10,000 COLONIES/mL INSIGNIFICANT GROWTH Performed at [facility], [address], [place], [place] [address]    Report Status 03/11/2023 FINAL  Final    Today   Subjective    Rhonda Marshall today has no headache,no chest abdominal pain,no new weakness tingling or numbness, feels much better wants to go home today.     Objective   Blood pressure (!) 148/81, pulse (!) 55, temperature 97.6 F (36.4 C), temperature source Oral, resp. rate 16, height 5\' 1"  (1.549 m), weight 58 kg, SpO2 93%.   Intake/Output Summary (Last 24 hours) at 03/15/2023 0826 Last data filed at 03/14/2023 1720 Gross per 24 hour  Intake 480 ml  Output 150 ml  Net 330 ml    Exam  Awake Alert, No new F.N deficits,    [place].AT,PERRAL Supple Neck,   Symmetrical Chest wall movement, Good air movement bilaterally, CTAB RRR,No Gallops,   +ve B.Sounds, Abd Soft, Non tender,  No Cyanosis, Clubbing or edema    Data Review   Recent Labs  Lab 03/11/23 0755 03/12/23 0325 03/13/23 0326 03/14/23 0324 03/15/23 0258  WBC 10.2 5.6 11.3* 13.0* 11.3*  HGB 11.7* 10.9* 10.5* 11.1* 10.8*  HCT 35.6* 32.4* 31.2* 33.1* 32.2*  PLT 197 169 194 254 273  MCV 94.4 92.8 92.3 93.0 94.7  MCH 31.0 31.2 31.1 31.2 31.8  MCHC 32.9 33.6 33.7 33.5 33.5  RDW 14.6 14.5 14.6 14.4 14.4  LYMPHSABS 1.5 0.5* 0.7  0.6* 0.5*  MONOABS 1.0 0.2 0.3 0.4 0.4  EOSABS 0.1 0.0 0.0 0.0 0.0  BASOSABS 0.0 0.0 0.0 0.0 0.0   Recent Labs  Lab 03/10/23 1330 03/11/23 0755 03/12/23 0325 03/13/23 0326 03/14/23 0324 03/15/23 0258  NA 125* 134* 129* 131* 131* 127*  K 4.8 4.3 4.8 5.0 5.2* 4.5  CL 96* 97* 100 101 97* 95*  CO2 18* 21* 19* 20* 22 22  ANIONGAP 11 16* 10 10 12 10   GLUCOSE 106* 102* 164* 126* 136* 126*  BUN 21 17 20  26* 33* 35*  CREATININE 1.28* 1.37* 1.34* 1.32* 1.51* 1.47*  AST 26 43* 46* 38 44* 53*  ALT 17 29 33 33 44 58*  ALKPHOS 238* 246* 235* 223* 211* 196*  BILITOT 0.6 0.6 0.8 0.7 0.7 0.6  ALBUMIN 3.7 2.8* 2.7* 2.7* 2.8* 2.8*  CRP  --  21.9* 17.5* 9.7* 7.7* 3.9*  PROCALCITON  --  1.59 1.14 0.69 0.44 0.26  LATICACIDVEN 1.3  --   --   --   --   --   INR 1.5*  --   --   --   --   --   TSH 1.089 0.912  --   --   --   --   HGBA1C  --   --  6.1*  --   --   --   MG  --  1.9 2.0 1.8 1.8 1.8  CALCIUM 9.9 9.5 8.7* 8.7* 9.2 8.9     Total Time in preparing paper work, data evaluation and todays exam - 35 minutes  Signature  -    Susa Raring M.D on 03/15/2023 at 8:26 AM   -  To page go to [email or web address]

## 2023-03-15 NOTE — TOC Transition Note (Signed)
Transition of Care [facility]) - CM/SW Discharge Note   Patient Details  Name: Ahmyah Melson MRN: [id number] Date of Birth: 1950-07-20  Transition of Care [facility]) CM/SW Contact:  Lawerance Sabal, RN Phone Number: 03/15/2023, 8:37 AM   Clinical Narrative:     Spoke w patient at bedside, declined need for [facility] services, she states he has needed DME at home, family will provide ride home. No other TOC needs identified for DC.   Final next level of care: Home/Self Care Barriers to Discharge: No Barriers Identified   Patient Goals and CMS Choice      Discharge Placement                         Discharge Plan and Services Additional resources added to the After Visit Summary for                  DME Arranged: N/A           HH Agency: NA        Social Determinants of Health (SDOH) Interventions SDOH Screenings   Tobacco Use: Medium Risk (03/10/2023)     Readmission Risk Interventions     No data to display

## 2023-03-15 NOTE — Plan of Care (Signed)

## 2023-03-15 NOTE — Discharge Instructions (Signed)
  Follow with Primary MD Knox Royalty, MD in 10-14 days   Get CBC, CMP, 2 view Chest X ray -  checked next visit with your primary MD    Activity: As tolerated with Full fall precautions use walker/cane & assistance as needed  Disposition Home    Diet: Soft diet, 1.5 L fluid restriction per day with feeding assistance and aspiration precautions.  Diet Order             DIET DYS 3 Room service appropriate? No; Fluid consistency: Thin; Fluid restriction: 1200 mL Fluid  Diet effective now                    For Heart failure patients - Check your Weight same time everyday, if you gain over 2 pounds, or you develop in leg swelling, experience more shortness of breath or chest pain, call your Primary MD immediately. Follow Cardiac Low Salt Diet and 1.5 lit/day fluid restriction.  Special Instructions: If you have smoked or chewed Tobacco  in the last 2 yrs please stop smoking, stop any regular Alcohol  and or any Recreational drug use.  On your next visit with your primary care physician please Get Medicines reviewed and adjusted.  Please request your Prim.MD to go over all Hospital Tests and Procedure/Radiological results at the follow up, please get all Hospital records sent to your Prim MD by signing hospital release before you go home.  If you experience worsening of your admission symptoms, develop shortness of breath, life threatening emergency, suicidal or homicidal thoughts you must seek medical attention immediately by calling 911 or calling your MD immediately  if symptoms less severe.  You Must read complete instructions/literature along with all the possible adverse reactions/side effects for all the Medicines you take and that have been prescribed to you. Take any new Medicines after you have completely understood and accpet all the possible adverse reactions/side effects.   Do not drive when taking Pain medications.  Do not take more than prescribed Pain, Sleep and Anxiety  Medications

## 2023-03-16 ENCOUNTER — Telehealth (HOSPITAL_COMMUNITY): Payer: Self-pay | Admitting: Cardiology

## 2023-03-16 NOTE — Telephone Encounter (Signed)
Outpatient service line:  Patient with recent admission to the emergency room for sepsis secondary to atypical pneumonia and UTI.  Patient's daughter Elease Hashimoto had called in regards to medication questions.  Patient has stopped the Jardiance due to recurrent UTI and recent admission.  Sherryll Burger has also been stopped due to AKI.  Patient's daughter had concerns that her blood pressure was too high in the 130s and 140s since its persistently low.  Discussed with Dr. Antoine Poche who recommends continued discontinuation of the Entresto until AKI resolves and upon further evaluation and follow-up.

## 2023-03-17 ENCOUNTER — Ambulatory Visit: Payer: 59 | Admitting: Internal Medicine

## 2023-03-17 DIAGNOSIS — Z79899 Other long term (current) drug therapy: Secondary | ICD-10-CM

## 2023-03-17 DIAGNOSIS — G8929 Other chronic pain: Secondary | ICD-10-CM

## 2023-03-17 DIAGNOSIS — M19011 Primary osteoarthritis, right shoulder: Secondary | ICD-10-CM

## 2023-03-17 DIAGNOSIS — M05711 Rheumatoid arthritis with rheumatoid factor of right shoulder without organ or systems involvement: Secondary | ICD-10-CM

## 2023-03-17 NOTE — Telephone Encounter (Signed)
Patient is currently in the hospital with Sepsis and have not been able to contact the patient. Patient follow up appointment has been moved to 06/15/2023.

## 2023-03-18 NOTE — Telephone Encounter (Signed)
Believe patient has been discharged from the hospital. Please advise.

## 2023-03-23 ENCOUNTER — Telehealth: Payer: Self-pay | Admitting: Cardiology

## 2023-03-23 NOTE — Telephone Encounter (Signed)
Called patient. Phone just rings. Unable to leave voicemail

## 2023-03-23 NOTE — Telephone Encounter (Signed)
Pt c/o Shortness Of Breath: STAT if SOB developed within the last 24 hours or pt is noticeably SOB on the phone  1. Are you currently SOB (can you hear that pt is SOB on the phone)? No  2. How long have you been experiencing SOB? 9/1  3. Are you SOB when sitting or when up moving around? Moving around (when she walks more than 20 steps)  4. Are you currently experiencing any other symptoms? Lightheadedness

## 2023-03-24 NOTE — Telephone Encounter (Signed)
Called patient LM to call office.

## 2023-03-25 NOTE — Telephone Encounter (Signed)
Patient daughter sates SOB is with activity but none at rest. No chest pain. She states patient is always light headed.  Nothing urgent.  She has hospital F/U appt next week and will review issues at that time

## 2023-03-29 ENCOUNTER — Inpatient Hospital Stay (HOSPITAL_COMMUNITY)
Admission: EM | Admit: 2023-03-29 | Discharge: 2023-04-02 | DRG: 291 | Disposition: A | Discharge status: Home / Self Care | Payer: 59 | Attending: Internal Medicine | Admitting: Internal Medicine

## 2023-03-29 ENCOUNTER — Emergency Department (HOSPITAL_COMMUNITY): Payer: 59

## 2023-03-29 ENCOUNTER — Other Ambulatory Visit: Payer: Self-pay

## 2023-03-29 ENCOUNTER — Encounter (HOSPITAL_COMMUNITY): Payer: Self-pay | Admitting: Pulmonary Disease

## 2023-03-29 DIAGNOSIS — E872 Acidosis, unspecified: Secondary | ICD-10-CM | POA: Diagnosis present

## 2023-03-29 DIAGNOSIS — R7989 Other specified abnormal findings of blood chemistry: Secondary | ICD-10-CM | POA: Diagnosis not present

## 2023-03-29 DIAGNOSIS — Z79899 Other long term (current) drug therapy: Secondary | ICD-10-CM

## 2023-03-29 DIAGNOSIS — J810 Acute pulmonary edema: Secondary | ICD-10-CM | POA: Diagnosis present

## 2023-03-29 DIAGNOSIS — Z91040 Latex allergy status: Secondary | ICD-10-CM

## 2023-03-29 DIAGNOSIS — E861 Hypovolemia: Secondary | ICD-10-CM | POA: Diagnosis present

## 2023-03-29 DIAGNOSIS — J9811 Atelectasis: Secondary | ICD-10-CM | POA: Diagnosis present

## 2023-03-29 DIAGNOSIS — Z87440 Personal history of urinary (tract) infections: Secondary | ICD-10-CM

## 2023-03-29 DIAGNOSIS — I4891 Unspecified atrial fibrillation: Secondary | ICD-10-CM | POA: Diagnosis not present

## 2023-03-29 DIAGNOSIS — Z8701 Personal history of pneumonia (recurrent): Secondary | ICD-10-CM

## 2023-03-29 DIAGNOSIS — I130 Hypertensive heart and chronic kidney disease with heart failure and stage 1 through stage 4 chronic kidney disease, or unspecified chronic kidney disease: Principal | ICD-10-CM | POA: Diagnosis present

## 2023-03-29 DIAGNOSIS — R0902 Hypoxemia: Secondary | ICD-10-CM

## 2023-03-29 DIAGNOSIS — M069 Rheumatoid arthritis, unspecified: Secondary | ICD-10-CM | POA: Diagnosis present

## 2023-03-29 DIAGNOSIS — R1310 Dysphagia, unspecified: Secondary | ICD-10-CM | POA: Diagnosis present

## 2023-03-29 DIAGNOSIS — Z87891 Personal history of nicotine dependence: Secondary | ICD-10-CM

## 2023-03-29 DIAGNOSIS — I358 Other nonrheumatic aortic valve disorders: Secondary | ICD-10-CM | POA: Diagnosis present

## 2023-03-29 DIAGNOSIS — R54 Age-related physical debility: Secondary | ICD-10-CM | POA: Diagnosis present

## 2023-03-29 DIAGNOSIS — Z881 Allergy status to other antibiotic agents status: Secondary | ICD-10-CM

## 2023-03-29 DIAGNOSIS — I4821 Permanent atrial fibrillation: Secondary | ICD-10-CM | POA: Diagnosis present

## 2023-03-29 DIAGNOSIS — E871 Hypo-osmolality and hyponatremia: Secondary | ICD-10-CM | POA: Diagnosis present

## 2023-03-29 DIAGNOSIS — I509 Heart failure, unspecified: Secondary | ICD-10-CM

## 2023-03-29 DIAGNOSIS — I4811 Longstanding persistent atrial fibrillation: Secondary | ICD-10-CM | POA: Diagnosis present

## 2023-03-29 DIAGNOSIS — I9589 Other hypotension: Secondary | ICD-10-CM | POA: Diagnosis present

## 2023-03-29 DIAGNOSIS — R0603 Acute respiratory distress: Principal | ICD-10-CM

## 2023-03-29 DIAGNOSIS — I5023 Acute on chronic systolic (congestive) heart failure: Secondary | ICD-10-CM | POA: Diagnosis present

## 2023-03-29 DIAGNOSIS — E8809 Other disorders of plasma-protein metabolism, not elsewhere classified: Secondary | ICD-10-CM | POA: Diagnosis present

## 2023-03-29 DIAGNOSIS — F419 Anxiety disorder, unspecified: Secondary | ICD-10-CM | POA: Diagnosis present

## 2023-03-29 DIAGNOSIS — Z6821 Body mass index (BMI) 21.0-21.9, adult: Secondary | ICD-10-CM

## 2023-03-29 DIAGNOSIS — Z833 Family history of diabetes mellitus: Secondary | ICD-10-CM

## 2023-03-29 DIAGNOSIS — Z7984 Long term (current) use of oral hypoglycemic drugs: Secondary | ICD-10-CM

## 2023-03-29 DIAGNOSIS — Z1152 Encounter for screening for COVID-19: Secondary | ICD-10-CM | POA: Diagnosis not present

## 2023-03-29 DIAGNOSIS — E876 Hypokalemia: Secondary | ICD-10-CM | POA: Diagnosis present

## 2023-03-29 DIAGNOSIS — N1832 Chronic kidney disease, stage 3b: Secondary | ICD-10-CM | POA: Diagnosis present

## 2023-03-29 DIAGNOSIS — J189 Pneumonia, unspecified organism: Secondary | ICD-10-CM

## 2023-03-29 DIAGNOSIS — J9601 Acute respiratory failure with hypoxia: Secondary | ICD-10-CM | POA: Diagnosis present

## 2023-03-29 DIAGNOSIS — I959 Hypotension, unspecified: Secondary | ICD-10-CM | POA: Diagnosis present

## 2023-03-29 DIAGNOSIS — K761 Chronic passive congestion of liver: Secondary | ICD-10-CM | POA: Diagnosis present

## 2023-03-29 DIAGNOSIS — Z8249 Family history of ischemic heart disease and other diseases of the circulatory system: Secondary | ICD-10-CM

## 2023-03-29 DIAGNOSIS — Z7901 Long term (current) use of anticoagulants: Secondary | ICD-10-CM | POA: Diagnosis not present

## 2023-03-29 DIAGNOSIS — E0590 Thyrotoxicosis, unspecified without thyrotoxic crisis or storm: Secondary | ICD-10-CM | POA: Diagnosis present

## 2023-03-29 DIAGNOSIS — Z96641 Presence of right artificial hip joint: Secondary | ICD-10-CM | POA: Diagnosis present

## 2023-03-29 DIAGNOSIS — N1831 Chronic kidney disease, stage 3a: Secondary | ICD-10-CM | POA: Diagnosis present

## 2023-03-29 DIAGNOSIS — Z8521 Personal history of malignant neoplasm of larynx: Secondary | ICD-10-CM

## 2023-03-29 DIAGNOSIS — R1311 Dysphagia, oral phase: Secondary | ICD-10-CM | POA: Diagnosis not present

## 2023-03-29 DIAGNOSIS — I482 Chronic atrial fibrillation, unspecified: Secondary | ICD-10-CM | POA: Diagnosis not present

## 2023-03-29 DIAGNOSIS — M7989 Other specified soft tissue disorders: Secondary | ICD-10-CM | POA: Diagnosis present

## 2023-03-29 LAB — COMPREHENSIVE METABOLIC PANEL
ALT: 134 U/L — ABNORMAL HIGH (ref 0–44)
AST: 76 U/L — ABNORMAL HIGH (ref 15–41)
Albumin: 4.3 g/dL (ref 3.5–5.0)
Alkaline Phosphatase: 177 U/L — ABNORMAL HIGH (ref 38–126)
Anion gap: 19 — ABNORMAL HIGH (ref 5–15)
BUN: 16 mg/dL (ref 8–23)
CO2: 18 mmol/L — ABNORMAL LOW (ref 22–32)
Calcium: 9.6 mg/dL (ref 8.9–10.3)
Chloride: 99 mmol/L (ref 98–111)
Creatinine, Ser: 1.35 mg/dL — ABNORMAL HIGH (ref 0.44–1.00)
GFR, Estimated: 41 mL/min — ABNORMAL LOW (ref >60)
Glucose, Bld: 172 mg/dL — ABNORMAL HIGH (ref 70–99)
Potassium: 4.1 mmol/L (ref 3.5–5.1)
Sodium: 136 mmol/L (ref 135–145)
Total Bilirubin: 1.9 mg/dL — ABNORMAL HIGH (ref 0.3–1.2)
Total Protein: 8.3 g/dL — ABNORMAL HIGH (ref 6.5–8.1)

## 2023-03-29 LAB — BLOOD GAS, VENOUS
Acid-base deficit: 8 mmol/L — ABNORMAL HIGH (ref 0.0–2.0)
Bicarbonate: 19.3 mmol/L — ABNORMAL LOW (ref 20.0–28.0)
O2 Saturation: 22.9 %
Patient temperature: 37
pCO2, Ven: 45 mmHg (ref 44–60)
pH, Ven: 7.24 — ABNORMAL LOW (ref 7.25–7.43)
pO2, Ven: <31 mmHg — CL (ref 32–45)

## 2023-03-29 LAB — CBC
HCT: 49.4 % — ABNORMAL HIGH (ref 36.0–46.0)
Hemoglobin: 15.5 g/dL — ABNORMAL HIGH (ref 12.0–15.0)
MCH: 32 pg (ref 26.0–34.0)
MCHC: 31.4 g/dL (ref 30.0–36.0)
MCV: 101.9 fL — ABNORMAL HIGH (ref 80.0–100.0)
Platelets: 205 10*3/uL (ref 150–400)
RBC: 4.85 MIL/uL (ref 3.87–5.11)
RDW: 16.9 % — ABNORMAL HIGH (ref 11.5–15.5)
WBC: 7.9 10*3/uL (ref 4.0–10.5)
nRBC: 0 % (ref 0.0–0.2)

## 2023-03-29 LAB — URINALYSIS, ROUTINE W REFLEX MICROSCOPIC
Bilirubin Urine: NEGATIVE
Glucose, UA: NEGATIVE mg/dL
Ketones, ur: NEGATIVE mg/dL
Leukocytes,Ua: NEGATIVE
Nitrite: NEGATIVE
Protein, ur: NEGATIVE mg/dL
Specific Gravity, Urine: 1.004 — ABNORMAL LOW (ref 1.005–1.030)
pH: 6 (ref 5.0–8.0)

## 2023-03-29 LAB — MAGNESIUM: Magnesium: 2.3 mg/dL (ref 1.7–2.4)

## 2023-03-29 LAB — GLUCOSE, CAPILLARY
Glucose-Capillary: 113 mg/dL — ABNORMAL HIGH (ref 70–99)
Glucose-Capillary: 94 mg/dL (ref 70–99)
Glucose-Capillary: 93 mg/dL (ref 70–99)

## 2023-03-29 LAB — RESP PANEL BY RT-PCR (RSV, FLU A&B, COVID)  RVPGX2
Influenza A by PCR: NEGATIVE
Influenza B by PCR: NEGATIVE
Resp Syncytial Virus by PCR: NEGATIVE
SARS Coronavirus 2 by RT PCR: NEGATIVE

## 2023-03-29 LAB — CBG MONITORING, ED
Glucose-Capillary: 165 mg/dL — ABNORMAL HIGH (ref 70–99)
Glucose-Capillary: 143 mg/dL — ABNORMAL HIGH (ref 70–99)

## 2023-03-29 LAB — TROPONIN I (HIGH SENSITIVITY)
Troponin I (High Sensitivity): 22 ng/L — ABNORMAL HIGH (ref <18)
Troponin I (High Sensitivity): 20 ng/L — ABNORMAL HIGH (ref <18)

## 2023-03-29 LAB — MRSA NEXT GEN BY PCR, NASAL: MRSA by PCR Next Gen: NOT DETECTED

## 2023-03-29 LAB — TSH: TSH: 2.047 u[IU]/mL (ref 0.350–4.500)

## 2023-03-29 LAB — BRAIN NATRIURETIC PEPTIDE: B Natriuretic Peptide: >4500 pg/mL — ABNORMAL HIGH (ref 0.0–100.0)

## 2023-03-29 LAB — I-STAT CG4 LACTIC ACID, ED
Lactic Acid, Venous: 9 mmol/L (ref 0.5–1.9)
Lactic Acid, Venous: 3.7 mmol/L (ref 0.5–1.9)

## 2023-03-29 LAB — T4, FREE: Free T4: 1 ng/dL (ref 0.61–1.12)

## 2023-03-29 MED ORDER — ISOSORBIDE DINITRATE 10 MG PO TABS
10.0000 mg | ORAL_TABLET | Freq: Three times a day (TID) | ORAL | Status: DC
  Administered 2023-03-29 – 2023-03-31 (×4): 10 mg via ORAL
  Filled 2023-03-29 (×8): qty 1

## 2023-03-29 MED ORDER — FUROSEMIDE 10 MG/ML IJ SOLN
40.0000 mg | INTRAMUSCULAR | Status: AC
  Administered 2023-03-29: 40 mg via INTRAVENOUS
  Filled 2023-03-29: qty 4

## 2023-03-29 MED ORDER — CHLORHEXIDINE GLUCONATE CLOTH 2 % EX PADS
6.0000 | MEDICATED_PAD | Freq: Every day | CUTANEOUS | Status: DC
  Administered 2023-03-30 – 2023-04-01 (×3): 6 via TOPICAL

## 2023-03-29 MED ORDER — POLYETHYLENE GLYCOL 3350 17 G PO PACK: 17.0000 g | PACK | Freq: Every day | ORAL | Status: DC | PRN

## 2023-03-29 MED ORDER — ONDANSETRON HCL 4 MG/2ML IJ SOLN: 4.0000 mg | Freq: Four times a day (QID) | INTRAMUSCULAR | Status: DC | PRN

## 2023-03-29 MED ORDER — ACETAMINOPHEN 325 MG PO TABS
650.0000 mg | ORAL_TABLET | Freq: Three times a day (TID) | ORAL | Status: DC | PRN
  Administered 2023-03-30 (×2): 650 mg via ORAL
  Filled 2023-03-29 (×3): qty 2

## 2023-03-29 MED ORDER — METOPROLOL TARTRATE 5 MG/5ML IV SOLN
2.5000 mg | INTRAVENOUS | Status: AC
  Administered 2023-03-29: 2.5 mg via INTRAVENOUS
  Filled 2023-03-29: qty 5

## 2023-03-29 MED ORDER — ORAL CARE MOUTH RINSE: 15.0000 mL | OROMUCOSAL | Status: DC | PRN

## 2023-03-29 MED ORDER — APIXABAN 5 MG PO TABS
5.0000 mg | ORAL_TABLET | Freq: Two times a day (BID) | ORAL | Status: DC
  Administered 2023-03-29 – 2023-04-02 (×9): 5 mg via ORAL
  Filled 2023-03-29 (×9): qty 1

## 2023-03-29 MED ORDER — INSULIN ASPART 100 UNIT/ML IJ SOLN
0.0000 [IU] | INTRAMUSCULAR | Status: DC
  Administered 2023-03-29: 2 [IU] via SUBCUTANEOUS
  Administered 2023-03-30: 3 [IU] via SUBCUTANEOUS
  Administered 2023-03-31: 2 [IU] via SUBCUTANEOUS
  Administered 2023-03-31 (×2): 3 [IU] via SUBCUTANEOUS
  Administered 2023-03-31: 2 [IU] via SUBCUTANEOUS
  Administered 2023-03-31 (×2): 3 [IU] via SUBCUTANEOUS
  Administered 2023-04-01 (×2): 2 [IU] via SUBCUTANEOUS
  Filled 2023-03-29: qty 0.15

## 2023-03-29 MED ORDER — ORAL CARE MOUTH RINSE
15.0000 mL | OROMUCOSAL | Status: DC
  Administered 2023-03-29 – 2023-03-31 (×9): 15 mL via OROMUCOSAL

## 2023-03-29 MED ORDER — SODIUM CHLORIDE 0.9 % IV SOLN
1.0000 g | Freq: Once | INTRAVENOUS | Status: DC
  Filled 2023-03-29: qty 10

## 2023-03-29 MED ORDER — HYDRALAZINE HCL 25 MG PO TABS
25.0000 mg | ORAL_TABLET | Freq: Three times a day (TID) | ORAL | Status: DC
  Administered 2023-03-29 – 2023-03-31 (×4): 25 mg via ORAL
  Filled 2023-03-29 (×4): qty 1

## 2023-03-29 MED ORDER — DOCUSATE SODIUM 100 MG PO CAPS: 100.0000 mg | ORAL_CAPSULE | Freq: Two times a day (BID) | ORAL | Status: DC | PRN

## 2023-03-29 MED ORDER — METOPROLOL TARTRATE 50 MG PO TABS
50.0000 mg | ORAL_TABLET | Freq: Two times a day (BID) | ORAL | Status: DC
  Administered 2023-03-29 – 2023-04-01 (×8): 50 mg via ORAL
  Filled 2023-03-29 (×7): qty 2
  Filled 2023-03-29: qty 1

## 2023-03-29 MED ORDER — VANCOMYCIN HCL IN DEXTROSE 1-5 GM/200ML-% IV SOLN
1000.0000 mg | Freq: Once | INTRAVENOUS | Status: AC
  Administered 2023-03-29: 1000 mg via INTRAVENOUS
  Filled 2023-03-29: qty 200

## 2023-03-29 MED ORDER — HYDROXYCHLOROQUINE SULFATE 200 MG PO TABS
200.0000 mg | ORAL_TABLET | Freq: Every day | ORAL | Status: DC
  Administered 2023-03-30 – 2023-04-02 (×3): 200 mg via ORAL
  Filled 2023-03-29 (×5): qty 1

## 2023-03-29 MED ORDER — SODIUM CHLORIDE 0.9 % IV SOLN
2.0000 g | Freq: Once | INTRAVENOUS | Status: AC
  Administered 2023-03-29: 2 g via INTRAVENOUS
  Filled 2023-03-29: qty 12.5

## 2023-03-29 NOTE — ED Provider Notes (Signed)
**Note De-Identified Rhonda Obfuscation** [place] EMERGENCY DEPARTMENT AT [facility] Provider Note   CSN: [id number] Arrival date & time: 03/29/23  1017     History  Chief Complaint  Patient presents with   Loss of Consciousness   Shortness of Breath    Rhonda Marshall is a 72 y.o. female.  72 year old female with history of atrial fibrillation on Eliquis and metoprolol, CHF with an EF of 40%, laryngeal cancer, rheumatoid arthritis on Plaquenil, and hyperthyroidism on methimazole who presents emergency department with shortness of breath.  Patient was discharged from the hospital on 03/15/2023 when she was diagnosed and treated for sepsis due to a UTI and pneumonia.  Since going home has had a progressive decline.  She was unable to get up on her own and had a event where she passed out so family brought her into the emergency department.  Having difficulty speaking due for the past 3 days to the shortness of breath.  Family reports that since discharge her weight has dropped from 141-124.  Dry weight is 118.  No fevers.  No new cough.  No chest pain.  Still has had some mild lower extremity swelling       Home Medications Prior to Admission medications   Medication Sig Start Date End Date Taking? Authorizing Provider  acetaminophen (TYLENOL) 500 MG tablet Take 500 mg by mouth as needed for moderate pain.   Yes [provider]  ALPRAZolam Prudy Feeler) 1 MG tablet Take 1 mg by mouth 4 (four) times daily as needed for anxiety.   Yes [provider]  ELIQUIS 5 MG TABS tablet Take 1 tablet (5 mg total) by mouth 2 (two) times daily. 12/22/22  Yes Rollene Rotunda, MD  methimazole (TAPAZOLE) 5 MG tablet Take 5 mg by mouth daily. 03/04/23  Yes [provider]  metoprolol succinate (TOPROL-XL) 100 MG 24 hr tablet Take 1 tablet (100 mg total) by mouth 2 (two) times daily. Take with or immediately following a meal. 12/10/22  Yes Hochrein, Fayrene Fearing, MD  Multiple Vitamin (MULTIVITAMIN WITH MINERALS) TABS  tablet Take 1 tablet by mouth daily.    Yes [provider]  oxyCODONE-acetaminophen (PERCOCET) 10-325 MG tablet Take 1 tablet by mouth every 4 (four) hours as needed for pain. 01/24/22  Yes Janine Ores K, PA-C  Vitamin D, Ergocalciferol, (DRISDOL) 1.25 MG (50000 UNIT) CAPS capsule Take 50,000 Units by mouth once a week. 12/28/21  Yes [provider]  amiodarone (PACERONE) 200 MG tablet Take 200 mg by mouth daily. Patient not taking: Reported on 03/29/2023    [provider]  empagliflozin (JARDIANCE) 10 MG TABS tablet Take 1 tablet (10 mg total) by mouth daily. Patient not taking: Reported on 03/29/2023 12/17/22   Rollene Rotunda, MD  hydroxychloroquine (PLAQUENIL) 200 MG tablet Take 1 tablet (200 mg total) by mouth daily. Patient not taking: Reported on 03/29/2023 03/19/23   Fuller Plan, MD  sacubitril-valsartan (ENTRESTO) 24-26 MG Take 1 tablet by mouth 2 (two) times daily. Patient not taking: Reported on 03/29/2023    [provider]      Allergies    Plaquenil [hydroxychloroquine], Clindamycin/lincomycin, and Latex    Review of Systems   Review of Systems  Physical Exam Updated Vital Signs BP (!) 152/123   Pulse (!) 127   Temp (!) 97.5 F (36.4 C) (Core (Comment))   Resp 19   Ht 5\' 1"  (1.549 m)   Wt 58 kg   SpO2 98%   BMI 24.16 kg/m  Physical  Exam Vitals and nursing note reviewed.  Constitutional:      General: She is in acute distress.     Appearance: She is well-developed. She is ill-appearing.     Comments: In respiratory distress.  Only able to speak in 1-2 word phrases.  HENT:     Head: Normocephalic and atraumatic.     Right Ear: External ear normal.     Left Ear: External ear normal.     Nose: Nose normal.  Eyes:     Extraocular Movements: Extraocular movements intact.     Conjunctiva/sclera: Conjunctivae normal.     Pupils: Pupils are equal, round, and reactive to light.  Cardiovascular:     Rate and Rhythm: Tachycardia  present. Rhythm irregular.     Heart sounds: No murmur heard. Pulmonary:     Effort: Respiratory distress present.     Breath sounds: Rales (Bibasilar) present.  Musculoskeletal:     Cervical back: Normal range of motion and neck supple.     Right lower leg: Edema present.     Left lower leg: Edema present.     Comments: Lower extremity edema 1+  Skin:    General: Skin is warm and dry.  Neurological:     Mental Status: She is alert and oriented to person, place, and time. Mental status is at baseline.     Cranial Nerves: No cranial nerve deficit.     Sensory: No sensory deficit.     Motor: No weakness.  Psychiatric:        Mood and Affect: Mood normal.     ED Results / Procedures / Treatments   Labs (all labs ordered are listed, but only abnormal results are displayed) Labs Reviewed  COMPREHENSIVE METABOLIC PANEL - Abnormal; Notable for the following components:      Result Value   CO2 18 (*)    Glucose, Bld 172 (*)    Creatinine, Ser 1.35 (*)    Total Protein 8.3 (*)    AST 76 (*)    ALT 134 (*)    Alkaline Phosphatase 177 (*)    Total Bilirubin 1.9 (*)    GFR, Estimated 41 (*)    Anion gap 19 (*)    All other components within normal limits  CBC - Abnormal; Notable for the following components:   Hemoglobin 15.5 (*)    HCT 49.4 (*)    MCV 101.9 (*)    RDW 16.9 (*)    All other components within normal limits  BRAIN NATRIURETIC PEPTIDE - Abnormal; Notable for the following components:   B Natriuretic Peptide >4,500.0 (*)    All other components within normal limits  BLOOD GAS, VENOUS - Abnormal; Notable for the following components:   pH, Ven 7.24 (*)    pO2, Ven <31 (*)    Bicarbonate 19.3 (*)    Acid-base deficit 8.0 (*)    All other components within normal limits  URINALYSIS, ROUTINE W REFLEX MICROSCOPIC - Abnormal; Notable for the following components:   Color, Urine STRAW (*)    Specific Gravity, Urine 1.004 (*)    Hgb urine dipstick MODERATE (*)     Bacteria, UA RARE (*)    All other components within normal limits  GLUCOSE, CAPILLARY - Abnormal; Notable for the following components:   Glucose-Capillary 113 (*)    All other components within normal limits  CBG MONITORING, ED - Abnormal; Notable for the following components:   Glucose-Capillary 165 (*)    All other components  within normal limits  I-STAT CG4 LACTIC ACID, ED - Abnormal; Notable for the following components:   Lactic Acid, Venous 9.0 (*)    All other components within normal limits  I-STAT CG4 LACTIC ACID, ED - Abnormal; Notable for the following components:   Lactic Acid, Venous 3.7 (*)    All other components within normal limits  CBG MONITORING, ED - Abnormal; Notable for the following components:   Glucose-Capillary 143 (*)    All other components within normal limits  TROPONIN I (HIGH SENSITIVITY) - Abnormal; Notable for the following components:   Troponin I (High Sensitivity) 22 (*)    All other components within normal limits  TROPONIN I (HIGH SENSITIVITY) - Abnormal; Notable for the following components:   Troponin I (High Sensitivity) 20 (*)    All other components within normal limits  RESP PANEL BY RT-PCR (RSV, FLU A&B, COVID)  RVPGX2  CULTURE, BLOOD (ROUTINE X 2)  CULTURE, BLOOD (ROUTINE X 2)  MAGNESIUM  TSH  T4, FREE  CBC  BASIC METABOLIC PANEL  MAGNESIUM  PHOSPHORUS    EKG None  Radiology DG Chest Port 1 View  Result Date: 03/29/2023 CLINICAL DATA:  Shortness of breath.  Syncope. EXAM: PORTABLE CHEST 1 VIEW COMPARISON:  03/13/2023 FINDINGS: The cardio pericardial silhouette is enlarged. Bibasilar atelectasis/infiltrate with small bilateral pleural effusions, progressive in the interval. Underlying chronic interstitial lung disease evident. Bones are diffusely demineralized. Telemetry leads overlie the chest. IMPRESSION: Progressive bibasilar atelectasis/infiltrate with small bilateral pleural effusions. Electronically Signed   By: Kennith Center M.D.   On: 03/29/2023 10:54    Procedures Procedures    Medications Ordered in ED Medications  hydroxychloroquine (PLAQUENIL) tablet 200 mg (200 mg Oral Not Given 03/29/23 1331)  apixaban (ELIQUIS) tablet 5 mg (5 mg Oral Given 03/29/23 1331)  metoprolol tartrate (LOPRESSOR) tablet 50 mg (50 mg Oral Given 03/29/23 1638)  isosorbide dinitrate (ISORDIL) tablet 10 mg (has no administration in time range)  hydrALAZINE (APRESOLINE) tablet 25 mg (0 mg Oral Hold 03/29/23 1331)  docusate sodium (COLACE) capsule 100 mg (has no administration in time range)  polyethylene glycol (MIRALAX / GLYCOLAX) packet 17 g (has no administration in time range)  acetaminophen (TYLENOL) tablet 650 mg (has no administration in time range)  ondansetron (ZOFRAN) injection 4 mg (has no administration in time range)  insulin aspart (novoLOG) injection 0-15 Units ( Subcutaneous Not Given 03/29/23 1643)  metoprolol tartrate (LOPRESSOR) injection 2.5 mg (2.5 mg Intravenous Given 03/29/23 1104)  furosemide (LASIX) injection 40 mg (40 mg Intravenous Given 03/29/23 1104)  ceFEPIme (MAXIPIME) 2 g in sodium chloride 0.9 % 100 mL IVPB (0 g Intravenous Stopped 03/29/23 1229)  vancomycin (VANCOCIN) IVPB 1000 mg/200 mL premix (0 mg Intravenous Stopped 03/29/23 1303)  furosemide (LASIX) injection 40 mg (40 mg Intravenous Given 03/29/23 1747)    ED Course/ Medical Decision Making/ A&P Clinical Course as of 03/29/23 1751  Sun Mar 29, 2023  1148 Dr Theophilus Bones [RP]    Clinical Course User Index [RP] Rondel Baton, MD                                 Medical Decision Making Amount and/or Complexity of Data Reviewed Labs: ordered. Radiology: ordered.  Risk Prescription drug management. Decision regarding hospitalization.   Rhonda Marshall is a 72 y.o. female with comorbidities that complicate the patient evaluation including atrial fibrillation on Eliquis and metoprolol, CHF with an  EF of 40%, laryngeal cancer,  rheumatoid arthritis on Plaquenil, and hyperthyroidism on methimazole who presents emergency department with shortness of breath.    Initial Ddx:  Heart failure, pneumonia, PE, atrial fibrillation, COPD exacerbation  MDM/Course:  Patient presents to the emergency department with shortness of breath has been worsening over the past few days.  On exam does appear to be in respiratory distress and was quickly placed on BiPAP.  She is in atrial fibrillation with RVR at this time.  Does have some mild lower extremity edema and bibasilar Rales with diminished breath sounds in the bases.  No wheezing to suggest COPD and does not have a history of this.  Upon re-evaluation was much more comfortable on the BiPAP.  Was given metoprolol for her atrial fibrillation with RVR which improved her heart rate to the low 100s from the 140s.  Chest x-ray showed bilateral pulmonary edema and suspect that her symptoms are likely due to to heart failure exacerbation.  Lactic acid was elevated as well which may represent heart failure but with her chest x-ray could also represent an underlying pneumonia so the patient was started on antibiotics for hospital acquired pneumonia due to her recent admission.  Given her BNP being undetectably high and volume overloaded state held off on additional fluids.  Did give her some Lasix and admitted her to the ICU for further management.  This patient presents to the ED for concern of complaints listed in HPI, this involves an extensive number of treatment options, and is a complaint that carries with it a high risk of complications and morbidity. Disposition including potential need for admission considered.   Dispo: ICU  Additional history obtained from son Records reviewed Outpatient Clinic Notes and DC Summary The following labs were independently interpreted: Chemistry and show  anion gap metabolic acidosis with elevated LFTs concerning for lactic acidosis along with possible  congestive hepatopathy I independently reviewed the following imaging with scope of interpretation limited to determining acute life threatening conditions related to emergency care: Chest x-ray and agree with the radiologist interpretation with the following exceptions: none I personally reviewed and interpreted cardiac monitoring: atrial fibrillation with RVR I personally reviewed and interpreted the pt's EKG: see above for interpretation  I have reviewed the patients home medications and made adjustments as needed Consults: Critical care Social Determinants of health:  Elderly  CRITICAL CARE Performed by: Rondel Baton   Total critical care time: 30 minutes  Critical care time was exclusive of separately billable procedures and treating other patients.  Critical care was necessary to treat or prevent imminent or life-threatening deterioration.  Critical care was time spent personally by me on the following activities: development of treatment plan with patient and/or surrogate as well as nursing, discussions with consultants, evaluation of patient's response to treatment, examination of patient, obtaining history from patient or surrogate, ordering and performing treatments and interventions, ordering and review of laboratory studies, ordering and review of radiographic studies, pulse oximetry and re-evaluation of patient's condition.  Final Clinical Impression(s) / ED Diagnoses Final diagnoses:  Respiratory distress  Acute on chronic systolic congestive heart failure (HCC)  Hypoxia  Atrial fibrillation with RVR (HCC)  Pneumonia of both lower lobes due to infectious organism    Rx / DC Orders ED Discharge Orders     None         Rondel Baton, MD 03/29/23 1751

## 2023-03-29 NOTE — H&P (Signed)
NAMEKimmberly Marshall, MRN:  [id number], DOB:  02/05/1951, LOS: 0 ADMISSION DATE:  03/29/2023, CONSULTATION DATE:  03/29/23  REFERRING MD:  ED, CHIEF COMPLAINT: Dyspnea on exertion  History of Present Illness:  72 year old woman history of CHF EF 30% permanent atrial fibrillation recent hospitalization for community-acquired pneumonia where all evidence paced medications for heart failure except beta-blocker were stopped presents with decompensated heart failure, acute hypoxemic respiratory failure on BiPAP.  She was hospitalized.  Stopped Entresto.  Beta-blocker was continued.  Been off this now for about 2 weeks since discharge.  No alternatives were offered.  Noted her blood pressures are much higher than baseline 90 over 60s.  130s over 110s.  Consistently her last several days.  Obtained consultation via outpatient cardiology office.  No changes were made.  Progressive dyspnea over the last week.  Acutely worse over the last 24 hours or so.  Blood pressure is elevated from baseline.  Narrow pulse pressure.  Chest x-ray with significant pulmonary edema and bilateral pleural effusions enlarged compared to 03/13/2023 on my review and interpretation.  Pertinent  Medical History  Congestive heart failure EF 30%, permanent atrial fibrillation  Significant Hospital Events: Including procedures, antibiotic start and stop dates in addition to other pertinent events   03/29/2023 presents with progressively worsening shortness of breath presumed due to CHF exacerbation  Interim History / Subjective:    Objective   Blood pressure (!) 134/114, pulse (!) 116, temperature 98.1 F (36.7 C), temperature source Rectal, resp. rate 20, height 5\' 1"  (1.549 m), weight 58 kg, SpO2 100%.    FiO2 (%):  [30 %] 30 %  No intake or output data in the 24 hours ending 03/29/23 1316 Filed Weights   03/29/23 1025  Weight: 58 kg    Examination: General: Chronically ill-appearing, frail, lying in bed HENT: Moist  mucous membranes, atraumatic normocephalic Lungs: Diminished throughout, on BiPAP Cardiovascular: Warm, no lower extremity edema, irregularly irregular Abdomen: Nondistended, nontender Neuro: Awake alert moves all extremities no focal deficits   Resolved Hospital Problem list     Assessment & Plan:  Acute decompensated congestive heart failure with reduced ejection fraction: I see no history of ischemic workup.  At least no left heart cath.  Decompensation and sign of atrial fibrillation which is chronic but also holding evidence-based medicines after recent hospitalization.  Her afterload is through the roof, exacerbated by atrial fibrillation. -- IV Lasix 40 mg now, consider increased dose if does not put out enough urine -- Resume home metoprolol which she has continued to take as an outpatient -- Blood pressures are quite elevated, afterload is high, taken off Entresto -- Isosorbide, hydralazine for afterload reduction, increase doses as tolerated and as needed  Acute hypoxemic respiratory failure: Due to gross volume overload, pulmonary edema and pleural effusions bilaterally on chest x-ray. -- BiPAP for now, assess response and consider transition to nasal cannula as tolerated -- Diuresis as discussed above -- Status post antibiotics in the ED, will hold for now as imaging most consistent with volume overload as opposed to infection  Atrial fibrillation: Borderline RVR.  Permanent A-fib.  Has failed multiple measures to get out of A-fib in the past. -- Continue home beta-blocker as above -- Continue Eliquis for stroke prophylaxis  Lactic acidosis: Suspect low flow state.  Improved.  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  Best Practice (right click and "Reselect all SmartList Selections" daily)   Diet/type: NPO DVT prophylaxis: DOAC GI prophylaxis: N/A Lines: N/A Foley:  Yes,  and it is still needed Code Status:  full code Last date of multidisciplinary goals of care discussion  [discussed with patient and family at bedside on admission, confirms full code]  Labs   CBC: Recent Labs  Lab 03/29/23 1042  WBC 7.9  HGB 15.5*  HCT 49.4*  MCV 101.9*  PLT 205    Basic Metabolic Panel: Recent Labs  Lab 03/29/23 1042  NA 136  K 4.1  CL 99  CO2 18*  GLUCOSE 172*  BUN 16  CREATININE 1.35*  CALCIUM 9.6  MG 2.3   GFR: Estimated Creatinine Clearance: 30.4 mL/min (A) (by C-G formula based on SCr of 1.35 mg/dL (H)). Recent Labs  Lab 03/29/23 1042 03/29/23 1049 03/29/23 1235  WBC 7.9  --   --   LATICACIDVEN  --  9.0* 3.7*    Liver Function Tests: Recent Labs  Lab 03/29/23 1042  AST 76*  ALT 134*  ALKPHOS 177*  BILITOT 1.9*  PROT 8.3*  ALBUMIN 4.3   No results for input(s): "LIPASE", "AMYLASE" in the last 168 hours. No results for input(s): "AMMONIA" in the last 168 hours.  ABG    Component Value Date/Time   HCO3 19.3 (L) 03/29/2023 1042   ACIDBASEDEF 8.0 (H) 03/29/2023 1042   O2SAT 22.9 03/29/2023 1042     Coagulation Profile: No results for input(s): "INR", "PROTIME" in the last 168 hours.  Cardiac Enzymes: No results for input(s): "CKTOTAL", "CKMB", "CKMBINDEX", "TROPONINI" in the last 168 hours.  HbA1C: Hgb A1c MFr Bld  Date/Time Value Ref Range Status  03/12/2023 03:25 AM 6.1 (H) 4.8 - 5.6 % Final    Comment:    (NOTE) Pre diabetes:          5.7%-6.4%  Diabetes:              >6.4%  Glycemic control for   <7.0% adults with diabetes   11/05/2010 10:45 AM  <5.7 % Final   5.5 (NOTE)                                                                       According to the ADA Clinical Practice Recommendations for 2011, when HbA1c is used as a screening test:   >=6.5%   Diagnostic of Diabetes Mellitus           (if abnormal result  is confirmed)  5.7-6.4%   Increased risk of developing Diabetes Mellitus  References:Diagnosis and Classification of Diabetes Mellitus,Diabetes Care,2011,34(Suppl 1):S62-S69 and Standards of Medical Care  in         Diabetes - 2011,Diabetes Care,2011,34  (Suppl 1):S11-S61.    CBG: Recent Labs  Lab 03/29/23 1023  GLUCAP 165*    Review of Systems:   Unable to obtain due to difficulty communicating via BiPAP mask  Past Medical History:  She,  has a past medical history of Anxiety, Atrial fibrillation (HCC), Family history of adverse reaction to anesthesia, Hip fracture (HCC) (01/18/2022), Hypertension, Rheumatoid arthritis(714.0), and Subclinical hyperthyroidism.   Surgical History:   Past Surgical History:  Procedure Laterality Date   Arm surgery     fracture, right arm   CARDIOVERSION N/A 08/10/2014   Procedure: CARDIOVERSION;  Surgeon: Lars Masson, MD;  Location: [facility] ENDOSCOPY;  Service:  Cardiovascular;  Laterality: N/A;   CARDIOVERSION N/A 01/22/2022   Procedure: CARDIOVERSION;  Surgeon: Meriam Sprague, MD;  Location: [facility] ENDOSCOPY;  Service: Cardiovascular;  Laterality: N/A;   CATARACT EXTRACTION     DIRECT LARYNGOSCOPY N/A 09/02/2019   Procedure: DIRECT LARYNGOSCOPY WITH BIOPSY;  Surgeon: Drema Halon, MD;  Location: [facility];  Service: ENT;  Laterality: N/A;   HIP ARTHROPLASTY Right 01/19/2022   Procedure: ARTHROPLASTY BIPOLAR HIP (HEMIARTHROPLASTY);  Surgeon: Teryl Lucy, MD;  Location: [facility] ORS;  Service: Orthopedics;  Laterality: Right;   IR GASTROSTOMY TUBE MOD SED  10/17/2019   IR GASTROSTOMY TUBE REMOVAL  01/03/2020   TEE WITHOUT CARDIOVERSION N/A 01/22/2022   Procedure: TRANSESOPHAGEAL ECHOCARDIOGRAM (TEE);  Surgeon: Meriam Sprague, MD;  Location: [facility] ENDOSCOPY;  Service: Cardiovascular;  Laterality: N/A;   TUBAL LIGATION       Social History:   reports that she quit smoking about 3 years ago. Her smoking use included cigarettes. She started smoking about 57 years ago. She has a 13.6 pack-year smoking history. She has been exposed to tobacco smoke. She has never used smokeless tobacco. She reports that she does not currently use  alcohol. She reports that she does not use drugs.   Family History:  Her family history includes CAD (age of onset: 62) in her brother; Cancer in her brother, father, and sister; Diabetes in her brother; Heart attack (age of onset: 60) in her mother; Hypothyroidism in her mother and sister.   Allergies Allergies  Allergen Reactions   Clindamycin/Lincomycin Rash   Latex Rash    Latex gloves     Home Medications  Prior to Admission medications   Medication Sig Start Date End Date Taking? Authorizing Provider  acetaminophen (TYLENOL) 500 MG tablet Take 500 mg by mouth as needed for moderate pain.   Yes [provider]  ALPRAZolam Prudy Feeler) 1 MG tablet Take 1 mg by mouth 4 (four) times daily as needed for anxiety.   Yes [provider]  ELIQUIS 5 MG TABS tablet Take 1 tablet (5 mg total) by mouth 2 (two) times daily. 12/22/22  Yes Rollene Rotunda, MD  methimazole (TAPAZOLE) 5 MG tablet Take 5 mg by mouth daily. 03/04/23  Yes [provider]  metoprolol succinate (TOPROL-XL) 100 MG 24 hr tablet Take 1 tablet (100 mg total) by mouth 2 (two) times daily. Take with or immediately following a meal. 12/10/22  Yes Hochrein, Fayrene Fearing, MD  empagliflozin (JARDIANCE) 10 MG TABS tablet Take 1 tablet (10 mg total) by mouth daily. Patient not taking: Reported on 03/29/2023 12/17/22   Rollene Rotunda, MD  hydroxychloroquine (PLAQUENIL) 200 MG tablet Take 1 tablet (200 mg total) by mouth daily. 03/19/23   Fuller Plan, MD  Multiple Vitamin (MULTIVITAMIN WITH MINERALS) TABS tablet Take 1 tablet by mouth daily.     [provider]  oxyCODONE-acetaminophen (PERCOCET) 10-325 MG tablet Take 1 tablet by mouth every 4 (four) hours as needed for pain. 01/24/22   Armida Sans, PA-C  Vitamin D, Ergocalciferol, (DRISDOL) 1.25 MG (50000 UNIT) CAPS capsule Take 50,000 Units by mouth once a week. 12/28/21   [provider]     Critical care time:     CRITICAL CARE Performed by:  Karren Burly   Total critical care time: 35 minutes  Critical care time was exclusive of separately billable procedures and treating other patients.  Critical care was necessary to treat or prevent imminent or life-threatening deterioration.  Critical care  was time spent personally by me on the following activities: development of treatment plan with patient and/or surrogate as well as nursing, discussions with consultants, evaluation of patient's response to treatment, examination of patient, obtaining history from patient or surrogate, ordering and performing treatments and interventions, ordering and review of laboratory studies, ordering and review of radiographic studies, pulse oximetry and re-evaluation of patient's condition.  Karren Burly, MD See Loretha Stapler

## 2023-03-29 NOTE — Progress Notes (Signed)
A consult was received from an ED physician for vanc per pharmacy dosing.  The patient's profile has been reviewed for ht/wt/allergies/indication/available labs.   A one time order has been placed for vanc 1g.  Further antibiotics/pharmacy consults should be ordered by admitting physician if indicated.                       Thank you, Berkley Harvey 03/29/2023  11:50 AM

## 2023-03-29 NOTE — ED Notes (Signed)
ED TO INPATIENT HANDOFF REPORT  ED Nurse Name and Phone #: Jonny Ruiz [phone]  S Name/Age/Gender Rhonda Marshall 72 y.o. female Room/Bed: RESB/RESB  Code Status   Code Status: Full Code  Home/SNF/Other Patient oriented to: Self, place, time, situation Is this baseline? Yes   Triage Complete: Triage complete  Chief Complaint Acute decompensated heart failure (HCC) [I50.9]  Triage Note Pt arrived via POV. Pt had syncopal episode this AM. Pt has had progressive weakness over the past week. Pt's speech is mildly slurred. Son states that pt was taken off of her heart meds recently after IP stay in the hospital. C/o SOB.   Allergies Allergies  Allergen Reactions   Plaquenil [Hydroxychloroquine] Other (See Comments)    "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"    Clindamycin/Lincomycin Rash   Latex Rash    Latex gloves    Level of Care/Admitting Diagnosis ED Disposition     ED Disposition  Admit   Condition  --   Comment  Hospital Area: [facility] [[id number]]  Level of Care: ICU [6]  May admit patient to [facility] or [facility] if equivalent level of care is available:: No  Covid Evaluation: Asymptomatic - no recent exposure (last 10 days) testing not required  Diagnosis: Acute decompensated heart failure [facility]) [[id number]]  Admitting Physician: Karren Burly [[id number]]  Attending Physician: Karren Burly [phone]  Certification:: I certify this patient will need inpatient services for at least 2 midnights  Expected Medical Readiness: 04/05/2023          B Medical/Surgery History Past Medical History:  Diagnosis Date   Anxiety    Atrial fibrillation (HCC)    newly diagnosed 10/2010   Family history of adverse reaction to anesthesia    Sister's BP drops    Hip fracture (HCC) 01/18/2022   Hypertension    Rheumatoid arthritis(714.0)    Subclinical hyperthyroidism    Past Surgical History:  Procedure Laterality Date   Arm surgery     fracture,  right arm   CARDIOVERSION N/A 08/10/2014   Procedure: CARDIOVERSION;  Surgeon: Lars Masson, MD;  Location: [facility] ENDOSCOPY;  Service: Cardiovascular;  Laterality: N/A;   CARDIOVERSION N/A 01/22/2022   Procedure: CARDIOVERSION;  Surgeon: Meriam Sprague, MD;  Location: [facility] ENDOSCOPY;  Service: Cardiovascular;  Laterality: N/A;   CATARACT EXTRACTION     DIRECT LARYNGOSCOPY N/A 09/02/2019   Procedure: DIRECT LARYNGOSCOPY WITH BIOPSY;  Surgeon: Drema Halon, MD;  Location:  SURGERY CENTER;  Service: ENT;  Laterality: N/A;   HIP ARTHROPLASTY Right 01/19/2022   Procedure: ARTHROPLASTY BIPOLAR HIP (HEMIARTHROPLASTY);  Surgeon: Teryl Lucy, MD;  Location: [facility] ORS;  Service: Orthopedics;  Laterality: Right;   IR GASTROSTOMY TUBE MOD SED  10/17/2019   IR GASTROSTOMY TUBE REMOVAL  01/03/2020   TEE WITHOUT CARDIOVERSION N/A 01/22/2022   Procedure: TRANSESOPHAGEAL ECHOCARDIOGRAM (TEE);  Surgeon: Meriam Sprague, MD;  Location: [facility] ENDOSCOPY;  Service: Cardiovascular;  Laterality: N/A;   TUBAL LIGATION       A IV Location/Drains/Wounds Patient Lines/Drains/Airways Status     Active Line/Drains/Airways     Name Placement date Placement time Site Days   Peripheral IV 03/29/23 20 G Left Antecubital 03/29/23  1042  Antecubital  less than 1   Gastrostomy/Enterostomy Gastrostomy 20 Fr. 10/17/19  1244  --  1259            Intake/Output Last 24 hours No intake or output data in the 24 hours ending 03/29/23 1339  Labs/Imaging  Results for orders placed or performed during the hospital encounter of 03/29/23 (from the past 48 hour(s))  CBG monitoring, ED     Status: Abnormal   Collection Time: 03/29/23 10:23 AM  Result Value Ref Range   Glucose-Capillary 165 (H) 70 - 99 mg/dL    Comment: Glucose reference range applies only to samples taken after fasting for at least 8 hours.  Comprehensive metabolic panel     Status: Abnormal   Collection Time: 03/29/23 10:42 AM  Result  Value Ref Range   Sodium 136 135 - 145 mmol/L   Potassium 4.1 3.5 - 5.1 mmol/L   Chloride 99 98 - 111 mmol/L   CO2 18 (L) 22 - 32 mmol/L   Glucose, Bld 172 (H) 70 - 99 mg/dL    Comment: Glucose reference range applies only to samples taken after fasting for at least 8 hours.   BUN 16 8 - 23 mg/dL   Creatinine, Ser 5.64 (H) 0.44 - 1.00 mg/dL   Calcium 9.6 8.9 - 33.2 mg/dL   Total Protein 8.3 (H) 6.5 - 8.1 g/dL   Albumin 4.3 3.5 - 5.0 g/dL   AST 76 (H) 15 - 41 U/L   ALT 134 (H) 0 - 44 U/L   Alkaline Phosphatase 177 (H) 38 - 126 U/L   Total Bilirubin 1.9 (H) 0.3 - 1.2 mg/dL   GFR, Estimated 41 (L) >60 mL/min    Comment: (NOTE) Calculated using the CKD-EPI Creatinine Equation (2021)    Anion gap 19 (H) 5 - 15    Comment: Performed at [facility], [address] [place], [place] [address]  CBC     Status: Abnormal   Collection Time: 03/29/23 10:42 AM  Result Value Ref Range   WBC 7.9 4.0 - 10.5 K/uL   RBC 4.85 3.87 - 5.11 MIL/uL   Hemoglobin 15.5 (H) 12.0 - 15.0 g/dL   HCT 41.6 (H) 60.6 - 30.1 %   MCV 101.9 (H) 80.0 - 100.0 fL   MCH 32.0 26.0 - 34.0 pg   MCHC 31.4 30.0 - 36.0 g/dL   RDW 60.1 (H) 09.3 - 23.5 %   Platelets 205 150 - 400 K/uL   nRBC 0.0 0.0 - 0.2 %    Comment: Performed at [facility], [address], [place], [place] [address]  Brain natriuretic peptide     Status: Abnormal   Collection Time: 03/29/23 10:42 AM  Result Value Ref Range   B Natriuretic Peptide >4,500.0 (H) 0.0 - 100.0 pg/mL    Comment: Performed at [facility], [address], [place], [place] [address]  Magnesium     Status: None   Collection Time: 03/29/23 10:42 AM  Result Value Ref Range   Magnesium 2.3 1.7 - 2.4 mg/dL    Comment: Performed at [facility], [address], [place], [place] [address]  Blood gas, venous     Status: Abnormal   Collection Time: 03/29/23 10:42 AM  Result Value Ref Range   pH, Ven 7.24  (L) 7.25 - 7.43   pCO2, Ven 45 44 - 60 mmHg   pO2, Ven <31 (LL) 32 - 45 mmHg    Comment: CRITICAL RESULT CALLED TO, READ BACK BY AND VERIFIED WITH: BOWEN, M 03/29/2023 1053 COATNEY, S     Bicarbonate 19.3 (L) 20.0 - 28.0 mmol/L   Acid-base deficit 8.0 (H) 0.0 - 2.0 mmol/L   O2 Saturation 22.9 %   Patient temperature 37.0  Comment: Performed at [facility], [address] [place], [place] [address]  Troponin I (High Sensitivity)     Status: Abnormal   Collection Time: 03/29/23 10:42 AM  Result Value Ref Range   Troponin I (High Sensitivity) 22 (H) <18 ng/L    Comment: (NOTE) Elevated high sensitivity troponin I (hsTnI) values and significant  changes across serial measurements may suggest ACS but many other  chronic and acute conditions are known to elevate hsTnI results.  Refer to the "Links" section for chest pain algorithms and additional  guidance. Performed at [facility], [address], [place], [place] [address]   TSH     Status: None   Collection Time: 03/29/23 10:42 AM  Result Value Ref Range   TSH 2.047 0.350 - 4.500 uIU/mL    Comment: Performed by a 3rd Generation assay with a functional sensitivity of <=0.01 uIU/mL. Performed at [facility], [address] [place], [place] [address]   Resp panel by RT-PCR (RSV, Flu A&B, Covid)     Status: None   Collection Time: 03/29/23 10:43 AM   Specimen: Nasal Swab  Result Value Ref Range   SARS Coronavirus 2 by RT PCR NEGATIVE NEGATIVE    Comment: (NOTE) SARS-CoV-2 target nucleic acids are NOT DETECTED.  The SARS-CoV-2 RNA is generally detectable in upper respiratory specimens during the acute phase of infection. The lowest concentration of SARS-CoV-2 viral copies this assay can detect is 138 copies/mL. A negative result does not preclude SARS-Cov-2 infection and should not be used as the sole basis for treatment or other patient management decisions. A  negative result may occur with  improper specimen collection/handling, submission of specimen other than nasopharyngeal swab, presence of viral mutation(s) within the areas targeted by this assay, and inadequate number of viral copies(<138 copies/mL). A negative result must be combined with clinical observations, patient history, and epidemiological information. The expected result is Negative.  Fact Sheet for Patients:  BloggerCourse.com  Fact Sheet for Healthcare Providers:  SeriousBroker.it  This test is no t yet approved or cleared by the [place] FDA and  has been authorized for detection and/or diagnosis of SARS-CoV-2 by FDA under an Emergency Use Authorization (EUA). This EUA will remain  in effect (meaning this test can be used) for the duration of the COVID-19 declaration under Section 564(b)(1) of the Act, 21 U.S.C.section 360bbb-3(b)(1), unless the authorization is terminated  or revoked sooner.       Influenza A by PCR NEGATIVE NEGATIVE   Influenza B by PCR NEGATIVE NEGATIVE    Comment: (NOTE) The Xpert Xpress SARS-CoV-2/FLU/RSV plus assay is intended as an aid in the diagnosis of influenza from Nasopharyngeal swab specimens and should not be used as a sole basis for treatment. Nasal washings and aspirates are unacceptable for Xpert Xpress SARS-CoV-2/FLU/RSV testing.  Fact Sheet for Patients: BloggerCourse.com  Fact Sheet for Healthcare Providers: SeriousBroker.it  This test is not yet approved or cleared by the [place] FDA and has been authorized for detection and/or diagnosis of SARS-CoV-2 by FDA under an Emergency Use Authorization (EUA). This EUA will remain in effect (meaning this test can be used) for the duration of the COVID-19 declaration under Section 564(b)(1) of the Act, 21 U.S.C. section 360bbb-3(b)(1), unless the authorization is terminated  or revoked.     Resp Syncytial Virus by PCR NEGATIVE NEGATIVE    Comment: (NOTE) Fact Sheet for Patients: BloggerCourse.com  Fact Sheet for Healthcare Providers: SeriousBroker.it  This  test is not yet approved or cleared by the [place] and has been authorized for detection and/or diagnosis of SARS-CoV-2 by FDA under an Emergency Use Authorization (EUA). This EUA will remain in effect (meaning this test can be used) for the duration of the COVID-19 declaration under Section 564(b)(1) of the Act, 21 U.S.C. section 360bbb-3(b)(1), unless the authorization is terminated or revoked.  Performed at [facility], [address], [place], [place] [address]   I-Stat CG4 Lactic Acid     Status: Abnormal   Collection Time: 03/29/23 10:49 AM  Result Value Ref Range   Lactic Acid, Venous 9.0 (HH) 0.5 - 1.9 mmol/L  Urinalysis, Routine w reflex microscopic -Urine, Clean Catch     Status: Abnormal   Collection Time: 03/29/23 12:16 PM  Result Value Ref Range   Color, Urine STRAW (A) YELLOW   APPearance CLEAR CLEAR   Specific Gravity, Urine 1.004 (L) 1.005 - 1.030   pH 6.0 5.0 - 8.0   Glucose, UA NEGATIVE NEGATIVE mg/dL   Hgb urine dipstick MODERATE (A) NEGATIVE   Bilirubin Urine NEGATIVE NEGATIVE   Ketones, ur NEGATIVE NEGATIVE mg/dL   Protein, ur NEGATIVE NEGATIVE mg/dL   Nitrite NEGATIVE NEGATIVE   Leukocytes,Ua NEGATIVE NEGATIVE   RBC / HPF 0-5 0 - 5 RBC/hpf   WBC, UA 0-5 0 - 5 WBC/hpf   Bacteria, UA RARE (A) NONE SEEN   Squamous Epithelial / HPF 0-5 0 - 5 /HPF   Mucus PRESENT     Comment: Performed at [facility], [address], [place], [place] [address]  Troponin I (High Sensitivity)     Status: Abnormal   Collection Time: 03/29/23 12:30 PM  Result Value Ref Range   Troponin I (High Sensitivity) 20 (H) <18 ng/L    Comment: (NOTE) Elevated high sensitivity troponin I (hsTnI)  values and significant  changes across serial measurements may suggest ACS but many other  chronic and acute conditions are known to elevate hsTnI results.  Refer to the "Links" section for chest pain algorithms and additional  guidance. Performed at [facility], [address] [place], [place] [address]   I-Stat CG4 Lactic Acid     Status: Abnormal   Collection Time: 03/29/23 12:35 PM  Result Value Ref Range   Lactic Acid, Venous 3.7 (HH) 0.5 - 1.9 mmol/L  POC CBG, ED     Status: Abnormal   Collection Time: 03/29/23  1:31 PM  Result Value Ref Range   Glucose-Capillary 143 (H) 70 - 99 mg/dL    Comment: Glucose reference range applies only to samples taken after fasting for at least 8 hours.   DG Chest Port 1 View  Result Date: 03/29/2023 CLINICAL DATA:  Shortness of breath.  Syncope. EXAM: PORTABLE CHEST 1 VIEW COMPARISON:  03/13/2023 FINDINGS: The cardio pericardial silhouette is enlarged. Bibasilar atelectasis/infiltrate with small bilateral pleural effusions, progressive in the interval. Underlying chronic interstitial lung disease evident. Bones are diffusely demineralized. Telemetry leads overlie the chest. IMPRESSION: Progressive bibasilar atelectasis/infiltrate with small bilateral pleural effusions. Electronically Signed   By: Kennith Center M.D.   On: 03/29/2023 10:54    Pending Labs Unresulted Labs (From admission, onward)     Start     Ordered   03/30/23 0500  CBC  Tomorrow morning,   R        03/29/23 1224   03/30/23 0500  Basic metabolic panel  Tomorrow morning,   R  03/29/23 1224   03/30/23 0500  Magnesium  Tomorrow morning,   R        03/29/23 1224   03/30/23 0500  Phosphorus  Tomorrow morning,   R        03/29/23 1224   03/29/23 1033  T4, free  Once,   URGENT        03/29/23 1032   03/29/23 1031  Blood culture (routine x 2)  BLOOD CULTURE X 2,   R      03/29/23 1030            Vitals/Pain Today's Vitals   03/29/23 1130 03/29/23  1145 03/29/23 1216 03/29/23 1300  BP: (!) 146/106 (!) 144/86  (!) 134/114  Pulse: 71 71  (!) 116  Resp: (!) 23 (!) 22  20  Temp:   98.1 F (36.7 C)   TempSrc:   Rectal   SpO2: 100% 100%  100%  Weight:      Height:        Isolation Precautions No active isolations  Medications Medications  hydroxychloroquine (PLAQUENIL) tablet 200 mg (0 mg Oral Hold 03/29/23 1331)  apixaban (ELIQUIS) tablet 5 mg (0 mg Oral Hold 03/29/23 1331)  metoprolol tartrate (LOPRESSOR) tablet 50 mg (0 mg Oral Hold 03/29/23 1332)  isosorbide dinitrate (ISORDIL) tablet 10 mg (has no administration in time range)  hydrALAZINE (APRESOLINE) tablet 25 mg (0 mg Oral Hold 03/29/23 1331)  docusate sodium (COLACE) capsule 100 mg (has no administration in time range)  polyethylene glycol (MIRALAX / GLYCOLAX) packet 17 g (has no administration in time range)  acetaminophen (TYLENOL) tablet 650 mg (has no administration in time range)  ondansetron (ZOFRAN) injection 4 mg (has no administration in time range)  insulin aspart (novoLOG) injection 0-15 Units (2 Units Subcutaneous Given 03/29/23 1332)  metoprolol tartrate (LOPRESSOR) injection 2.5 mg (2.5 mg Intravenous Given 03/29/23 1104)  furosemide (LASIX) injection 40 mg (40 mg Intravenous Given 03/29/23 1104)  ceFEPIme (MAXIPIME) 2 g in sodium chloride 0.9 % 100 mL IVPB (0 g Intravenous Stopped 03/29/23 1229)  vancomycin (VANCOCIN) IVPB 1000 mg/200 mL premix (0 mg Intravenous Stopped 03/29/23 1303)    Mobility walks     Focused Assessments    R Recommendations: See Admitting Provider Note  Report given to:   Additional Notes:  On BIPAP

## 2023-03-29 NOTE — Plan of Care (Signed)
  Problem: Pain Managment: Goal: General experience of comfort will improve Outcome: Progressing   Problem: Safety: Goal: Ability to remain free from injury will improve Outcome: Progressing   

## 2023-03-29 NOTE — Progress Notes (Signed)
   03/29/23 2340  BiPAP/CPAP/SIPAP  BiPAP/CPAP/SIPAP Pt Type Adult  BiPAP/CPAP/SIPAP V60  Mask Type Full face mask  Mask Size Medium  Set Rate 10 breaths/min  Respiratory Rate 28 breaths/min  IPAP 12 cmH20  EPAP 5 cmH2O  FiO2 (%) 30 %  Minute Ventilation 10.4  Leak 20  Peak Inspiratory Pressure (PIP) 13  Tidal Volume (Vt) 406  Patient Home Equipment No  Auto Titrate No  Press High Alarm 30 cmH2O  Press Low Alarm 5 cmH2O

## 2023-03-29 NOTE — ED Triage Notes (Signed)
Pt arrived via POV. Pt had syncopal episode this AM. Pt has had progressive weakness over the past week. Pt's speech is mildly slurred. Son states that pt was taken off of her heart meds recently after IP stay in the hospital. C/o SOB.

## 2023-03-30 ENCOUNTER — Inpatient Hospital Stay (HOSPITAL_COMMUNITY): Payer: 59

## 2023-03-30 DIAGNOSIS — I5023 Acute on chronic systolic (congestive) heart failure: Secondary | ICD-10-CM

## 2023-03-30 DIAGNOSIS — N1832 Chronic kidney disease, stage 3b: Secondary | ICD-10-CM

## 2023-03-30 DIAGNOSIS — R1311 Dysphagia, oral phase: Secondary | ICD-10-CM

## 2023-03-30 DIAGNOSIS — I509 Heart failure, unspecified: Secondary | ICD-10-CM

## 2023-03-30 LAB — BASIC METABOLIC PANEL
Anion gap: 19 — ABNORMAL HIGH (ref 5–15)
Anion gap: 11 (ref 5–15)
BUN: 16 mg/dL (ref 8–23)
BUN: 17 mg/dL (ref 8–23)
CO2: 26 mmol/L (ref 22–32)
CO2: 27 mmol/L (ref 22–32)
Calcium: 9.7 mg/dL (ref 8.9–10.3)
Calcium: 9.1 mg/dL (ref 8.9–10.3)
Chloride: 95 mmol/L — ABNORMAL LOW (ref 98–111)
Chloride: 96 mmol/L — ABNORMAL LOW (ref 98–111)
Creatinine, Ser: 1.23 mg/dL — ABNORMAL HIGH (ref 0.44–1.00)
Creatinine, Ser: 1.21 mg/dL — ABNORMAL HIGH (ref 0.44–1.00)
GFR, Estimated: 46 mL/min — ABNORMAL LOW (ref >60)
GFR, Estimated: 47 mL/min — ABNORMAL LOW (ref >60)
Glucose, Bld: 78 mg/dL (ref 70–99)
Glucose, Bld: 137 mg/dL — ABNORMAL HIGH (ref 70–99)
Potassium: 2.7 mmol/L — CL (ref 3.5–5.1)
Potassium: 3.6 mmol/L (ref 3.5–5.1)
Sodium: 140 mmol/L (ref 135–145)
Sodium: 134 mmol/L — ABNORMAL LOW (ref 135–145)

## 2023-03-30 LAB — CBC
HCT: 44.1 % (ref 36.0–46.0)
Hemoglobin: 14.6 g/dL (ref 12.0–15.0)
MCH: 31.7 pg (ref 26.0–34.0)
MCHC: 33.1 g/dL (ref 30.0–36.0)
MCV: 95.7 fL (ref 80.0–100.0)
Platelets: 177 10*3/uL (ref 150–400)
RBC: 4.61 MIL/uL (ref 3.87–5.11)
RDW: 16.4 % — ABNORMAL HIGH (ref 11.5–15.5)
WBC: 7.4 10*3/uL (ref 4.0–10.5)
nRBC: 0 % (ref 0.0–0.2)

## 2023-03-30 LAB — GLUCOSE, CAPILLARY
Glucose-Capillary: 114 mg/dL — ABNORMAL HIGH (ref 70–99)
Glucose-Capillary: 116 mg/dL — ABNORMAL HIGH (ref 70–99)
Glucose-Capillary: 129 mg/dL — ABNORMAL HIGH (ref 70–99)
Glucose-Capillary: 154 mg/dL — ABNORMAL HIGH (ref 70–99)
Glucose-Capillary: 70 mg/dL (ref 70–99)
Glucose-Capillary: 93 mg/dL (ref 70–99)

## 2023-03-30 LAB — ECHOCARDIOGRAM COMPLETE
Area-P 1/2: 5.54 cm2
Calc EF: 34.5 %
Height: 61 in
S' Lateral: 3.6 cm
Single Plane A2C EF: 34 %
Single Plane A4C EF: 37 %
Weight: 1717.82 [oz_av]

## 2023-03-30 LAB — MAGNESIUM: Magnesium: 1.8 mg/dL (ref 1.7–2.4)

## 2023-03-30 LAB — PHOSPHORUS: Phosphorus: 2.8 mg/dL (ref 2.5–4.6)

## 2023-03-30 MED ORDER — SACUBITRIL-VALSARTAN 24-26 MG PO TABS
1.0000 | ORAL_TABLET | Freq: Two times a day (BID) | ORAL | Status: DC
  Administered 2023-03-30 – 2023-03-31 (×3): 1 via ORAL
  Filled 2023-03-30 (×3): qty 1

## 2023-03-30 MED ORDER — POTASSIUM CHLORIDE 10 MEQ/100ML IV SOLN
10.0000 meq | INTRAVENOUS | Status: AC
  Administered 2023-03-30 (×4): 10 meq via INTRAVENOUS
  Filled 2023-03-30 (×4): qty 100

## 2023-03-30 MED ORDER — MAGNESIUM SULFATE 2 GM/50ML IV SOLN
2.0000 g | Freq: Once | INTRAVENOUS | Status: AC
  Administered 2023-03-30: 2 g via INTRAVENOUS
  Filled 2023-03-30: qty 50

## 2023-03-30 MED ORDER — POTASSIUM CHLORIDE CRYS ER 20 MEQ PO TBCR
40.0000 meq | EXTENDED_RELEASE_TABLET | Freq: Once | ORAL | Status: AC
  Administered 2023-03-30: 40 meq via ORAL
  Filled 2023-03-30: qty 2

## 2023-03-30 MED ORDER — FUROSEMIDE 10 MG/ML IJ SOLN
40.0000 mg | INTRAMUSCULAR | Status: AC
  Administered 2023-03-30: 40 mg via INTRAVENOUS
  Filled 2023-03-30: qty 4

## 2023-03-30 NOTE — Progress Notes (Signed)
   03/30/23 2317  BiPAP/CPAP/SIPAP  BiPAP/CPAP/SIPAP Pt Type Adult  BiPAP/CPAP/SIPAP V60  Mask Type Full face mask  Mask Size Medium  Set Rate 10 breaths/min  Respiratory Rate 15 breaths/min  IPAP 12 cmH20  EPAP 5 cmH2O  FiO2 (%) 30 %  Flow Rate 0 lpm  Minute Ventilation 7.4  Leak 20  Peak Inspiratory Pressure (PIP) 12  Tidal Volume (Vt) 484  Patient Home Equipment No  Auto Titrate No  Press High Alarm 30 cmH2O  Press Low Alarm 5 cmH2O

## 2023-03-30 NOTE — Progress Notes (Signed)
  Echocardiogram 2D Echocardiogram has been performed.  Janalyn Harder 03/30/2023, 8:42 AM

## 2023-03-30 NOTE — Progress Notes (Signed)
NAMEParlie Marshall, MRN:  [id number], DOB:  July 06, 1951, LOS: 1 ADMISSION DATE:  03/29/2023, CONSULTATION DATE:  03/30/23  REFERRING MD:  ED, CHIEF COMPLAINT: Dyspnea on exertion  History of Present Illness:  72 year old woman history of CHF EF 30% permanent atrial fibrillation recent hospitalization for community-acquired pneumonia where all evidence paced medications for heart failure except beta-blocker were stopped presents with decompensated heart failure, acute hypoxemic respiratory failure on BiPAP.  She was hospitalized.  Stopped Entresto.  Beta-blocker was continued.  Been off this now for about 2 weeks since discharge.  No alternatives were offered.  Noted her blood pressures are much higher than baseline 90 over 60s.  130s over 110s.  Consistently her 72 last several days.  Obtained consultation via outpatient cardiology office.  No changes were made.  Progressive dyspnea over the last week.  Acutely worse over the last 24 hours or so.  Blood pressure is elevated from baseline.  Narrow pulse pressure.  Chest x-ray with significant pulmonary edema and bilateral pleural effusions enlarged compared to 03/13/2023 on my review and interpretation.  Pertinent  Medical History  Congestive heart failure EF 30%, permanent atrial fibrillation  Significant Hospital Events: Including procedures, antibiotic start and stop dates in addition to other pertinent events   03/29/2023 presents with progressively worsening shortness of breath presumed due to CHF exacerbation  Interim History / Subjective:  Breathing feels improved today  Objective   Blood pressure 114/62, pulse 77, temperature 98.4 F (36.9 C), resp. rate (!) 24, height 5\' 1"  (1.549 m), weight 48.7 kg, SpO2 97%.    FiO2 (%):  [30 %] 30 %   Intake/Output Summary (Last 24 hours) at 03/30/2023 0753 Last data filed at 03/30/2023 0700 Gross per 24 hour  Intake 321.09 ml  Output 3375 ml  Net -3053.91 ml   Filed Weights   03/29/23 1025  03/30/23 0500  Weight: 58 kg 48.7 kg    Examination: General: Chronically ill-appearing elderly woman lying in bed no acute distress HENT: [place]/AT, eyes anicteric Lungs: Comfortably on 2 L nasal cannula, decreased basilar breath sounds, no anterior rales. Cardiovascular: S1 and 2, regular rate, irregular rhythm Abdomen: Soft, nontender Neuro: Awake, answering questions, globally weak but moving extremities   I/O:  -3L  Weight: no comparison this admission  K + 2.7 BUN 16 Cr 1.23 WBC 7.4 H/H 14.6/44.1     Resolved Hospital Problem list    Lactic acidosis: Suspect low flow state.  Improved.  Assessment & Plan:  Acute decompensated HFrEF: Low suspicion for CAD with negative Lexi scan in 2018.  Decompensation and sign of atrial fibrillation which is chronic but also holding evidence-based medicines after recent hospitalization.  Her afterload is through the roof, exacerbated by atrial fibrillation. -- Continue diuresis - Continue PTA metoprolol; she has improved and does not appear to be in further decompensation.  Is helping with rate control. -Resume Entresto,; renal function is at baseline --Continue Isordil and hydralazine  Acute hypoxemic respiratory failure: due to acute pulmonary edema and bilateral pleural effusions --Wean submental oxygen as able - Continue diuresiss -Agree with holding antibiotics  Atrial fibrillation: Borderline RVR.  Permanent A-fib.  Has failed multiple measures to get out of A-fib in the past. -- Continue metoprolol and apixaban -Monitor on telemetry  CKD 3B -renally dose meds -avoid nephrotoxins  Hypokalemia -repleted -supplemental Mg+  -recheck BMP this afternoon  Dysphagia per SLP evaluation 8/28//24 - Resume dysphagia 3 diet -If concerns for aspiration ongoing will reconsult SLP.  Stable  to transfer to med tele floor. TRH to assume care tomorrow.  Best Practice (right click and "Reselect all SmartList Selections" daily)    Diet/type: dysphagia diet (see orders) DVT prophylaxis: DOAC GI prophylaxis: N/A Lines: N/A Foley:  Yes, and it is still needed Code Status:  full code Last date of multidisciplinary goals of care discussion [discussed with patient and family at bedside on admission, confirms full code]  Labs   CBC: Recent Labs  Lab 03/29/23 1042 03/30/23 0249  WBC 7.9 7.4  HGB 15.5* 14.6  HCT 49.4* 44.1  MCV 101.9* 95.7  PLT 205 177    Basic Metabolic Panel: Recent Labs  Lab 03/29/23 1042 03/30/23 0249  NA 136 140  K 4.1 2.7*  CL 99 95*  CO2 18* 26  GLUCOSE 172* 78  BUN 16 16  CREATININE 1.35* 1.23*  CALCIUM 9.6 9.7  MG 2.3 1.8  PHOS  --  2.8   GFR: Estimated Creatinine Clearance: 30.7 mL/min (A) (by C-G formula based on SCr of 1.23 mg/dL (H)). Recent Labs  Lab 03/29/23 1042 03/29/23 1049 03/29/23 1235 03/30/23 0249  WBC 7.9  --   --  7.4  LATICACIDVEN  --  9.0* 3.7*  --     Liver Function Tests: Recent Labs  Lab 03/29/23 1042  AST 76*  ALT 134*  ALKPHOS 177*  BILITOT 1.9*  PROT 8.3*  ALBUMIN 4.3   No results for input(s): "LIPASE", "AMYLASE" in the last 168 hours. No results for input(s): "AMMONIA" in the last 168 hours.  ABG    Component Value Date/Time   HCO3 19.3 (L) 03/29/2023 1042   ACIDBASEDEF 8.0 (H) 03/29/2023 1042   O2SAT 22.9 03/29/2023 1042     Critical care time: n/a    Steffanie Dunn, DO 03/30/23 9:26 AM [facility]  For contact information, see Amion. If no response to pager, please call PCCM consult pager. After hours, 7PM- 7AM, please call Elink.

## 2023-03-30 NOTE — Progress Notes (Signed)
eLink Physician-Brief Progress Note Patient Name: Yisela Hammerstrom DOB: 12/13/1950 MRN: [id number]   Date of Service  03/30/2023  HPI/Events of Note  Notified of hypokalemia with K 2.7, crea 1.23.  eICU Interventions  Replete K - potassium chloride IV x 4 doses ordered.     Intervention Category Intermediate Interventions: Electrolyte abnormality - evaluation and management  Larinda Buttery 03/30/2023, 4:47 AM

## 2023-03-30 NOTE — Care Management (Signed)
Transition of Care [facility]) - Inpatient Brief Assessment   Patient Details  Name: Rhonda Marshall MRN: [id number] Date of Birth: 04-Apr-1951  Transition of Care [facility]) CM/SW Contact:    Lavenia Atlas, RN Phone Number: 03/30/2023, 2:36 PM   Clinical Narrative: Per chart review patient is currently in [facility] ICU for acute decompensated HF, EF 30%, perm afib, on Eliquis and metoprolol, currently on 3L [place].  TOC following for any dc needs.  If any needs arise consult TOC for any needs.    Transition of Care Asessment: Insurance and Status: Insurance coverage has been reviewed Patient has primary care physician: Yes Home environment has been reviewed: from home w/children Prior level of function:: independent Prior/Current Home Services: No current home services Social Determinants of Health Reivew: SDOH reviewed no interventions necessary Readmission risk has been reviewed: Yes Transition of care needs: no transition of care needs at this time

## 2023-03-30 NOTE — Plan of Care (Signed)
  Problem: Education: Goal: Knowledge of General Education information will improve Description: Including pain rating scale, medication(s)/side effects and non-pharmacologic comfort measures Outcome: Progressing   Problem: Activity: Goal: Risk for activity intolerance will decrease Outcome: Progressing   

## 2023-03-30 NOTE — Progress Notes (Signed)
PHARMACY - PHYSICIAN COMMUNICATION CRITICAL VALUE ALERT - BLOOD CULTURE IDENTIFICATION (BCID)  Rhonda Marshall is an 72 y.o. female who presented to [name] on 03/29/2023 with a chief complaint of acute decompensated HF.   Assessment: 1/4 bottles growing GPR  Name of physician (or Provider) Contacted: Karie Fetch, DO  Current antibiotics:  Cefepime 2g IV x1 on 9/15 Vancomycin 1g x1 on 9/15  Changes to prescribed antibiotics recommended:  No changes at this time--recommend awaiting final blood culture growth   Cherylin Mylar, PharmD Clinical Pharmacist  9/16/20242:21 PM

## 2023-03-31 DIAGNOSIS — R7989 Other specified abnormal findings of blood chemistry: Secondary | ICD-10-CM | POA: Diagnosis not present

## 2023-03-31 DIAGNOSIS — I4811 Longstanding persistent atrial fibrillation: Secondary | ICD-10-CM

## 2023-03-31 DIAGNOSIS — Z7901 Long term (current) use of anticoagulants: Secondary | ICD-10-CM | POA: Diagnosis not present

## 2023-03-31 DIAGNOSIS — E876 Hypokalemia: Secondary | ICD-10-CM | POA: Diagnosis not present

## 2023-03-31 DIAGNOSIS — I5023 Acute on chronic systolic (congestive) heart failure: Secondary | ICD-10-CM | POA: Diagnosis not present

## 2023-03-31 LAB — BASIC METABOLIC PANEL
Anion gap: 13 (ref 5–15)
BUN: 16 mg/dL (ref 8–23)
CO2: 25 mmol/L (ref 22–32)
Calcium: 9.4 mg/dL (ref 8.9–10.3)
Chloride: 97 mmol/L — ABNORMAL LOW (ref 98–111)
Creatinine, Ser: 1.17 mg/dL — ABNORMAL HIGH (ref 0.44–1.00)
GFR, Estimated: 49 mL/min — ABNORMAL LOW (ref >60)
Glucose, Bld: 78 mg/dL (ref 70–99)
Potassium: 4 mmol/L (ref 3.5–5.1)
Sodium: 135 mmol/L (ref 135–145)

## 2023-03-31 LAB — GLUCOSE, CAPILLARY
Glucose-Capillary: 131 mg/dL — ABNORMAL HIGH (ref 70–99)
Glucose-Capillary: 144 mg/dL — ABNORMAL HIGH (ref 70–99)
Glucose-Capillary: 161 mg/dL — ABNORMAL HIGH (ref 70–99)
Glucose-Capillary: 166 mg/dL — ABNORMAL HIGH (ref 70–99)
Glucose-Capillary: 94 mg/dL (ref 70–99)
Glucose-Capillary: 95 mg/dL (ref 70–99)

## 2023-03-31 LAB — MAGNESIUM: Magnesium: 3.3 mg/dL — ABNORMAL HIGH (ref 1.7–2.4)

## 2023-03-31 MED ORDER — OXYCODONE HCL 5 MG PO TABS
5.0000 mg | ORAL_TABLET | Freq: Three times a day (TID) | ORAL | Status: DC | PRN
  Administered 2023-03-31 – 2023-04-02 (×5): 5 mg via ORAL
  Filled 2023-03-31 (×5): qty 1

## 2023-03-31 MED ORDER — AMIODARONE HCL IN DEXTROSE 360-4.14 MG/200ML-% IV SOLN
60.0000 mg/h | INTRAVENOUS | Status: AC
  Administered 2023-03-31 (×2): 60 mg/h via INTRAVENOUS
  Filled 2023-03-31 (×3): qty 200

## 2023-03-31 MED ORDER — OXYCODONE HCL 5 MG PO TABS
10.0000 mg | ORAL_TABLET | Freq: Once | ORAL | Status: AC
  Administered 2023-03-31: 10 mg via ORAL
  Filled 2023-03-31: qty 2

## 2023-03-31 MED ORDER — ALPRAZOLAM 0.5 MG PO TABS
1.0000 mg | ORAL_TABLET | Freq: Two times a day (BID) | ORAL | Status: DC | PRN
  Administered 2023-03-31 – 2023-04-02 (×3): 1 mg via ORAL
  Filled 2023-03-31 (×3): qty 2

## 2023-03-31 MED ORDER — AMIODARONE LOAD VIA INFUSION
150.0000 mg | Freq: Once | INTRAVENOUS | Status: AC
  Administered 2023-03-31: 150 mg via INTRAVENOUS
  Filled 2023-03-31: qty 83.34

## 2023-03-31 MED ORDER — AMIODARONE HCL IN DEXTROSE 360-4.14 MG/200ML-% IV SOLN
30.0000 mg/h | INTRAVENOUS | Status: DC
  Administered 2023-04-01 – 2023-04-02 (×2): 30 mg/h via INTRAVENOUS
  Filled 2023-03-31 (×4): qty 200

## 2023-03-31 NOTE — Assessment & Plan Note (Signed)
In rapid afib at present. Lopressor 50 mg bid restarted. Failed to keep in NSR after DCCV while on amiodarone.  Therefore it was stopped as a outpatient.  Given low BP and rapid afib while on lopressor. Discussed with cards. Will start amiodarone IV load/infusion for better rate control. Already on Eliquis bid.  Stop hydralazine as this can exacerbate her tachycardia. Scr is normal.

## 2023-03-31 NOTE — Progress Notes (Signed)
   03/31/23 2225  BiPAP/CPAP/SIPAP  BiPAP/CPAP/SIPAP Pt Type Adult  BiPAP/CPAP/SIPAP V60  Reason BIPAP/CPAP not in use Non-compliant (Patient refused BiPAP qhs.  Patient states that she will consider it if she starts feeling SOB but does not want to wear it otherwise.)

## 2023-03-31 NOTE — Assessment & Plan Note (Signed)
Repleted with IV Kcl. resolved

## 2023-03-31 NOTE — Plan of Care (Signed)
Problem: Clinical Measurements: Goal: Ability to maintain clinical measurements within normal limits will improve Outcome: Progressing Goal: Will remain free from infection Outcome: Progressing Goal: Diagnostic test results will improve Outcome: Progressing Goal: Respiratory complications will improve Outcome: Progressing

## 2023-03-31 NOTE — Hospital Course (Addendum)
The patient  is a 72 year old woman history of CHF EF 30% permanent atrial fibrillation recent hospitalization for community-acquired pneumonia where all evidence paced medications for heart failure except beta-blocker were stopped presents with decompensated heart failure, acute hypoxemic respiratory failure on BiPAP.  She was hospitalized and her Sherryll Burger was stopped and no alternatives were offered.  She began to have progressive dyspnea last week and was worse 24 hours prior to admission.  Chest x-ray was done and showed significant pulmonary edema and bilateral pleural effusions compared to 03/13/2023.  She was admitted to the PCCM service and transferred to the hospitalist service on 03/31/2023.  Echo was done and showed EF of 30 to 35%.  She was diuresed and now being placed on IV amiodarone drip given her persistent A-fib with RVR.  Diuresis has been held today and cardiology recommends not resuming given her history of hypertension.  She was on IV amiodarone and was transitioned to oral amiodarone taper.  Cardiology felt that she could be safely discharged and uptitrated her beta-blocker.  Her repeat chest x-ray showed no active disease and now she is stable for discharge and ambulated without issues or dyspnea and will need to follow-up with PCP and cardiology outpatient setting.   Interim History:  Acute on Chronic Systolic CHF (congestive heart failure) (HCC) -Admitted to ICU under care of PCCM. Underwent diuresis with IV lasix and responded quite well and quickly to the diuresis -GDMT is limited given the she was taken off of empagliflozin due to urinary tract infection history and is not able to tolerate Entresto due to hypotension -Echo was done and showed an EF of 30 to 35% with left ventricule demonstrating global hypokinesis with the left ventricular diastolic parameters being indeterminate -Strict I's and O's and daily weights -Continues remain in atrial fibrillation but has better rate  control on the IV amiodarone and Toprol -Unable to tolerate Entresto and spironolactone due to hypotension -Cardiology has now recommended transition to oral amiodarone and switching her to the amiodarone taper and increased her Toprol-XL to 100 mg daily -Cardiology still does not have a clear etiology of why her left ventricular ejection fraction has declined but may be related to uncontrolled atrial fibrillation -Cardiology feels that she is euvolemic and feels that she does not need any additional diuresis for discharge given that she was hypotensive previously -Cardiology also not initiating SGLT2 inhibitor given her history of UTIs and Entresto spironolactone were stopped due to hypotension -Follow-up with cardiology outpatient setting and repeat chest x-ray in 3 to 6 weeks   Persistent Atrial Fibrillation with rapid ventricular rate and borderline hypotension, stable -Cardiology was consulted and given that she is overall asymptomatic but continues to have overall heart rates in the 100s to 130s.  Has been previously difficult to manage given her hypotension and has an inability to maintain normal sinus rhythm even with previous failure of cardioversion with and without amiodarone. -Currently amiodarone has been initiated and she is on IV amiodarone bolus and infusion and cardiology team has now transitioned her to oral amiodarone taper with 200 mg twice daily for 7 days and then just 200 mg daily after -Blood pressures has been on the lower side and has been hypotensive so she is on metoprolol succinate 50 mg p.o. daily which was increased to 100 mg daily. -Appears euvolemic to hypovolemic now and is on room air -Her Hydralazine has been discontinued -Continue to monitor on telemetry and patient is now on anticoagulation with apixaban as well -Continue  monitor heart rates in outpatient setting  Elevated LFTs, improving  -Likely due to hepatic congestion from CHF. -Liver Fxn Panel  Trend: Recent Labs  Lab 03/11/23 0755 03/12/23 0325 03/13/23 0326 03/14/23 0324 03/15/23 0258 03/29/23 1042 04/02/23 0529  AST 43* 46* 38 44* 53* 76* 37  ALT 29 33 33 44 58* 134* 49*  BILITOT 0.6 0.8 0.7 0.7 0.6 1.9* 0.8  ALKPHOS 246* 235* 223* 211* 196* 177* 113  -Continue to Monitor and Trend Hepatic Fxn; Repeat CMP within 1 week  Hypokalemia -Patient's K+ Level Trend: Recent Labs  Lab 03/15/23 0258 03/29/23 1042 03/30/23 0249 03/30/23 1540 03/31/23 0255 04/01/23 0248 04/02/23 0529  K 4.5 4.1 2.7* 3.6 4.0 4.0 4.1  -Continue to Monitor and Replete as Necessary -Repeat CMP in the AM   Hyponatremia -Na+ Trend: Recent Labs  Lab 03/15/23 0258 03/29/23 1042 03/30/23 0249 03/30/23 1540 03/31/23 0255 04/01/23 0248 04/02/23 0529  NA 127* 136 140 134* 135 129* 133*  -Continue to Monitor and Trend and repeat CMP in the AM  CKD Stage 3a -BUN/Cr Trend: Recent Labs  Lab 03/15/23 0258 03/29/23 1042 03/30/23 0249 03/30/23 1540 03/31/23 0255 04/01/23 0248 04/02/23 0529  BUN 35* 16 16 17 16 16 17   CREATININE 1.47* 1.35* 1.23* 1.21* 1.17* 1.10* 1.10*  -Avoid Nephrotoxic Medications, Contrast Dyes, Hypotension and Dehydration to Ensure Adequate Renal Perfusion and will need to Renally Adjust Meds -Continue to Monitor and Trend Renal Function carefully and repeat CMP within 1 week  Hypoalbuminemia -Patient's Albumin Trend: Recent Labs  Lab 03/11/23 0755 03/12/23 0325 03/13/23 0326 03/14/23 0324 03/15/23 0258 03/29/23 1042 04/02/23 0529  ALBUMIN 2.8* 2.7* 2.7* 2.8* 2.8* 4.3 3.3*  -Continue to Monitor and Trend and repeat CMP in the AM

## 2023-03-31 NOTE — Progress Notes (Addendum)
360bbb-3(b)(1), unless the authorization is terminated or revoked.     Resp Syncytial Virus by PCR NEGATIVE NEGATIVE Final    Comment: (NOTE) Fact Sheet for Patients: BloggerCourse.com  Fact Sheet for Healthcare Providers: SeriousBroker.it  This test is not yet approved or cleared by the [place] FDA and has been authorized for detection and/or diagnosis of SARS-CoV-2 by FDA under an Emergency Use Authorization (EUA). This EUA will remain in effect (meaning this test can be used) for the duration of the COVID-19 declaration under Section 564(b)(1) of the Act, 21 U.S.C. section 360bbb-3(b)(1), unless the authorization is terminated or revoked.  Performed at [facility], [address] [place], [place] [address]   Blood culture (routine x 2)     Status: None (Preliminary result)   Collection Time: 03/29/23  3:58 PM   Specimen: BLOOD  Result Value Ref Range Status   Specimen Description   Final    BLOOD BLOOD RIGHT ARM Performed at [facility], [address] [place], [place] [address]    Special Requests   Final    BOTTLES DRAWN AEROBIC AND ANAEROBIC Blood Culture adequate volume Performed at [facility], [address], [place], [place] [address]    Culture   Final    NO GROWTH 2 DAYS Performed at [facility], [address] [place], [place] [address]    Report Status PENDING  Incomplete  MRSA Next Gen by PCR, Nasal     Status: None   Collection Time: 03/29/23  6:49 PM   Specimen: Nasal Mucosa; Nasal Swab  Result Value Ref Range Status    MRSA by PCR Next Gen NOT DETECTED NOT DETECTED Final    Comment: (NOTE) The GeneXpert MRSA Assay (FDA approved for NASAL specimens only), is one component of a comprehensive MRSA colonization surveillance program. It is not intended to diagnose MRSA infection nor to guide or monitor treatment for MRSA infections. Test performance is not FDA approved in patients less than 63 years old. Performed at [facility], [address], [place], [place] [address]      Radiology Studies: ECHOCARDIOGRAM COMPLETE  Result Date: 03/30/2023    ECHOCARDIOGRAM REPORT   Patient Name:   Rhonda Marshall Date of Exam: 03/30/2023 Medical Rec #:  [id number]        Height:       61.0 in Accession #:    [id number]       Weight:       107.4 lb Date of Birth:  04-28-1951        BSA:          1.450 m Patient Age:    72 years         BP:           128/100 mmHg Patient Gender: F                HR:           67 bpm. Exam Location:  Inpatient Procedure: 2D Echo, 3D Echo, Cardiac Doppler and Color Doppler Indications:    I50.40* Unspecified combined systolic (congestive) and diastolic                 (congestive) heart failure  History:        Patient has prior history of Echocardiogram examinations, most                 recent 02/04/2023. CHF, Abnormal ECG, Arrythmias:Atrial  PROGRESS NOTE    Rhonda Marshall  WUJ:[id number] DOB: 28-Apr-1951 DOA: 03/29/2023 PCP: Knox Royalty, MD  Subjective: Pt seen and examined. Transferred to [facility] today. Has been managed by PCCM since admit. Diuresed with IV lasix for symptomatic CHF. Still in rapid afib with HR in the 1202. GDMT restarted.  In reviewing prior cardiology notes, pt has failed DCCV while taking amiodarone. Therefore amio was stopped.  Pt currently on lopressor 50 mg bid. HR still 120s.  Discussed with cardiology who will see in consult formally tomorrow.  In the meantime, will start amiodarone load/infusion to help with rate. Pt is anticoagulated with eliquis.  Pt on HR.  Remains in SDU due to rapid afib and borderline BP.   Hospital Course: HPI: 72 year old woman history of CHF EF 30% permanent atrial fibrillation recent hospitalization for community-acquired pneumonia where all evidence paced medications for heart failure except beta-blocker were stopped presents with decompensated heart failure, acute hypoxemic respiratory failure on BiPAP.   She was hospitalized.  Stopped Entresto.  Beta-blocker was continued.  Been off this now for about 2 weeks since discharge.  No alternatives were offered.  Noted her blood pressures are much higher than baseline 90 over 60s.  130s over 110s.  Consistently her last several days.  Obtained consultation via outpatient cardiology office.  No changes were made.  Progressive dyspnea over the last week.  Acutely worse over the last 24 hours or so.  Blood pressure is elevated from baseline.  Narrow pulse pressure.  Chest x-ray with significant pulmonary edema and bilateral pleural effusions enlarged compared to 03/13/2023 on my review and interpretation.  Significant Events: Admitted 03/29/2023   Significant Labs: Admitting BNP >4500. Prior DC summary state that BNP does not correlate with pt's clinical condition  Significant Imaging Studies: Admitting CXR Progressive  bibasilar atelectasis/infiltrate with small bilateral pleural effusions.  Antibiotic Therapy: Anti-infectives (From admission, onward)    Start     Dose/Rate Route Frequency Ordered Stop   03/29/23 1230  hydroxychloroquine (PLAQUENIL) tablet 200 mg        200 mg Oral Daily 03/29/23 1224     03/29/23 1200  ceFEPIme (MAXIPIME) 2 g in sodium chloride 0.9 % 100 mL IVPB        2 g 200 mL/hr over 30 Minutes Intravenous  Once 03/29/23 1145 03/29/23 1229   03/29/23 1200  vancomycin (VANCOCIN) IVPB 1000 mg/200 mL premix        1,000 mg 200 mL/hr over 60 Minutes Intravenous  Once 03/29/23 1150 03/29/23 1303   03/29/23 1145  ceFEPIme (MAXIPIME) 1 g in sodium chloride 0.9 % 100 mL IVPB  Status:  Discontinued        1 g 200 mL/hr over 30 Minutes Intravenous  Once 03/29/23 1141 03/29/23 1145       Procedures:   Consultants: Initially admitted to [facility] Cardiology    Assessment and Plan: * Acute on chronic systolic CHF (congestive heart failure) (HCC) Admitted to ICU under care of PCCM. Underwent diuresis with IV lasix. GDMT restarted but pt still in rapid afib.  Due to rapid afib and subsequent hypotension/borderline BP, will need to pare back her GDMT. Pt seems euvolemic now. On RA.  Cards to see in formal consult tomorrow.  Pt does not have pneumonia. Pneumonia has been ruled out.  Elevated LFTs Likely due to hepatic congestion from CHF. Repeat CMP in AM.  Hypokalemia Repleted with IV Kcl. resolved  Anticoagulation adequate On elquis BID.  Longstanding persistent atrial fibrillation (HCC)  360bbb-3(b)(1), unless the authorization is terminated or revoked.     Resp Syncytial Virus by PCR NEGATIVE NEGATIVE Final    Comment: (NOTE) Fact Sheet for Patients: BloggerCourse.com  Fact Sheet for Healthcare Providers: SeriousBroker.it  This test is not yet approved or cleared by the [place] FDA and has been authorized for detection and/or diagnosis of SARS-CoV-2 by FDA under an Emergency Use Authorization (EUA). This EUA will remain in effect (meaning this test can be used) for the duration of the COVID-19 declaration under Section 564(b)(1) of the Act, 21 U.S.C. section 360bbb-3(b)(1), unless the authorization is terminated or revoked.  Performed at [facility], [address] [place], [place] [address]   Blood culture (routine x 2)     Status: None (Preliminary result)   Collection Time: 03/29/23  3:58 PM   Specimen: BLOOD  Result Value Ref Range Status   Specimen Description   Final    BLOOD BLOOD RIGHT ARM Performed at [facility], [address] [place], [place] [address]    Special Requests   Final    BOTTLES DRAWN AEROBIC AND ANAEROBIC Blood Culture adequate volume Performed at [facility], [address], [place], [place] [address]    Culture   Final    NO GROWTH 2 DAYS Performed at [facility], [address] [place], [place] [address]    Report Status PENDING  Incomplete  MRSA Next Gen by PCR, Nasal     Status: None   Collection Time: 03/29/23  6:49 PM   Specimen: Nasal Mucosa; Nasal Swab  Result Value Ref Range Status    MRSA by PCR Next Gen NOT DETECTED NOT DETECTED Final    Comment: (NOTE) The GeneXpert MRSA Assay (FDA approved for NASAL specimens only), is one component of a comprehensive MRSA colonization surveillance program. It is not intended to diagnose MRSA infection nor to guide or monitor treatment for MRSA infections. Test performance is not FDA approved in patients less than 63 years old. Performed at [facility], [address], [place], [place] [address]      Radiology Studies: ECHOCARDIOGRAM COMPLETE  Result Date: 03/30/2023    ECHOCARDIOGRAM REPORT   Patient Name:   Rhonda Marshall Date of Exam: 03/30/2023 Medical Rec #:  [id number]        Height:       61.0 in Accession #:    [id number]       Weight:       107.4 lb Date of Birth:  04-28-1951        BSA:          1.450 m Patient Age:    72 years         BP:           128/100 mmHg Patient Gender: F                HR:           67 bpm. Exam Location:  Inpatient Procedure: 2D Echo, 3D Echo, Cardiac Doppler and Color Doppler Indications:    I50.40* Unspecified combined systolic (congestive) and diastolic                 (congestive) heart failure  History:        Patient has prior history of Echocardiogram examinations, most                 recent 02/04/2023. CHF, Abnormal ECG, Arrythmias:Atrial  PROGRESS NOTE    Rhonda Marshall  WUJ:[id number] DOB: 28-Apr-1951 DOA: 03/29/2023 PCP: Knox Royalty, MD  Subjective: Pt seen and examined. Transferred to [facility] today. Has been managed by PCCM since admit. Diuresed with IV lasix for symptomatic CHF. Still in rapid afib with HR in the 1202. GDMT restarted.  In reviewing prior cardiology notes, pt has failed DCCV while taking amiodarone. Therefore amio was stopped.  Pt currently on lopressor 50 mg bid. HR still 120s.  Discussed with cardiology who will see in consult formally tomorrow.  In the meantime, will start amiodarone load/infusion to help with rate. Pt is anticoagulated with eliquis.  Pt on HR.  Remains in SDU due to rapid afib and borderline BP.   Hospital Course: HPI: 72 year old woman history of CHF EF 30% permanent atrial fibrillation recent hospitalization for community-acquired pneumonia where all evidence paced medications for heart failure except beta-blocker were stopped presents with decompensated heart failure, acute hypoxemic respiratory failure on BiPAP.   She was hospitalized.  Stopped Entresto.  Beta-blocker was continued.  Been off this now for about 2 weeks since discharge.  No alternatives were offered.  Noted her blood pressures are much higher than baseline 90 over 60s.  130s over 110s.  Consistently her last several days.  Obtained consultation via outpatient cardiology office.  No changes were made.  Progressive dyspnea over the last week.  Acutely worse over the last 24 hours or so.  Blood pressure is elevated from baseline.  Narrow pulse pressure.  Chest x-ray with significant pulmonary edema and bilateral pleural effusions enlarged compared to 03/13/2023 on my review and interpretation.  Significant Events: Admitted 03/29/2023   Significant Labs: Admitting BNP >4500. Prior DC summary state that BNP does not correlate with pt's clinical condition  Significant Imaging Studies: Admitting CXR Progressive  bibasilar atelectasis/infiltrate with small bilateral pleural effusions.  Antibiotic Therapy: Anti-infectives (From admission, onward)    Start     Dose/Rate Route Frequency Ordered Stop   03/29/23 1230  hydroxychloroquine (PLAQUENIL) tablet 200 mg        200 mg Oral Daily 03/29/23 1224     03/29/23 1200  ceFEPIme (MAXIPIME) 2 g in sodium chloride 0.9 % 100 mL IVPB        2 g 200 mL/hr over 30 Minutes Intravenous  Once 03/29/23 1145 03/29/23 1229   03/29/23 1200  vancomycin (VANCOCIN) IVPB 1000 mg/200 mL premix        1,000 mg 200 mL/hr over 60 Minutes Intravenous  Once 03/29/23 1150 03/29/23 1303   03/29/23 1145  ceFEPIme (MAXIPIME) 1 g in sodium chloride 0.9 % 100 mL IVPB  Status:  Discontinued        1 g 200 mL/hr over 30 Minutes Intravenous  Once 03/29/23 1141 03/29/23 1145       Procedures:   Consultants: Initially admitted to [facility] Cardiology    Assessment and Plan: * Acute on chronic systolic CHF (congestive heart failure) (HCC) Admitted to ICU under care of PCCM. Underwent diuresis with IV lasix. GDMT restarted but pt still in rapid afib.  Due to rapid afib and subsequent hypotension/borderline BP, will need to pare back her GDMT. Pt seems euvolemic now. On RA.  Cards to see in formal consult tomorrow.  Pt does not have pneumonia. Pneumonia has been ruled out.  Elevated LFTs Likely due to hepatic congestion from CHF. Repeat CMP in AM.  Hypokalemia Repleted with IV Kcl. resolved  Anticoagulation adequate On elquis BID.  Longstanding persistent atrial fibrillation (HCC)  360bbb-3(b)(1), unless the authorization is terminated or revoked.     Resp Syncytial Virus by PCR NEGATIVE NEGATIVE Final    Comment: (NOTE) Fact Sheet for Patients: BloggerCourse.com  Fact Sheet for Healthcare Providers: SeriousBroker.it  This test is not yet approved or cleared by the [place] FDA and has been authorized for detection and/or diagnosis of SARS-CoV-2 by FDA under an Emergency Use Authorization (EUA). This EUA will remain in effect (meaning this test can be used) for the duration of the COVID-19 declaration under Section 564(b)(1) of the Act, 21 U.S.C. section 360bbb-3(b)(1), unless the authorization is terminated or revoked.  Performed at [facility], [address] [place], [place] [address]   Blood culture (routine x 2)     Status: None (Preliminary result)   Collection Time: 03/29/23  3:58 PM   Specimen: BLOOD  Result Value Ref Range Status   Specimen Description   Final    BLOOD BLOOD RIGHT ARM Performed at [facility], [address] [place], [place] [address]    Special Requests   Final    BOTTLES DRAWN AEROBIC AND ANAEROBIC Blood Culture adequate volume Performed at [facility], [address], [place], [place] [address]    Culture   Final    NO GROWTH 2 DAYS Performed at [facility], [address] [place], [place] [address]    Report Status PENDING  Incomplete  MRSA Next Gen by PCR, Nasal     Status: None   Collection Time: 03/29/23  6:49 PM   Specimen: Nasal Mucosa; Nasal Swab  Result Value Ref Range Status    MRSA by PCR Next Gen NOT DETECTED NOT DETECTED Final    Comment: (NOTE) The GeneXpert MRSA Assay (FDA approved for NASAL specimens only), is one component of a comprehensive MRSA colonization surveillance program. It is not intended to diagnose MRSA infection nor to guide or monitor treatment for MRSA infections. Test performance is not FDA approved in patients less than 63 years old. Performed at [facility], [address], [place], [place] [address]      Radiology Studies: ECHOCARDIOGRAM COMPLETE  Result Date: 03/30/2023    ECHOCARDIOGRAM REPORT   Patient Name:   Rhonda Marshall Date of Exam: 03/30/2023 Medical Rec #:  [id number]        Height:       61.0 in Accession #:    [id number]       Weight:       107.4 lb Date of Birth:  04-28-1951        BSA:          1.450 m Patient Age:    72 years         BP:           128/100 mmHg Patient Gender: F                HR:           67 bpm. Exam Location:  Inpatient Procedure: 2D Echo, 3D Echo, Cardiac Doppler and Color Doppler Indications:    I50.40* Unspecified combined systolic (congestive) and diastolic                 (congestive) heart failure  History:        Patient has prior history of Echocardiogram examinations, most                 recent 02/04/2023. CHF, Abnormal ECG, Arrythmias:Atrial  PROGRESS NOTE    Rhonda Marshall  WUJ:[id number] DOB: 28-Apr-1951 DOA: 03/29/2023 PCP: Knox Royalty, MD  Subjective: Pt seen and examined. Transferred to [facility] today. Has been managed by PCCM since admit. Diuresed with IV lasix for symptomatic CHF. Still in rapid afib with HR in the 1202. GDMT restarted.  In reviewing prior cardiology notes, pt has failed DCCV while taking amiodarone. Therefore amio was stopped.  Pt currently on lopressor 50 mg bid. HR still 120s.  Discussed with cardiology who will see in consult formally tomorrow.  In the meantime, will start amiodarone load/infusion to help with rate. Pt is anticoagulated with eliquis.  Pt on HR.  Remains in SDU due to rapid afib and borderline BP.   Hospital Course: HPI: 72 year old woman history of CHF EF 30% permanent atrial fibrillation recent hospitalization for community-acquired pneumonia where all evidence paced medications for heart failure except beta-blocker were stopped presents with decompensated heart failure, acute hypoxemic respiratory failure on BiPAP.   She was hospitalized.  Stopped Entresto.  Beta-blocker was continued.  Been off this now for about 2 weeks since discharge.  No alternatives were offered.  Noted her blood pressures are much higher than baseline 90 over 60s.  130s over 110s.  Consistently her last several days.  Obtained consultation via outpatient cardiology office.  No changes were made.  Progressive dyspnea over the last week.  Acutely worse over the last 24 hours or so.  Blood pressure is elevated from baseline.  Narrow pulse pressure.  Chest x-ray with significant pulmonary edema and bilateral pleural effusions enlarged compared to 03/13/2023 on my review and interpretation.  Significant Events: Admitted 03/29/2023   Significant Labs: Admitting BNP >4500. Prior DC summary state that BNP does not correlate with pt's clinical condition  Significant Imaging Studies: Admitting CXR Progressive  bibasilar atelectasis/infiltrate with small bilateral pleural effusions.  Antibiotic Therapy: Anti-infectives (From admission, onward)    Start     Dose/Rate Route Frequency Ordered Stop   03/29/23 1230  hydroxychloroquine (PLAQUENIL) tablet 200 mg        200 mg Oral Daily 03/29/23 1224     03/29/23 1200  ceFEPIme (MAXIPIME) 2 g in sodium chloride 0.9 % 100 mL IVPB        2 g 200 mL/hr over 30 Minutes Intravenous  Once 03/29/23 1145 03/29/23 1229   03/29/23 1200  vancomycin (VANCOCIN) IVPB 1000 mg/200 mL premix        1,000 mg 200 mL/hr over 60 Minutes Intravenous  Once 03/29/23 1150 03/29/23 1303   03/29/23 1145  ceFEPIme (MAXIPIME) 1 g in sodium chloride 0.9 % 100 mL IVPB  Status:  Discontinued        1 g 200 mL/hr over 30 Minutes Intravenous  Once 03/29/23 1141 03/29/23 1145       Procedures:   Consultants: Initially admitted to [facility] Cardiology    Assessment and Plan: * Acute on chronic systolic CHF (congestive heart failure) (HCC) Admitted to ICU under care of PCCM. Underwent diuresis with IV lasix. GDMT restarted but pt still in rapid afib.  Due to rapid afib and subsequent hypotension/borderline BP, will need to pare back her GDMT. Pt seems euvolemic now. On RA.  Cards to see in formal consult tomorrow.  Pt does not have pneumonia. Pneumonia has been ruled out.  Elevated LFTs Likely due to hepatic congestion from CHF. Repeat CMP in AM.  Hypokalemia Repleted with IV Kcl. resolved  Anticoagulation adequate On elquis BID.  Longstanding persistent atrial fibrillation (HCC)

## 2023-03-31 NOTE — Subjective & Objective (Signed)
Pt seen and examined. Transferred to [facility] today. Has been managed by PCCM since admit. Diuresed with IV lasix for symptomatic CHF. Still in rapid afib with HR in the 1202. GDMT restarted.  In reviewing prior cardiology notes, pt has failed DCCV while taking amiodarone. Therefore amio was stopped.  Pt currently on lopressor 50 mg bid. HR still 120s.  Discussed with cardiology who will see in consult formally tomorrow.  In the meantime, will start amiodarone load/infusion to help with rate. Pt is anticoagulated with eliquis.  Pt on HR.  Remains in SDU due to rapid afib and borderline BP.

## 2023-03-31 NOTE — Progress Notes (Signed)
   03/31/23 0330  BiPAP/CPAP/SIPAP  BiPAP/CPAP/SIPAP Pt Type Adult  BiPAP/CPAP/SIPAP V60  Mask Type Full face mask  Mask Size Medium  Set Rate 10 breaths/min  Respiratory Rate 16 breaths/min  IPAP 12 cmH20  EPAP 5 cmH2O  FiO2 (%) 30 %  Flow Rate 0 lpm  Minute Ventilation 5.1  Leak 15  Peak Inspiratory Pressure (PIP) 12  Tidal Volume (Vt) 318  Patient Home Equipment No  Auto Titrate No  Press High Alarm 30 cmH2O  Press Low Alarm 5 cmH2O

## 2023-03-31 NOTE — Assessment & Plan Note (Signed)
On elquis BID.

## 2023-03-31 NOTE — Assessment & Plan Note (Addendum)
Admitted to ICU under care of PCCM. Underwent diuresis with IV lasix. GDMT restarted but pt still in rapid afib.  Due to rapid afib and subsequent hypotension/borderline BP, will need to pare back her GDMT. Pt seems euvolemic now. On RA.  Cards to see in formal consult tomorrow.  Pt does not have pneumonia. Pneumonia has been ruled out.

## 2023-03-31 NOTE — Plan of Care (Signed)
Problem: Education: Goal: Knowledge of General Education information will improve Description: Including pain rating scale, medication(s)/side effects and non-pharmacologic comfort measures Outcome: Progressing   Problem: Clinical Measurements: Goal: Will remain free from infection Outcome: Progressing Goal: Diagnostic test results will improve Outcome: Progressing Goal: Respiratory complications will improve Outcome: Progressing Goal: Cardiovascular complication will be avoided Outcome: Progressing

## 2023-03-31 NOTE — Assessment & Plan Note (Signed)
Likely due to hepatic congestion from CHF. Repeat CMP in AM.

## 2023-04-01 DIAGNOSIS — R7989 Other specified abnormal findings of blood chemistry: Secondary | ICD-10-CM | POA: Diagnosis not present

## 2023-04-01 DIAGNOSIS — I5023 Acute on chronic systolic (congestive) heart failure: Secondary | ICD-10-CM | POA: Diagnosis not present

## 2023-04-01 DIAGNOSIS — I4891 Unspecified atrial fibrillation: Secondary | ICD-10-CM

## 2023-04-01 DIAGNOSIS — E876 Hypokalemia: Secondary | ICD-10-CM | POA: Diagnosis not present

## 2023-04-01 LAB — CBC WITH DIFFERENTIAL/PLATELET
Abs Immature Granulocytes: 0.02 10*3/uL (ref 0.00–0.07)
Basophils Absolute: 0.1 10*3/uL (ref 0.0–0.1)
Basophils Relative: 1 %
Eosinophils Absolute: 0.4 10*3/uL (ref 0.0–0.5)
Eosinophils Relative: 5 %
HCT: 43.8 % (ref 36.0–46.0)
Hemoglobin: 14.1 g/dL (ref 12.0–15.0)
Immature Granulocytes: 0 %
Lymphocytes Relative: 19 %
Lymphs Abs: 1.3 10*3/uL (ref 0.7–4.0)
MCH: 31.3 pg (ref 26.0–34.0)
MCHC: 32.2 g/dL (ref 30.0–36.0)
MCV: 97.1 fL (ref 80.0–100.0)
Monocytes Absolute: 0.8 10*3/uL (ref 0.1–1.0)
Monocytes Relative: 12 %
Neutro Abs: 4.5 10*3/uL (ref 1.7–7.7)
Neutrophils Relative %: 63 %
Platelets: 167 10*3/uL (ref 150–400)
RBC: 4.51 MIL/uL (ref 3.87–5.11)
RDW: 16.9 % — ABNORMAL HIGH (ref 11.5–15.5)
WBC: 7.1 10*3/uL (ref 4.0–10.5)
nRBC: 0 % (ref 0.0–0.2)

## 2023-04-01 LAB — GLUCOSE, CAPILLARY
Glucose-Capillary: 102 mg/dL — ABNORMAL HIGH (ref 70–99)
Glucose-Capillary: 108 mg/dL — ABNORMAL HIGH (ref 70–99)
Glucose-Capillary: 131 mg/dL — ABNORMAL HIGH (ref 70–99)
Glucose-Capillary: 139 mg/dL — ABNORMAL HIGH (ref 70–99)
Glucose-Capillary: 174 mg/dL — ABNORMAL HIGH (ref 70–99)
Glucose-Capillary: 90 mg/dL (ref 70–99)

## 2023-04-01 LAB — PHOSPHORUS: Phosphorus: 3.4 mg/dL (ref 2.5–4.6)

## 2023-04-01 MED ORDER — ORAL CARE MOUTH RINSE: 15.0000 mL | OROMUCOSAL | Status: DC | PRN

## 2023-04-01 MED ORDER — ORAL CARE MOUTH RINSE
15.0000 mL | OROMUCOSAL | Status: DC
  Administered 2023-04-01 – 2023-04-02 (×3): 15 mL via OROMUCOSAL

## 2023-04-01 MED ORDER — ORAL CARE MOUTH RINSE
15.0000 mL | OROMUCOSAL | Status: DC
  Administered 2023-04-01: 15 mL via OROMUCOSAL

## 2023-04-01 NOTE — Consult Note (Signed)
43.8  MCV 101.9* 95.7 97.1  MCH 32.0 31.7 31.3   MCHC 31.4 33.1 32.2  RDW 16.9* 16.4* 16.9*  PLT 205 177 167   Thyroid  Recent Labs  Lab 03/29/23 1042  TSH 2.047  FREET4 1.00    BNP Recent Labs  Lab 03/29/23 1042  BNP >4,500.0*    DDimer No results for input(s): "DDIMER" in the last 168 hours.   Radiology/Studies:  ECHOCARDIOGRAM COMPLETE  Result Date: 03/30/2023    ECHOCARDIOGRAM REPORT   Patient Name:   Rhonda Marshall Date of Exam: 03/30/2023 Medical Rec #:  [id number]        Height:       61.0 in Accession #:    [id number]       Weight:       107.4 lb Date of Birth:  1950/07/23        BSA:          1.450 m Patient Age:    72 years         BP:           128/100 mmHg Patient Gender: F                HR:           67 bpm. Exam Location:  Inpatient Procedure: 2D Echo, 3D Echo, Cardiac Doppler and Color Doppler Indications:    I50.40* Unspecified combined systolic (congestive) and diastolic                 (congestive) heart failure  History:        Patient has prior history of Echocardiogram examinations, most                 recent 02/04/2023. CHF, Abnormal ECG, Arrythmias:Atrial                 Fibrillation and LBBB, Signs/Symptoms:Chest Pain, Dyspnea and                 Shortness of Breath; Risk Factors:Current Smoker. Moderate                 mitral regurgitation.  Sonographer:    Rhonda Marshall RDCS Referring Phys: [phone] Rhonda Marshall IMPRESSIONS  1. Left ventricular ejection fraction, by estimation, is 30 to 35%. The left ventricle has moderately decreased function. The left ventricle demonstrates global hypokinesis. Left ventricular diastolic parameters are indeterminate.  2. Right ventricular systolic function is normal. The right ventricular size is normal.  3. Left atrial size was severely dilated.  4. Right atrial size was moderately dilated.  5. Moderate pleural effusion in the left lateral region.  6. The mitral valve is degenerative. Mild mitral valve regurgitation. No evidence of mitral stenosis.  7. The aortic valve is  tricuspid. Aortic valve regurgitation is not visualized. Aortic valve sclerosis is present, with no evidence of aortic valve stenosis. FINDINGS  Left Ventricle: Left ventricular ejection fraction, by estimation, is 30 to 35%. The left ventricle has moderately decreased function. The left ventricle demonstrates global hypokinesis. The left ventricular internal cavity size was normal in size. There is no left ventricular hypertrophy. Abnormal (paradoxical) septal motion, consistent with left bundle branch block. Left ventricular diastolic parameters are indeterminate. Right Ventricle: The right ventricular size is normal. Right ventricular systolic function is normal. Left Atrium: Left atrial size was severely dilated. Right Atrium: Right atrial size was moderately dilated. Pericardium: There is no evidence of pericardial effusion. Mitral Valve: The mitral valve is  Cardiology Consultation   Patient ID: Rhonda Marshall MRN: [id number]; DOB: 1950/11/12  Admit date: 03/29/2023 Date of Consult: 04/01/2023  PCP:  Rhonda Royalty, MD   [facility] Providers Cardiologist:  Rhonda Rotunda, MD   {  Patient Profile:   Rhonda Marshall is a 72 y.o. female with a hx of longstanding persistent atrial fibrillation, left bundle branch block, hypertension, chronic HFrEF, RA who is being seen 04/01/2023 for the evaluation of A-fib RVR at the request of Rhonda Marshall.  History of Present Illness:   Rhonda Marshall has history of atrial fibrillation dating back in 2016 when she had a cardioversion.  Eventually she underwent DCCV on amiodarone but had return back to atrial fibrillation.  Eventually amiodarone was stopped altogether as there were low suspicion she would maintain normal sinus rhythm.  Additionally, she has history of chronic HFrEF thought to be nonischemic due to reported normal Lexi view in 2018.  EF has generally been around 30 to 35% over the years.  Medications have been difficult to titrate due to underlying hypotension and recent admission for pneumonia last month.  Much of her GDMT has been withheld.  Plans were to assess her at tomorrow's outpatient visit.  Recently last month, patient also has hospitalization for community-acquired pneumonia. Much of her GDMT except for her beta-blocker were withheld due to uti and hypotension.  She had presented with decompensated heart failure and acute respiratory failure requiring BiPAP.  Currently patient is being evaluated for another heart failure exacerbation with symptoms of shortness of breath and fatigue that started on Sunday.  BNP >4500. She was admitted under critical care services with evidence of pulmonary congestion and bilateral pleural effusions and hypotension.  She was started on IV diuretics and now comfortable and euvolemic.  She did require BiPAP however has been weaned off and back to room  air.  Cardiology asked to consult due to elevated heart rates in the 120s soft and narrow pulse pressure.  She has been started on IV amiodarone.  Patient evaluated at the bedside in somewhat confused, not sure if this is her baseline.  She often does not answer questions appropriately in has tangential thoughts.  However, she denies any cardiac complaints and denies any shortness of breath or chest pain or swelling.  She does having breakfast and resting comfortably.  She does not notice her atrial fibrillation much.  She feels much better after the IV diuretics.  Past Medical History:  Diagnosis Date   Anxiety    Atrial fibrillation (HCC)    newly diagnosed 10/2010   Family history of adverse reaction to anesthesia    Sister's BP drops    Hip fracture (HCC) 01/18/2022   Hypertension    Rheumatoid arthritis(714.0)    Subclinical hyperthyroidism     Past Surgical History:  Procedure Laterality Date   Arm surgery     fracture, right arm   CARDIOVERSION N/A 08/10/2014   Procedure: CARDIOVERSION;  Surgeon: Rhonda Masson, MD;  Location:  [facility] ENDOSCOPY;  Service: Cardiovascular;  Laterality: N/A;   CARDIOVERSION N/A 01/22/2022   Procedure: CARDIOVERSION;  Surgeon: Meriam Sprague, MD;  Location: [facility] ENDOSCOPY;  Service: Cardiovascular;  Laterality: N/A;   CATARACT EXTRACTION     DIRECT LARYNGOSCOPY N/A 09/02/2019   Procedure: DIRECT LARYNGOSCOPY WITH BIOPSY;  Surgeon: Drema Halon, MD;  Location: [facility];  Service: ENT;  Laterality: N/A;   HIP ARTHROPLASTY Right 01/19/2022   Procedure: ARTHROPLASTY BIPOLAR HIP (HEMIARTHROPLASTY);  Surgeon: Dion Saucier,  Cardiology Consultation   Patient ID: Rhonda Marshall MRN: [id number]; DOB: 1950/11/12  Admit date: 03/29/2023 Date of Consult: 04/01/2023  PCP:  Rhonda Royalty, MD   [facility] Providers Cardiologist:  Rhonda Rotunda, MD   {  Patient Profile:   Rhonda Marshall is a 72 y.o. female with a hx of longstanding persistent atrial fibrillation, left bundle branch block, hypertension, chronic HFrEF, RA who is being seen 04/01/2023 for the evaluation of A-fib RVR at the request of Rhonda Marshall.  History of Present Illness:   Rhonda Marshall has history of atrial fibrillation dating back in 2016 when she had a cardioversion.  Eventually she underwent DCCV on amiodarone but had return back to atrial fibrillation.  Eventually amiodarone was stopped altogether as there were low suspicion she would maintain normal sinus rhythm.  Additionally, she has history of chronic HFrEF thought to be nonischemic due to reported normal Lexi view in 2018.  EF has generally been around 30 to 35% over the years.  Medications have been difficult to titrate due to underlying hypotension and recent admission for pneumonia last month.  Much of her GDMT has been withheld.  Plans were to assess her at tomorrow's outpatient visit.  Recently last month, patient also has hospitalization for community-acquired pneumonia. Much of her GDMT except for her beta-blocker were withheld due to uti and hypotension.  She had presented with decompensated heart failure and acute respiratory failure requiring BiPAP.  Currently patient is being evaluated for another heart failure exacerbation with symptoms of shortness of breath and fatigue that started on Sunday.  BNP >4500. She was admitted under critical care services with evidence of pulmonary congestion and bilateral pleural effusions and hypotension.  She was started on IV diuretics and now comfortable and euvolemic.  She did require BiPAP however has been weaned off and back to room  air.  Cardiology asked to consult due to elevated heart rates in the 120s soft and narrow pulse pressure.  She has been started on IV amiodarone.  Patient evaluated at the bedside in somewhat confused, not sure if this is her baseline.  She often does not answer questions appropriately in has tangential thoughts.  However, she denies any cardiac complaints and denies any shortness of breath or chest pain or swelling.  She does having breakfast and resting comfortably.  She does not notice her atrial fibrillation much.  She feels much better after the IV diuretics.  Past Medical History:  Diagnosis Date   Anxiety    Atrial fibrillation (HCC)    newly diagnosed 10/2010   Family history of adverse reaction to anesthesia    Sister's BP drops    Hip fracture (HCC) 01/18/2022   Hypertension    Rheumatoid arthritis(714.0)    Subclinical hyperthyroidism     Past Surgical History:  Procedure Laterality Date   Arm surgery     fracture, right arm   CARDIOVERSION N/A 08/10/2014   Procedure: CARDIOVERSION;  Surgeon: Rhonda Masson, MD;  Location:  [facility] ENDOSCOPY;  Service: Cardiovascular;  Laterality: N/A;   CARDIOVERSION N/A 01/22/2022   Procedure: CARDIOVERSION;  Surgeon: Meriam Sprague, MD;  Location: [facility] ENDOSCOPY;  Service: Cardiovascular;  Laterality: N/A;   CATARACT EXTRACTION     DIRECT LARYNGOSCOPY N/A 09/02/2019   Procedure: DIRECT LARYNGOSCOPY WITH BIOPSY;  Surgeon: Drema Halon, MD;  Location: [facility];  Service: ENT;  Laterality: N/A;   HIP ARTHROPLASTY Right 01/19/2022   Procedure: ARTHROPLASTY BIPOLAR HIP (HEMIARTHROPLASTY);  Surgeon: Dion Saucier,  Cardiology Consultation   Patient ID: Rhonda Marshall MRN: [id number]; DOB: 1950/11/12  Admit date: 03/29/2023 Date of Consult: 04/01/2023  PCP:  Rhonda Royalty, MD   [facility] Providers Cardiologist:  Rhonda Rotunda, MD   {  Patient Profile:   Rhonda Marshall is a 72 y.o. female with a hx of longstanding persistent atrial fibrillation, left bundle branch block, hypertension, chronic HFrEF, RA who is being seen 04/01/2023 for the evaluation of A-fib RVR at the request of Rhonda Marshall.  History of Present Illness:   Rhonda Marshall has history of atrial fibrillation dating back in 2016 when she had a cardioversion.  Eventually she underwent DCCV on amiodarone but had return back to atrial fibrillation.  Eventually amiodarone was stopped altogether as there were low suspicion she would maintain normal sinus rhythm.  Additionally, she has history of chronic HFrEF thought to be nonischemic due to reported normal Lexi view in 2018.  EF has generally been around 30 to 35% over the years.  Medications have been difficult to titrate due to underlying hypotension and recent admission for pneumonia last month.  Much of her GDMT has been withheld.  Plans were to assess her at tomorrow's outpatient visit.  Recently last month, patient also has hospitalization for community-acquired pneumonia. Much of her GDMT except for her beta-blocker were withheld due to uti and hypotension.  She had presented with decompensated heart failure and acute respiratory failure requiring BiPAP.  Currently patient is being evaluated for another heart failure exacerbation with symptoms of shortness of breath and fatigue that started on Sunday.  BNP >4500. She was admitted under critical care services with evidence of pulmonary congestion and bilateral pleural effusions and hypotension.  She was started on IV diuretics and now comfortable and euvolemic.  She did require BiPAP however has been weaned off and back to room  air.  Cardiology asked to consult due to elevated heart rates in the 120s soft and narrow pulse pressure.  She has been started on IV amiodarone.  Patient evaluated at the bedside in somewhat confused, not sure if this is her baseline.  She often does not answer questions appropriately in has tangential thoughts.  However, she denies any cardiac complaints and denies any shortness of breath or chest pain or swelling.  She does having breakfast and resting comfortably.  She does not notice her atrial fibrillation much.  She feels much better after the IV diuretics.  Past Medical History:  Diagnosis Date   Anxiety    Atrial fibrillation (HCC)    newly diagnosed 10/2010   Family history of adverse reaction to anesthesia    Sister's BP drops    Hip fracture (HCC) 01/18/2022   Hypertension    Rheumatoid arthritis(714.0)    Subclinical hyperthyroidism     Past Surgical History:  Procedure Laterality Date   Arm surgery     fracture, right arm   CARDIOVERSION N/A 08/10/2014   Procedure: CARDIOVERSION;  Surgeon: Rhonda Masson, MD;  Location:  [facility] ENDOSCOPY;  Service: Cardiovascular;  Laterality: N/A;   CARDIOVERSION N/A 01/22/2022   Procedure: CARDIOVERSION;  Surgeon: Meriam Sprague, MD;  Location: [facility] ENDOSCOPY;  Service: Cardiovascular;  Laterality: N/A;   CATARACT EXTRACTION     DIRECT LARYNGOSCOPY N/A 09/02/2019   Procedure: DIRECT LARYNGOSCOPY WITH BIOPSY;  Surgeon: Drema Halon, MD;  Location: [facility];  Service: ENT;  Laterality: N/A;   HIP ARTHROPLASTY Right 01/19/2022   Procedure: ARTHROPLASTY BIPOLAR HIP (HEMIARTHROPLASTY);  Surgeon: Dion Saucier,  43.8  MCV 101.9* 95.7 97.1  MCH 32.0 31.7 31.3   MCHC 31.4 33.1 32.2  RDW 16.9* 16.4* 16.9*  PLT 205 177 167   Thyroid  Recent Labs  Lab 03/29/23 1042  TSH 2.047  FREET4 1.00    BNP Recent Labs  Lab 03/29/23 1042  BNP >4,500.0*    DDimer No results for input(s): "DDIMER" in the last 168 hours.   Radiology/Studies:  ECHOCARDIOGRAM COMPLETE  Result Date: 03/30/2023    ECHOCARDIOGRAM REPORT   Patient Name:   Rhonda Marshall Date of Exam: 03/30/2023 Medical Rec #:  [id number]        Height:       61.0 in Accession #:    [id number]       Weight:       107.4 lb Date of Birth:  1950/07/23        BSA:          1.450 m Patient Age:    72 years         BP:           128/100 mmHg Patient Gender: F                HR:           67 bpm. Exam Location:  Inpatient Procedure: 2D Echo, 3D Echo, Cardiac Doppler and Color Doppler Indications:    I50.40* Unspecified combined systolic (congestive) and diastolic                 (congestive) heart failure  History:        Patient has prior history of Echocardiogram examinations, most                 recent 02/04/2023. CHF, Abnormal ECG, Arrythmias:Atrial                 Fibrillation and LBBB, Signs/Symptoms:Chest Pain, Dyspnea and                 Shortness of Breath; Risk Factors:Current Smoker. Moderate                 mitral regurgitation.  Sonographer:    Rhonda Marshall RDCS Referring Phys: [phone] Rhonda Marshall IMPRESSIONS  1. Left ventricular ejection fraction, by estimation, is 30 to 35%. The left ventricle has moderately decreased function. The left ventricle demonstrates global hypokinesis. Left ventricular diastolic parameters are indeterminate.  2. Right ventricular systolic function is normal. The right ventricular size is normal.  3. Left atrial size was severely dilated.  4. Right atrial size was moderately dilated.  5. Moderate pleural effusion in the left lateral region.  6. The mitral valve is degenerative. Mild mitral valve regurgitation. No evidence of mitral stenosis.  7. The aortic valve is  tricuspid. Aortic valve regurgitation is not visualized. Aortic valve sclerosis is present, with no evidence of aortic valve stenosis. FINDINGS  Left Ventricle: Left ventricular ejection fraction, by estimation, is 30 to 35%. The left ventricle has moderately decreased function. The left ventricle demonstrates global hypokinesis. The left ventricular internal cavity size was normal in size. There is no left ventricular hypertrophy. Abnormal (paradoxical) septal motion, consistent with left bundle branch block. Left ventricular diastolic parameters are indeterminate. Right Ventricle: The right ventricular size is normal. Right ventricular systolic function is normal. Left Atrium: Left atrial size was severely dilated. Right Atrium: Right atrial size was moderately dilated. Pericardium: There is no evidence of pericardial effusion. Mitral Valve: The mitral valve is  43.8  MCV 101.9* 95.7 97.1  MCH 32.0 31.7 31.3   MCHC 31.4 33.1 32.2  RDW 16.9* 16.4* 16.9*  PLT 205 177 167   Thyroid  Recent Labs  Lab 03/29/23 1042  TSH 2.047  FREET4 1.00    BNP Recent Labs  Lab 03/29/23 1042  BNP >4,500.0*    DDimer No results for input(s): "DDIMER" in the last 168 hours.   Radiology/Studies:  ECHOCARDIOGRAM COMPLETE  Result Date: 03/30/2023    ECHOCARDIOGRAM REPORT   Patient Name:   Rhonda Marshall Date of Exam: 03/30/2023 Medical Rec #:  [id number]        Height:       61.0 in Accession #:    [id number]       Weight:       107.4 lb Date of Birth:  1950/07/23        BSA:          1.450 m Patient Age:    72 years         BP:           128/100 mmHg Patient Gender: F                HR:           67 bpm. Exam Location:  Inpatient Procedure: 2D Echo, 3D Echo, Cardiac Doppler and Color Doppler Indications:    I50.40* Unspecified combined systolic (congestive) and diastolic                 (congestive) heart failure  History:        Patient has prior history of Echocardiogram examinations, most                 recent 02/04/2023. CHF, Abnormal ECG, Arrythmias:Atrial                 Fibrillation and LBBB, Signs/Symptoms:Chest Pain, Dyspnea and                 Shortness of Breath; Risk Factors:Current Smoker. Moderate                 mitral regurgitation.  Sonographer:    Rhonda Marshall RDCS Referring Phys: [phone] Rhonda Marshall IMPRESSIONS  1. Left ventricular ejection fraction, by estimation, is 30 to 35%. The left ventricle has moderately decreased function. The left ventricle demonstrates global hypokinesis. Left ventricular diastolic parameters are indeterminate.  2. Right ventricular systolic function is normal. The right ventricular size is normal.  3. Left atrial size was severely dilated.  4. Right atrial size was moderately dilated.  5. Moderate pleural effusion in the left lateral region.  6. The mitral valve is degenerative. Mild mitral valve regurgitation. No evidence of mitral stenosis.  7. The aortic valve is  tricuspid. Aortic valve regurgitation is not visualized. Aortic valve sclerosis is present, with no evidence of aortic valve stenosis. FINDINGS  Left Ventricle: Left ventricular ejection fraction, by estimation, is 30 to 35%. The left ventricle has moderately decreased function. The left ventricle demonstrates global hypokinesis. The left ventricular internal cavity size was normal in size. There is no left ventricular hypertrophy. Abnormal (paradoxical) septal motion, consistent with left bundle branch block. Left ventricular diastolic parameters are indeterminate. Right Ventricle: The right ventricular size is normal. Right ventricular systolic function is normal. Left Atrium: Left atrial size was severely dilated. Right Atrium: Right atrial size was moderately dilated. Pericardium: There is no evidence of pericardial effusion. Mitral Valve: The mitral valve is

## 2023-04-01 NOTE — Progress Notes (Signed)
PROGRESS NOTE    Rhonda Marshall  NFA:[id number] DOB: [birth year] DOA: 03/29/2023 PCP: Knox Royalty, MD   Brief Narrative:  The patient  is a 72 year old woman history of CHF EF 30% permanent atrial fibrillation recent hospitalization for community-acquired pneumonia where all evidence paced medications for heart failure except beta-blocker were stopped presents with decompensated heart failure, acute hypoxemic respiratory failure on BiPAP.  She was hospitalized and her Sherryll Burger was stopped and no alternatives were offered.  She began to have progressive dyspnea last week and was worse 24 hours prior to admission.  Chest x-ray was done and showed significant pulmonary edema and bilateral pleural effusions compared to 03/13/2023.  She was admitted to the PCCM service and transferred to the hospitalist service on 03/31/2023.  Echo was done and showed EF of 30 to 35%.  She was diuresed and now being placed on IV amiodarone drip given her persistent A-fib with RVR.  Diuresis has been held today.  Cardiology is considering transition to oral amiodarone tomorrow.  She is improved and transferred out of the stepdown unit to the telemetry floor.   Interim History:  Acute on Chronic Systolic CHF (congestive heart failure) (HCC) -Admitted to ICU under care of PCCM. Underwent diuresis with IV lasix and responded quite well and quickly to the diuresis -GDMT is limited given the she was taken off of empagliflozin due to urinary tract infection history and is not able to tolerate Entresto due to hypotension -Echo was done and showed an EF of 30 to 35% with left ventricule demonstrating global hypokinesis with the left ventricular diastolic parameters being indeterminate -Strict I's and O's and daily weights -Continues remain in atrial fibrillation but has better rate control on the IV amiodarone and Toprol -Unable to tolerate Entresto due to hypotension -Cardiology recommends continuing amiodarone IV today and  switching her to oral amiodarone tomorrow possibly and considering titrating up her beta-blocker -Cardiology still does not have a clear etiology of why her left ventricular ejection fraction has declined but may be related to uncontrolled atrial fibrillation   Persistent Atrial Fibrillation with rapid ventricular rate and borderline hypotension -Cardiology was consulted and given that she is overall asymptomatic but continues to have overall heart rates in the 100s to 130s.  Has been previously difficult to manage given her hypotension and has an inability to maintain normal sinus rhythm even with previous failure of cardioversion with and without amiodarone. -Currently amiodarone has been initiated and she is on IV amiodarone bolus and infusion and cardiology team is continuing today. -Blood pressures has been on the lower side and has been hypotensive so she is on metoprolol succinate 50 mg p.o. daily. -Appears euvolemic to hypovolemic now and is on room air -Cardiology recommends continuing IV amiodarone loading today and consider switching to oral amiodarone tomorrow and possibly uptitrating her beta-blocker -Her Hydralazine has been discontinued -Continue to monitor on telemetry and patient is now on anticoagulation with apixaban as well  Elevated LFTs -Likely due to hepatic congestion from CHF. -Liver Fxn Panel Trend: Recent Labs  Lab 03/10/23 1330 03/11/23 0755 03/12/23 0325 03/13/23 0326 03/14/23 0324 03/15/23 0258 03/29/23 1042  AST 26 43* 46* 38 44* 53* 76*  ALT 17 29 33 33 44 58* 134*  BILITOT 0.6 0.6 0.8 0.7 0.7 0.6 1.9*  ALKPHOS 238* 246* 235* 223* 211* 196* 177*  -Continue to Monitor and Trend Hepatic Fxn; Repeat CMP in the AM    Hypokalemia -Patient's K+ Level Trend: Recent Labs  Lab 03/14/23 0324  03/15/23 0258 03/29/23 1042 03/30/23 0249 03/30/23 1540 03/31/23 0255 04/01/23 0248  K 5.2* 4.5 4.1 2.7* 3.6 4.0 4.0  -Continue to Monitor and Replete as  Necessary -Repeat CMP in the AM   Hyponatremia -Na+ Trend: Recent Labs  Lab 03/14/23 0324 03/15/23 0258 03/29/23 1042 03/30/23 0249 03/30/23 1540 03/31/23 0255 04/01/23 0248  NA 131* 127* 136 140 134* 135 129*  -Continue to Monitor and Trend and repeat CMP in the AM  CKD Stage 3a -BUN/Cr Trend: Recent Labs  Lab 03/14/23 0324 03/15/23 0258 03/29/23 1042 03/30/23 0249 03/30/23 1540 03/31/23 0255 04/01/23 0248  BUN 33* 35* 16 16 17 16 16   CREATININE 1.51* 1.47* 1.35* 1.23* 1.21* 1.17* 1.10*  -Avoid Nephrotoxic Medications, Contrast Dyes, Hypotension and Dehydration to Ensure Adequate Renal Perfusion and will need to Renally Adjust Meds -Continue to Monitor and Trend Renal Function carefully and repeat CMP in the AM    DVT prophylaxis:  apixaban (ELIQUIS) tablet 5 mg    Code Status: Full Code Family Communication: Discussed with Son at bedside  Disposition Plan:  Level of care: Telemetry Status is: Inpatient Remains inpatient appropriate because: Needs further clinical improvement and clearance by cardiology   Consultants:  PCCM Cardiology  Procedures:  ECHOCARDIOGRAM IMPRESSIONS     1. Left ventricular ejection fraction, by estimation, is 30 to 35%. The  left ventricle has moderately decreased function. The left ventricle  demonstrates global hypokinesis. Left ventricular diastolic parameters are  indeterminate.   2. Right ventricular systolic function is normal. The right ventricular  size is normal.   3. Left atrial size was severely dilated.   4. Right atrial size was moderately dilated.   5. Moderate pleural effusion in the left lateral region.   6. The mitral valve is degenerative. Mild mitral valve regurgitation. No  evidence of mitral stenosis.   7. The aortic valve is tricuspid. Aortic valve regurgitation is not  visualized. Aortic valve sclerosis is present, with no evidence of aortic  valve stenosis.   FINDINGS   Left Ventricle: Left  ventricular ejection fraction, by estimation, is 30  to 35%. The left ventricle has moderately decreased function. The left  ventricle demonstrates global hypokinesis. The left ventricular internal  cavity size was normal in size.  There is no left ventricular hypertrophy. Abnormal (paradoxical) septal  motion, consistent with left bundle branch block. Left ventricular  diastolic parameters are indeterminate.   Right Ventricle: The right ventricular size is normal. Right ventricular  systolic function is normal.   Left Atrium: Left atrial size was severely dilated.   Right Atrium: Right atrial size was moderately dilated.   Pericardium: There is no evidence of pericardial effusion.   Mitral Valve: The mitral valve is degenerative in appearance. Mild mitral  valve regurgitation. No evidence of mitral valve stenosis.   Tricuspid Valve: The tricuspid valve is normal in structure. Tricuspid  valve regurgitation is mild . No evidence of tricuspid stenosis.   Aortic Valve: The aortic valve is tricuspid. Aortic valve regurgitation is  not visualized. Aortic valve sclerosis is present, with no evidence of  aortic valve stenosis.   Pulmonic Valve: The pulmonic valve was grossly normal. Pulmonic valve  regurgitation is mild. No evidence of pulmonic stenosis.   Aorta: The aortic root and ascending aorta are structurally normal, with  no evidence of dilitation.   IAS/Shunts: No atrial level shunt detected by color flow Doppler.   Additional Comments: There is a moderate pleural effusion in the left  lateral region.  LEFT VENTRICLE  PLAX 2D  LVIDd:         4.05 cm  LVIDs:         3.60 cm  LV PW:         1.25 cm  LV IVS:        1.00 cm  LVOT diam:     2.20 cm  LV SV:         53  LV SV Index:   36  LVOT Area:     3.80 cm    LV Volumes (MOD)  LV vol d, MOD A2C: 53.2 ml  LV vol d, MOD A4C: 57.9 ml  LV vol s, MOD A2C: 35.1 ml  LV vol s, MOD A4C: 36.5 ml  LV SV MOD A2C:      18.1 ml  LV SV MOD A4C:     57.9 ml  LV SV MOD BP:      19.9 ml   RIGHT VENTRICLE            IVC  RV S prime:     7.94 cm/s  IVC diam: 1.80 cm  TAPSE (M-mode): 0.9 cm   LEFT ATRIUM             Index        RIGHT ATRIUM           Index  LA diam:        4.90 cm 3.38 cm/m   RA Area:     17.90 cm  LA Vol (A2C):   61.3 ml 42.28 ml/m  RA Volume:   46.10 ml  31.79 ml/m  LA Vol (A4C):   71.7 ml 49.45 ml/m  LA Biplane Vol: 65.8 ml 45.38 ml/m   AORTIC VALVE             PULMONIC VALVE  LVOT Vmax:   94.00 cm/s  PR End Diast Vel: 1.83 msec  LVOT Vmean:  64.950 cm/s  LVOT VTI:    0.138 m    AORTA  Ao Root diam: 3.10 cm  Ao Asc diam:  3.60 cm   MITRAL VALVE               TRICUSPID VALVE  MV Area (PHT): 5.54 cm    TR Peak grad:   22.1 mmHg  MV Decel Time: 137 msec    TR Vmax:        235.00 cm/s  MV E velocity: 98.10 cm/s                             SHUNTS                             Systemic VTI:  0.14 m                             Systemic Diam: 2.20 cm   Antimicrobials:  Anti-infectives (From admission, onward)    Start     Dose/Rate Route Frequency Ordered Stop   03/29/23 1230  hydroxychloroquine (PLAQUENIL) tablet 200 mg        200 mg Oral Daily 03/29/23 1224     03/29/23 1200  ceFEPIme (MAXIPIME) 2 g in sodium chloride 0.9 % 100 mL IVPB        2 g 200 mL/hr over 30 Minutes Intravenous  Once 03/29/23 1145  03/29/23 1229   03/29/23 1200  vancomycin (VANCOCIN) IVPB 1000 mg/200 mL premix        1,000 mg 200 mL/hr over 60 Minutes Intravenous  Once 03/29/23 1150 03/29/23 1303   03/29/23 1145  ceFEPIme (MAXIPIME) 1 g in sodium chloride 0.9 % 100 mL IVPB  Status:  Discontinued        1 g 200 mL/hr over 30 Minutes Intravenous  Once 03/29/23 1141 03/29/23 1145       Subjective: Seen and examined at bedside and thinks her breathing is doing better.  Heart rate still well-controlled.  No nausea or vomiting.  Denies any lightheadedness or dizziness.  No other concerns or complaints at  this time.  Objective: Vitals:   04/01/23 1400 04/01/23 1500 04/01/23 1600 04/01/23 1652  BP:    112/84  Pulse: 89 89 88 (!) 110  Resp: 18 (!) 24 (!) 23 20  Temp:    98.3 F (36.8 C)  TempSrc:    Oral  SpO2: 96% 97% 98% 98%  Weight:      Height:        Intake/Output Summary (Last 24 hours) at 04/01/2023 1759 Last data filed at 04/01/2023 1436 Gross per 24 hour  Intake 306.22 ml  Output 650 ml  Net -343.78 ml   Filed Weights   03/29/23 1025 03/30/23 0500 03/31/23 0403  Weight: 58 kg 48.7 kg 49.2 kg   Examination: Physical Exam:  Constitutional: Thin Caucasian female in no acute distress Respiratory: Diminished to auscultation bilaterally with some coarse breath sounds, no wheezing, rales, rhonchi or crackles. Normal respiratory effort and patient is not tachypenic. No accessory muscle use.  Cardiovascular: Irregularly irregular and tachycardic, no murmurs / rubs / gallops. S1 and S2 auscultated.  Minimal extremity edema Abdomen: Soft, non-tender, non-distended. Bowel sounds positive.  GU: Deferred. Musculoskeletal: No clubbing / cyanosis of digits/nails. No joint deformity upper and lower extremities. Skin: No rashes, lesions, ulcers on limited skin evaluation. No induration; Warm and dry.  Neurologic: CN 2-12 grossly intact with no focal deficits. Romberg sign and cerebellar reflexes not assessed.  Psychiatric: Normal judgment and insight. Alert and oriented x 3. Normal mood and appropriate affect.   Data Reviewed: I have personally reviewed following labs and imaging studies  CBC: Recent Labs  Lab 03/29/23 1042 03/30/23 0249 04/01/23 0757  WBC 7.9 7.4 7.1  NEUTROABS  --   --  4.5  HGB 15.5* 14.6 14.1  HCT 49.4* 44.1 43.8  MCV 101.9* 95.7 97.1  PLT 205 177 167   Basic Metabolic Panel: Recent Labs  Lab 03/29/23 1042 03/30/23 0249 03/30/23 1540 03/31/23 0255 04/01/23 0248 04/01/23 0757  NA 136 140 134* 135 129*  --   K 4.1 2.7* 3.6 4.0 4.0  --   CL 99 95*  96* 97* 93*  --   CO2 18* 26 27 25 23   --   GLUCOSE 172* 78 137* 78 92  --   BUN 16 16 17 16 16   --   CREATININE 1.35* 1.23* 1.21* 1.17* 1.10*  --   CALCIUM 9.6 9.7 9.1 9.4 9.1  --   MG 2.3 1.8  --  3.3* 2.3  --   PHOS  --  2.8  --   --   --  3.4   GFR: Estimated Creatinine Clearance: 34.4 mL/min (A) (by C-G formula based on SCr of 1.1 mg/dL (H)). Liver Function Tests: Recent Labs  Lab 03/29/23 1042  AST 76*  ALT 134*  ALKPHOS  177*  BILITOT 1.9*  PROT 8.3*  ALBUMIN 4.3   No results for input(s): "LIPASE", "AMYLASE" in the last 168 hours. No results for input(s): "AMMONIA" in the last 168 hours. Coagulation Profile: No results for input(s): "INR", "PROTIME" in the last 168 hours. Cardiac Enzymes: No results for input(s): "CKTOTAL", "CKMB", "CKMBINDEX", "TROPONINI" in the last 168 hours. BNP (last 3 results) No results for input(s): "PROBNP" in the last 8760 hours. HbA1C: No results for input(s): "HGBA1C" in the last 72 hours. CBG: Recent Labs  Lab 03/31/23 1946 03/31/23 2338 04/01/23 0344 04/01/23 0803 04/01/23 1150  GLUCAP 144* 94 102* 108* 131*   Lipid Profile: No results for input(s): "CHOL", "HDL", "LDLCALC", "TRIG", "CHOLHDL", "LDLDIRECT" in the last 72 hours. Thyroid Function Tests: No results for input(s): "TSH", "T4TOTAL", "FREET4", "T3FREE", "THYROIDAB" in the last 72 hours.  Anemia Panel: No results for input(s): "VITAMINB12", "FOLATE", "FERRITIN", "TIBC", "IRON", "RETICCTPCT" in the last 72 hours. Sepsis Labs: Recent Labs  Lab 03/29/23 1049 03/29/23 1235  LATICACIDVEN 9.0* 3.7*   Recent Results (from the past 240 hour(s))  Blood culture (routine x 2)     Status: Abnormal   Collection Time: 03/29/23 10:42 AM   Specimen: BLOOD  Result Value Ref Range Status   Specimen Description   Final    BLOOD LEFT ANTECUBITAL Performed at [facility], [address], [place], [place] [address]    Special Requests   Final    BOTTLES DRAWN AEROBIC AND  ANAEROBIC Blood Culture adequate volume Performed at [facility], [address], [place], [place] [address]    Culture  Setup Time   Final    GRAM POSITIVE RODS ANAEROBIC BOTTLE ONLY CRITICAL RESULT CALLED TO, READ BACK BY AND VERIFIED WITH: PHARMD S DAVIS [id number] AT 1415 BY CM    Culture (A)  Final    CORYNEBACTERIUM MINUTISSIMUM Standardized susceptibility testing for this organism is not available. Performed at [facility], [address], [place], [place] [address]    Report Status 04/01/2023 FINAL  Final  Resp panel by RT-PCR (RSV, Flu A&B, Covid)     Status: None   Collection Time: 03/29/23 10:43 AM   Specimen: Nasal Swab  Result Value Ref Range Status   SARS Coronavirus 2 by RT PCR NEGATIVE NEGATIVE Final    Comment: (NOTE) SARS-CoV-2 target nucleic acids are NOT DETECTED.  The SARS-CoV-2 RNA is generally detectable in upper respiratory specimens during the acute phase of infection. The lowest concentration of SARS-CoV-2 viral copies this assay can detect is 138 copies/mL. A negative result does not preclude SARS-Cov-2 infection and should not be used as the sole basis for treatment or other patient management decisions. A negative result may occur with  improper specimen collection/handling, submission of specimen other than nasopharyngeal swab, presence of viral mutation(s) within the areas targeted by this assay, and inadequate number of viral copies(<138 copies/mL). A negative result must be combined with clinical observations, patient history, and epidemiological information. The expected result is Negative.  Fact Sheet for Patients:  BloggerCourse.com  Fact Sheet for Healthcare Providers:  SeriousBroker.it  This test is no t yet approved or cleared by the [place] FDA and  has been authorized for detection and/or diagnosis of SARS-CoV-2 by FDA under an Emergency Use Authorization  (EUA). This EUA will remain  in effect (meaning this test can be used) for the duration of the COVID-19 declaration under Section 564(b)(1) of the Act, 21 U.S.C.section 360bbb-3(b)(1), unless the  authorization is terminated  or revoked sooner.       Influenza A by PCR NEGATIVE NEGATIVE Final   Influenza B by PCR NEGATIVE NEGATIVE Final    Comment: (NOTE) The Xpert Xpress SARS-CoV-2/FLU/RSV plus assay is intended as an aid in the diagnosis of influenza from Nasopharyngeal swab specimens and should not be used as a sole basis for treatment. Nasal washings and aspirates are unacceptable for Xpert Xpress SARS-CoV-2/FLU/RSV testing.  Fact Sheet for Patients: BloggerCourse.com  Fact Sheet for Healthcare Providers: SeriousBroker.it  This test is not yet approved or cleared by the [place] FDA and has been authorized for detection and/or diagnosis of SARS-CoV-2 by FDA under an Emergency Use Authorization (EUA). This EUA will remain in effect (meaning this test can be used) for the duration of the COVID-19 declaration under Section 564(b)(1) of the Act, 21 U.S.C. section 360bbb-3(b)(1), unless the authorization is terminated or revoked.     Resp Syncytial Virus by PCR NEGATIVE NEGATIVE Final    Comment: (NOTE) Fact Sheet for Patients: BloggerCourse.com  Fact Sheet for Healthcare Providers: SeriousBroker.it  This test is not yet approved or cleared by the [place] FDA and has been authorized for detection and/or diagnosis of SARS-CoV-2 by FDA under an Emergency Use Authorization (EUA). This EUA will remain in effect (meaning this test can be used) for the duration of the COVID-19 declaration under Section 564(b)(1) of the Act, 21 U.S.C. section 360bbb-3(b)(1), unless the authorization is terminated or revoked.  Performed at [facility], [address], [place], [place] [address]   Blood culture (routine x 2)     Status: None (Preliminary result)   Collection Time: 03/29/23  3:58 PM   Specimen: BLOOD  Result Value Ref Range Status   Specimen Description   Final    BLOOD BLOOD RIGHT ARM Performed at [facility], [address] [place], [place] [address]    Special Requests   Final    BOTTLES DRAWN AEROBIC AND ANAEROBIC Blood Culture adequate volume Performed at [facility], [address], [place], [place] [address]    Culture   Final    NO GROWTH 3 DAYS Performed at [facility], [address], [place], [place] [address]    Report Status PENDING  Incomplete  MRSA Next Gen by PCR, Nasal     Status: None   Collection Time: 03/29/23  6:49 PM   Specimen: Nasal Mucosa; Nasal Swab  Result Value Ref Range Status   MRSA by PCR Next Gen NOT DETECTED NOT DETECTED Final    Comment: (NOTE) The GeneXpert MRSA Assay (FDA approved for NASAL specimens only), is one component of a comprehensive MRSA colonization surveillance program. It is not intended to diagnose MRSA infection nor to guide or monitor treatment for MRSA infections. Test performance is not FDA approved in patients less than 66 years old. Performed at [facility], [address] [place], [place] [address]     Radiology Studies: No results found.  Scheduled Meds:  apixaban  5 mg Oral BID   Chlorhexidine Gluconate Cloth  6 each Topical Daily   hydroxychloroquine  200 mg Oral Daily   insulin aspart  0-15 Units Subcutaneous Q4H   metoprolol tartrate  50 mg Oral BID   mouth rinse  15 mL Mouth Rinse 4 times per day   Continuous Infusions:  amiodarone 30 mg/hr (04/01/23 1558)    LOS: 3 days   Marguerita Merles, DO  [facility] Available via Epic secure chat 7am-7pm After these hours, please refer to coverage provider listed on [email or web address] 04/01/2023, 5:59 PM

## 2023-04-01 NOTE — Progress Notes (Signed)
   04/01/23 1927  BiPAP/CPAP/SIPAP  BiPAP/CPAP/SIPAP Pt Type Adult  Reason BIPAP/CPAP not in use Non-compliant

## 2023-04-02 ENCOUNTER — Ambulatory Visit: Payer: 59 | Admitting: Physician Assistant

## 2023-04-02 ENCOUNTER — Encounter: Payer: Self-pay | Admitting: Radiation Oncology

## 2023-04-02 ENCOUNTER — Inpatient Hospital Stay (HOSPITAL_COMMUNITY): Payer: 59

## 2023-04-02 ENCOUNTER — Other Ambulatory Visit (HOSPITAL_COMMUNITY): Payer: Self-pay

## 2023-04-02 DIAGNOSIS — I4891 Unspecified atrial fibrillation: Secondary | ICD-10-CM | POA: Diagnosis not present

## 2023-04-02 DIAGNOSIS — I482 Chronic atrial fibrillation, unspecified: Secondary | ICD-10-CM

## 2023-04-02 DIAGNOSIS — R7989 Other specified abnormal findings of blood chemistry: Secondary | ICD-10-CM | POA: Diagnosis not present

## 2023-04-02 DIAGNOSIS — Z7901 Long term (current) use of anticoagulants: Secondary | ICD-10-CM | POA: Diagnosis not present

## 2023-04-02 DIAGNOSIS — I5023 Acute on chronic systolic (congestive) heart failure: Secondary | ICD-10-CM | POA: Diagnosis not present

## 2023-04-02 LAB — COMPREHENSIVE METABOLIC PANEL
ALT: 49 U/L — ABNORMAL HIGH (ref 0–44)
AST: 37 U/L (ref 15–41)
Albumin: 3.3 g/dL — ABNORMAL LOW (ref 3.5–5.0)
Alkaline Phosphatase: 113 U/L (ref 38–126)
Anion gap: 11 (ref 5–15)
BUN: 17 mg/dL (ref 8–23)
CO2: 27 mmol/L (ref 22–32)
Calcium: 9.4 mg/dL (ref 8.9–10.3)
Chloride: 95 mmol/L — ABNORMAL LOW (ref 98–111)
Creatinine, Ser: 1.1 mg/dL — ABNORMAL HIGH (ref 0.44–1.00)
GFR, Estimated: 53 mL/min — ABNORMAL LOW (ref >60)
Glucose, Bld: 91 mg/dL (ref 70–99)
Potassium: 4.1 mmol/L (ref 3.5–5.1)
Sodium: 133 mmol/L — ABNORMAL LOW (ref 135–145)
Total Bilirubin: 0.8 mg/dL (ref 0.3–1.2)
Total Protein: 6.4 g/dL — ABNORMAL LOW (ref 6.5–8.1)

## 2023-04-02 LAB — CULTURE, BLOOD (ROUTINE X 2): Special Requests: ADEQUATE

## 2023-04-02 LAB — CBC WITH DIFFERENTIAL/PLATELET
Abs Immature Granulocytes: 0.01 10*3/uL (ref 0.00–0.07)
Basophils Absolute: 0.1 10*3/uL (ref 0.0–0.1)
Basophils Relative: 1 %
Eosinophils Absolute: 0.3 10*3/uL (ref 0.0–0.5)
Eosinophils Relative: 6 %
HCT: 40.9 % (ref 36.0–46.0)
Hemoglobin: 13.4 g/dL (ref 12.0–15.0)
Immature Granulocytes: 0 %
Lymphocytes Relative: 24 %
Lymphs Abs: 1.4 10*3/uL (ref 0.7–4.0)
MCH: 31.3 pg (ref 26.0–34.0)
MCHC: 32.8 g/dL (ref 30.0–36.0)
MCV: 95.6 fL (ref 80.0–100.0)
Monocytes Absolute: 0.8 10*3/uL (ref 0.1–1.0)
Monocytes Relative: 13 %
Neutro Abs: 3.3 10*3/uL (ref 1.7–7.7)
Neutrophils Relative %: 56 %
Platelets: 169 10*3/uL (ref 150–400)
RBC: 4.28 MIL/uL (ref 3.87–5.11)
RDW: 16.7 % — ABNORMAL HIGH (ref 11.5–15.5)
WBC: 5.8 10*3/uL (ref 4.0–10.5)
nRBC: 0 % (ref 0.0–0.2)

## 2023-04-02 LAB — MAGNESIUM: Magnesium: 2.1 mg/dL (ref 1.7–2.4)

## 2023-04-02 LAB — GLUCOSE, CAPILLARY
Glucose-Capillary: 86 mg/dL (ref 70–99)
Glucose-Capillary: 90 mg/dL (ref 70–99)
Glucose-Capillary: 112 mg/dL — ABNORMAL HIGH (ref 70–99)
Glucose-Capillary: 109 mg/dL — ABNORMAL HIGH (ref 70–99)

## 2023-04-02 LAB — PHOSPHORUS: Phosphorus: 3.5 mg/dL (ref 2.5–4.6)

## 2023-04-02 MED ORDER — POLYETHYLENE GLYCOL 3350 17 GM/SCOOP PO POWD
17.0000 g | Freq: Every day | ORAL | 0 refills | Status: DC | PRN
  Filled 2023-04-02: qty 238, 14d supply, fill #0

## 2023-04-02 MED ORDER — DOCUSATE SODIUM 100 MG PO CAPS: 100.0000 mg | ORAL_CAPSULE | Freq: Two times a day (BID) | ORAL | 0 refills | Status: DC | PRN

## 2023-04-02 MED ORDER — POLYETHYLENE GLYCOL 3350 17 G PO PACK: 17.0000 g | PACK | Freq: Every day | ORAL | 0 refills | Status: DC | PRN

## 2023-04-02 MED ORDER — METOPROLOL SUCCINATE ER 100 MG PO TB24: 100.0000 mg | ORAL_TABLET | Freq: Every day | ORAL | 0 refills | Status: DC

## 2023-04-02 MED ORDER — AMIODARONE HCL 200 MG PO TABS: 200.0000 mg | ORAL_TABLET | Freq: Two times a day (BID) | ORAL | Status: DC

## 2023-04-02 MED ORDER — METOPROLOL SUCCINATE ER 100 MG PO TB24
100.0000 mg | ORAL_TABLET | Freq: Every day | ORAL | Status: DC
  Administered 2023-04-02: 100 mg via ORAL
  Filled 2023-04-02: qty 1

## 2023-04-02 MED ORDER — AMIODARONE HCL 200 MG PO TABS: ORAL_TABLET | ORAL | 0 refills | Status: DC

## 2023-04-02 MED ORDER — AMIODARONE HCL 200 MG PO TABS
200.0000 mg | ORAL_TABLET | Freq: Two times a day (BID) | ORAL | Status: DC
  Administered 2023-04-02: 200 mg via ORAL
  Filled 2023-04-02: qty 1

## 2023-04-02 MED ORDER — DOCUSATE SODIUM 100 MG PO CAPS
100.0000 mg | ORAL_CAPSULE | Freq: Two times a day (BID) | ORAL | 0 refills | Status: DC | PRN
  Filled 2023-04-02: qty 10, 5d supply, fill #0

## 2023-04-02 MED ORDER — METOPROLOL SUCCINATE ER 100 MG PO TB24
100.0000 mg | ORAL_TABLET | Freq: Every day | ORAL | 0 refills | Status: DC
  Filled 2023-04-02 (×2): qty 30, 30d supply, fill #0

## 2023-04-02 MED ORDER — AMIODARONE HCL 200 MG PO TABS
ORAL_TABLET | ORAL | 0 refills | Status: DC
  Filled 2023-04-02 (×2): qty 42, 35d supply, fill #0

## 2023-04-02 NOTE — Plan of Care (Signed)
  Problem: Education: °Goal: Knowledge of General Education information will improve °Description: Including pain rating scale, medication(s)/side effects and non-pharmacologic comfort measures °Outcome: Completed/Met °  °

## 2023-04-02 NOTE — Care Management Important Message (Signed)
Important Message  Patient Details IM Letter given. Name: Rhonda Marshall MRN: [id number] Date of Birth: 1951/04/18   Medicare Important Message Given:  Yes     Caren Macadam 04/02/2023, 3:53 PM

## 2023-04-02 NOTE — Evaluation (Signed)
Occupational Therapy Evaluation Patient Details Name: Rhonda Marshall MRN: [id number] DOB: Mar 19, 1951 Today's Date: 04/02/2023   History of Present Illness 72 yr old female  who presents 03/29/23 for  progressive dyspnea .  Chest x-ray was done and showed significant pulmonary edema and bilateral pleural effusions compared to 03/13/2023; pt also with hypotension, AKI, and persistent A-fib with RVR.  PMH: atrial fibrillation, chronic systolic heart failure EF 30%, LBBB, right carotid artery stenosis, laryngeal cancer, hyperparathyroidism, rheumatoid arthritis on immunosuppressive's, anxiety   Clinical Impression   The pt appears to be at or very near to her baseline level of functioning for self-care management, as she completed all assessed tasks with supervision or better, including lower body dressing, sit to stand, and toileting at bathroom level. She is not presenting with acute functional deficits that warrant the need for further OT services.  OT will sign off.       If plan is discharge home, recommend the following: Assistance with cooking/housework;Direct supervision/assist for medications management;Assist for transportation    Functional Status Assessment  Patient has not had a recent decline in their functional status  Equipment Recommendations  None recommended by OT    Recommendations for Other Services       Precautions / Restrictions Precautions Precautions: Fall Precaution Comments: monitor HR, sats Restrictions Weight Bearing Restrictions: No      Mobility Bed Mobility Overal bed mobility: Modified Independent Bed Mobility: Sit to Supine, Supine to Sit     Supine to sit: Modified independent (Device/Increase time) Sit to supine: Modified independent (Device/Increase time)        Transfers Overall transfer level: Needs assistance Equipment used: None Transfers: Sit to/from Stand Sit to Stand: Supervision                  Balance Overall  balance assessment: No apparent balance deficits (not formally assessed)         ADL either performed or assessed with clinical judgement   ADL Overall ADL's : At baseline         Vision   Additional Comments: She correctly read the time depicted on the wall clock.            Pertinent Vitals/Pain Pain Assessment Pain Assessment: No/denies pain     Extremity/Trunk Assessment Upper Extremity Assessment Upper Extremity Assessment: Overall WFL for tasks assessed   Lower Extremity Assessment Lower Extremity Assessment: Overall WFL for tasks assessed      Communication Communication Communication: Hearing impairment   Cognition Arousal: Alert Behavior During Therapy: WFL for tasks assessed/performed Overall Cognitive Status: Difficult to assess Area of Impairment: Attention          General Comments: Tangenital speech, Able to follow 1 step commands without difficulty, Oriented x4                Home Living Family/patient expects to be discharged to:: Private residence Living Arrangements: Son, daughter-in-law and 2 grandsons Available Help at Discharge: Family Type of Home: House Home Access: Stairs to enter Secretary/administrator of Steps: 2-3 Home Layout: Two level;Able to live on main level with bedroom/bathroom Alternate Level Stairs-Number of Steps: her bedroom is on the main level   Bathroom Shower/Tub: Producer, television/film/video: Standard     Home Equipment: Pharmacist, hospital (2 wheels);BSC/3in1          Prior Functioning/Environment Prior Level of Function : Independent/Modified Independent             Mobility Comments:  Independent with ambulation ADLs Comments: She was independent with ADLs. Her family managed most of the household chores. She drives, however not in the past couple weeks.              OT Treatment/Interventions:  (no further OT treatment needs identified)       OT Frequency:  (N/A)        AM-PAC OT "6 Clicks" Daily Activity     Outcome Measure Help from another person eating meals?: None Help from another person taking care of personal grooming?: None Help from another person toileting, which includes using toliet, bedpan, or urinal?: None Help from another person bathing (including washing, rinsing, drying)?: None Help from another person to put on and taking off regular upper body clothing?: None Help from another person to put on and taking off regular lower body clothing?: None 6 Click Score: 24   End of Session Equipment Utilized During Treatment: Other (comment) (N/A) Nurse Communication: Other (comment)  Activity Tolerance: Patient tolerated treatment well Patient left: in bed;with call bell/phone within reach;with bed alarm set;with family/visitor present  OT Visit Diagnosis: Muscle weakness (generalized) (M62.81)                Time: 1610-9604 OT Time Calculation (min): 27 min Charges:  OT General Charges $OT Visit: 1 Visit OT Evaluation $OT Eval Low Complexity: 1 Low    Kamrie Fanton L Gurley Climer, OTR/L 04/02/2023, 4:19 PM

## 2023-04-02 NOTE — TOC Transition Note (Signed)
Transition of Care [facility]) - CM/SW Discharge Note   Patient Details  Name: Rhonda Marshall MRN: [id number] Date of Birth: 12/26/1950  [facility] CM/SW Contact:  Howell Rucks, RN Phone Number: 04/02/2023, 3:52 PM   Clinical Narrative:   Met with pt and sister Dewayne Hatch) at bedside to introduce role of TOC/NCM and review for dc planning. Ann confirmed pt resides with her son and dtr in law, with good family support. Ann reports pt will stay home with son dtr in law for a  couple of weeks and then come and live with her for a couple of weeks. Dewayne Hatch reports family feels comfortable with dc today. Son will provide transportation. No further TOC needs identified.     Final next level of care: Home/Self Care Barriers to Discharge: Barriers Resolved   Patient Goals and CMS Choice      Discharge Placement                         Discharge Plan and Services Additional resources added to the After Visit Summary for                                       Social Determinants of Health (SDOH) Interventions SDOH Screenings   Food Insecurity: Patient Declined (03/29/2023)  Housing: Patient Declined (03/29/2023)  Transportation Needs: Patient Declined (03/29/2023)  Utilities: Not At Risk (03/29/2023)  Tobacco Use: Medium Risk (03/29/2023)     Readmission Risk Interventions    04/02/2023    3:52 PM 03/30/2023    1:51 PM  Readmission Risk Prevention Plan  Transportation Screening Complete Complete  PCP or Specialist Appt within 5-7 Days Complete Complete  Home Care Screening Complete Complete  Medication Review (RN CM) Complete Complete

## 2023-04-02 NOTE — Progress Notes (Signed)
Reviewed AVS discharge instructions with patient and family including medication list, medication changes, and follow up appointments. Patient and family verbalized understanding. Seleni Meller, Yancey Flemings, RN

## 2023-04-02 NOTE — Progress Notes (Signed)
This encounter was created in error - please disregard.

## 2023-04-02 NOTE — Progress Notes (Signed)
SATURATION QUALIFICATIONS: (This note is used to comply with regulatory documentation for home oxygen)  Patient Saturations on Room Air at Rest = 97%  Patient Saturations on Room Air while Ambulating = 96%  Patient did not require any oxygen while ambulatory.  Yarexi Pawlicki, Yancey Flemings, RN

## 2023-04-02 NOTE — Evaluation (Signed)
Physical Therapy Evaluation Patient Details Name: Rhonda Marshall MRN: [id number] DOB: 1951/01/05 Today's Date: 04/02/2023  History of Present Illness  72 y.o. female  who presents 03/29/23 for  progressive dyspnea .  Chest x-ray was done and showed significant pulmonary edema and bilateral pleural effusions compared to 03/13/2023. ; hypotension; AKI; persistent A-fib with RVR.  PMH significant of atrial fibrillation on Eliquis, chronic systolic heart failure EF 30%, LBBB, right carotid artery stenosis, laryngeal cancer, hyperparathyroidism, rheumatoid arthritis on immunosuppressive's, anxiety  Clinical Impression  The patient is ambulating well, Ambulated x 180' with RW. Patient has family support. No further acute PT, can be followed by nursing/mobililty specialist. Patient is  supposed to Dc home today.         If plan is discharge home, recommend the following: Help with stairs or ramp for entrance   Can travel by private vehicle        Equipment Recommendations None recommended by PT  Recommendations for Other Services       Functional Status Assessment Patient has not had a recent decline in their functional status     Precautions / Restrictions Precautions Precautions: Fall Precaution Comments: monitor HR, sats      Mobility  Bed Mobility Overal bed mobility: Modified Independent                  Transfers Overall transfer level: Needs assistance   Transfers: Sit to/from Stand Sit to Stand: Supervision           General transfer comment: stood with no device    Ambulation/Gait Ambulation/Gait assistance: Supervision Gait Distance (Feet): 180 Feet Assistive device: Rolling walker (2 wheels) Gait Pattern/deviations: WFL(Within Functional Limits)       General Gait Details: pt talking throughout ambulation  Stairs            Wheelchair Mobility     Tilt Bed    Modified Rankin (Stroke Patients Only)       Balance Overall balance  assessment: Mild deficits observed, not formally tested                                           Pertinent Vitals/Pain Pain Assessment Faces Pain Scale: Hurts little more Pain Descriptors / Indicators: Aching, Discomfort Pain Intervention(s): Monitored during session    Home Living Family/patient expects to be discharged to:: Private residence Living Arrangements: Children   Type of Home: House Home Access: Stairs to enter Entrance Stairs-Rails: Can reach both Entrance Stairs-Number of Steps: 3   Home Layout: Two level;Able to live on main level with bedroom/bathroom Home Equipment: Shower seat;Rolling Walker (2 wheels);BSC/3in1      Prior Function Prior Level of Function : Independent/Modified Independent             Mobility Comments: ind no AD. ADLs Comments: ind     Extremity/Trunk Assessment        Lower Extremity Assessment Lower Extremity Assessment: Overall WFL for tasks assessed    Cervical / Trunk Assessment Cervical / Trunk Assessment: Kyphotic  Communication   Communication Communication: Hearing impairment  Cognition Arousal: Alert Behavior During Therapy: WFL for tasks assessed/performed Overall Cognitive Status: Within Functional Limits for tasks assessed                                 General  Comments: Tangenital speech        General Comments      Exercises     Assessment/Plan    PT Assessment Patient does not need any further PT services  PT Problem List         PT Treatment Interventions      PT Goals (Current goals can be found in the Care Plan section)  Acute Rehab PT Goals Patient Stated Goal: go home PT Goal Formulation: All assessment and education complete, DC therapy    Frequency       Co-evaluation               AM-PAC PT "6 Clicks" Mobility  Outcome Measure Help needed turning from your back to your side while in a flat bed without using bedrails?: None Help needed  moving from lying on your back to sitting on the side of a flat bed without using bedrails?: None Help needed moving to and from a bed to a chair (including a wheelchair)?: None Help needed standing up from a chair using your arms (e.g., wheelchair or bedside chair)?: None Help needed to walk in hospital room?: A Little Help needed climbing 3-5 steps with a railing? : A Little 6 Click Score: 22    End of Session   Activity Tolerance: Patient tolerated treatment well Patient left: in bed;with call bell/phone within reach;with bed alarm set Nurse Communication: Mobility status;Other (comment) PT Visit Diagnosis: Difficulty in walking, not elsewhere classified (R26.2)    Time: 1344-1401 PT Time Calculation (min) (ACUTE ONLY): 17 min   Charges:   PT Evaluation $PT Eval Low Complexity: 1 Low   PT General Charges $$ ACUTE PT VISIT: 1 Visit         Blanchard Kelch PT Acute Rehabilitation Services Office [phone] Weekend [phone]   Rada Hay 04/02/2023, 3:44 PM

## 2023-04-02 NOTE — Progress Notes (Addendum)
Patient Name: Rhonda Marshall Date of Encounter: 04/02/2023 [facility] Cardiologist: Rollene Rotunda, MD   Interval Summary  .    Not able to articulate symptoms however she does feel better.  Heart rates look better controlled today.  Generally less than 110 and between 80-100.  She did ambulate today and heart rates have gone up to 130s but generally well-controlled  Vital Signs .    Vitals:   04/01/23 2203 04/02/23 0107 04/02/23 0418 04/02/23 0420  BP: (!) 116/90 112/65 113/76   Pulse: (!) 109 75 94   Resp: 18 12 (!) 22   Temp: 98.2 F (36.8 C) 98.2 F (36.8 C) 98.3 F (36.8 C)   TempSrc: Oral Oral Oral   SpO2: 98% 95% 98%   Weight:    52.1 kg  Height:        Intake/Output Summary (Last 24 hours) at 04/02/2023 0854 Last data filed at 04/02/2023 0404 Gross per 24 hour  Intake 191.68 ml  Output 400 ml  Net -208.32 ml      04/02/2023    4:20 AM 03/31/2023    4:03 AM 03/30/2023    5:00 AM  Last 3 Weights  Weight (lbs) 114 lb 13.8 oz 108 lb 7.5 oz 107 lb 5.8 oz  Weight (kg) 52.1 kg 49.2 kg 48.7 kg      Telemetry/ECG    Atrial fibrillation heart rate between 80-100.  Has bouts where she reaches 130s with ambulation- Personally Reviewed  CV Studies    Echocardiogram 03/30/2023  1. Left ventricular ejection fraction, by estimation, is 30 to 35%. The  left ventricle has moderately decreased function. The left ventricle  demonstrates global hypokinesis. Left ventricular diastolic parameters are  indeterminate.   2. Right ventricular systolic function is normal. The right ventricular  size is normal.   3. Left atrial size was severely dilated.   4. Right atrial size was moderately dilated.   5. Moderate pleural effusion in the left lateral region.   6. The mitral valve is degenerative. Mild mitral valve regurgitation. No  evidence of mitral stenosis.   7. The aortic valve is tricuspid. Aortic valve regurgitation is not  visualized. Aortic valve sclerosis  is present, with no evidence of aortic  valve stenosis.   Physical Exam .   GEN: No acute distress.   Neck: No JVD Cardiac: RRR, no murmurs, rubs, or gallops.  Respiratory: Clear to auscultation bilaterally. GI: Soft, nontender, non-distended  MS: No edema  Patient Profile    Rhonda Marshall is a 72 y.o. female has hx of  ongstanding persistent atrial fibrillation, left bundle branch block, hypertension, chronic HFrEF, RA  and admitted on 04/01/2023 for the evaluation of A-fib RVR  Assessment & Plan .     Longstanding persistent atrial fibrillation with RVR She has been difficult to manage from a rate control standpoint due to hypotension and inability to maintain normal sinus rhythm with previous failure of cardioversion with and without amiodarone.  Because of this amiodarone had previously been stopped.  Today, she has much better rates between 80-100 with brief episodes in the 130s with ambulation.  Continue IV amiodarone.  Will defer to MD about transitioning to oral depending on if felt she has been sufficiently loaded.   Eliquis 5 mg twice daily, Will transition lopressor to Toprol XL 100mg  as this will have less effect on BP and add to her GDMT.   Chronic HFrEF Thought to be nonischemic due to normal perfusion scan in  2018.  Echocardiogram this admission shows EF 30 to 35% with normal RV function.  Mild MR.  She has been successfully diuresed and euvolemic and resting comfortably.  GDMT has been difficult to titrate due to recent hospital admission for pneumonia and chronic hypotension. Was not on diuretics PTA likely due to hypotension. Euvolemic.  Previously SGLT2 inhibitor was stopped due to UTI, Entresto and spironolactone stopped due to hypotension.  Continue beta-blocker as above.  CKD Appears stable. 1.1 today   For questions or updates, please contact [facility] Please consult [email or web address] for contact info under        Signed, Abagail Kitchens, PA-C

## 2023-04-03 ENCOUNTER — Other Ambulatory Visit: Payer: Self-pay | Admitting: Cardiology

## 2023-04-03 LAB — CULTURE, BLOOD (ROUTINE X 2)
Culture: NO GROWTH
Special Requests: ADEQUATE

## 2023-04-04 NOTE — Discharge Summary (Signed)
Physician Discharge Summary   Patient: Rhonda Marshall MRN: [id number] DOB: [birth year]  Admit date:     03/29/2023  Discharge date: 04/02/2023  Discharge Physician: Marguerita Merles, DO   PCP: Knox Royalty, MD   Recommendations at discharge:   Follow-up with PCP within 1 to 2 weeks and repeat CBC, CMP, mag, Phos within 1 week Follow-up with cardiology in outpatient setting within 1 to 2 weeks Repeat chest x-ray in 3 to 6 weeks  Discharge Diagnoses: Principal Problem:   Acute on chronic systolic CHF (congestive heart failure) (HCC) Active Problems:   Longstanding persistent atrial fibrillation (HCC)   Anticoagulation adequate   Hypokalemia   Elevated LFTs   Atrial fibrillation with RVR (HCC)  Resolved Problems:   * No resolved hospital problems. [facility] Course: The patient  is a 72 year old woman history of CHF EF 30% permanent atrial fibrillation recent hospitalization for community-acquired pneumonia where all evidence paced medications for heart failure except beta-blocker were stopped presents with decompensated heart failure, acute hypoxemic respiratory failure on BiPAP.  She was hospitalized and her Sherryll Burger was stopped and no alternatives were offered.  She began to have progressive dyspnea last week and was worse 24 hours prior to admission.  Chest x-ray was done and showed significant pulmonary edema and bilateral pleural effusions compared to 03/13/2023.  She was admitted to the PCCM service and transferred to the hospitalist service on 03/31/2023.  Echo was done and showed EF of 30 to 35%.  She was diuresed and now being placed on IV amiodarone drip given her persistent A-fib with RVR.  Diuresis has been held today and cardiology recommends not resuming given her history of hypertension.  She was on IV amiodarone and was transitioned to oral amiodarone taper.  Cardiology felt that she could be safely discharged and uptitrated her beta-blocker.  Her repeat chest x-ray showed no  active disease and now she is stable for discharge and ambulated without issues or dyspnea and will need to follow-up with PCP and cardiology outpatient setting.   Interim History:  Acute on Chronic Systolic CHF (congestive heart failure) (HCC) -Admitted to ICU under care of PCCM. Underwent diuresis with IV lasix and responded quite well and quickly to the diuresis -GDMT is limited given the she was taken off of empagliflozin due to urinary tract infection history and is not able to tolerate Entresto due to hypotension -Echo was done and showed an EF of 30 to 35% with left ventricule demonstrating global hypokinesis with the left ventricular diastolic parameters being indeterminate -Strict I's and O's and daily weights -Continues remain in atrial fibrillation but has better rate control on the IV amiodarone and Toprol -Unable to tolerate Entresto and spironolactone due to hypotension -Cardiology has now recommended transition to oral amiodarone and switching her to the amiodarone taper and increased her Toprol-XL to 100 mg daily -Cardiology still does not have a clear etiology of why her left ventricular ejection fraction has declined but may be related to uncontrolled atrial fibrillation -Cardiology feels that she is euvolemic and feels that she does not need any additional diuresis for discharge given that she was hypotensive previously -Cardiology also not initiating SGLT2 inhibitor given her history of UTIs and Entresto spironolactone were stopped due to hypotension -Follow-up with cardiology outpatient setting and repeat chest x-ray in 3 to 6 weeks   Persistent Atrial Fibrillation with rapid ventricular rate and borderline hypotension, stable -Cardiology was consulted and given that she is overall asymptomatic but continues to have overall  heart rates in the 100s to 130s.  Has been previously difficult to manage given her hypotension and has an inability to maintain normal sinus rhythm even  with previous failure of cardioversion with and without amiodarone. -Currently amiodarone has been initiated and she is on IV amiodarone bolus and infusion and cardiology team has now transitioned her to oral amiodarone taper with 200 mg twice daily for 7 days and then just 200 mg daily after -Blood pressures has been on the lower side and has been hypotensive so she is on metoprolol succinate 50 mg p.o. daily which was increased to 100 mg daily. -Appears euvolemic to hypovolemic now and is on room air -Her Hydralazine has been discontinued -Continue to monitor on telemetry and patient is now on anticoagulation with apixaban as well -Continue monitor heart rates in outpatient setting  Elevated LFTs, improving  -Likely due to hepatic congestion from CHF. -Liver Fxn Panel Trend: Recent Labs  Lab 03/11/23 0755 03/12/23 0325 03/13/23 0326 03/14/23 0324 03/15/23 0258 03/29/23 1042 04/02/23 0529  AST 43* 46* 38 44* 53* 76* 37  ALT 29 33 33 44 58* 134* 49*  BILITOT 0.6 0.8 0.7 0.7 0.6 1.9* 0.8  ALKPHOS 246* 235* 223* 211* 196* 177* 113  -Continue to Monitor and Trend Hepatic Fxn; Repeat CMP within 1 week  Hypokalemia -Patient's K+ Level Trend: Recent Labs  Lab 03/15/23 0258 03/29/23 1042 03/30/23 0249 03/30/23 1540 03/31/23 0255 04/01/23 0248 04/02/23 0529  K 4.5 4.1 2.7* 3.6 4.0 4.0 4.1  -Continue to Monitor and Replete as Necessary -Repeat CMP in the AM   Hyponatremia -Na+ Trend: Recent Labs  Lab 03/15/23 0258 03/29/23 1042 03/30/23 0249 03/30/23 1540 03/31/23 0255 04/01/23 0248 04/02/23 0529  NA 127* 136 140 134* 135 129* 133*  -Continue to Monitor and Trend and repeat CMP in the AM  CKD Stage 3a -BUN/Cr Trend: Recent Labs  Lab 03/15/23 0258 03/29/23 1042 03/30/23 0249 03/30/23 1540 03/31/23 0255 04/01/23 0248 04/02/23 0529  BUN 35* 16 16 17 16 16 17   CREATININE 1.47* 1.35* 1.23* 1.21* 1.17* 1.10* 1.10*  -Avoid Nephrotoxic Medications, Contrast Dyes,  Hypotension and Dehydration to Ensure Adequate Renal Perfusion and will need to Renally Adjust Meds -Continue to Monitor and Trend Renal Function carefully and repeat CMP within 1 week  Hypoalbuminemia -Patient's Albumin Trend: Recent Labs  Lab 03/11/23 0755 03/12/23 0325 03/13/23 0326 03/14/23 0324 03/15/23 0258 03/29/23 1042 04/02/23 0529  ALBUMIN 2.8* 2.7* 2.7* 2.8* 2.8* 4.3 3.3*  -Continue to Monitor and Trend and repeat CMP in the AM  Consultants: Cardiology Procedures performed: ECHOCARDIOGRAM  Disposition: Home Diet recommendation:  Discharge Diet Orders (From admission, onward)     Start     Ordered   04/02/23 0000  Diet - low sodium heart healthy       Comments: DIET Dysphagia 3   04/02/23 1458           Cardiac diet DISCHARGE MEDICATION: Allergies as of 04/02/2023       Reactions   Plaquenil [hydroxychloroquine] Other (See Comments)   "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"    Clindamycin/lincomycin Rash   Latex Rash   Latex gloves        Medication List     STOP taking these medications    empagliflozin 10 MG Tabs tablet Commonly known as: JARDIANCE   Entresto 24-26 MG Generic drug: sacubitril-valsartan       TAKE these medications    acetaminophen 500 MG tablet Commonly known as: TYLENOL Take  500 mg by mouth as needed for moderate pain.   ALPRAZolam 1 MG tablet Commonly known as: XANAX Take 1 mg by mouth 4 (four) times daily as needed for anxiety.   amiodarone 200 MG tablet Commonly known as: PACERONE Take 1 tablet (200 mg total) by mouth 2 (two) times daily for 7 days, THEN 1 tablet (200 mg total) daily for 28 days. Start taking on: April 02, 2023 What changed: See the new instructions.   docusate sodium 100 MG capsule Commonly known as: COLACE Take 1 capsule (100 mg total) by mouth 2 (two) times daily as needed for mild constipation.   Eliquis 5 MG Tabs tablet Generic drug: apixaban Take 1 tablet (5 mg total) by mouth 2 (two) times  daily.   hydroxychloroquine 200 MG tablet Commonly known as: PLAQUENIL Take 1 tablet (200 mg total) by mouth daily.   methimazole 5 MG tablet Commonly known as: TAPAZOLE Take 5 mg by mouth daily.   metoprolol succinate 100 MG 24 hr tablet Commonly known as: TOPROL-XL Take 1 tablet (100 mg total) by mouth daily. Take with or immediately following a meal. What changed: when to take this   multivitamin with minerals Tabs tablet Take 1 tablet by mouth daily.   oxyCODONE-acetaminophen 10-325 MG tablet Commonly known as: PERCOCET Take 1 tablet by mouth every 4 (four) hours as needed for pain.   polyethylene glycol powder 17 GM/SCOOP powder Commonly known as: GLYCOLAX/MIRALAX Take 17 g by mouth daily as needed for moderate constipation.   Vitamin D (Ergocalciferol) 1.25 MG (50000 UNIT) Caps capsule Commonly known as: DRISDOL Take 50,000 Units by mouth once a week.        Follow-up Information     [facility]alee Course, [place] Follow up.   Specialties: Cardiology, Radiology Why: Thursday Apr 16, 2023 Appt at 10:55 AM (25 min) Contact information: [address] [place] [address] [phone]                Discharge Exam: Ceasar Mons Weights   03/30/23 0500 03/31/23 0403 04/02/23 0420  Weight: 48.7 kg 49.2 kg 52.1 kg   Vitals:   04/02/23 0418 04/02/23 1212  BP: 113/76 116/81  Pulse: 94 88  Resp: (!) 22 20  Temp: 98.3 F (36.8 C) 98.4 F (36.9 C)  SpO2: 98% 97%   Examination: Physical Exam:  Constitutional: WN/WD occasion female in no acute distress appears calm Respiratory: Diminished to auscultation bilaterally, no wheezing, rales, rhonchi or crackles. Normal respiratory effort and patient is not tachypenic. No accessory muscle use.  Unlabored breathing Cardiovascular: RRR, no murmurs / rubs / gallops. S1 and S2 auscultated.  Trace extremity edema Abdomen: Soft, non-tender, non-distended. Bowel sounds positive.  GU: Deferred. Musculoskeletal: No clubbing  / cyanosis of digits/nails. No joint deformity upper and lower extremities. Skin: No rashes, lesions, ulcers on a limited skin evaluation. No induration; Warm and dry.  Neurologic: CN 2-12 grossly intact with no focal deficits. Romberg sign and cerebellar reflexes not assessed.  Psychiatric: Normal judgment and insight. Alert and oriented x 3. Normal mood and appropriate affect.   Condition at discharge: stable  The results of significant diagnostics from this hospitalization (including imaging, microbiology, ancillary and laboratory) are listed below for reference.   Imaging Studies: DG CHEST PORT 1 VIEW  Result Date: 04/02/2023 CLINICAL DATA:  Shortness of breath. EXAM: PORTABLE CHEST 1 VIEW COMPARISON:  March 29, 2023. FINDINGS: Stable cardiomegaly. Both lungs are clear. The visualized skeletal structures are unremarkable. IMPRESSION: No active  disease. Electronically Signed   By: Lupita Raider M.D.   On: 04/02/2023 08:27   ECHOCARDIOGRAM COMPLETE  Result Date: 03/30/2023    ECHOCARDIOGRAM REPORT   Patient Name:   Rhonda Marshall Date of Exam: 03/30/2023 Medical Rec #:  [id number]        Height:       61.0 in Accession #:    [id number]       Weight:       107.4 lb Date of Birth:  1950-11-23        BSA:          1.450 m Patient Age:    72 years         BP:           128/100 mmHg Patient Gender: F                HR:           67 bpm. Exam Location:  Inpatient Procedure: 2D Echo, 3D Echo, Cardiac Doppler and Color Doppler Indications:    I50.40* Unspecified combined systolic (congestive) and diastolic                 (congestive) heart failure  History:        Patient has prior history of Echocardiogram examinations, most                 recent 02/04/2023. CHF, Abnormal ECG, Arrythmias:Atrial                 Fibrillation and LBBB, Signs/Symptoms:Chest Pain, Dyspnea and                 Shortness of Breath; Risk Factors:Current Smoker. Moderate                 mitral regurgitation.  Sonographer:     Sheralyn Boatman RDCS Referring Phys: [phone] [name] IMPRESSIONS  1. Left ventricular ejection fraction, by estimation, is 30 to 35%. The left ventricle has moderately decreased function. The left ventricle demonstrates global hypokinesis. Left ventricular diastolic parameters are indeterminate.  2. Right ventricular systolic function is normal. The right ventricular size is normal.  3. Left atrial size was severely dilated.  4. Right atrial size was moderately dilated.  5. Moderate pleural effusion in the left lateral region.  6. The mitral valve is degenerative. Mild mitral valve regurgitation. No evidence of mitral stenosis.  7. The aortic valve is tricuspid. Aortic valve regurgitation is not visualized. Aortic valve sclerosis is present, with no evidence of aortic valve stenosis. FINDINGS  Left Ventricle: Left ventricular ejection fraction, by estimation, is 30 to 35%. The left ventricle has moderately decreased function. The left ventricle demonstrates global hypokinesis. The left ventricular internal cavity size was normal in size. There is no left ventricular hypertrophy. Abnormal (paradoxical) septal motion, consistent with left bundle branch block. Left ventricular diastolic parameters are indeterminate. Right Ventricle: The right ventricular size is normal. Right ventricular systolic function is normal. Left Atrium: Left atrial size was severely dilated. Right Atrium: Right atrial size was moderately dilated. Pericardium: There is no evidence of pericardial effusion. Mitral Valve: The mitral valve is degenerative in appearance. Mild mitral valve regurgitation. No evidence of mitral valve stenosis. Tricuspid Valve: The tricuspid valve is normal in structure. Tricuspid valve regurgitation is mild . No evidence of tricuspid stenosis. Aortic Valve: The aortic valve is tricuspid. Aortic valve regurgitation is not visualized. Aortic valve sclerosis is present, with no evidence of aortic  valve stenosis.  Pulmonic Valve: The pulmonic valve was grossly normal. Pulmonic valve regurgitation is mild. No evidence of pulmonic stenosis. Aorta: The aortic root and ascending aorta are structurally normal, with no evidence of dilitation. IAS/Shunts: No atrial level shunt detected by color flow Doppler. Additional Comments: There is a moderate pleural effusion in the left lateral region.  LEFT VENTRICLE PLAX 2D LVIDd:         4.05 cm LVIDs:         3.60 cm LV PW:         1.25 cm LV IVS:        1.00 cm LVOT diam:     2.20 cm LV SV:         53 LV SV Index:   36 LVOT Area:     3.80 cm  LV Volumes (MOD) LV vol d, MOD A2C: 53.2 ml LV vol d, MOD A4C: 57.9 ml LV vol s, MOD A2C: 35.1 ml LV vol s, MOD A4C: 36.5 ml LV SV MOD A2C:     18.1 ml LV SV MOD A4C:     57.9 ml LV SV MOD BP:      19.9 ml RIGHT VENTRICLE            IVC RV S prime:     7.94 cm/s  IVC diam: 1.80 cm TAPSE (M-mode): 0.9 cm LEFT ATRIUM             Index        RIGHT ATRIUM           Index LA diam:        4.90 cm 3.38 cm/m   RA Area:     17.90 cm LA Vol (A2C):   61.3 ml 42.28 ml/m  RA Volume:   46.10 ml  31.79 ml/m LA Vol (A4C):   71.7 ml 49.45 ml/m LA Biplane Vol: 65.8 ml 45.38 ml/m  AORTIC VALVE             PULMONIC VALVE LVOT Vmax:   94.00 cm/s  PR End Diast Vel: 1.83 msec LVOT Vmean:  64.950 cm/s LVOT VTI:    0.138 m  AORTA Ao Root diam: 3.10 cm Ao Asc diam:  3.60 cm MITRAL VALVE               TRICUSPID VALVE MV Area (PHT): 5.54 cm    TR Peak grad:   22.1 mmHg MV Decel Time: 137 msec    TR Vmax:        235.00 cm/s MV E velocity: 98.10 cm/s                            SHUNTS                            Systemic VTI:  0.14 m                            Systemic Diam: 2.20 cm Olga Millers MD Electronically signed by Olga Millers MD Signature Date/Time: 03/30/2023/12:01:03 PM    Final    DG Chest Port 1 View  Result Date: 03/29/2023 CLINICAL DATA:  Shortness of breath.  Syncope. EXAM: PORTABLE CHEST 1 VIEW COMPARISON:  03/13/2023 FINDINGS: The cardio  pericardial silhouette is enlarged. Bibasilar atelectasis/infiltrate with small bilateral pleural effusions, progressive in the interval. Underlying chronic interstitial  lung disease evident. Bones are diffusely demineralized. Telemetry leads overlie the chest. IMPRESSION: Progressive bibasilar atelectasis/infiltrate with small bilateral pleural effusions. Electronically Signed   By: Kennith Center M.D.   On: 03/29/2023 10:54   DG Chest Port 1 View  Result Date: 03/13/2023 CLINICAL DATA:  Shortness of breath. EXAM: PORTABLE CHEST 1 VIEW COMPARISON:  03/12/2023 FINDINGS: Stable mild cardiac enlargement. Small pleural effusions with blunting of the costophrenic angles. Increase interstitial markings compatible with mild edema. Similar appearance bibasilar pulmonary opacities. IMPRESSION: 1. Persistent bibasilar pulmonary opacities. 2. Small pleural effusions with increase interstitial markings. Electronically Signed   By: Signa Kell M.D.   On: 03/13/2023 07:48   DG Chest Port 1 View  Result Date: 03/12/2023 CLINICAL DATA:  Shortness of breath EXAM: PORTABLE CHEST 1 VIEW COMPARISON:  Chest radiograph 1 day prior and CT chest 2 days prior FINDINGS: The cardiomediastinal silhouette is stable. Patchy opacities in both lower lobes, right worse than left, are worsened in the interim. There is no pleural effusion or pneumothorax There is no acute osseous abnormality. IMPRESSION: Worsening opacities in both lower lobes, right worse than left, likely reflecting infection. Electronically Signed   By: Lesia Hausen M.D.   On: 03/12/2023 08:42   DG Chest Port 1 View  Result Date: 03/11/2023 CLINICAL DATA:  Fever and shortness of breath. EXAM: PORTABLE CHEST 1 VIEW COMPARISON:  03/10/2023 FINDINGS: There is mild cardiac enlargement. Diffusely increased interstitial markings are noted concerning for interstitial edema. Persistent bilateral lower lobe peribronchovascular airspace opacities. No signs of pleural  effusion. Visualized osseous structures are unremarkable. IMPRESSION: 1. No change from previous exam. 2. Cardiomegaly and interstitial edema. 3. Persistent bilateral lower lobe peribronchovascular airspace opacities compatible with multifocal pneumonia. Electronically Signed   By: Signa Kell M.D.   On: 03/11/2023 06:41   US Abdomen Limited RUQ (LIVER/GB)  Result Date: 03/10/2023 CLINICAL DATA:  Acute pancreatitis. EXAM: ULTRASOUND ABDOMEN LIMITED RIGHT UPPER QUADRANT COMPARISON:  CT 03/10/2023 FINDINGS: Gallbladder: No gallstones or sludge. Gallbladder wall is mildly thickened at 3.8 mm. Murphy's sign is negative. Common bile duct: Diameter: 7 mm, normal Liver: No focal lesion identified. Within normal limits in parenchymal echogenicity. Portal vein is patent on color Doppler imaging with normal direction of blood flow towards the liver. Other: Dilated pancreatic duct at 5 mm. IMPRESSION: 1. Mild gallbladder wall thickening. No evidence of cholelithiasis or acute cholecystitis. 2. Pancreatic ductal dilatation, nonspecific. Electronically Signed   By: Burman Nieves M.D.   On: 03/10/2023 22:47   CT CHEST ABDOMEN PELVIS W CONTRAST  Result Date: 03/10/2023 CLINICAL DATA:  Sepsis. EXAM: CT CHEST, ABDOMEN, AND PELVIS WITH CONTRAST TECHNIQUE: Multidetector CT imaging of the chest, abdomen and pelvis was performed following the standard protocol during bolus administration of intravenous contrast. RADIATION DOSE REDUCTION: This exam was performed according to the departmental dose-optimization program which includes automated exposure control, adjustment of the mA and/or kV according to patient size and/or use of iterative reconstruction technique. CONTRAST:  85mL OMNIPAQUE IOHEXOL 350 MG/ML SOLN COMPARISON:  Chest radiograph 03/10/2023.  CT abdomen 10/14/2019 FINDINGS: CT CHEST FINDINGS Cardiovascular: Mild cardiac enlargement. No pericardial effusions. Normal caliber thoracic aorta. No dissection.  Calcification of the aorta and coronary arteries. 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 Esophagus is decompressed. Scattered lymph nodes are not pathologically enlarged. Lungs/Pleura: Patchy and confluent airspace infiltrates throughout both lungs. Changes likely to represent multifocal pneumonia. No pleural effusions. No pneumothorax. Airways are patent. Musculoskeletal: No chest wall mass  or suspicious bone lesions identified. CT ABDOMEN PELVIS FINDINGS Hepatobiliary: Mild diffuse fatty infiltration of the liver. No focal lesions. Gallbladder wall is thickened and edematous. No stones identified. Changes could indicate cholecystitis or may be related to liver disease. Consider correlation with ultrasound. Pancreas: Diffuse pancreatic ductal dilatation. Mild parenchymal atrophy. No obstructing stone or lesion identified. No peripancreatic edema or collections. Changes could be due to early acute or chronic pancreatitis or to an occult obstructing lesion or stone. Consider MRCP for further evaluation. Spleen: Normal in size without focal abnormality. Adrenals/Urinary Tract: No adrenal gland nodules. Kidneys are symmetrical. No hydronephrosis or hydroureter. Bladder is normal. Stomach/Bowel: Stomach, small bowel, and colon are not abnormally distended. No wall thickening or inflammatory changes. Stool throughout the colon. Colonic diverticulosis without evidence of acute diverticulitis. Appendix is normal. Vascular/Lymphatic: Aortic atherosclerosis. No enlarged abdominal or pelvic lymph nodes. Reproductive: Uterus and bilateral adnexa are unremarkable. Other: No abdominal wall hernia or abnormality. No abdominopelvic ascites. Musculoskeletal: Postoperative right hip hemiarthroplasty. Degenerative changes in the spine. IMPRESSION: 1. Patchy infiltrates throughout the lungs likely representing multifocal pneumonia. 2. Incidental heterogeneous and enlarged  thyroid. Recommend non-emergent thyroid ultrasound. Reference: J Am Coll Radiol. 2015 Feb;12(2): 143-50 3. Nonspecific gallbladder wall thickening and edema. No stones. Consider ultrasound for further evaluation. 4. Diffuse pancreatic ductal dilatation with evidence of pancreatic atrophy. This could represent early acute or chronic pancreatitis but an occult obstructing lesion is not excluded. Consider follow-up with elective MRCP. 5. Aortic atherosclerosis. 6. Cardiac enlargement. Electronically Signed   By: Burman Nieves M.D.   On: 03/10/2023 18:23   DG Chest Port 1 View  Result Date: 03/10/2023 CLINICAL DATA:  Fever for 2 days.  Possible sepsis. EXAM: PORTABLE CHEST 1 VIEW COMPARISON:  01/18/2022 FINDINGS: Midline trachea. Mild cardiomegaly. Atherosclerosis in the transverse aorta. No pleural effusion or pneumothorax. Moderate pulmonary interstitial thickening is felt to be slightly increased. No lobar consolidation. IMPRESSION: Moderate pulmonary interstitial thickening, felt to be slightly increased. This could be related to smoking/chronic bronchitis (ex-smoker quitting 3 years ago). However, viral or atypical/mycoplasma pneumonia could look similar. Aortic Atherosclerosis (ICD10-I70.0). Electronically Signed   By: Jeronimo Greaves M.D.   On: 03/10/2023 15:09    Microbiology: Results for orders placed or performed during the hospital encounter of 03/29/23  Blood culture (routine x 2)     Status: Abnormal   Collection Time: 03/29/23 10:42 AM   Specimen: BLOOD  Result Value Ref Range Status   Specimen Description   Final    BLOOD LEFT ANTECUBITAL Performed at [facility], [address], [place], [place] [address]    Special Requests   Final    BOTTLES DRAWN AEROBIC AND ANAEROBIC Blood Culture adequate volume Performed at [facility], [address], [place], [place] [address]    Culture  Setup Time   Final    GRAM POSITIVE RODS IN BOTH AEROBIC AND ANAEROBIC  BOTTLES CRITICAL RESULT CALLED TO, READ BACK BY AND VERIFIED WITH: PHARMD S DAVIS [id number] AT 1415 BY CM    Culture (A)  Final    CORYNEBACTERIUM MINUTISSIMUM Standardized susceptibility testing for this organism is not available. Performed at [facility], [address], [place], [place] [address]    Report Status 04/01/2023 FINAL  Final  Resp panel by RT-PCR (RSV, Flu A&B, Covid)     Status: None   Collection Time: 03/29/23 10:43 AM   Specimen: Nasal Swab  Result Value Ref Range Status   SARS Coronavirus  2 by RT PCR NEGATIVE NEGATIVE Final    Comment: (NOTE) SARS-CoV-2 target nucleic acids are NOT DETECTED.  The SARS-CoV-2 RNA is generally detectable in upper respiratory specimens during the acute phase of infection. The lowest concentration of SARS-CoV-2 viral copies this assay can detect is 138 copies/mL. A negative result does not preclude SARS-Cov-2 infection and should not be used as the sole basis for treatment or other patient management decisions. A negative result may occur with  improper specimen collection/handling, submission of specimen other than nasopharyngeal swab, presence of viral mutation(s) within the areas targeted by this assay, and inadequate number of viral copies(<138 copies/mL). A negative result must be combined with clinical observations, patient history, and epidemiological information. The expected result is Negative.  Fact Sheet for Patients:  BloggerCourse.com  Fact Sheet for Healthcare Providers:  SeriousBroker.it  This test is no t yet approved or cleared by the [place] FDA and  has been authorized for detection and/or diagnosis of SARS-CoV-2 by FDA under an Emergency Use Authorization (EUA). This EUA will remain  in effect (meaning this test can be used) for the duration of the COVID-19 declaration under Section 564(b)(1) of the Act, 21 U.S.C.section 360bbb-3(b)(1), unless the  authorization is terminated  or revoked sooner.       Influenza A by PCR NEGATIVE NEGATIVE Final   Influenza B by PCR NEGATIVE NEGATIVE Final    Comment: (NOTE) The Xpert Xpress SARS-CoV-2/FLU/RSV plus assay is intended as an aid in the diagnosis of influenza from Nasopharyngeal swab specimens and should not be used as a sole basis for treatment. Nasal washings and aspirates are unacceptable for Xpert Xpress SARS-CoV-2/FLU/RSV testing.  Fact Sheet for Patients: BloggerCourse.com  Fact Sheet for Healthcare Providers: SeriousBroker.it  This test is not yet approved or cleared by the [place] FDA and has been authorized for detection and/or diagnosis of SARS-CoV-2 by FDA under an Emergency Use Authorization (EUA). This EUA will remain in effect (meaning this test can be used) for the duration of the COVID-19 declaration under Section 564(b)(1) of the Act, 21 U.S.C. section 360bbb-3(b)(1), unless the authorization is terminated or revoked.     Resp Syncytial Virus by PCR NEGATIVE NEGATIVE Final    Comment: (NOTE) Fact Sheet for Patients: BloggerCourse.com  Fact Sheet for Healthcare Providers: SeriousBroker.it  This test is not yet approved or cleared by the [place] FDA and has been authorized for detection and/or diagnosis of SARS-CoV-2 by FDA under an Emergency Use Authorization (EUA). This EUA will remain in effect (meaning this test can be used) for the duration of the COVID-19 declaration under Section 564(b)(1) of the Act, 21 U.S.C. section 360bbb-3(b)(1), unless the authorization is terminated or revoked.  Performed at [facility], [address], [place], [place] [address]   Blood culture (routine x 2)     Status: None   Collection Time: 03/29/23  3:58 PM   Specimen: BLOOD  Result Value Ref Range Status   Specimen Description   Final     BLOOD BLOOD RIGHT ARM Performed at [facility], [address] [place], [place] [address]    Special Requests   Final    BOTTLES DRAWN AEROBIC AND ANAEROBIC Blood Culture adequate volume Performed at [facility], [address], [place], [place] [address]    Culture   Final    NO GROWTH 5 DAYS Performed at [facility], [address] [place], [place] [address]    Report  Status 04/03/2023 FINAL  Final  MRSA Next Gen by PCR, Nasal     Status: None   Collection Time: 03/29/23  6:49 PM   Specimen: Nasal Mucosa; Nasal Swab  Result Value Ref Range Status   MRSA by PCR Next Gen NOT DETECTED NOT DETECTED Final    Comment: (NOTE) The GeneXpert MRSA Assay (FDA approved for NASAL specimens only), is one component of a comprehensive MRSA colonization surveillance program. It is not intended to diagnose MRSA infection nor to guide or monitor treatment for MRSA infections. Test performance is not FDA approved in patients less than 52 years old. Performed at [facility], [address], [place], [place] [address]    Labs: CBC: Recent Labs  Lab 03/29/23 1042 03/30/23 0249 04/01/23 0757 04/02/23 0529  WBC 7.9 7.4 7.1 5.8  NEUTROABS  --   --  4.5 3.3  HGB 15.5* 14.6 14.1 13.4  HCT 49.4* 44.1 43.8 40.9  MCV 101.9* 95.7 97.1 95.6  PLT 205 177 167 169   Basic Metabolic Panel: Recent Labs  Lab 03/29/23 1042 03/30/23 0249 03/30/23 1540 03/31/23 0255 04/01/23 0248 04/01/23 0757 04/02/23 0529  NA 136 140 134* 135 129*  --  133*  K 4.1 2.7* 3.6 4.0 4.0  --  4.1  CL 99 95* 96* 97* 93*  --  95*  CO2 18* 26 27 25 23   --  27  GLUCOSE 172* 78 137* 78 92  --  91  BUN 16 16 17 16 16   --  17  CREATININE 1.35* 1.23* 1.21* 1.17* 1.10*  --  1.10*  CALCIUM 9.6 9.7 9.1 9.4 9.1  --  9.4  MG 2.3 1.8  --  3.3* 2.3  --  2.1  PHOS  --  2.8  --   --   --  3.4 3.5   Liver Function Tests: Recent Labs  Lab 03/29/23 1042  04/02/23 0529  AST 76* 37  ALT 134* 49*  ALKPHOS 177* 113  BILITOT 1.9* 0.8  PROT 8.3* 6.4*  ALBUMIN 4.3 3.3*   CBG: Recent Labs  Lab 04/01/23 2333 04/02/23 0414 04/02/23 0749 04/02/23 1213 04/02/23 1724  GLUCAP 90 86 90 112* 109*   Discharge time spent: greater than 30 minutes.  Signed: Marguerita Merles, DO [facility] 04/04/2023

## 2023-04-06 ENCOUNTER — Ambulatory Visit: Payer: 59

## 2023-04-13 ENCOUNTER — Other Ambulatory Visit: Payer: Self-pay | Admitting: Cardiology

## 2023-04-16 ENCOUNTER — Ambulatory Visit: Payer: 59 | Attending: Physician Assistant | Admitting: Physician Assistant

## 2023-04-16 NOTE — Progress Notes (Signed)
This encounter was created in error - please disregard.

## 2023-04-22 ENCOUNTER — Telehealth: Payer: Self-pay | Admitting: Cardiology

## 2023-04-22 MED ORDER — AMIODARONE HCL 200 MG PO TABS: 200.0000 mg | ORAL_TABLET | Freq: Every day | ORAL | 3 refills | Status: DC

## 2023-04-22 NOTE — Telephone Encounter (Signed)
Pt is requesting a refill on amiodarone. This medication was prescribed the hospital directing pt to take it for 28 days. Does pt still suppose to be taking this medication? Please address

## 2023-04-22 NOTE — Telephone Encounter (Signed)
*  STAT* If patient is at the pharmacy, call can be transferred to refill team.   1. Which medications need to be refilled? (please list name of each medication and dose if known)   amiodarone (PACERONE) 200 MG tablet   2. Would you like to learn more about the convenience, safety, & potential cost savings by using the [facility]?   3. Are you open to using the [facility] (Type [facility]. ).  4. Which pharmacy/location (including street and city if local pharmacy) is medication to be sent to?  [facility] - [place], [place] - [address]    5. Do they need a 30 day or 90 day supply?   90 day  Daughter Elease Hashimoto) stated patient is completely out of this medication.  Patient has appointment scheduled on 12/20.

## 2023-04-22 NOTE — Telephone Encounter (Signed)
Spoke with pt daughter, aware she should be on the 200 mg once daily.

## 2023-05-25 ENCOUNTER — Telehealth: Payer: Self-pay | Admitting: Endocrinology

## 2023-05-25 ENCOUNTER — Telehealth: Payer: Self-pay | Admitting: *Deleted

## 2023-05-25 MED ORDER — METHIMAZOLE 5 MG PO TABS: 5.0000 mg | ORAL_TABLET | Freq: Every day | ORAL | 1 refills | Status: DC

## 2023-05-25 NOTE — Telephone Encounter (Signed)
Done

## 2023-05-25 NOTE — Telephone Encounter (Signed)
CALLED PATIENT TO ASK ABOUT RESCHEDULING FU ON 11-29-234 DUE TO THE HOLIDAY, SPOKE WITH PATIENT'S DAUGHTER [place], PATRICIA AND SHE AGREED TO COME ON 06-19-23 @ 11 AM

## 2023-05-25 NOTE — Telephone Encounter (Signed)
MEDICATION:  methimazole methimazole (TAPAZOLE) 5 MG tablet  PHARMACY:    [facility] - [place], [place] - [address] (Ph: [phone])   HAS THE PATIENT CONTACTED THEIR PHARMACY?  Yes  IS THIS A 90 DAY SUPPLY : Yes  IS PATIENT OUT OF MEDICATION: Yes  IF NOT; HOW MUCH IS LEFT:   LAST APPOINTMENT DATE: @8 /11/2022  NEXT APPOINTMENT DATE:@1 /02/2024  DO WE HAVE YOUR PERMISSION TO LEAVE A DETAILED MESSAGE?: Yes  OTHER COMMENTS:    **Let patient know to contact pharmacy at the end of the day to make sure medication is ready. **  ** Please notify patient to allow 48-72 hours to process**  **Encourage patient to contact the pharmacy for refills or they can request refills through [facility]**

## 2023-06-01 NOTE — Progress Notes (Unsigned)
Office Visit Note  Patient: Rhonda Marshall             Date of Birth: [birth year]           MRN: [id number]             PCP: Knox Royalty, MD Referring: Elisabeth Most, FNP Visit Date: 06/15/2023   Subjective:  No chief complaint on file.   History of Present Illness: Rhonda Marshall is a 72 y.o. 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    Previous HPI 12/09/2022 Rhonda Marshall is a 72 y.o. 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  She started hydroxychloroquine after last visit without any immediate problem.  She had an eye exam with new glasses prescription but no acute problems and no concerns with hydroxychloroquine retinal toxicity risk.  Right shoulder pain is bothering her particularly worse at nighttime but does not feel this is a lot different than what she usually feels.  Hand symptoms also about the same keeps the same amount of swelling and deviation and knuckles on both sides.  Her right knee pain and swelling did improve since she started the Plaquenil.     Previous HPI 09/10/22 Rhonda Marshall is a 72 y.o. female here for follow up for seropositive RA with joint pain in bilateral shoulders hands bothering her currently. Left shoulder is slightly worse than right. Her right hand and wrist is very bad first thing in the morning with difficulty gripping but better later in the day. Knee pain is doing well. Lab tests at initial visit with highly positive RF and CCP but sed rate and CRP looked okay. 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 Cervical spine with multilevel degenerative changes and anterior calcifications present.   08/20/22 Rhonda Marshall is a 72 y.o. female here for evaluation and management of rheumatoid arthritis. She has never seen rheumatology before and has been treated by her PCP Dr. Clarene Duke with intermittent low  dose oral steroids for joint inflammation. She has had pain and stiffness in her neck and shoulders and hands going back to her 30s. Episodically experienced swelling as well and progressive deformity in her right hand. She usually experiences almost complete symptom improvement in 1 day after starting prednisone. She also sees orthopedic surgery and pain management for long term arthritis in multiple areas including her back. She had right hip femur fracture after a fall at the beach tripping on her sandal. Her worst problem lately has bene in the right knee with pain and episodic swelling. She had intraarticular steroid injection a few months ago with great improvement in knee pain.  Otherwise using as needed high-dose ibuprofen but only takes this occasionally. Medical history also significant for esophageal cancer.  She has significant cardiovascular disease history with congestive heart failure previously attempted direct-current cardioversion for A-fib that was not successful so on long-term eliquis.    No Rheumatology ROS completed.   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   Leg swelling 05/05/2019   Hyperthyroidism 12/01/2016   Dyspnea on exertion 07/30/2015   Chronic systolic CHF (congestive heart failure) (HCC) 07/30/2015   Right knee pain 05/31/2015   Rheumatoid arthritis (HCC) 07/21/2014   Smoker 07/21/2014    Anticoagulation adequate 06/05/2014   Longstanding persistent atrial fibrillation (HCC) 11/14/2010   Reflux 11/14/2010    Past Medical History:  Diagnosis Date   Anxiety    Atrial fibrillation (HCC)    newly diagnosed 10/2010   Family history of adverse reaction to anesthesia    Sister's BP drops    Hip fracture (HCC) 01/18/2022   Hypertension    Rheumatoid arthritis(714.0)    Subclinical hyperthyroidism     Family History  Problem Relation Age of Onset   Heart attack Mother 53       pacemaker age 47   Hypothyroidism Mother    Cancer Father        colon and prostate   Hypothyroidism Sister    Cancer Sister    Diabetes Brother    CAD Brother 32   Cancer Brother        Renal   Past Surgical History:  Procedure Laterality Date   Arm surgery     fracture, right arm   CARDIOVERSION N/A 08/10/2014   Procedure: CARDIOVERSION;  Surgeon: Lars Masson, MD;  Location: [facility] ENDOSCOPY;  Service: Cardiovascular;  Laterality: N/A;   CARDIOVERSION N/A 01/22/2022   Procedure: CARDIOVERSION;  Surgeon: Meriam Sprague, MD;  Location: [facility] ENDOSCOPY;  Service: Cardiovascular;  Laterality: N/A;   CATARACT EXTRACTION     DIRECT LARYNGOSCOPY N/A 09/02/2019   Procedure: DIRECT LARYNGOSCOPY WITH BIOPSY;  Surgeon: Drema Halon, MD;  Location: [facility];  Service: ENT;  Laterality: N/A;   HIP ARTHROPLASTY Right 01/19/2022   Procedure: ARTHROPLASTY BIPOLAR HIP (HEMIARTHROPLASTY);  Surgeon: Teryl Lucy, MD;  Location: [facility] ORS;  Service: Orthopedics;  Laterality: Right;   IR GASTROSTOMY TUBE MOD SED  10/17/2019   IR GASTROSTOMY TUBE REMOVAL  01/03/2020   TEE WITHOUT CARDIOVERSION N/A 01/22/2022   Procedure: TRANSESOPHAGEAL ECHOCARDIOGRAM (TEE);  Surgeon: Meriam Sprague, MD;  Location: [facility] ENDOSCOPY;  Service: Cardiovascular;  Laterality: N/A;   TUBAL LIGATION     Social History   Social History Narrative   Not on file   Immunization History  Administered Date(s)  Administered   Influenza,inj,Quad PF,6+ Mos 07/01/2017     Objective: Vital Signs: There were no vitals taken for this visit.   Physical Exam   Musculoskeletal Exam: ***  CDAI Exam: CDAI Score: -- Patient Global: --; Provider Global: -- Swollen: --; Tender: -- Joint Exam 06/15/2023   No joint exam has been documented for this visit   There is currently no information documented on the homunculus. Go to the Rheumatology activity and complete the homunculus joint exam.  Investigation: No additional findings.  Imaging: No results found.  Recent Labs: Lab Results  Component Value Date   WBC 5.8 04/02/2023   HGB 13.4 04/02/2023   PLT 169 04/02/2023   NA 133 (L) 04/02/2023   K 4.1 04/02/2023   CL 95 (L) 04/02/2023   CO2 27 04/02/2023   GLUCOSE 91 04/02/2023   BUN 17 04/02/2023   CREATININE 1.10 (H) 04/02/2023   BILITOT 0.8 04/02/2023   ALKPHOS 113 04/02/2023   AST 37 04/02/2023   ALT  49 (H) 04/02/2023   PROT 6.4 (L) 04/02/2023   ALBUMIN 3.3 (L) 04/02/2023   CALCIUM 9.4 04/02/2023   GFRAA >60 10/20/2019    Speciality Comments: PLQ Eye Exam: 10/21/2022 WNL  [facility] f/u 2 years  Procedures:  No procedures performed Allergies: Plaquenil [hydroxychloroquine], Clindamycin/lincomycin, and Latex   Assessment / Plan:     Visit Diagnoses: No diagnosis found.  ***  Orders: No orders of the defined types were placed in this encounter.  No orders of the defined types were placed in this encounter.    Follow-Up Instructions: No follow-ups on file.   Metta Clines, RT  Note - This record has been created using [employer].  Chart creation errors have been sought, but may not always  have been located. Such creation errors do not reflect on  the standard of medical care.

## 2023-06-08 ENCOUNTER — Encounter: Payer: Self-pay | Admitting: Radiation Oncology

## 2023-06-12 ENCOUNTER — Ambulatory Visit: Payer: Self-pay | Admitting: Radiation Oncology

## 2023-06-15 ENCOUNTER — Ambulatory Visit: Payer: 59 | Admitting: Internal Medicine

## 2023-06-15 DIAGNOSIS — M19011 Primary osteoarthritis, right shoulder: Secondary | ICD-10-CM

## 2023-06-15 DIAGNOSIS — M05711 Rheumatoid arthritis with rheumatoid factor of right shoulder without organ or systems involvement: Secondary | ICD-10-CM

## 2023-06-15 DIAGNOSIS — Z79899 Other long term (current) drug therapy: Secondary | ICD-10-CM

## 2023-06-15 DIAGNOSIS — G8929 Other chronic pain: Secondary | ICD-10-CM

## 2023-06-18 ENCOUNTER — Other Ambulatory Visit: Payer: Self-pay | Admitting: Cardiology

## 2023-06-18 DIAGNOSIS — I4811 Longstanding persistent atrial fibrillation: Secondary | ICD-10-CM

## 2023-06-18 NOTE — Telephone Encounter (Signed)
Prescription refill request for Eliquis received. Indication: AF Last office visit: 01/07/23  Daiva Nakayama MD Scr: 1.10 on 04/02/23  Epic Age: 72 Weight: 53kg  Based on above findings Eliquis 5mg  twice daily is the appropriate dose.  Refill approved.

## 2023-06-19 ENCOUNTER — Telehealth: Payer: Self-pay | Admitting: *Deleted

## 2023-06-19 ENCOUNTER — Encounter: Payer: Self-pay | Admitting: Radiation Oncology

## 2023-06-19 ENCOUNTER — Ambulatory Visit: Payer: 59 | Admitting: Radiation Oncology

## 2023-06-19 ENCOUNTER — Telehealth: Payer: Self-pay | Admitting: Radiation Oncology

## 2023-06-19 NOTE — Telephone Encounter (Signed)
RETURNED PATIENT'S PHONE CALL, VM FULL UNABLE TO LEAVE MESSAGE, WILL CALL LATER

## 2023-06-19 NOTE — Telephone Encounter (Addendum)
12/6 @ 9:43 am Received voicemail from patient to reschedule her appointment from today due to being very sick.  Forward voicemail to [employer] and Dr. Basilio Cairo so they are aware.

## 2023-07-02 NOTE — Progress Notes (Deleted)
Cardiology Office Note:   Date:  07/02/2023  ID:  Rhonda Marshall, DOB [birth year], MRN [id number] PCP: Knox Royalty, MD  [facility] Providers Cardiologist:  Rollene Rotunda, MD {  History of Present Illness:   Rhonda Marshall is a 72 y.o. female with a hx of persistent atrial fibrillation, chronic anticoagulation, LBBB, hypertension, chronic systolic heart failure, and RA.  She underwent cardioversion January 2016.  She was seen during hospitalization in July after a fall for preoperative risk evaluation prior to hip surgery.  Hospital course was complicated with A-fib RVR and she subsequently underwent DCCV on amiodarone but unfortunately returned to atrial fibrillation..  Echocardiogram 01/19/2022 with LVEF 30-35%, mildly reduced RV function, mild MR, and mild left atrial enlargement.  Rate control was titrated and she was discharged on 100 mg Toprol, and p.o. amiodarone taper.  GDMT titrated for acute reduction in her EF.     He presents today for follow up.  She was in the hospital for acute on chronic systolic HF in Sept.   She had diuresis.  She had low BP and was not able to tolerate Entresto or spironolactone.  The EF was   30 -35%.  She was not given SGLT2 secondary to UTIs.   She had CK IIIa.    ***   ***  Acute on Chronic Systolic CHF (congestive heart failure) (HCC) -Admitted to ICU under care of PCCM. Underwent diuresis with IV lasix and responded quite well and quickly to the diuresis -GDMT is limited given the she was taken off of empagliflozin due to urinary tract infection history and is not able to tolerate Entresto due to hypotension -Echo was done and showed an EF of 30 to 35% with left ventricule demonstrating global hypokinesis with the left ventricular diastolic parameters being indeterminate -Strict I's and O's and daily weights -Continues remain in atrial fibrillation but has better rate control on the IV amiodarone and Toprol -Unable to tolerate Entresto and  spironolactone due to hypotension -Cardiology has now recommended transition to oral amiodarone and switching her to the amiodarone taper and increased her Toprol-XL to 100 mg daily -Cardiology still does not have a clear etiology of why her left ventricular ejection fraction has declined but may be related to uncontrolled atrial fibrillation -Cardiology feels that she is euvolemic and feels that she does not need any additional diuresis for discharge given that she was hypotensive previously -Cardiology also not initiating SGLT2 inhibitor given her history of UTIs and Entresto spironolactone were stopped due to hypotension -Follow-up with cardiology outpatient setting and repeat chest x-ray in 3 to 6 weeks   Persistent Atrial Fibrillation with rapid ventricular rate and borderline hypotension, stable -Cardiology was consulted and given that she is overall asymptomatic but continues to have overall heart rates in the 100s to 130s.  Has been previously difficult to manage given her hypotension and has an inability to maintain normal sinus rhythm even with previous failure of cardioversion with and without amiodarone. -Currently amiodarone has been initiated and she is on IV amiodarone bolus and infusion and cardiology team has now transitioned her to oral amiodarone taper with 200 mg twice daily for 7 days and then just 200 mg daily after -Blood pressures has been on the lower side and has been hypotensive so she is on metoprolol succinate 50 mg p.o. daily which was increased to 100 mg daily. -Appears euvolemic to hypovolemic now and is on room air -Her Hydralazine has been discontinued -Continue to monitor on telemetry and  patient is now on anticoagulation with apixaban as well -Continue monitor heart rates in outpatient setting   ROS: ***  Studies Reviewed:    EKG:       ***  Risk Assessment/Calculations:   {Does this patient have ATRIAL FIBRILLATION?:[phone]} No BP recorded.  {Refresh  Note OR Click here to enter BP  :1}***        Physical Exam:   VS:  There were no vitals taken for this visit.   Wt Readings from Last 3 Encounters:  04/02/23 114 lb 13.8 oz (52.1 kg)  03/12/23 127 lb 13.9 oz (58 kg)  02/16/23 115 lb (52.2 kg)     GEN: Well nourished, well developed in no acute distress NECK: No JVD; No carotid bruits CARDIAC: ***RR, *** murmurs, rubs, gallops RESPIRATORY:  Clear to auscultation without rales, wheezing or rhonchi  ABDOMEN: Soft, non-tender, non-distended EXTREMITIES:  No edema; No deformity   ASSESSMENT AND PLAN:   Chronic systolic heart failure:  ***  Her blood pressure runs low.  The meds as listed elsewhere in the records are those that she tolerates.  She will not tolerate med titration.  No change in therapy.  I will check an echocardiogram in July.  She had a negative perfusion study in the past.  This is thought to be non ischemic.     Persistent atrial fibrillation: She is ***  on chronic anticoagulation.  She does not notice her atrial fibrillation.  Her rate is controlled.  No change in therapy.             Follow up ***  Signed, Rollene Rotunda, MD

## 2023-07-03 ENCOUNTER — Ambulatory Visit: Payer: 59 | Admitting: Cardiology

## 2023-07-03 DIAGNOSIS — I5022 Chronic systolic (congestive) heart failure: Secondary | ICD-10-CM

## 2023-07-03 DIAGNOSIS — I4819 Other persistent atrial fibrillation: Secondary | ICD-10-CM

## 2023-07-16 ENCOUNTER — Other Ambulatory Visit: Payer: 59

## 2023-07-16 ENCOUNTER — Telehealth: Payer: Self-pay

## 2023-07-16 DIAGNOSIS — E042 Nontoxic multinodular goiter: Secondary | ICD-10-CM

## 2023-07-16 DIAGNOSIS — E041 Nontoxic single thyroid nodule: Secondary | ICD-10-CM

## 2023-07-16 NOTE — Telephone Encounter (Signed)
 Orders Placed This Encounter  Procedures   T3, free   T4, free   TSH

## 2023-07-17 LAB — TSH: TSH: 2.57 m[IU]/L (ref 0.40–4.50)

## 2023-07-17 LAB — T3, FREE: T3, Free: 2.7 pg/mL (ref 2.3–4.2)

## 2023-07-17 LAB — T4, FREE: Free T4: 1.3 ng/dL (ref 0.8–1.8)

## 2023-07-22 ENCOUNTER — Encounter: Payer: Self-pay | Admitting: Endocrinology

## 2023-07-22 ENCOUNTER — Ambulatory Visit: Payer: 59 | Admitting: Endocrinology

## 2023-07-22 VITALS — BP 122/70 | HR 58 | Resp 20 | Ht 61.0 in | Wt 111.2 lb

## 2023-07-22 DIAGNOSIS — E041 Nontoxic single thyroid nodule: Secondary | ICD-10-CM

## 2023-07-22 DIAGNOSIS — E0590 Thyrotoxicosis, unspecified without thyrotoxic crisis or storm: Secondary | ICD-10-CM

## 2023-07-22 MED ORDER — METHIMAZOLE 5 MG PO TABS: 5.0000 mg | ORAL_TABLET | Freq: Every day | ORAL | 3 refills | Status: DC

## 2023-07-22 NOTE — Progress Notes (Signed)
 Outpatient Endocrinology Note Rhonda Raska, MD   Patient's Name: Rhonda Marshall    DOB: [birth year]    MRN: [id number]  REASON OF VISIT: Follow-up for hyperthyroidism  PCP: Joshua Francisco, MD  HISTORY OF PRESENT ILLNESS:   Rhonda Marshall is a 73 y.o. old female with past medical history as listed below is presented for a follow up for hyperthyroidism due to hyperfunctioning thyroid  nodule.   Pertinent Thyroid  History: Patient was previously seen by Dr. Von and was last time seen in August 2024.  Review of her records for thyroid  history indicates that she reportedly had subclinical hyperthyroidism in 2012 when she was admitted for new onset atrial fibrillation. Subsequently did not have any treatment or further evaluation. She has been treated for atrial fibrillation by her cardiologist and was recommended evaluation for the thyroid . The patient is a poor historian and on her initial visit she did not give any symptoms of palpitations, shakiness, feeling excessively warm and sweaty or weight loss.  She did complain of nervousness and fatigue.  However she has also had issues with anxiety and depression   Since her baseline TSH was very low along with upper normal free T3 she has been on PTU 50 mg twice a day from October 2017. Baseline thyrotropin receptor antibody was negative. With starting treatment she had less fatigue symptoms.  In August 2018 patient had radioactive iodine  uptake and scan 24-hour uptake was 31% with toxic left thyroid  adenoma probable additional hyperfunctioning nodule on the left upper pole and suppression of uptake within remaining thyroid  tissue.   She was scheduled for I-131 treatment because of the finding of a large hot nodule on the left side. However she has not been able to do this since she cannot be in isolation for a couple of days at home.  In 2018 she was off of antithyroid medication.  Free T3 got elevated 4.3.  She was treated with PTU 50 mg up to 1  to 3 tablets/day.  In April 2024 patient had, mildly low TSH with normal free T4 and T3 T, PTU was changed to methimazole  5 mg daily.    Other: She has chronic atrial fibrillation followed by cardiologist.  Interval history  Patient has been taking methimazole  5 mg daily.  Reports compliance.  Denies palpitation or heat intolerance.  No cold intolerance.  Overall feeling usual and fair.  No fever or recent illness.  No GI issues.  Recent thyroid  function test normal as follows.   Latest Reference Range & Units 07/16/23 13:53  TSH 0.40 - 4.50 mIU/L 2.57  Triiodothyronine,Free,Serum 2.3 - 4.2 pg/mL 2.7  T4,Free(Direct) 0.8 - 1.8 ng/dL 1.3    REVIEW OF SYSTEMS:  As per history of present illness.   PAST MEDICAL HISTORY: Past Medical History:  Diagnosis Date   Anxiety    Atrial fibrillation (HCC)    newly diagnosed 10/2010   Family history of adverse reaction to anesthesia    Sister's BP drops    Hip fracture (HCC) 01/18/2022   Hypertension    Rheumatoid arthritis(714.0)    Subclinical hyperthyroidism     PAST SURGICAL HISTORY: Past Surgical History:  Procedure Laterality Date   Arm surgery     fracture, right arm   CARDIOVERSION N/A 08/10/2014   Procedure: CARDIOVERSION;  Surgeon: Leim VEAR Moose, MD;  Location: [facility] ENDOSCOPY;  Service: Cardiovascular;  Laterality: N/A;   CARDIOVERSION N/A 01/22/2022   Procedure: CARDIOVERSION;  Surgeon: Hobart Powell BRAVO, MD;  Location: [facility] ENDOSCOPY;  Service: Cardiovascular;  Laterality: N/A;   CATARACT EXTRACTION     DIRECT LARYNGOSCOPY N/A 09/02/2019   Procedure: DIRECT LARYNGOSCOPY WITH BIOPSY;  Surgeon: Ethyl Lonni BRAVO, MD;  Location: [facility];  Service: ENT;  Laterality: N/A;   HIP ARTHROPLASTY Right 01/19/2022   Procedure: ARTHROPLASTY BIPOLAR HIP (HEMIARTHROPLASTY);  Surgeon: Josefina Chew, MD;  Location: [facility] ORS;  Service: Orthopedics;  Laterality: Right;   IR GASTROSTOMY TUBE MOD SED  10/17/2019   IR  GASTROSTOMY TUBE REMOVAL  01/03/2020   TEE WITHOUT CARDIOVERSION N/A 01/22/2022   Procedure: TRANSESOPHAGEAL ECHOCARDIOGRAM (TEE);  Surgeon: Hobart Powell BRAVO, MD;  Location: [facility] ENDOSCOPY;  Service: Cardiovascular;  Laterality: N/A;   TUBAL LIGATION      ALLERGIES: Allergies  Allergen Reactions   Plaquenil  [Hydroxychloroquine ] Other (See Comments)    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    Clindamycin/Lincomycin Rash   Latex Rash    Latex gloves    FAMILY HISTORY:  Family History  Problem Relation Age of Onset   Heart attack Mother 45       pacemaker age 8   Hypothyroidism Mother    Cancer Father        colon and prostate   Hypothyroidism Sister    Cancer Sister    Diabetes Brother    CAD Brother 94   Cancer Brother        Renal    SOCIAL HISTORY: Social History   Socioeconomic History   Marital status: Widowed    Spouse name: Not on file   Number of children: 1   Years of education: Not on file   Highest education level: Not on file  Occupational History   Occupation: laborer    Comment: [employer]  Tobacco Use   Smoking status: Former    Current packs/day: 0.00    Average packs/day: 0.3 packs/day for 54.2 years (13.6 ttl pk-yrs)    Types: Cigarettes    Start date: 97    Quit date: 10/2019    Years since quitting: 3.7    Passive exposure: Past   Smokeless tobacco: Never  Vaping Use   Vaping status: Never Used  Substance and Sexual Activity   Alcohol use: Not Currently    Alcohol/week: 0.0 standard drinks of alcohol   Drug use: No   Sexual activity: Not on file  Other Topics Concern   Not on file  Social History Narrative   Not on file   Social Drivers of Health   Financial Resource Strain: Not on file  Food Insecurity: Patient Declined (03/29/2023)   Hunger Vital Sign    Worried About Running Out of Food in the Last Year: Patient declined    Ran Out of Food in the Last Year: Patient declined  Transportation Needs: Patient Declined (03/29/2023)    PRAPARE - Administrator, Civil Service (Medical): Patient declined    Lack of Transportation (Non-Medical): Patient declined  Physical Activity: Not on file  Stress: Not on file  Social Connections: Not on file    MEDICATIONS:  Current Outpatient Medications  Medication Sig Dispense Refill   acetaminophen  (TYLENOL ) 500 MG tablet Take 500 mg by mouth as needed for moderate pain.     ALPRAZolam  (XANAX ) 1 MG tablet Take 1 mg by mouth 4 (four) times daily as needed for anxiety.     amiodarone  (PACERONE ) 200 MG tablet Take 1 tablet (200 mg total) by mouth daily. 90 tablet 3   docusate sodium  (COLACE) 100 MG capsule  Take 1 capsule (100 mg total) by mouth 2 (two) times daily as needed for mild constipation. 10 capsule 0   ELIQUIS  5 MG TABS tablet Take 1 tablet (5 mg total) by mouth 2 (two) times daily. 180 tablet 1   metoprolol  succinate (TOPROL -XL) 100 MG 24 hr tablet Take 1 tablet (100 mg total) by mouth daily. Take with or immediately following a meal. 30 tablet 0   Multiple Vitamin (MULTIVITAMIN WITH MINERALS) TABS tablet Take 1 tablet by mouth daily.      oxyCODONE -acetaminophen  (PERCOCET) 10-325 MG tablet Take 1 tablet by mouth every 4 (four) hours as needed for pain. 30 tablet 0   polyethylene glycol powder (GLYCOLAX /MIRALAX ) 17 GM/SCOOP powder Take 17 g by mouth daily as needed for moderate constipation. 238 g 0   Vitamin D , Ergocalciferol , (DRISDOL) 1.25 MG (50000 UNIT) CAPS capsule Take 50,000 Units by mouth once a week.     hydroxychloroquine  (PLAQUENIL ) 200 MG tablet Take 1 tablet (200 mg total) by mouth daily. (Patient not taking: Reported on 03/29/2023) 90 tablet 1   methimazole  (TAPAZOLE ) 5 MG tablet Take 1 tablet (5 mg total) by mouth daily. 90 tablet 3   No current facility-administered medications for this visit.    PHYSICAL EXAM: Vitals:   07/22/23 1605  BP: 122/70  Pulse: (!) 58  Resp: 20  SpO2: 90%  Weight: 111 lb 3.2 oz (50.4 kg)  Height: 5' 1 (1.549 m)    Body mass index is 21.01 kg/m.  Wt Readings from Last 3 Encounters:  07/22/23 111 lb 3.2 oz (50.4 kg)  04/02/23 114 lb 13.8 oz (52.1 kg)  03/12/23 127 lb 13.9 oz (58 kg)    General: Well developed, well nourished female in no apparent distress.  HEENT: AT/[place], no external lesions. Hearing intact to the spoken word Eyes: Conjunctiva clear and no icterus. No erythema or watering Neck: Trachea midline, neck supple without appreciable thyromegaly or lymphadenopathy and left palpable thyroid  nodule + Abdomen: Soft, non tender, non distended Neurologic: Alert, oriented, normal speech, deep tendon biceps reflexes normal,  no gross focal neurological deficit Extremities: No pedal pitting edema, no tremors of outstretched hands Skin: Warm, color good.  Psychiatric: Does not appear depressed or anxious  PERTINENT HISTORIC LABORATORY AND IMAGING STUDIES:  All pertinent laboratory results were reviewed. Please see HPI also for further details.   TSH  Date Value Ref Range Status  07/16/2023 2.57 0.40 - 4.50 mIU/L Final  03/29/2023 2.047 0.350 - 4.500 uIU/mL Final    Comment:    Performed by a 3rd Generation assay with a functional sensitivity of <=0.01 uIU/mL. Performed at [facility], [address], [place], [place] [address]   03/11/2023 0.912 0.350 - 4.500 uIU/mL Final    Comment:    Performed by a 3rd Generation assay with a functional sensitivity of <=0.01 uIU/mL. Performed at [facility], [address], [place], [place] [address]    T3, Total  Date Value Ref Range Status  01/18/2022 112 71 - 180 ng/dL Final    Comment:    (NOTE) Performed At: [facility] [address] [place], [place] [id number] Jennette Shorter MD Ey:[id number]   12/20/2018 158 71 - 180 ng/dL Final  97/74/7979 862 71 - 180 ng/dL Final    Lab Results  Component Value Date   FREET4 1.3 07/16/2023   FREET4 1.00 03/29/2023   FREET4 0.93 03/11/2023   T3FREE 2.7  07/16/2023   T3FREE 3.6 03/10/2023   T3FREE  3.9 02/13/2023   TSH 2.57 07/16/2023   TSH 2.047 03/29/2023   TSH 0.912 03/11/2023    Lab Results  Component Value Date   THYROTRECAB <0.50 12/24/2016    Lab Results  Component Value Date   TSH 2.57 07/16/2023   TSH 2.047 03/29/2023   TSH 0.912 03/11/2023   FREET4 1.3 07/16/2023   FREET4 1.00 03/29/2023   FREET4 0.93 03/11/2023     No results found for: TSI   No components found for: TRAB    ASSESSMENT / PLAN  1. Hyperthyroidism   2. Hot thyroid  nodule    -Patient has hyperthyroidism secondary to hyperfunctioning left thyroid  nodule.  She was treated initially with propylthiouracil .  She has been on methimazole  since April 2024.  She is currently taking methimazole  5 mg daily.  She has history of persistent atrial fibrillation as well.  She is unwilling to go for RAI I-131 treatment for hyperthyroidism, because she cannot go for isolation at home.  Will treat hyperthyroidism with antithyroid medication.  She is clinically and biochemically euthyroid.  She had normal thyroid  function test recently.  Plan: -Continue methimazole  5 mg daily. -Check thyroid  function test in 4 months prior to follow-up visit.   Diagnoses and all orders for this visit:  Hyperthyroidism -     T3, free -     T4, free -     TSH  Hot thyroid  nodule  Other orders -     methimazole  (TAPAZOLE ) 5 MG tablet; Take 1 tablet (5 mg total) by mouth daily.    DISPOSITION Follow up in clinic in 4 months suggested.  All questions answered and patient verbalized understanding of the plan.  Rhonda Paschal, MD [facility] Endocrinology [facility] [address] [place], [address] [place], [place] [address] Phone # [phone]   At least part of this note was generated using voice recognition software. Inadvertent word errors may have occurred, which were not recognized during the proofreading process.

## 2023-07-27 ENCOUNTER — Encounter: Payer: Self-pay | Admitting: Radiation Oncology

## 2023-08-03 NOTE — Progress Notes (Unsigned)
Cardiology Office Note:   Date:  08/05/2023  ID:  Rhonda Marshall, Rhonda Marshall 1951/01/01, MRN [id number] PCP: Knox Royalty, MD  [facility] Providers Cardiologist:  Rollene Rotunda, MD {  History of Present Illness:   Rhonda Marshall is a 73 y.o. female  with a hx of persistent atrial fibrillation, chronic anticoagulation, LBBB, hypertension, chronic systolic heart failure, and RA.  She underwent cardioversion January 2016.  She was seen in the hospital in July 2023 for closed hip fracture.  The hospital course was complicated with A-fib RVR and she subsequently underwent DCCV on amiodarone but unfortunately returned to atrial fibrillation..  Echocardiogram 01/19/2022 with LVEF 30-35%, mildly reduced RV function, mild MR, and mild left atrial enlargement.  Rate control was titrated and she was discharged on 100 mg Toprol, and p.o. amiodarone taper.  GDMT titrated for acute reduction in her EF.   Her most recent echocardiogram in September 2024 demonstrated EF to be 30 to 35%.  She was in the hospital at that time with acute on chronic systolic heart failure.  Of note the etiology of her cardiomyopathy was thought possibly to be persistent atrial fibrillation with rapid rate.  She had low blood pressure which precluded much in the way of med titration.  She was not put on Farxiga because of urinary tract infections.  Entresto and spironolactone had to be stopped secondary to hypotension.  Jardiance was stopped in the past as well.     She presents today for follow-up and she has done relatively well.  She does get short of breath with activities.  Her weights have been relatively stable.  She has a caregiver who takes excellent care of her.  They wear only about once a week.  She is not currently taking any diuretic.  They do try to watch the salt although occasionally they use some salt.  She is not having any new PND or orthopnea though she chronically sleeps on 3 pillows.  She is not having any new  chest pressure, neck or arm discomfort.  She has had no weight gain as mentioned and she does have some mild chronic right greater than left lower extremity edema.  She gets around slowly with a shuffling gait.  She actually looks frail for age.  ROS: As stated in the HPI and negative for all other systems.  Studies Reviewed:    EKG:   EKG Interpretation Date/Time:  Wednesday August 05 2023 15:57:07 EST Ventricular Rate:  105 PR Interval:    QRS Duration:  134 QT Interval:  324 QTC Calculation: 428 R Axis:   -32  Text Interpretation: Atrial fibrillation with rapid ventricular response Left axis deviation Left bundle branch block When compared with ECG of 11-Mar-2023 08:18, No significant change since last tracing Confirmed by Rollene Rotunda ([id number]) on 08/05/2023 4:09:50 PM    Risk Assessment/Calculations:    CHA2DS2-VASc Score = 3   This indicates a 3.2% annual risk of stroke. The patient's score is based upon: CHF History: 1 HTN History: 0 Diabetes History: 0 Stroke History: 0 Vascular Disease History: 0 Age Score: 1 Gender Score: 1   Physical Exam:   VS:  BP 122/80 (Cuff Size: Normal)   Pulse (!) 105   Ht 5\' 2"  (1.575 m)   Wt 115 lb 3.2 oz (52.3 kg)   BMI 21.07 kg/m    Wt Readings from Last 3 Encounters:  08/05/23 115 lb 3.2 oz (52.3 kg)  07/22/23 111 lb 3.2 oz (50.4 kg)  04/02/23  114 lb 13.8 oz (52.1 kg)     GEN: Well nourished, well developed in no acute distress NECK: No JVD; No carotid bruits CARDIAC: Irregular RR, no murmurs, rubs, gallops RESPIRATORY:  Clear to auscultation without rales, wheezing or rhonchi , irregular ABDOMEN: Soft, non-tender, non-distended EXTREMITIES:  bilateral ankle  edema; No deformity   ASSESSMENT AND PLAN:    Chronic systolic HF: The patient has acute on chronic systolic heart failure and I think she is a little bit volume up today.  I going to give her Lasix 20 mg a day for 3 days.  Etiology of her cardiomyopathy is not  entirely clear but it is thought possibly to be rate related.  I am going to increase her metoprolol to 100 mg in the morning and see if she can tolerate an extra 50 in the evening.  We talked about salt and fluid restriction.  Unfortunately med titration will be limited by her hypotension.  Persistent atrial fib:     I am going to go ahead and continue the amiodarone for rate control only.  She tolerates anticoagulation.  She tolerates anticoagulation.     Follow up with me or APP in 3 months.   Signed, Rollene Rotunda, MD

## 2023-08-05 ENCOUNTER — Ambulatory Visit: Payer: 59 | Attending: Cardiology | Admitting: Cardiology

## 2023-08-05 ENCOUNTER — Encounter: Payer: Self-pay | Admitting: Cardiology

## 2023-08-05 VITALS — BP 122/80 | HR 105 | Ht 62.0 in | Wt 115.2 lb

## 2023-08-05 DIAGNOSIS — I4819 Other persistent atrial fibrillation: Secondary | ICD-10-CM | POA: Diagnosis not present

## 2023-08-05 DIAGNOSIS — I5022 Chronic systolic (congestive) heart failure: Secondary | ICD-10-CM | POA: Diagnosis not present

## 2023-08-05 MED ORDER — METOPROLOL SUCCINATE ER 100 MG PO TB24: 100.0000 mg | ORAL_TABLET | Freq: Every day | ORAL | 11 refills | Status: DC

## 2023-08-05 MED ORDER — FUROSEMIDE 20 MG PO TABS: 20.0000 mg | ORAL_TABLET | Freq: Every day | ORAL | 1 refills | Status: DC

## 2023-08-05 NOTE — Patient Instructions (Signed)
Medication Instructions:  Start Furosemide 20 mg by mouth for 3 days only. Change metoprolol dose to one tablet (100) mg in the morning and 1/2 tablet (50) mg in the evening. Script sent *If you need a refill on your cardiac medications before your next appointment, please call your pharmacy*   Follow-Up: At [facility], you and your health needs are our priority.  As part of our continuing mission to provide you with exceptional heart care, we have created designated Provider Care Teams.  These Care Teams include your primary Cardiologist (physician) and Advanced Practice Providers (APPs -  Physician Assistants and Nurse Practitioners) who all work together to provide you with the care you need, when you need it.  We recommend signing up for the patient portal called "MyChart".  Sign up information is provided on this After Visit Summary.  MyChart is used to connect with patients for Virtual Visits (Telemedicine).  Patients are able to view lab/test results, encounter notes, upcoming appointments, etc.  Non-urgent messages can be sent to your provider as well.   To learn more about what you can do with MyChart, go to [email or web address].    Your next appointment:   2 month(s)  Provider:   First Available.

## 2023-08-13 ENCOUNTER — Encounter: Payer: Self-pay | Admitting: Cardiology

## 2023-08-13 NOTE — Telephone Encounter (Signed)
Attempted to call patient, no answer left message requesting a call back.

## 2023-08-14 NOTE — Telephone Encounter (Signed)
Patient identification verified by 2 forms. Shade Flood, RN     Called and spoke to patient's daughter patricia.  - Daughter is concerned patient may have fallen or has a brake.  - Lasix did help reduce swelling in RT leg and ankle but both are still quite large.  - Patient said legs began to be extremely itchy once she stopped the dose of lasix. RT leg now has a rash with sores and redness. Patient's daughter described rash appearance as "streaky".   Patient denies:  - SOB, chest pain, change in orientation, nausea/vomiting.              Interventions/Plan: - Patient daughter will send pics of leg via mychart for DOD to review.  - Recommended patient be seen at urgent care for rash but if concern for a fall patient would need to go to ER for xrays.    Reviewed ED warning signs/precautions  Patient and daughter agree with plan, no questions at this time

## 2023-08-15 ENCOUNTER — Other Ambulatory Visit: Payer: Self-pay

## 2023-08-15 ENCOUNTER — Emergency Department (HOSPITAL_COMMUNITY): Payer: 59

## 2023-08-15 ENCOUNTER — Encounter (HOSPITAL_COMMUNITY): Payer: Self-pay | Admitting: *Deleted

## 2023-08-15 ENCOUNTER — Inpatient Hospital Stay (HOSPITAL_COMMUNITY)
Admission: EM | Admit: 2023-08-15 | Discharge: 2023-08-19 | DRG: 291 | Disposition: A | Discharge status: Home / Self Care | Payer: 59 | Attending: Internal Medicine | Admitting: Internal Medicine

## 2023-08-15 DIAGNOSIS — L89892 Pressure ulcer of other site, stage 2: Secondary | ICD-10-CM | POA: Diagnosis present

## 2023-08-15 DIAGNOSIS — Z96641 Presence of right artificial hip joint: Secondary | ICD-10-CM | POA: Diagnosis present

## 2023-08-15 DIAGNOSIS — Z888 Allergy status to other drugs, medicaments and biological substances status: Secondary | ICD-10-CM

## 2023-08-15 DIAGNOSIS — Z91040 Latex allergy status: Secondary | ICD-10-CM

## 2023-08-15 DIAGNOSIS — R7303 Prediabetes: Secondary | ICD-10-CM | POA: Diagnosis present

## 2023-08-15 DIAGNOSIS — F419 Anxiety disorder, unspecified: Secondary | ICD-10-CM | POA: Diagnosis present

## 2023-08-15 DIAGNOSIS — L8990 Pressure ulcer of unspecified site, unspecified stage: Secondary | ICD-10-CM | POA: Insufficient documentation

## 2023-08-15 DIAGNOSIS — I5023 Acute on chronic systolic (congestive) heart failure: Secondary | ICD-10-CM | POA: Diagnosis present

## 2023-08-15 DIAGNOSIS — I130 Hypertensive heart and chronic kidney disease with heart failure and stage 1 through stage 4 chronic kidney disease, or unspecified chronic kidney disease: Principal | ICD-10-CM | POA: Diagnosis present

## 2023-08-15 DIAGNOSIS — E0590 Thyrotoxicosis, unspecified without thyrotoxic crisis or storm: Secondary | ICD-10-CM | POA: Diagnosis present

## 2023-08-15 DIAGNOSIS — I5043 Acute on chronic combined systolic (congestive) and diastolic (congestive) heart failure: Secondary | ICD-10-CM

## 2023-08-15 DIAGNOSIS — G894 Chronic pain syndrome: Secondary | ICD-10-CM | POA: Diagnosis present

## 2023-08-15 DIAGNOSIS — R7989 Other specified abnormal findings of blood chemistry: Secondary | ICD-10-CM | POA: Diagnosis present

## 2023-08-15 DIAGNOSIS — I2489 Other forms of acute ischemic heart disease: Secondary | ICD-10-CM | POA: Diagnosis present

## 2023-08-15 DIAGNOSIS — Z881 Allergy status to other antibiotic agents status: Secondary | ICD-10-CM

## 2023-08-15 DIAGNOSIS — Z8249 Family history of ischemic heart disease and other diseases of the circulatory system: Secondary | ICD-10-CM

## 2023-08-15 DIAGNOSIS — E871 Hypo-osmolality and hyponatremia: Secondary | ICD-10-CM | POA: Diagnosis present

## 2023-08-15 DIAGNOSIS — E876 Hypokalemia: Secondary | ICD-10-CM | POA: Diagnosis present

## 2023-08-15 DIAGNOSIS — M069 Rheumatoid arthritis, unspecified: Secondary | ICD-10-CM | POA: Diagnosis present

## 2023-08-15 DIAGNOSIS — I509 Heart failure, unspecified: Secondary | ICD-10-CM | POA: Diagnosis not present

## 2023-08-15 DIAGNOSIS — L89890 Pressure ulcer of other site, unstageable: Secondary | ICD-10-CM | POA: Diagnosis present

## 2023-08-15 DIAGNOSIS — Z1152 Encounter for screening for COVID-19: Secondary | ICD-10-CM

## 2023-08-15 DIAGNOSIS — N183 Chronic kidney disease, stage 3 unspecified: Secondary | ICD-10-CM | POA: Insufficient documentation

## 2023-08-15 DIAGNOSIS — I4821 Permanent atrial fibrillation: Secondary | ICD-10-CM | POA: Diagnosis present

## 2023-08-15 DIAGNOSIS — Z79899 Other long term (current) drug therapy: Secondary | ICD-10-CM

## 2023-08-15 DIAGNOSIS — J9601 Acute respiratory failure with hypoxia: Principal | ICD-10-CM | POA: Diagnosis present

## 2023-08-15 DIAGNOSIS — I4811 Longstanding persistent atrial fibrillation: Secondary | ICD-10-CM | POA: Diagnosis present

## 2023-08-15 DIAGNOSIS — Z833 Family history of diabetes mellitus: Secondary | ICD-10-CM

## 2023-08-15 DIAGNOSIS — Z7901 Long term (current) use of anticoagulants: Secondary | ICD-10-CM

## 2023-08-15 DIAGNOSIS — I4891 Unspecified atrial fibrillation: Secondary | ICD-10-CM | POA: Diagnosis present

## 2023-08-15 DIAGNOSIS — N1831 Chronic kidney disease, stage 3a: Secondary | ICD-10-CM | POA: Diagnosis present

## 2023-08-15 DIAGNOSIS — Z87891 Personal history of nicotine dependence: Secondary | ICD-10-CM

## 2023-08-15 NOTE — ED Triage Notes (Signed)
Pt from home for sob x 2 days, denies fever, hx of CHF afib. On arrival sats 83% on room air, placed on 2 L with slight improvement to 88%

## 2023-08-16 ENCOUNTER — Encounter (HOSPITAL_COMMUNITY): Payer: Self-pay | Admitting: Internal Medicine

## 2023-08-16 DIAGNOSIS — Z8249 Family history of ischemic heart disease and other diseases of the circulatory system: Secondary | ICD-10-CM | POA: Diagnosis not present

## 2023-08-16 DIAGNOSIS — J9601 Acute respiratory failure with hypoxia: Principal | ICD-10-CM

## 2023-08-16 DIAGNOSIS — Z87891 Personal history of nicotine dependence: Secondary | ICD-10-CM | POA: Diagnosis not present

## 2023-08-16 DIAGNOSIS — Z833 Family history of diabetes mellitus: Secondary | ICD-10-CM | POA: Diagnosis not present

## 2023-08-16 DIAGNOSIS — I4821 Permanent atrial fibrillation: Secondary | ICD-10-CM | POA: Diagnosis present

## 2023-08-16 DIAGNOSIS — I130 Hypertensive heart and chronic kidney disease with heart failure and stage 1 through stage 4 chronic kidney disease, or unspecified chronic kidney disease: Secondary | ICD-10-CM | POA: Diagnosis present

## 2023-08-16 DIAGNOSIS — N1831 Chronic kidney disease, stage 3a: Secondary | ICD-10-CM | POA: Diagnosis present

## 2023-08-16 DIAGNOSIS — I4811 Longstanding persistent atrial fibrillation: Secondary | ICD-10-CM | POA: Diagnosis not present

## 2023-08-16 DIAGNOSIS — Z79899 Other long term (current) drug therapy: Secondary | ICD-10-CM | POA: Diagnosis not present

## 2023-08-16 DIAGNOSIS — N183 Chronic kidney disease, stage 3 unspecified: Secondary | ICD-10-CM | POA: Insufficient documentation

## 2023-08-16 DIAGNOSIS — E876 Hypokalemia: Secondary | ICD-10-CM | POA: Diagnosis present

## 2023-08-16 DIAGNOSIS — L89890 Pressure ulcer of other site, unstageable: Secondary | ICD-10-CM | POA: Diagnosis present

## 2023-08-16 DIAGNOSIS — F419 Anxiety disorder, unspecified: Secondary | ICD-10-CM | POA: Diagnosis present

## 2023-08-16 DIAGNOSIS — Z91040 Latex allergy status: Secondary | ICD-10-CM | POA: Diagnosis not present

## 2023-08-16 DIAGNOSIS — I509 Heart failure, unspecified: Secondary | ICD-10-CM | POA: Insufficient documentation

## 2023-08-16 DIAGNOSIS — Z1152 Encounter for screening for COVID-19: Secondary | ICD-10-CM | POA: Diagnosis not present

## 2023-08-16 DIAGNOSIS — Z7901 Long term (current) use of anticoagulants: Secondary | ICD-10-CM | POA: Diagnosis not present

## 2023-08-16 DIAGNOSIS — M069 Rheumatoid arthritis, unspecified: Secondary | ICD-10-CM | POA: Diagnosis present

## 2023-08-16 DIAGNOSIS — I5023 Acute on chronic systolic (congestive) heart failure: Secondary | ICD-10-CM | POA: Diagnosis present

## 2023-08-16 DIAGNOSIS — G894 Chronic pain syndrome: Secondary | ICD-10-CM | POA: Diagnosis present

## 2023-08-16 DIAGNOSIS — E871 Hypo-osmolality and hyponatremia: Secondary | ICD-10-CM | POA: Diagnosis present

## 2023-08-16 DIAGNOSIS — I4891 Unspecified atrial fibrillation: Secondary | ICD-10-CM | POA: Diagnosis not present

## 2023-08-16 DIAGNOSIS — L89892 Pressure ulcer of other site, stage 2: Secondary | ICD-10-CM | POA: Diagnosis present

## 2023-08-16 DIAGNOSIS — I2489 Other forms of acute ischemic heart disease: Secondary | ICD-10-CM | POA: Diagnosis present

## 2023-08-16 DIAGNOSIS — E0590 Thyrotoxicosis, unspecified without thyrotoxic crisis or storm: Secondary | ICD-10-CM | POA: Diagnosis present

## 2023-08-16 DIAGNOSIS — Z96641 Presence of right artificial hip joint: Secondary | ICD-10-CM | POA: Diagnosis present

## 2023-08-16 DIAGNOSIS — R7303 Prediabetes: Secondary | ICD-10-CM | POA: Diagnosis present

## 2023-08-16 LAB — CBC WITH DIFFERENTIAL/PLATELET
Abs Immature Granulocytes: 0.08 10*3/uL — ABNORMAL HIGH (ref 0.00–0.07)
Abs Immature Granulocytes: 0.04 10*3/uL (ref 0.00–0.07)
Basophils Absolute: 0 10*3/uL (ref 0.0–0.1)
Basophils Absolute: 0 10*3/uL (ref 0.0–0.1)
Basophils Relative: 0 %
Basophils Relative: 0 %
Eosinophils Absolute: 0 10*3/uL (ref 0.0–0.5)
Eosinophils Absolute: 0 10*3/uL (ref 0.0–0.5)
Eosinophils Relative: 0 %
Eosinophils Relative: 0 %
HCT: 47.2 % — ABNORMAL HIGH (ref 36.0–46.0)
HCT: 43 % (ref 36.0–46.0)
Hemoglobin: 15 g/dL (ref 12.0–15.0)
Hemoglobin: 13.6 g/dL (ref 12.0–15.0)
Immature Granulocytes: 1 %
Immature Granulocytes: 1 %
Lymphocytes Relative: 7 %
Lymphocytes Relative: 6 %
Lymphs Abs: 0.9 10*3/uL (ref 0.7–4.0)
Lymphs Abs: 0.5 10*3/uL — ABNORMAL LOW (ref 0.7–4.0)
MCH: 29.6 pg (ref 26.0–34.0)
MCH: 28.7 pg (ref 26.0–34.0)
MCHC: 31.8 g/dL (ref 30.0–36.0)
MCHC: 31.6 g/dL (ref 30.0–36.0)
MCV: 93.3 fL (ref 80.0–100.0)
MCV: 90.7 fL (ref 80.0–100.0)
Monocytes Absolute: 0.5 10*3/uL (ref 0.1–1.0)
Monocytes Absolute: 0.4 10*3/uL (ref 0.1–1.0)
Monocytes Relative: 4 %
Monocytes Relative: 5 %
Neutro Abs: 10.7 10*3/uL — ABNORMAL HIGH (ref 1.7–7.7)
Neutro Abs: 7.1 10*3/uL (ref 1.7–7.7)
Neutrophils Relative %: 88 %
Neutrophils Relative %: 88 %
Platelets: 212 10*3/uL (ref 150–400)
Platelets: 189 10*3/uL (ref 150–400)
RBC: 5.06 MIL/uL (ref 3.87–5.11)
RBC: 4.74 MIL/uL (ref 3.87–5.11)
RDW: 19.8 % — ABNORMAL HIGH (ref 11.5–15.5)
RDW: 18.7 % — ABNORMAL HIGH (ref 11.5–15.5)
WBC: 12.2 10*3/uL — ABNORMAL HIGH (ref 4.0–10.5)
WBC: 8.1 10*3/uL (ref 4.0–10.5)
nRBC: 0 % (ref 0.0–0.2)
nRBC: 0 % (ref 0.0–0.2)

## 2023-08-16 LAB — BRAIN NATRIURETIC PEPTIDE: B Natriuretic Peptide: >4500 pg/mL — ABNORMAL HIGH (ref 0.0–100.0)

## 2023-08-16 LAB — COMPREHENSIVE METABOLIC PANEL
ALT: 28 U/L (ref 0–44)
ALT: 26 U/L (ref 0–44)
AST: 48 U/L — ABNORMAL HIGH (ref 15–41)
AST: 43 U/L — ABNORMAL HIGH (ref 15–41)
Albumin: 3.2 g/dL — ABNORMAL LOW (ref 3.5–5.0)
Albumin: 2.9 g/dL — ABNORMAL LOW (ref 3.5–5.0)
Alkaline Phosphatase: 157 U/L — ABNORMAL HIGH (ref 38–126)
Alkaline Phosphatase: 135 U/L — ABNORMAL HIGH (ref 38–126)
Anion gap: 14 (ref 5–15)
Anion gap: 11 (ref 5–15)
BUN: 28 mg/dL — ABNORMAL HIGH (ref 8–23)
BUN: 27 mg/dL — ABNORMAL HIGH (ref 8–23)
CO2: 20 mmol/L — ABNORMAL LOW (ref 22–32)
CO2: 29 mmol/L (ref 22–32)
Calcium: 9.3 mg/dL (ref 8.9–10.3)
Calcium: 9 mg/dL (ref 8.9–10.3)
Chloride: 105 mmol/L (ref 98–111)
Chloride: 99 mmol/L (ref 98–111)
Creatinine, Ser: 1.18 mg/dL — ABNORMAL HIGH (ref 0.44–1.00)
Creatinine, Ser: 1.14 mg/dL — ABNORMAL HIGH (ref 0.44–1.00)
GFR, Estimated: 49 mL/min — ABNORMAL LOW (ref >60)
GFR, Estimated: 51 mL/min — ABNORMAL LOW (ref >60)
Glucose, Bld: 204 mg/dL — ABNORMAL HIGH (ref 70–99)
Glucose, Bld: 111 mg/dL — ABNORMAL HIGH (ref 70–99)
Potassium: 3.6 mmol/L (ref 3.5–5.1)
Potassium: 2.9 mmol/L — ABNORMAL LOW (ref 3.5–5.1)
Sodium: 139 mmol/L (ref 135–145)
Sodium: 139 mmol/L (ref 135–145)
Total Bilirubin: 1.1 mg/dL (ref 0.0–1.2)
Total Bilirubin: 1.5 mg/dL — ABNORMAL HIGH (ref 0.0–1.2)
Total Protein: 7.3 g/dL (ref 6.5–8.1)
Total Protein: 6.6 g/dL (ref 6.5–8.1)

## 2023-08-16 LAB — RESP PANEL BY RT-PCR (RSV, FLU A&B, COVID)  RVPGX2
Influenza A by PCR: NEGATIVE
Influenza B by PCR: NEGATIVE
Resp Syncytial Virus by PCR: NEGATIVE
SARS Coronavirus 2 by RT PCR: NEGATIVE

## 2023-08-16 LAB — MAGNESIUM: Magnesium: 1.8 mg/dL (ref 1.7–2.4)

## 2023-08-16 LAB — TROPONIN I (HIGH SENSITIVITY)
Troponin I (High Sensitivity): 94 ng/L — ABNORMAL HIGH (ref <18)
Troponin I (High Sensitivity): 87 ng/L — ABNORMAL HIGH (ref <18)
Troponin I (High Sensitivity): 121 ng/L (ref <18)
Troponin I (High Sensitivity): 121 ng/L (ref <18)

## 2023-08-16 LAB — HEMOGLOBIN A1C
Hgb A1c MFr Bld: 6.1 % — ABNORMAL HIGH (ref 4.8–5.6)
Mean Plasma Glucose: 128.37 mg/dL

## 2023-08-16 LAB — BLOOD GAS, VENOUS
Acid-base deficit: 1.5 mmol/L (ref 0.0–2.0)
Bicarbonate: 24.3 mmol/L (ref 20.0–28.0)
O2 Saturation: 49.2 %
Patient temperature: 37
pCO2, Ven: 44 mm[Hg] (ref 44–60)
pH, Ven: 7.35 (ref 7.25–7.43)
pO2, Ven: 34 mm[Hg] (ref 32–45)

## 2023-08-16 LAB — TSH: TSH: 2.24 u[IU]/mL (ref 0.350–4.500)

## 2023-08-16 LAB — MRSA NEXT GEN BY PCR, NASAL: MRSA by PCR Next Gen: NOT DETECTED

## 2023-08-16 MED ORDER — OXYCODONE-ACETAMINOPHEN 10-325 MG PO TABS: 1.0000 | ORAL_TABLET | Freq: Four times a day (QID) | ORAL | Status: DC | PRN

## 2023-08-16 MED ORDER — POTASSIUM CHLORIDE 10 MEQ/100ML IV SOLN
10.0000 meq | INTRAVENOUS | Status: AC
  Administered 2023-08-16 (×4): 10 meq via INTRAVENOUS
  Filled 2023-08-16 (×4): qty 100

## 2023-08-16 MED ORDER — OXYCODONE HCL 5 MG PO TABS
5.0000 mg | ORAL_TABLET | Freq: Four times a day (QID) | ORAL | Status: DC | PRN
  Filled 2023-08-16 (×3): qty 1

## 2023-08-16 MED ORDER — CHLORHEXIDINE GLUCONATE CLOTH 2 % EX PADS
6.0000 | MEDICATED_PAD | Freq: Every day | CUTANEOUS | Status: DC
  Administered 2023-08-16 – 2023-08-19 (×4): 6 via TOPICAL

## 2023-08-16 MED ORDER — METOPROLOL SUCCINATE ER 50 MG PO TB24: 100.0000 mg | ORAL_TABLET | Freq: Every day | ORAL | Status: DC

## 2023-08-16 MED ORDER — METOPROLOL SUCCINATE ER 25 MG PO TB24
50.0000 mg | ORAL_TABLET | Freq: Every day | ORAL | Status: DC
  Administered 2023-08-16 – 2023-08-17 (×2): 50 mg via ORAL
  Filled 2023-08-16 (×2): qty 2

## 2023-08-16 MED ORDER — METHIMAZOLE 5 MG PO TABS
5.0000 mg | ORAL_TABLET | Freq: Every day | ORAL | Status: DC
  Administered 2023-08-16 – 2023-08-19 (×4): 5 mg via ORAL
  Filled 2023-08-16 (×4): qty 1

## 2023-08-16 MED ORDER — OXYCODONE-ACETAMINOPHEN 5-325 MG PO TABS
1.0000 | ORAL_TABLET | Freq: Four times a day (QID) | ORAL | Status: DC | PRN
  Administered 2023-08-16: 1 via ORAL
  Filled 2023-08-16: qty 1

## 2023-08-16 MED ORDER — FUROSEMIDE 10 MG/ML IJ SOLN
80.0000 mg | Freq: Once | INTRAMUSCULAR | Status: AC
  Administered 2023-08-16: 80 mg via INTRAVENOUS
  Filled 2023-08-16: qty 8

## 2023-08-16 MED ORDER — AMIODARONE HCL 200 MG PO TABS
200.0000 mg | ORAL_TABLET | Freq: Every day | ORAL | Status: DC
  Administered 2023-08-16 – 2023-08-19 (×4): 200 mg via ORAL
  Filled 2023-08-16 (×5): qty 1

## 2023-08-16 MED ORDER — OXYCODONE HCL 5 MG PO TABS
5.0000 mg | ORAL_TABLET | Freq: Four times a day (QID) | ORAL | Status: DC | PRN
  Administered 2023-08-16 – 2023-08-19 (×6): 5 mg via ORAL
  Filled 2023-08-16 (×3): qty 1

## 2023-08-16 MED ORDER — ALPRAZOLAM 0.5 MG PO TABS
1.0000 mg | ORAL_TABLET | Freq: Four times a day (QID) | ORAL | Status: DC | PRN
  Administered 2023-08-16 – 2023-08-19 (×5): 1 mg via ORAL
  Filled 2023-08-16 (×6): qty 2

## 2023-08-16 MED ORDER — MAGNESIUM SULFATE 2 GM/50ML IV SOLN
2.0000 g | Freq: Once | INTRAVENOUS | Status: AC
  Administered 2023-08-16: 2 g via INTRAVENOUS
  Filled 2023-08-16: qty 50

## 2023-08-16 MED ORDER — OXYCODONE-ACETAMINOPHEN 5-325 MG PO TABS
1.0000 | ORAL_TABLET | Freq: Four times a day (QID) | ORAL | Status: DC | PRN
  Administered 2023-08-16 – 2023-08-18 (×6): 1 via ORAL
  Filled 2023-08-16 (×6): qty 1

## 2023-08-16 MED ORDER — ORAL CARE MOUTH RINSE: 15.0000 mL | OROMUCOSAL | Status: DC | PRN

## 2023-08-16 MED ORDER — METOPROLOL SUCCINATE ER 25 MG PO TB24
100.0000 mg | ORAL_TABLET | Freq: Every day | ORAL | Status: DC
  Administered 2023-08-16 – 2023-08-17 (×2): 100 mg via ORAL
  Filled 2023-08-16 (×3): qty 4

## 2023-08-16 MED ORDER — DOCUSATE SODIUM 100 MG PO CAPS: 100.0000 mg | ORAL_CAPSULE | Freq: Two times a day (BID) | ORAL | Status: DC | PRN

## 2023-08-16 MED ORDER — FUROSEMIDE 10 MG/ML IJ SOLN
40.0000 mg | Freq: Two times a day (BID) | INTRAMUSCULAR | Status: DC
  Administered 2023-08-16 – 2023-08-17 (×3): 40 mg via INTRAVENOUS
  Filled 2023-08-16 (×6): qty 4

## 2023-08-16 MED ORDER — APIXABAN 5 MG PO TABS
5.0000 mg | ORAL_TABLET | Freq: Two times a day (BID) | ORAL | Status: DC
Start: 2023-08-16 — End: 2023-08-19
  Administered 2023-08-16 – 2023-08-19 (×7): 5 mg via ORAL
  Filled 2023-08-16 (×7): qty 1

## 2023-08-16 NOTE — ED Provider Notes (Signed)
[place] EMERGENCY DEPARTMENT AT [facility] Provider Note   CSN: [id number] Arrival date & time: 08/15/23  2327     History  Chief Complaint  Patient presents with   Respiratory Distress    Rhonda Marshall is a 73 y.o. female.  Patient with history of congestive heart failure and chronic atrial fibrillation presents to the emergency department for evaluation of shortness of breath.  Patient had increased swelling of her legs earlier this week and was given Lasix by her cardiologist.  Leg swelling went down but now she is experiencing increasing shortness of breath.  Patient found to be hypoxic in triage.       Home Medications Prior to Admission medications   Medication Sig Start Date End Date Taking? Authorizing Provider  acetaminophen (TYLENOL) 500 MG tablet Take 500 mg by mouth as needed for moderate pain.    [provider]  ALPRAZolam Prudy Feeler) 1 MG tablet Take 1 mg by mouth 4 (four) times daily as needed for anxiety.    [provider]  amiodarone (PACERONE) 200 MG tablet Take 1 tablet (200 mg total) by mouth daily. 04/22/23   Rollene Rotunda, MD  docusate sodium (COLACE) 100 MG capsule Take 1 capsule (100 mg total) by mouth 2 (two) times daily as needed for mild constipation. 04/02/23   Sheikh, Omair Latif, DO  ELIQUIS 5 MG TABS tablet Take 1 tablet (5 mg total) by mouth 2 (two) times daily. 06/18/23   Rollene Rotunda, MD  furosemide (LASIX) 20 MG tablet Take 1 tablet (20 mg total) by mouth daily for 3 days. For 3 days. 08/05/23 08/08/23  Rollene Rotunda, MD  methimazole (TAPAZOLE) 5 MG tablet Take 1 tablet (5 mg total) by mouth daily. 07/22/23   Thapa, [place], MD  metoprolol succinate (TOPROL-XL) 100 MG 24 hr tablet Take 1 tablet (100 mg total) by mouth daily. In the morning and 1/2 tablet (50) mg in the evening. 08/05/23   Rollene Rotunda, MD  Multiple Vitamin (MULTIVITAMIN WITH MINERALS) TABS tablet Take 1 tablet by mouth daily.     [provider]  oxyCODONE-acetaminophen (PERCOCET) 10-325 MG tablet Take 1 tablet by mouth every 4 (four) hours as needed for pain. 01/24/22   Armida Sans, PA-C  Vitamin D, Ergocalciferol, (DRISDOL) 1.25 MG (50000 UNIT) CAPS capsule Take 50,000 Units by mouth once a week. 12/28/21   [provider]      Allergies    Plaquenil [hydroxychloroquine], Clindamycin/lincomycin, and Latex    Review of Systems   Review of Systems  Physical Exam Updated Vital Signs BP (!) 131/118   Pulse (!) 118   Resp (!) 33   SpO2 98%  Physical Exam Vitals and nursing note reviewed.  Constitutional:      General: She is not in acute distress.    Appearance: She is well-developed.  HENT:     Head: Normocephalic and atraumatic.     Mouth/Throat:     Mouth: Mucous membranes are moist.  Eyes:     General: Vision grossly intact. Gaze aligned appropriately.     Extraocular Movements: Extraocular movements intact.     Conjunctiva/sclera: Conjunctivae normal.  Cardiovascular:     Rate and Rhythm: Regular rhythm. Tachycardia present.     Pulses: Normal pulses.     Heart sounds: Normal heart sounds, S1 normal and S2 normal. No murmur heard.    No friction rub. No gallop.  Pulmonary:     Effort: Pulmonary effort is normal. No respiratory  distress.     Breath sounds: Rales present.  Abdominal:     General: Bowel sounds are normal.     Palpations: Abdomen is soft.     Tenderness: There is no abdominal tenderness. There is no guarding or rebound.     Hernia: No hernia is present.  Musculoskeletal:        General: No swelling.     Cervical back: Full passive range of motion without pain, normal range of motion and neck supple. No spinous process tenderness or muscular tenderness. Normal range of motion.     Right lower leg: Edema present.     Left lower leg: Edema present.  Skin:    General: Skin is warm and dry.     Capillary Refill: Capillary refill takes less than 2 seconds.     Findings: No  ecchymosis, erythema, rash or wound.  Neurological:     General: No focal deficit present.     Mental Status: She is alert and oriented to person, place, and time.     GCS: GCS eye subscore is 4. GCS verbal subscore is 5. GCS motor subscore is 6.     Cranial Nerves: Cranial nerves 2-12 are intact.     Sensory: Sensation is intact.     Motor: Motor function is intact.     Coordination: Coordination is intact.  Psychiatric:        Attention and Perception: Attention normal.        Mood and Affect: Mood normal.        Speech: Speech normal.        Behavior: Behavior normal.     ED Results / Procedures / Treatments   Labs (all labs ordered are listed, but only abnormal results are displayed) Labs Reviewed  CBC WITH DIFFERENTIAL/PLATELET - Abnormal; Notable for the following components:      Result Value   WBC 12.2 (*)    HCT 47.2 (*)    RDW 19.8 (*)    Neutro Abs 10.7 (*)    Abs Immature Granulocytes 0.08 (*)    All other components within normal limits  COMPREHENSIVE METABOLIC PANEL - Abnormal; Notable for the following components:   CO2 20 (*)    Glucose, Bld 204 (*)    BUN 28 (*)    Creatinine, Ser 1.18 (*)    Albumin 3.2 (*)    AST 48 (*)    Alkaline Phosphatase 157 (*)    GFR, Estimated 49 (*)    All other components within normal limits  BRAIN NATRIURETIC PEPTIDE - Abnormal; Notable for the following components:   B Natriuretic Peptide >4,500.0 (*)    All other components within normal limits  TROPONIN I (HIGH SENSITIVITY) - Abnormal; Notable for the following components:   Troponin I (High Sensitivity) 94 (*)    All other components within normal limits  RESP PANEL BY RT-PCR (RSV, FLU A&B, COVID)  [id number]  BLOOD GAS, VENOUS  TROPONIN I (HIGH SENSITIVITY)    EKG EKG Interpretation Date/Time:  Saturday August 15 2023 23:42:42 EST Ventricular Rate:  138 PR Interval:    QRS Duration:  132 QT Interval:  321 QTC Calculation: 496 R Axis:   246  Text  Interpretation: Atrial fibrillation Nonspecific IVCD with LAD No significant change since jan 2025 Confirmed by Gilda Crease [phone]) on 08/16/2023 12:24:20 AM  Radiology DG Chest Port 1 View Result Date: 08/15/2023 CLINICAL DATA:  Shortness of breath EXAM: PORTABLE CHEST 1 VIEW COMPARISON:  04/02/2023  FINDINGS: Moderate cardiomegaly with bilateral interstitial opacities likely mild pulmonary edema. Small left pleural effusion. IMPRESSION: Moderate cardiomegaly with mild pulmonary edema and small left pleural effusion. Electronically Signed   By: Deatra Robinson M.D.   On: 08/15/2023 23:57    Procedures Procedures    Medications Ordered in ED Medications  furosemide (LASIX) injection 80 mg (80 mg Intravenous Given 08/16/23 0033)    ED Course/ Medical Decision Making/ A&P                                 Medical Decision Making Amount and/or Complexity of Data Reviewed External Data Reviewed: ECG and notes. Labs: ordered. Decision-making details documented in ED Course. Radiology: ordered and independent interpretation performed. Decision-making details documented in ED Course. ECG/medicine tests: ordered and independent interpretation performed. Decision-making details documented in ED Course.  Risk Prescription drug management.   Differential Diagnosis considered includes, but not limited to: COVID-19; influenza; RSV; simple viral URI; pneumonia; ACS; congestive heart failure exacerbation  Patient presents to the emergency department for evaluation of shortness of breath.  Patient reports a history of congestive heart failure and is in chronic atrial fibrillation.  She is tachycardic at arrival, likely partially secondary to her distress.  Examination reveals rales bilaterally, presentation consistent with heart failure exacerbation.  Portable chest x-ray confirms edema.  Record review reveals echo from September 2024, ejection fraction 30 to 35%.   Patient has been initiating  diuresis as an outpatient at the direction of her cardiologist.  Patient tachypneic and in distress, initiated on BiPAP with significant improvement.  IV Lasix administered.  Will require hospitalization for respiratory failure secondary to congestive heart failure.  CRITICAL CARE Performed by: Gilda Crease   Total critical care time: 35 minutes  Critical care time was exclusive of separately billable procedures and treating other patients.  Critical care was necessary to treat or prevent imminent or life-threatening deterioration.  Critical care was time spent personally by me on the following activities: development of treatment plan with patient and/or surrogate as well as nursing, discussions with consultants, evaluation of patient's response to treatment, examination of patient, obtaining history from patient or surrogate, ordering and performing treatments and interventions, ordering and review of laboratory studies, ordering and review of radiographic studies, pulse oximetry and re-evaluation of patient's condition.         Final Clinical Impression(s) / ED Diagnoses Final diagnoses:  Acute respiratory failure with hypoxia (HCC)  Acute on chronic combined systolic and diastolic congestive heart failure [facility])    Rx / DC Orders ED Discharge Orders     None         Strother Everitt, Canary Brim, MD 08/16/23 [phone]

## 2023-08-16 NOTE — Progress Notes (Signed)
Pt placed on bipap due to increased WOB per MD order. Pt is tolerating it well at this time.

## 2023-08-16 NOTE — ED Notes (Signed)
Family at bedside patient is on bipap at this time

## 2023-08-16 NOTE — Progress Notes (Signed)
PROGRESS NOTE    Rhonda Marshall  UJW:[id number] DOB: 29-Sep-1950 DOA: 08/15/2023 PCP: Knox Royalty, MD   Brief Narrative:  Rhonda Marshall is a 73 y.o. female with history of chronic HFrEF likely may be related to tachyarrhythmia due to A-fib, chronic kidney disease stage III, hyperthyroidism to the ER with complaints of shortness of breath.  Patient had recently followed up with her cardiologist Dr. Antoine Poche on 08/05/2023 when patient was advised to take Lasix 20 milligrams daily for 3 days and her Toprol dose was increased.  Over the last 1 week as per the family patient has been feeling weak and has lost at least 7 pounds and has been recently bedbound with orthopnea and also has been having increasing itching on the lower extremity.  Became more short of breath and was brought to the ER.   ED Course: In the ER patient was hypoxic and dyspneic and was placed on BiPAP.  Chest x-ray shows pulmonary edema with pleural effusion.  BNP was more than 4500 troponins were 94 and 87.  WBC count 12.2 blood gas shows pH of 7.35 pCO2 of 44.  Patient was given 80 mg IV Lasix and admitted for acute respiratory failure with hypoxia secondary to CHF exacerbation.  Initial heart rate was elevated but has subsequently improved.  Assessment & Plan:    Acute respiratory failure with hypoxia secondary to acute on chronic HFrEF last -EF measured on 9/24 was 30 to 35%.  BNP elevated, chest x-ray concerning for fluid overload.  - Patient received 80 mg IV Lasix.   -Requiring BiPAP on admission.  Currently on nasal cannula. -Continue Lasix 40 IV twice daily, strict INO's and daily weight.  Monitor electrolytes and renal function. -Will try to wean off of oxygen as tolerated.  Atrial fibrillation  -initial rate was elevated.  Subsequently has improved.   -On amiodarone.  Toprol-XL 100 mg in the morning and 50 in the evening.  On Eliquis for anticoagulation  Elevated troponin -Likely in the setting of demand  ischemia due to fluid overload.  Patient denies any chest pain. -Discussed with on-call cardiology Dr. Wende Crease her troponin are flat.  No concern for NSTEMI  Chronic kidney disease stage III creatinine at her baseline.  Prediabetes: A1c 6.1  Elevated LFTs follow LFTs.  Could be from CHF.  Hyperthyroidism on methimazole.  TSH: WNL  Chronic pain syndrome: Percocet at home.  Will continue same  Anxiety: Continue Xanax as needed  Hypokalemia: Hypomagnesemia: Replenished.  DVT prophylaxis: Eliquis Code Status: Full code Family Communication: Son and daughter-in-law present at bedside.   Disposition Plan: To be determined  Consultants:  None  Procedures:  None  Antimicrobials:  None  Status is: Inpatient   Subjective: Patient seen and examined.  Family at the bedside.  Reports overall improvement in her breathing.  Objective: Vitals:   08/16/23 0800 08/16/23 0830 08/16/23 0837 08/16/23 0931  BP: 98/81 101/87  (!) 121/90  Pulse: (!) 105 76  88  Resp: 16 (!) 21    Temp:      TempSrc:      SpO2: (!) 89% 93%    Weight:   45.5 kg   Height:   5\' 1"  (1.549 m)     Intake/Output Summary (Last 24 hours) at 08/16/2023 1011 Last data filed at 08/16/2023 0826 Gross per 24 hour  Intake --  Output 1750 ml  Net -1750 ml   Filed Weights   08/16/23 0837  Weight: 45.5 kg    Examination:  General exam: Appears calm and comfortable, on nasal cannula, elderly female lying comfortably on the bed.  Family at the bedside. Respiratory system: Crackles is noted on the bases.. Cardiovascular system: S1 & S2 heard, RRR. No JVD, murmurs, rubs, gallops or clicks. No pedal edema. Gastrointestinal system: Abdomen is nondistended, soft and nontender. No organomegaly or masses felt. Normal bowel sounds heard. Central nervous system: Alert and oriented. No focal neurological deficits. Extremities: Symmetric 5 x 5 power. Skin: No rashes, lesions or ulcers Psychiatry: Judgement and  insight appear normal. Mood & affect appropriate.    Data Reviewed: I have personally reviewed following labs and imaging studies  CBC: Recent Labs  Lab 08/16/23 0032 08/16/23 0553  WBC 12.2* 8.1  NEUTROABS 10.7* 7.1  HGB 15.0 13.6  HCT 47.2* 43.0  MCV 93.3 90.7  PLT 212 189   Basic Metabolic Panel: Recent Labs  Lab 08/16/23 0032 08/16/23 0553  NA 139 139  K 3.6 2.9*  CL 105 99  CO2 20* 29  GLUCOSE 204* 111*  BUN 28* 27*  CREATININE 1.18* 1.14*  CALCIUM 9.3 9.0  MG  --  1.8   GFR: Estimated Creatinine Clearance: 31.6 mL/min (A) (by C-G formula based on SCr of 1.14 mg/dL (H)). Liver Function Tests: Recent Labs  Lab 08/16/23 0032 08/16/23 0553  AST 48* 43*  ALT 28 26  ALKPHOS 157* 135*  BILITOT 1.1 1.5*  PROT 7.3 6.6  ALBUMIN 3.2* 2.9*   No results for input(s): "LIPASE", "AMYLASE" in the last 168 hours. No results for input(s): "AMMONIA" in the last 168 hours. Coagulation Profile: No results for input(s): "INR", "PROTIME" in the last 168 hours. Cardiac Enzymes: No results for input(s): "CKTOTAL", "CKMB", "CKMBINDEX", "TROPONINI" in the last 168 hours. BNP (last 3 results) No results for input(s): "PROBNP" in the last 8760 hours. HbA1C: Recent Labs    08/16/23 0553  HGBA1C 6.1*   CBG: No results for input(s): "GLUCAP" in the last 168 hours. Lipid Profile: No results for input(s): "CHOL", "HDL", "LDLCALC", "TRIG", "CHOLHDL", "LDLDIRECT" in the last 72 hours. Thyroid Function Tests: Recent Labs    08/16/23 0553  TSH 2.240   Anemia Panel: No results for input(s): "VITAMINB12", "FOLATE", "FERRITIN", "TIBC", "IRON", "RETICCTPCT" in the last 72 hours. Sepsis Labs: No results for input(s): "PROCALCITON", "LATICACIDVEN" in the last 168 hours.  Recent Results (from the past 240 hours)  Resp panel by RT-PCR (RSV, Flu A&B, Covid) Anterior Nasal Swab     Status: None   Collection Time: 08/16/23  2:14 AM   Specimen: Anterior Nasal Swab  Result Value Ref  Range Status   SARS Coronavirus 2 by RT PCR NEGATIVE NEGATIVE Final    Comment: (NOTE) SARS-CoV-2 target nucleic acids are NOT DETECTED.  The SARS-CoV-2 RNA is generally detectable in upper respiratory specimens during the acute phase of infection. The lowest concentration of SARS-CoV-2 viral copies this assay can detect is 138 copies/mL. A negative result does not preclude SARS-Cov-2 infection and should not be used as the sole basis for treatment or other patient management decisions. A negative result may occur with  improper specimen collection/handling, submission of specimen other than nasopharyngeal swab, presence of viral mutation(s) within the areas targeted by this assay, and inadequate number of viral copies(<138 copies/mL). A negative result must be combined with clinical observations, patient history, and epidemiological information. The expected result is Negative.  Fact Sheet for Patients:  BloggerCourse.com  Fact Sheet for Healthcare Providers:  SeriousBroker.it  This test is no t  yet approved or cleared by the [place] and  has been authorized for detection and/or diagnosis of SARS-CoV-2 by FDA under an Emergency Use Authorization (EUA). This EUA will remain  in effect (meaning this test can be used) for the duration of the COVID-19 declaration under Section 564(b)(1) of the Act, 21 U.S.C.section 360bbb-3(b)(1), unless the authorization is terminated  or revoked sooner.       Influenza A by PCR NEGATIVE NEGATIVE Final   Influenza B by PCR NEGATIVE NEGATIVE Final    Comment: (NOTE) The Xpert Xpress SARS-CoV-2/FLU/RSV plus assay is intended as an aid in the diagnosis of influenza from Nasopharyngeal swab specimens and should not be used as a sole basis for treatment. Nasal washings and aspirates are unacceptable for Xpert Xpress SARS-CoV-2/FLU/RSV testing.  Fact Sheet for  Patients: BloggerCourse.com  Fact Sheet for Healthcare Providers: SeriousBroker.it  This test is not yet approved or cleared by the [place] FDA and has been authorized for detection and/or diagnosis of SARS-CoV-2 by FDA under an Emergency Use Authorization (EUA). This EUA will remain in effect (meaning this test can be used) for the duration of the COVID-19 declaration under Section 564(b)(1) of the Act, 21 U.S.C. section 360bbb-3(b)(1), unless the authorization is terminated or revoked.     Resp Syncytial Virus by PCR NEGATIVE NEGATIVE Final    Comment: (NOTE) Fact Sheet for Patients: BloggerCourse.com  Fact Sheet for Healthcare Providers: SeriousBroker.it  This test is not yet approved or cleared by the [place] FDA and has been authorized for detection and/or diagnosis of SARS-CoV-2 by FDA under an Emergency Use Authorization (EUA). This EUA will remain in effect (meaning this test can be used) for the duration of the COVID-19 declaration under Section 564(b)(1) of the Act, 21 U.S.C. section 360bbb-3(b)(1), unless the authorization is terminated or revoked.  Performed at [facility], [address], [place], [place] [address]       Radiology Studies: [facility] Chest Port 1 View Result Date: 08/15/2023 CLINICAL DATA:  Shortness of breath EXAM: PORTABLE CHEST 1 VIEW COMPARISON:  04/02/2023 FINDINGS: Moderate cardiomegaly with bilateral interstitial opacities likely mild pulmonary edema. Small left pleural effusion. IMPRESSION: Moderate cardiomegaly with mild pulmonary edema and small left pleural effusion. Electronically Signed   By: Deatra Robinson M.D.   On: 08/15/2023 23:57    Scheduled Meds:  amiodarone  200 mg Oral Daily   apixaban  5 mg Oral BID   Chlorhexidine Gluconate Cloth  6 each Topical Daily   furosemide  40 mg Intravenous BID    methimazole  5 mg Oral Daily   metoprolol succinate  100 mg Oral Daily   metoprolol succinate  50 mg Oral QHS   Continuous Infusions:  potassium chloride 10 mEq (08/16/23 0853)     LOS: 0 days   Time spent: 35 minutes   Treavon Castilleja Estill Cotta, MD [facility]  If 7PM-7AM, please contact night-coverage [email or web address] 08/16/2023, 10:11 AM

## 2023-08-16 NOTE — Progress Notes (Signed)
Rt took pt off BIPAP. Pt currently on RA doing well at this time. Family at bedside Rn aware of pt off and on RA.

## 2023-08-16 NOTE — Consult Note (Signed)
Cardiology Consultation   Marshall ID: Rhonda Marshall MRN: [id number]; DOB: 1950/10/27  Admit date: 08/15/2023 Date of Consult: 08/16/2023  PCP:  Knox Royalty, MD   [facility] Providers Cardiologist:  Rollene Rotunda, MD      Marshall Profile:   Rhonda Marshall is a 73 y.o. female with a hx of chronic systolic heart failure, persistent atrial fibrillation on anticoagulation, LBBB, hypertension, RA, who is being seen 08/16/2023 for the evaluation of CHF exacerbation at the request of Dr. Jacqulyn Bath.  History of Present Illness:   Rhonda Marshall underwent DCCV 07/2014.  Heart monitor 11/2006 with permanent atrial fibrillation that was not rate controlled during wakign hours, but bradycardia during nocturnal hours.  Echo 03/2017 with LVEF 40%. Ischemic evaluation in 2018 with myoview showed no perfusion defects. Repeat heart monitor 04/2017 with better rate control. With better rate control, echo 05/2019 showed LVEF 45-50%.   She was seen during hospitalization 01/2022 with closed hip fracture complicated by A-fib RVR.  She had repeat DCCV on amiodarone but returned to atrial fibrillation.  Echocardiogram 01/19/2022 with an LVEF 30-35%, mildly reduced RV function, mild MR, mild LAE.  Rate control was pursued with 100 mg Toprol and amiodarone taper.  GDMT was titrated for reduced EF.  Most recent echocardiogram 03/2023 showed persistent LVEF 30-35%.  Etiology of CHF felt related to persistent atrial fibrillation.   She has frequent UTIs, SGLT2i deferred.  Entresto and spironolactone discontinued due to hypotension.  She was last seen by Dr. Antoine Poche in clinic 08/05/2023 and was noted to chronically use 3 pillows and appeared volume up prompting short course of  Lasix 20 mg.  Beta-blocker was also increased for better rate control with 100 mg Toprol in the morning and 50 mg Toprol in the evening.  She presented to [facility] ED 08/15/2023 with 2 days of shortness of breath found to be hypoxic at 83% on room  air requiring BiPAP.   HS troponin 94 --> 87 --> 121 TSH 2.240 Mg 1.8 K 2.9 sCr 1.14 - at baseline Albumin 2.9 AST mildly elevated 43 Alk phos mildly elevated at 135 BNP > 4500  CXR mild pulmonary edema, small left pleural effusion  Cardiology asked to consult for management of diuresis. Initially RVR on arrival, rate better controlled today. Has received 80 mg IV lasix OTO.     Past Medical History:  Diagnosis Date   Anxiety    Atrial fibrillation (HCC)    newly diagnosed 10/2010   Family history of adverse reaction to anesthesia    Sister's BP drops    Hip fracture (HCC) 01/18/2022   Hypertension    Rheumatoid arthritis(714.0)    Subclinical hyperthyroidism     Past Surgical History:  Procedure Laterality Date   Arm surgery     fracture, right arm   CARDIOVERSION N/A 08/10/2014   Procedure: CARDIOVERSION;  Surgeon: Lars Masson, MD;  Location: [facility] ENDOSCOPY;  Service: Cardiovascular;  Laterality: N/A;   CARDIOVERSION N/A 01/22/2022   Procedure: CARDIOVERSION;  Surgeon: Meriam Sprague, MD;  Location: [facility] ENDOSCOPY;  Service: Cardiovascular;  Laterality: N/A;   CATARACT EXTRACTION     DIRECT LARYNGOSCOPY N/A 09/02/2019   Procedure: DIRECT LARYNGOSCOPY WITH BIOPSY;  Surgeon: Drema Halon, MD;  Location: [facility];  Service: ENT;  Laterality: N/A;   HIP ARTHROPLASTY Right 01/19/2022   Procedure: ARTHROPLASTY BIPOLAR HIP (HEMIARTHROPLASTY);  Surgeon: Teryl Lucy, MD;  Location: [facility] ORS;  Service: Orthopedics;  Laterality: Right;   IR GASTROSTOMY TUBE  MOD SED  10/17/2019   IR GASTROSTOMY TUBE REMOVAL  01/03/2020   TEE WITHOUT CARDIOVERSION N/A 01/22/2022   Procedure: TRANSESOPHAGEAL ECHOCARDIOGRAM (TEE);  Surgeon: Meriam Sprague, MD;  Location: [facility] ENDOSCOPY;  Service: Cardiovascular;  Laterality: N/A;   TUBAL LIGATION       Home Medications:  Prior to Admission medications   Medication Sig Start Date End Date Taking? Authorizing  Provider  acetaminophen (TYLENOL) 500 MG tablet Take 500 mg by mouth as needed for moderate pain.   Yes [provider]  ALPRAZolam Prudy Feeler) 1 MG tablet Take 1 mg by mouth 4 (four) times daily as needed for anxiety.   Yes [provider]  amiodarone (PACERONE) 200 MG tablet Take 1 tablet (200 mg total) by mouth daily. 04/22/23  Yes Rollene Rotunda, MD  diclofenac Sodium (VOLTAREN) 1 % GEL Apply 2 g topically 4 (four) times daily as needed.   Yes [provider]  docusate sodium (COLACE) 100 MG capsule Take 1 capsule (100 mg total) by mouth 2 (two) times daily as needed for mild constipation. 04/02/23  Yes Sheikh, Omair Latif, DO  ELIQUIS 5 MG TABS tablet Take 1 tablet (5 mg total) by mouth 2 (two) times daily. 06/18/23  Yes Rollene Rotunda, MD  loperamide (IMODIUM A-D) 2 MG tablet Take 2 mg by mouth 4 (four) times daily as needed for diarrhea or loose stools.   Yes [provider]  methimazole (TAPAZOLE) 5 MG tablet Take 1 tablet (5 mg total) by mouth daily. 07/22/23  Yes Thapa, [place], MD  metoprolol succinate (TOPROL-XL) 100 MG 24 hr tablet Take 1 tablet (100 mg total) by mouth daily. In the morning and 1/2 tablet (50) mg in the evening. 08/05/23  Yes Rollene Rotunda, MD  Multiple Vitamin (MULTIVITAMIN WITH MINERALS) TABS tablet Take 1 tablet by mouth daily.    Yes [provider]  oxyCODONE-acetaminophen (PERCOCET) 10-325 MG tablet Take 1 tablet by mouth every 4 (four) hours as needed for pain. 01/24/22  Yes Janine Ores K, PA-C  Vitamin D, Ergocalciferol, (DRISDOL) 1.25 MG (50000 UNIT) CAPS capsule Take 50,000 Units by mouth once a week. 12/28/21  Yes [provider]  furosemide (LASIX) 20 MG tablet Take 1 tablet (20 mg total) by mouth daily for 3 days. For 3 days. 08/05/23 08/08/23  Rollene Rotunda, MD    Inpatient Medications: Scheduled Meds:  amiodarone  200 mg Oral Daily   apixaban  5 mg Oral BID   methimazole  5 mg Oral Daily   metoprolol  succinate  100 mg Oral Daily   metoprolol succinate  50 mg Oral QHS   Continuous Infusions:  potassium chloride     PRN Meds: ALPRAZolam, docusate sodium, oxyCODONE-acetaminophen **AND** oxyCODONE  Allergies:    Allergies  Allergen Reactions   Plaquenil [Hydroxychloroquine] Other (See Comments)    "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"    Clindamycin/Lincomycin Rash   Latex Rash    Latex gloves    Social History:   Social History   Socioeconomic History   Marital status: Widowed    Spouse name: Not on file   Number of children: 1   Years of education: Not on file   Highest education level: Not on file  Occupational History   Occupation: laborer    Comment: [employer]  Tobacco Use   Smoking status: Former    Current packs/day: 0.00    Average packs/day: 0.3 packs/day for 54.2 years (13.6 ttl pk-yrs)    Types: Cigarettes  Start date: 55    Quit date: 10/2019    Years since quitting: 3.8    Passive exposure: Past   Smokeless tobacco: Never  Vaping Use   Vaping status: Never Used  Substance and Sexual Activity   Alcohol use: Not Currently    Alcohol/week: 0.0 standard drinks of alcohol   Drug use: No   Sexual activity: Not on file  Other Topics Concern   Not on file  Social History Narrative   Not on file   Social Drivers of Health   Financial Resource Strain: Not on file  Food Insecurity: Marshall Declined (03/29/2023)   Hunger Vital Sign    Worried About Running Out of Food in the Last Year: Marshall declined    Ran Out of Food in the Last Year: Marshall declined  Transportation Needs: Marshall Declined (03/29/2023)   PRAPARE - Administrator, [employer] (Medical): Marshall declined    Lack of Transportation (Non-Medical): Marshall declined  Physical Activity: Not on file  Stress: Not on file  Social Connections: Not on file  Intimate Partner Violence: Not At Risk (03/29/2023)   Humiliation, Afraid, Rape, and Kick questionnaire    Fear of Current or  Ex-Partner: No    Emotionally Abused: No    Physically Abused: No    Sexually Abused: No    Family History:    Family History  Problem Relation Age of Onset   Heart attack Mother 86       pacemaker age 90   Hypothyroidism Mother    Cancer Father        colon and prostate   Hypothyroidism Sister    Cancer Sister    Diabetes Brother    CAD Brother 45   Cancer Brother        Renal     ROS:  Please see the history of present illness.   All other ROS reviewed and negative.     Physical Exam/Data:   Vitals:   08/16/23 0042 08/16/23 0215 08/16/23 0336 08/16/23 0419  BP: (!) 131/118 (!) 119/90 115/87   Pulse: (!) 118 (!) 120 (!) 107   Resp: (!) 33 (!) 25 (!) 31   Temp:    98.2 F (36.8 C)  TempSrc:    Axillary  SpO2: 98% 98% 99%     Intake/Output Summary (Last 24 hours) at 08/16/2023 0737 Last data filed at 08/16/2023 0540 Gross per 24 hour  Intake --  Output 1000 ml  Net -1000 ml      08/05/2023    3:47 PM 07/22/2023    4:05 PM 04/02/2023    4:20 AM  Last 3 Weights  Weight (lbs) 115 lb 3.2 oz 111 lb 3.2 oz 114 lb 13.8 oz  Weight (kg) 52.254 kg 50.44 kg 52.1 kg     There is no height or weight on file to calculate BMI.  General:  Well nourished, well developed, in no acute distress HEENT: normal Neck: no JVD Vascular: No carotid bruits; Distal pulses 2+ bilaterally Cardiac:  normal S1, S2; RRR; no murmur  Lungs:  clear to auscultation bilaterally, no wheezing, rhonchi or rales  Abd: soft, nontender, no hepatomegaly  Ext: no edema Musculoskeletal:  No deformities, BUE and BLE strength normal and equal Skin: warm and dry  Neuro:  CNs 2-12 intact, no focal abnormalities noted Psych:  Normal affect   EKG:  The EKG was personally reviewed and demonstrates:  Afib with VR 138, LBBB Telemetry:  Telemetry was  personally reviewed and demonstrates: Atrial fibrillation  Relevant CV Studies:  Echo 03/2023:  1. Left ventricular ejection fraction, by estimation, is 30 to  35%. The  left ventricle has moderately decreased function. The left ventricle  demonstrates global hypokinesis. Left ventricular diastolic parameters are  indeterminate.   2. Right ventricular systolic function is normal. The right ventricular  size is normal.   3. Left atrial size was severely dilated.   4. Right atrial size was moderately dilated.   5. Moderate pleural effusion in the left lateral region.   6. The mitral valve is degenerative. Mild mitral valve regurgitation. No  evidence of mitral stenosis.   7. The aortic valve is tricuspid. Aortic valve regurgitation is not  visualized. Aortic valve sclerosis is present, with no evidence of aortic  valve stenosis.   Laboratory Data:  High Sensitivity Troponin:   Recent Labs  Lab 08/16/23 0032 08/16/23 0213  TROPONINIHS 94* 87*     Chemistry Recent Labs  Lab 08/16/23 0032 08/16/23 0553  NA 139 139  K 3.6 2.9*  CL 105 99  CO2 20* 29  GLUCOSE 204* 111*  BUN 28* 27*  CREATININE 1.18* 1.14*  CALCIUM 9.3 9.0  MG  --  1.8  GFRNONAA 49* 51*  ANIONGAP 14 11    Recent Labs  Lab 08/16/23 0032 08/16/23 0553  PROT 7.3 6.6  ALBUMIN 3.2* 2.9*  AST 48* 43*  ALT 28 26  ALKPHOS 157* 135*  BILITOT 1.1 1.5*   Lipids No results for input(s): "CHOL", "TRIG", "HDL", "LABVLDL", "LDLCALC", "CHOLHDL" in the last 168 hours.  Hematology Recent Labs  Lab 08/16/23 0032 08/16/23 0553  WBC 12.2* 8.1  RBC 5.06 4.74  HGB 15.0 13.6  HCT 47.2* 43.0  MCV 93.3 90.7  MCH 29.6 28.7  MCHC 31.8 31.6  RDW 19.8* 18.7*  PLT 212 189   Thyroid  Recent Labs  Lab 08/16/23 0553  TSH 2.240    BNP Recent Labs  Lab 08/16/23 0032  BNP >4,500.0*    DDimer No results for input(s): "DDIMER" in the last 168 hours.   Radiology/Studies:  DG Chest Port 1 View Result Date: 08/15/2023 CLINICAL DATA:  Shortness of breath EXAM: PORTABLE CHEST 1 VIEW COMPARISON:  04/02/2023 FINDINGS: Moderate cardiomegaly with bilateral interstitial opacities  likely mild pulmonary edema. Small left pleural effusion. IMPRESSION: Moderate cardiomegaly with mild pulmonary edema and small left pleural effusion. Electronically Signed   By: Deatra Robinson M.D.   On: 08/15/2023 23:57     Assessment and Plan:   Acute on chronic systolic heart failure Known LVEF 30-35% felt related to Afib and rate - GDMT limited by BP - no spironolactone or entresto - 100 / 50 mg toprol - no SGLT2i given frequent UTIs - has received 80 mg IV lasix OTO - BP now 96/80 - BNP > 4500 - required BiPAP overnight, now on room air   Hypokalemia CKD III - sCr at baseline - K 2.9 - is receiving IV replacement per primary - would also give 2 g IV Mg to help support replacement   Permanent atrial fibrillation Chronic anticoagulation - rate controlled with toprol and amiodarone - continue eliquis - Hb stable - may need to hold BB with low output heart failure   Mild troponin elevation - suspect due to CHF exacerbation - hold off on ischemic evaluation for now, focus on diuresis and rate control     Risk Assessment/Risk Scores:    New York Heart Association (NYHA) Functional  Class NYHA Class IV  CHA2DS2-VASc Score = 3   Rhonda indicates a 3.2% annual risk of stroke. The Marshall's score is based upon: CHF History: 1 HTN History: 0 Diabetes History: 0 Stroke History: 0 Vascular Disease History: 0 Age Score: 1 Gender Score: 1       For questions or updates, please contact [facility] Please consult [email or web address] for contact info under    Signed, Marcelino Duster, PA  08/16/2023 7:37 AM  I have seen and examined Rhonda Marshall with Rhonda Marshall.  Agree with above, note added to reflect my findings.  Marshall with a past history significant for chronic systolic heart failure, atrial fibrillation, left bundle branch block, hypertension.  She presented to the hospital with increasing shortness of breath.  She was found to be hypoxic in the emergency  room requiring BiPAP.  She previously saw cardiology as an outpatient, and had 3 days of Lasix.  After Rhonda, she did note increasing shortness of breath and tachycardia and thus came to the emergency room.  She was given IV Lasix with brisk diuresis and improvement.  She is now off BiPAP and feeling much improved.  GEN: Well nourished, well developed, in no acute distress  HEENT: normal  Neck: no JVD, carotid bruits, or masses Cardiac: Irregular; no murmurs, rubs, or gallops,no edema  Respiratory: Crackles throughout  GI: soft, nontender, nondistended, + BS MS: no deformity or atrophy  Skin: warm and dry Neuro:  Strength and sensation are intact Psych: euthymic mood, full affect   Acute on chronic systolic heart failure: Ejection fraction 30 to 35%.  Potentially related to rapid atrial fibrillation.  She presented volume overloaded with an elevated BNP.  She received BiPAP overnight.  She is now on room air.  She does continue to have crackles throughout her lungs.  She Ltanya Bayley require continued diuresis.  Horice Carrero plan for 40 mg Lasix twice daily today.  Rooney Swails need potassium supplementation as Rhonda was severely reduced on admission. Atrial fibrillation: Has been in atrial fibrillation for quite a while.  On metoprolol and amiodarone.  Per cardiology notes, no plans for rhythm control.  Heart rates are better controlled now that respiratory status has improved. Mild troponin elevation: Likely due to heart failure exacerbation.  No plans for ischemic evaluation. Hypokalemia: Supplementation per primary team CKD stage III: Zaivion Kundrat need continued evaluation during diuresis.  Dorien Bessent M. Jibreel Fedewa MD 08/16/2023 8:59 AM

## 2023-08-16 NOTE — H&P (Addendum)
History and Physical    Rhonda Marshall:[id number] DOB: 1950-10-23 DOA: 08/15/2023  Patient coming from: Home.  Chief Complaint: Shortness of breath.  HPI: Rhonda Marshall is a 73 y.o. female with history of chronic HFrEF likely may be related to tachyarrhythmia due to A-fib, chronic kidney disease stage III, hyperthyroidism to the ER with complaints of shortness of breath.  Patient had recently followed up with her cardiologist Dr. Antoine Poche on 08/05/2023 when patient was advised to take Lasix 20 milligrams daily for 3 days and her Toprol dose was increased.  Over the last 1 week as per the family patient has been feeling weak and has lost at least 7 pounds and has been recently bedbound with orthopnea and also has been having increasing itching on the lower extremity.  Became more short of breath and was brought to the ER.  ED Course: In the ER patient was hypoxic and dyspneic and was placed on BiPAP.  Chest x-ray shows pulmonary edema with pleural effusion.  BNP was more than 4500 troponins were 94 and 87.  WBC count 12.2 blood gas shows pH of 7.35 pCO2 of 44.  Patient was given 80 mg IV Lasix and admitted for acute respiratory failure with hypoxia secondary to CHF exacerbation.  Initial heart rate was elevated but has subsequently improved.  Review of Systems: As per HPI, rest all negative.   Past Medical History:  Diagnosis Date   Anxiety    Atrial fibrillation (HCC)    newly diagnosed 10/2010   Family history of adverse reaction to anesthesia    Sister's BP drops    Hip fracture (HCC) 01/18/2022   Hypertension    Rheumatoid arthritis(714.0)    Subclinical hyperthyroidism     Past Surgical History:  Procedure Laterality Date   Arm surgery     fracture, right arm   CARDIOVERSION N/A 08/10/2014   Procedure: CARDIOVERSION;  Surgeon: Lars Masson, MD;  Location: [facility] ENDOSCOPY;  Service: Cardiovascular;  Laterality: N/A;   CARDIOVERSION N/A 01/22/2022   Procedure:  CARDIOVERSION;  Surgeon: Meriam Sprague, MD;  Location: [facility] ENDOSCOPY;  Service: Cardiovascular;  Laterality: N/A;   CATARACT EXTRACTION     DIRECT LARYNGOSCOPY N/A 09/02/2019   Procedure: DIRECT LARYNGOSCOPY WITH BIOPSY;  Surgeon: Drema Halon, MD;  Location: [facility];  Service: ENT;  Laterality: N/A;   HIP ARTHROPLASTY Right 01/19/2022   Procedure: ARTHROPLASTY BIPOLAR HIP (HEMIARTHROPLASTY);  Surgeon: Teryl Lucy, MD;  Location: [facility] ORS;  Service: Orthopedics;  Laterality: Right;   IR GASTROSTOMY TUBE MOD SED  10/17/2019   IR GASTROSTOMY TUBE REMOVAL  01/03/2020   TEE WITHOUT CARDIOVERSION N/A 01/22/2022   Procedure: TRANSESOPHAGEAL ECHOCARDIOGRAM (TEE);  Surgeon: Meriam Sprague, MD;  Location: [facility] ENDOSCOPY;  Service: Cardiovascular;  Laterality: N/A;   TUBAL LIGATION       reports that she quit smoking about 3 years ago. Her smoking use included cigarettes. She started smoking about 58 years ago. She has a 13.6 pack-year smoking history. She has been exposed to tobacco smoke. She has never used smokeless tobacco. She reports that she does not currently use alcohol. She reports that she does not use drugs.  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 Authorizing Provider  acetaminophen (TYLENOL) 500 MG tablet Take 500 mg by mouth as needed for moderate pain.   Yes [provider]  ALPRAZolam Prudy Feeler) 1 MG tablet Take 1 mg by mouth 4 (four) times daily as needed  for anxiety.   Yes [provider]  amiodarone (PACERONE) 200 MG tablet Take 1 tablet (200 mg total) by mouth daily. 04/22/23  Yes Rollene Rotunda, MD  diclofenac Sodium (VOLTAREN) 1 % GEL Apply 2 g topically 4 (four) times daily as needed.   Yes [provider]  docusate sodium (COLACE) 100 MG capsule Take 1 capsule (100 mg total) by mouth 2 (two) times daily as needed for mild constipation. 04/02/23  Yes Sheikh, Omair Latif, DO  ELIQUIS 5 MG TABS tablet Take 1 tablet (5 mg total) by mouth 2 (two) times daily. 06/18/23  Yes Rollene Rotunda, MD  loperamide (IMODIUM A-D) 2 MG tablet Take 2 mg by mouth 4 (four) times daily as needed for diarrhea or loose stools.   Yes [provider]  methimazole (TAPAZOLE) 5 MG tablet Take 1 tablet (5 mg total) by mouth daily. 07/22/23  Yes Thapa, [place], MD  metoprolol succinate (TOPROL-XL) 100 MG 24 hr tablet Take 1 tablet (100 mg total) by mouth daily. In the morning and 1/2 tablet (50) mg in the evening. 08/05/23  Yes Rollene Rotunda, MD  Multiple Vitamin (MULTIVITAMIN WITH MINERALS) TABS tablet Take 1 tablet by mouth daily.    Yes [provider]  oxyCODONE-acetaminophen (PERCOCET) 10-325 MG tablet Take 1 tablet by mouth every 4 (four) hours as needed for pain. 01/24/22  Yes Janine Ores K, PA-C  Vitamin D, Ergocalciferol, (DRISDOL) 1.25 MG (50000 UNIT) CAPS capsule Take 50,000 Units by mouth once a week. 12/28/21  Yes [provider]  furosemide (LASIX) 20 MG tablet Take 1 tablet (20 mg total) by mouth daily for 3 days. For 3 days. 08/05/23 08/08/23  Rollene Rotunda, MD    Physical Exam: Constitutional: Moderately built and nourished. Vitals:   08/16/23 0042 08/16/23 0215 08/16/23 0336 08/16/23 0419  BP: (!) 131/118 (!) 119/90 115/87   Pulse: (!) 118 (!) 120 (!) 107   Resp: (!) 33 (!) 25 (!) 31   Temp:    98.2 F (36.8 C)  TempSrc:    Axillary  SpO2: 98% 98% 99%    Eyes: Anicteric no pallor. ENMT: No discharge from  the ears/nose or mouth. Neck: No mass felt.  JVD elevated. Respiratory: No rhonchi or crepitations. Cardiovascular: S1-S2 heard. Abdomen: Soft nontender bowel sound present. Musculoskeletal: No edema. Skin: Scratch marks.  On the lower extremity. Neurologic: Alert awake oriented to time place and person.  Moves all extremities. Psychiatric: Appears normal.  Normal affect.   Labs on Admission: I have personally reviewed following labs and imaging studies  CBC: Recent Labs  Lab 08/16/23 0032  WBC 12.2*  NEUTROABS 10.7*  HGB 15.0  HCT 47.2*  MCV 93.3  PLT 212   Basic Metabolic Panel: Recent Labs  Lab 08/16/23 0032  NA 139  K 3.6  CL 105  CO2 20*  GLUCOSE 204*  BUN 28*  CREATININE 1.18*  CALCIUM 9.3   GFR: Estimated Creatinine Clearance: 33.6 mL/min (A) (by C-G formula based on SCr of 1.18 mg/dL (H)). Liver Function Tests: Recent Labs  Lab 08/16/23 0032  AST 48*  ALT 28  ALKPHOS 157*  BILITOT 1.1  PROT 7.3  ALBUMIN 3.2*   No results for input(s): "LIPASE", "AMYLASE" in the last 168 hours. No results for input(s): "AMMONIA" in the last 168 hours. Coagulation Profile: No results for input(s): "INR", "PROTIME" in the last 168 hours. Cardiac Enzymes: No results for input(s): "CKTOTAL", "CKMB", "CKMBINDEX", "TROPONINI" in the last 168 hours. BNP (last 3 results) No results for input(s): "PROBNP" in the last 8760 hours. HbA1C: No results for input(s): "HGBA1C" in the last 72 hours. CBG: No results for input(s): "GLUCAP" in the last 168 hours. Lipid Profile: No results for input(s): "CHOL", "HDL", "LDLCALC", "TRIG", "CHOLHDL", "LDLDIRECT" in the last 72 hours. Thyroid Function Tests: No results for input(s): "TSH", "T4TOTAL", "FREET4", "T3FREE", "THYROIDAB" in the last 72 hours. Anemia Panel: No results for input(s): "VITAMINB12", "FOLATE", "FERRITIN", "TIBC", "IRON", "RETICCTPCT" in the last 72 hours. Urine analysis:    Component Value Date/Time    COLORURINE STRAW (A) 03/29/2023 1216   APPEARANCEUR CLEAR 03/29/2023 1216   LABSPEC 1.004 (L) 03/29/2023 1216   PHURINE 6.0 03/29/2023 1216   GLUCOSEU NEGATIVE 03/29/2023 1216   HGBUR MODERATE (A) 03/29/2023 1216   BILIRUBINUR NEGATIVE 03/29/2023 1216   KETONESUR NEGATIVE 03/29/2023 1216   PROTEINUR NEGATIVE 03/29/2023 1216   UROBILINOGEN 0.2 11/05/2010 1220   NITRITE NEGATIVE 03/29/2023 1216   LEUKOCYTESUR NEGATIVE 03/29/2023 1216   Sepsis Labs: @LABRCNTIP (procalcitonin:4,lacticidven:4) ) Recent Results (from the past 240 hours)  Resp panel by RT-PCR (RSV, Flu A&B, Covid) Anterior Nasal Swab     Status: None   Collection Time: 08/16/23  2:14 AM   Specimen: Anterior Nasal Swab  Result Value Ref Range Status   SARS Coronavirus 2 by RT PCR NEGATIVE NEGATIVE Final    Comment: (NOTE) SARS-CoV-2 target nucleic acids are NOT DETECTED.  The SARS-CoV-2 RNA is generally detectable in upper respiratory specimens during the acute phase of infection. The lowest concentration of SARS-CoV-2 viral copies this assay can detect is 138 copies/mL. A negative result does not preclude SARS-Cov-2 infection and should not be used as the sole basis for treatment or other patient management decisions. A negative result may occur with  improper specimen collection/handling, submission of specimen other than nasopharyngeal swab, presence of viral mutation(s) within the areas targeted by this assay, and inadequate number of viral copies(<138 copies/mL). A negative result must be combined with clinical observations, patient history, and epidemiological information. The expected result is Negative.  Fact Sheet for Patients:  BloggerCourse.com  Fact Sheet for Healthcare Providers:  SeriousBroker.it  This test is no t yet approved or cleared by the [place] FDA and  has been authorized for detection and/or diagnosis of SARS-CoV-2 by FDA under an  Emergency Use Authorization (EUA). This EUA will remain  in effect (meaning this test can be used) for the duration of the COVID-19 declaration under Section 564(b)(1) of the Act, 21 U.S.C.section 360bbb-3(b)(1), unless the authorization is terminated  or revoked sooner.       Influenza A by PCR NEGATIVE NEGATIVE Final   Influenza B by PCR NEGATIVE NEGATIVE Final    Comment: (NOTE) The Xpert Xpress SARS-CoV-2/FLU/RSV plus assay is intended as an aid in the diagnosis of influenza from Nasopharyngeal swab specimens and should not  be used as a sole basis for treatment. Nasal washings and aspirates are unacceptable for Xpert Xpress SARS-CoV-2/FLU/RSV testing.  Fact Sheet for Patients: BloggerCourse.com  Fact Sheet for Healthcare Providers: SeriousBroker.it  This test is not yet approved or cleared by the [place] FDA and has been authorized for detection and/or diagnosis of SARS-CoV-2 by FDA under an Emergency Use Authorization (EUA). This EUA will remain in effect (meaning this test can be used) for the duration of the COVID-19 declaration under Section 564(b)(1) of the Act, 21 U.S.C. section 360bbb-3(b)(1), unless the authorization is terminated or revoked.     Resp Syncytial Virus by PCR NEGATIVE NEGATIVE Final    Comment: (NOTE) Fact Sheet for Patients: BloggerCourse.com  Fact Sheet for Healthcare Providers: SeriousBroker.it  This test is not yet approved or cleared by the [place] FDA and has been authorized for detection and/or diagnosis of SARS-CoV-2 by FDA under an Emergency Use Authorization (EUA). This EUA will remain in effect (meaning this test can be used) for the duration of the COVID-19 declaration under Section 564(b)(1) of the Act, 21 U.S.C. section 360bbb-3(b)(1), unless the authorization is terminated or revoked.  Performed at [facility], [address] [place], [place] [address]      Radiological Exams on Admission: DG Chest Port 1 View Result Date: 08/15/2023 CLINICAL DATA:  Shortness of breath EXAM: PORTABLE CHEST 1 VIEW COMPARISON:  04/02/2023 FINDINGS: Moderate cardiomegaly with bilateral interstitial opacities likely mild pulmonary edema. Small left pleural effusion. IMPRESSION: Moderate cardiomegaly with mild pulmonary edema and small left pleural effusion. Electronically Signed   By: Deatra Robinson M.D.   On: 08/15/2023 23:57    EKG: Independently reviewed.  A-fib with RVR.  Assessment/Plan Principal Problem:   Acute respiratory failure with hypoxia (HCC) Active Problems:   Acute on chronic systolic CHF (congestive heart failure) (HCC)   Longstanding persistent atrial fibrillation (HCC)   Rheumatoid arthritis (HCC)   Hyperthyroidism   Elevated LFTs   CKD (chronic kidney disease) stage 3, GFR 30-59 ml/min (HCC)    Acute respiratory failure with hypoxia secondary to acute on chronic HFrEF last EF measured on 9/24 was 30 to 35%.  Patient received 80 mg IV Lasix.  Will try to dose further Lasix based on response and blood pressures.  Closely follow intake output metabolic panel daily weights.  Try to wean off BiPAP.   Atrial fibrillation initial rate was elevated.  Subsequently has improved.  On amiodarone.  Toprol-XL 100 mg in the morning and 50 in the evening.  On Eliquis for anticoagulation. Chronic kidney disease stage III creatinine at her baseline. Hyperglycemia check hemoglobin A1c. Elevated LFTs follow LFTs.  Could be from CHF. Elevated troponin could be from CHF.  Has remained flat. Hyperthyroidism on methimazole.  Check TSH.  Given that patient has acute respiratory failure secondary to CHF exacerbation will need close monitoring and more than 2 midnight stay in inpatient status.   DVT prophylaxis: Eliquis. Code Status: Full code. Family Communication: Family at the bedside. Disposition  Plan: Stepdown. Consults called: None. Admission status: Inpatient.

## 2023-08-16 NOTE — ED Notes (Signed)
 Report given to [place], [place]

## 2023-08-16 NOTE — ED Notes (Signed)
Patient was just removed off of bipap by RT, MD at bedside with family.

## 2023-08-16 NOTE — ED Notes (Signed)
ED TO INPATIENT HANDOFF REPORT  ED Nurse Name and Phone #: Romero Letizia, rn    S Name/Age/Gender Rhonda Marshall 73 y.o. female Room/Bed: RESB/RESB  Code Status   Code Status: Full Code  Home/SNF/Other Home Patient oriented to: self, place, time, and situation Is this baseline? Yes   Triage Complete: Triage complete  Chief Complaint Acute CHF (congestive heart failure) (HCC) [I50.9] Acute respiratory failure with hypoxia (HCC) [J96.01]  Triage Note Pt from home for sob x 2 days, denies fever, hx of CHF afib. On arrival sats 83% on room air, placed on 2 L with slight improvement to 88%    Allergies Allergies  Allergen Reactions   Plaquenil [Hydroxychloroquine] Other (See Comments)    "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"    Clindamycin/Lincomycin Rash   Latex Rash    Latex gloves    Level of Care/Admitting Diagnosis ED Disposition     ED Disposition  Admit   Condition  --   Comment  Hospital Area: [facility] [[id number]]  Level of Care: Stepdown [14]  Admit to SDU based on following criteria: Cardiac Instability:  Patients experiencing chest pain, unconfirmed MI and stable, arrhythmias and CHF requiring medical management and potentially compromising patient's stability  May admit patient to [facility] or [facility] if equivalent level of care is available:: No  Covid Evaluation: Asymptomatic - no recent exposure (last 10 days) testing not required  Diagnosis: Acute respiratory failure with hypoxia [facility]) [[id number]]  Admitting Physician: Eduard Clos [phone]  Attending Physician: Eduard Clos [phone]  Certification:: I certify this patient will need inpatient services for at least 2 midnights  Expected Medical Readiness: 08/18/2023          B Medical/Surgery History Past Medical History:  Diagnosis Date   Anxiety    Atrial fibrillation (HCC)    newly diagnosed 10/2010   Family history of adverse reaction to anesthesia    Sister's BP drops     Hip fracture (HCC) 01/18/2022   Hypertension    Rheumatoid arthritis(714.0)    Subclinical hyperthyroidism    Past Surgical History:  Procedure Laterality Date   Arm surgery     fracture, right arm   CARDIOVERSION N/A 08/10/2014   Procedure: CARDIOVERSION;  Surgeon: Lars Masson, MD;  Location[facility] END[facility]COPY;  Service: Cardiovascular;  Laterality: N/A;   CARDIOVERSION N/A 01/22/2022   Procedure: CARDIOVERSION;  Surgeon: Meriam Sprague, MD;  Location: [facility] ENDOSCOPY;  Service: Cardiovascular;  Laterality: N/A;   CATARACT EXTRACTION     DIRECT LARYNGOSCOPY N/A 09/02/2019   Procedure: DIRECT LARYNGOSCOPY WITH BIOPSY;  Surgeon: Drema Halon, MD;  Location: [facility];  Service: ENT;  Laterality: N/A;   HIP ARTHROPLASTY Right 01/19/2022   Procedure: ARTHROPLASTY BIPOLAR HIP (HEMIARTHROPLASTY);  Surgeon: Teryl Lucy, MD;  Location: [facility] ORS;  Service: Orthopedics;  Laterality: Right;   IR GASTROSTOMY TUBE MOD SED  10/17/2019   IR GASTROSTOMY TUBE REMOVAL  01/03/2020   TEE WITHOUT CARDIOVERSION N/A 01/22/2022   Procedure: TRANSESOPHAGEAL ECHOCARDIOGRAM (TEE);  Surgeon: Meriam Sprague, MD;  Location: [facility] ENDOSCOPY;  Service: Cardiovascular;  Laterality: N/A;   TUBAL LIGATION       A IV Location/Drains/Wounds Patient Lines/Drains/Airways Status     Active Line/Drains/Airways     Name Placement date Placement time Site Days   Peripheral IV 08/16/23 20 G 1" Left Antecubital 08/16/23  0031  Antecubital  less than 1   External Urinary Catheter 08/16/23  0049  --  less than 1            Intake/Output Last 24 hours  Intake/Output Summary (Last 24 hours) at 08/16/2023 0757 Last data filed at 08/16/2023 0540 Gross per 24 hour  Intake --  Output 1000 ml  Net -1000 ml    Labs/Imaging Results for orders placed or performed during the hospital encounter of 08/15/23 (from the past 48 hours)  CBC with Differential/Platelet     Status: Abnormal   Collection  Time: 08/16/23 12:32 AM  Result Value Ref Range   WBC 12.2 (H) 4.0 - 10.5 K/uL   RBC 5.06 3.87 - 5.11 MIL/uL   Hemoglobin 15.0 12.0 - 15.0 g/dL   HCT 14.7 (H) 82.9 - 56.2 %   MCV 93.3 80.0 - 100.0 fL   MCH 29.6 26.0 - 34.0 pg   MCHC 31.8 30.0 - 36.0 g/dL   RDW 13.0 (H) 86.5 - 78.4 %   Platelets 212 150 - 400 K/uL   nRBC 0.0 0.0 - 0.2 %   Neutrophils Relative % 88 %   Neutro Abs 10.7 (H) 1.7 - 7.7 K/uL   Lymphocytes Relative 7 %   Lymphs Abs 0.9 0.7 - 4.0 K/uL   Monocytes Relative 4 %   Monocytes Absolute 0.5 0.1 - 1.0 K/uL   Eosinophils Relative 0 %   Eosinophils Absolute 0.0 0.0 - 0.5 K/uL   Basophils Relative 0 %   Basophils Absolute 0.0 0.0 - 0.1 K/uL   Immature Granulocytes 1 %   Abs Immature Granulocytes 0.08 (H) 0.00 - 0.07 K/uL    Comment: Performed at [facility], [address], [place], [place] [address]  Comprehensive metabolic panel     Status: Abnormal   Collection Time: 08/16/23 12:32 AM  Result Value Ref Range   Sodium 139 135 - 145 mmol/L   Potassium 3.6 3.5 - 5.1 mmol/L   Chloride 105 98 - 111 mmol/L   CO2 20 (L) 22 - 32 mmol/L   Glucose, Bld 204 (H) 70 - 99 mg/dL    Comment: Glucose reference range applies only to samples taken after fasting for at least 8 hours.   BUN 28 (H) 8 - 23 mg/dL   Creatinine, Ser 5.28 (H) 0.44 - 1.00 mg/dL   Calcium 9.3 8.9 - 41.3 mg/dL   Total Protein 7.3 6.5 - 8.1 g/dL   Albumin 3.2 (L) 3.5 - 5.0 g/dL   AST 48 (H) 15 - 41 U/L   ALT 28 0 - 44 U/L   Alkaline Phosphatase 157 (H) 38 - 126 U/L   Total Bilirubin 1.1 0.0 - 1.2 mg/dL   GFR, Estimated 49 (L) >60 mL/min    Comment: (NOTE) Calculated using the CKD-EPI Creatinine Equation (2021)    Anion gap 14 5 - 15    Comment: Performed at [facility], [address], [place], [place] [address]  Troponin I (High Sensitivity)     Status: Abnormal   Collection Time: 08/16/23 12:32 AM  Result Value Ref Range   Troponin I (High Sensitivity)  94 (H) <18 ng/L    Comment: (NOTE) Elevated high sensitivity troponin I (hsTnI) values and significant  changes across serial measurements may suggest ACS but many other  chronic and acute conditions are known to elevate hsTnI results.  Refer to the "Links" section for chest pain algorithms and additional  guidance. Performed at [facility], [address], [place], [place] [address]   Brain natriuretic peptide  Status: Abnormal   Collection Time: 08/16/23 12:32 AM  Result Value Ref Range   B Natriuretic Peptide >4,500.0 (H) 0.0 - 100.0 pg/mL    Comment: Performed at [facility], [address] [place], [place] [address]  Blood gas, venous (at [facility] and [facility])     Status: None   Collection Time: 08/16/23 12:45 AM  Result Value Ref Range   pH, Ven 7.35 7.25 - 7.43   pCO2, Ven 44 44 - 60 mmHg   pO2, Ven 34 32 - 45 mmHg   Bicarbonate 24.3 20.0 - 28.0 mmol/L   Acid-base deficit 1.5 0.0 - 2.0 mmol/L   O2 Saturation 49.2 %   Patient temperature 37.0     Comment: Performed at [facility], [address] [place], [place] [address]  Troponin I (High Sensitivity)     Status: Abnormal   Collection Time: 08/16/23  2:13 AM  Result Value Ref Range   Troponin I (High Sensitivity) 87 (H) <18 ng/L    Comment: (NOTE) Elevated high sensitivity troponin I (hsTnI) values and significant  changes across serial measurements may suggest ACS but many other  chronic and acute conditions are known to elevate hsTnI results.  Refer to the "Links" section for chest pain algorithms and additional  guidance. Performed at [facility], [address] [place], [place] [address]   Resp panel by RT-PCR (RSV, Flu A&B, Covid) Anterior Nasal Swab     Status: None   Collection Time: 08/16/23  2:14 AM   Specimen: Anterior Nasal Swab  Result Value Ref Range   SARS Coronavirus 2 by RT PCR NEGATIVE NEGATIVE    Comment: (NOTE) SARS-CoV-2  target nucleic acids are NOT DETECTED.  The SARS-CoV-2 RNA is generally detectable in upper respiratory specimens during the acute phase of infection. The lowest concentration of SARS-CoV-2 viral copies this assay can detect is 138 copies/mL. A negative result does not preclude SARS-Cov-2 infection and should not be used as the sole basis for treatment or other patient management decisions. A negative result may occur with  improper specimen collection/handling, submission of specimen other than nasopharyngeal swab, presence of viral mutation(s) within the areas targeted by this assay, and inadequate number of viral copies(<138 copies/mL). A negative result must be combined with clinical observations, patient history, and epidemiological information. The expected result is Negative.  Fact Sheet for Patients:  BloggerCourse.com  Fact Sheet for Healthcare Providers:  SeriousBroker.it  This test is no t yet approved or cleared by the [place] FDA and  has been authorized for detection and/or diagnosis of SARS-CoV-2 by FDA under an Emergency Use Authorization (EUA). This EUA will remain  in effect (meaning this test can be used) for the duration of the COVID-19 declaration under Section 564(b)(1) of the Act, 21 U.S.C.section 360bbb-3(b)(1), unless the authorization is terminated  or revoked sooner.       Influenza A by PCR NEGATIVE NEGATIVE   Influenza B by PCR NEGATIVE NEGATIVE    Comment: (NOTE) The Xpert Xpress SARS-CoV-2/FLU/RSV plus assay is intended as an aid in the diagnosis of influenza from Nasopharyngeal swab specimens and should not be used as a sole basis for treatment. Nasal washings and aspirates are unacceptable for Xpert Xpress SARS-CoV-2/FLU/RSV testing.  Fact Sheet for Patients: BloggerCourse.com  Fact Sheet for Healthcare Providers: SeriousBroker.it  This  test is not yet approved or cleared by the [place] FDA and has been authorized for detection and/or diagnosis of SARS-CoV-2 by  FDA under an Emergency Use Authorization (EUA). This EUA will remain in effect (meaning this test can be used) for the duration of the COVID-19 declaration under Section 564(b)(1) of the Act, 21 U.S.C. section 360bbb-3(b)(1), unless the authorization is terminated or revoked.     Resp Syncytial Virus by PCR NEGATIVE NEGATIVE    Comment: (NOTE) Fact Sheet for Patients: BloggerCourse.com  Fact Sheet for Healthcare Providers: SeriousBroker.it  This test is not yet approved or cleared by the [place] FDA and has been authorized for detection and/or diagnosis of SARS-CoV-2 by FDA under an Emergency Use Authorization (EUA). This EUA will remain in effect (meaning this test can be used) for the duration of the COVID-19 declaration under Section 564(b)(1) of the Act, 21 U.S.C. section 360bbb-3(b)(1), unless the authorization is terminated or revoked.  Performed at [facility], [address], [place], [place] [address]   Comprehensive metabolic panel     Status: Abnormal   Collection Time: 08/16/23  5:53 AM  Result Value Ref Range   Sodium 139 135 - 145 mmol/L   Potassium 2.9 (L) 3.5 - 5.1 mmol/L   Chloride 99 98 - 111 mmol/L   CO2 29 22 - 32 mmol/L   Glucose, Bld 111 (H) 70 - 99 mg/dL    Comment: Glucose reference range applies only to samples taken after fasting for at least 8 hours.   BUN 27 (H) 8 - 23 mg/dL   Creatinine, Ser 5.62 (H) 0.44 - 1.00 mg/dL   Calcium 9.0 8.9 - 13.0 mg/dL   Total Protein 6.6 6.5 - 8.1 g/dL   Albumin 2.9 (L) 3.5 - 5.0 g/dL   AST 43 (H) 15 - 41 U/L   ALT 26 0 - 44 U/L   Alkaline Phosphatase 135 (H) 38 - 126 U/L   Total Bilirubin 1.5 (H) 0.0 - 1.2 mg/dL   GFR, Estimated 51 (L) >60 mL/min    Comment: (NOTE) Calculated using the CKD-EPI Creatinine  Equation (2021)    Anion gap 11 5 - 15    Comment: Performed at [facility], [address] [place], [place] [address]  CBC with Differential/Platelet     Status: Abnormal   Collection Time: 08/16/23  5:53 AM  Result Value Ref Range   WBC 8.1 4.0 - 10.5 K/uL   RBC 4.74 3.87 - 5.11 MIL/uL   Hemoglobin 13.6 12.0 - 15.0 g/dL   HCT 46.9 62.9 - 52.8 %   MCV 90.7 80.0 - 100.0 fL   MCH 28.7 26.0 - 34.0 pg   MCHC 31.6 30.0 - 36.0 g/dL   RDW 41.3 (H) 24.4 - 01.0 %   Platelets 189 150 - 400 K/uL   nRBC 0.0 0.0 - 0.2 %   Neutrophils Relative % 88 %   Neutro Abs 7.1 1.7 - 7.7 K/uL   Lymphocytes Relative 6 %   Lymphs Abs 0.5 (L) 0.7 - 4.0 K/uL   Monocytes Relative 5 %   Monocytes Absolute 0.4 0.1 - 1.0 K/uL   Eosinophils Relative 0 %   Eosinophils Absolute 0.0 0.0 - 0.5 K/uL   Basophils Relative 0 %   Basophils Absolute 0.0 0.0 - 0.1 K/uL   Immature Granulocytes 1 %   Abs Immature Granulocytes 0.04 0.00 - 0.07 K/uL    Comment: Performed at [facility], [address], [place], [place] [address]  Magnesium     Status: None   Collection Time: 08/16/23  5:53 AM  Result  Value Ref Range   Magnesium 1.8 1.7 - 2.4 mg/dL    Comment: Performed at [facility], [address] [place], [place] [address]  TSH     Status: None   Collection Time: 08/16/23  5:53 AM  Result Value Ref Range   TSH 2.240 0.350 - 4.500 uIU/mL    Comment: Performed by a 3rd Generation assay with a functional sensitivity of <=0.01 uIU/mL. Performed at [facility], [address] [place], [place] [address]   Troponin I (High Sensitivity)     Status: Abnormal   Collection Time: 08/16/23  6:49 AM  Result Value Ref Range   Troponin I (High Sensitivity) 121 (HH) <18 ng/L    Comment: CRITICAL RESULT CALLED TO, READ BACK BY AND VERIFIED WITH BLIAR, I RN AT [phone] ON 08/15/2022 BY Deedra Ehrich, K (NOTE) Elevated high sensitivity troponin I (hsTnI) values  and significant  changes across serial measurements may suggest ACS but many other  chronic and acute conditions are known to elevate hsTnI results.  Refer to the "Links" section for chest pain algorithms and additional  guidance. Performed at [facility], [address], [place], [place] [address]    DG Chest Port 1 View Result Date: 08/15/2023 CLINICAL DATA:  Shortness of breath EXAM: PORTABLE CHEST 1 VIEW COMPARISON:  04/02/2023 FINDINGS: Moderate cardiomegaly with bilateral interstitial opacities likely mild pulmonary edema. Small left pleural effusion. IMPRESSION: Moderate cardiomegaly with mild pulmonary edema and small left pleural effusion. Electronically Signed   By: Deatra Robinson M.D.   On: 08/15/2023 23:57    Pending Labs Unresulted Labs (From admission, onward)     Start     Ordered   08/17/23 0500  Basic metabolic panel  Daily,   R     Comments: As Scheduled for 5 days    08/16/23 0505   08/16/23 0548  Hemoglobin A1c  Once,   R        08/16/23 0547            Vitals/Pain Today's Vitals   08/16/23 0419 08/16/23 0730 08/16/23 0742 08/16/23 0754  BP:  110/74    Pulse:  79 78   Resp:  20 17   Temp: 98.2 F (36.8 C)     TempSrc: Axillary     SpO2:  94% 93%   PainSc:    8     Isolation Precautions No active isolations  Medications Medications  amiodarone (PACERONE) tablet 200 mg (has no administration in time range)  ALPRAZolam (XANAX) tablet 1 mg (has no administration in time range)  methimazole (TAPAZOLE) tablet 5 mg (has no administration in time range)  docusate sodium (COLACE) capsule 100 mg (has no administration in time range)  apixaban (ELIQUIS) tablet 5 mg (has no administration in time range)  oxyCODONE-acetaminophen (PERCOCET/ROXICET) 5-325 MG per tablet 1 tablet (1 tablet Oral Given 08/16/23 0753)    And  oxyCODONE (Oxy IR/ROXICODONE) immediate release tablet 5 mg (has no administration in time range)  metoprolol succinate  (TOPROL-XL) 24 hr tablet 50 mg (has no administration in time range)  metoprolol succinate (TOPROL-XL) 24 hr tablet 100 mg (has no administration in time range)  potassium chloride 10 mEq in 100 mL IVPB (10 mEq Intravenous New Bag/Given 08/16/23 0741)  furosemide (LASIX) injection 80 mg (80 mg Intravenous Given 08/16/23 0033)    Mobility walks with person assist     Focused Assessments Cardiac Assessment Handoff:  Cardiac Rhythm: Atrial fibrillation Lab Results  Component Value Date   CKTOTAL 81 01/18/2022   CKMB 0.9 11/06/2010   TROPONINI <0.01        NO INDICATION OF MYOCARDIAL INJURY. 11/06/2010   No results found for: "DDIMER" Does the Patient currently have chest pain? No   , Pulmonary Assessment Handoff:  Lung sounds: Bilateral Breath Sounds: Diminished O2 Device: Room Air O2 Flow Rate (L/min): 3 L/min    R Recommendations: See Admitting Provider Note  Report given to:   Additional Notes:

## 2023-08-16 NOTE — Plan of Care (Signed)
DD is now on room air and has received electrolyte replacements (four runs of potassium and one of magnesium).  Pain is manageable with PRN Percocet and oxycodone.

## 2023-08-16 NOTE — Consult Note (Signed)
WOC Nurse Consult Note: Reason for Consult: toe wounds Patient self reports due to ill fitting shoes Wound type: trama Pressure Injury POA: NA Measurement: see nursing flow sheets Wound bed:see nursing flow sheets Drainage (amount, consistency, odor) see nursing flow sheets Periwound: intact  Dressing procedure/placement/frequency:  Cleanse toe wounds with saline, cover with single layer of xeroform gauze, top with foam or gauze. Change every other day   Re consult if needed, will not follow at this time. Thanks  Sage [employer], RN,CWOCN, CNS, CWON-[facility] [phone])

## 2023-08-17 DIAGNOSIS — N1831 Chronic kidney disease, stage 3a: Secondary | ICD-10-CM | POA: Diagnosis not present

## 2023-08-17 DIAGNOSIS — I5023 Acute on chronic systolic (congestive) heart failure: Secondary | ICD-10-CM | POA: Diagnosis not present

## 2023-08-17 DIAGNOSIS — I4811 Longstanding persistent atrial fibrillation: Secondary | ICD-10-CM

## 2023-08-17 DIAGNOSIS — J9601 Acute respiratory failure with hypoxia: Secondary | ICD-10-CM | POA: Diagnosis not present

## 2023-08-17 LAB — BASIC METABOLIC PANEL
Anion gap: 10 (ref 5–15)
BUN: 29 mg/dL — ABNORMAL HIGH (ref 8–23)
CO2: 29 mmol/L (ref 22–32)
Calcium: 9.4 mg/dL (ref 8.9–10.3)
Chloride: 97 mmol/L — ABNORMAL LOW (ref 98–111)
Creatinine, Ser: 0.99 mg/dL (ref 0.44–1.00)
GFR, Estimated: >60 mL/min (ref >60)
Glucose, Bld: 113 mg/dL — ABNORMAL HIGH (ref 70–99)
Potassium: 3.8 mmol/L (ref 3.5–5.1)
Sodium: 136 mmol/L (ref 135–145)

## 2023-08-17 LAB — MAGNESIUM: Magnesium: 2.2 mg/dL (ref 1.7–2.4)

## 2023-08-17 MED ORDER — DIGOXIN 0.25 MG/ML IJ SOLN
0.0625 mg | Freq: Three times a day (TID) | INTRAMUSCULAR | Status: AC
  Administered 2023-08-17 – 2023-08-18 (×2): 0.0625 mg via INTRAVENOUS
  Filled 2023-08-17 (×2): qty 2

## 2023-08-17 MED ORDER — DIGOXIN 0.25 MG/ML IJ SOLN
0.1250 mg | Freq: Once | INTRAMUSCULAR | Status: AC
  Administered 2023-08-17: 0.125 mg via INTRAVENOUS
  Filled 2023-08-17: qty 2

## 2023-08-17 MED ORDER — DM-GUAIFENESIN ER 30-600 MG PO TB12
1.0000 | ORAL_TABLET | Freq: Once | ORAL | Status: AC
  Administered 2023-08-17: 1 via ORAL
  Filled 2023-08-17: qty 1

## 2023-08-17 MED ORDER — FUROSEMIDE 10 MG/ML IJ SOLN
20.0000 mg | Freq: Once | INTRAMUSCULAR | Status: AC
  Administered 2023-08-17: 20 mg via INTRAVENOUS
  Filled 2023-08-17: qty 2

## 2023-08-17 NOTE — Progress Notes (Signed)
   Patient Name: Rhonda Marshall Date of Encounter: 08/17/2023 [facility] Cardiologist: Rollene Rotunda, MD   Interval Summary  .    Feeling better.  Breathing improving.   Vital Signs .    Vitals:   08/17/23 0700 08/17/23 0708 08/17/23 0800 08/17/23 1055  BP: 111/81   110/79  Pulse: (!) 119   (!) 109  Resp: (!) 26     Temp:   98.1 F (36.7 C)   TempSrc:   Oral   SpO2: 97%     Weight:  44.5 kg    Height:        Intake/Output Summary (Last 24 hours) at 08/17/2023 1203 Last data filed at 08/16/2023 1836 Gross per 24 hour  Intake 678.2 ml  Output 600 ml  Net 78.2 ml      08/17/2023    7:08 AM 08/16/2023    8:37 AM 08/05/2023    3:47 PM  Last 3 Weights  Weight (lbs) 98 lb 1.7 oz 100 lb 5 oz 115 lb 3.2 oz  Weight (kg) 44.5 kg 45.5 kg 52.254 kg      Telemetry/ECG    Atrial fibrillation.  Rate >100 bpm. - Personally Reviewed  Physical Exam .    VS:  BP 110/79   Pulse (!) 109   Temp 98.1 F (36.7 C) (Oral)   Resp (!) 26   Ht 5\' 1"  (1.549 m)   Wt 44.5 kg   SpO2 97%   BMI 18.54 kg/m  , BMI Body mass index is 18.54 kg/m. GENERAL:  Well appearing HEENT: Pupils equal round and reactive, fundi not visualized, oral mucosa unremarkable NECK:  No jugular venous distention, waveform within normal limits, carotid upstroke brisk and symmetric, no bruits, no thyromegaly LUNGS:  Clear to auscultation bilaterally HEART:  Tachycardic.  Irregularly irregular  PMI not displaced or sustained,S1 and S2 within normal limits, no S3, no S4, no clicks, no rubs, no murmurs ABD:  Flat, positive bowel sounds normal in frequency in pitch, no bruits, no rebound, no guarding, no midline pulsatile mass, no hepatomegaly, no splenomegaly EXT:  2 plus pulses throughout, no edema, no cyanosis no clubbing SKIN:  No rashes no nodules NEURO:  Cranial nerves II through XII grossly intact, motor grossly intact throughout PSYCH:  Cognitively intact, oriented to person place and  time   Assessment & Plan .     # Persistent atrial fibrillation with RVR:  Ms. Carreno remains in atrial fibrillation.  Rates are poorly controlled despite being on amiodarone and metoprolol.  Will add digoxin given her low blood pressure and systolic dysfunction.  Continue Eliquis.  # Acute on chronic systolic and diastolic heart failure:  Volume status seems to be improving.  Renal function continues to improve with diuresis.  Continue Lasix 40 mg IV twice daily.  Will likely transition to oral tomorrow.  Continue metoprolol succinate.  Blood pressure is too low for further titration of GDMT.  Given her history of UTIs no plans for SGLT2 inhibitor.   For questions or updates, please contact [facility] Please consult [email or web address] for contact info under        Signed, Chilton Si, MD

## 2023-08-17 NOTE — Plan of Care (Signed)

## 2023-08-17 NOTE — Hospital Course (Addendum)
72 yYOF W/ chronic HFrEF likely tachyarrhythmia/A-fib related, CKD IIIa b/l creat ! 1.1,hyperthyroidism to the ER with complaints of shortness of breath x [redacted] wk along with weakness, lost about 7 pounds, orthopnea and leg itching. She recently followed up with her cardiologist Dr. Antoine Poche on 08/05/2023 when patient was advised to take Lasix 20 milligrams daily for 3 days and her Toprol dose was increased. In WU:[id number] and dyspneic and was placed on BiPAP. CXR>pulmonary edema with pleural effusion. BNP>4500 troponins were 94 and 87.  WBC count 12.2 blood gas shows pH of 7.35 pCO2 of 44 Patient was admitted for further management, cardiology was consulted. She was diuresed with IV Lasix and clinically improving.

## 2023-08-17 NOTE — Progress Notes (Signed)
PROGRESS NOTE Rhonda Marshall  RUE:[id number] DOB: [birth year] DOA: 08/15/2023 PCP: Knox Royalty, MD  Brief Narrative/Hospital Course: 73 yYOF W/ chronic HFrEF likely tachyarrhythmia/A-fib related, CKD IIIa b/l creat ! 1.1,hyperthyroidism to the ER with complaints of shortness of breath x [redacted] wk along with weakness, lost about 7 pounds, orthopnea and leg itching. She recently followed up with her cardiologist Dr. Antoine Poche on 08/05/2023 when patient was advised to take Lasix 20 milligrams daily for 3 days and her Toprol dose was increased. In JY:NWGNFAO and dyspneic and was placed on BiPAP. CXR>pulmonary edema with pleural effusion. BNP>4500 troponins were 94 and 87.  WBC count 12.2 blood gas shows pH of 7.35 pCO2 of 44 Patient was admitted for further management, cardiology was consulted. She was diuresed with IV Lasix and clinically improving.    Subjective: FEELS much better today on RA Son at bedside No more leg swelling Heart rate is uncontrolled Assessment and Plan: Principal Problem:   Acute respiratory failure with hypoxia (HCC) Active Problems:   Acute on chronic systolic CHF (congestive heart failure) (HCC)   Longstanding persistent atrial fibrillation (HCC)   Rheumatoid arthritis (HCC)   Hyperthyroidism   Elevated LFTs   CKD (chronic kidney disease) stage 3, GFR 30-59 ml/min (HCC)   Acute hypoxic respiratory failure Acute on chronic HFrEF Acute pulmonary edema/pleural effusion: Patient with hypoxic respiratory failure secondary to CHF exacerbation w/ elevated BNP, cxr w/ PE/ ple effusiin with hx of LVEF on 9/24 in 30 to 35%.  Initially needing BiPAP, weaned off, on [place] and weaned off to room air.  Appreciate cardiology inputs. GDMT: Limited due to hypotension-on Entresto and Aldactone,, no SGLT 1 due to frequent UTI.  Continue Toprol. Monitor intake output Daily weight.  Diuresis per cardiology-likely transition to oral tomorrow  Permanent A-fib: Rate poorly controlled on  Toprol and amiodarone, anticoagulated on Eliquis.  Adjusting rate control meds per cardiology by adding digoxin today.  Elevated troponin IS demand ischemia due to chf. NO chest pain. No plan for ischemic eval.   Chronic kidney disease stage IIIa: creatinine at her baseline, stable.   Prediabetes: A1c 6.1   Elevated LFTs follow LFTs.? Congestive hepatopathy.   Hyperthyroidism: TSH normal.  Continue home methimazole   Chronic pain syndrome Anxiety:  Cont home percocet prn xanax   Hypokalemia: Hypomagnesemia: Replenished.  Pressure injury stage II left to POA, and also on left anterior unstageable ulcer on TOE- see below Pressure Injury 08/16/23 Toe (Comment  which one) Left;Anterior Stage 2 -  Partial thickness loss of dermis presenting as a shallow open injury with a red, pink wound bed without slough. (Active)  08/16/23 1308  Location: Toe (Comment  which one)  Location Orientation: Left;Anterior (Fourth toe)  Staging: Stage 2 -  Partial thickness loss of dermis presenting as a shallow open injury with a red, pink wound bed without slough.  Wound Description (Comments):   Present on Admission: Yes (Patient states "those are from my shoes I think")  Dressing Type Gauze (Comment) 08/17/23 0900     Pressure Injury 08/16/23 Toe (Comment  which one) Anterior;Left Unstageable - Full thickness tissue loss in which the base of the injury is covered by slough (yellow, tan, gray, green or brown) and/or eschar (tan, brown or black) in the wound bed. (Active)  08/16/23 6578  Location: Toe (Comment  which one)  Location Orientation: Anterior;Left (Second)  Staging: Unstageable - Full thickness tissue loss in which the base of the injury is covered by slough (yellow, tan, gray,  green or brown) and/or eschar (tan, brown or black) in the wound bed.  Wound Description (Comments):   Present on Admission: Yes (Patient states "those are from my shoes I think")   DVT prophylaxis: eliquis Code  Status:   Code Status: Full Code Family Communication: plan of care discussed with patient at bedside. Patient status is: Remains hospitalized because of severity of illness Level of care: Stepdown   Dispo: The patient is from: Home with son            Anticipated disposition: TBD. PTOT requested today Objective: Vitals last 24 hrs: Vitals:   08/17/23 0708 08/17/23 0800 08/17/23 1055 08/17/23 1200  BP:   110/79   Pulse:   (!) 109   Resp:      Temp:  98.1 F (36.7 C)  98 F (36.7 C)  TempSrc:  Oral  Oral  SpO2:      Weight: 44.5 kg     Height:       Weight change:   Physical Examination: General exam: alert awake,at baseline, 73 older than stated age HEENT:Oral mucosa moist, Ear/Nose WNL grossly Respiratory system: Bilaterally clear BS,no use of accessory muscle Cardiovascular system: S1 & S2 +, No JVD. Gastrointestinal system: Abdomen soft,NT,ND, BS+ Nervous System: Alert, awake, moving all extremities,and following commands. Extremities: LE edema neg,distal peripheral pulses palpable and warm.  Skin: No rashes,no icterus. MSK: Normal muscle bulk,tone, power   Medications reviewed:  Scheduled Meds:  amiodarone  200 mg Oral Daily   apixaban  5 mg Oral BID   Chlorhexidine Gluconate Cloth  6 each Topical Daily   digoxin  0.0625 mg Intravenous Q8H   digoxin  0.125 mg Intravenous Once   furosemide  40 mg Intravenous BID   methimazole  5 mg Oral Daily   metoprolol succinate  100 mg Oral Daily   metoprolol succinate  50 mg Oral QHS   Continuous Infusions:   Diet Order             Diet Heart Room service appropriate? Yes; Fluid consistency: Thin; Fluid restriction: 1200 mL Fluid  Diet effective now                            Intake/Output Summary (Last 24 hours) at 08/17/2023 1316 Last data filed at 08/16/2023 1836 Gross per 24 hour  Intake 287.93 ml  Output 600 ml  Net -312.07 ml   Net IO Since Admission: -1,821.8 mL [08/17/23 1316]  Wt Readings from Last  3 Encounters:  08/17/23 44.5 kg  08/05/23 52.3 kg  07/22/23 50.4 kg     Unresulted Labs (From admission, onward)     Start     Ordered   08/17/23 0500  Basic metabolic panel  Daily,   R     Comments: As Scheduled for 5 days    08/16/23 0505           Data Reviewed: I have personally reviewed following labs and imaging studies CBC: Recent Labs  Lab 08/16/23 0032 08/16/23 0553  WBC 12.2* 8.1  NEUTROABS 10.7* 7.1  HGB 15.0 13.6  HCT 47.2* 43.0  MCV 93.3 90.7  PLT 212 189   Basic Metabolic Panel:  Recent Labs  Lab 08/16/23 0032 08/16/23 0553 08/17/23 0235  NA 139 139 136  K 3.6 2.9* 3.8  CL 105 99 97*  CO2 20* 29 29  GLUCOSE 204* 111* 113*  BUN 28* 27* 29*  CREATININE 1.18* 1.14*  0.99  CALCIUM 9.3 9.0 9.4  MG  --  1.8 2.2   GFR: Estimated Creatinine Clearance: 35.6 mL/min (by C-G formula based on SCr of 0.99 mg/dL). Liver Function Tests:  Recent Labs  Lab 08/16/23 0032 08/16/23 0553  AST 48* 43*  ALT 28 26  ALKPHOS 157* 135*  BILITOT 1.1 1.5*  PROT 7.3 6.6  ALBUMIN 3.2* 2.9*   No results for input(s): "LIPASE", "AMYLASE" in the last 168 hours. No results for input(s): "AMMONIA" in the last 168 hours. Coagulation Profile: No results for input(s): "INR", "PROTIME" in the last 168 hours. No results for input(s): "PROBNP" in the last 168 hours.  Recent Labs    08/16/23 0553  HGBA1C 6.1*   No results for input(s): "GLUCAP" in the last 168 hours. No results for input(s): "CHOL", "HDL", "LDLCALC", "TRIG", "CHOLHDL", "LDLDIRECT" in the last 72 hours. Recent Labs    08/16/23 0553  TSH 2.240   Sepsis Labs: No results for input(s): "PROCALCITON", "LATICACIDVEN" in the last 168 hours. Recent Results (from the past 240 hours)  Resp panel by RT-PCR (RSV, Flu A&B, Covid) Anterior Nasal Swab     Status: None   Collection Time: 08/16/23  2:14 AM   Specimen: Anterior Nasal Swab  Result Value Ref Range Status   SARS Coronavirus 2 by RT PCR NEGATIVE NEGATIVE  Final    Comment: (NOTE) SARS-CoV-2 target nucleic acids are NOT DETECTED.  The SARS-CoV-2 RNA is generally detectable in upper respiratory specimens during the acute phase of infection. The lowest concentration of SARS-CoV-2 viral copies this assay can detect is 138 copies/mL. A negative result does not preclude SARS-Cov-2 infection and should not be used as the sole basis for treatment or other patient management decisions. A negative result may occur with  improper specimen collection/handling, submission of specimen other than nasopharyngeal swab, presence of viral mutation(s) within the areas targeted by this assay, and inadequate number of viral copies(<138 copies/mL). A negative result must be combined with clinical observations, patient history, and epidemiological information. The expected result is Negative.  Fact Sheet for Patients:  BloggerCourse.com  Fact Sheet for Healthcare Providers:  SeriousBroker.it  This test is no t yet approved or cleared by the [place] FDA and  has been authorized for detection and/or diagnosis of SARS-CoV-2 by FDA under an Emergency Use Authorization (EUA). This EUA will remain  in effect (meaning this test can be used) for the duration of the COVID-19 declaration under Section 564(b)(1) of the Act, 21 U.S.C.section 360bbb-3(b)(1), unless the authorization is terminated  or revoked sooner.       Influenza A by PCR NEGATIVE NEGATIVE Final   Influenza B by PCR NEGATIVE NEGATIVE Final    Comment: (NOTE) The Xpert Xpress SARS-CoV-2/FLU/RSV plus assay is intended as an aid in the diagnosis of influenza from Nasopharyngeal swab specimens and should not be used as a sole basis for treatment. Nasal washings and aspirates are unacceptable for Xpert Xpress SARS-CoV-2/FLU/RSV testing.  Fact Sheet for Patients: BloggerCourse.com  Fact Sheet for Healthcare  Providers: SeriousBroker.it  This test is not yet approved or cleared by the [place] FDA and has been authorized for detection and/or diagnosis of SARS-CoV-2 by FDA under an Emergency Use Authorization (EUA). This EUA will remain in effect (meaning this test can be used) for the duration of the COVID-19 declaration under Section 564(b)(1) of the Act, 21 U.S.C. section 360bbb-3(b)(1), unless the authorization is terminated or revoked.     Resp Syncytial Virus by PCR  NEGATIVE NEGATIVE Final    Comment: (NOTE) Fact Sheet for Patients: BloggerCourse.com  Fact Sheet for Healthcare Providers: SeriousBroker.it  This test is not yet approved or cleared by the [place] FDA and has been authorized for detection and/or diagnosis of SARS-CoV-2 by FDA under an Emergency Use Authorization (EUA). This EUA will remain in effect (meaning this test can be used) for the duration of the COVID-19 declaration under Section 564(b)(1) of the Act, 21 U.S.C. section 360bbb-3(b)(1), unless the authorization is terminated or revoked.  Performed at [facility], [address] [place], [place] [address]   MRSA Next Gen by PCR, Nasal     Status: None   Collection Time: 08/16/23  8:51 AM   Specimen: Nasal Mucosa; Nasal Swab  Result Value Ref Range Status   MRSA by PCR Next Gen NOT DETECTED NOT DETECTED Final    Comment: (NOTE) The GeneXpert MRSA Assay (FDA approved for NASAL specimens only), is one component of a comprehensive MRSA colonization surveillance program. It is not intended to diagnose MRSA infection nor to guide or monitor treatment for MRSA infections. Test performance is not FDA approved in patients less than 61 years old. Performed at [facility], [address] [place], [place] [address]     Antimicrobials/Microbiology: Anti-infectives (From admission, onward)     None         Component Value Date/Time   SDES  03/29/2023 1558    BLOOD BLOOD RIGHT ARM Performed at [facility], [address] [place], [place] [address]    SPECREQUEST  03/29/2023 1558    BOTTLES DRAWN AEROBIC AND ANAEROBIC Blood Culture adequate volume Performed at [facility], [address], [place], [place] [address]    CULT  03/29/2023 1558    NO GROWTH 5 DAYS Performed at [facility], [address] [place], [place] [address]    REPTSTATUS 04/03/2023 FINAL 03/29/2023 1558     Radiology Studies: [facility] Chest Port 1 View Result Date: 08/15/2023 CLINICAL DATA:  Shortness of breath EXAM: PORTABLE CHEST 1 VIEW COMPARISON:  04/02/2023 FINDINGS: Moderate cardiomegaly with bilateral interstitial opacities likely mild pulmonary edema. Small left pleural effusion. IMPRESSION: Moderate cardiomegaly with mild pulmonary edema and small left pleural effusion. Electronically Signed   By: Deatra Robinson M.D.   On: 08/15/2023 23:57     LOS: 1 day   Total time spent in review of labs and imaging, patient evaluation, formulation of plan, documentation and communication with family: 50 minutes  Lanae Boast, MD  [facility]  08/17/2023, 1:16 PM

## 2023-08-18 ENCOUNTER — Encounter (HOSPITAL_COMMUNITY): Payer: Self-pay | Admitting: Internal Medicine

## 2023-08-18 DIAGNOSIS — I4891 Unspecified atrial fibrillation: Secondary | ICD-10-CM | POA: Diagnosis not present

## 2023-08-18 DIAGNOSIS — J9601 Acute respiratory failure with hypoxia: Secondary | ICD-10-CM | POA: Diagnosis not present

## 2023-08-18 DIAGNOSIS — L8990 Pressure ulcer of unspecified site, unspecified stage: Secondary | ICD-10-CM | POA: Insufficient documentation

## 2023-08-18 DIAGNOSIS — I5023 Acute on chronic systolic (congestive) heart failure: Secondary | ICD-10-CM | POA: Diagnosis not present

## 2023-08-18 LAB — BASIC METABOLIC PANEL
Anion gap: 13 (ref 5–15)
BUN: 30 mg/dL — ABNORMAL HIGH (ref 8–23)
CO2: 31 mmol/L (ref 22–32)
Calcium: 8.9 mg/dL (ref 8.9–10.3)
Chloride: 88 mmol/L — ABNORMAL LOW (ref 98–111)
Creatinine, Ser: 1.09 mg/dL — ABNORMAL HIGH (ref 0.44–1.00)
GFR, Estimated: 54 mL/min — ABNORMAL LOW (ref >60)
Glucose, Bld: 115 mg/dL — ABNORMAL HIGH (ref 70–99)
Potassium: 3.1 mmol/L — ABNORMAL LOW (ref 3.5–5.1)
Sodium: 132 mmol/L — ABNORMAL LOW (ref 135–145)

## 2023-08-18 MED ORDER — POTASSIUM CHLORIDE CRYS ER 20 MEQ PO TBCR
40.0000 meq | EXTENDED_RELEASE_TABLET | ORAL | Status: AC
  Administered 2023-08-18 (×2): 40 meq via ORAL
  Filled 2023-08-18 (×2): qty 2

## 2023-08-18 MED ORDER — FUROSEMIDE 40 MG PO TABS
40.0000 mg | ORAL_TABLET | Freq: Every day | ORAL | Status: DC
  Administered 2023-08-18 – 2023-08-19 (×2): 40 mg via ORAL
  Filled 2023-08-18 (×2): qty 1

## 2023-08-18 MED ORDER — DIGOXIN 0.1 MG/ML IJ SOLN
0.0625 mg | Freq: Once | INTRAMUSCULAR | Status: AC
  Administered 2023-08-18: 0.0625 mg via INTRAVENOUS
  Filled 2023-08-18: qty 0.63

## 2023-08-18 MED ORDER — METOPROLOL SUCCINATE ER 50 MG PO TB24
50.0000 mg | ORAL_TABLET | Freq: Two times a day (BID) | ORAL | Status: DC
  Administered 2023-08-18 – 2023-08-19 (×2): 50 mg via ORAL
  Filled 2023-08-18 (×2): qty 2

## 2023-08-18 MED ORDER — DIGOXIN 125 MCG PO TABS
0.1250 mg | ORAL_TABLET | Freq: Every day | ORAL | Status: DC
  Administered 2023-08-19: 0.125 mg via ORAL
  Filled 2023-08-18: qty 1

## 2023-08-18 NOTE — Progress Notes (Signed)
   Patient Name: Rhonda Marshall Date of Encounter: 08/18/2023 [facility] Cardiologist: Lynwood Schilling, MD    Interval Summary  .    Patient reports feeling well this AM. Denies chest pain, palpitations. Her breathing is back to normal. No ankle swelling.   Vital Signs .    Vitals:   08/18/23 0359 08/18/23 0700 08/18/23 0834 08/18/23 0900  BP:  111/75 116/75 105/87  Pulse:  84 84 85  Resp:  19 (!) 21 18  Temp: 98.7 F (37.1 C)     TempSrc: Oral     SpO2:  98% 98% 97%  Weight:      Height:        Intake/Output Summary (Last 24 hours) at 08/18/2023 0919 Last data filed at 08/18/2023 0302 Gross per 24 hour  Intake 390 ml  Output 2075 ml  Net -1685 ml      08/17/2023    7:08 AM 08/16/2023    8:37 AM 08/05/2023    3:47 PM  Last 3 Weights  Weight (lbs) 98 lb 1.7 oz 100 lb 5 oz 115 lb 3.2 oz  Weight (kg) 44.5 kg 45.5 kg 52.254 kg      Telemetry/ECG    Atrial fibrillation, HR in the 80s - Personally Reviewed  Physical Exam .   GEN: No acute distress. Thin, frail elderly female. Sitting upright in the bed  Neck: No JVD Cardiac: Irregular rate and rhythm. no murmurs, rubs, or gallops.  Respiratory: Crackles in bilateral lung bases. Normal WOB on [place]  GI: Soft, nontender, non-distended  MS: No edema in BLE  Assessment & Plan .     Persistent atrial fibrillation with RVR  - Currently on metoprolol  100 mg every AM, 50 mg every PM. Also on amiodarone  200 mg daily  - Was given IV digoxin  yesterday.  - Per telemetry, HR has been in the 80s. She is asymptomatic when in afib  - Continue metoprolol , amiodarone . I do not think she needs digoxin  today  - Continue eliquis  5 mg BID - this is the correct dose for her   Acute on chronic systolic and diastolic heart failure  - Echocardiogram from 03/2023 showed EF 30-35%, global hypokinesis, normal RV function, severe LA dilation and moderate RA dilation, mild MR - Presented with shortness of breath. BNP >4500. CXR showed mild  pulmonary edema and small left pleural effusion  - She has been on IV lasix  40 mg BID- output 2.075 L urine yesterday. Renal function stable  - Appears euvolemic on exam today. Stop IV lasix  and start PO lasix  40 mg daily  - Continue metoprolol  succinate 100 mg every AM, 50 mg PM   - Unable to add SGLT2i with frequent UTIs. Unable to add additional GDMT with low BP  Hypokalemia  - K 3.1 today  - supplementation ordered by primary team    For questions or updates, please contact [facility] Please consult [email or web address] for contact info under        Signed, Rollo FABIENE Louder, PA-C

## 2023-08-18 NOTE — Evaluation (Addendum)
 Occupational Therapy Evaluation Patient Details Name: Rhonda Marshall MRN: [id number] DOB: Sep 19, 1950 Today's Date: 08/18/2023   History of Present Illness Patient is a 73 year old female who presented on 2/1 with SOB, weakness, itchy legs and 7lb weight loss. Patient was admitted with acute hypoxic respiratory failure, acute chronic HFrEF, acute pulmonary edema/pleural effusion, PMH: prediabetes, permanent a fib, hypokalemia.   Clinical Impression   Patient is a 73 year old female who was admitted for above. Patient was living at home with son with unclear level of assistance as patient was hyper verbose and tangential during session. Patient was +1 with RW for transfers with increased cues for safety to avoid obstacles on L side in hallway. Patient needed consistent safety cues. Patient was noted to have decreased functional activity tolerance, decreased endurance, decreased standing balance, decreased safety awareness, and decreased knowledge of AD/AE impacting participation in ADLs. Patient would need 24/7 caregiver support in next level of care. Patient would continue to benefit from skilled OT services at this time while admitted and after d/c to address noted deficits in order to improve overall safety and independence in ADLs.        If plan is discharge home, recommend the following: A lot of help with bathing/dressing/bathroom;Assistance with cooking/housework;Direct supervision/assist for medications management;Assist for transportation;Help with stairs or ramp for entrance;Direct supervision/assist for financial management;A lot of help with walking and/or transfers    Functional Status Assessment  Patient has had a recent decline in their functional status and demonstrates the ability to make significant improvements in function in a reasonable and predictable amount of time.  Equipment Recommendations  None recommended by OT       Precautions / Restrictions  Precautions Precautions: Fall Restrictions Weight Bearing Restrictions Per Provider Order: No      Mobility Bed Mobility Overal bed mobility: Needs Assistance Bed Mobility: Supine to Sit     Supine to sit: Supervision     General bed mobility comments: with A for lines            Balance Overall balance assessment: Mild deficits observed, not formally tested               ADL either performed or assessed with clinical judgement   ADL Overall ADL's : Needs assistance/impaired Eating/Feeding: Supervision/ safety;Sitting   Grooming: Sitting;Brushing hair;Set up Grooming Details (indicate cue type and reason): personal brush Upper Body Bathing: Sitting;Minimal assistance   Lower Body Bathing: Sitting/lateral leans;Moderate assistance   Upper Body Dressing : Sitting;Minimal assistance   Lower Body Dressing: Moderate assistance;Sitting/lateral leans   Toilet Transfer: Contact guard assist;Ambulation;Rolling walker (2 wheels)   Toileting- Clothing Manipulation and Hygiene: Sitting/lateral lean;Minimal assistance                Pertinent Vitals/Pain Pain Assessment Pain Assessment: 0-10 Pain Score: 7  Pain Location: neck/back Pain Descriptors / Indicators: Constant Pain Intervention(s): Monitored during session     Extremity/Trunk Assessment Upper Extremity Assessment Upper Extremity Assessment: RUE deficits/detail RUE Deficits / Details: noted to have fisted resting position with patient able to open hand up. patient noted to have thumb resting between index and middle. noted to have signs of arthritis in digits and hands.   Lower Extremity Assessment Lower Extremity Assessment: Defer to PT evaluation   Cervical / Trunk Assessment Cervical / Trunk Assessment: Kyphotic   Communication Communication Communication: Hearing impairment   Cognition Arousal: Alert Behavior During Therapy: Restless Overall Cognitive Status: Difficult to assess  General Comments: patient was hyperverbose during session speaking off topic tangentially.                Home Living Family/patient expects to be discharged to:: Private residence Living Arrangements: Children Available Help at Discharge: Family (son) Type of Home: House Home Access: Stairs to enter [employer] of Steps: 3 Entrance Stairs-Rails: Can reach both Home Layout: Two level;Able to live on main level with bedroom/bathroom     Bathroom Shower/Tub: Producer, Television/film/video: Standard     Home Equipment: Pharmacist, Hospital (2 wheels);BSC/3in1          Prior Functioning/Environment Prior Level of Function : Independent/Modified Independent                        OT Problem List: Decreased strength;Decreased knowledge of use of DME or AE;Cardiopulmonary status limiting activity;Decreased safety awareness;Decreased knowledge of precautions;Pain      OT Treatment/Interventions: Self-care/ADL training;DME and/or AE instruction;Therapeutic activities;Balance training;Therapeutic exercise;Patient/family education;Energy conservation    OT Goals(Current goals can be found in the care plan section) Acute Rehab OT Goals OT Goal Formulation: With patient/family Time For Goal Achievement: 09/01/23 Potential to Achieve Goals: Fair  OT Frequency: Min 1X/week    Co-evaluation PT/OT/SLP Co-Evaluation/Treatment: Yes Reason for Co-Treatment: For patient/therapist safety;To address functional/ADL transfers PT goals addressed during session: Mobility/safety with mobility OT goals addressed during session: ADL's and self-care      AM-PAC OT 6 Clicks Daily Activity     Outcome Measure Help from another person eating meals?: A Little Help from another person taking care of personal grooming?: A Little Help from another person toileting, which includes using toliet, bedpan, or urinal?: A Lot Help from another person bathing  (including washing, rinsing, drying)?: A Lot Help from another person to put on and taking off regular upper body clothing?: A Little Help from another person to put on and taking off regular lower body clothing?: A Lot 6 Click Score: 15   End of Session Equipment Utilized During Treatment: Gait belt;Rolling walker (2 wheels) Nurse Communication: Mobility status  Activity Tolerance: Patient tolerated treatment well Patient left: in chair;with call bell/phone within reach;with chair alarm set;with family/visitor present  OT Visit Diagnosis: Unsteadiness on feet (R26.81);Other abnormalities of gait and mobility (R26.89);Muscle weakness (generalized) (M62.81)                Time: 9055-8989 OT Time Calculation (min): 26 min Charges:  OT General Charges $OT Visit: 1 Visit OT Evaluation $OT Eval Moderate Complexity: 1 Mod  Clydie Dillen OTR/L, MS Acute Rehabilitation Department Office# [phone]   Geofm CHRISTELLA Dance 08/18/2023, 1:11 PM

## 2023-08-18 NOTE — Progress Notes (Signed)
 PROGRESS NOTE Raschelle Wisenbaker  FMW:[id number] DOB: September 14, 1950 DOA: 08/15/2023 PCP: Joshua Francisco, MD  Brief Narrative/Hospital Course: 73 yYOF W/ chronic HFrEF likely tachyarrhythmia/A-fib related, CKD IIIa b/l creat ! 1.1,hyperthyroidism to the ER with complaints of shortness of breath x [redacted] wk along with weakness, lost about 7 pounds, orthopnea and leg itching. She recently followed up with her cardiologist Dr. Lavona on 08/05/2023 when patient was advised to take Lasix  20 milligrams daily for 3 days and her Toprol  dose was increased. In ZI:ybenkpr and dyspneic and was placed on BiPAP. CXR>pulmonary edema with pleural effusion. BNP>4500 troponins were 94 and 87.  WBC count 12.2 blood gas shows pH of 7.35 pCO2 of 44 Patient was admitted for further management, cardiology was consulted. She was diuresed with IV Lasix  and clinically improving.  Subjective: Seen and examined this morning On 2 L [place] Heart rate better controlled-labs with hypokalemia 3.1  Assessment and Plan: Principal Problem:   Acute respiratory failure with hypoxia (HCC) Active Problems:   Acute on chronic systolic CHF (congestive heart failure) (HCC)   Longstanding persistent atrial fibrillation (HCC)   Rheumatoid arthritis (HCC)   Hyperthyroidism   Elevated LFTs   CKD (chronic kidney disease) stage 3, GFR 30-59 ml/min (HCC)   Acute hypoxic respiratory failure Acute on chronic HFrEF Acute pulmonary edema/pleural effusion: Patient with hypoxic respiratory failure secondary to CHF exacerbation w/ elevated BNP, cxr w/ PE/ ple effusiin with hx of LVEF on 9/24 in 30 to 35%.  Initially needing BiPAP, weaned off, on -try to wean off to room air today and ambulate, appreciate cardiology inputs.  GDMT: Limited due to hypotension-not on Entresto  and Aldactone ,no SGLT 1 due to frequent UTI.  Continue Toprol . Monitor intake output Daily weight.  Diuresis per cardiology-likely transition to PO today.   Net IO Since Admission:  -3,506.8 mL [08/18/23 0912]   Hypokalemia: Replacing This morning  Permanent A-fib: Rate better controlled after adding digoxin  2/3 , continue amiodarone , Toprol .  Eliquis  for anticoagulation   Elevated troponin IS demand ischemia due to chf. NO chest pain. No plan for ischemic eval.   Chronic kidney disease stage IIIa: creatinine stable. Monitor while on Lasix .   Prediabetes: A1c 6.1. Blood sugar controlled Recent Labs  Lab 08/16/23 0553  HGBA1C 6.1*      Elevated LFTs follow LFTs.? Congestive hepatopathy.   Hyperthyroidism: TSH normal.  Continue home methimazole    Chronic pain syndrome Anxiety:  Mood stable continue her  percocet prn xanax    Pressure injury stage II left to POA, and also on left anterior unstageable ulcer on TOE- see below Pressure Injury 08/16/23 Toe (Comment  which one) Left;Anterior Stage 2 -  Partial thickness loss of dermis presenting as a shallow open injury with a red, pink wound bed without slough. (Active)  08/16/23 9085  Location: Toe (Comment  which one)  Location Orientation: Left;Anterior (Fourth toe)  Staging: Stage 2 -  Partial thickness loss of dermis presenting as a shallow open injury with a red, pink wound bed without slough.  Wound Description (Comments):   Present on Admission: Yes (Patient states those are from my shoes I think)  Dressing Type Gauze (Comment) 08/17/23 0900     Pressure Injury 08/16/23 Toe (Comment  which one) Anterior;Left Unstageable - Full thickness tissue loss in which the base of the injury is covered by slough (yellow, tan, gray, green or brown) and/or eschar (tan, brown or black) in the wound bed. (Active)  08/16/23 9141  Location: Toe (Comment  which one)  Location Orientation: Anterior;Left (Second)  Staging: Unstageable - Full thickness tissue loss in which the base of the injury is covered by slough (yellow, tan, gray, green or brown) and/or eschar (tan, brown or black) in the wound bed.  Wound  Description (Comments):   Present on Admission: Yes (Patient states those are from my shoes I think)   DVT prophylaxis: eliquis  Code Status:   Code Status: Full Code Family Communication: plan of care discussed with patient at bedside. Patient status is: Remains hospitalized because of severity of illness Level of care: Telemetry   Dispo: The patient is from: Home with son            Anticipated disposition: Continue PT OT anticipate discharge in next 24 hours Objective: Vitals last 24 hrs: Vitals:   08/18/23 0359 08/18/23 0700 08/18/23 0834 08/18/23 0900  BP:  111/75 116/75 105/87  Pulse:  84 84 85  Resp:  19 (!) 21 18  Temp: 98.7 F (37.1 C)     TempSrc: Oral     SpO2:  98% 98% 97%  Weight:      Height:       Weight change: -1 kg  Physical Examination: General exam: alert awake, oriented at baseline, older than stated age HEENT:Oral mucosa moist, Ear/Nose WNL grossly Respiratory system: Bilaterally crackles in lower lungs bilaterally,no use of accessory muscle Cardiovascular system: S1 & S2 +, No JVD. Gastrointestinal system: Abdomen soft,NT,ND, BS+ Nervous System: Alert, awake, moving all extremities,and following commands. Extremities: LE edema neg,distal peripheral pulses palpable and warm.  Skin: No rashes,no icterus. MSK: Normal muscle bulk,tone, power   Medications reviewed:  Scheduled Meds:  amiodarone   200 mg Oral Daily   apixaban   5 mg Oral BID   Chlorhexidine  Gluconate Cloth  6 each Topical Daily   furosemide   40 mg Intravenous BID   methimazole   5 mg Oral Daily   metoprolol  succinate  100 mg Oral Daily   metoprolol  succinate  50 mg Oral QHS   potassium chloride   40 mEq Oral Q3H   Continuous Infusions:   Diet Order             Diet Heart Room service appropriate? Yes; Fluid consistency: Thin; Fluid restriction: 1200 mL Fluid  Diet effective now                   Intake/Output Summary (Last 24 hours) at 08/18/2023 0911 Last data filed at  08/18/2023 0302 Gross per 24 hour  Intake 390 ml  Output 2075 ml  Net -1685 ml   Net IO Since Admission: -3,506.8 mL [08/18/23 0911]  Wt Readings from Last 3 Encounters:  08/17/23 44.5 kg  08/05/23 52.3 kg  07/22/23 50.4 kg     Unresulted Labs (From admission, onward)     Start     Ordered   08/17/23 0500  Basic metabolic panel  Daily,   R     Comments: As Scheduled for 5 days    08/16/23 0505           Data Reviewed: I have personally reviewed following labs and imaging studies CBC: Recent Labs  Lab 08/16/23 0032 08/16/23 0553  WBC 12.2* 8.1  NEUTROABS 10.7* 7.1  HGB 15.0 13.6  HCT 47.2* 43.0  MCV 93.3 90.7  PLT 212 189   Basic Metabolic Panel:  Recent Labs  Lab 08/16/23 0032 08/16/23 0553 08/17/23 0235 08/18/23 0321  NA 139 139 136 132*  K 3.6 2.9* 3.8 3.1*  CL 105  99 97* 88*  CO2 20* 29 29 31   GLUCOSE 204* 111* 113* 115*  BUN 28* 27* 29* 30*  CREATININE 1.18* 1.14* 0.99 1.09*  CALCIUM 9.3 9.0 9.4 8.9  MG  --  1.8 2.2  --    GFR: Estimated Creatinine Clearance: 32.3 mL/min (A) (by C-G formula based on SCr of 1.09 mg/dL (H)). Liver Function Tests:  Recent Labs  Lab 08/16/23 0032 08/16/23 0553  AST 48* 43*  ALT 28 26  ALKPHOS 157* 135*  BILITOT 1.1 1.5*  PROT 7.3 6.6  ALBUMIN  3.2* 2.9*  Sepsis Labs: No results for input(s): PROCALCITON, LATICACIDVEN in the last 168 hours. Recent Results (from the past 240 hours)  Resp panel by RT-PCR (RSV, Flu A&B, Covid) Anterior Nasal Swab     Status: None   Collection Time: 08/16/23  2:14 AM   Specimen: Anterior Nasal Swab  Result Value Ref Range Status   SARS Coronavirus 2 by RT PCR NEGATIVE NEGATIVE Final    Comment: (NOTE) SARS-CoV-2 target nucleic acids are NOT DETECTED.  The SARS-CoV-2 RNA is generally detectable in upper respiratory specimens during the acute phase of infection. The lowest concentration of SARS-CoV-2 viral copies this assay can detect is 138 copies/mL. A negative result does  not preclude SARS-Cov-2 infection and should not be used as the sole basis for treatment or other patient management decisions. A negative result may occur with  improper specimen collection/handling, submission of specimen other than nasopharyngeal swab, presence of viral mutation(s) within the areas targeted by this assay, and inadequate number of viral copies(<138 copies/mL). A negative result must be combined with clinical observations, patient history, and epidemiological information. The expected result is Negative.  Fact Sheet for Patients:  bloggercourse.com  Fact Sheet for Healthcare Providers:  seriousbroker.it  This test is no t yet approved or cleared by the [place]  FDA and  has been authorized for detection and/or diagnosis of SARS-CoV-2 by FDA under an Emergency Use Authorization (EUA). This EUA will remain  in effect (meaning this test can be used) for the duration of the COVID-19 declaration under Section 564(b)(1) of the Act, 21 U.S.C.section 360bbb-3(b)(1), unless the authorization is terminated  or revoked sooner.       Influenza A by PCR NEGATIVE NEGATIVE Final   Influenza B by PCR NEGATIVE NEGATIVE Final    Comment: (NOTE) The Xpert Xpress SARS-CoV-2/FLU/RSV plus assay is intended as an aid in the diagnosis of influenza from Nasopharyngeal swab specimens and should not be used as a sole basis for treatment. Nasal washings and aspirates are unacceptable for Xpert Xpress SARS-CoV-2/FLU/RSV testing.  Fact Sheet for Patients: bloggercourse.com  Fact Sheet for Healthcare Providers: seriousbroker.it  This test is not yet approved or cleared by the [place]  FDA and has been authorized for detection and/or diagnosis of SARS-CoV-2 by FDA under an Emergency Use Authorization (EUA). This EUA will remain in effect (meaning this test can be used) for the  duration of the COVID-19 declaration under Section 564(b)(1) of the Act, 21 U.S.C. section 360bbb-3(b)(1), unless the authorization is terminated or revoked.     Resp Syncytial Virus by PCR NEGATIVE NEGATIVE Final    Comment: (NOTE) Fact Sheet for Patients: bloggercourse.com  Fact Sheet for Healthcare Providers: seriousbroker.it  This test is not yet approved or cleared by the [place]  FDA and has been authorized for detection and/or diagnosis of SARS-CoV-2 by FDA under an Emergency Use Authorization (EUA). This EUA will remain in effect (meaning this test can be  used) for the duration of the COVID-19 declaration under Section 564(b)(1) of the Act, 21 U.S.C. section 360bbb-3(b)(1), unless the authorization is terminated or revoked.  Performed at [facility], [address] [place], [place] [address]   MRSA Next Gen by PCR, Nasal     Status: None   Collection Time: 08/16/23  8:51 AM   Specimen: Nasal Mucosa; Nasal Swab  Result Value Ref Range Status   MRSA by PCR Next Gen NOT DETECTED NOT DETECTED Final    Comment: (NOTE) The GeneXpert MRSA Assay (FDA approved for NASAL specimens only), is one component of a comprehensive MRSA colonization surveillance program. It is not intended to diagnose MRSA infection nor to guide or monitor treatment for MRSA infections. Test performance is not FDA approved in patients less than 75 years old. Performed at [facility], [address], [place], [place] [address]     Antimicrobials/Microbiology: Anti-infectives (From admission, onward)    None         Component Value Date/Time   SDES  03/29/2023 1558    BLOOD BLOOD RIGHT ARM Performed at [facility], [address] [place], [place] [address]    SPECREQUEST  03/29/2023 1558    BOTTLES DRAWN AEROBIC AND ANAEROBIC Blood Culture adequate volume Performed at [facility], [address], [place], [place] [address]    CULT  03/29/2023 1558    NO GROWTH 5 DAYS Performed at [facility], [address], [place], [place] [address]    REPTSTATUS 04/03/2023 FINAL 03/29/2023 1558     Radiology Studies: No results found.    LOS: 2 days   Total time spent in review of labs and imaging, patient evaluation, formulation of plan, documentation and communication with family: 35 minutes  Mennie LAMY, MD  [facility]  08/18/2023, 9:11 AM

## 2023-08-18 NOTE — Plan of Care (Signed)

## 2023-08-18 NOTE — Progress Notes (Signed)
Pt resting, no increased wob / respiratory distress noted or voiced by patient.  HR97, RR14, spo2 94% on 2l [place].  Bipap not indicated at this time.

## 2023-08-18 NOTE — Progress Notes (Signed)
 Heart Failure Nurse Navigator Progress Note  PCP: Joshua Francisco, MD PCP-Cardiologist: Hochrein Admission Diagnosis: Acute respiratory failure with hypoxia, Acute on chronic combined systolic and diastolic congestive heart failure.  Admitted from: Home  Presentation:   Rhonda Marshall presented with shortness of breath x 2 days, increased swelling in her legs, was given lasix  x 3 days from her cardiologist, however she continued to have SOB. Patient currently living with her son and daughter in law after her recent  fall and hip fracture. BP 131/118, HR 118, BNP >4,500, CXR shows pulmonary edema with pleural effusion,  Patient at [facility], called and spoke with daughter in law Rhonda Marshall who is patients care giver and she lives with her. Education was done on the sign and symptoms of heart failure, daily weights, when to call her doctor or go to the ED,Diet/ fluid restrictions, taking all medications as prescribed and attending all medical appointments. Patients DIL verbalized her understanding of education, a HF TOC appointment was scheduled for 08/28/2023 @ 12 noon.    ECHO/ LVEF: 30-35%  Clinical Course:  Past Medical History:  Diagnosis Date   Anxiety    Atrial fibrillation (HCC)    newly diagnosed 10/2010   Family history of adverse reaction to anesthesia    Sister's BP drops    Hip fracture (HCC) 01/18/2022   Hypertension    Rheumatoid arthritis(714.0)    Subclinical hyperthyroidism      Social History   Socioeconomic History   Marital status: Widowed    Spouse name: Not on file   Number of children: 1   Years of education: Not on file   Highest education level: Not on file  Occupational History   Occupation: laborer    Comment: [employer]   Occupation: retired  Tobacco Use   Smoking status: Former    Current packs/day: 0.00    Average packs/day: 0.3 packs/day for 54.2 years (13.6 ttl pk-yrs)    Types: Cigarettes    Start date: 50    Quit date: 10/2019     Years since quitting: 3.8    Passive exposure: Past   Smokeless tobacco: Never  Vaping Use   Vaping status: Never Used  Substance and Sexual Activity   Alcohol use: Not Currently    Alcohol/week: 0.0 standard drinks of alcohol   Drug use: No   Sexual activity: Not on file  Other Topics Concern   Not on file  Social History Narrative   Not on file   Social Drivers of Health   Financial Resource Strain: Low Risk  (08/18/2023)   Overall Financial Resource Strain (CARDIA)    Difficulty of Paying Living Expenses: Not very hard  Food Insecurity: Patient Declined (08/17/2023)   Hunger Vital Sign    Worried About Running Out of Food in the Last Year: Patient declined    Ran Out of Food in the Last Year: Patient declined  Transportation Needs: Patient Declined (08/17/2023)   PRAPARE - Administrator, Civil Service (Medical): Patient declined    Lack of Transportation (Non-Medical): Patient declined  Physical Activity: Not on file  Stress: Not on file  Social Connections: Moderately Integrated (08/17/2023)   Social Connection and Isolation Panel [NHANES]    Frequency of Communication with Friends and Family: Once a week    Frequency of Social Gatherings with Friends and Family: More than three times a week    Attends Religious Services: 1 to 4 times per year    Active Member of  Clubs or Organizations: No    Attends Banker Meetings: 1 to 4 times per year    Marital Status: Widowed   Education Assessment and Provision:  Detailed education and instructions provided on heart failure disease management including the following:  Signs and symptoms of Heart Failure When to call the physician Importance of daily weights Low sodium diet Fluid restriction Medication management Anticipated future follow-up appointments  Patient education given on each of the above topics.  Patient acknowledges understanding via teach back method and acceptance of all  instructions.  Education Materials:  Living Better With Heart Failure Booklet, HF zone tool, & Daily Weight Tracker Tool.  Patient has scale at home: Yes Patient has pill box at home: yes, daughter in law Rhonda Marshall makes up her medications for her every day.     High Risk Criteria for Readmission and/or Poor Patient Outcomes: Heart failure hospital admissions (last 6 months): 2  No Show rate: 5% Difficult social situation: No, currently living with her son and daughter in law.  Demonstrates medication adherence: yes Primary Language: English Literacy level: Reading, writing, and comprehension  Barriers of Care:   Per daughter in law: Diet/ fluid restrictions/ daily weights Medication compliance Smoking cessation  Considerations/Referrals:   Referral made to Heart Failure Pharmacist Stewardship: No Referral made to Heart Failure CSW/NCM TOC: No Referral made to Heart & Vascular TOC clinic: Yes, 08/28/2023 @ 12 noon   Items for Follow-up on DC/TOC: Medication compliance Diet/ fluid restrictions/ daily weights Continued HF education   Stephane Haddock, BSN, RN Heart Failure Teacher, Adult Education Only

## 2023-08-19 ENCOUNTER — Other Ambulatory Visit: Payer: Self-pay | Admitting: Cardiology

## 2023-08-19 ENCOUNTER — Other Ambulatory Visit (HOSPITAL_COMMUNITY): Payer: Self-pay

## 2023-08-19 ENCOUNTER — Other Ambulatory Visit: Payer: Self-pay

## 2023-08-19 DIAGNOSIS — J9601 Acute respiratory failure with hypoxia: Secondary | ICD-10-CM | POA: Diagnosis not present

## 2023-08-19 DIAGNOSIS — I4891 Unspecified atrial fibrillation: Secondary | ICD-10-CM | POA: Diagnosis not present

## 2023-08-19 DIAGNOSIS — I4819 Other persistent atrial fibrillation: Secondary | ICD-10-CM

## 2023-08-19 DIAGNOSIS — Z79899 Other long term (current) drug therapy: Secondary | ICD-10-CM

## 2023-08-19 LAB — BASIC METABOLIC PANEL
Anion gap: 10 (ref 5–15)
BUN: 30 mg/dL — ABNORMAL HIGH (ref 8–23)
CO2: 30 mmol/L (ref 22–32)
Calcium: 9.2 mg/dL (ref 8.9–10.3)
Chloride: 89 mmol/L — ABNORMAL LOW (ref 98–111)
Creatinine, Ser: 0.83 mg/dL (ref 0.44–1.00)
GFR, Estimated: >60 mL/min (ref >60)
Glucose, Bld: 107 mg/dL — ABNORMAL HIGH (ref 70–99)
Potassium: 4 mmol/L (ref 3.5–5.1)
Sodium: 129 mmol/L — ABNORMAL LOW (ref 135–145)

## 2023-08-19 MED ORDER — FUROSEMIDE 40 MG PO TABS
40.0000 mg | ORAL_TABLET | Freq: Every day | ORAL | 0 refills | Status: DC
  Filled 2023-08-19: qty 30, 30d supply, fill #0

## 2023-08-19 MED ORDER — DIGOXIN 125 MCG PO TABS
0.1250 mg | ORAL_TABLET | Freq: Every day | ORAL | 0 refills | Status: DC
  Filled 2023-08-19 (×2): qty 30, 30d supply, fill #0

## 2023-08-19 MED ORDER — METOPROLOL SUCCINATE ER 50 MG PO TB24
50.0000 mg | ORAL_TABLET | Freq: Two times a day (BID) | ORAL | 0 refills | Status: DC
  Filled 2023-08-19: qty 60, 30d supply, fill #0

## 2023-08-19 NOTE — Plan of Care (Signed)

## 2023-08-19 NOTE — TOC Initial Note (Signed)
 Transition of Care [facility]) - Initial/Assessment Note    Patient Details  Name: Rhonda Marshall MRN: [id number] Date of Birth: [birth year]  Transition of [facility] CM/SW Contact:    Tawni CHRISTELLA Eva, LCSW Phone Number: 08/19/2023, 10:22 AM  Clinical Narrative:                 Pt rec for PT evaluation. TOC to follow for rec.  Expected Discharge Plan:  (TBD) Barriers to Discharge: Continued Medical Work up   Patient Goals and CMS Choice            Expected Discharge Plan and Services                                              Prior Living Arrangements/Services                       Activities of Daily Living   ADL Screening (condition at time of admission) Independently performs ADLs?: Yes (appropriate for developmental age) Is the patient deaf or have difficulty hearing?: Yes Does the patient have difficulty seeing, even when wearing glasses/contacts?: No Does the patient have difficulty concentrating, remembering, or making decisions?: Yes  Permission Sought/Granted                  Emotional Assessment              Admission diagnosis:  Acute CHF (congestive heart failure) (HCC) [I50.9] Acute on chronic combined systolic and diastolic congestive heart failure (HCC) [I50.43] Acute respiratory failure with hypoxia (HCC) [J96.01] Patient Active Problem List   Diagnosis Date Noted   Pressure injury of skin 08/18/2023   Acute CHF (congestive heart failure) (HCC) 08/16/2023   Acute respiratory failure with hypoxia (HCC) 08/16/2023   CKD (chronic kidney disease) stage 3, GFR 30-59 ml/min (HCC) 08/16/2023   Atrial fibrillation with rapid ventricular response (HCC) 04/01/2023   Elevated LFTs 03/31/2023   Acute on chronic systolic CHF (congestive heart failure) (HCC) 03/29/2023   Hyponatremia 03/11/2023   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   Bilateral shoulder pain 09/10/2022   S/P right hip fracture 08/20/2022    Hypotension 05/25/2022   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   Elevated troponin 01/19/2022   Hypokalemia 01/19/2022   LBBB (left bundle branch block) 01/19/2022   Dehydration 10/10/2019   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 [place] [address] Phone: [phone] Fax: [phone]  [id number] [place], [place] - [address] [facility] [address] [place] [address] Phone: [phone] Fax: [phone]  D[facility] - [facility] [address] [place] [address] Phone: [phone] Fax: [phone]     Social Drivers of Health (SDOH) Social History: SDOH Screenings   Food Insecurity: Patient Declined (08/17/2023)  Housing: Low Risk  (08/17/2023)  Transportation Needs: Patient  Declined (08/17/2023)  Utilities: Not At Risk (08/17/2023)  Alcohol Screen: Low Risk  (08/18/2023)  Financial Resource Strain: Low Risk  (08/18/2023)  Social Connections: Moderately Integrated (08/17/2023)  Tobacco Use: Medium Risk (08/16/2023)   SDOH Interventions: Food Insecurity Interventions: Intervention Not Indicated Housing Interventions: Intervention Not Indicated Transportation Interventions: Intervention Not Indicated Utilities Interventions: Intervention Not Indicated Alcohol Usage Interventions: Intervention Not Indicated (Score <7) Financial Strain Interventions: Intervention Not Indicated Social Connections Interventions:  Intervention Not Indicated   Readmission Risk Interventions    04/02/2023    3:52 PM 03/30/2023    1:51 PM  Readmission Risk Prevention Plan  Transportation Screening Complete Complete  PCP or Specialist Appt within 5-7 Days Complete Complete  Home Care Screening Complete Complete  Medication Review (RN CM) Complete Complete

## 2023-08-19 NOTE — Progress Notes (Signed)
 Report called. Pt notified of transfer. Belongings collected including shirt, hairbrush, Consulting civil engineer, phone, dentures, hairspray, dry shampoo, lotion, & chapstick. Pt stable at time of transfer

## 2023-08-19 NOTE — Progress Notes (Addendum)
 Rounding Note    Patient Name: Rhonda Marshall Date of Encounter: 08/19/2023  [facility] Cardiologist: Lynwood Schilling, MD   Subjective   Patient reports feeling better this am.  She denies any chest pain, shortness of breath, palpitations.  She reports improvement in edema in her legs since starting Lasix .   Inpatient Medications    Scheduled Meds:  amiodarone   200 mg Oral Daily   apixaban   5 mg Oral BID   Chlorhexidine  Gluconate Cloth  6 each Topical Daily   digoxin   0.125 mg Oral Daily   furosemide   40 mg Oral Daily   methimazole   5 mg Oral Daily   metoprolol  succinate  50 mg Oral BID   Continuous Infusions:  PRN Meds: ALPRAZolam , docusate sodium , mouth rinse, [DISCONTINUED] oxyCODONE -acetaminophen  **AND** oxyCODONE , oxyCODONE -acetaminophen  **AND** oxyCODONE    Vital Signs    Vitals:   08/19/23 0800 08/19/23 0812 08/19/23 0900 08/19/23 1014  BP: (!) 134/92  (!) 128/90 116/73  Pulse: 83 83 80 94  Resp: 17 (!) 23 16   Temp:  98 F (36.7 C)    TempSrc:  Oral    SpO2: (!) 89% 96% 94%   Weight:      Height:        Intake/Output Summary (Last 24 hours) at 08/19/2023 1121 Last data filed at 08/19/2023 0900 Gross per 24 hour  Intake 780 ml  Output 1650 ml  Net -870 ml      08/17/2023    7:08 AM 08/16/2023    8:37 AM 08/05/2023    3:47 PM  Last 3 Weights  Weight (lbs) 98 lb 1.7 oz 100 lb 5 oz 115 lb 3.2 oz  Weight (kg) 44.5 kg 45.5 kg 52.254 kg      Telemetry    Atrial Fibrillation controlled HR 80-100 - Personally Reviewed  ECG    NA - Personally Reviewed  Physical Exam   GEN: Sitting upright in chair with no acute distress.   Neck: No JVD Cardiac: Irregular RR, radial pulses 2+ Respiratory: Clear to auscultation bilaterally. MS: No edema; No deformity. Neuro:  Nonfocal  Psych: Normal affect   Labs    High Sensitivity Troponin:   Recent Labs  Lab 08/16/23 0032 08/16/23 0213 08/16/23 0649 08/16/23 0915  TROPONINIHS 94* 87* 121* 121*      Chemistry Recent Labs  Lab 08/16/23 0032 08/16/23 0553 08/17/23 0235 08/18/23 0321 08/19/23 0316  NA 139 139 136 132* 129*  K 3.6 2.9* 3.8 3.1* 4.0  CL 105 99 97* 88* 89*  CO2 20* 29 29 31 30   GLUCOSE 204* 111* 113* 115* 107*  BUN 28* 27* 29* 30* 30*  CREATININE 1.18* 1.14* 0.99 1.09* 0.83  CALCIUM 9.3 9.0 9.4 8.9 9.2  MG  --  1.8 2.2  --   --   PROT 7.3 6.6  --   --   --   ALBUMIN  3.2* 2.9*  --   --   --   AST 48* 43*  --   --   --   ALT 28 26  --   --   --   ALKPHOS 157* 135*  --   --   --   BILITOT 1.1 1.5*  --   --   --   GFRNONAA 49* 51* >60 54* >60  ANIONGAP 14 11 10 13 10     Lipids No results for input(s): CHOL, TRIG, HDL, LABVLDL, LDLCALC, CHOLHDL in the last 168 hours.  Hematology Recent Labs  Lab 08/16/23 0032 08/16/23 0553  WBC 12.2* 8.1  RBC 5.06 4.74  HGB 15.0 13.6  HCT 47.2* 43.0  MCV 93.3 90.7  MCH 29.6 28.7  MCHC 31.8 31.6  RDW 19.8* 18.7*  PLT 212 189   Thyroid   Recent Labs  Lab 08/16/23 0553  TSH 2.240    BNP Recent Labs  Lab 08/16/23 0032  BNP >4,500.0*    DDimer No results for input(s): DDIMER in the last 168 hours.   Radiology    No results found.  Cardiac Studies   Cardiac Studies & Procedures     STRESS TESTS  MYOCARDIAL PERFUSION IMAGING 04/29/2017  Narrative  This is a low risk study.  Low risk stress nuclear study with no ischemia or infarction; study not gated due to atrial fibrillation.  ECHOCARDIOGRAM  ECHOCARDIOGRAM COMPLETE 03/30/2023  Narrative ECHOCARDIOGRAM REPORT    Patient Name:   Rhonda Marshall Date of Exam: 03/30/2023 Medical Rec #:  [id number]        Height:       61.0 in Accession #:    [id number]       Weight:       107.4 lb Date of Birth:  Jun 03, 1951        BSA:          1.450 m Patient Age:    72 years         BP:           128/100 mmHg Patient Gender: F                HR:           67 bpm. Exam Location:  Inpatient  Procedure: 2D Echo, 3D Echo, Cardiac Doppler and  Color Doppler  Indications:    I50.40* Unspecified combined systolic (congestive) and diastolic (congestive) heart failure  History:        Patient has prior history of Echocardiogram examinations, most recent 02/04/2023. CHF, Abnormal ECG, Arrythmias:Atrial Fibrillation and LBBB, Signs/Symptoms:Chest Pain, Dyspnea and Shortness of Breath; Risk Factors:Current Smoker. Moderate mitral regurgitation.  Sonographer:    Rhonda Marshall RDCS Referring Phys: [phone] Rhonda Marshall  IMPRESSIONS   1. Left ventricular ejection fraction, by estimation, is 30 to 35%. The left ventricle has moderately decreased function. The left ventricle demonstrates global hypokinesis. Left ventricular diastolic parameters are indeterminate. 2. Right ventricular systolic function is normal. The right ventricular size is normal. 3. Left atrial size was severely dilated. 4. Right atrial size was moderately dilated. 5. Moderate pleural effusion in the left lateral region. 6. The mitral valve is degenerative. Mild mitral valve regurgitation. No evidence of mitral stenosis. 7. The aortic valve is tricuspid. Aortic valve regurgitation is not visualized. Aortic valve sclerosis is present, with no evidence of aortic valve stenosis.  FINDINGS Left Ventricle: Left ventricular ejection fraction, by estimation, is 30 to 35%. The left ventricle has moderately decreased function. The left ventricle demonstrates global hypokinesis. The left ventricular internal cavity size was normal in size. There is no left ventricular hypertrophy. Abnormal (paradoxical) septal motion, consistent with left bundle branch block. Left ventricular diastolic parameters are indeterminate.  Right Ventricle: The right ventricular size is normal. Right ventricular systolic function is normal.  Left Atrium: Left atrial size was severely dilated.  Right Atrium: Right atrial size was moderately dilated.  Pericardium: There is no evidence of pericardial  effusion.  Mitral Valve: The mitral valve is degenerative in appearance. Mild mitral valve regurgitation. No evidence of mitral valve stenosis.  Tricuspid Valve: The tricuspid valve is normal in structure. Tricuspid valve regurgitation is mild . No evidence of tricuspid stenosis.  Aortic Valve: The aortic valve is tricuspid. Aortic valve regurgitation is not visualized. Aortic valve sclerosis is present, with no evidence of aortic valve stenosis.  Pulmonic Valve: The pulmonic valve was grossly normal. Pulmonic valve regurgitation is mild. No evidence of pulmonic stenosis.  Aorta: The aortic root and ascending aorta are structurally normal, with no evidence of dilitation.  IAS/Shunts: No atrial level shunt detected by color flow Doppler.  Additional Comments: There is a moderate pleural effusion in the left lateral region.   LEFT VENTRICLE PLAX 2D LVIDd:         4.05 cm LVIDs:         3.60 cm LV PW:         1.25 cm LV IVS:        1.00 cm LVOT diam:     2.20 cm LV SV:         53 LV SV Index:   36 LVOT Area:     3.80 cm  LV Volumes (MOD) LV vol d, MOD A2C: 53.2 ml LV vol d, MOD A4C: 57.9 ml LV vol s, MOD A2C: 35.1 ml LV vol s, MOD A4C: 36.5 ml LV SV MOD A2C:     18.1 ml LV SV MOD A4C:     57.9 ml LV SV MOD BP:      19.9 ml  RIGHT VENTRICLE            IVC RV S prime:     7.94 cm/s  IVC diam: 1.80 cm TAPSE (M-mode): 0.9 cm  LEFT ATRIUM             Index        RIGHT ATRIUM           Index LA diam:        4.90 cm 3.38 cm/m   RA Area:     17.90 cm LA Vol (A2C):   61.3 ml 42.28 ml/m  RA Volume:   46.10 ml  31.79 ml/m LA Vol (A4C):   71.7 ml 49.45 ml/m LA Biplane Vol: 65.8 ml 45.38 ml/m AORTIC VALVE             PULMONIC VALVE LVOT Vmax:   94.00 cm/s  PR End Diast Vel: 1.83 msec LVOT Vmean:  64.950 cm/s LVOT VTI:    0.138 m  AORTA Ao Root diam: 3.10 cm Ao Asc diam:  3.60 cm  MITRAL VALVE               TRICUSPID VALVE MV Area (PHT): 5.54 cm    TR Peak grad:   22.1  mmHg MV Decel Time: 137 msec    TR Vmax:        235.00 cm/s MV E velocity: 98.10 cm/s SHUNTS Systemic VTI:  0.14 m Systemic Diam: 2.20 cm  Redell Shallow MD Electronically signed by Redell Shallow MD Signature Date/Time: 03/30/2023/12:01:03 PM    Final  TEE  ECHO TEE 01/22/2022  Narrative TRANSESOPHOGEAL ECHO REPORT    Patient Name:   Rhonda Marshall Date of Exam: 01/22/2022 Medical Rec #:  [id number]        Height:       61.0 in Accession #:    [id number]       Weight:       110.9 lb Date of Birth:  Apr 17, 1951        BSA:  1.470 m Patient Age:    71 years         BP:           94/83 mmHg Patient Gender: F                HR:           108 bpm. Exam Location:  Inpatient  Procedure: 3D Echo, Transesophageal Echo, Cardiac Doppler and Color Doppler  Indications:     R94.31 Abnormal EKG; I48.91* Unspeicified atrial fibrillation  History:         Patient has prior history of Echocardiogram examinations, most recent 01/19/2022. Abnormal ECG, Arrythmias:Atrial Fibrillation and LBBB, Signs/Symptoms:Altered Mental Status, Chest Pain and Dyspnea; Risk Factors:Current Smoker.  Sonographer:     Rhonda Marshall RDCS Referring Phys:  [id number] POWELL BRAVO [place] Diagnosing Phys: Powell Sorrow MD  PROCEDURE: After discussion of the risks and benefits of a TEE, an informed consent was obtained from the patient. The transesophogeal probe was passed without difficulty through the esophogus of the patient. Imaged were obtained with the patient in a left lateral decubitus position. Sedation performed by different physician. The patient was monitored while under deep sedation. Anesthestetic sedation was provided intravenously by Anesthesiology: 153mg  of Propofol . The patient's vital signs; including heart rate, blood pressure, and oxygen  saturation; remained stable throughout the procedure. The patient developed no complications during the procedure. A successful direct current cardioversion  was performed at 200 joules with 2 attempts.  IMPRESSIONS   1. Left ventricular ejection fraction, by estimation, is 30 to 35%. The left ventricle has moderately decreased function. 2. Right ventricular systolic function is mildly reduced. The right ventricular size is normal. 3. Left atrial size was moderately dilated. There was smoke in the left atrial appendage without evidence of thrombus. 4. Right atrial size was moderately dilated. 5. The mitral valve is abnormal. There is mild thickening and calcification of the mitral valve leaflets. There is mild prolapse of the A2 and P2 scallops of the mitral valve with resultant mild mitral regurgitation. 6. The aortic valve is tricuspid. There is mild calcification of the aortic valve. There is mild thickening of the aortic valve. Aortic valve regurgitation is not visualized. 7. There is grade IV plaque at the level of the aortic arch. Aortic dilatation noted. There is borderline dilatation of the ascending aorta, measuring 36 mm.  FINDINGS Left Ventricle: Left ventricular ejection fraction, by estimation, is 30 to 35%. The left ventricle has moderately decreased function. The left ventricular internal cavity size was normal in size.  Right Ventricle: The right ventricular size is normal. No increase in right ventricular wall thickness. Right ventricular systolic function is mildly reduced.  Left Atrium: Left atrial size was moderately dilated. No left atrial/left atrial appendage thrombus was detected.  Right Atrium: Right atrial size was moderately dilated.  Pericardium: There is no evidence of pericardial effusion.  Mitral Valve: The mitral valve is abnormal. There is mild prolapse of A2 and P2 scallops of the mitral valve. There is mild thickening of the mitral valve leaflet(s). There is mild calcification of the mitral valve leaflet(s). Mild mitral valve regurgitation.  Tricuspid Valve: The tricuspid valve is normal in structure.  Tricuspid valve regurgitation is trivial.  Aortic Valve: The aortic valve is tricuspid. There is mild calcification of the aortic valve. There is mild thickening of the aortic valve. Aortic valve regurgitation is not visualized.  Pulmonic Valve: The pulmonic valve was normal in structure. Pulmonic valve regurgitation is mild to  moderate.  Aorta: There is grade IV plaque at the level of the aortic arch. Aortic dilatation noted. There is borderline dilatation of the ascending aorta, measuring 36 mm.  IAS/Shunts: The atrial septum is grossly normal.    AORTA Ao Asc diam: 3.60 cm  MR Peak grad:    86.1 mmHg MR Mean grad:    45.0 mmHg MR Vmax:         464.00 cm/s MR Vmean:        308.0 cm/s MR PISA:         1.57 cm MR PISA Eff ROA: 13 mm MR PISA Radius:  0.50 cm  Powell Sorrow MD Electronically signed by Powell Sorrow MD Signature Date/Time: 01/22/2022/4:11:39 PM    Final             Patient Profile     73 y.o. female admitted with acute on chronic HFrEF and atrial fibrillation with RVR with past medical history of HFrEF, persistent atrial fibrillation, CKD 3A and hyperthyroidism.  Assessment & Plan    Persistent atrial fibrillation with RVR -Currently on PO metoprolol  succinate 50 mg BID, Amiodarone  200 mg daily, PO digoxin  0.125 mg daily  - Per telemetry, patient remains in atrial fibrillation with HR in the 80s-100s. At time of my evaluation, patient sitting in chair and HR in the 90s  -Patient denies any palpitations, shortness of breath, chest pain or weakness - Continue combination of metoprolol , amiodarone ,and digoxin  for now. Her BP is tolerating reduced dose of metoprolol  well  - Plan to check dig levels in a few days - Continue eliquis  5 mg BID   Acute on chronic systolic and diastolic heart failure -Echocardiogram 03/2023 showed EF 30 to 35%, global hypokinesis, normal RV function, severe LA dilation, moderate RA dilation and mild MR. Presented with  shortness of breath, BNP elevated to >4500. CXR showed mild pulmonary edema and small left pleural effusion.  - Patient was on IV lasix , transitioned to PO lasix  yesterday. Creatinine 1.09 > 0.83 today  - Overall euvolemic on exam. Continue Po lasix  40 mg daily  -Given her UTI history, no plans for SGLT2 inhibitors. Unable to further titrate GDMT at this time with low BP  - Continue metoprolol  succinate 50 mg BID   Hypokalemia - K 3.1 yesterday. Improved to 4.0 after supplementation    For questions or updates, please contact [facility] Please consult [email or web address] for contact info under        Signed, Rollo FABIENE Louder, PA-C  08/19/2023, 11:21 AM   Cardiologist Addendum:  History and all data above reviewed.  Patient examined.  I agree with the findings as above.  All available labs, radiology testing, previous records reviewed. Agree with documented assessment and plan.  Ms. Duck is a 53F with HFrEF, persistent atrial fibrillation, CKD 3a and hyperthyroidism admittd with acute on chronic HFrEF and atrial fibrillation with RVR>    # Persistent atrial fibrillation with RVR:  Ms. Ledlow remains in atrial fibrillation.  Heart rate was much better controlled after adding digoxin .  Metoprolol  was reduced due to hypotension.  Continue metoprolol , amiodarone  and digoxin . Once loaded with digoxin , will stop amiodarone  given that she has persistent atrial fibrillation.  Continue Eliquis .  Check digoxin  level on 2/7.   # Acute on chronic systolic and diastolic heart failure:  Volume status seems to be improving.  Volume status is stable and renal function is stable as well on oral diuretics.  Blood pressure is too low for further titration  of GDMT.  Given her history of UTIs no plans for SGLT2 inhibitor.  Darrelle Wiberg C. Raford, MD, [facility]  08/19/2023 2:11 PM

## 2023-08-19 NOTE — TOC Transition Note (Signed)
 Transition of Care [facility]) - Discharge Note   Patient Details  Name: Rhonda Marshall MRN: [id number] Date of Birth: 1951-01-23  Transition of Care [facility] CM/SW Contact:  Tawni CHRISTELLA Eva, LCSW Phone Number: 08/19/2023, 3:02 PM   Clinical Narrative:    CSW spoke with pt about consult for [facility] services. Pt has declined stating she has her sisters to help if needed. Pt denies transportation needs, stated he son will pick her up. No further TOC needs , TOC sign off.    Final next level of care: Home/Self Care Barriers to Discharge: Continued Medical Work up   Patient Goals and CMS Choice            Discharge Placement                       Discharge Plan and Services Additional resources added to the After Visit Summary for                                       Social Drivers of Health (SDOH) Interventions SDOH Screenings   Food Insecurity: Patient Declined (08/17/2023)  Housing: Low Risk  (08/17/2023)  Transportation Needs: Patient Declined (08/17/2023)  Utilities: Not At Risk (08/17/2023)  Alcohol Screen: Low Risk  (08/18/2023)  Financial Resource Strain: Low Risk  (08/18/2023)  Social Connections: Moderately Integrated (08/17/2023)  Tobacco Use: Medium Risk (08/16/2023)     Readmission Risk Interventions    04/02/2023    3:52 PM 03/30/2023    1:51 PM  Readmission Risk Prevention Plan  Transportation Screening Complete Complete  PCP or Specialist Appt within 5-7 Days Complete Complete  Home Care Screening Complete Complete  Medication Review (RN CM) Complete Complete

## 2023-08-19 NOTE — Progress Notes (Signed)
 Discharge instructions reviewed with patient and son. All questions answered. All belongings accounted for. Patient to follow up with MD in  1 weeks. Patient medications hand delivered from outpatient pharmacy. PIV removed. Assisted via WC to private vehicle.

## 2023-08-19 NOTE — Progress Notes (Signed)
 Pt arrived from ICU in w/c. Transferred to floor bed w/ CGA. A&Ox4. Oriented to callbell and environment. POC discussed. Offers no c/o at present. VSS as charted.

## 2023-08-19 NOTE — Discharge Summary (Signed)
 Physician Discharge Summary  Rhonda Marshall FMW:[id number] DOB: [birth year] DOA: 08/15/2023  PCP: Joshua Francisco, MD  Admit date: 08/15/2023 Discharge date: 08/19/2023 Recommendations for Outpatient Follow-up:  Follow up with PCP in 1 weeks-call for appointment Please obtain BMP/CBC in one week  Discharge Dispo: Home Discharge Condition: Stable Code Status:   Code Status: Full Code Diet recommendation:  Diet Order             Diet - low sodium heart healthy           Diet Heart Room service appropriate? Yes; Fluid consistency: Thin; Fluid restriction: 1200 mL Fluid  Diet effective now                    Brief/Interim Summary: 72 yYOF W/ chronic HFrEF likely tachyarrhythmia/A-fib related, CKD IIIa b/l creat ! 1.1,hyperthyroidism to the ER with complaints of shortness of breath x [redacted] wk along with weakness, lost about 7 pounds, orthopnea and leg itching. She recently followed up with her cardiologist Dr. Lavona on 08/05/2023 when patient was advised to take Lasix  20 milligrams daily for 3 days and her Toprol  dose was increased. In ZI:ybenkpr and dyspneic and was placed on BiPAP. CXR>pulmonary edema with pleural effusion. BNP>4500 troponins were 94 and 87.  WBC count 12.2 blood gas shows pH of 7.35 pCO2 of 44 Patient was admitted for further management, cardiology was consulted. She was diuresed with IV Lasix  and clinically improving. Volume status improved Lasix  transitioned to oral, BP at times soft difficult to titrate GDMT, heart rate better controlled after adding digoxin , Metropol dose decreased due to soft BP.Overall clinically improved and will be discharged once okay with cardiology.  Patient has been ambulating in the hallway no need for oxygen  at this time and vitals stable    Discharge Diagnoses:  Principal Problem:   Acute respiratory failure with hypoxia (HCC) Active Problems:   Acute on chronic systolic CHF (congestive heart failure) (HCC)   Longstanding persistent  atrial fibrillation (HCC)   Rheumatoid arthritis (HCC)   Hyperthyroidism   Elevated LFTs   Atrial fibrillation with rapid ventricular response (HCC)   CKD (chronic kidney disease) stage 3, GFR 30-59 ml/min (HCC)   Pressure injury of skin   Acute hypoxic respiratory failure Acute on chronic HFrEF Acute pulmonary edema/pleural effusion: Patient with hypoxic respiratory failure secondary to CHF exacerbation w/ elevated BNP, cxr w/ PE/ ple effusiin with hx of LVEF on 9/24 in 30 to 35%.  Initially needing BiPAP, weaned of to [address] and volume status improved, now transitioned to room air. Lasix  transitioned to oral, BP at times soft difficult to titrate GDMT, heart rate better controlled after adding digoxin , Metropol dose decreased due to soft BP. Overall clinically improved and will be discharged once okay with cardiology.  Cardiology has arranged outpatient follow-up   Hypokalemia: Resolved   Permanent A-fib: Rate better controlled-difficult with rate controlling agent due to soft BP continue digoxin , amiodarone  and metoprolol  as per cardiology along with home Eliquis .  Patient will have a digoxin  level checked outpatient on 2/7   Elevated troponin IS demand ischemia due to chf. NO chest pain. No plan for ischemic eval.   Chronic kidney disease stage IIIa Hyponatremia in the setting of CHF: creatinine stable. Monitor while on Lasix .  Follow-up BMP as outpatient   Prediabetes: A1c 6.1. Blood sugar controlled  Elevated LFTs follow LFTs.? Congestive hepatopathy.   Hyperthyroidism: TSH normal.  Continue home methimazole    Chronic pain syndrome Anxiety:  Mood stable continue  her  percocet prn xanax    Pressure injury stage II left to POA, and also on left anterior unstageable ulcer on TOE- see below Pressure Ulcer: Pressure Injury 08/16/23 Toe (Comment  which one) Left;Anterior Stage 2 -  Partial thickness loss of dermis presenting as a shallow open injury with a red, pink wound bed  without slough. (Active)  08/16/23 9085  Location: Toe (Comment  which one)  Location Orientation: Left;Anterior (Fourth toe)  Staging: Stage 2 -  Partial thickness loss of dermis presenting as a shallow open injury with a red, pink wound bed without slough.  Wound Description (Comments):   Present on Admission: Yes (Patient states those are from my shoes I think)  Dressing Type Gauze (Comment) 08/19/23 1246     Pressure Injury 08/16/23 Toe (Comment  which one) Anterior;Left Unstageable - Full thickness tissue loss in which the base of the injury is covered by slough (yellow, tan, gray, green or brown) and/or eschar (tan, brown or black) in the wound bed. (Active)  08/16/23 9141  Location: Toe (Comment  which one)  Location Orientation: Anterior;Left (Second)  Staging: Unstageable - Full thickness tissue loss in which the base of the injury is covered by slough (yellow, tan, gray, green or brown) and/or eschar (tan, brown or black) in the wound bed.  Wound Description (Comments):   Present on Admission: Yes (Patient states those are from my shoes I think)  Dressing Type Gauze (Comment) 08/19/23 1246    Consults: Cardiology Subjective: On arrival oriented doing well on room air, sodium improved,  Discharge Exam: Vitals:   08/19/23 1200 08/19/23 1246  BP: 128/78 (!) 126/96  Pulse: 91 90  Resp: 16 20  Temp:  99 F (37.2 C)  SpO2: 93% 96%   General: Pt is alert, awake, not in acute distress Cardiovascular: RRR, S1/S2 +, no rubs, no gallops Respiratory: CTA bilaterally, no wheezing, no rhonchi Abdominal: Soft, NT, ND, bowel sounds + Extremities: no edema, no cyanosis  Discharge Instructions  Discharge Instructions     (HEART FAILURE PATIENTS) Call MD:  Anytime you have any of the following symptoms: 1) 3 pound weight gain in 24 hours or 5 pounds in 1 week 2) shortness of breath, with or without a dry hacking cough 3) swelling in the hands, feet or stomach 4) if you have to  sleep on extra pillows at night in order to breathe.   Complete by: As directed    Diet - low sodium heart healthy   Complete by: As directed    Discharge instructions   Complete by: As directed    Please call call MD or return to ER for similar or worsening recurring problem that brought you to hospital or if any fever,nausea/vomiting,abdominal pain, uncontrolled pain, chest pain,  shortness of breath or any other alarming symptoms.  Please follow-up your doctor as instructed in a week time and call the office for appointment.  Please avoid alcohol, smoking, or any other illicit substance and maintain healthy habits including taking your regular medications as prescribed.  You were cared for by a hospitalist during your hospital stay. If you have any questions about your discharge medications or the care you received while you were in the hospital after you are discharged, you can call the unit and ask to speak with the hospitalist on call if the hospitalist that took care of you is not available.  Once you are discharged, your primary care physician will handle any further medical issues. Please note  that NO REFILLS for any discharge medications will be authorized once you are discharged, as it is imperative that you return to your primary care physician (or establish a relationship with a primary care physician if you do not have one) for your aftercare needs so that they can reassess your need for medications and monitor your lab values   Discharge wound care:   Complete by: As directed    Cleanse toe wounds with saline, cover with single layer of xeroform gauze, top with foam or gauze. 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   ALPRAZolam  1 MG tablet Commonly known as: XANAX  Take 1 mg by mouth 4 (four) times daily as needed for anxiety.   amiodarone  200 MG tablet Commonly known as: PACERONE  Take 1 tablet (200 mg total) by mouth daily.   diclofenac  Sodium 1 % Gel Commonly known as: VOLTAREN  Apply 2 g topically 4 (four) times daily as needed.   digoxin  0.125 MG tablet Commonly known as: LANOXIN  Take 1 tablet (0.125 mg total) by mouth daily. Start taking on: August 20, 2023   docusate sodium  100 MG capsule Commonly known as: COLACE Take 1 capsule (100 mg total) by mouth 2 (two) times daily as needed for mild constipation.   Eliquis  5 MG Tabs tablet Generic drug: apixaban  Take 1 tablet (5 mg total) by mouth 2 (two) times daily.   furosemide  40 MG tablet Commonly known as: LASIX  Take 1 tablet (40 mg total) by mouth daily. Start taking on: August 20, 2023 What changed:  medication strength how much to take additional instructions   loperamide  2 MG tablet Commonly known as: IMODIUM  A-D Take 2 mg by mouth 4 (four) times daily as needed for diarrhea or loose stools.   methimazole  5 MG tablet Commonly known as: TAPAZOLE  Take 1 tablet (5 mg total) by mouth daily.   metoprolol  succinate 50 MG 24 hr tablet Commonly known as: TOPROL -XL Take 1 tablet (50 mg total) by mouth 2 (two) times daily. Take with or immediately following a meal. What changed:  medication strength how much to take when to take this additional instructions   multivitamin with minerals Tabs tablet Take 1 tablet by mouth daily.   oxyCODONE -acetaminophen  10-325 MG tablet Commonly known as: PERCOCET Take 1 tablet by mouth every 4 (four) hours as needed for pain.   Vitamin D  (Ergocalciferol ) 1.25 MG (50000 UNIT) Caps capsule Commonly known as: DRISDOL Take 50,000 Units by mouth once a week.                Discharge Care Instructions  (From admission, onward)           Start     Ordered   08/19/23 0000  Discharge wound care:       Comments: Cleanse toe wounds with saline, cover with single layer of xeroform gauze, top with foam or gauze. Change every other da   08/19/23 1440  Follow-up Information     [facility]. Go in 10 day(s).   Specialty: Cardiology Why: Hospital follow-up 08/28/2023 @ 12 noon PLease bring a current medication list to appointment FREE valet parking, Entrance C, off [employer] information: [address] [place]  [address] [phone]        Joshua Francisco, MD Follow up in 1 week(s).   Specialty: Family Medicine Contact information: [address] [place] [address] [phone]         [facility] Follow up on 08/21/2023.   Specialty: Cardiology Why: Please come to the [facility] Cardiology office on 08/21/23 to have labs collected. Come in the morning between 8-11 AM. Do not take your digoxin  until after you have had your labs drawn. No need to fast prior to labs. 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    Microbiology: Recent Results (from the past 240 hours)  Resp panel by RT-PCR (RSV, Flu A&B, Covid) Anterior Nasal Swab     Status: None   Collection Time: 08/16/23  2:14 AM   Specimen: Anterior Nasal Swab  Result Value Ref Range Status   SARS Coronavirus 2 by RT PCR NEGATIVE NEGATIVE Final     Comment: (NOTE) SARS-CoV-2 target nucleic acids are NOT DETECTED.  The SARS-CoV-2 RNA is generally detectable in upper respiratory specimens during the acute phase of infection. The lowest concentration of SARS-CoV-2 viral copies this assay can detect is 138 copies/mL. A negative result does not preclude SARS-Cov-2 infection and should not be used as the sole basis for treatment or other patient management decisions. A negative result may occur with  improper specimen collection/handling, submission of specimen other than nasopharyngeal swab, presence of viral mutation(s) within the areas targeted by this assay, and inadequate number of viral copies(<138 copies/mL). A negative result must be combined with clinical observations, patient history, and epidemiological information. The expected result is Negative.  Fact Sheet for Patients:  bloggercourse.com  Fact Sheet for Healthcare Providers:  seriousbroker.it  This test is no t yet approved or cleared by the [place]  FDA and  has been authorized for detection and/or diagnosis of SARS-CoV-2 by FDA under an Emergency Use Authorization (EUA). This EUA will remain  in effect (meaning this test can be used) for the duration of the COVID-19 declaration under Section 564(b)(1) of the Act, 21 U.S.C.section 360bbb-3(b)(1), unless the authorization is terminated  or revoked sooner.       Influenza A by PCR NEGATIVE NEGATIVE Final   Influenza B by PCR NEGATIVE NEGATIVE Final    Comment: (NOTE) The Xpert Xpress SARS-CoV-2/FLU/RSV plus assay is intended as an aid in the diagnosis of influenza from Nasopharyngeal swab specimens and should not be used as a sole basis for treatment. Nasal washings and aspirates are unacceptable for Xpert Xpress SARS-CoV-2/FLU/RSV testing.  Fact Sheet for Patients: bloggercourse.com  Fact Sheet for Healthcare  Providers: seriousbroker.it  This test is not yet approved or cleared by the [place]  FDA and has been authorized for detection  and/or diagnosis of SARS-CoV-2 by FDA under an Emergency Use Authorization (EUA). This EUA will remain in effect (meaning this test can be used) for the duration of the COVID-19 declaration under Section 564(b)(1) of the Act, 21 U.S.C. section 360bbb-3(b)(1), unless the authorization is terminated or revoked.     Resp Syncytial Virus by PCR NEGATIVE NEGATIVE Final    Comment: (NOTE) Fact Sheet for Patients: bloggercourse.com  Fact Sheet for Healthcare Providers: seriousbroker.it  This test is not yet approved or cleared by the [place]  FDA and has been authorized for detection and/or diagnosis of SARS-CoV-2 by FDA under an Emergency Use Authorization (EUA). This EUA will remain in effect (meaning this test can be used) for the duration of the COVID-19 declaration under Section 564(b)(1) of the Act, 21 U.S.C. section 360bbb-3(b)(1), unless the authorization is terminated or revoked.  Performed at [facility], [address] [place], [place] [address]   MRSA Next Gen by PCR, Nasal     Status: None   Collection Time: 08/16/23  8:51 AM   Specimen: Nasal Mucosa; Nasal Swab  Result Value Ref Range Status   MRSA by PCR Next Gen NOT DETECTED NOT DETECTED Final    Comment: (NOTE) The GeneXpert MRSA Assay (FDA approved for NASAL specimens only), is one component of a comprehensive MRSA colonization surveillance program. It is not intended to diagnose MRSA infection nor to guide or monitor treatment for MRSA infections. Test performance is not FDA approved in patients less than 57 years old. Performed at [facility], [address] [place], [place] [address]     Procedures/Studies: DG Chest Port 1 View Result Date:  08/15/2023 CLINICAL DATA:  Shortness of breath EXAM: PORTABLE CHEST 1 VIEW COMPARISON:  04/02/2023 FINDINGS: Moderate cardiomegaly with bilateral interstitial opacities likely mild pulmonary edema. Small left pleural effusion. IMPRESSION: Moderate cardiomegaly with mild pulmonary edema and small left pleural effusion. Electronically Signed   By: Franky Stanford M.D.   On: 08/15/2023 23:57    Labs: BNP (last 3 results) Recent Labs    03/14/23 0324 03/29/23 1042 08/16/23 0032  BNP >4,500.0* >4,500.0* >4,500.0*   Basic Metabolic Panel: Recent Labs  Lab 08/16/23 0032 08/16/23 0553 08/17/23 0235 08/18/23 0321 08/19/23 0316  NA 139 139 136 132* 129*  K 3.6 2.9* 3.8 3.1* 4.0  CL 105 99 97* 88* 89*  CO2 20* 29 29 31 30   GLUCOSE 204* 111* 113* 115* 107*  BUN 28* 27* 29* 30* 30*  CREATININE 1.18* 1.14* 0.99 1.09* 0.83  CALCIUM 9.3 9.0 9.4 8.9 9.2  MG  --  1.8 2.2  --   --    Liver Function Tests: Recent Labs  Lab 08/16/23 0032 08/16/23 0553  AST 48* 43*  ALT 28 26  ALKPHOS 157* 135*  BILITOT 1.1 1.5*  PROT 7.3 6.6  ALBUMIN  3.2* 2.9*  CBC: Recent Labs  Lab 08/16/23 0032 08/16/23 0553  WBC 12.2* 8.1  NEUTROABS 10.7* 7.1  HGB 15.0 13.6  HCT 47.2* 43.0  MCV 93.3 90.7  PLT 212 189  Anemia work up No results for input(s): VITAMINB12, FOLATE, FERRITIN, TIBC, IRON, RETICCTPCT in the last 72 hours. Urinalysis    Component Value Date/Time   COLORURINE STRAW (A) 03/29/2023 1216   APPEARANCEUR CLEAR 03/29/2023 1216   LABSPEC 1.004 (L) 03/29/2023 1216   PHURINE 6.0 03/29/2023 1216   GLUCOSEU NEGATIVE 03/29/2023 1216   HGBUR MODERATE (A) 03/29/2023 1216   BILIRUBINUR NEGATIVE 03/29/2023 1216   KETONESUR  NEGATIVE 03/29/2023 1216   PROTEINUR NEGATIVE 03/29/2023 1216   UROBILINOGEN 0.2 11/05/2010 1220   NITRITE NEGATIVE 03/29/2023 1216   LEUKOCYTESUR NEGATIVE 03/29/2023 1216   Sepsis Labs Recent Labs  Lab 08/16/23 0032 08/16/23 0553  WBC 12.2* 8.1    Microbiology Recent Results (from the past 240 hours)  Resp panel by RT-PCR (RSV, Flu A&B, Covid) Anterior Nasal Swab     Status: None   Collection Time: 08/16/23  2:14 AM   Specimen: Anterior Nasal Swab  Result Value Ref Range Status   SARS Coronavirus 2 by RT PCR NEGATIVE NEGATIVE Final    Comment: (NOTE) SARS-CoV-2 target nucleic acids are NOT DETECTED.  The SARS-CoV-2 RNA is generally detectable in upper respiratory specimens during the acute phase of infection. The lowest concentration of SARS-CoV-2 viral copies this assay can detect is 138 copies/mL. A negative result does not preclude SARS-Cov-2 infection and should not be used as the sole basis for treatment or other patient management decisions. A negative result may occur with  improper specimen collection/handling, submission of specimen other than nasopharyngeal swab, presence of viral mutation(s) within the areas targeted by this assay, and inadequate number of viral copies(<138 copies/mL). A negative result must be combined with clinical observations, patient history, and epidemiological information. The expected result is Negative.  Fact Sheet for Patients:  bloggercourse.com  Fact Sheet for Healthcare Providers:  seriousbroker.it  This test is no t yet approved or cleared by the [place]  FDA and  has been authorized for detection and/or diagnosis of SARS-CoV-2 by FDA under an Emergency Use Authorization (EUA). This EUA will remain  in effect (meaning this test can be used) for the duration of the COVID-19 declaration under Section 564(b)(1) of the Act, 21 U.S.C.section 360bbb-3(b)(1), unless the authorization is terminated  or revoked sooner.       Influenza A by PCR NEGATIVE NEGATIVE Final   Influenza B by PCR NEGATIVE NEGATIVE Final    Comment: (NOTE) The Xpert Xpress SARS-CoV-2/FLU/RSV plus assay is intended as an aid in the diagnosis of influenza  from Nasopharyngeal swab specimens and should not be used as a sole basis for treatment. Nasal washings and aspirates are unacceptable for Xpert Xpress SARS-CoV-2/FLU/RSV testing.  Fact Sheet for Patients: bloggercourse.com  Fact Sheet for Healthcare Providers: seriousbroker.it  This test is not yet approved or cleared by the [place]  FDA and has been authorized for detection and/or diagnosis of SARS-CoV-2 by FDA under an Emergency Use Authorization (EUA). This EUA will remain in effect (meaning this test can be used) for the duration of the COVID-19 declaration under Section 564(b)(1) of the Act, 21 U.S.C. section 360bbb-3(b)(1), unless the authorization is terminated or revoked.     Resp Syncytial Virus by PCR NEGATIVE NEGATIVE Final    Comment: (NOTE) Fact Sheet for Patients: bloggercourse.com  Fact Sheet for Healthcare Providers: seriousbroker.it  This test is not yet approved or cleared by the [place]  FDA and has been authorized for detection and/or diagnosis of SARS-CoV-2 by FDA under an Emergency Use Authorization (EUA). This EUA will remain in effect (meaning this test can be used) for the duration of the COVID-19 declaration under Section 564(b)(1) of the Act, 21 U.S.C. section 360bbb-3(b)(1), unless the authorization is terminated or revoked.  Performed at [facility], [address] [place], [place] [address]   MRSA Next Gen by PCR, Nasal     Status: None   Collection Time: 08/16/23  8:51 AM   Specimen: Nasal Mucosa;  Nasal Swab  Result Value Ref Range Status   MRSA by PCR Next Gen NOT DETECTED NOT DETECTED Final    Comment: (NOTE) The GeneXpert MRSA Assay (FDA approved for NASAL specimens only), is one component of a comprehensive MRSA colonization surveillance program. It is not intended to diagnose MRSA infection nor to guide or  monitor treatment for MRSA infections. Test performance is not FDA approved in patients less than 66 years old. Performed at [facility], [address] [place], [place] [address]      Time coordinating discharge: 35 minutes  SIGNED: Mennie LAMY, MD  [facility] 08/19/2023, 2:41 PM  If 7PM-7AM, please contact night-coverage [email or web address]

## 2023-08-21 ENCOUNTER — Other Ambulatory Visit: Payer: Self-pay

## 2023-08-21 DIAGNOSIS — I4819 Other persistent atrial fibrillation: Secondary | ICD-10-CM

## 2023-08-21 DIAGNOSIS — Z79899 Other long term (current) drug therapy: Secondary | ICD-10-CM

## 2023-08-22 LAB — DIGOXIN LEVEL: Digoxin, Serum: 1.4 ng/mL — ABNORMAL HIGH (ref 0.5–0.9)

## 2023-08-24 ENCOUNTER — Telehealth: Payer: Self-pay

## 2023-08-24 DIAGNOSIS — Z79899 Other long term (current) drug therapy: Secondary | ICD-10-CM

## 2023-08-24 NOTE — Telephone Encounter (Signed)
-----   Message from Debria Fang sent at 08/22/2023  2:17 PM EST ----- Please tell patient that her digoxin  level is a bit elevated. She should decrease her digoxin  dose to 0.0625 mg daily. This is half of a tablet. She has an appointment with the Heart Failure TOC on 2/14. Order a digoxin  level to be collected at that appointment. Instruct her that on 2/14, she should not take her digoxin  until her labs are drawn

## 2023-08-24 NOTE — Telephone Encounter (Signed)
 Called patient advised of below they verbalized understanding. Ordered Dig level

## 2023-08-28 ENCOUNTER — Ambulatory Visit (HOSPITAL_COMMUNITY)
Admit: 2023-08-28 | Discharge: 2023-08-28 | Disposition: A | Discharge status: Home / Self Care | Payer: 59 | Source: Ambulatory Visit | Attending: Cardiology | Admitting: Cardiology

## 2023-08-28 ENCOUNTER — Telehealth (HOSPITAL_COMMUNITY): Payer: Self-pay

## 2023-08-28 ENCOUNTER — Encounter (HOSPITAL_COMMUNITY): Payer: Self-pay

## 2023-08-28 VITALS — BP 128/81 | HR 82 | Wt 92.0 lb

## 2023-08-28 DIAGNOSIS — I4811 Longstanding persistent atrial fibrillation: Secondary | ICD-10-CM

## 2023-08-28 DIAGNOSIS — I5022 Chronic systolic (congestive) heart failure: Secondary | ICD-10-CM | POA: Diagnosis not present

## 2023-08-28 DIAGNOSIS — I4821 Permanent atrial fibrillation: Secondary | ICD-10-CM | POA: Insufficient documentation

## 2023-08-28 DIAGNOSIS — N1831 Chronic kidney disease, stage 3a: Secondary | ICD-10-CM | POA: Insufficient documentation

## 2023-08-28 DIAGNOSIS — I130 Hypertensive heart and chronic kidney disease with heart failure and stage 1 through stage 4 chronic kidney disease, or unspecified chronic kidney disease: Secondary | ICD-10-CM | POA: Insufficient documentation

## 2023-08-28 DIAGNOSIS — E0590 Thyrotoxicosis, unspecified without thyrotoxic crisis or storm: Secondary | ICD-10-CM | POA: Insufficient documentation

## 2023-08-28 DIAGNOSIS — I4819 Other persistent atrial fibrillation: Secondary | ICD-10-CM | POA: Diagnosis present

## 2023-08-28 DIAGNOSIS — Z7901 Long term (current) use of anticoagulants: Secondary | ICD-10-CM | POA: Insufficient documentation

## 2023-08-28 LAB — BASIC METABOLIC PANEL
Anion gap: 13 (ref 5–15)
BUN: 24 mg/dL — ABNORMAL HIGH (ref 8–23)
CO2: 24 mmol/L (ref 22–32)
Calcium: 9.9 mg/dL (ref 8.9–10.3)
Chloride: 92 mmol/L — ABNORMAL LOW (ref 98–111)
Creatinine, Ser: 1.08 mg/dL — ABNORMAL HIGH (ref 0.44–1.00)
GFR, Estimated: 54 mL/min — ABNORMAL LOW (ref >60)
Glucose, Bld: 141 mg/dL — ABNORMAL HIGH (ref 70–99)
Potassium: 3.8 mmol/L (ref 3.5–5.1)
Sodium: 129 mmol/L — ABNORMAL LOW (ref 135–145)

## 2023-08-28 LAB — DIGOXIN LEVEL: Digoxin Level: 1.7 ng/mL (ref 0.8–2.0)

## 2023-08-28 LAB — BRAIN NATRIURETIC PEPTIDE: B Natriuretic Peptide: 369.9 pg/mL — ABNORMAL HIGH (ref 0.0–100.0)

## 2023-08-28 MED ORDER — AMIODARONE HCL 100 MG PO TABS: 100.0000 mg | ORAL_TABLET | Freq: Every day | ORAL | 1 refills | Status: DC

## 2023-08-28 NOTE — Progress Notes (Signed)
HEART & VASCULAR TRANSITION OF CARE CONSULT NOTE     Referring Physician: Dr. Phyllis Ginger, MD Dr. Antoine Poche   Chief Complaint: Heart Failure   HPI: Referred to clinic by Dr. Duke Salvia for heart failure consultation.   Rhonda Marshall is a 73 y.o. female  with HFrEF, persistent atrial fibrillation, CKD 3a (b/l Scr ~1.1) and hyperthyroidism   Echo 2018 EF 40%. Nuclear stress test showed no ischemia.  Echo 2023, EF 30-35%  Echo 01/2023 EF 35-40%, RV normal, Mod MR Echo 9/24 EF 30-35%, severe LAE, mild MR, RV normal   Admitted to [facility] 2/25 for acute on chronic HFrEF and atrial fibrillation with RVR. In the ED was hypoxic and dyspneic and was placed on BiPAP. CXR showed pulmonary edema with pleural effusion. BNP>4500. Hs trop 94 and 87. EKG Afib w/ RVR 144 bpm. Echo not updated.    She was diuresed w/ IV Lasix. Afib treated w/ rate control. V-rates improved w/ IV digoxin, continued on amiodarone and metoprolol. Volume status improved w/ IV Lasix, transitioned back to PO.   She presents to [facility] clinic today for assessment. Here w/ her daughter. HR has improved, down to the 60s and at times down to the 40s at home. Her daughter holds her metoprolol when bradycardic. She overall reports improvement in symptoms. Breathing improving. Getting around ok, able to do basic ADLs w/o significant dyspnea. Wt has been stable at home. No LEE. Reports full med compliance. BP is stable 128/81.     Past Medical History:  Diagnosis Date   Anxiety    Atrial fibrillation (HCC)    newly diagnosed 10/2010   Family history of adverse reaction to anesthesia    Sister's BP drops    Hip fracture (HCC) 01/18/2022   Hypertension    Rheumatoid arthritis(714.0)    Subclinical hyperthyroidism     Current Outpatient Medications  Medication Sig Dispense Refill   acetaminophen (TYLENOL) 500 MG tablet Take 500 mg by mouth as needed for moderate pain.     ALPRAZolam (XANAX) 1 MG tablet Take 1 mg  by mouth 4 (four) times daily as needed for anxiety.     diclofenac Sodium (VOLTAREN) 1 % GEL Apply 2 g topically 4 (four) times daily as needed.     digoxin (LANOXIN) 0.125 MG tablet Take 1 tablet (0.125 mg total) by mouth daily. (Patient taking differently: Take 0.0625 mg by mouth daily.) 30 tablet 0   docusate sodium (COLACE) 100 MG capsule Take 1 capsule (100 mg total) by mouth 2 (two) times daily as needed for mild constipation. 10 capsule 0   ELIQUIS 5 MG TABS tablet Take 1 tablet (5 mg total) by mouth 2 (two) times daily. 180 tablet 1   furosemide (LASIX) 40 MG tablet Take 1 tablet (40 mg total) by mouth daily. 30 tablet 0   loperamide (IMODIUM A-D) 2 MG tablet Take 2 mg by mouth 4 (four) times daily as needed for diarrhea or loose stools.     methimazole (TAPAZOLE) 5 MG tablet Take 1 tablet (5 mg total) by mouth daily. 90 tablet 3   metoprolol succinate (TOPROL-XL) 50 MG 24 hr tablet Take 1 tablet (50 mg total) by mouth 2 (two) times daily. Take with or immediately following a meal. 60 tablet 0   Multiple Vitamin (MULTIVITAMIN WITH MINERALS) TABS tablet Take 1 tablet by mouth daily.      oxyCODONE-acetaminophen (PERCOCET) 10-325 MG tablet Take 1 tablet by mouth every 4 (four) hours  as needed for pain. 30 tablet 0   Vitamin D, Ergocalciferol, (DRISDOL) 1.25 MG (50000 UNIT) CAPS capsule Take 50,000 Units by mouth once a week.     amiodarone (PACERONE) 100 MG tablet Take 1 tablet (100 mg total) by mouth daily. 30 tablet 1   No current facility-administered medications for this encounter.    Allergies  Allergen Reactions   Plaquenil [Hydroxychloroquine] Other (See Comments)    "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"    Clindamycin/Lincomycin Rash   Latex Rash    Latex gloves      Social History   Socioeconomic History   Marital status: Widowed    Spouse name: Not on file   Number of children: 1   Years of education: Not on file   Highest education level: Not on file  Occupational History    Occupation: laborer    Comment: [employer]   Occupation: retired  Tobacco Use   Smoking status: Former    Current packs/day: 0.00    Average packs/day: 0.3 packs/day for 54.2 years (13.6 ttl pk-yrs)    Types: Cigarettes    Start date: 9    Quit date: 10/2019    Years since quitting: 3.8    Passive exposure: Past   Smokeless tobacco: Never  Vaping Use   Vaping status: Never Used  Substance and Sexual Activity   Alcohol use: Not Currently    Alcohol/week: 0.0 standard drinks of alcohol   Drug use: No   Sexual activity: Not on file  Other Topics Concern   Not on file  Social History Narrative   Not on file   Social Drivers of Health   Financial Resource Strain: Low Risk  (08/18/2023)   Overall Financial Resource Strain (CARDIA)    Difficulty of Paying Living Expenses: Not very hard  Food Insecurity: Patient Declined (08/17/2023)   Hunger Vital Sign    Worried About Running Out of Food in the Last Year: Patient declined    Ran Out of Food in the Last Year: Patient declined  Transportation Needs: Patient Declined (08/17/2023)   PRAPARE - Administrator, Civil Service (Medical): Patient declined    Lack of Transportation (Non-Medical): Patient declined  Physical Activity: Not on file  Stress: Not on file  Social Connections: Moderately Integrated (08/17/2023)   Social Connection and Isolation Panel [NHANES]    Frequency of Communication with Friends and Family: Once a week    Frequency of Social Gatherings with Friends and Family: More than three times a week    Attends Religious Services: 1 to 4 times per year    Active Member of [employer] or Organizations: No    Attends Banker Meetings: 1 to 4 times per year    Marital Status: Widowed  Intimate Partner Violence: Not At Risk (08/17/2023)   Humiliation, Afraid, Rape, and Kick questionnaire    Fear of Current or Ex-Partner: No    Emotionally Abused: No    Physically Abused: No    Sexually Abused: No       Family History  Problem Relation Age of Onset   Heart attack Mother 39       pacemaker age 81   Hypothyroidism Mother    Cancer Father        colon and prostate   Hypothyroidism Sister    Cancer Sister    Diabetes Brother    CAD Brother 24   Cancer Brother        Renal  Vitals:   08/28/23 1234  BP: 128/81  Pulse: 82  SpO2: 96%  Weight: 41.7 kg (92 lb)    PHYSICAL EXAM: General:  thin/frail appearing female, ambulating w/ walker. No respiratory difficulty HEENT: normal Neck: supple. no JVD. Carotids 2+ bilat; no bruits. No lymphadenopathy or thryomegaly appreciated. Cor: PMI nondisplaced. Regular rate & rhythm. No rubs, gallops or murmurs. Lungs: clear Abdomen: soft, nontender, nondistended. No hepatosplenomegaly. No bruits or masses. Good bowel sounds. Extremities: no cyanosis, clubbing, rash, edema Neuro: alert & oriented x 3, cranial nerves grossly intact. moves all 4 extremities w/o difficulty. Affect pleasant.  ECG: not performed    ASSESSMENT & PLAN:  1. Chronic Systolic Heart Failure Echo 2018 EF 40%. Nuclear stress test showed no ischemia.  Echo 2023, EF 30-35%  Echo 01/2023 EF 35-40%, RV normal, Mod MR Echo 9/24 EF 30-35%, severe LAE, mild MR, RV normal  - recent admit for a/c CHF w/ volume overload in setting of rapid afib. Diuresed well - euvolemic on exam today, NYHA class II - continue Lasix 40 mg daily. Check BMP and BNP today  - continue digoxin 0.125. check dig level  - continue toprol XL 50 mg daily   2. Chronic Afib - recent RVR, likely triggered by volume overload. Rates improved w/ diuresis and up titration of AV nodal blockers - HR now in 60s but occasional bradycardia down to the 40s at home - since she is in permanent Afib, will opt to reduced amiodarone dose to 100 mg and keep metoprolol and digoxin on board for HF benefit  - if continued issues w/ bradycardia, would recommend stopping amiodarone all together  - Eliquis 5 mg bid        Referred to HFSW (PCP, Medications, Transportation, ETOH Abuse, Drug Abuse, Insurance, Surveyor, quantity ):  No Refer to Pharmacy:  No Refer to Home Health: No Refer to Advanced [facility] Clinic: No  Refer to General Cardiology: Yes (already established)   Follow up: keep f/u w/ Gen cards in 4 wks   Reese Senk Sharol Harness, PA-C 08/28/2023

## 2023-08-28 NOTE — Telephone Encounter (Signed)
Spoke with patient and patients daughter in law (ok per patient) regarding the following results. Patient made aware and patient verbalized understanding.   Patient's repeat labs ordered and scheduled for patient.   Advised patient to call back to office with any issues, questions, or concerns. Patient verbalized understanding.

## 2023-08-28 NOTE — Patient Instructions (Signed)
Medication Changes:  DECREASE AMIODARONE TO 100MG  ONCE DAILY   Lab Work:  Labs done today, your results will be available in MyChart, we will contact you for abnormal readings.  Follow-Up in: AS SCHEDULED WITH GENERAL CARDIOLOGY   At the [facility], you and your health needs are our priority. We have a designated team specialized in the treatment of Heart Failure. This Care Team includes your primary Heart Failure Specialized Cardiologist (physician), Advanced Practice Providers (APPs- Physician Assistants and Nurse Practitioners), and Pharmacist who all work together to provide you with the care you need, when you need it.   You may see any of the following providers on your designated Care Team at your next follow up:  Dr. Arvilla Meres Dr. Marca Ancona Dr. Dorthula Nettles Dr. Theresia Bough Tonye Becket, NP [name] Lis, [place] [facility] P[place], [place] Brynda Peon, NP [place] Lee, NP Karle Plumber, PharmD   Please be sure to bring in all your medications bottles to every appointment.   Need to Contact us:  If you have any questions or concerns before your next appointment please send [place] a message through [place] or call our office at [phone].    TO LEAVE A MESSAGE FOR THE NURSE SELECT OPTION 2, PLEASE LEAVE A MESSAGE INCLUDING: YOUR NAME DATE OF BIRTH CALL BACK NUMBER REASON FOR CALL**this is important as we prioritize the call backs  YOU WILL RECEIVE A CALL BACK THE SAME DAY AS LONG AS YOU CALL BEFORE 4:00 PM

## 2023-08-28 NOTE — Addendum Note (Signed)
Addended by: Bea Laura B on: 08/28/2023 04:37 PM   Modules accepted: Orders

## 2023-08-28 NOTE — Telephone Encounter (Signed)
-----   Message from [place] sent at 08/28/2023  4:29 PM EST ----- Dig level elevated. Recommend f/u trough level on Monday. SCr and K stable. Recommend she continue Lasix 40 mg daily. Repeat BMP again on Monday as well.

## 2023-08-31 ENCOUNTER — Ambulatory Visit (HOSPITAL_COMMUNITY)
Admission: RE | Admit: 2023-08-31 | Discharge: 2023-08-31 | Disposition: A | Discharge status: Home / Self Care | Payer: 59 | Source: Ambulatory Visit | Attending: Cardiology | Admitting: Cardiology

## 2023-08-31 LAB — BASIC METABOLIC PANEL
Anion gap: 12 (ref 5–15)
BUN: 21 mg/dL (ref 8–23)
CO2: 25 mmol/L (ref 22–32)
Calcium: 9.9 mg/dL (ref 8.9–10.3)
Chloride: 94 mmol/L — ABNORMAL LOW (ref 98–111)
Creatinine, Ser: 0.97 mg/dL (ref 0.44–1.00)
GFR, Estimated: >60 mL/min (ref >60)
Glucose, Bld: 153 mg/dL — ABNORMAL HIGH (ref 70–99)
Potassium: 3.8 mmol/L (ref 3.5–5.1)
Sodium: 131 mmol/L — ABNORMAL LOW (ref 135–145)

## 2023-08-31 LAB — DIGOXIN LEVEL: Digoxin Level: 1.1 ng/mL (ref 0.8–2.0)

## 2023-09-02 ENCOUNTER — Encounter (HOSPITAL_COMMUNITY): Payer: Self-pay

## 2023-09-02 ENCOUNTER — Telehealth (HOSPITAL_COMMUNITY): Payer: Self-pay

## 2023-09-02 NOTE — Telephone Encounter (Signed)
Spoke with patient regarding the following results. Patient made aware and patient verbalized understanding.   Medication list updated to reflect changes.

## 2023-09-02 NOTE — Telephone Encounter (Signed)
-----   Message from [place] sent at 08/31/2023  2:36 PM EST ----- Dig level remains elevated on repeat trough. Already on low dose. Instruct to stop digoxin.

## 2023-09-14 ENCOUNTER — Encounter: Payer: Self-pay | Admitting: Cardiology

## 2023-09-16 ENCOUNTER — Other Ambulatory Visit: Payer: Self-pay | Admitting: Internal Medicine

## 2023-09-16 DIAGNOSIS — M05711 Rheumatoid arthritis with rheumatoid factor of right shoulder without organ or systems involvement: Secondary | ICD-10-CM

## 2023-09-16 NOTE — Telephone Encounter (Signed)
 Attempted to contact the patient to see if she is still taking the medication.

## 2023-09-20 ENCOUNTER — Encounter: Payer: Self-pay | Admitting: Cardiology

## 2023-09-22 MED ORDER — FUROSEMIDE 20 MG PO TABS: 20.0000 mg | ORAL_TABLET | Freq: Every day | ORAL | 3 refills | Status: DC

## 2023-09-22 NOTE — Telephone Encounter (Signed)
 Called patient's daughter Elease Hashimoto. Verified name and DOB. Notified Elease Hashimoto about Dr. Jenene Slicker recommendations:  We can decrease the Lasix to 20 mg PO daily.  The most important thing will be keeping her water intake to a minimum.  She needs to really restrict this and fluid intake to 1200 cc per day which is about 42 ounces.   Patient daughter stated being all out of medication. Verified pharmacy is [facility] on [address]. Let daughter know new prescription will be sent to pharmacy. Patient's daughter verbalized understanding.   Josie LPN

## 2023-09-23 ENCOUNTER — Ambulatory Visit: Payer: 59 | Attending: Physician Assistant | Admitting: Cardiology

## 2023-09-23 ENCOUNTER — Encounter: Payer: Self-pay | Admitting: Physician Assistant

## 2023-09-23 VITALS — BP 110/80 | HR 69 | Ht 61.0 in

## 2023-09-23 DIAGNOSIS — Z79899 Other long term (current) drug therapy: Secondary | ICD-10-CM

## 2023-09-23 DIAGNOSIS — E871 Hypo-osmolality and hyponatremia: Secondary | ICD-10-CM

## 2023-09-23 DIAGNOSIS — I5022 Chronic systolic (congestive) heart failure: Secondary | ICD-10-CM | POA: Diagnosis not present

## 2023-09-23 DIAGNOSIS — I4821 Permanent atrial fibrillation: Secondary | ICD-10-CM | POA: Diagnosis not present

## 2023-09-23 LAB — BASIC METABOLIC PANEL
BUN/Creatinine Ratio: 19 (ref 12–28)
BUN: 23 mg/dL (ref 8–27)
CO2: 23 mmol/L (ref 20–29)
Calcium: 10.9 mg/dL — ABNORMAL HIGH (ref 8.7–10.3)
Chloride: 97 mmol/L (ref 96–106)
Creatinine, Ser: 1.2 mg/dL — ABNORMAL HIGH (ref 0.57–1.00)
Glucose: 83 mg/dL (ref 70–99)
Potassium: 5.2 mmol/L (ref 3.5–5.2)
Sodium: 134 mmol/L (ref 134–144)
eGFR: 48 mL/min/{1.73_m2} — ABNORMAL LOW (ref >59)

## 2023-09-23 MED ORDER — SODIUM CHLORIDE 1 G PO TABS: 1.0000 g | ORAL_TABLET | Freq: Every day | ORAL | 3 refills | Status: DC

## 2023-09-23 NOTE — Progress Notes (Signed)
 Cardiology Office Note:  .   Date:  09/23/2023  ID:  Vonya Ohalloran, DOB 1951/01/03, MRN [id number] PCP: Knox Royalty, MD  [facility] Providers Cardiologist:  Rollene Rotunda, MD {  History of Present Illness: .   Jasamine Elrod is a 73 y.o. female chronic HFrEF, LBBB, permanent atrial fibrillation, CKD 3 , hypothyroidism, RA, frequent UTIs.  Echo 2018 EF 40%. Nuclear stress test showed no ischemia.  Echo 2023, EF 30-35%  Echo 01/2023 EF 35-40%, RV normal, Mod MR Echo 9/24 EF 30-35%, severe LAE, mild MR, RV normal   Ms. Sliger has history of atrial fibrillation dating back in 2016 when she had a cardioversion. 01/2022 underwent DCCV on amiodarone but had return back to atrial fibrillation.  Now she primarily on rate control.  Additionally, she has history of chronic HFrEF thought to be nonischemic due to reported normal Lexi view in 2018.  EF has generally been around 30 to 35% over the years.  May be due to persistent A-fib.  GDMT limited by hypotension.  In the past year in September 2024 and then February 2025, she has had hospitalizations for CHF exacerbations/A-fib RVR.    During her last hospitalization she was in decompensated heart failure exacerbation with hypoxia and dyspnea requiring BiPAP.  BNP greater than 4500+ and was in RVR.  Rates are improved with IV digoxin, amiodarone, metoprolol.  She saw heart failure TOC clinic on the 02/14 with controlled rates, at times bradycardic so daughter was intermittently holding her metoprolol.  Amiodarone decreased to 100 mg daily.  But overall significant improvement in symptoms.  Unfortunately lab work starting to show digoxin toxicity with signs of bradycardia so this was stopped.  Then she started having signs of infection from a pressure ulcer on her middle toe and went to an urgent care at [facility].  Chest x-ray indicating possible pneumonia and incisional edema, pulmonary infection versus interstitial lung  disease.  Treated with doxycycline.  Her Lasix was also reduced from 40 to 20 mg due to telephone encounter reporting hypotension.  Today she is accompanied with her daughter who has brought in previous visit summary and lab work showing a sodium of 125 that was completed on March 4.  Her daughter, whose name I think is Elease Hashimoto, looks after her and does an exceptional job charting all of her vital signs, does her medicine and looks very closely after her.  She reports that on occasion patient has been hypotensive with systolics in the high 80s.  Weight has been stable, baseline suspect to be around 90 pounds.  Heart rates have been well-controlled.  She reports patient has not been drinking very much lately probably less than 30 fluid ounces each day.  Admits to mild signs of confusion though patient seems to be at baseline.  No nausea, diarrhea, signs of seizures, dizziness, headache.  Still has persistent cough.  Overall feeling well but a little bit fatigued.  Volume status has been stable per daughter   ROS: Denies: Chest pain, shortness of breath, orthopnea, peripheral edema, palpitations, decreased exercise intolerance,   Studies Reviewed: .        Cardiac Studies & Procedures   ______________________________________________________________________________________________   STRESS TESTS  MYOCARDIAL PERFUSION IMAGING 04/29/2017  Narrative  This is a low risk study.  Low risk stress nuclear study with no ischemia or infarction; study not gated due to atrial fibrillation.   ECHOCARDIOGRAM  ECHOCARDIOGRAM COMPLETE 03/30/2023  Narrative ECHOCARDIOGRAM REPORT    Patient  Name:   JOSANNE BOEREMA Date of Exam: 03/30/2023 Medical Rec #:  [id number]        Height:       61.0 in Accession #:    [id number]       Weight:       107.4 lb Date of Birth:  07-14-1951        BSA:          1.450 m Patient Age:    72 years         BP:           128/100 mmHg Patient Gender: F                HR:            67 bpm. Exam Location:  Inpatient  Procedure: 2D Echo, 3D Echo, Cardiac Doppler and Color Doppler  Indications:    I50.40* Unspecified combined systolic (congestive) and diastolic (congestive) heart failure  History:        Patient has prior history of Echocardiogram examinations, most recent 02/04/2023. CHF, Abnormal ECG, Arrythmias:Atrial Fibrillation and LBBB, Signs/Symptoms:Chest Pain, Dyspnea and Shortness of Breath; Risk Factors:Current Smoker. Moderate mitral regurgitation.  Sonographer:    Sheralyn Boatman RDCS Referring Phys: [id number] [name]  IMPRESSIONS   1. Left ventricular ejection fraction, by estimation, is 30 to 35%. The left ventricle has moderately decreased function. The left ventricle demonstrates global hypokinesis. Left ventricular diastolic parameters are indeterminate. 2. Right ventricular systolic function is normal. The right ventricular size is normal. 3. Left atrial size was severely dilated. 4. Right atrial size was moderately dilated. 5. Moderate pleural effusion in the left lateral region. 6. The mitral valve is degenerative. Mild mitral valve regurgitation. No evidence of mitral stenosis. 7. The aortic valve is tricuspid. Aortic valve regurgitation is not visualized. Aortic valve sclerosis is present, with no evidence of aortic valve stenosis.  FINDINGS Left Ventricle: Left ventricular ejection fraction, by estimation, is 30 to 35%. The left ventricle has moderately decreased function. The left ventricle demonstrates global hypokinesis. The left ventricular internal cavity size was normal in size. There is no left ventricular hypertrophy. Abnormal (paradoxical) septal motion, consistent with left bundle branch block. Left ventricular diastolic parameters are indeterminate.  Right Ventricle: The right ventricular size is normal. Right ventricular systolic function is normal.  Left Atrium: Left atrial size was severely dilated.  Right  Atrium: Right atrial size was moderately dilated.  Pericardium: There is no evidence of pericardial effusion.  Mitral Valve: The mitral valve is degenerative in appearance. Mild mitral valve regurgitation. No evidence of mitral valve stenosis.  Tricuspid Valve: The tricuspid valve is normal in structure. Tricuspid valve regurgitation is mild . No evidence of tricuspid stenosis.  Aortic Valve: The aortic valve is tricuspid. Aortic valve regurgitation is not visualized. Aortic valve sclerosis is present, with no evidence of aortic valve stenosis.  Pulmonic Valve: The pulmonic valve was grossly normal. Pulmonic valve regurgitation is mild. No evidence of pulmonic stenosis.  Aorta: The aortic root and ascending aorta are structurally normal, with no evidence of dilitation.  IAS/Shunts: No atrial level shunt detected by color flow Doppler.  Additional Comments: There is a moderate pleural effusion in the left lateral region.   LEFT VENTRICLE PLAX 2D LVIDd:         4.05 cm LVIDs:         3.60 cm LV PW:         1.25 cm LV IVS:  1.00 cm LVOT diam:     2.20 cm LV SV:         53 LV SV Index:   36 LVOT Area:     3.80 cm  LV Volumes (MOD) LV vol d, MOD A2C: 53.2 ml LV vol d, MOD A4C: 57.9 ml LV vol s, MOD A2C: 35.1 ml LV vol s, MOD A4C: 36.5 ml LV SV MOD A2C:     18.1 ml LV SV MOD A4C:     57.9 ml LV SV MOD BP:      19.9 ml  RIGHT VENTRICLE            IVC RV S prime:     7.94 cm/s  IVC diam: 1.80 cm TAPSE (M-mode): 0.9 cm  LEFT ATRIUM             Index        RIGHT ATRIUM           Index LA diam:        4.90 cm 3.38 cm/m   RA Area:     17.90 cm LA Vol (A2C):   61.3 ml 42.28 ml/m  RA Volume:   46.10 ml  31.79 ml/m LA Vol (A4C):   71.7 ml 49.45 ml/m LA Biplane Vol: 65.8 ml 45.38 ml/m AORTIC VALVE             PULMONIC VALVE LVOT Vmax:   94.00 cm/s  PR End Diast Vel: 1.83 msec LVOT Vmean:  64.950 cm/s LVOT VTI:    0.138 m  AORTA Ao Root diam: 3.10 cm Ao Asc diam:   3.60 cm  MITRAL VALVE               TRICUSPID VALVE MV Area (PHT): 5.54 cm    TR Peak grad:   22.1 mmHg MV Decel Time: 137 msec    TR Vmax:        235.00 cm/s MV E velocity: 98.10 cm/s SHUNTS Systemic VTI:  0.14 m Systemic Diam: 2.20 cm  Olga Millers MD Electronically signed by Olga Millers MD Signature Date/Time: 03/30/2023/12:01:03 PM    Final   TEE  ECHO TEE 01/22/2022  Narrative TRANSESOPHOGEAL ECHO REPORT    Patient Name:   AMRIT ERCK Date of Exam: 01/22/2022 Medical Rec #:  [id number]        Height:       61.0 in Accession #:    [id number]       Weight:       110.9 lb Date of Birth:  1950/08/30        BSA:          1.470 m Patient Age:    71 years         BP:           94/83 mmHg Patient Gender: F                HR:           108 bpm. Exam Location:  Inpatient  Procedure: 3D Echo, Transesophageal Echo, Cardiac Doppler and Color Doppler  Indications:     R94.31 Abnormal EKG; I48.91* Unspeicified atrial fibrillation  History:         Patient has prior history of Echocardiogram examinations, most recent 01/19/2022. Abnormal ECG, Arrythmias:Atrial Fibrillation and LBBB, Signs/Symptoms:Altered Mental Status, Chest Pain and Dyspnea; Risk Factors:Current Smoker.  Sonographer:     Sheralyn Boatman RDCS Referring Phys:  [id number] Kathlynn Grate [place] Diagnosing Phys: Laurance Flatten  MD  PROCEDURE: After discussion of the risks and benefits of a TEE, an informed consent was obtained from the patient. The transesophogeal probe was passed without difficulty through the esophogus of the patient. Imaged were obtained with the patient in a left lateral decubitus position. Sedation performed by different physician. The patient was monitored while under deep sedation. Anesthestetic sedation was provided intravenously by Anesthesiology: 153mg  of Propofol. The patient's vital signs; including heart rate, blood pressure, and oxygen saturation; remained stable throughout the  procedure. The patient developed no complications during the procedure. A successful direct current cardioversion was performed at 200 joules with 2 attempts.  IMPRESSIONS   1. Left ventricular ejection fraction, by estimation, is 30 to 35%. The left ventricle has moderately decreased function. 2. Right ventricular systolic function is mildly reduced. The right ventricular size is normal. 3. Left atrial size was moderately dilated. There was smoke in the left atrial appendage without evidence of thrombus. 4. Right atrial size was moderately dilated. 5. The mitral valve is abnormal. There is mild thickening and calcification of the mitral valve leaflets. There is mild prolapse of the A2 and P2 scallops of the mitral valve with resultant mild mitral regurgitation. 6. The aortic valve is tricuspid. There is mild calcification of the aortic valve. There is mild thickening of the aortic valve. Aortic valve regurgitation is not visualized. 7. There is grade IV plaque at the level of the aortic arch. Aortic dilatation noted. There is borderline dilatation of the ascending aorta, measuring 36 mm.  FINDINGS Left Ventricle: Left ventricular ejection fraction, by estimation, is 30 to 35%. The left ventricle has moderately decreased function. The left ventricular internal cavity size was normal in size.  Right Ventricle: The right ventricular size is normal. No increase in right ventricular wall thickness. Right ventricular systolic function is mildly reduced.  Left Atrium: Left atrial size was moderately dilated. No left atrial/left atrial appendage thrombus was detected.  Right Atrium: Right atrial size was moderately dilated.  Pericardium: There is no evidence of pericardial effusion.  Mitral Valve: The mitral valve is abnormal. There is mild prolapse of A2 and P2 scallops of the mitral valve. There is mild thickening of the mitral valve leaflet(s). There is mild calcification of the mitral valve  leaflet(s). Mild mitral valve regurgitation.  Tricuspid Valve: The tricuspid valve is normal in structure. Tricuspid valve regurgitation is trivial.  Aortic Valve: The aortic valve is tricuspid. There is mild calcification of the aortic valve. There is mild thickening of the aortic valve. Aortic valve regurgitation is not visualized.  Pulmonic Valve: The pulmonic valve was normal in structure. Pulmonic valve regurgitation is mild to moderate.  Aorta: There is grade IV plaque at the level of the aortic arch. Aortic dilatation noted. There is borderline dilatation of the ascending aorta, measuring 36 mm.  IAS/Shunts: The atrial septum is grossly normal.    AORTA Ao Asc diam: 3.60 cm  MR Peak grad:    86.1 mmHg MR Mean grad:    45.0 mmHg MR Vmax:         464.00 cm/s MR Vmean:        308.0 cm/s MR PISA:         1.57 cm MR PISA Eff ROA: 13 mm MR PISA Radius:  0.50 cm  Laurance Flatten MD Electronically signed by Laurance Flatten MD Signature Date/Time: 01/22/2022/4:11:39 PM    Final        ______________________________________________________________________________________________      Risk Assessment/Calculations:  CHA2DS2-VASc Score = 3   This indicates a 3.2% annual risk of stroke. The patient's score is based upon: CHF History: 1 HTN History: 0 Diabetes History: 0 Stroke History: 0 Vascular Disease History: 0 Age Score: 1 Gender Score: 1      Physical Exam:   VS:  BP 110/80 (BP Location: Left Arm, Patient Position: Sitting, Cuff Size: Normal)   Pulse 69   Ht 5\' 1"  (1.549 m)   SpO2 98%   BMI 17.38 kg/m    Wt Readings from Last 3 Encounters:  08/28/23 92 lb (41.7 kg)  08/17/23 98 lb 1.7 oz (44.5 kg)  08/05/23 115 lb 3.2 oz (52.3 kg)    GEN: Well nourished, well developed in no acute distress.  Frail.  Cough NECK: No JVD; No carotid bruits CARDIAC: Irregularly irregular.  No murmurs RESPIRATORY:  Clear to auscultation without rales, wheezing or  rhonchi  ABDOMEN: Soft, non-tender, non-distended EXTREMITIES:  No edema; No deformity   Decreased skin turgor  ASSESSMENT AND PLAN: .    Hypovolemic hyponatremia She has had a precipitous drop in sodium levels likely due to poor p.o hydration/diuretic.  On March 4 lab work sodium was 125.  Daughter reports poor hydration less than 30 fluid ounces a day. Generally asx. Discussed with pharmacy team and advanced heart failure.  Getting stat BMP if sodium is less than 120 we will immediately sent to the emergency room.  Or if she has any signs of neurological dysfunction.  We discussed ER precautions at great lengths. In the setting of diuretic use and strong family support I would really like to avoid hospitalization and we will aim to treat outpatient if sodium levels permit. Will start 1 g of sodium tablets daily for the time being.  I will repeat BMP in 48 hours, being cognizant of rapid correction and osmotic demyelination syndrome although rare.  Likely after that we will check every 5 to 7 days until back within normal limits. Stop Lasix.  Avoid pure free water, but encouraged electrolyte drinks  Chronic HFrEF 30-35% Presumed nonischemic cardiomyopathy Echo 2018 EF 40%. Nuclear stress test showed no ischemia.  Echo 2023, EF 30-35%  Echo 01/2023 EF 35-40%, RV normal, Mod MR Echo 9/24 EF 30-35%, severe LAE, mild MR, RV normal   Dehydrated.  Has had frequent hospitalizations over the past 6 months to year.  She is very frail, tenuous, and high risk for rehospitalization's for variety of issues.  Fortunately has a very strong support system with a daughter that does an exceptional job taking care of her.  GDMT: Continue Toprol-XL 50 mg.  GDMT limited by hypotension (did not tolerate spironolactone/Entresto) No SGLT2 inhibitor due to frequent UTIs. Dry weight may be around 90 to 92 pounds.  Permanent atrial fibrillation Rate control strategies now.  Reasonably controlled today and on blood  pressure log. Continue Eliquis 5 mg twice daily, beta-blocker as above. Continue amiodarone 100 mg.   Had digoxin toxicity when this was taken with amiodarone. In the future would avoid altogether given their drug interaction and are very frail and sensitive state. Continue to monitor very closely reduce dosing criteria.    CKD In the setting minimal muscle mass, creatinine and renal function likely to be a gross overestimation of her renal function    Dispo: 2 weeks  Signed, Abagail Kitchens, PA-C

## 2023-09-23 NOTE — Patient Instructions (Signed)
 Medication Instructions:  STOP LASIX   START SODIUM CHLORIDE TABLET 1 GRAM DAILY UNTIL INSTRUCTED OTHERWISE  *If you need a refill on your cardiac medications before your next appointment, please call your pharmacy*   Lab Work: BMP TODAY   BMP IN 48 HOURS If you have labs (blood work) drawn today and your tests are completely normal, you will receive your results only by: MyChart Message (if you have MyChart) OR A paper copy in the mail If you have any lab test that is abnormal or we need to change your treatment, we will call you to review the results.   Testing/Procedures: NO TESTING   Follow-Up: At [facility], you and your health needs are our priority.  As part of our continuing mission to provide you with exceptional heart care, we have created designated Provider Care Teams.  These Care Teams include your primary Cardiologist (physician) and Advanced Practice Providers (APPs -  Physician Assistants and Nurse Practitioners) who all work together to provide you with the care you need, when you need it.  Your next appointment:   2 week(s)  Provider:   Azalee Course, PA  Other Instructions

## 2023-09-24 NOTE — Addendum Note (Signed)
 Addended by: Corey Harold T on: 09/24/2023 12:28 PM   Modules accepted: Orders

## 2023-10-06 ENCOUNTER — Ambulatory Visit: Attending: Physician Assistant | Admitting: Physician Assistant

## 2023-10-06 ENCOUNTER — Encounter: Payer: Self-pay | Admitting: Physician Assistant

## 2023-10-06 VITALS — BP 138/84 | HR 88 | Ht 61.5 in | Wt 94.8 lb

## 2023-10-06 DIAGNOSIS — I5022 Chronic systolic (congestive) heart failure: Secondary | ICD-10-CM | POA: Diagnosis not present

## 2023-10-06 DIAGNOSIS — I4821 Permanent atrial fibrillation: Secondary | ICD-10-CM

## 2023-10-06 DIAGNOSIS — E871 Hypo-osmolality and hyponatremia: Secondary | ICD-10-CM | POA: Diagnosis not present

## 2023-10-06 MED ORDER — SODIUM CHLORIDE 1 G PO TABS: 1.0000 g | ORAL_TABLET | ORAL | Status: DC | PRN

## 2023-10-06 MED ORDER — FUROSEMIDE 20 MG PO TABS: 20.0000 mg | ORAL_TABLET | ORAL | Status: DC | PRN

## 2023-10-06 NOTE — Progress Notes (Unsigned)
 Cardiology Office Note:  .   Date:  10/06/2023  ID:  Rhonda Marshall, DOB 10-23-1950, MRN [id number] PCP: Knox Royalty, MD  [facility] Providers Cardiologist:  Rollene Rotunda, MD { Click to update primary MD,subspecialty MD or APP then REFRESH:1}   History of Present Illness: .   Rhonda Marshall is a 73 y.o. female with past medical history of HFrEF, permanent atrial fibrillation, CKD stage III and hypothyroidism.  Echocardiogram in 2018 showed EF 40%, Myoview showed no ischemia.  Repeat echocardiogram in 2023 showed EF 30 to 35%.  Echocardiogram in September 2024 showed EF 30 to 35%, severe RAE, mild MR, normal RV.  Patient was admitted to [facility] long hospital in February 2025 with acute on chronic systolic heart failure and A-fib with RVR.  She required BiPAP on arrival.  Chest x-ray showed pulmonary edema with pleural effusion.  BNP elevated at 4500.  Serial troponin mildly elevated but remained flat.  EKG showed the A-fib with RVR with heart rate in the 140s.  She underwent IV diuresis.  A-fib was treated with rate control.  Digoxin added and she was continued on amiodarone and metoprolol.  Since discharge, she has been seen by heart failure TOC clinic in February 2025, repeat BNP was 369.9.  Creatinine was 1.08 which was stable.  She was continued on 40 mg daily of Lasix.  Heart rate was very well-controlled, amiodarone reduced to 100 mg daily.  There was some concern of bradycardia with heart rate dipping down to the 40s.  Digoxin level remained elevated on repeat trough, digoxin was eventually discontinued.  She was seen at [facility] urgent care, chest x-ray showed possible pneumonia and she was treated with doxycycline.  Lasix was reduced back to 20 mg daily due to hypotension.  She was seen by Yvonna Alanis PA-C on 09/23/2023, it was concerning that she was hyponatremic on the previous blood work.  Repeat basic metabolic panel however shows significant improvement in the sodium level.   She was given salt tablet.  Lasix stopped.  Dry weight was felt to be around 90 to 92 pounds.  Patient presents today for follow-up.  Since the last visit, she has been doing much better.  Heart rate is controlled, no sign of bradycardia at home.  She feels stronger as well.  She has gained a little bit of weight however has better appetite now, therefore this is true weight gain.  She does not appear to be volume overloaded on physical exam.  We will change the Lasix to as needed.  Based on the recent lab work, she no longer needs a salt tablet, we will change the sodium chloride tablet to as needed as well.  Overall, she has been doing well and can follow-up with Dr. Antoine Poche in 3 to 25-month.  Her daughter is monitoring her weight at home and make sure she eat a healthy diet.  ROS: ***  Studies Reviewed: .        *** Risk Assessment/Calculations:   {Does this patient have ATRIAL FIBRILLATION?:[phone]}         Physical Exam:   VS:  BP 138/84   Pulse 88   Ht 5' 1.5" (1.562 m)   Wt 94 lb 12.8 oz (43 kg)   SpO2 98%   BMI 17.62 kg/m    Wt Readings from Last 3 Encounters:  10/06/23 94 lb 12.8 oz (43 kg)  08/28/23 92 lb (41.7 kg)  08/17/23 98 lb 1.7 oz (44.5 kg)  GEN: Well nourished, well developed in no acute distress NECK: No JVD; No carotid bruits CARDIAC: ***RRR, no murmurs, rubs, gallops RESPIRATORY:  Clear to auscultation without rales, wheezing or rhonchi  ABDOMEN: Soft, non-tender, non-distended EXTREMITIES:  No edema; No deformity   ASSESSMENT AND PLAN: .   ***    {Are you ordering a CV Procedure (e.g. stress test, cath, DCCV, TEE, etc)?   Press F2        :[id number]}  Dispo: ***  Signed, Azalee Course, PA

## 2023-10-06 NOTE — Patient Instructions (Signed)
 Medication Instructions:  NO CHANGES *If you need a refill on your cardiac medications before your next appointment, please call your pharmacy*   Lab Work: NO LABS If you have labs (blood work) drawn today and your tests are completely normal, you will receive your results only by: MyChart Message (if you have MyChart) OR A paper copy in the mail If you have any lab test that is abnormal or we need to change your treatment, we will call you to review the results.   Testing/Procedures: NO TESTING   Follow-Up: At [facility], you and your health needs are our priority.  As part of our continuing mission to provide you with exceptional heart care, we have created designated Provider Care Teams.  These Care Teams include your primary Cardiologist (physician) and Advanced Practice Providers (APPs -  Physician Assistants and Nurse Practitioners) who all work together to provide you with the care you need, when you need it.   Your next appointment:   3-4 month(s)  Provider:   Rollene Rotunda, MD   Other Instructions

## 2023-10-22 ENCOUNTER — Telehealth: Payer: Self-pay | Admitting: Pharmacy Technician

## 2023-10-22 ENCOUNTER — Other Ambulatory Visit (HOSPITAL_COMMUNITY): Payer: Self-pay

## 2023-10-22 NOTE — Telephone Encounter (Signed)
 We got a prior authorization request for amiodarone 100mg  but per prior authorization, the 200mg  is preferred. Can this be changed to amiodarone 200mg  take 1/2 tablet (100mg ) daily instead? Per test claim if that can be changed it will go through no problem for 0.00.

## 2023-10-23 MED ORDER — AMIODARONE HCL 200 MG PO TABS: 100.0000 mg | ORAL_TABLET | Freq: Every day | ORAL | 3 refills | Status: AC

## 2023-10-23 NOTE — Telephone Encounter (Signed)
 Thank you so much

## 2023-10-23 NOTE — Telephone Encounter (Signed)
 Rx for Amiodarone 200 mg (take 0.5 tablet daily) set to pharmacy

## 2023-11-10 ENCOUNTER — Other Ambulatory Visit: Payer: Self-pay

## 2023-11-16 ENCOUNTER — Other Ambulatory Visit: Payer: 59

## 2023-11-19 ENCOUNTER — Encounter: Payer: Self-pay | Admitting: Endocrinology

## 2023-11-19 ENCOUNTER — Ambulatory Visit (INDEPENDENT_AMBULATORY_CARE_PROVIDER_SITE_OTHER): Payer: 59 | Admitting: Endocrinology

## 2023-11-19 VITALS — BP 110/60 | HR 99 | Resp 20 | Ht 61.5 in | Wt 107.8 lb

## 2023-11-19 DIAGNOSIS — E041 Nontoxic single thyroid nodule: Secondary | ICD-10-CM

## 2023-11-19 DIAGNOSIS — E0590 Thyrotoxicosis, unspecified without thyrotoxic crisis or storm: Secondary | ICD-10-CM

## 2023-11-19 NOTE — Progress Notes (Addendum)
 Outpatient Endocrinology Note Rhonda Kyaire Gruenewald, MD   Patient's Name: Rhonda Marshall    DOB: 05/27/1951    MRN: [id number]  REASON OF VISIT: Follow-up for hyperthyroidism  PCP: Trellis Fries, MD  HISTORY OF PRESENT ILLNESS:   Rhonda Marshall is a 73 y.o. old female with past medical history as listed below is presented for a follow up for hyperthyroidism due to hyperfunctioning thyroid  nodule.   Pertinent Thyroid  History: Patient was previously seen by Dr. Hubert Madden and was last time seen in August 2024.  Review of her records for thyroid  history indicates that she reportedly had subclinical hyperthyroidism in 2012 when she was admitted for new onset atrial fibrillation. Subsequently did not have any treatment or further evaluation. She has been treated for atrial fibrillation by her cardiologist and was recommended evaluation for the thyroid . The patient is a poor historian and on her initial visit she did not give any symptoms of palpitations, shakiness, feeling excessively warm and sweaty or weight loss.  She did complain of nervousness and fatigue.  However she has also had issues with anxiety and depression   Since her baseline TSH was very low along with upper normal free T3 she has been on PTU 50 mg twice a day from October 2017. Baseline thyrotropin receptor antibody was negative. With starting treatment she had less fatigue symptoms.  In August 2018 patient had radioactive iodine  uptake and scan 24-hour uptake was 31% with toxic left thyroid  adenoma probable additional hyperfunctioning nodule on the left upper pole and suppression of uptake within remaining thyroid  tissue.   She was scheduled for I-131 treatment because of the finding of a large hot nodule on the left side. However she has not been able to do this since she cannot be in isolation for a couple of days at home.  In 2018 she was off of antithyroid medication.  Free T3 got elevated 4.3.  She was treated with PTU 50 mg up to 1  to 3 tablets/day.  In April 2024 patient had, mildly low TSH with normal free T4 and T3 T, PTU was changed to methimazole  5 mg daily.    Other: She has chronic atrial fibrillation followed by cardiologist.  Interval history  Patient has been taking methimazole  5 mg daily.  She reports taking in the morning.  No palpitation or heat intolerance.  Patient was hospitalized in February due to heart failure.  TSH was normal at that time.  Overall feeling normal energy.  No other complaints today.   REVIEW OF SYSTEMS:  As per history of present illness.   PAST MEDICAL HISTORY: Past Medical History:  Diagnosis Date   Anxiety    Atrial fibrillation (HCC)    newly diagnosed 10/2010   Family history of adverse reaction to anesthesia    Sister's BP drops    Hip fracture (HCC) 01/18/2022   Hypertension    Rheumatoid arthritis(714.0)    Subclinical hyperthyroidism     PAST SURGICAL HISTORY: Past Surgical History:  Procedure Laterality Date   Arm surgery     fracture, right arm   CARDIOVERSION N/A 08/10/2014   Procedure: CARDIOVERSION;  Surgeon: Liza Riggers, MD;  Location: [facility] ENDOSCOPY;  Service: Cardiovascular;  Laterality: N/A;   CARDIOVERSION N/A 01/22/2022   Procedure: CARDIOVERSION;  Surgeon: Sonny Dust, MD;  Location: [facility] ENDOSCOPY;  Service: Cardiovascular;  Laterality: N/A;   CATARACT EXTRACTION     DIRECT LARYNGOSCOPY N/A 09/02/2019   Procedure: DIRECT LARYNGOSCOPY WITH BIOPSY;  Surgeon: Sean Czar  E, MD;  Location: [facility];  Service: ENT;  Laterality: N/A;   HIP ARTHROPLASTY Right 01/19/2022   Procedure: ARTHROPLASTY BIPOLAR HIP (HEMIARTHROPLASTY);  Surgeon: Osa Blase, MD;  Location: [facility] ORS;  Service: Orthopedics;  Laterality: Right;   IR GASTROSTOMY TUBE MOD SED  10/17/2019   IR GASTROSTOMY TUBE REMOVAL  01/03/2020   TEE WITHOUT CARDIOVERSION N/A 01/22/2022   Procedure: TRANSESOPHAGEAL ECHOCARDIOGRAM (TEE);  Surgeon: Sonny Dust,  MD;  Location: [facility] ENDOSCOPY;  Service: Cardiovascular;  Laterality: N/A;   TUBAL LIGATION      ALLERGIES: Allergies  Allergen Reactions   Plaquenil  [Hydroxychloroquine ] Other (See Comments)    "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"    Clindamycin/Lincomycin Rash   Latex Rash    Latex gloves    FAMILY HISTORY:  Family History  Problem Relation Age of Onset   Heart attack Mother 31       pacemaker age 5   Hypothyroidism Mother    Cancer Father        colon and prostate   Hypothyroidism Sister    Cancer Sister    Diabetes Brother    CAD Brother 74   Cancer Brother        Renal    SOCIAL HISTORY: Social History   Socioeconomic History   Marital status: Widowed    Spouse name: Not on file   Number of children: 1   Years of education: Not on file   Highest education level: Not on file  Occupational History   Occupation: laborer    Comment: [employer]   Occupation: retired  Tobacco Use   Smoking status: Former    Current packs/day: 0.00    Average packs/day: 0.3 packs/day for 54.2 years (13.6 ttl pk-yrs)    Types: Cigarettes    Start date: 70    Quit date: 10/2019    Years since quitting: 4.1    Passive exposure: Past   Smokeless tobacco: Never  Vaping Use   Vaping status: Never Used  Substance and Sexual Activity   Alcohol use: Not Currently    Alcohol/week: 0.0 standard drinks of alcohol   Drug use: No   Sexual activity: Not on file  Other Topics Concern   Not on file  Social History Narrative   Not on file   Social Drivers of Health   Financial Resource Strain: Low Risk  (08/18/2023)   Overall Financial Resource Strain (CARDIA)    Difficulty of Paying Living Expenses: Not very hard  Food Insecurity: Patient Declined (08/17/2023)   Hunger Vital Sign    Worried About Running Out of Food in the Last Year: Patient declined    Ran Out of Food in the Last Year: Patient declined  Transportation Needs: Patient Declined (08/17/2023)   PRAPARE - Therapist, art (Medical): Patient declined    Lack of Transportation (Non-Medical): Patient declined  Physical Activity: Not on file  Stress: Not on file  Social Connections: Moderately Integrated (08/17/2023)   Social Connection and Isolation Panel [NHANES]    Frequency of Communication with Friends and Family: Once a week    Frequency of Social Gatherings with Friends and Family: More than three times a week    Attends Religious Services: 1 to 4 times per year    Active Member of [employer] or Organizations: No    Attends Banker Meetings: 1 to 4 times per year    Marital Status: Widowed    MEDICATIONS:  Current  Outpatient Medications  Medication Sig Dispense Refill   acetaminophen  (TYLENOL ) 500 MG tablet Take 500 mg by mouth as needed for moderate pain.     ALPRAZolam  (XANAX ) 1 MG tablet Take 1 mg by mouth 4 (four) times daily as needed for anxiety.     amiodarone  (PACERONE ) 200 MG tablet Take 0.5 tablets (100 mg total) by mouth daily. 45 tablet 3   diclofenac Sodium (VOLTAREN) 1 % GEL Apply 2 g topically 4 (four) times daily as needed.     docusate sodium  (COLACE) 100 MG capsule Take 1 capsule (100 mg total) by mouth 2 (two) times daily as needed for mild constipation. 10 capsule 0   ELIQUIS  5 MG TABS tablet Take 1 tablet (5 mg total) by mouth 2 (two) times daily. 180 tablet 1   furosemide  (LASIX ) 20 MG tablet Take 1 tablet (20 mg total) by mouth as needed.     loperamide  (IMODIUM  A-D) 2 MG tablet Take 2 mg by mouth 4 (four) times daily as needed for diarrhea or loose stools.     Multiple Vitamin (MULTIVITAMIN WITH MINERALS) TABS tablet Take 1 tablet by mouth daily.      oxyCODONE -acetaminophen  (PERCOCET) 10-325 MG tablet Take 1 tablet by mouth every 4 (four) hours as needed for pain. 30 tablet 0   sodium chloride  1 g tablet Take 1 tablet (1 g total) by mouth as needed.     Vitamin D , Ergocalciferol , (DRISDOL) 1.25 MG (50000 UNIT) CAPS capsule Take 50,000 Units by mouth  once a week.     methimazole  (TAPAZOLE ) 5 MG tablet Take 0.5 tablets (2.5 mg total) by mouth daily. 45 tablet 3   metoprolol  succinate (TOPROL -XL) 50 MG 24 hr tablet Take 1 tablet (50 mg total) by mouth 2 (two) times daily. Take with or immediately following a meal. 60 tablet 0   No current facility-administered medications for this visit.    PHYSICAL EXAM: Vitals:   11/19/23 1537  BP: 110/60  Pulse: 99  Resp: 20  SpO2: 99%  Weight: 107 lb 12.8 oz (48.9 kg)  Height: 5' 1.5" (1.562 m)   Body mass index is 20.04 kg/m.  Wt Readings from Last 3 Encounters:  11/19/23 107 lb 12.8 oz (48.9 kg)  10/06/23 94 lb 12.8 oz (43 kg)  08/28/23 92 lb (41.7 kg)    General: Well developed, well nourished female in no apparent distress.  HEENT: AT/[place], no external lesions. Hearing intact to the spoken word Eyes: Conjunctiva clear and no icterus. Neck: Trachea midline, neck supple without appreciable thyromegaly or lymphadenopathy and left palpable thyroid  nodule + Abdomen: Soft, non tender, non distended Neurologic: Alert, oriented, normal speech, deep tendon biceps reflexes normal,  no gross focal neurological deficit Extremities: mild pedal pitting edema, no tremors of outstretched hands Skin: Warm, color good.  Psychiatric: Does not appear depressed or anxious  PERTINENT HISTORIC LABORATORY AND IMAGING STUDIES:  All pertinent laboratory results were reviewed. Please see HPI also for further details.   TSH  Date Value Ref Range Status  11/19/2023 8.09 (H) 0.40 - 4.50 mIU/L Final  08/16/2023 2.240 0.350 - 4.500 uIU/mL Final    Comment:    Performed by a 3rd Generation assay with a functional sensitivity of <=0.01 uIU/mL. Performed at [facility], [address], [place], [place] [address]   07/16/2023 2.57 0.40 - 4.50 mIU/L Final   T3, Total  Date Value Ref Range Status  01/18/2022 112 71 - 180 ng/dL Final    Comment:    (  NOTE) Performed At: [facility] [address] [place], [place] [id number] Pearlean Botts MD [id number]   12/20/2018 158 71 - 180 ng/dL Final  78/46/9629 528 71 - 180 ng/dL Final    Lab Results  Component Value Date   FREET4 1.3 11/19/2023   FREET4 1.3 07/16/2023   FREET4 1.00 03/29/2023   T3FREE 3.3 11/19/2023   T3FREE 2.7 07/16/2023   T3FREE 3.6 03/10/2023   TSH 8.09 (H) 11/19/2023   TSH 2.240 08/16/2023   TSH 2.57 07/16/2023    Lab Results  Component Value Date   THYROTRECAB <0.50 12/24/2016    Lab Results  Component Value Date   TSH 8.09 (H) 11/19/2023   TSH 2.240 08/16/2023   TSH 2.57 07/16/2023   FREET4 1.3 11/19/2023   FREET4 1.3 07/16/2023   FREET4 1.00 03/29/2023     No results found for: "TSI"   No components found for: "TRAB"    ASSESSMENT / PLAN  1. Hyperthyroidism   2. Hot thyroid  nodule     -Patient has hyperthyroidism secondary to hyperfunctioning left thyroid  nodule.  She was treated initially with propylthiouracil .  She has been on methimazole  since April 2024.  She is currently taking methimazole  5 mg daily.  She has history of persistent atrial fibrillation as well.  She is unwilling to go for RAI I-131 treatment for hyperthyroidism, because she cannot go for isolation at home.  She is currently on amiodarone  as well.  Will treat hyperthyroidism with antithyroid medication.  She is currently taking methimazole  5 mg daily.  Plan: - Check thyroid  function test today and adjust the dose of methimazole  as needed.  Diagnoses and all orders for this visit:  Hyperthyroidism -     T4, free -     T3, free -     TSH -     methimazole  (TAPAZOLE ) 5 MG tablet; Take 0.5 tablets (2.5 mg total) by mouth daily. -     T3, free -     T4, free -     TSH  Hot thyroid  nodule   Labs reviewed elevated TSH, she is currently taking methimazole  5 mg daily.  I would like to decrease methimazole  to 2.5 mg daily.  Check thyroid  function test TSH, free T4, free T3 in 2 months.    Latest Reference Range & Units 11/19/23 16:23  TSH 0.40 - 4.50 mIU/L 8.09 (H)  Triiodothyronine,Free,Serum 2.3 - 4.2 pg/mL 3.3  T4,Free(Direct) 0.8 - 1.8 ng/dL 1.3  (H): Data is abnormally high   DISPOSITION Follow up in clinic in 4 months suggested.  All questions answered and patient verbalized understanding of the plan.  Rhonda Jolinda Pinkstaff, MD [facility] Endocrinology [facility] [address] [place], [address] [place], [place] [address] Phone # [phone]   At least part of this note was generated using voice recognition software. Inadvertent word errors may have occurred, which were not recognized during the proofreading process.

## 2023-11-20 ENCOUNTER — Encounter: Payer: Self-pay | Admitting: Endocrinology

## 2023-11-20 ENCOUNTER — Telehealth: Payer: Self-pay

## 2023-11-20 LAB — TSH: TSH: 8.09 m[IU]/L — ABNORMAL HIGH (ref 0.40–4.50)

## 2023-11-20 LAB — T4, FREE: Free T4: 1.3 ng/dL (ref 0.8–1.8)

## 2023-11-20 LAB — T3, FREE: T3, Free: 3.3 pg/mL (ref 2.3–4.2)

## 2023-11-20 MED ORDER — METHIMAZOLE 5 MG PO TABS: 2.5000 mg | ORAL_TABLET | Freq: Every day | ORAL | 3 refills | Status: AC

## 2023-11-20 NOTE — Telephone Encounter (Signed)
-----   Message from [place] Thapa sent at 11/20/2023  9:52 AM EDT ----- Please notify patient of Labs reviewed elevated TSH, she is currently taking methimazole  5 mg daily.  I would like to decrease methimazole  to 2.5 mg daily.  Check thyroid  function test TSH, free T4, free T3 in 2 months.  I have placed orders.  Please arrange for lab visit in 2 months.

## 2023-11-20 NOTE — Telephone Encounter (Signed)
 VM left requesting call back to give lab results as directed by MD.

## 2023-11-20 NOTE — Addendum Note (Signed)
 Addended by: Altonio Schwertner, [place] on: 11/20/2023 09:51 AM   Modules accepted: Orders, Level of Service

## 2023-11-23 ENCOUNTER — Telehealth: Payer: Self-pay

## 2023-11-23 NOTE — Telephone Encounter (Signed)
 VM left requesting call back to give lab results as directed by MD.

## 2023-11-23 NOTE — Telephone Encounter (Signed)
-----   Message from [place] Thapa sent at 11/20/2023  9:52 AM EDT ----- Please notify patient of Labs reviewed elevated TSH, she is currently taking methimazole  5 mg daily.  I would like to decrease methimazole  to 2.5 mg daily.  Check thyroid  function test TSH, free T4, free T3 in 2 months.  I have placed orders.  Please arrange for lab visit in 2 months.

## 2023-11-25 ENCOUNTER — Telehealth: Payer: Self-pay

## 2023-11-25 NOTE — Telephone Encounter (Signed)
 Patient attempted to give results multiple times, letter sent

## 2023-12-15 ENCOUNTER — Other Ambulatory Visit: Payer: Self-pay | Admitting: Cardiology

## 2023-12-15 DIAGNOSIS — I4811 Longstanding persistent atrial fibrillation: Secondary | ICD-10-CM

## 2023-12-15 NOTE — Telephone Encounter (Signed)
 Prescription refill request for Eliquis  received. Indication: AF Last office visit: 10/06/23  Porfirio Bristol PA Scr: 1.20 on 09/23/23  Epic Age: 73 Weight: 43kg  Based on above findings Eliquis  5mg  twice daily is the appropriate dose.  Refill approved.

## 2023-12-16 ENCOUNTER — Encounter: Payer: Self-pay | Admitting: Cardiology

## 2023-12-28 ENCOUNTER — Other Ambulatory Visit: Payer: Self-pay

## 2023-12-28 ENCOUNTER — Telehealth: Payer: Self-pay | Admitting: Cardiology

## 2023-12-28 ENCOUNTER — Emergency Department (HOSPITAL_COMMUNITY)

## 2023-12-28 ENCOUNTER — Emergency Department (HOSPITAL_COMMUNITY)
Admission: EM | Admit: 2023-12-28 | Discharge: 2023-12-28 | Disposition: A | Discharge status: Home / Self Care | Source: Home / Self Care | Attending: Emergency Medicine | Admitting: Emergency Medicine

## 2023-12-28 ENCOUNTER — Encounter (HOSPITAL_COMMUNITY): Payer: Self-pay

## 2023-12-28 DIAGNOSIS — M25521 Pain in right elbow: Secondary | ICD-10-CM | POA: Insufficient documentation

## 2023-12-28 DIAGNOSIS — I509 Heart failure, unspecified: Secondary | ICD-10-CM | POA: Insufficient documentation

## 2023-12-28 DIAGNOSIS — E871 Hypo-osmolality and hyponatremia: Secondary | ICD-10-CM | POA: Insufficient documentation

## 2023-12-28 DIAGNOSIS — W19XXXA Unspecified fall, initial encounter: Secondary | ICD-10-CM

## 2023-12-28 DIAGNOSIS — Z91040 Latex allergy status: Secondary | ICD-10-CM | POA: Insufficient documentation

## 2023-12-28 DIAGNOSIS — Y9239 Other specified sports and athletic area as the place of occurrence of the external cause: Secondary | ICD-10-CM | POA: Insufficient documentation

## 2023-12-28 DIAGNOSIS — W01198A Fall on same level from slipping, tripping and stumbling with subsequent striking against other object, initial encounter: Secondary | ICD-10-CM | POA: Insufficient documentation

## 2023-12-28 DIAGNOSIS — R600 Localized edema: Secondary | ICD-10-CM | POA: Insufficient documentation

## 2023-12-28 DIAGNOSIS — M25561 Pain in right knee: Secondary | ICD-10-CM | POA: Insufficient documentation

## 2023-12-28 DIAGNOSIS — S0181XA Laceration without foreign body of other part of head, initial encounter: Secondary | ICD-10-CM | POA: Insufficient documentation

## 2023-12-28 DIAGNOSIS — Z7901 Long term (current) use of anticoagulants: Secondary | ICD-10-CM | POA: Insufficient documentation

## 2023-12-28 DIAGNOSIS — R944 Abnormal results of kidney function studies: Secondary | ICD-10-CM | POA: Insufficient documentation

## 2023-12-28 DIAGNOSIS — A4181 Sepsis due to Enterococcus: Secondary | ICD-10-CM | POA: Diagnosis not present

## 2023-12-28 DIAGNOSIS — N189 Chronic kidney disease, unspecified: Secondary | ICD-10-CM | POA: Insufficient documentation

## 2023-12-28 DIAGNOSIS — A419 Sepsis, unspecified organism: Secondary | ICD-10-CM | POA: Diagnosis not present

## 2023-12-28 LAB — BASIC METABOLIC PANEL WITH GFR
Anion gap: 13 (ref 5–15)
BUN: 27 mg/dL — ABNORMAL HIGH (ref 8–23)
CO2: 21 mmol/L — ABNORMAL LOW (ref 22–32)
Calcium: 8.8 mg/dL — ABNORMAL LOW (ref 8.9–10.3)
Chloride: 96 mmol/L — ABNORMAL LOW (ref 98–111)
Creatinine, Ser: 1.33 mg/dL — ABNORMAL HIGH (ref 0.44–1.00)
GFR, Estimated: 42 mL/min — ABNORMAL LOW (ref >60)
Glucose, Bld: 109 mg/dL — ABNORMAL HIGH (ref 70–99)
Potassium: 4 mmol/L (ref 3.5–5.1)
Sodium: 130 mmol/L — ABNORMAL LOW (ref 135–145)

## 2023-12-28 LAB — CBC
HCT: 39.7 % (ref 36.0–46.0)
Hemoglobin: 12.7 g/dL (ref 12.0–15.0)
MCH: 29.1 pg (ref 26.0–34.0)
MCHC: 32 g/dL (ref 30.0–36.0)
MCV: 91.1 fL (ref 80.0–100.0)
Platelets: 263 10*3/uL (ref 150–400)
RBC: 4.36 MIL/uL (ref 3.87–5.11)
RDW: 18.3 % — ABNORMAL HIGH (ref 11.5–15.5)
WBC: 7.2 10*3/uL (ref 4.0–10.5)
nRBC: 0 % (ref 0.0–0.2)

## 2023-12-28 MED ORDER — SODIUM CHLORIDE 0.9 % IV BOLUS: 500.0000 mL | Freq: Once | INTRAVENOUS | Status: AC

## 2023-12-28 MED ORDER — LIDOCAINE-EPINEPHRINE (PF) 2 %-1:200000 IJ SOLN
20.0000 mL | Freq: Once | INTRAMUSCULAR | Status: AC
  Filled 2023-12-28: qty 20

## 2023-12-28 MED ORDER — BACITRACIN ZINC 500 UNIT/GM EX OINT
TOPICAL_OINTMENT | Freq: Once | CUTANEOUS | Status: AC
  Administered 2023-12-28: 1 via TOPICAL
  Filled 2023-12-28: qty 0.9

## 2023-12-28 MED ORDER — MORPHINE SULFATE (PF) 4 MG/ML IV SOLN
4.0000 mg | Freq: Once | INTRAVENOUS | Status: AC
  Filled 2023-12-28: qty 1

## 2023-12-28 MED ORDER — DOXYCYCLINE HYCLATE 100 MG PO CAPS: 100.0000 mg | ORAL_CAPSULE | Freq: Two times a day (BID) | ORAL | 0 refills | Status: DC

## 2023-12-28 NOTE — ED Provider Notes (Signed)
 [place] EMERGENCY DEPARTMENT AT [facility] Provider Note   CSN: [id number] Arrival date & time: 12/28/23  1157     Patient presents with: Rhonda Marshall   Rhonda Marshall is a 73 y.o. female.   73 year old female presenting after a fall.  Patient lives with a family member who takes care of her, they left around 9 AM and returned around 10:30 AM to fix her breakfast and found the patient down in their home gym, she reports that her cane/rug tripped her up and she fell striking her head on a weight, denies LOC.  She complains of pain to her head, right knee, left elbow, left foot; she has no other complaints at this time. On Eliquis . She is accompanied by a family member who reports she is at her baseline mentally.  She is up-to-date on her tetanus, this was updated 2 years ago prior to her hip surgery. Patient is a poor historian.    Fall       Prior to Admission medications   Medication Sig Start Date End Date Taking? Authorizing Provider  acetaminophen  (TYLENOL ) 500 MG tablet Take 500 mg by mouth as needed for moderate pain.    [provider]  ALPRAZolam  (XANAX ) 1 MG tablet Take 1 mg by mouth 4 (four) times daily as needed for anxiety.    [provider]  amiodarone  (PACERONE ) 200 MG tablet Take 0.5 tablets (100 mg total) by mouth daily. 10/23/23   Eilleen Grates, MD  diclofenac Sodium (VOLTAREN) 1 % GEL Apply 2 g topically 4 (four) times daily as needed.    [provider]  docusate sodium  (COLACE) 100 MG capsule Take 1 capsule (100 mg total) by mouth 2 (two) times daily as needed for mild constipation. 04/02/23   Aura Leeds Latif, DO  ELIQUIS  5 MG TABS tablet Take 1 tablet (5 mg total) by mouth 2 (two) times daily. 12/15/23   Eilleen Grates, MD  furosemide  (LASIX ) 20 MG tablet Take 1 tablet (20 mg total) by mouth as needed. 10/06/23 01/04/24  Meng, Hao, PA  loperamide  (IMODIUM  A-D) 2 MG tablet Take 2 mg by mouth 4 (four) times daily as needed for  diarrhea or loose stools.    [provider]  methimazole  (TAPAZOLE ) 5 MG tablet Take 0.5 tablets (2.5 mg total) by mouth daily. 11/20/23   Thapa, [place], MD  metoprolol  succinate (TOPROL -XL) 50 MG 24 hr tablet Take 1 tablet (50 mg total) by mouth 2 (two) times daily. Take with or immediately following a meal. 08/19/23 09/23/23  Lesa Rape, MD  Multiple Vitamin (MULTIVITAMIN WITH MINERALS) TABS tablet Take 1 tablet by mouth daily.     [provider]  oxyCODONE -acetaminophen  (PERCOCET) 10-325 MG tablet Take 1 tablet by mouth every 4 (four) hours as needed for pain. 01/24/22   Brown, Blaine K, PA-C  sodium chloride  1 g tablet Take 1 tablet (1 g total) by mouth as needed. 10/06/23   Meng, Hao, PA  Vitamin D , Ergocalciferol , (DRISDOL) 1.25 MG (50000 UNIT) CAPS capsule Take 50,000 Units by mouth once a week. 12/28/21   [provider]    Allergies: Plaquenil  [hydroxychloroquine ], Clindamycin/lincomycin, and Latex    Review of Systems  Skin:  Positive for wound.    Updated Vital Signs  Vitals:   12/28/23 1204 12/28/23 1205 12/28/23 1330  BP: 121/74  118/81  Pulse: 88    Resp: 19    Temp: 97.8 F (36.6 C)    TempSrc: Oral  SpO2: 100%    Weight:  49.4 kg   Height:  5' 1.5 (1.562 m)      Physical Exam Vitals and nursing note reviewed.  HENT:     Head:      Comments: ~3cm laceration to left forehead at edge of scalp  Eyes:     Extraocular Movements: Extraocular movements intact.     Pupils: Pupils are equal, round, and reactive to light.    Cardiovascular:     Rate and Rhythm: Normal rate. Rhythm irregular.  Pulmonary:     Effort: Pulmonary effort is normal.  Abdominal:     Palpations: Abdomen is soft.     Tenderness: There is no abdominal tenderness.   Musculoskeletal:     Cervical back: Normal range of motion and neck supple. No tenderness.     Right lower leg: Edema present.     Left lower leg: Edema present.     Comments: Swelling/bruising R knee  with tenderness to palpation, full ROM Pressure ulcer with surrounding erythema to dorsal aspect of R second toe Swelling and tenderness to palpation of left elbow, full ROM    Skin:    General: Skin is warm and dry.     Coloration: Skin is pale.   Neurological:     Mental Status: She is alert.     Comments: Oriented to self/situation/place    (all labs ordered are listed, but only abnormal results are displayed) Labs Reviewed  CBC - Abnormal; Notable for the following components:      Result Value   RDW 18.3 (*)    All other components within normal limits  BASIC METABOLIC PANEL WITH GFR - Abnormal; Notable for the following components:   Sodium 130 (*)    Chloride 96 (*)    CO2 21 (*)    Glucose, Bld 109 (*)    BUN 27 (*)    Creatinine, Ser 1.33 (*)    Calcium 8.8 (*)    GFR, Estimated 42 (*)    All other components within normal limits    EKG: None  Radiology: CT Cervical Spine Wo Contrast Result Date: 12/28/2023 CLINICAL DATA:  Fall, hit head EXAM: CT CERVICAL SPINE WITHOUT CONTRAST TECHNIQUE: Multidetector CT imaging of the cervical spine was performed without intravenous contrast. Multiplanar CT image reconstructions were also generated. RADIATION DOSE REDUCTION: This exam was performed according to the departmental dose-optimization program which includes automated exposure control, adjustment of the mA and/or kV according to patient size and/or use of iterative reconstruction technique. COMPARISON:  None Available. FINDINGS: Alignment: Slight degenerative anterolisthesis at C2-3 through C4-5 due to facet disease. Skull base and vertebrae: No acute fracture. No primary bone lesion or focal pathologic process. Soft tissues and spinal canal: No prevertebral fluid or swelling. No visible canal hematoma. Disc levels: Moderate bilateral degenerative facet disease. Mild degenerative disc disease at C5-6 and C6-7. Upper chest: No acute findings Other: Enlarged multinodular  thyroid . IMPRESSION: Degenerative disc and facet disease.  No acute bony abnormality. Enlarged thyroid  with numerous bilateral nodules. Recommend non emergent thyroid  ultrasound (ref: J Am Coll Radiol. 2015 Feb;12(2): 143-50). Electronically Signed   By: Janeece Mechanic M.D.   On: 12/28/2023 14:07   CT Head Wo Contrast Result Date: 12/28/2023 CLINICAL DATA:  Fall, hit head EXAM: CT HEAD WITHOUT CONTRAST TECHNIQUE: Contiguous axial images were obtained from the base of the skull through the vertex without intravenous contrast. RADIATION DOSE REDUCTION: This exam was performed according to the departmental dose-optimization program  which includes automated exposure control, adjustment of the mA and/or kV according to patient size and/or use of iterative reconstruction technique. COMPARISON:  None Available. FINDINGS: Brain: No acute intracranial abnormality. Specifically, no hemorrhage, hydrocephalus, mass lesion, acute infarction, or significant intracranial injury. Vascular: No hyperdense vessel or unexpected calcification. Skull: No acute calvarial abnormality. Sinuses/Orbits: No acute findings Other: Soft tissue laceration in the left forehead. IMPRESSION: No acute intracranial abnormality. Electronically Signed   By: Janeece Mechanic M.D.   On: 12/28/2023 14:05     .Laceration Repair  Date/Time: 12/28/2023 2:30 PM  Performed by: Kendrick Pax, PA-C Authorized by: Kendrick Pax, PA-C   Consent:    Consent obtained:  Verbal   Consent given by:  Patient   Risks, benefits, and alternatives were discussed: yes     Risks discussed:  Infection, pain, retained foreign body, tendon damage, vascular damage, poor wound healing, poor cosmetic result, need for additional repair and nerve damage   Alternatives discussed:  No treatment Universal protocol:    Procedure explained and questions answered to patient or proxy's satisfaction: yes     Patient identity confirmed:  Verbally with patient Anesthesia:     Anesthesia method:  Local infiltration   Local anesthetic:  Lidocaine  2% WITH epi Laceration details:    Location:  Face   Face location:  Forehead   Length (cm):  3 Pre-procedure details:    Preparation:  Patient was prepped and draped in usual sterile fashion Exploration:    Hemostasis achieved with:  Epinephrine  and direct pressure   Wound exploration: entire depth of wound visualized   Treatment:    Area cleansed with:  Chlorhexidine    Amount of cleaning:  Standard   Irrigation solution:  Sterile saline   Irrigation method:  Syringe Skin repair:    Repair method:  Sutures   Suture size:  5-0   Wound skin closure material used: Vicryl.   Suture technique:  Simple interrupted   Number of sutures:  5 Approximation:    Approximation:  Close Repair type:    Repair type:  Simple Post-procedure details:    Dressing:  Antibiotic ointment and non-adherent dressing   Procedure completion:  Tolerated well, no immediate complications    Medications Ordered in the ED - No data to display                                  Medical Decision Making This patient presents to the ED for concern of fall, this involves an extensive number of treatment options, and is a complaint that carries with it a high risk of complications and morbidity.  The differential diagnosis includes intracranial hemorrhage, fracture, laceration, laceration with foreign body.   Co morbidities that complicate the patient evaluation  CHF, afib On Eliquis , RA, CKD   Additional history obtained:  Additional history obtained from record review and from discussion with family member External records from outside source obtained and reviewed including endocrinology and podiatry notes   Lab Tests:  I Ordered, and personally interpreted labs.  The pertinent results include:  CBC unremarkable, BMP shows mild hyponatremia with sodium of 130, creatinine mildly elevated at 1.33.   Imaging Studies ordered:  I  ordered imaging studies including CT of head and C spine, XR's of L elbow, R knee, left foot, pelvis   I independently visualized and interpreted imaging which showed  - head CT: No acute intracranial abnormality. -  C-spine XR: Degenerative disc and facet disease.  No acute bony abnormality. Enlarged thyroid  with numerous bilateral nodules. Recommend non emergent thyroid  ultrasound (ref: J Am Coll Radiol. 2015 Feb;12(2): 143-50). - XR R knee: - XR L foot: - XR L elbow: - XR pelvis:  XR's pending at time of discharge. I agree with the radiologist interpretation   Cardiac Monitoring: / EKG:  The patient was maintained on a cardiac monitor.  I personally viewed and interpreted the cardiac monitored which showed an underlying rhythm of: afib    Problem List / ED Course / Critical interventions / Medication management  500cc IVF bolus given for mild hyponatremia with slight elevation in creatinine as compared to baseline, likely attributed to dehydration I ordered medication including morphine  for chronic pain  Reevaluation of the patient after these medicines showed that the patient improved I have reviewed the patients home medicines and have made adjustments as needed   Social Determinants of Health:  History of use    Test / Admission - Considered:  Physical exam notable as above.  See above for lab interpretations and CT/x-ray imaging results; CT C-spine is notable for incidental finding of thyroid  nodules, however patient has known diagnosis of this and is followed by endocrinology.  Laceration repaired using absorbable sutures without complication, see above for detailed report.  Tdap is up to date. I discussed the patient's condition in depth with her and her family member, I do believe that patient would benefit from physical therapy in order to help with her strength and mobility in order to avoid future falls.  Patient's family member notes that she is very resistant to  physical therapy as this is something they have discussed in the past, she is not currently working with a physical therapist but they perform exercises together at home to work on her strength.  I recommend close follow-up with patient's primary care provider later this week to discuss this.  Patient handed off to oncoming PA-C Paris Bolds at time of shift change with XR images pending.     Amount and/or Complexity of Data Reviewed Labs: ordered. Radiology: ordered.  Risk OTC drugs. Prescription drug management.        Final diagnoses:  Fall, initial encounter  Facial laceration, initial encounter    ED Discharge Orders     None          Adolm Ahumada 12/28/23 1507    Trish Furl, MD 12/29/23 1032

## 2023-12-28 NOTE — ED Provider Notes (Signed)
 Received patient in signout from previous provider pending x-ray reads.  See her note.  In short, patient presents to our department following a fall for evaluation of right elbow pain, right knee pain, facial laceration, head injury.  She was found facedown in their home gym following tripping over the rug.  Denies LOC.  On Eliquis .  Up-to-date on tetanus.  CT head, cervical spine negative for acute intracranial or cervical injury.  Right knee x-ray shows anterior soft tissue swelling without fracture.  Left elbow x-ray shows concern for occult radial head fracture.  Consulted hand surgery Dr. Primus Brookes who recommended sling, outpatient follow-up.  Patient is not tender to her left elbow or right elbow at all.  Patient's daughter reports some swelling to right elbow but fortunately there is no TTP of bony processes.  I offered to obtain right elbow x-rays however they refused.  Extremities well-perfused with no bony tenderness.  Radial pulse 2+ equally.  Will have patient follow-up with hand surgery if she continues to have pain, swelling.  Patient is to follow-up with primary care provider, PT.  I discussed RICE, symptomatic treatment at home to reduce swelling.  Also provided a hinged knee brace for right knee for stability.  Discussed importance of elevation of joints that are swollen to reduce swelling and pain.    Discussed ED workup, disposition, return to ED precautions with patient who expresses understanding agrees with plan.  All questions answered to their satisfaction.  They are agreeable to plan.  Discharge instructions provided on paperwork.  Patient is to be taken home by daughter who will monitor her overnight.  Provided her with orthopedic surgery and Ortho hand surgery follow-up   Royann Cords, PA 12/28/23 1719    Arvilla Birmingham, MD 12/28/23 1946

## 2023-12-28 NOTE — ED Triage Notes (Signed)
 Patient lost her balance and fell onto her head. She hit a metal weight. 1 inch laceration to left side of her forehead. Takes eloquis. No LOC. Said she has a lot of fluid on her legs making it hard to walk.

## 2023-12-28 NOTE — ED Provider Notes (Incomplete)
 I provided a substantive portion of the care of this patient.  I personally made/approved the management plan for this patient and take responsibility for the patient management. {Remember to document shared critical care using "edcritical" dot phrase:1}

## 2023-12-28 NOTE — Telephone Encounter (Signed)
  Pt's daughter said, the pt fell today and she brought the pt to [facility]. She would like to inform Dr. Lavonne Prairie

## 2023-12-28 NOTE — Telephone Encounter (Signed)
 Dtr calling in, states she took her mother to [facility] due to fall and would like to notify Dr. Lavonne Prairie. Per ED note:     Patient lost her balance and fell onto her head. She hit a metal weight. 1 inch laceration to left side of her forehead. Takes eloquis. No LOC. Said she has a lot of fluid on her legs making it hard to walk.   Forwarded to Dr. Lavonne Prairie.

## 2023-12-28 NOTE — Discharge Instructions (Addendum)
 Follow-up with your primary care provider this week, discuss outpatient physical therapy referral in order to help with your strength and mobility.  Return to the emergency department if your symptoms worsen.  You can continue Tylenol  as needed for pain.

## 2023-12-29 NOTE — Addendum Note (Signed)
 Addended by: Ruweyda Macknight N on: 12/29/2023 05:16 PM   Modules accepted: Orders

## 2023-12-29 NOTE — Telephone Encounter (Signed)
 Spoke with pt's son (per DPR) and relayed Dr. Atlas Lea suggestions for Lasix  20 mg for 3 days. A BMET was ordered and released. Son aware of lab draw in 3 days. Son verbalized understanding. All questions if any were answered.

## 2023-12-31 ENCOUNTER — Other Ambulatory Visit: Payer: Self-pay

## 2023-12-31 ENCOUNTER — Emergency Department (HOSPITAL_COMMUNITY)

## 2023-12-31 ENCOUNTER — Encounter: Payer: Self-pay | Admitting: Cardiology

## 2023-12-31 ENCOUNTER — Inpatient Hospital Stay (HOSPITAL_COMMUNITY)
Admission: EM | Admit: 2023-12-31 | Discharge: 2024-01-08 | DRG: 871 | Disposition: A | Discharge status: Home Health | Attending: Internal Medicine | Admitting: Internal Medicine

## 2023-12-31 ENCOUNTER — Encounter (HOSPITAL_COMMUNITY): Payer: Self-pay

## 2023-12-31 DIAGNOSIS — E876 Hypokalemia: Secondary | ICD-10-CM | POA: Diagnosis not present

## 2023-12-31 DIAGNOSIS — K224 Dyskinesia of esophagus: Secondary | ICD-10-CM | POA: Diagnosis present

## 2023-12-31 DIAGNOSIS — A4181 Sepsis due to Enterococcus: Principal | ICD-10-CM | POA: Diagnosis present

## 2023-12-31 DIAGNOSIS — I5023 Acute on chronic systolic (congestive) heart failure: Secondary | ICD-10-CM | POA: Diagnosis present

## 2023-12-31 DIAGNOSIS — J690 Pneumonitis due to inhalation of food and vomit: Principal | ICD-10-CM | POA: Diagnosis present

## 2023-12-31 DIAGNOSIS — M069 Rheumatoid arthritis, unspecified: Secondary | ICD-10-CM | POA: Diagnosis present

## 2023-12-31 DIAGNOSIS — E0590 Thyrotoxicosis, unspecified without thyrotoxic crisis or storm: Secondary | ICD-10-CM | POA: Diagnosis present

## 2023-12-31 DIAGNOSIS — N183 Chronic kidney disease, stage 3 unspecified: Secondary | ICD-10-CM | POA: Diagnosis not present

## 2023-12-31 DIAGNOSIS — L89890 Pressure ulcer of other site, unstageable: Secondary | ICD-10-CM | POA: Diagnosis present

## 2023-12-31 DIAGNOSIS — Z91040 Latex allergy status: Secondary | ICD-10-CM

## 2023-12-31 DIAGNOSIS — I340 Nonrheumatic mitral (valve) insufficiency: Secondary | ICD-10-CM | POA: Diagnosis present

## 2023-12-31 DIAGNOSIS — N1831 Chronic kidney disease, stage 3a: Secondary | ICD-10-CM | POA: Diagnosis present

## 2023-12-31 DIAGNOSIS — J9601 Acute respiratory failure with hypoxia: Secondary | ICD-10-CM | POA: Diagnosis present

## 2023-12-31 DIAGNOSIS — Z79899 Other long term (current) drug therapy: Secondary | ICD-10-CM

## 2023-12-31 DIAGNOSIS — W010XXA Fall on same level from slipping, tripping and stumbling without subsequent striking against object, initial encounter: Secondary | ICD-10-CM | POA: Diagnosis present

## 2023-12-31 DIAGNOSIS — I959 Hypotension, unspecified: Secondary | ICD-10-CM | POA: Diagnosis present

## 2023-12-31 DIAGNOSIS — S0181XA Laceration without foreign body of other part of head, initial encounter: Secondary | ICD-10-CM | POA: Diagnosis present

## 2023-12-31 DIAGNOSIS — R6521 Severe sepsis with septic shock: Secondary | ICD-10-CM

## 2023-12-31 DIAGNOSIS — Z87891 Personal history of nicotine dependence: Secondary | ICD-10-CM

## 2023-12-31 DIAGNOSIS — J9602 Acute respiratory failure with hypercapnia: Secondary | ICD-10-CM | POA: Diagnosis present

## 2023-12-31 DIAGNOSIS — N179 Acute kidney failure, unspecified: Secondary | ICD-10-CM | POA: Diagnosis present

## 2023-12-31 DIAGNOSIS — I4821 Permanent atrial fibrillation: Secondary | ICD-10-CM | POA: Diagnosis present

## 2023-12-31 DIAGNOSIS — S0990XA Unspecified injury of head, initial encounter: Secondary | ICD-10-CM | POA: Diagnosis present

## 2023-12-31 DIAGNOSIS — E871 Hypo-osmolality and hyponatremia: Secondary | ICD-10-CM | POA: Diagnosis present

## 2023-12-31 DIAGNOSIS — J189 Pneumonia, unspecified organism: Secondary | ICD-10-CM | POA: Diagnosis present

## 2023-12-31 DIAGNOSIS — M25561 Pain in right knee: Secondary | ICD-10-CM | POA: Diagnosis present

## 2023-12-31 DIAGNOSIS — J90 Pleural effusion, not elsewhere classified: Secondary | ICD-10-CM | POA: Diagnosis not present

## 2023-12-31 DIAGNOSIS — B9689 Other specified bacterial agents as the cause of diseases classified elsewhere: Secondary | ICD-10-CM | POA: Diagnosis not present

## 2023-12-31 DIAGNOSIS — I5020 Unspecified systolic (congestive) heart failure: Secondary | ICD-10-CM | POA: Diagnosis not present

## 2023-12-31 DIAGNOSIS — I4891 Unspecified atrial fibrillation: Secondary | ICD-10-CM | POA: Diagnosis not present

## 2023-12-31 DIAGNOSIS — R5383 Other fatigue: Secondary | ICD-10-CM

## 2023-12-31 DIAGNOSIS — A419 Sepsis, unspecified organism: Secondary | ICD-10-CM | POA: Diagnosis present

## 2023-12-31 DIAGNOSIS — M25521 Pain in right elbow: Secondary | ICD-10-CM | POA: Diagnosis present

## 2023-12-31 DIAGNOSIS — I130 Hypertensive heart and chronic kidney disease with heart failure and stage 1 through stage 4 chronic kidney disease, or unspecified chronic kidney disease: Secondary | ICD-10-CM | POA: Diagnosis present

## 2023-12-31 DIAGNOSIS — J180 Bronchopneumonia, unspecified organism: Secondary | ICD-10-CM | POA: Diagnosis not present

## 2023-12-31 DIAGNOSIS — R1310 Dysphagia, unspecified: Secondary | ICD-10-CM

## 2023-12-31 DIAGNOSIS — J9690 Respiratory failure, unspecified, unspecified whether with hypoxia or hypercapnia: Secondary | ICD-10-CM

## 2023-12-31 DIAGNOSIS — Z7901 Long term (current) use of anticoagulants: Secondary | ICD-10-CM

## 2023-12-31 DIAGNOSIS — E872 Acidosis, unspecified: Secondary | ICD-10-CM | POA: Diagnosis present

## 2023-12-31 DIAGNOSIS — I5043 Acute on chronic combined systolic (congestive) and diastolic (congestive) heart failure: Secondary | ICD-10-CM | POA: Diagnosis present

## 2023-12-31 DIAGNOSIS — I5021 Acute systolic (congestive) heart failure: Secondary | ICD-10-CM | POA: Diagnosis not present

## 2023-12-31 DIAGNOSIS — I509 Heart failure, unspecified: Secondary | ICD-10-CM

## 2023-12-31 DIAGNOSIS — R7881 Bacteremia: Secondary | ICD-10-CM | POA: Diagnosis present

## 2023-12-31 DIAGNOSIS — I5022 Chronic systolic (congestive) heart failure: Secondary | ICD-10-CM | POA: Diagnosis not present

## 2023-12-31 DIAGNOSIS — B952 Enterococcus as the cause of diseases classified elsewhere: Secondary | ICD-10-CM | POA: Diagnosis not present

## 2023-12-31 LAB — BLOOD CULTURE ID PANEL (REFLEXED) - BCID2

## 2023-12-31 LAB — BASIC METABOLIC PANEL WITH GFR
Anion gap: 13 (ref 5–15)
Anion gap: 12 (ref 5–15)
Anion gap: 10 (ref 5–15)
Anion gap: 9 (ref 5–15)
BUN: 31 mg/dL — ABNORMAL HIGH (ref 8–23)
BUN: 31 mg/dL — ABNORMAL HIGH (ref 8–23)
BUN: 31 mg/dL — ABNORMAL HIGH (ref 8–23)
BUN: 31 mg/dL — ABNORMAL HIGH (ref 8–23)
CO2: 20 mmol/L — ABNORMAL LOW (ref 22–32)
CO2: 20 mmol/L — ABNORMAL LOW (ref 22–32)
CO2: 25 mmol/L (ref 22–32)
CO2: 26 mmol/L (ref 22–32)
Calcium: 8.7 mg/dL — ABNORMAL LOW (ref 8.9–10.3)
Calcium: 9.4 mg/dL (ref 8.9–10.3)
Calcium: 8.8 mg/dL — ABNORMAL LOW (ref 8.9–10.3)
Calcium: 8.8 mg/dL — ABNORMAL LOW (ref 8.9–10.3)
Chloride: 91 mmol/L — ABNORMAL LOW (ref 98–111)
Chloride: 99 mmol/L (ref 98–111)
Chloride: 95 mmol/L — ABNORMAL LOW (ref 98–111)
Chloride: 96 mmol/L — ABNORMAL LOW (ref 98–111)
Creatinine, Ser: 1.71 mg/dL — ABNORMAL HIGH (ref 0.44–1.00)
Creatinine, Ser: 1.6 mg/dL — ABNORMAL HIGH (ref 0.44–1.00)
Creatinine, Ser: 1.65 mg/dL — ABNORMAL HIGH (ref 0.44–1.00)
Creatinine, Ser: 1.61 mg/dL — ABNORMAL HIGH (ref 0.44–1.00)
GFR, Estimated: 31 mL/min — ABNORMAL LOW (ref >60)
GFR, Estimated: 34 mL/min — ABNORMAL LOW (ref >60)
GFR, Estimated: 32 mL/min — ABNORMAL LOW (ref >60)
GFR, Estimated: 33 mL/min — ABNORMAL LOW (ref >60)
Glucose, Bld: 140 mg/dL — ABNORMAL HIGH (ref 70–99)
Glucose, Bld: 63 mg/dL — ABNORMAL LOW (ref 70–99)
Glucose, Bld: 74 mg/dL (ref 70–99)
Glucose, Bld: 83 mg/dL (ref 70–99)
Potassium: 5.1 mmol/L (ref 3.5–5.1)
Potassium: 5.2 mmol/L — ABNORMAL HIGH (ref 3.5–5.1)
Potassium: 5.2 mmol/L — ABNORMAL HIGH (ref 3.5–5.1)
Potassium: 4.6 mmol/L (ref 3.5–5.1)
Sodium: 124 mmol/L — ABNORMAL LOW (ref 135–145)
Sodium: 131 mmol/L — ABNORMAL LOW (ref 135–145)
Sodium: 130 mmol/L — ABNORMAL LOW (ref 135–145)
Sodium: 131 mmol/L — ABNORMAL LOW (ref 135–145)

## 2023-12-31 LAB — BLOOD GAS, ARTERIAL
Acid-base deficit: 2.4 mmol/L — ABNORMAL HIGH (ref 0.0–2.0)
Bicarbonate: 23.2 mmol/L (ref 20.0–28.0)
Delivery systems: POSITIVE
Drawn by: 732101
Expiratory PAP: 6 cmH2O
FIO2: 60 %
Inspiratory PAP: 12 cmH2O
O2 Saturation: 99.2 %
Patient temperature: 35.3
RATE: 12 {breaths}/min
pCO2 arterial: 39 mmHg (ref 32–48)
pH, Arterial: 7.37 (ref 7.35–7.45)
pO2, Arterial: 168 mmHg — ABNORMAL HIGH (ref 83–108)

## 2023-12-31 LAB — CBC WITH DIFFERENTIAL/PLATELET
Abs Immature Granulocytes: 0.06 10*3/uL (ref 0.00–0.07)
Basophils Absolute: 0.1 10*3/uL (ref 0.0–0.1)
Basophils Relative: 1 %
Eosinophils Absolute: 0.1 10*3/uL (ref 0.0–0.5)
Eosinophils Relative: 2 %
HCT: 44.2 % (ref 36.0–46.0)
Hemoglobin: 13.4 g/dL (ref 12.0–15.0)
Immature Granulocytes: 1 %
Lymphocytes Relative: 20 %
Lymphs Abs: 1.6 10*3/uL (ref 0.7–4.0)
MCH: 28.6 pg (ref 26.0–34.0)
MCHC: 30.3 g/dL (ref 30.0–36.0)
MCV: 94.4 fL (ref 80.0–100.0)
Monocytes Absolute: 0.8 10*3/uL (ref 0.1–1.0)
Monocytes Relative: 9 %
Neutro Abs: 5.4 10*3/uL (ref 1.7–7.7)
Neutrophils Relative %: 67 %
Platelets: 262 10*3/uL (ref 150–400)
RBC: 4.68 MIL/uL (ref 3.87–5.11)
RDW: 18.6 % — ABNORMAL HIGH (ref 11.5–15.5)
WBC: 8 10*3/uL (ref 4.0–10.5)
nRBC: 0 % (ref 0.0–0.2)

## 2023-12-31 LAB — CBC
HCT: 38.8 % (ref 36.0–46.0)
Hemoglobin: 12 g/dL (ref 12.0–15.0)
MCH: 28.5 pg (ref 26.0–34.0)
MCHC: 30.9 g/dL (ref 30.0–36.0)
MCV: 92.2 fL (ref 80.0–100.0)
Platelets: 183 10*3/uL (ref 150–400)
RBC: 4.21 MIL/uL (ref 3.87–5.11)
RDW: 18.5 % — ABNORMAL HIGH (ref 11.5–15.5)
WBC: 11.5 10*3/uL — ABNORMAL HIGH (ref 4.0–10.5)
nRBC: 0 % (ref 0.0–0.2)

## 2023-12-31 LAB — BLOOD GAS, VENOUS
Acid-base deficit: 6.5 mmol/L — ABNORMAL HIGH (ref 0.0–2.0)
Bicarbonate: 22.8 mmol/L (ref 20.0–28.0)
O2 Saturation: 46.2 %
Patient temperature: 37
pCO2, Ven: 61 mmHg — ABNORMAL HIGH (ref 44–60)
pH, Ven: 7.18 — CL (ref 7.25–7.43)
pO2, Ven: 35 mmHg (ref 32–45)

## 2023-12-31 LAB — BRAIN NATRIURETIC PEPTIDE: B Natriuretic Peptide: >4500 pg/mL — ABNORMAL HIGH (ref 0.0–100.0)

## 2023-12-31 LAB — CG4 I-STAT (LACTIC ACID): Lactic Acid, Venous: 1.5 mmol/L (ref 0.5–1.9)

## 2023-12-31 LAB — I-STAT CG4 LACTIC ACID, ED
Lactic Acid, Venous: 3.2 mmol/L (ref 0.5–1.9)
Lactic Acid, Venous: 1.9 mmol/L (ref 0.5–1.9)

## 2023-12-31 LAB — MRSA NEXT GEN BY PCR, NASAL: MRSA by PCR Next Gen: NOT DETECTED

## 2023-12-31 MED ORDER — APIXABAN 2.5 MG PO TABS: 5.0000 mg | ORAL_TABLET | Freq: Two times a day (BID) | ORAL | Status: DC

## 2023-12-31 MED ORDER — METOPROLOL SUCCINATE ER 25 MG PO TB24: 100.0000 mg | ORAL_TABLET | Freq: Every day | ORAL | Status: DC

## 2023-12-31 MED ORDER — ACETAMINOPHEN 650 MG RE SUPP
650.0000 mg | Freq: Four times a day (QID) | RECTAL | Status: DC | PRN
  Administered 2023-12-31: 650 mg via RECTAL
  Filled 2023-12-31: qty 1

## 2023-12-31 MED ORDER — ALPRAZOLAM 0.5 MG PO TABS
1.0000 mg | ORAL_TABLET | Freq: Four times a day (QID) | ORAL | Status: DC | PRN
  Administered 2024-01-02 – 2024-01-08 (×9): 1 mg via ORAL
  Filled 2023-12-31 (×10): qty 2

## 2023-12-31 MED ORDER — METHIMAZOLE 2.5 MG HALF TABLET
2.5000 mg | ORAL_TABLET | Freq: Every day | ORAL | Status: DC
  Administered 2024-01-01 – 2024-01-08 (×8): 2.5 mg via ORAL
  Filled 2023-12-31 (×8): qty 1

## 2023-12-31 MED ORDER — METOPROLOL TARTRATE 5 MG/5ML IV SOLN
5.0000 mg | Freq: Four times a day (QID) | INTRAVENOUS | Status: DC
  Administered 2023-12-31 – 2024-01-05 (×16): 5 mg via INTRAVENOUS
  Filled 2023-12-31 (×17): qty 5

## 2023-12-31 MED ORDER — SODIUM CHLORIDE 1 G PO TABS: 1.0000 g | ORAL_TABLET | Freq: Two times a day (BID) | ORAL | Status: DC

## 2023-12-31 MED ORDER — FUROSEMIDE 10 MG/ML IJ SOLN
20.0000 mg | Freq: Once | INTRAMUSCULAR | Status: AC
  Administered 2023-12-31: 20 mg via INTRAVENOUS
  Filled 2023-12-31: qty 4

## 2023-12-31 MED ORDER — APIXABAN 2.5 MG PO TABS: 2.5000 mg | ORAL_TABLET | Freq: Two times a day (BID) | ORAL | Status: DC

## 2023-12-31 MED ORDER — AMIODARONE HCL 100 MG PO TABS
100.0000 mg | ORAL_TABLET | Freq: Every day | ORAL | Status: DC
  Administered 2024-01-01 – 2024-01-08 (×8): 100 mg via ORAL
  Filled 2023-12-31 (×8): qty 1

## 2023-12-31 MED ORDER — ONDANSETRON HCL 4 MG/2ML IJ SOLN: 4.0000 mg | Freq: Four times a day (QID) | INTRAMUSCULAR | Status: DC | PRN

## 2023-12-31 MED ORDER — ALBUTEROL SULFATE (2.5 MG/3ML) 0.083% IN NEBU: 2.5000 mg | INHALATION_SOLUTION | RESPIRATORY_TRACT | Status: DC | PRN

## 2023-12-31 MED ORDER — ONDANSETRON HCL 4 MG PO TABS: 4.0000 mg | ORAL_TABLET | Freq: Four times a day (QID) | ORAL | Status: DC | PRN

## 2023-12-31 MED ORDER — CHLORHEXIDINE GLUCONATE CLOTH 2 % EX PADS
6.0000 | MEDICATED_PAD | Freq: Every day | CUTANEOUS | Status: DC
  Administered 2023-12-31 – 2024-01-05 (×6): 6 via TOPICAL

## 2023-12-31 MED ORDER — TRAZODONE HCL 50 MG PO TABS
25.0000 mg | ORAL_TABLET | Freq: Every evening | ORAL | Status: DC | PRN
  Administered 2024-01-02: 25 mg via ORAL
  Filled 2023-12-31: qty 1

## 2023-12-31 MED ORDER — PIPERACILLIN-TAZOBACTAM 3.375 G IVPB
3.3750 g | Freq: Three times a day (TID) | INTRAVENOUS | Status: DC
  Administered 2023-12-31 – 2024-01-04 (×13): 3.375 g via INTRAVENOUS
  Filled 2023-12-31 (×13): qty 50

## 2023-12-31 MED ORDER — PIPERACILLIN-TAZOBACTAM 3.375 G IVPB 30 MIN
3.3750 g | Freq: Once | INTRAVENOUS | Status: AC
  Administered 2023-12-31: 3.375 g via INTRAVENOUS
  Filled 2023-12-31: qty 50

## 2023-12-31 MED ORDER — FUROSEMIDE 10 MG/ML IJ SOLN
20.0000 mg | Freq: Once | INTRAMUSCULAR | Status: AC
  Administered 2023-12-31: 20 mg via INTRAVENOUS
  Filled 2023-12-31: qty 2

## 2023-12-31 MED ORDER — ACETAMINOPHEN 325 MG PO TABS
650.0000 mg | ORAL_TABLET | Freq: Four times a day (QID) | ORAL | Status: DC | PRN
  Administered 2024-01-01 – 2024-01-06 (×9): 650 mg via ORAL
  Filled 2023-12-31 (×10): qty 2

## 2023-12-31 MED ORDER — METOPROLOL SUCCINATE ER 50 MG PO TB24: 50.0000 mg | ORAL_TABLET | Freq: Two times a day (BID) | ORAL | Status: DC

## 2023-12-31 MED ORDER — MEDIHONEY WOUND/BURN DRESSING EX PSTE
1.0000 | PASTE | Freq: Every day | CUTANEOUS | Status: DC
  Administered 2023-12-31 – 2024-01-07 (×8): 1 via TOPICAL
  Filled 2023-12-31: qty 44

## 2023-12-31 NOTE — Progress Notes (Signed)
 PHARMACY - PHYSICIAN COMMUNICATION CRITICAL VALUE ALERT - BLOOD CULTURE IDENTIFICATION (BCID)  Rhonda Marshall is an 73 y.o. female who presented to [facility] on 12/31/2023 with a chief complaint of dysphagia.  Assessment:   Chest Xray and CT suspicious for PNA. 1 blood cx set growing GPC in pairs and chains; nothing detected on BCID.  Name of physician (or Provider) Contacted: Eugena Herter, NP  Current antibiotics: Zosyn  Changes to prescribed antibiotics recommended:  -Continue current antibiotics and monitor cx data  Results for orders placed or performed during the hospital encounter of 12/31/23  Blood Culture ID Panel (Reflexed) (Collected: 12/31/2023  2:00 AM)  Result Value Ref Range   Enterococcus faecalis NOT DETECTED NOT DETECTED   Enterococcus Faecium NOT DETECTED NOT DETECTED   Listeria monocytogenes NOT DETECTED NOT DETECTED   Staphylococcus species NOT DETECTED NOT DETECTED   Staphylococcus aureus (BCID) NOT DETECTED NOT DETECTED   Staphylococcus epidermidis NOT DETECTED NOT DETECTED   Staphylococcus lugdunensis NOT DETECTED NOT DETECTED   Streptococcus species NOT DETECTED NOT DETECTED   Streptococcus agalactiae NOT DETECTED NOT DETECTED   Streptococcus pneumoniae NOT DETECTED NOT DETECTED   Streptococcus pyogenes NOT DETECTED NOT DETECTED   A.calcoaceticus-baumannii NOT DETECTED NOT DETECTED   Bacteroides fragilis NOT DETECTED NOT DETECTED   Enterobacterales NOT DETECTED NOT DETECTED   Enterobacter cloacae complex NOT DETECTED NOT DETECTED   Escherichia coli NOT DETECTED NOT DETECTED   Klebsiella aerogenes NOT DETECTED NOT DETECTED   Klebsiella oxytoca NOT DETECTED NOT DETECTED   Klebsiella pneumoniae NOT DETECTED NOT DETECTED   Proteus species NOT DETECTED NOT DETECTED   Salmonella species NOT DETECTED NOT DETECTED   Serratia marcescens NOT DETECTED NOT DETECTED   Haemophilus influenzae NOT DETECTED NOT DETECTED   Neisseria meningitidis NOT DETECTED NOT DETECTED    Pseudomonas aeruginosa NOT DETECTED NOT DETECTED   Stenotrophomonas maltophilia NOT DETECTED NOT DETECTED   Candida albicans NOT DETECTED NOT DETECTED   Candida auris NOT DETECTED NOT DETECTED   Candida glabrata NOT DETECTED NOT DETECTED   Candida krusei NOT DETECTED NOT DETECTED   Candida parapsilosis NOT DETECTED NOT DETECTED   Candida tropicalis NOT DETECTED NOT DETECTED   Cryptococcus neoformans/gattii NOT DETECTED NOT DETECTED    Arie Kurtz, PharmD 12/31/2023  10:16 PM

## 2023-12-31 NOTE — ED Notes (Signed)
 Patient taken off Barhugger and maintaining temp at this time. Blankets placed on patient.

## 2023-12-31 NOTE — ED Notes (Signed)
 Pt placed on bear hugger.

## 2023-12-31 NOTE — ED Triage Notes (Signed)
 Pt fell Monday face forward and was seen. Pt was discharged and her caregiver noticed a dark bruised under her neck when she got home. Pt has been struggling to swallow and spitting out her food. Pt presents with auditory crackles and blue discoloration in her face.  Hx chf and a-fib

## 2023-12-31 NOTE — Consult Note (Addendum)
 WOC Nurse Consult Note: Reason for Consult: Requested to assess multiple wounds - DTPI on sacrum, Full thickness on left 2nd toe. Wound type: Toe ans sacrum PI. Right elbow due a fall. Pressure Injury POA: YES Measurement: Sacrum: Stage 2 close to the rectum. DTPI at the Lumbar 0.5 x 0.5 cm (bone protrusion) No skin breakdown. Purple skin non bleachable. Periwound: intact Close to rectum redness bleachable. Dressing procedure/placement/frequency: Apply sacrum foam dressing, covering both areas. Change every 3 days or PRN soiling.  Measurement: R elbow: 2 cm x 0.5 cm Wound bed: 100% yellow. Drainage (amount, consistency, odor) Moderate amount, no odor, serous. Periwound: intact Dressing procedure/placement/frequency: Cleanse with Vashe [id number], apply Medihoney on the wound bed, cover with square foam dressing, change daily.  Measurement: 2nd left toe: 0.5 x 0.5 cm Wound bed: 50% yellow. 50% red, dry tissue. Drainage (amount, consistency, odor) scant amount, no odor, serous. Periwound: intact, redness. Dressing procedure/placement/frequency: Cleanse with Vashe [id number], apply Xeroform on the wound bed a single laywer. Cover with gauze or foam dressing, change daily.  WOC team will not plan to follow further.  Please reconsult if further assistance is needed. Thank-you,  Rachel Budds BSN, RN, [employer], WOC  (Pager: [phone])

## 2023-12-31 NOTE — ED Provider Notes (Signed)
 [place] EMERGENCY DEPARTMENT AT [facility] Provider Note   CSN: [id number] Arrival date & time: 12/31/23  0038     Patient presents with: Dysphagia   Rhonda Marshall is a 73 y.o. female.  Patient with past medical history seen for A-fib on Eliquis , RA, chronic systolic CHF, recent fall presents to the emergency department complaining of difficulty swallowing and shortness of breath.  Patient fell on Monday and was evaluated the emergency department.  She was found to have a laceration which was repaired at that time.  Imaging was grossly unremarkable.  Since returning home family has noticed bruising in the submandibular region with some swelling.  They report that the patient was unable to swallow food or medications including furosemide  this evening.  She was also reportedly unable to swallow liquids.  She does appear to be tolerating secretions at the time of arrival.  She then became short of breath and they brought her to the emergency department for further evaluation.   HPI     Prior to Admission medications   Medication Sig Start Date End Date Taking? Authorizing Provider  acetaminophen  (TYLENOL ) 500 MG tablet Take 500 mg by mouth as needed for moderate pain.    [provider]  ALPRAZolam  (XANAX ) 1 MG tablet Take 1 mg by mouth 4 (four) times daily as needed for anxiety.    [provider]  amiodarone  (PACERONE ) 200 MG tablet Take 0.5 tablets (100 mg total) by mouth daily. 10/23/23   Eilleen Grates, MD  diclofenac Sodium (VOLTAREN) 1 % GEL Apply 2 g topically 4 (four) times daily as needed.    [provider]  docusate sodium  (COLACE) 100 MG capsule Take 1 capsule (100 mg total) by mouth 2 (two) times daily as needed for mild constipation. 04/02/23   Sheikh, Omair Latif, DO  doxycycline (VIBRAMYCIN) 100 MG capsule Take 1 capsule (100 mg total) by mouth 2 (two) times daily for 7 days. 12/28/23 01/04/24  Royann Cords, PA  ELIQUIS  5 MG TABS tablet  Take 1 tablet (5 mg total) by mouth 2 (two) times daily. 12/15/23   Eilleen Grates, MD  furosemide  (LASIX ) 20 MG tablet Take 1 tablet (20 mg total) by mouth as needed. 10/06/23 01/04/24  Meng, Hao, PA  loperamide  (IMODIUM  A-D) 2 MG tablet Take 2 mg by mouth 4 (four) times daily as needed for diarrhea or loose stools.    [provider]  methimazole  (TAPAZOLE ) 5 MG tablet Take 0.5 tablets (2.5 mg total) by mouth daily. 11/20/23   Thapa, [place], MD  metoprolol  succinate (TOPROL -XL) 50 MG 24 hr tablet Take 1 tablet (50 mg total) by mouth 2 (two) times daily. Take with or immediately following a meal. 08/19/23 09/23/23  Lesa Rape, MD  Multiple Vitamin (MULTIVITAMIN WITH MINERALS) TABS tablet Take 1 tablet by mouth daily.     [provider]  oxyCODONE -acetaminophen  (PERCOCET) 10-325 MG tablet Take 1 tablet by mouth every 4 (four) hours as needed for pain. 01/24/22   Brown, Blaine K, PA-C  sodium chloride  1 g tablet Take 1 tablet (1 g total) by mouth as needed. 10/06/23   Meng, Hao, PA  Vitamin D , Ergocalciferol , (DRISDOL) 1.25 MG (50000 UNIT) CAPS capsule Take 50,000 Units by mouth once a week. 12/28/21   [provider]    Allergies: Plaquenil  [hydroxychloroquine ], Clindamycin/lincomycin, and Latex    Review of Systems  Updated Vital Signs BP 109/84   Pulse 87   Temp 98.6 F (37 C)  Resp 18   Ht 5' 2 (1.575 m)   Wt 49.4 kg   SpO2 100%   BMI 19.94 kg/m   Physical Exam Vitals and nursing note reviewed.  Constitutional:      General: She is not in acute distress.    Appearance: She is well-developed.  HENT:     Head: Normocephalic and atraumatic.     Mouth/Throat:     Pharynx: Oropharynx is clear.   Eyes:     Conjunctiva/sclera: Conjunctivae normal.   Neck:     Comments: Swelling in the submandibular region, bruising noted. Cardiovascular:     Rate and Rhythm: Tachycardia present. Rhythm irregular.  Pulmonary:     Effort: Pulmonary effort is normal. No  respiratory distress.     Breath sounds: Rales present.  Abdominal:     Palpations: Abdomen is soft.     Tenderness: There is no abdominal tenderness.   Musculoskeletal:        General: No swelling.     Cervical back: Neck supple.   Skin:    General: Skin is warm and dry.     Capillary Refill: Capillary refill takes less than 2 seconds.     Comments: Small ulcer with no drainage noted to second toe of the right foot on the dorsal surface   Neurological:     Mental Status: She is alert.   Psychiatric:        Mood and Affect: Mood normal.     (all labs ordered are listed, but only abnormal results are displayed) Labs Reviewed  BASIC METABOLIC PANEL WITH GFR - Abnormal; Notable for the following components:      Result Value   Sodium 124 (*)    Chloride 91 (*)    CO2 20 (*)    Glucose, Bld 140 (*)    BUN 31 (*)    Creatinine, Ser 1.71 (*)    Calcium 8.7 (*)    GFR, Estimated 31 (*)    All other components within normal limits  BRAIN NATRIURETIC PEPTIDE - Abnormal; Notable for the following components:   B Natriuretic Peptide >4,500.0 (*)    All other components within normal limits  CBC WITH DIFFERENTIAL/PLATELET - Abnormal; Notable for the following components:   RDW 18.6 (*)    All other components within normal limits  BLOOD GAS, VENOUS - Abnormal; Notable for the following components:   pH, Ven 7.18 (*)    pCO2, Ven 61 (*)    Acid-base deficit 6.5 (*)    All other components within normal limits  BLOOD GAS, ARTERIAL - Abnormal; Notable for the following components:   pO2, Arterial 168 (*)    Acid-base deficit 2.4 (*)    All other components within normal limits  I-STAT CG4 LACTIC ACID, ED - Abnormal; Notable for the following components:   Lactic Acid, Venous 3.2 (*)    All other components within normal limits  CULTURE, BLOOD (ROUTINE X 2)  CULTURE, BLOOD (ROUTINE X 2)  I-STAT CG4 LACTIC ACID, ED    EKG: EKG Interpretation Date/Time:  Thursday December 31 2023 01:14:57 EDT Ventricular Rate:  100 PR Interval:    QRS Duration:  158 QT Interval:  439 QTC Calculation: 567 R Axis:   225  Text Interpretation: Atrial fibrillation Nonspecific intraventricular conduction delay Anterior infarct, old No significant change was found Confirmed by Earma Gloss [phone]) on 12/31/2023 1:19:38 AM  Radiology: CT CHEST ABDOMEN PELVIS WO CONTRAST Result Date: 12/31/2023 CLINICAL DATA:  73 year old  female with sepsis. Abnormal lung apices on neck CT 0234 hours today. EXAM: CT CHEST, ABDOMEN AND PELVIS WITHOUT CONTRAST TECHNIQUE: Multidetector CT imaging of the chest, abdomen and pelvis was performed following the standard protocol without IV contrast. RADIATION DOSE REDUCTION: This exam was performed according to the departmental dose-optimization program which includes automated exposure control, adjustment of the mA and/or kV according to patient size and/or use of iterative reconstruction technique. COMPARISON:  CT Chest, Abdomen, and Pelvis 03/10/2023. Neck CT earlier today. FINDINGS: CT CHEST FINDINGS Cardiovascular: Cardiomegaly appears progressed since last year. No pericardial effusion. Advanced calcified coronary artery atherosclerosis. Calcified aortic atherosclerosis. Vascular patency is not evaluated in the absence of IV contrast. Mediastinum/Nodes: Bulky chronic thyroid  goiter, noncontrast appearance grossly stable from last year, and previously evaluated on nuclear Medicine thyroid  scan in 2018. Retained food or secretions in the proximal thoracic esophagus, but nondilated esophagus, decompressed distally. No superimposed mediastinal mass or lymphadenopathy. Lungs/Pleura: Moderate layering right pleural effusion with simple fluid density. Small layering left pleural effusion with simple fluid density. Lower lung volumes and atelectatic changes to the trachea and central airways. Widespread bilateral peribronchial patchy and irregular opacity. All lobes  affected. Right greater than left posterior basal segment lower lobe consolidation with air bronchograms. No cavitating lung nodules. Musculoskeletal: Visible osseous structures appear stable from last year. Degenerative changes in the spine. No acute or suspicious osseous lesion. CT ABDOMEN PELVIS FINDINGS Hepatobiliary: Small volume perihepatic ascites with simple fluid density is new from last year. Layering gallbladder sludge or vicarious contrast excretion in the gallbladder lumen. No strong evidence of acute cholecystitis. No discrete liver lesion on this noncontrast exam. Increased liver density which might reflect amiodarone  therapy. Pancreas: Partially atrophied. Spleen: Small volume perisplenic ascites with simple fluid density. No splenomegaly. Adrenals/Urinary Tract: Negative noncontrast kidneys and adrenal glands. Foley catheter within the urinary bladder. Limited bladder detail due to hip arthroplasty streak artifact. Stomach/Bowel: Small volume ascites and generalized mesenteric congestion/edema appearance. Nondilated large and small bowel. Diverticulosis of the sigmoid colon in the pelvis. Diverticulosis at the hepatic flexure. Colon. No discrete bowel inflammation. Appendix not delineated. No pneumoperitoneum. Vascular/Lymphatic: Advanced Aortoiliac calcified atherosclerosis. Vascular patency is not evaluated in the absence of IV contrast. No lymphadenopathy identified. Reproductive: Negative noncontrast appearance. Other: Moderate volume simple fluid density ascites in the pelvis. Musculoskeletal: Intermittent advanced lumbar spine disc and endplate degeneration (with chronic vacuum disc) appears stable from last year. Chronic right hip arthroplasty. No acute or suspicious osseous lesion identified. Small volume of abnormal soft tissue gas in the right flank, subcutaneous and abutting the superficial muscle layer on series 2, image 90. No regional fluid collection or other acute findings.  Generalized abdominal wall, bilateral flank subcutaneous edema/anasarca which is new from last year. IMPRESSION: 1. Diffuse bilateral Bronchopneumonia, bilateral lower lobe consolidation. Moderate Right and small left layering pleural effusions with simple fluid density favoring transudate. 2. Body wall Anasarca. Small volume abdominal and moderate volume simple fluid density Ascites. Superimposed Small volume of nonspecific subcutaneous soft tissue gas along the right posterior flank. But no other regional abnormality. This is nonspecific, has there been recent subcutaneous injection here? 3. Cardiomegaly, appears progressed from last year. No pericardial effusion. Advanced calcified coronary artery and Aortic Atherosclerosis (ICD10-I70.0). 4. No other acute or inflammatory process identified in the noncontrast chest, abdomen, or pelvis. Bulky Chronic thyroid  goiter. Gallbladder sludge or less likely vicarious contrast excretion. Electronically Signed   By: Marlise Simpers M.D.   On: 12/31/2023 06:20   CT Soft  Tissue Neck Wo Contrast Result Date: 12/31/2023 CLINICAL DATA:  Dysphagia, post trauma, submandibular swelling EXAM: CT NECK WITHOUT CONTRAST TECHNIQUE: Multidetector CT imaging of the neck was performed following the standard protocol without intravenous contrast. RADIATION DOSE REDUCTION: This exam was performed according to the departmental dose-optimization program which includes automated exposure control, adjustment of the mA and/or kV according to patient size and/or use of iterative reconstruction technique. COMPARISON:  None Available. FINDINGS: Pharynx and larynx: Motion limited without obvious mass or swelling. Salivary glands: No inflammation, mass, or stone. Thyroid : Enlarged heterogeneous thyroid . Lymph nodes: None enlarged or abnormal density. Vascular: Nondiagnostic evaluation without contrast. Limited intracranial: Negative. Visualized orbits: Negative. Mastoids and visualized paranasal sinuses:  Clear. Skeleton: No acute or aggressive process. Upper chest: Numerous nodular opacities throughout the visualized lung apices, suspicious for pneumonia and potentially aspiration. Large right pleural effusion. Other: Mild diffuse subcutaneous edema/stranding. No discrete fluid collection. Debris within the esophagus. IMPRESSION: 1. Numerous nodular opacities throughout the visualized lung apices, suspicious for pneumonia, aspiration, and/or septic emboli. Large right pleural effusion. Recommend follow-up CT chest to ensure resolution. 2. Debris within the esophagus, which places the patient at risk for aspiration. 3. Mild diffuse subcutaneous edema, possibly anasarca. No discrete fluid collection. 4. Enlarged and heterogeneous thyroid . Recommend thyroid  US  (ref: J Am Coll Radiol. 2015 Feb;12(2): 143-50). Electronically Signed   By: Stevenson Elbe M.D.   On: 12/31/2023 03:31   CT Maxillofacial Wo Contrast Result Date: 12/31/2023 EXAM: CT OF THE FACE WITHOUT CONTRAST 12/31/2023 02:48:52 AM TECHNIQUE: CT of the face was performed without the administration of intravenous contrast. Multiplanar reformatted images are provided for review. Automated exposure control, iterative reconstruction, and/or weight based adjustment of the mA/kV was utilized to reduce the radiation dose to as low as reasonably achievable. COMPARISON: None available. CLINICAL HISTORY: Facial trauma, blunt. t fell Monday face forward and was seen. Pt was discharged and her caregiver noticed a dark bruised under her neck when she got home. Pt has been struggling to swallow and spitting out her food. Pt presents with blue discoloration in her face. FINDINGS: FACIAL BONES: No acute facial fracture. No mandibular dislocation. No suspicious bone lesion. ORBITS: Globes are intact. No acute traumatic injury. No inflammatory change. SINUSES AND MASTOIDS: No acute abnormality. SOFT TISSUES: Soft tissue irregularity/laceration overlying the frontal bone.  IMPRESSION: 1. Soft tissue irregularity/laceration overlying the frontal bone. 2. No maxillofacial fracture. Electronically signed by: Zadie Herter MD 12/31/2023 02:56 AM EDT RP Workstation: [id number]   DG Chest Port 1 View Result Date: 12/31/2023 CLINICAL DATA:  Shortness of breath. EXAM: PORTABLE CHEST 1 VIEW COMPARISON:  August 15, 2023 FINDINGS: The cardiac silhouette is mildly enlarged and unchanged in size. Low lung volumes are noted. Mild areas of atelectasis and/or infiltrate are seen within the bilateral lung bases. No pleural effusion or pneumothorax is identified. Multilevel degenerative changes seen throughout the thoracic spine. IMPRESSION: Stable cardiomegaly and low lung volumes with mild bibasilar atelectasis and/or infiltrate. Electronically Signed   By: Virgle Grime M.D.   On: 12/31/2023 01:13     .Critical Care  Performed by: Elisa Guest, PA-C Authorized by: Elisa Guest, PA-C   Critical care provider statement:    Critical care time (minutes):  80   Critical care time was exclusive of:  Separately billable procedures and treating other patients   Critical care was necessary to treat or prevent imminent or life-threatening deterioration of the following conditions:  Sepsis and cardiac failure (Decompensated heart failure, sepsis, BiPAP)  Critical care was time spent personally by me on the following activities:  Development of treatment plan with patient or surrogate, discussions with consultants, evaluation of patient's response to treatment, examination of patient, ordering and review of laboratory studies, ordering and review of radiographic studies, ordering and performing treatments and interventions, pulse oximetry, re-evaluation of patient's condition and review of old charts   Care discussed with: admitting provider      Medications Ordered in the ED  piperacillin-tazobactam (ZOSYN) IVPB 3.375 g (0 g Intravenous Stopped 12/31/23 0245)  furosemide   (LASIX ) injection 20 mg (20 mg Intravenous Given 12/31/23 0304)                                    Medical Decision Making Amount and/or Complexity of Data Reviewed Labs: ordered. Radiology: ordered.  Risk Prescription drug management.   This patient presents to the ED for concern of respiratory distress and dysphagia, this involves an extensive number of treatment options, and is a complaint that carries with it a high risk of complications and morbidity.  The differential diagnosis includes aspiration, foreign body in esophagus, pneumonia, fluid overload, others   Co morbidities / Chronic conditions that complicate the patient evaluation  CHF   Additional history obtained:  Additional history obtained from EMR External records from outside source obtained and reviewed including podiatry notes   Lab Tests:  I Ordered, and personally interpreted labs.  The pertinent results include: Lactic acid 3.2, BNP greater than 4500, sodium 124 (was 133 days ago), creatinine 1.71 (was 1.33 three days ago).  Repeat lactate 1.9, ABG showing PO2 168, acid base deficit of 2.4   Imaging Studies ordered:  I ordered imaging studies including CT scans of the neck, maxillofacial region, chest abdomen pelvis without contrast, chest x-ray I independently visualized and interpreted imaging which showed  Stable cardiomegaly and low lung volumes with mild bibasilar  atelectasis and/or infiltrate   1. Soft tissue irregularity/laceration overlying the frontal bone.  2. No maxillofacial fracture.   1. Numerous nodular opacities throughout the visualized lung apices,  suspicious for pneumonia, aspiration, and/or septic emboli. Large  right pleural effusion. Recommend follow-up CT chest to ensure  resolution.  2. Debris within the esophagus, which places the patient at risk for  aspiration.  3. Mild diffuse subcutaneous edema, possibly anasarca. No discrete  fluid collection.  4. Enlarged and  heterogeneous thyroid    1. Diffuse bilateral Bronchopneumonia, bilateral lower lobe  consolidation. Moderate Right and small left layering pleural  effusions with simple fluid density favoring transudate.    2. Body wall Anasarca. Small volume abdominal and moderate volume  simple fluid density Ascites.  Superimposed Small volume of nonspecific subcutaneous soft tissue  gas along the right posterior flank. But no other regional  abnormality. This is nonspecific, has there been recent subcutaneous  injection here?    3. Cardiomegaly, appears progressed from last year. No pericardial  effusion. Advanced calcified coronary artery and Aortic  Atherosclerosis (ICD10-I70.0).    4. No other acute or inflammatory process identified in the  noncontrast chest, abdomen, or pelvis.  Bulky Chronic thyroid  goiter.  Gallbladder sludge or less likely vicarious contrast excretion.   I agree with the radiologist interpretation   Cardiac Monitoring: / EKG:  The patient was maintained on a cardiac monitor.  I personally viewed and interpreted the cardiac monitored which showed an underlying rhythm of: Atrial fibrillation   Problem List /  ED Course / Critical interventions / Medication management   I ordered medication including Zosyn, Lasix  Reevaluation of the patient after these medicines showed that the patient stayed the same I have reviewed the patients home medicines and have made adjustments as needed   Consultations Obtained:  I requested consultation with the pulmonologist/critical care,  and discussed lab and imaging findings as well as pertinent plan - they recommend: admission to hospitalist  Requested consultation with the hospitalist. Dr.Thomas requested I repage after results of ABG, consult pending   Social Determinants of Health:  Patient is a former smoker   Test / Admission - Considered:  Patient with respiratory distress, difficulty swallowing.  Concerns for  aspiration pneumonia.  CT scan showing debris within the esophagus placing patient at risk for further aspiration and numerous nodular opacities in the upper lung area.  Patient treated with Zosyn. Code sepsis activated. IV fluids NOT administered due to patient appearing fluid overloaded and no signs of septic shock.  Patient also with signs of fluid overload with crackles upon arrival and significantly elevated BNP.  Treated with 1 dose of Lasix .  I have consulted critical care who recommends hospitalist admission.      Final diagnoses:  Aspiration pneumonia of both lungs, unspecified aspiration pneumonia type, unspecified part of lung (HCC)  Dysphagia, unspecified type  Respiratory failure, unspecified chronicity, unspecified whether with hypoxia or hypercapnia (HCC)  Acute on chronic heart failure, unspecified heart failure type [facility])    ED Discharge Orders     None          Delories Fetter 12/31/23 1610    Earma Gloss, MD 01/01/24 1554

## 2023-12-31 NOTE — Sepsis Progress Note (Addendum)
 Elink monitoring for the code sepsis protocol.   Per Bedside, antibiotics given after blood cultures

## 2023-12-31 NOTE — ED Notes (Signed)
 Respiratory is in room doing ABG at this time.

## 2023-12-31 NOTE — H&P (Addendum)
 History and Physical  Rhonda Marshall ZHY:[id number] DOB: 12/20/1950 DOA: 12/31/2023  PCP: Daved Eriksson, MD   Chief Complaint: Shortness of breath  HPI: Rhonda Marshall is a 73 y.o. female with medical history significant for chronic atrial fibrillation on Eliquis , hypertension, rheumatoid arthritis, hyperthyroidism, heart failure with reduced EF being admitted to the hospital with volume overload due to acute on chronic heart failure, as well as severe sepsis due to multifocal pneumonia.  History is provided by the patient, as well as my review of the medical record, apparently she fell at home on 6/16 and sustained a laceration to her face which was repaired in the ER, other workup including imaging was unremarkable.  She was also noticed to have possible left radial head fracture, for which she has a sling.  Per the medical record, since discharge back home on 6/16, family was concerned about worsening edema and shortness of breath, patient also has had a bit of a cough.  Apparently she has also been having trouble swallowing and spitting on her food, as she developed some bruising on her neck when she got home.  This morning patient is seen and examined in the ER, she is on BiPAP.  She is awake and alert, following commands.  She denies any pain, states that she has been feeling little short of breath and wheezy lately, but denies any recent cough or fever.  Review of Systems: Please see HPI for pertinent positives and negatives. A complete 10 system review of systems are otherwise negative.  Past Medical History:  Diagnosis Date   Anxiety    Atrial fibrillation (HCC)    newly diagnosed 10/2010   Family history of adverse reaction to anesthesia    Sister's BP drops    Hip fracture (HCC) 01/18/2022   Hypertension    Rheumatoid arthritis(714.0)    Subclinical hyperthyroidism    Past Surgical History:  Procedure Laterality Date   Arm surgery     fracture, right arm   CARDIOVERSION  N/A 08/10/2014   Procedure: CARDIOVERSION;  Surgeon: Liza Riggers, MD;  Location: [facility] ENDOSCOPY;  Service: Cardiovascular;  Laterality: N/A;   CARDIOVERSION N/A 01/22/2022   Procedure: CARDIOVERSION;  Surgeon: Sonny Dust, MD;  Location: [facility] ENDOSCOPY;  Service: Cardiovascular;  Laterality: N/A;   CATARACT EXTRACTION     DIRECT LARYNGOSCOPY N/A 09/02/2019   Procedure: DIRECT LARYNGOSCOPY WITH BIOPSY;  Surgeon: Prescott Brodie, MD;  Location: [facility];  Service: ENT;  Laterality: N/A;   HIP ARTHROPLASTY Right 01/19/2022   Procedure: ARTHROPLASTY BIPOLAR HIP (HEMIARTHROPLASTY);  Surgeon: Osa Blase, MD;  Location: [facility] ORS;  Service: Orthopedics;  Laterality: Right;   IR GASTROSTOMY TUBE MOD SED  10/17/2019   IR GASTROSTOMY TUBE REMOVAL  01/03/2020   TEE WITHOUT CARDIOVERSION N/A 01/22/2022   Procedure: TRANSESOPHAGEAL ECHOCARDIOGRAM (TEE);  Surgeon: Sonny Dust, MD;  Location: [facility] ENDOSCOPY;  Service: Cardiovascular;  Laterality: N/A;   TUBAL LIGATION     Social History:  reports that she quit smoking about 4 years ago. Her smoking use included cigarettes. She started smoking about 58 years ago. She has a 13.6 pack-year smoking history. She has been exposed to tobacco smoke. She has never used smokeless tobacco. She reports that she does not currently use alcohol. She reports that she does not use drugs.  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 Authorizing Provider   acetaminophen  (TYLENOL ) 500 MG tablet Take 500 mg by mouth as needed for moderate pain.    [provider]  ALPRAZolam  (XANAX ) 1 MG tablet Take 1 mg by mouth 4 (four) times daily as needed for anxiety.    [provider]  amiodarone  (PACERONE ) 200 MG tablet Take 0.5 tablets (100 mg total) by mouth daily. 10/23/23   Eilleen Grates, MD  diclofenac Sodium (VOLTAREN) 1 % GEL Apply 2 g topically 4 (four) times daily as needed.    [provider]  docusate sodium  (COLACE) 100 MG capsule Take 1 capsule (100 mg total) by mouth 2 (two) times daily as needed for mild constipation. 04/02/23   Sheikh, Omair Latif, DO  doxycycline (VIBRAMYCIN) 100 MG capsule Take 1 capsule (100 mg total) by mouth 2 (two) times daily for 7 days. 12/28/23 01/04/24  Royann Cords, PA  ELIQUIS  5 MG TABS tablet Take 1 tablet (5 mg total) by mouth 2 (two) times daily. 12/15/23   Eilleen Grates, MD  furosemide  (LASIX ) 20 MG tablet Take 1 tablet (20 mg total) by mouth as needed. 10/06/23 01/04/24  Meng, Hao, PA  loperamide  (IMODIUM  A-D) 2 MG tablet Take 2 mg by mouth 4 (four) times daily as needed for diarrhea or loose stools.    [provider]  methimazole  (TAPAZOLE ) 5 MG tablet Take 0.5 tablets (2.5 mg total) by mouth daily. 11/20/23   Thapa, [place], MD  metoprolol  succinate (TOPROL -XL) 50 MG 24 hr tablet Take 1 tablet (50 mg total) by mouth 2 (two) times daily. Take with or immediately following a meal. 08/19/23 09/23/23  Lesa Rape, MD  Multiple Vitamin (MULTIVITAMIN WITH MINERALS) TABS tablet Take 1 tablet by mouth daily.     [provider]  oxyCODONE -acetaminophen  (PERCOCET) 10-325 MG tablet Take 1 tablet by mouth every 4 (four) hours as needed for pain. 01/24/22   Brown, Blaine K, PA-C  sodium chloride  1 g tablet Take 1 tablet (1 g total) by mouth as needed. 10/06/23   Meng, Hao, PA  Vitamin D , Ergocalciferol , (DRISDOL) 1.25 MG (50000 UNIT) CAPS capsule Take 50,000 Units by mouth once a week.  12/28/21   [provider]    Physical Exam: BP 109/84   Pulse 87   Temp 98.6 F (37 C)   Resp 18   Ht 5' 2 (1.575 m)   Wt 49.4 kg   SpO2 100%   BMI 19.94 kg/m  General: Thin, elderly, chronically ill-appearing woman resting comfortably on BiPAP in the ER.  No family at the bedside.  She is awake alert, following commands. Cardiovascular: Irregularly irregular rate controlled, no murmurs or rubs, she has 2+ pitting bilateral lower extremity edema up to the knees Respiratory: There is good equal bilateral air entry on anterior exam, diffuse rhonchi, minimal tachypnea Abdomen: soft, nontender, nondistended, normal bowel tones heard  Skin: dry, no rashes  Musculoskeletal: no joint effusions, normal range  of motion  Psychiatric: appropriate affect, normal speech  Neurologic: extraocular muscles intact, clear speech, moving all extremities with intact sensorium         Labs on Admission:  Basic Metabolic Panel: Recent Labs  Lab 12/28/23 1352 12/31/23 0108  NA 130* 124*  K 4.0 5.1  CL 96* 91*  CO2 21* 20*  GLUCOSE 109* 140*  BUN 27* 31*  CREATININE 1.33* 1.71*  CALCIUM 8.8* 8.7*   Liver Function Tests: No results for input(s): AST, ALT, ALKPHOS, BILITOT, PROT, ALBUMIN in the last 168 hours. No results for input(s): LIPASE, AMYLASE in the last 168 hours. No results for input(s): AMMONIA in the last 168 hours. CBC: Recent Labs  Lab 12/28/23 1352 12/31/23 0108  WBC 7.2 8.0  NEUTROABS  --  5.4  HGB 12.7 13.4  HCT 39.7 44.2  MCV 91.1 94.4  PLT 263 262   Cardiac Enzymes: No results for input(s): CKTOTAL, CKMB, CKMBINDEX, TROPONINI in the last 168 hours. BNP (last 3 results) Recent Labs    08/16/23 0032 08/28/23 1255 12/31/23 0108  BNP >4,500.0* 369.9* >4,500.0*    ProBNP (last 3 results) No results for input(s): PROBNP in the last 8760 hours.  CBG: No results for input(s): GLUCAP in the last 168 hours.  Radiological  Exams on Admission: CT CHEST ABDOMEN PELVIS WO CONTRAST Result Date: 12/31/2023 CLINICAL DATA:  73 year old female with sepsis. Abnormal lung apices on neck CT 0234 hours today. EXAM: CT CHEST, ABDOMEN AND PELVIS WITHOUT CONTRAST TECHNIQUE: Multidetector CT imaging of the chest, abdomen and pelvis was performed following the standard protocol without IV contrast. RADIATION DOSE REDUCTION: This exam was performed according to the departmental dose-optimization program which includes automated exposure control, adjustment of the mA and/or kV according to patient size and/or use of iterative reconstruction technique. COMPARISON:  CT Chest, Abdomen, and Pelvis 03/10/2023. Neck CT earlier today. FINDINGS: CT CHEST FINDINGS Cardiovascular: Cardiomegaly appears progressed since last year. No pericardial effusion. Advanced calcified coronary artery atherosclerosis. Calcified aortic atherosclerosis. Vascular patency is not evaluated in the absence of IV contrast. Mediastinum/Nodes: Bulky chronic thyroid  goiter, noncontrast appearance grossly stable from last year, and previously evaluated on nuclear Medicine thyroid  scan in 2018. Retained food or secretions in the proximal thoracic esophagus, but nondilated esophagus, decompressed distally. No superimposed mediastinal mass or lymphadenopathy. Lungs/Pleura: Moderate layering right pleural effusion with simple fluid density. Small layering left pleural effusion with simple fluid density. Lower lung volumes and atelectatic changes to the trachea and central airways. Widespread bilateral peribronchial patchy and irregular opacity. All lobes affected. Right greater than left posterior basal segment lower lobe consolidation with air bronchograms. No cavitating lung nodules. Musculoskeletal: Visible osseous structures appear stable from last year. Degenerative changes in the spine. No acute or suspicious osseous lesion. CT ABDOMEN PELVIS FINDINGS Hepatobiliary: Small volume  perihepatic ascites with simple fluid density is new from last year. Layering gallbladder sludge or vicarious contrast excretion in the gallbladder lumen. No strong evidence of acute cholecystitis. No discrete liver lesion on this noncontrast exam. Increased liver density which might reflect amiodarone  therapy. Pancreas: Partially atrophied. Spleen: Small volume perisplenic ascites with simple fluid density. No splenomegaly. Adrenals/Urinary Tract: Negative noncontrast kidneys and adrenal glands. Foley catheter within the urinary bladder. Limited bladder detail due to hip arthroplasty streak artifact. Stomach/Bowel: Small volume ascites and generalized mesenteric congestion/edema appearance. Nondilated large and small bowel. Diverticulosis of the sigmoid colon in the pelvis. Diverticulosis at the hepatic flexure. Colon. No discrete bowel inflammation. Appendix not delineated. No  pneumoperitoneum. Vascular/Lymphatic: Advanced Aortoiliac calcified atherosclerosis. Vascular patency is not evaluated in the absence of IV contrast. No lymphadenopathy identified. Reproductive: Negative noncontrast appearance. Other: Moderate volume simple fluid density ascites in the pelvis. Musculoskeletal: Intermittent advanced lumbar spine disc and endplate degeneration (with chronic vacuum disc) appears stable from last year. Chronic right hip arthroplasty. No acute or suspicious osseous lesion identified. Small volume of abnormal soft tissue gas in the right flank, subcutaneous and abutting the superficial muscle layer on series 2, image 90. No regional fluid collection or other acute findings. Generalized abdominal wall, bilateral flank subcutaneous edema/anasarca which is new from last year. IMPRESSION: 1. Diffuse bilateral Bronchopneumonia, bilateral lower lobe consolidation. Moderate Right and small left layering pleural effusions with simple fluid density favoring transudate. 2. Body wall Anasarca. Small volume abdominal and  moderate volume simple fluid density Ascites. Superimposed Small volume of nonspecific subcutaneous soft tissue gas along the right posterior flank. But no other regional abnormality. This is nonspecific, has there been recent subcutaneous injection here? 3. Cardiomegaly, appears progressed from last year. No pericardial effusion. Advanced calcified coronary artery and Aortic Atherosclerosis (ICD10-I70.0). 4. No other acute or inflammatory process identified in the noncontrast chest, abdomen, or pelvis. Bulky Chronic thyroid  goiter. Gallbladder sludge or less likely vicarious contrast excretion. Electronically Signed   By: Marlise Simpers M.D.   On: 12/31/2023 06:20   CT Soft Tissue Neck Wo Contrast Result Date: 12/31/2023 CLINICAL DATA:  Dysphagia, post trauma, submandibular swelling EXAM: CT NECK WITHOUT CONTRAST TECHNIQUE: Multidetector CT imaging of the neck was performed following the standard protocol without intravenous contrast. RADIATION DOSE REDUCTION: This exam was performed according to the departmental dose-optimization program which includes automated exposure control, adjustment of the mA and/or kV according to patient size and/or use of iterative reconstruction technique. COMPARISON:  None Available. FINDINGS: Pharynx and larynx: Motion limited without obvious mass or swelling. Salivary glands: No inflammation, mass, or stone. Thyroid : Enlarged heterogeneous thyroid . Lymph nodes: None enlarged or abnormal density. Vascular: Nondiagnostic evaluation without contrast. Limited intracranial: Negative. Visualized orbits: Negative. Mastoids and visualized paranasal sinuses: Clear. Skeleton: No acute or aggressive process. Upper chest: Numerous nodular opacities throughout the visualized lung apices, suspicious for pneumonia and potentially aspiration. Large right pleural effusion. Other: Mild diffuse subcutaneous edema/stranding. No discrete fluid collection. Debris within the esophagus. IMPRESSION: 1. Numerous  nodular opacities throughout the visualized lung apices, suspicious for pneumonia, aspiration, and/or septic emboli. Large right pleural effusion. Recommend follow-up CT chest to ensure resolution. 2. Debris within the esophagus, which places the patient at risk for aspiration. 3. Mild diffuse subcutaneous edema, possibly anasarca. No discrete fluid collection. 4. Enlarged and heterogeneous thyroid . Recommend thyroid  US  (ref: J Am Coll Radiol. 2015 Feb;12(2): 143-50). Electronically Signed   By: Stevenson Elbe M.D.   On: 12/31/2023 03:31   CT Maxillofacial Wo Contrast Result Date: 12/31/2023 EXAM: CT OF THE FACE WITHOUT CONTRAST 12/31/2023 02:48:52 AM TECHNIQUE: CT of the face was performed without the administration of intravenous contrast. Multiplanar reformatted images are provided for review. Automated exposure control, iterative reconstruction, and/or weight based adjustment of the mA/kV was utilized to reduce the radiation dose to as low as reasonably achievable. COMPARISON: None available. CLINICAL HISTORY: Facial trauma, blunt. t fell Monday face forward and was seen. Pt was discharged and her caregiver noticed a dark bruised under her neck when she got home. Pt has been struggling to swallow and spitting out her food. Pt presents with blue discoloration in her face. FINDINGS: FACIAL BONES: No acute  facial fracture. No mandibular dislocation. No suspicious bone lesion. ORBITS: Globes are intact. No acute traumatic injury. No inflammatory change. SINUSES AND MASTOIDS: No acute abnormality. SOFT TISSUES: Soft tissue irregularity/laceration overlying the frontal bone. IMPRESSION: 1. Soft tissue irregularity/laceration overlying the frontal bone. 2. No maxillofacial fracture. Electronically signed by: Zadie Herter MD 12/31/2023 02:56 AM EDT RP Workstation: [id number]   DG Chest Port 1 View Result Date: 12/31/2023 CLINICAL DATA:  Shortness of breath. EXAM: PORTABLE CHEST 1 VIEW COMPARISON:  August 15, 2023 FINDINGS: The cardiac silhouette is mildly enlarged and unchanged in size. Low lung volumes are noted. Mild areas of atelectasis and/or infiltrate are seen within the bilateral lung bases. No pleural effusion or pneumothorax is identified. Multilevel degenerative changes seen throughout the thoracic spine. IMPRESSION: Stable cardiomegaly and low lung volumes with mild bibasilar atelectasis and/or infiltrate. Electronically Signed   By: Virgle Grime M.D.   On: 12/31/2023 01:13   Assessment/Plan Rhonda Marshall is a 73 y.o. female with medical history significant for chronic atrial fibrillation on Eliquis , hypertension, rheumatoid arthritis, hyperthyroidism, heart failure with reduced EF being admitted to the hospital with volume overload due to acute on chronic heart failure, as well as severe sepsis due to multifocal pneumonia.  Severe sepsis-meeting criteria with hypothermia, tachycardia, initial lactate 3.2 now improved.  Source of sepsis is multifocal/possible aspiration pneumonia. -Inpatient admission to stepdown -Follow-up blood cultures, obtain urinalysis and culture -Empiric IV Zosyn  Acute respiratory failure with hypercarbia-likely due to multifocal pneumonia, as well as associated pleural effusion and volume overload -Continue BiPAP support, wean as tolerated -Treat pneumonia and heart failure  Lactic acidosis-due to severe sepsis, being treated as above  Acute on chronic CHF with reduced EF 30%-followed by [facility], her daily Lasix  was discontinued a few months ago due to hyponatremia and dehydration.  BNP greater than 4500.  She was taking Lasix  daily for the last couple of days due to concern for volume overload, but continued to put on fluid. -Heart healthy diet -Received total 40 mg IV Lasix  this morning  Hyponatremia-has been a chronic problem for the patient, she was previously on salt tablets.  Most recently was able to discontinue this as she was not taking  Lasix  daily.  Currently seems to have a hypervolemic hyponatremia.  Diuresing as above with IV Lasix . -Monitor with every 8 hours BMP -Start salt tablet twice daily and titrate  Acute kidney injury superimposed on CKD stage III-baseline creatinine approximately 1.2, currently renal function is diminished likely due to volume overload in the setting of sepsis and CHF -Avoid nephrotoxins and renally dose medications -Monitor renal function closely in the setting of diuresis and treatment of her sepsis  Bilateral pleural effusion-likely due to volume overload and should improve with diuresis  Chronic atrial fibrillation-continue metoprolol , amiodarone  and Eliquis  anticoagulation  Hyperthyroidism-continue Tapazole   DVT prophylaxis: Continue Eliquis  for A-fib    Code Status: Full Code  Consults called: None  Admission status: The appropriate patient status for this patient is INPATIENT. Inpatient status is judged to be reasonable and necessary in order to provide the required intensity of service to ensure the patient's safety. The patient's presenting symptoms, physical exam findings, and initial radiographic and laboratory data in the context of their chronic comorbidities is felt to place them at high risk for further clinical deterioration. Furthermore, it is not anticipated that the patient will be medically stable for discharge from the hospital within 2 midnights of admission.    I certify that at  the point of admission it is my clinical judgment that the patient will require inpatient hospital care spanning beyond 2 midnights from the point of admission due to high intensity of service, high risk for further deterioration and high frequency of surveillance required  Due to a high probability of clinically significant, life threatening deterioration, the patient required my highest level of preparedness to intervene emergently and I personally spent this critical care time directly and  personally managing the patient. This critical care time included obtaining a history; examining the patient; reviewing vitals; ordering and review of studies; arranging urgent treatment with development of a management plan; evaluation of patient's response to treatment; frequent reassessment; and, discussions with other providers as well as available family.  Total critical care time: Approximately 70 minutes  Issiah Huffaker Rickey Charm MD [facility] Pager [phone]  If 7PM-7AM, please contact night-coverage [email or web address] Password [facility]  12/31/2023, 7:37 AM

## 2023-12-31 NOTE — Progress Notes (Signed)
   12/31/23 0317  BiPAP/CPAP/SIPAP  $ Non-Invasive Ventilator  Non-Invasive Vent Set Up;Non-Invasive Vent Initial  $ Face Mask Large  Yes  BiPAP/CPAP/SIPAP Pt Type Adult  BiPAP/CPAP/SIPAP V60  Mask Type Full face mask  Mask Size Large  Set Rate 12 breaths/min  Respiratory Rate 15 breaths/min  IPAP 12 cmH20  EPAP 6 cmH2O  FiO2 (%) 60 %  Minute Ventilation 6.3  Leak 7  Peak Inspiratory Pressure (PIP) 14  Tidal Volume (Vt) 452  Patient Home Machine No  Patient Home Mask No  Patient Home Tubing No  CPAP/SIPAP surface wiped down Yes  Device Plugged into RED Power Outlet Yes  BiPAP/CPAP /SiPAP Vitals  Pulse Rate 74  Resp 17  SpO2 (!) 88 %  MEWS Score/Color  MEWS Score 0  MEWS Score Color Green   Patient resting comfortable on Bipap, no distress, not alert enough to follow commands but did open her eyes and nodded her head appropriately.

## 2023-12-31 NOTE — ED Notes (Signed)
 Pt fingertips and extremities are cold to touch. Pt was placed on 4L [place] with O2 varying at 78-88%. Pt given warm blankets and O2 and temp will be re-evaluated.

## 2023-12-31 NOTE — Progress Notes (Addendum)
 Received call to admit patient. Reviewed chart. Patient with recent fall and now presenting with dysphagia and dyspnea. Appears volume up, noted to have low ef in past and is significantly above her dry weight of 90-92 pounds per previous notes. ED has started abx and given lasix .  Per ED, patient is protecting airway. Recommend hospitalist admission. Consult intensivist if additional assistance needed.  Recommend repeat blood gas and consider repeat dosing diuretics if no response to initial dose.

## 2023-12-31 NOTE — Progress Notes (Signed)
 Assisted with transporting PT to [facility] ICU from [facility] ED while on BiPAP- uneventful. ICU RT aware PT has arrived.

## 2023-12-31 NOTE — ED Notes (Signed)
 Back from CT scan. Patient placed back on BIPAP. Patient resting and maintaining at this time.

## 2024-01-01 ENCOUNTER — Inpatient Hospital Stay (HOSPITAL_COMMUNITY)

## 2024-01-01 DIAGNOSIS — I340 Nonrheumatic mitral (valve) insufficiency: Secondary | ICD-10-CM

## 2024-01-01 DIAGNOSIS — J90 Pleural effusion, not elsewhere classified: Secondary | ICD-10-CM | POA: Diagnosis present

## 2024-01-01 DIAGNOSIS — I5023 Acute on chronic systolic (congestive) heart failure: Secondary | ICD-10-CM

## 2024-01-01 DIAGNOSIS — E872 Acidosis, unspecified: Secondary | ICD-10-CM

## 2024-01-01 DIAGNOSIS — J180 Bronchopneumonia, unspecified organism: Secondary | ICD-10-CM | POA: Diagnosis not present

## 2024-01-01 DIAGNOSIS — N179 Acute kidney failure, unspecified: Secondary | ICD-10-CM

## 2024-01-01 DIAGNOSIS — J9601 Acute respiratory failure with hypoxia: Secondary | ICD-10-CM

## 2024-01-01 DIAGNOSIS — I5043 Acute on chronic combined systolic (congestive) and diastolic (congestive) heart failure: Secondary | ICD-10-CM | POA: Diagnosis not present

## 2024-01-01 DIAGNOSIS — J189 Pneumonia, unspecified organism: Secondary | ICD-10-CM | POA: Diagnosis not present

## 2024-01-01 DIAGNOSIS — I5021 Acute systolic (congestive) heart failure: Secondary | ICD-10-CM

## 2024-01-01 DIAGNOSIS — R7881 Bacteremia: Secondary | ICD-10-CM | POA: Diagnosis not present

## 2024-01-01 DIAGNOSIS — I959 Hypotension, unspecified: Secondary | ICD-10-CM

## 2024-01-01 DIAGNOSIS — B952 Enterococcus as the cause of diseases classified elsewhere: Secondary | ICD-10-CM | POA: Diagnosis not present

## 2024-01-01 DIAGNOSIS — E871 Hypo-osmolality and hyponatremia: Secondary | ICD-10-CM | POA: Diagnosis not present

## 2024-01-01 DIAGNOSIS — R6521 Severe sepsis with septic shock: Secondary | ICD-10-CM | POA: Diagnosis not present

## 2024-01-01 DIAGNOSIS — I4821 Permanent atrial fibrillation: Secondary | ICD-10-CM

## 2024-01-01 DIAGNOSIS — A419 Sepsis, unspecified organism: Secondary | ICD-10-CM | POA: Diagnosis not present

## 2024-01-01 DIAGNOSIS — R1310 Dysphagia, unspecified: Secondary | ICD-10-CM

## 2024-01-01 DIAGNOSIS — N1831 Chronic kidney disease, stage 3a: Secondary | ICD-10-CM

## 2024-01-01 DIAGNOSIS — J690 Pneumonitis due to inhalation of food and vomit: Secondary | ICD-10-CM | POA: Diagnosis not present

## 2024-01-01 DIAGNOSIS — E0590 Thyrotoxicosis, unspecified without thyrotoxic crisis or storm: Secondary | ICD-10-CM

## 2024-01-01 LAB — COMPREHENSIVE METABOLIC PANEL WITH GFR
ALT: 36 U/L (ref 0–44)
AST: 43 U/L — ABNORMAL HIGH (ref 15–41)
Albumin: 2.4 g/dL — ABNORMAL LOW (ref 3.5–5.0)
Alkaline Phosphatase: 174 U/L — ABNORMAL HIGH (ref 38–126)
Anion gap: 12 (ref 5–15)
BUN: 29 mg/dL — ABNORMAL HIGH (ref 8–23)
CO2: 25 mmol/L (ref 22–32)
Calcium: 9 mg/dL (ref 8.9–10.3)
Chloride: 96 mmol/L — ABNORMAL LOW (ref 98–111)
Creatinine, Ser: 1.45 mg/dL — ABNORMAL HIGH (ref 0.44–1.00)
GFR, Estimated: 38 mL/min — ABNORMAL LOW (ref >60)
Glucose, Bld: 72 mg/dL (ref 70–99)
Potassium: 4.6 mmol/L (ref 3.5–5.1)
Sodium: 133 mmol/L — ABNORMAL LOW (ref 135–145)
Total Bilirubin: 2.1 mg/dL — ABNORMAL HIGH (ref 0.0–1.2)
Total Protein: 6.3 g/dL — ABNORMAL LOW (ref 6.5–8.1)

## 2024-01-01 LAB — CBC WITH DIFFERENTIAL/PLATELET
Abs Immature Granulocytes: 0.06 10*3/uL (ref 0.00–0.07)
Basophils Absolute: 0.1 10*3/uL (ref 0.0–0.1)
Basophils Relative: 0 %
Eosinophils Absolute: 0.1 10*3/uL (ref 0.0–0.5)
Eosinophils Relative: 1 %
HCT: 40.4 % (ref 36.0–46.0)
Hemoglobin: 12.5 g/dL (ref 12.0–15.0)
Immature Granulocytes: 1 %
Lymphocytes Relative: 5 %
Lymphs Abs: 0.5 10*3/uL — ABNORMAL LOW (ref 0.7–4.0)
MCH: 28.2 pg (ref 26.0–34.0)
MCHC: 30.9 g/dL (ref 30.0–36.0)
MCV: 91.2 fL (ref 80.0–100.0)
Monocytes Absolute: 0.4 10*3/uL (ref 0.1–1.0)
Monocytes Relative: 3 %
Neutro Abs: 10.5 10*3/uL — ABNORMAL HIGH (ref 1.7–7.7)
Neutrophils Relative %: 90 %
Platelets: 179 10*3/uL (ref 150–400)
RBC: 4.43 MIL/uL (ref 3.87–5.11)
RDW: 18.6 % — ABNORMAL HIGH (ref 11.5–15.5)
WBC: 11.6 10*3/uL — ABNORMAL HIGH (ref 4.0–10.5)
nRBC: 0 % (ref 0.0–0.2)

## 2024-01-01 LAB — ECHOCARDIOGRAM COMPLETE
AR max vel: 2.03 cm2
AV Area VTI: 1.81 cm2
AV Area mean vel: 1.74 cm2
AV Mean grad: 4 mmHg
AV Peak grad: 7.2 mmHg
Ao pk vel: 1.34 m/s
Area-P 1/2: 5.95 cm2
Calc EF: 33 %
Height: 62 in
MV M vel: 4.82 m/s
MV Peak grad: 92.9 mmHg
S' Lateral: 3.9 cm
Single Plane A2C EF: 36.5 %
Single Plane A4C EF: 29.7 %
Weight: 1744 [oz_av]

## 2024-01-01 LAB — URINALYSIS, COMPLETE (UACMP) WITH MICROSCOPIC
Bacteria, UA: NONE SEEN
Bilirubin Urine: NEGATIVE
Glucose, UA: NEGATIVE mg/dL
Hgb urine dipstick: NEGATIVE
Ketones, ur: NEGATIVE mg/dL
Leukocytes,Ua: NEGATIVE
Nitrite: NEGATIVE
Protein, ur: NEGATIVE mg/dL
Specific Gravity, Urine: 1.01 (ref 1.005–1.030)
pH: 5 (ref 5.0–8.0)

## 2024-01-01 LAB — LACTIC ACID, PLASMA
Lactic Acid, Venous: 1.9 mmol/L (ref 0.5–1.9)
Lactic Acid, Venous: 2.8 mmol/L (ref 0.5–1.9)

## 2024-01-01 LAB — MAGNESIUM: Magnesium: 1.9 mg/dL (ref 1.7–2.4)

## 2024-01-01 LAB — PROCALCITONIN: Procalcitonin: 0.26 ng/mL

## 2024-01-01 MED ORDER — DICLOFENAC SODIUM 1 % EX GEL
2.0000 g | Freq: Four times a day (QID) | CUTANEOUS | Status: DC | PRN
  Administered 2024-01-02 – 2024-01-05 (×3): 2 g via TOPICAL
  Filled 2024-01-01: qty 100

## 2024-01-01 MED ORDER — PERFLUTREN LIPID MICROSPHERE
1.0000 mL | INTRAVENOUS | Status: AC | PRN
  Administered 2024-01-01: 3 mL via INTRAVENOUS

## 2024-01-01 MED ORDER — ALBUMIN HUMAN 25 % IV SOLN
25.0000 g | Freq: Once | INTRAVENOUS | Status: AC
  Administered 2024-01-01: 25 g via INTRAVENOUS
  Filled 2024-01-01: qty 100

## 2024-01-01 MED ORDER — ALBUMIN HUMAN 25 % IV SOLN
25.0000 g | Freq: Once | INTRAVENOUS | Status: AC
  Administered 2024-01-02: 25 g via INTRAVENOUS
  Filled 2024-01-01: qty 100

## 2024-01-01 MED ORDER — ENOXAPARIN SODIUM 60 MG/0.6ML IJ SOSY
50.0000 mg | PREFILLED_SYRINGE | INTRAMUSCULAR | Status: AC
  Administered 2024-01-01 – 2024-01-03 (×3): 50 mg via SUBCUTANEOUS
  Filled 2024-01-01 (×3): qty 0.6

## 2024-01-01 MED ORDER — SODIUM CHLORIDE 0.9 % IV SOLN
250.0000 mL | INTRAVENOUS | Status: AC
  Administered 2024-01-01: 250 mL via INTRAVENOUS

## 2024-01-01 MED ORDER — NOREPINEPHRINE 4 MG/250ML-% IV SOLN
0.0000 ug/min | INTRAVENOUS | Status: DC
  Administered 2024-01-01: 2 ug/min via INTRAVENOUS
  Filled 2024-01-01: qty 250

## 2024-01-01 NOTE — Evaluation (Signed)
 Clinical/Bedside Swallow Evaluation Patient Details  Name: Rhonda Marshall MRN: [id number] Date of Birth: October 09, 1950  Today's Date: 01/01/2024 Time: SLP Start Time (ACUTE ONLY): (P) 0830 SLP Stop Time (ACUTE ONLY): (P) 0904 SLP Time Calculation (min) (ACUTE ONLY): (P) 34 min  Past Medical History:  Past Medical History:  Diagnosis Date   Anxiety    Atrial fibrillation (HCC)    newly diagnosed 10/2010   Family history of adverse reaction to anesthesia    Sister's BP drops    Hip fracture (HCC) 01/18/2022   Hypertension    Rheumatoid arthritis(714.0)    Subclinical hyperthyroidism    Past Surgical History:  Past Surgical History:  Procedure Laterality Date   Arm surgery     fracture, right arm   CARDIOVERSION N/A 08/10/2014   Procedure: CARDIOVERSION;  Surgeon: Liza Riggers, MD;  Location: [facility] ENDOSCOPY;  Service: Cardiovascular;  Laterality: N/A;   CARDIOVERSION N/A 01/22/2022   Procedure: CARDIOVERSION;  Surgeon: Sonny Dust, MD;  Location: [facility] ENDOSCOPY;  Service: Cardiovascular;  Laterality: N/A;   CATARACT EXTRACTION     DIRECT LARYNGOSCOPY N/A 09/02/2019   Procedure: DIRECT LARYNGOSCOPY WITH BIOPSY;  Surgeon: Prescott Brodie, MD;  Location: [facility];  Service: ENT;  Laterality: N/A;   HIP ARTHROPLASTY Right 01/19/2022   Procedure: ARTHROPLASTY BIPOLAR HIP (HEMIARTHROPLASTY);  Surgeon: Osa Blase, MD;  Location: [facility] ORS;  Service: Orthopedics;  Laterality: Right;   IR GASTROSTOMY TUBE MOD SED  10/17/2019   IR GASTROSTOMY TUBE REMOVAL  01/03/2020   TEE WITHOUT CARDIOVERSION N/A 01/22/2022   Procedure: TRANSESOPHAGEAL ECHOCARDIOGRAM (TEE);  Surgeon: Sonny Dust, MD;  Location: [facility] ENDOSCOPY;  Service: Cardiovascular;  Laterality: N/A;   TUBAL LIGATION     HPI:  (P) Pt is a 73 yo female with recent fall on 6/16, now presenting 6/19 with dysphagia and dyspnea. Admitted with volume overload due to chronic heart failure as well as severe  sepsis due to multifocal PNA. Imaging also revealed debris within the esophagus. Pt has been evaluated by SLP in the past (clinical evaluations in 2021, 2023, 2024) with some concern for ability to masticate but without overt concern for pharyngeal dysphagia or aspiration. 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), afib, HTN, RA, hyperthyroidism, HF    Assessment / Plan / Recommendation  Clinical Impression  Pt does not have overt difficulty observed clinically during PO trials, self-feeding purees and thin liquids but deferring graham crackers because she feels like she cannot chew them. No overt s/s of aspiration are observed, although note that even when trying to challenge pt to take larger volumes of thin liquids, she will take small breaks in between sips to naturally slow her pacing. Pt provides some inconsistent history and son at bedside encouraged me to call pt's other son and DIL, with whom she lives. Her DIL, Devra Fontana, describes increased difficulty with swallowing over the last few days but with no overt coughing while swallowing. They say that prior to that, she was swallowing well but needed her food to be very finely chopped since her teeth had been pulled as she does not like to wear her dentures. Since her fall though they describe trouble keeping POs down (sounds like regurgitation). They also say that it sounded like she was swallowing air. Given that these signs were not observed with SLP this morning, and they report no previous concern for aspiration (had PNA one time in the recent past but that it was  a cold that had progressed into PNA), they would like to start a conservative diet. Can start Dys 1 (puree) diet and thin liquids with meds crushed in puree, using aspiration and esophageal precautions. They would like to see if she can keep POs down prior to pursuing other testing. Plan discussed with family on the phone and in the room, nurse, and GI  MD.   SLP Visit Diagnosis: (P) Dysphagia, unspecified (R13.10)    Aspiration Risk       Diet Recommendation (P) Dysphagia 1 (Puree);Thin liquid    Liquid Administration via: (P) Cup;Straw Medication Administration: (P) Crushed with puree Supervision: (P) Full supervision/cueing for compensatory strategies;Patient able to self feed Compensations: (P) Minimize environmental distractions;Slow rate;Small sips/bites;Follow solids with liquid Postural Changes: (P) Seated upright at 90 degrees;Remain upright for at least 30 minutes after po intake    Other  Recommendations Recommended Consults: (P) Consider esophageal assessment Oral Care Recommendations: (P) Oral care BID     Assistance Recommended at Discharge    Functional Status Assessment (P) Patient has had a recent decline in their functional status and demonstrates the ability to make significant improvements in function in a reasonable and predictable amount of time.  Frequency and Duration (P) min 2x/week  (P) 2 weeks       Prognosis Prognosis for improved oropharyngeal function: (P) Good Barriers to Reach Goals: (P) Cognitive deficits      Swallow Study   General HPI: (P) Pt is a 73 yo female with recent fall on 6/16, now presenting 6/19 with dysphagia and dyspnea. Admitted with volume overload due to chronic heart failure as well as severe sepsis due to multifocal PNA. Imaging also revealed debris within the esophagus. Pt has been evaluated by SLP in the past (clinical evaluations in 2021, 2023, 2024) with some concern for ability to masticate but without overt concern for pharyngeal dysphagia or aspiration. 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), afib, HTN, RA, hyperthyroidism, HF Type of Study: (P) Bedside Swallow Evaluation Previous Swallow Assessment: (P) see HPI Diet Prior to this Study: (P) NPO Temperature Spikes Noted: (P) Yes (100.9) Respiratory Status: (P) Nasal  cannula History of Recent Intubation: (P) No Behavior/Cognition: (P) Alert;Cooperative;Pleasant mood;Requires cueing Oral Cavity Assessment: (P) Within Functional Limits Oral Care Completed by SLP: (P) No Oral Cavity - Dentition: (P) Edentulous (sounds like she doesn't always wear them to eat) Vision: (P) Functional for self-feeding Self-Feeding Abilities: (P) Needs assist Patient Positioning: (P) Upright in bed Baseline Vocal Quality: (P) Normal Volitional Swallow: (P) Able to elicit    Oral/Motor/Sensory Function Overall Oral Motor/Sensory Function: (P) Within functional limits   Ice Chips Ice chips: (P) Within functional limits Presentation: (P) Spoon   Thin Liquid Thin Liquid: (P) Within functional limits Presentation: (P) Cup;Self Fed;Straw    Nectar Thick Nectar Thick Liquid: (P) Not tested   Honey Thick Honey Thick Liquid: (P) Not tested   Puree Puree: (P) Within functional limits Presentation: (P) Self Fed;Spoon   Solid     Solid: (P) Not tested      Beth Brooke., M.A. CCC-SLP Acute Rehabilitation Services Office: [phone]  Secure chat preferred  01/01/2024,9:31 AM

## 2024-01-01 NOTE — Progress Notes (Signed)
*  PRELIMINARY RESULTS* Echocardiogram 2D Echocardiogram has been performed.  Rhonda Marshall D Rhonda Marshall 01/01/2024, 2:25 PM

## 2024-01-01 NOTE — TOC Initial Note (Signed)
 Transition of Care [facility]) - Initial/Assessment Note    Patient Details  Name: Rhonda Marshall MRN: [id number] Date of Birth: 21-Dec-1950  [facility] of [facility]) CM/SW Contact:    Tessie Fila, RN Phone Number: 01/01/2024, 1:04 PM  Clinical Narrative:                 Pt is from home with her daughter Markala Sitts [phone]. NCM spoke with pt daughter and she states her mom has a RW, cane, and shower bench in the home. She does not have O2 needs, DME or HH needs at this time. No SDOH risks identified. TOC will follow for any new recommendations.  Expected Discharge Plan: Home/Self Care Barriers to Discharge: Continued Medical Work up   Patient Goals and CMS Choice Patient states their goals for this hospitalization and ongoing recovery are:: To return home CMS Medicare.gov Compare Post Acute Care list provided to:: Other (Comment Required) (NA) Choice offered to / list presented to : NA [place] ownership interest in [facility].provided to:: Parent NA    Expected Discharge Plan and Services In-house Referral: NA Discharge Planning Services: CM Consult Post Acute Care Choice: NA Living arrangements for the past 2 months: Single Family Home                 DME Arranged: N/A DME Agency: NA       HH Arranged: NA HH Agency: NA        Prior Living Arrangements/Services Living arrangements for the past 2 months: Single Family Home Lives with:: Adult Children Patient language and need for interpreter reviewed:: Yes Do you feel safe going back to the place where you live?: Yes      Need for Family Participation in Patient Care: Yes (Comment) Care giver support system in place?: Yes (comment) Current home services: DME (RW, Cane, Tour manager) Criminal Activity/Legal Involvement Pertinent to Current Situation/Hospitalization: No - Comment as needed  Activities of Daily Living      Permission Sought/Granted Permission sought to share information  with : Family Supports Permission granted to share information with : Yes, Verbal Permission Granted  Share Information with NAME: Taniaya Rudder (daughter) [phone]           Emotional Assessment Appearance:: Other (Comment Required (Unable to assess) Attitude/Demeanor/Rapport: Unable to Assess Affect (typically observed): Unable to Assess Orientation: : Oriented to Self, Oriented to Place Alcohol / Substance Use: Not Applicable Psych Involvement: No (comment)  Admission diagnosis:  Sepsis due to pneumonia (HCC) [J18.9, A41.9] Acute on chronic heart failure, unspecified heart failure type (HCC) [I50.9] Respiratory failure, unspecified chronicity, unspecified whether with hypoxia or hypercapnia (HCC) [J96.90] Dysphagia, unspecified type [R13.10] Aspiration pneumonia of both lungs, unspecified aspiration pneumonia type, unspecified part of lung (HCC) [J69.0] Patient Active Problem List   Diagnosis Date Noted   Sepsis due to pneumonia (HCC) 12/31/2023   Pressure injury of skin 08/18/2023   Acute CHF (congestive heart failure) (HCC) 08/16/2023   Acute respiratory failure with hypoxia (HCC) 08/16/2023   CKD (chronic kidney disease) stage 3, GFR 30-59 ml/min (HCC) 08/16/2023   Atrial fibrillation with rapid ventricular response (HCC) 04/01/2023   Elevated LFTs 03/31/2023   Acute on chronic systolic CHF (congestive heart failure) (HCC) 03/29/2023   Hyponatremia 03/11/2023   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   Bilateral shoulder pain 09/10/2022   S/P right hip fracture 08/20/2022   Hypotension 05/25/2022   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   Elevated troponin 01/19/2022  Hypokalemia 01/19/2022   LBBB (left bundle branch block) 01/19/2022   Dehydration 10/10/2019   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 [place] [address] Phone: [phone] Fax: [phone]  [id number] [place], [place] - [address] [facility] [address] [place] [address] Phone: [phone] Fax: [phone]  [facility] - [facility] [address] [place] [address] Phone: [phone] Fax: [phone]     Social Drivers of Health (SDOH) Social History: SDOH Screenings   Food Insecurity: No Food Insecurity (12/31/2023)  Housing: Low Risk  (12/31/2023)  Transportation Needs: No Transportation Needs (12/31/2023)  Utilities: Not At Risk (12/31/2023)  Alcohol Screen: Low Risk  (08/18/2023)  Financial Resource Strain: Low Risk  (08/18/2023)  Social Connections: Moderately Integrated (12/31/2023)  Tobacco Use: Medium Risk (12/31/2023)   SDOH Interventions: Food Insecurity Interventions: Intervention Not Indicated Housing Interventions: Intervention Not Indicated Transportation Interventions: Intervention Not Indicated Utilities Interventions: Intervention Not Indicated Social Connections Interventions: Intervention Not Indicated   Readmission Risk Interventions    01/01/2024    1:00 PM 04/02/2023    3:52 PM 03/30/2023    1:51 PM  Readmission Risk Prevention Plan  Transportation Screening Complete Complete Complete  PCP or Specialist Appt within 5-7 Days  Complete Complete  PCP or Specialist Appt within 3-5 Days Complete    Home Care Screening  Complete Complete  Medication Review (RN CM)  Complete Complete  HRI or Home Care Consult Complete    Social Work Consult for Recovery Care  Planning/Counseling Complete    Palliative Care Screening Not Applicable    Medication Review Oceanographer) Complete

## 2024-01-01 NOTE — Progress Notes (Addendum)
 PROGRESS NOTE    Rhonda Marshall  UJW:[id number] DOB: [birth year] DOA: 12/31/2023 PCP: Daved Eriksson, MD    Chief Complaint  Patient presents with   Dysphagia    Brief Narrative: Patient is a 73 year old female history of chronic A-fib on Eliquis , hypertension, rheumatoid arthritis, hypothyroidism, HFrEF who admitted to the hospital with volume overload secondary to acute on chronic CHF exacerbation, severe sepsis due to multifocal pneumonia and concern for aspiration pneumonia.  Patient placed on IV antibiotics.  Patient received a dose of IV Lasix  however due to hypotension/soft blood pressure diuretics held.  Cardiology consulted.  GI also consulted due to concerns for debris in the esophagus.   Assessment & Plan:   Principal Problem:   Sepsis due to pneumonia [facility]) Active Problems:   Acute on chronic systolic CHF (congestive heart failure) (HCC)   Hyperthyroidism   Hypotension   AKI (acute kidney injury) (HCC)   Hyponatremia   Acute respiratory failure with hypoxia (HCC)   CKD (chronic kidney disease) stage 3, GFR 30-59 ml/min (HCC)   Dysphagia   Aspiration pneumonia of both lungs (HCC)   Bilateral pleural effusion   Lactic acidosis  #1 severe sepsis likely secondary to multifocal pneumonia/aspiration pneumonia with probable septic shock/hypotension - Patient on admission met criteria for sepsis with hypothermia, tachycardia, initial lactate at 3.2 trending down with source of sepsis being multifocal/aspiration pneumonia. - CT neck and CT chest done concerning for debris in the esophagus as well as diffuse bilateral bronchopneumonia with bilateral lower lobe consolidation, moderate right and small left layering pleural effusions with simple fluid density favoring transudate.  Body wall anasarca noted. -.  2/4 blood cultures preliminary with Enterococcus hirae sensitivities pending.  BCID negative. -MRSA PCR negative. - SLP following evaluated patient and patient on  dysphagia 1 diet. - Continue empiric IV Zosyn. - Patient with persistent hypotension, patient in acute CHF exacerbation and as such difficulty diuresing and unable to give fluid boluses. -Repeat lactic acid level and procalcitonin level. - Will start patient on Levophed drip and consult with PCCM for further evaluation and management.  2.  Acute respiratory failure with hypoxia - Mild likely secondary to multifocal pneumonia as well as acute on chronic CHF exacerbation in the setting of associated pleural effusion and volume overload. - Continue treatment for pneumonia with IV antibiotics as well as heart failure. - Limited use of diuretics due to soft/low blood pressure. - BiPAP as needed.  3.  Lactic acidosis -Likely secondary to problem #1. - See problem #1.  4.  Acute on chronic HFrEF -2D echo done this morning with a EF of 30 to 35%, global hypokinesis left ventricle, grade 2 DD, moderately dilated left atrial size, moderately dilated right atrial size, moderate MVR. - Patient noted to have received a dose of Lasix  20 mg IV x 1 on admission with urine output not recorded. - Patient on 4 L nasal cannula. - BP soft/low and as such unable to diurese at this time. - Due to complicated cardiac history, soft/low blood pressure with difficulty diuresing in the setting of acute CHF exacerbation we will consult with cardiology for further evaluation and management.  5.  Chronic hyponatremia - Patient noted to have previously been on salt tablets which was discontinued as patient was not taking diuretics of Lasix  on a daily basis. - Current course of hyponatremia likely secondary to hypervolemic hyponatremia. - Due to soft/low blood pressure unable to place on IV diuretics at this time. - Hyponatremia improved since admission  after receiving a dose of IV Lasix . - Continue to hold salt tablets.  6.  Debris in esophagus/dysphagia -Noted on CT soft tissue neck and CT chest. - Patient  presented with severe sepsis with concerns for bilateral pneumonia/aspiration pneumonia. - Concern as to whether patient may actually be able to tolerate oral pills and whether they are being absorbed due to debris noted in the esophagus. - GI consulted Home at this time does not feel patient has esophageal food impaction but likely may have a esophageal dysmotility issue. - SLP following. - Eliquis  discontinued patient placed on Lovenox for now pending possible procedure in house. - GI recommending trial of diet recommended by SLP and if patient tolerates diet over the weekend I recommend an outpatient endoscopy once patient has recovered from pneumonia. - Per GI if patient vomits or unable to tolerate diet then will need to consider inpatient endoscopy at this time. - GI following and appreciate their input and recommendations.  7.  Chronic A-fib -Currently rate controlled on IV Lopressor . - Continue amiodarone . - Eliquis  held and patient placed on full dose Lovenox for now due to problem #6 and concern for possible in-house procedure.  8.  Hyperthyroidism -Continue home regimen Tapazole . - Continue IV Lopressor . - Outpatient follow-up with primary endocrinologist.  9.  AKI on CKD stage IIIa - Baseline creatinine approximately 1.2. - Likely secondary to acute CHF exacerbation in the setting of low blood pressure. - Patient received a dose of IV Lasix  with some improvement in renal function. - Monitor with diuresis. - Avoid nephrotoxins.  10.  Bilateral pleural effusion -Likely secondary to volume overload. - IV diuresis.  11.  Hypotension/soft blood pressure -Patient with soft blood pressure/hypotension. - Likely secondary to problem #1. - Difficulty with diuresing due to soft blood pressure/hypotension. - Give a dose of IV albumin x 1. -Check a AM cortisol level, lactic acid, procalcitonin. -May need to be placed on pressors. -See problem #1.      DVT prophylaxis:  Lovenox Code Status: Full Family Communication: Updated patient and son at bedside. Disposition: TBD  Status is: Inpatient Remains inpatient appropriate because: Severity of illness   Consultants:  Cardiology Gastroenterology: Dr. Kimble Pennant 01/01/2024  Procedures:  CT soft tissue neck 12/31/2023 CT maxillofacial 12/31/2023 Chest x-ray 12/31/2023 2D echo 01/01/2024 CT chest abdomen and pelvis 12/31/2023   Antimicrobials:  Anti-infectives (From admission, onward)    Start     Dose/Rate Route Frequency Ordered Stop   12/31/23 0800  piperacillin-tazobactam (ZOSYN) IVPB 3.375 g        3.375 g 12.5 mL/hr over 240 Minutes Intravenous Every 8 hours 12/31/23 0738     12/31/23 0145  piperacillin-tazobactam (ZOSYN) IVPB 3.375 g        3.375 g 100 mL/hr over 30 Minutes Intravenous  Once 12/31/23 0143 12/31/23 0245         Subjective: Patient lying in bed on 4 L nasal cannula.  Eldest son at bedside.  Patient denies any chest pain or significant shortness of breath.  Denies any nausea or vomiting.  Stating she is hungry.  Noted to have just been seen by gastroenterology.  Objective: Vitals:   01/01/24 1226 01/01/24 1300 01/01/24 1316 01/01/24 1400  BP: (!) 73/53  (!) 96/50 (!) 91/55  Pulse: 84 86 91 88  Resp: (!) 25 13 20  (!) 21  Temp: 99.9 F (37.7 C) 99.7 F (37.6 C) 99.7 F (37.6 C) 99.5 F (37.5 C)  TempSrc:      SpO2:  100% 100% 98% 99%  Weight:      Height:        Intake/Output Summary (Last 24 hours) at 01/01/2024 1557 Last data filed at 01/01/2024 1259 Gross per 24 hour  Intake 149.99 ml  Output --  Net 149.99 ml   Filed Weights   12/31/23 0426  Weight: 49.4 kg    Examination:  General exam: Appears calm and comfortable  Respiratory system: Bibasilar/diffuse crackles.  Some coarse breath sounds.  No wheezing.  Fair air movement.  On 4 L nasal cannula.  Cardiovascular system: Irregularly irregular. No JVD, murmurs, rubs, gallops or clicks.  Trace bilateral lower  extremity edema.  Gastrointestinal system: Abdomen is nondistended, soft and nontender. No organomegaly or masses felt. Normal bowel sounds heard. Central nervous system: Alert and oriented. No focal neurological deficits. Extremities: Symmetric 5 x 5 power. Skin: No rashes, lesions or ulcers Psychiatry: Judgement and insight appear normal. Mood & affect appropriate.     Data Reviewed: I have personally reviewed following labs and imaging studies  CBC: Recent Labs  Lab 12/28/23 1352 12/31/23 0108 12/31/23 2309 01/01/24 0828  WBC 7.2 8.0 11.5* 11.6*  NEUTROABS  --  5.4  --  10.5*  HGB 12.7 13.4 12.0 12.5  HCT 39.7 44.2 38.8 40.4  MCV 91.1 94.4 92.2 91.2  PLT 263 262 183 179    Basic Metabolic Panel: Recent Labs  Lab 12/31/23 0108 12/31/23 1009 12/31/23 1552 12/31/23 2309 01/01/24 0828  NA 124* 131* 130* 131* 133*  K 5.1 5.2* 5.2* 4.6 4.6  CL 91* 99 95* 96* 96*  CO2 20* 20* 25 26 25   GLUCOSE 140* 63* 74 83 72  BUN 31* 31* 31* 31* 29*  CREATININE 1.71* 1.60* 1.65* 1.61* 1.45*  CALCIUM 8.7* 9.4 8.8* 8.8* 9.0  MG  --   --   --   --  1.9    GFR: Estimated Creatinine Clearance: 26.5 mL/min (A) (by C-G formula based on SCr of 1.45 mg/dL (H)).  Liver Function Tests: Recent Labs  Lab 01/01/24 0828  AST 43*  ALT 36  ALKPHOS 174*  BILITOT 2.1*  PROT 6.3*  ALBUMIN 2.4*    CBG: No results for input(s): GLUCAP in the last 168 hours.   Recent Results (from the past 240 hours)  Blood culture (routine x 2)     Status: Abnormal (Preliminary result)   Collection Time: 12/31/23  2:00 AM   Specimen: BLOOD  Result Value Ref Range Status   Specimen Description   Final    BLOOD LEFT ANTECUBITAL Performed at [facility], [address] [place], [place] [address]    Special Requests   Final    BOTTLES DRAWN AEROBIC AND ANAEROBIC Blood Culture results may not be optimal due to an inadequate volume of blood received in culture bottles Performed at  [facility], [address], [place], [place] [address]    Culture  Setup Time   Final    GRAM POSITIVE COCCI IN PAIRS IN CHAINS IN BOTH AEROBIC AND ANAEROBIC BOTTLES CRITICAL RESULT CALLED TO, READ BACK BY AND VERIFIED WITH: Floyce Hutching Bailey Lesser [id number] @ 2212 FH    Culture (A)  Final    ENTEROCOCCUS [name] SUSCEPTIBILITIES TO FOLLOW Performed at [facility], [address], [place], [place] [address]    Report Status PENDING  Incomplete  Blood Culture ID Panel (Reflexed)     Status: None   Collection Time: 12/31/23  2:00 AM  Result Value Ref Range Status   Enterococcus faecalis NOT DETECTED NOT DETECTED Final   Enterococcus Faecium NOT DETECTED NOT DETECTED Final   Listeria monocytogenes NOT DETECTED NOT DETECTED Final   Staphylococcus species NOT DETECTED NOT DETECTED Final   Staphylococcus aureus (BCID) NOT DETECTED NOT DETECTED Final   Staphylococcus epidermidis NOT DETECTED NOT DETECTED Final   Staphylococcus lugdunensis NOT DETECTED NOT DETECTED Final   Streptococcus species NOT DETECTED NOT DETECTED Final   Streptococcus agalactiae NOT DETECTED NOT DETECTED Final   Streptococcus pneumoniae NOT DETECTED NOT DETECTED Final   Streptococcus pyogenes NOT DETECTED NOT DETECTED Final   A.calcoaceticus-baumannii NOT DETECTED NOT DETECTED Final   Bacteroides fragilis NOT DETECTED NOT DETECTED Final   Enterobacterales NOT DETECTED NOT DETECTED Final   Enterobacter cloacae complex NOT DETECTED NOT DETECTED Final   Escherichia coli NOT DETECTED NOT DETECTED Final   Klebsiella aerogenes NOT DETECTED NOT DETECTED Final   Klebsiella oxytoca NOT DETECTED NOT DETECTED Final   Klebsiella pneumoniae NOT DETECTED NOT DETECTED Final   Proteus species NOT DETECTED NOT DETECTED Final   Salmonella species NOT DETECTED NOT DETECTED Final   Serratia marcescens NOT DETECTED NOT DETECTED Final   Haemophilus influenzae NOT DETECTED NOT DETECTED Final   Neisseria meningitidis  NOT DETECTED NOT DETECTED Final   Pseudomonas aeruginosa NOT DETECTED NOT DETECTED Final   Stenotrophomonas maltophilia NOT DETECTED NOT DETECTED Final   Candida albicans NOT DETECTED NOT DETECTED Final   Candida auris NOT DETECTED NOT DETECTED Final   Candida glabrata NOT DETECTED NOT DETECTED Final   Candida krusei NOT DETECTED NOT DETECTED Final   Candida parapsilosis NOT DETECTED NOT DETECTED Final   Candida tropicalis NOT DETECTED NOT DETECTED Final   Cryptococcus neoformans/gattii NOT DETECTED NOT DETECTED Final    Comment: Performed at [facility], [address] [place], [place] [address]  Blood culture (routine x 2)     Status: None (Preliminary result)   Collection Time: 12/31/23  2:15 AM   Specimen: BLOOD  Result Value Ref Range Status   Specimen Description   Final    BLOOD RIGHT ANTECUBITAL Performed at [facility], [address], [place], [place] [address]    Special Requests   Final    BOTTLES DRAWN AEROBIC AND ANAEROBIC Blood Culture adequate volume Performed at [facility], [address] [place], [place] [address]    Culture   Final    NO GROWTH 1 DAY Performed at [facility], [address], [place], [place] [address]    Report Status PENDING  Incomplete  MRSA Next Gen by PCR, Nasal     Status: None   Collection Time: 12/31/23  9:14 AM   Specimen: Nasal Mucosa; Nasal Swab  Result Value Ref Range Status   MRSA by PCR Next Gen NOT DETECTED NOT DETECTED Final    Comment: (NOTE) The GeneXpert MRSA Assay (FDA approved for NASAL specimens only), is one component of a comprehensive MRSA colonization surveillance program. It is not intended to diagnose MRSA infection nor to guide or monitor treatment for MRSA infections. Test performance is not FDA approved in patients less than 52 years old. Performed at [facility], [address], [place], [place] [address]          Radiology  Studies: ECHOCARDIOGRAM COMPLETE Result Date: 01/01/2024    ECHOCARDIOGRAM REPORT   Patient Name:   Rhonda Marshall Date of Exam: 01/01/2024 Medical Rec #:  [id number]  Height:       62.0 in Accession #:    [id number]       Weight:       109.0 lb Date of Birth:  1950/09/09        BSA:          1.477 m Patient Age:    73 years         BP:           129/00 mmHg Patient Gender: F                HR:           85 bpm. Exam Location:  Inpatient Procedure: 2D Echo, Color Doppler and Cardiac Doppler (Both Spectral and Color            Flow Doppler were utilized during procedure). Indications:    I50.21 Acute systolic (congestive) heart failure  History:        Patient has prior history of Echocardiogram examinations, most                 recent 03/30/2023.  Sonographer:    Andrena Bang Referring Phys: [phone] Rickayla Wieland V Lizzet Hendley IMPRESSIONS  1. Left ventricular ejection fraction, by estimation, is 30 to 35%. The left ventricle has moderately decreased function. The left ventricle demonstrates global hypokinesis. The left ventricular internal cavity size was moderately dilated. Left ventricular diastolic parameters are consistent with Grade II diastolic dysfunction (pseudonormalization). Elevated left ventricular end-diastolic pressure.  2. Right ventricular systolic function is normal. The right ventricular size is normal.  3. Left atrial size was moderately dilated.  4. Right atrial size was moderately dilated.  5. The mitral valve is abnormal. Moderate mitral valve regurgitation. No evidence of mitral stenosis.  6. Tricuspid valve regurgitation is moderate.  7. The aortic valve is normal in structure. There is moderate calcification of the aortic valve. There is moderate thickening of the aortic valve. Aortic valve regurgitation is not visualized. Aortic valve sclerosis is present, with no evidence of aortic valve stenosis.  8. The inferior vena cava is dilated in size with >50% respiratory variability, suggesting right  atrial pressure of 8 mmHg. FINDINGS  Left Ventricle: Left ventricular ejection fraction, by estimation, is 30 to 35%. The left ventricle has moderately decreased function. The left ventricle demonstrates global hypokinesis. Definity contrast agent was given IV to delineate the left ventricular endocardial borders. Strain was performed and the global longitudinal strain is indeterminate. The left ventricular internal cavity size was moderately dilated. There is no left ventricular hypertrophy. Left ventricular diastolic parameters are consistent with Grade II diastolic dysfunction (pseudonormalization). Elevated left ventricular end-diastolic pressure. Right Ventricle: The right ventricular size is normal. No increase in right ventricular wall thickness. Right ventricular systolic function is normal. Left Atrium: Left atrial size was moderately dilated. Right Atrium: Right atrial size was moderately dilated. Pericardium: There is no evidence of pericardial effusion. Mitral Valve: The mitral valve is abnormal. There is mild thickening of the mitral valve leaflet(s). Moderate mitral valve regurgitation. No evidence of mitral valve stenosis. Tricuspid Valve: The tricuspid valve is normal in structure. Tricuspid valve regurgitation is moderate . No evidence of tricuspid stenosis. Aortic Valve: The aortic valve is normal in structure. There is moderate calcification of the aortic valve. There is moderate thickening of the aortic valve. Aortic valve regurgitation is not visualized. Aortic valve sclerosis is present, with no evidence of aortic valve stenosis. Aortic valve mean gradient measures 4.0 mmHg. Aortic valve peak  gradient measures 7.2 mmHg. Aortic valve area, by VTI measures 1.81 cm. Pulmonic Valve: The pulmonic valve was normal in structure. Pulmonic valve regurgitation is mild. No evidence of pulmonic stenosis. Aorta: The aortic root is normal in size and structure. Venous: The inferior vena cava is dilated in  size with greater than 50% respiratory variability, suggesting right atrial pressure of 8 mmHg. IAS/Shunts: No atrial level shunt detected by color flow Doppler. Additional Comments: 3D was performed not requiring image post processing on an independent workstation and was indeterminate.  LEFT VENTRICLE PLAX 2D LVIDd:         4.50 cm      Diastology LVIDs:         3.90 cm      LV e' lateral:   6.64 cm/s LV PW:         1.10 cm      LV E/e' lateral: 18.7 LV IVS:        0.90 cm LVOT diam:     2.00 cm LV SV:         33 LV SV Index:   23 LVOT Area:     3.14 cm  LV Volumes (MOD) LV vol d, MOD A2C: 157.0 ml LV vol d, MOD A4C: 148.0 ml LV vol s, MOD A2C: 99.7 ml LV vol s, MOD A4C: 104.0 ml LV SV MOD A2C:     57.3 ml LV SV MOD A4C:     148.0 ml LV SV MOD BP:      50.6 ml RIGHT VENTRICLE TAPSE (M-mode): 1.0 cm LEFT ATRIUM             Index LA diam:        4.40 cm 2.98 cm/m LA Vol (A2C):   82.0 ml 55.52 ml/m LA Vol (A4C):   70.2 ml 47.53 ml/m LA Biplane Vol: 76.6 ml 51.86 ml/m  AORTIC VALVE AV Area (Vmax):    2.03 cm AV Area (Vmean):   1.74 cm AV Area (VTI):     1.81 cm AV Vmax:           134.00 cm/s AV Vmean:          89.800 cm/s AV VTI:            0.184 m AV Peak Grad:      7.2 mmHg AV Mean Grad:      4.0 mmHg LVOT Vmax:         86.60 cm/s LVOT Vmean:        49.800 cm/s LVOT VTI:          0.106 m LVOT/AV VTI ratio: 0.58  AORTA Ao Asc diam: 3.50 cm MITRAL VALVE                TRICUSPID VALVE MV Area (PHT): 5.95 cm     TR Peak grad:   36.5 mmHg MR Peak grad: 92.9 mmHg     TR Vmax:        302.00 cm/s MR Mean grad: 56.0 mmHg MR Vmax:      482.00 cm/s   SHUNTS MR Vmean:     349.0 cm/s    Systemic VTI:  0.11 m MV E velocity: 124.00 cm/s  Systemic Diam: 2.00 cm MV A velocity: 39.20 cm/s MV E/A ratio:  3.16 Janelle Mediate MD Electronically signed by Janelle Mediate MD Signature Date/Time: 01/01/2024/2:35:36 PM    Final    CT CHEST ABDOMEN PELVIS WO CONTRAST Result Date: 12/31/2023 CLINICAL DATA:  73 year old  female with  sepsis. Abnormal lung apices on neck CT 0234 hours today. EXAM: CT CHEST, ABDOMEN AND PELVIS WITHOUT CONTRAST TECHNIQUE: Multidetector CT imaging of the chest, abdomen and pelvis was performed following the standard protocol without IV contrast. RADIATION DOSE REDUCTION: This exam was performed according to the departmental dose-optimization program which includes automated exposure control, adjustment of the mA and/or kV according to patient size and/or use of iterative reconstruction technique. COMPARISON:  CT Chest, Abdomen, and Pelvis 03/10/2023. Neck CT earlier today. FINDINGS: CT CHEST FINDINGS Cardiovascular: Cardiomegaly appears progressed since last year. No pericardial effusion. Advanced calcified coronary artery atherosclerosis. Calcified aortic atherosclerosis. Vascular patency is not evaluated in the absence of IV contrast. Mediastinum/Nodes: Bulky chronic thyroid  goiter, noncontrast appearance grossly stable from last year, and previously evaluated on nuclear Medicine thyroid  scan in 2018. Retained food or secretions in the proximal thoracic esophagus, but nondilated esophagus, decompressed distally. No superimposed mediastinal mass or lymphadenopathy. Lungs/Pleura: Moderate layering right pleural effusion with simple fluid density. Small layering left pleural effusion with simple fluid density. Lower lung volumes and atelectatic changes to the trachea and central airways. Widespread bilateral peribronchial patchy and irregular opacity. All lobes affected. Right greater than left posterior basal segment lower lobe consolidation with air bronchograms. No cavitating lung nodules. Musculoskeletal: Visible osseous structures appear stable from last year. Degenerative changes in the spine. No acute or suspicious osseous lesion. CT ABDOMEN PELVIS FINDINGS Hepatobiliary: Small volume perihepatic ascites with simple fluid density is new from last year. Layering gallbladder sludge or vicarious contrast  excretion in the gallbladder lumen. No strong evidence of acute cholecystitis. No discrete liver lesion on this noncontrast exam. Increased liver density which might reflect amiodarone  therapy. Pancreas: Partially atrophied. Spleen: Small volume perisplenic ascites with simple fluid density. No splenomegaly. Adrenals/Urinary Tract: Negative noncontrast kidneys and adrenal glands. Foley catheter within the urinary bladder. Limited bladder detail due to hip arthroplasty streak artifact. Stomach/Bowel: Small volume ascites and generalized mesenteric congestion/edema appearance. Nondilated large and small bowel. Diverticulosis of the sigmoid colon in the pelvis. Diverticulosis at the hepatic flexure. Colon. No discrete bowel inflammation. Appendix not delineated. No pneumoperitoneum. Vascular/Lymphatic: Advanced Aortoiliac calcified atherosclerosis. Vascular patency is not evaluated in the absence of IV contrast. No lymphadenopathy identified. Reproductive: Negative noncontrast appearance. Other: Moderate volume simple fluid density ascites in the pelvis. Musculoskeletal: Intermittent advanced lumbar spine disc and endplate degeneration (with chronic vacuum disc) appears stable from last year. Chronic right hip arthroplasty. No acute or suspicious osseous lesion identified. Small volume of abnormal soft tissue gas in the right flank, subcutaneous and abutting the superficial muscle layer on series 2, image 90. No regional fluid collection or other acute findings. Generalized abdominal wall, bilateral flank subcutaneous edema/anasarca which is new from last year. IMPRESSION: 1. Diffuse bilateral Bronchopneumonia, bilateral lower lobe consolidation. Moderate Right and small left layering pleural effusions with simple fluid density favoring transudate. 2. Body wall Anasarca. Small volume abdominal and moderate volume simple fluid density Ascites. Superimposed Small volume of nonspecific subcutaneous soft tissue gas along  the right posterior flank. But no other regional abnormality. This is nonspecific, has there been recent subcutaneous injection here? 3. Cardiomegaly, appears progressed from last year. No pericardial effusion. Advanced calcified coronary artery and Aortic Atherosclerosis (ICD10-I70.0). 4. No other acute or inflammatory process identified in the noncontrast chest, abdomen, or pelvis. Bulky Chronic thyroid  goiter. Gallbladder sludge or less likely vicarious contrast excretion. Electronically Signed   By: Marlise Simpers M.D.   On: 12/31/2023 06:20   CT  Soft Tissue Neck Wo Contrast Result Date: 12/31/2023 CLINICAL DATA:  Dysphagia, post trauma, submandibular swelling EXAM: CT NECK WITHOUT CONTRAST TECHNIQUE: Multidetector CT imaging of the neck was performed following the standard protocol without intravenous contrast. RADIATION DOSE REDUCTION: This exam was performed according to the departmental dose-optimization program which includes automated exposure control, adjustment of the mA and/or kV according to patient size and/or use of iterative reconstruction technique. COMPARISON:  None Available. FINDINGS: Pharynx and larynx: Motion limited without obvious mass or swelling. Salivary glands: No inflammation, mass, or stone. Thyroid : Enlarged heterogeneous thyroid . Lymph nodes: None enlarged or abnormal density. Vascular: Nondiagnostic evaluation without contrast. Limited intracranial: Negative. Visualized orbits: Negative. Mastoids and visualized paranasal sinuses: Clear. Skeleton: No acute or aggressive process. Upper chest: Numerous nodular opacities throughout the visualized lung apices, suspicious for pneumonia and potentially aspiration. Large right pleural effusion. Other: Mild diffuse subcutaneous edema/stranding. No discrete fluid collection. Debris within the esophagus. IMPRESSION: 1. Numerous nodular opacities throughout the visualized lung apices, suspicious for pneumonia, aspiration, and/or septic emboli.  Large right pleural effusion. Recommend follow-up CT chest to ensure resolution. 2. Debris within the esophagus, which places the patient at risk for aspiration. 3. Mild diffuse subcutaneous edema, possibly anasarca. No discrete fluid collection. 4. Enlarged and heterogeneous thyroid . Recommend thyroid  US  (ref: J Am Coll Radiol. 2015 Feb;12(2): 143-50). Electronically Signed   By: Stevenson Elbe M.D.   On: 12/31/2023 03:31   CT Maxillofacial Wo Contrast Result Date: 12/31/2023 EXAM: CT OF THE FACE WITHOUT CONTRAST 12/31/2023 02:48:52 AM TECHNIQUE: CT of the face was performed without the administration of intravenous contrast. Multiplanar reformatted images are provided for review. Automated exposure control, iterative reconstruction, and/or weight based adjustment of the mA/kV was utilized to reduce the radiation dose to as low as reasonably achievable. COMPARISON: None available. CLINICAL HISTORY: Facial trauma, blunt. t fell Monday face forward and was seen. Pt was discharged and her caregiver noticed a dark bruised under her neck when she got home. Pt has been struggling to swallow and spitting out her food. Pt presents with blue discoloration in her face. FINDINGS: FACIAL BONES: No acute facial fracture. No mandibular dislocation. No suspicious bone lesion. ORBITS: Globes are intact. No acute traumatic injury. No inflammatory change. SINUSES AND MASTOIDS: No acute abnormality. SOFT TISSUES: Soft tissue irregularity/laceration overlying the frontal bone. IMPRESSION: 1. Soft tissue irregularity/laceration overlying the frontal bone. 2. No maxillofacial fracture. Electronically signed by: Zadie Herter MD 12/31/2023 02:56 AM EDT RP Workstation: [id number]   DG Chest Port 1 View Result Date: 12/31/2023 CLINICAL DATA:  Shortness of breath. EXAM: PORTABLE CHEST 1 VIEW COMPARISON:  August 15, 2023 FINDINGS: The cardiac silhouette is mildly enlarged and unchanged in size. Low lung volumes are noted. Mild  areas of atelectasis and/or infiltrate are seen within the bilateral lung bases. No pleural effusion or pneumothorax is identified. Multilevel degenerative changes seen throughout the thoracic spine. IMPRESSION: Stable cardiomegaly and low lung volumes with mild bibasilar atelectasis and/or infiltrate. Electronically Signed   By: Virgle Grime M.D.   On: 12/31/2023 01:13        Scheduled Meds:  amiodarone   100 mg Oral Daily   Chlorhexidine  Gluconate Cloth  6 each Topical Daily   enoxaparin (LOVENOX) injection  50 mg Subcutaneous Q24H   leptospermum manuka honey  1 Application Topical Daily   methimazole   2.5 mg Oral Daily   metoprolol  tartrate  5 mg Intravenous Q6H   Continuous Infusions:  albumin human     piperacillin-tazobactam (ZOSYN)  IV Stopped (01/01/24 1224)     LOS: 1 day    Time spent: 45 minutes    Hilda Lovings, MD [facility]   To contact the attending provider between 7A-7P or the covering provider during after hours 7P-7A, please log into the web site [email or web address] and access using universal [place] password for that web site. If you do not have the password, please call the hospital operator.  01/01/2024, 3:57 PM

## 2024-01-01 NOTE — Consult Note (Signed)
 Cardiology Consultation   Patient ID: Rhonda Marshall MRN: [id number]; DOB: Aug 06, 1950  Admit date: 12/31/2023 Date of Consult: 01/01/2024  PCP:  Daved Eriksson, MD   [facility] Providers Cardiologist:  Eilleen Grates, MD    Chief complaint: Shortness of breath. Reason of consult: Low blood pressures and congestive heart failure   Patient Profile: Rhonda Marshall is a 73 y.o. female with a hx of HFrEF, permanent atrial fibrillation, CKD stage III, hypothyroidism, anxiety who is being seen 01/01/2024 for the evaluation of CHF with low blood pressure at the request of Dr. Hildy Lowers.  History of Present Illness: Rhonda Marshall is a 73 year old female with above medical history who is followed by Dr. Lavonne Prairie.  Patient previously had echocardiogram in 2018 that showed EF 40%.  Nuclear stress test at that time showed no evidence of ischemia.  She wore a cardiac monitor in 04/2017 that showed atrial fibrillation with aberrant conduction, average heart rate in atrial fibrillation is at the acceptable upper limit, no pauses.  Later, echocardiogram in 01/2022 showed EF down to 30-35%, mildly reduced RV systolic function, normal PA systolic pressure, mild mitral valve regurgitation.  Most recent echocardiogram from 03/30/2023 showed EF 30-35%, normal RV systolic function, severe left atrial dilation, moderate right atrial dilation, mild MR.  She was admitted in 08/2023 with acute on chronic systolic heart failure and A-fib with RVR.  Treated with IV diuresis and atrial fibrillation was treated with rate control.  Digoxin  was added and she was continued on amiodarone  and metoprolol .  She was seen in the heart failure TOC clinic after discharge.  At that time, heart rate was very well-controlled.  There was some concern of bradycardia with heart rate occasionally dipping into the 40s.  Digoxin  level was elevated, and digoxin  was ultimately d/c'd.  She was most recently seen by cardiology on  10/06/2023 for a follow-up appointment.  At that time, patient reported feeling well.  Heart rate was well-controlled and there were no symptoms of bradycardia.  She was euvolemic on exam.  Transitioned to Lasix  20 mg as needed.  Remained on amiodarone , metoprolol , Eliquis .  Patient was seen in the ER on 6/16 after she lost her balance and fell.  Reports she had been walking in the home gym, her cane/rug tripped her up and she fell hitting her head on a metal weight.  CT head showed no acute intracranial abnormality.  ED provider recommended physical therapy to help increase patient's strength and mobility but patient declined.  She was discharged from the ED  Patient presented to the ED at [facility] on 6/19.  Caregiver noticed a dark bruise under her neck, and patient had been struggling to swallow and spitting out her food.  Initial vital signs showed oxygen 89% on room air, BP 148/96, heart rate 98 bpm.  Patient was started on nasal cannula, did require BiPAP in the ED.   Labs in the ED significant for: - BNP >4,500 - Creatinine 1.71 - K 5.1 - Chloride 91 - Lactic Acid 3.2>1.9>1.5 - WBC 8.0 - Hemoglobin 13.4 - Blood cultures grew Enterococcus [name]   Chest x-ray showed stable cardiomegaly, low lung volumes with mild bibasilar atelectasis and/or infiltrate.  CT soft tissue neck showed numerous nodular opacities throughout the visualized lung apices, suspicious for pneumonia, aspiration, and/or septic emboli.  Also noted large right pleural effusion, debris within the esophagus placing patient at risk for aspiration, mild diffuse subcutaneous edema.  Patient was admitted to the internal medicine service for treatment  of severe sepsis, multifocal versus aspiration pneumonia.  GI was consulted due to possible debris/food in the esophagus.  Recommended speech therapy.  Considering endoscopy once patient recovers from pneumonia.  Cardiology was consulted for acute on chronic CHF.  At the time of  evaluation patient is in stepdown. No family present at bedside Denies anginal chest pain or heart failure symptoms. Does have shortness of breath with effort related activities.   Past Medical History:  Diagnosis Date   Anxiety    Atrial fibrillation (HCC)    newly diagnosed 10/2010   Family history of adverse reaction to anesthesia    Sister's BP drops    Hip fracture (HCC) 01/18/2022   Hypertension    Rheumatoid arthritis(714.0)    Subclinical hyperthyroidism     Past Surgical History:  Procedure Laterality Date   Arm surgery     fracture, right arm   CARDIOVERSION N/A 08/10/2014   Procedure: CARDIOVERSION;  Surgeon: Liza Riggers, MD;  Location: [facility] ENDOSCOPY;  Service: Cardiovascular;  Laterality: N/A;   CARDIOVERSION N/A 01/22/2022   Procedure: CARDIOVERSION;  Surgeon: Sonny Dust, MD;  Location: [facility] ENDOSCOPY;  Service: Cardiovascular;  Laterality: N/A;   CATARACT EXTRACTION     DIRECT LARYNGOSCOPY N/A 09/02/2019   Procedure: DIRECT LARYNGOSCOPY WITH BIOPSY;  Surgeon: Prescott Brodie, MD;  Location: [facility];  Service: ENT;  Laterality: N/A;   HIP ARTHROPLASTY Right 01/19/2022   Procedure: ARTHROPLASTY BIPOLAR HIP (HEMIARTHROPLASTY);  Surgeon: Osa Blase, MD;  Location: [facility] ORS;  Service: Orthopedics;  Laterality: Right;   IR GASTROSTOMY TUBE MOD SED  10/17/2019   IR GASTROSTOMY TUBE REMOVAL  01/03/2020   TEE WITHOUT CARDIOVERSION N/A 01/22/2022   Procedure: TRANSESOPHAGEAL ECHOCARDIOGRAM (TEE);  Surgeon: Sonny Dust, MD;  Location: [facility] ENDOSCOPY;  Service: Cardiovascular;  Laterality: N/A;   TUBAL LIGATION         Scheduled Meds:  amiodarone   100 mg Oral Daily   Chlorhexidine  Gluconate Cloth  6 each Topical Daily   enoxaparin (LOVENOX) injection  50 mg Subcutaneous Q24H   leptospermum manuka honey  1 Application Topical Daily   methimazole   2.5 mg Oral Daily   metoprolol  tartrate  5 mg Intravenous Q6H   Continuous  Infusions:  sodium chloride      norepinephrine (LEVOPHED) Adult infusion     piperacillin-tazobactam (ZOSYN)  IV 3.375 g (01/01/24 1652)   PRN Meds: acetaminophen  **OR** acetaminophen , albuterol, ALPRAZolam , diclofenac Sodium, ondansetron  **OR** ondansetron  (ZOFRAN ) IV, traZODone  Allergies:    Allergies  Allergen Reactions   Clindamycin/Lincomycin Rash   Latex Rash   Plaquenil  [Hydroxychloroquine ] Other (See Comments)    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     Social History:   Social History   Socioeconomic History   Marital status: Widowed    Spouse name: Not on file   Number of children: 1   Years of education: Not on file   Highest education level: Not on file  Occupational History   Occupation: laborer    Comment: [employer]   Occupation: retired  Tobacco Use   Smoking status: Former    Current packs/day: 0.00    Average packs/day: 0.3 packs/day for 54.2 years (13.6 ttl pk-yrs)    Types: Cigarettes    Start date: 44    Quit date: 10/2019    Years since quitting: 4.2    Passive exposure: Past   Smokeless tobacco: Never  Vaping Use   Vaping status: Never Used  Substance and Sexual Activity  Alcohol use: Not Currently    Alcohol/week: 0.0 standard drinks of alcohol   Drug use: No   Sexual activity: Not on file  Other Topics Concern   Not on file  Social History Narrative   Not on file   Social Drivers of Health   Financial Resource Strain: Low Risk  (08/18/2023)   Overall Financial Resource Strain (CARDIA)    Difficulty of Paying Living Expenses: Not very hard  Food Insecurity: No Food Insecurity (12/31/2023)   Hunger Vital Sign    Worried About Running Out of Food in the Last Year: Never true    Ran Out of Food in the Last Year: Never true  Transportation Needs: No Transportation Needs (12/31/2023)   PRAPARE - Administrator, Civil Service (Medical): No    Lack of Transportation (Non-Medical): No  Physical Activity: Not on file  Stress: Not  on file  Social Connections: Moderately Integrated (12/31/2023)   Social Connection and Isolation Panel    Frequency of Communication with Friends and Family: Once a week    Frequency of Social Gatherings with Friends and Family: More than three times a week    Attends Religious Services: 1 to 4 times per year    Active Member of [employer] or Organizations: No    Attends Banker Meetings: 1 to 4 times per year    Marital Status: Widowed  Intimate Partner Violence: Not At Risk (12/31/2023)   Humiliation, Afraid, Rape, and Kick questionnaire    Fear of Current or Ex-Partner: No    Emotionally Abused: No    Physically Abused: No    Sexually Abused: No    Family History:   Family History  Problem Relation Age of Onset   Heart attack Mother 66       pacemaker age 15   Hypothyroidism Mother    Cancer Father        colon and prostate   Hypothyroidism Sister    Cancer Sister    Diabetes Brother    CAD Brother 72   Cancer Brother        Renal     ROS:  Review of Systems  Constitutional: Positive for malaise/fatigue.  Cardiovascular:  Negative for chest pain, claudication, irregular heartbeat, leg swelling, near-syncope, orthopnea, palpitations, paroxysmal nocturnal dyspnea and syncope.  Respiratory:  Positive for shortness of breath.   Hematologic/Lymphatic: Negative for bleeding problem.  Musculoskeletal:  Positive for falls.   Physical Exam/Data: Vitals:   01/01/24 1700 01/01/24 1724 01/01/24 1800 01/01/24 1805  BP: (!) 73/46 (!) 85/64 (!) 88/59   Pulse: (!) 101 97 95 (!) 105  Resp: 19 (!) 22 20 (!) 24  Temp: (!) 100.4 F (38 C) 100 F (37.8 C) (!) 100.4 F (38 C) (!) 100.4 F (38 C)  TempSrc:      SpO2: 100% 100% 96% 94%  Weight:      Height:        Intake/Output Summary (Last 24 hours) at 01/01/2024 1912 Last data filed at 01/01/2024 1800 Gross per 24 hour  Intake 118.77 ml  Output 850 ml  Net -731.23 ml      12/31/2023    4:26 AM 12/28/2023   12:05 PM  11/19/2023    3:37 PM  Last 3 Weights  Weight (lbs) 109 lb 109 lb 107 lb 12.8 oz  Weight (kg) 49.442 kg 49.442 kg 48.898 kg     Body mass index is 19.94 kg/m.  General: Hemodynamically stable, no  acute distress, appears older than stated age  HEENT: Bruises on the left forehead, ecchymoses in the submandibular/neck region Neck: + JVD Vascular: No carotid bruits; Distal pulses 2+ bilaterally Cardiac: Irregularly irregular, tachycardic, no murmurs rubs or gallops appreciated secondary to tachycardia Lungs: Rhonchi's noted bilaterally with decreased breath sounds at the bases.  Equal rise and fall of chest cavity, no wheezing Abd: soft, nontender, no hepatomegaly  Ext: no edema, warm to touch Psych:  Normal affect   EKG:  The EKG was personally reviewed and demonstrates:   12/31/2023: Wide-complex tachycardia 01/01/2024: Rate controlled atrial fibrillation, left bundle branch block  Telemetry:  Telemetry was personally reviewed and demonstrates: Atrial fibrillation without sustained arrhythmias  Relevant CV Studies: Echocardiogram 01/01/2024 1. Left ventricular ejection fraction, by estimation, is 30 to 35%. The  left ventricle has moderately decreased function. The left ventricle  demonstrates global hypokinesis. The left ventricular internal cavity size  was moderately dilated. Left  ventricular diastolic parameters are consistent with Grade II diastolic  dysfunction (pseudonormalization). Elevated left ventricular end-diastolic  pressure.   2. Right ventricular systolic function is normal. The right ventricular  size is normal.   3. Left atrial size was moderately dilated.   4. Right atrial size was moderately dilated.   5. The mitral valve is abnormal. Moderate mitral valve regurgitation. No  evidence of mitral stenosis.   6. Tricuspid valve regurgitation is moderate.   7. The aortic valve is normal in structure. There is moderate  calcification of the aortic valve. There is  moderate thickening of the  aortic valve. Aortic valve regurgitation is not visualized. Aortic valve  sclerosis is present, with no evidence of  aortic valve stenosis.   8. The inferior vena cava is dilated in size with >50% respiratory  variability, suggesting right atrial pressure of 8 mmHg.    Laboratory Data: High Sensitivity Troponin:  No results for input(s): TROPONINIHS in the last 720 hours.   Chemistry Recent Labs  Lab 12/31/23 1552 12/31/23 2309 01/01/24 0828  NA 130* 131* 133*  K 5.2* 4.6 4.6  CL 95* 96* 96*  CO2 25 26 25   GLUCOSE 74 83 72  BUN 31* 31* 29*  CREATININE 1.65* 1.61* 1.45*  CALCIUM 8.8* 8.8* 9.0  MG  --   --  1.9  GFRNONAA 32* 33* 38*  ANIONGAP 10 9 12     Recent Labs  Lab 01/01/24 0828  PROT 6.3*  ALBUMIN 2.4*  AST 43*  ALT 36  ALKPHOS 174*  BILITOT 2.1*   Lipids No results for input(s): CHOL, TRIG, HDL, LABVLDL, LDLCALC, CHOLHDL in the last 168 hours.  Hematology Recent Labs  Lab 12/31/23 0108 12/31/23 2309 01/01/24 0828  WBC 8.0 11.5* 11.6*  RBC 4.68 4.21 4.43  HGB 13.4 12.0 12.5  HCT 44.2 38.8 40.4  MCV 94.4 92.2 91.2  MCH 28.6 28.5 28.2  MCHC 30.3 30.9 30.9  RDW 18.6* 18.5* 18.6*  PLT 262 183 179   Thyroid  No results for input(s): TSH, FREET4 in the last 168 hours.  BNP Recent Labs  Lab 12/31/23 0108  BNP >4,500.0*    DDimer No results for input(s): DDIMER in the last 168 hours.  Radiology/Studies:  ECHOCARDIOGRAM COMPLETE Result Date: 01/01/2024    ECHOCARDIOGRAM REPORT   Patient Name:   Rhonda Marshall Date of Exam: 01/01/2024 Medical Rec #:  [id number]        Height:       62.0 in Accession #:    [id number]  Weight:       109.0 lb Date of Birth:  02-04-1951        BSA:          1.477 m Patient Age:    73 years         BP:           129/00 mmHg Patient Gender: F                HR:           85 bpm. Exam Location:  Inpatient Procedure: 2D Echo, Color Doppler and Cardiac Doppler (Both Spectral and  Color            Flow Doppler were utilized during procedure). Indications:    I50.21 Acute systolic (congestive) heart failure  History:        Patient has prior history of Echocardiogram examinations, most                 recent 03/30/2023.  Sonographer:    Andrena Bang Referring Phys: [id number] DANIEL V THOMPSON IMPRESSIONS  1. Left ventricular ejection fraction, by estimation, is 30 to 35%. The left ventricle has moderately decreased function. The left ventricle demonstrates global hypokinesis. The left ventricular internal cavity size was moderately dilated. Left ventricular diastolic parameters are consistent with Grade II diastolic dysfunction (pseudonormalization). Elevated left ventricular end-diastolic pressure.  2. Right ventricular systolic function is normal. The right ventricular size is normal.  3. Left atrial size was moderately dilated.  4. Right atrial size was moderately dilated.  5. The mitral valve is abnormal. Moderate mitral valve regurgitation. No evidence of mitral stenosis.  6. Tricuspid valve regurgitation is moderate.  7. The aortic valve is normal in structure. There is moderate calcification of the aortic valve. There is moderate thickening of the aortic valve. Aortic valve regurgitation is not visualized. Aortic valve sclerosis is present, with no evidence of aortic valve stenosis.  8. The inferior vena cava is dilated in size with >50% respiratory variability, suggesting right atrial pressure of 8 mmHg. FINDINGS  Left Ventricle: Left ventricular ejection fraction, by estimation, is 30 to 35%. The left ventricle has moderately decreased function. The left ventricle demonstrates global hypokinesis. Definity contrast agent was given IV to delineate the left ventricular endocardial borders. Strain was performed and the global longitudinal strain is indeterminate. The left ventricular internal cavity size was moderately dilated. There is no left ventricular hypertrophy. Left ventricular diastolic  parameters are consistent with Grade II diastolic dysfunction (pseudonormalization). Elevated left ventricular end-diastolic pressure. Right Ventricle: The right ventricular size is normal. No increase in right ventricular wall thickness. Right ventricular systolic function is normal. Left Atrium: Left atrial size was moderately dilated. Right Atrium: Right atrial size was moderately dilated. Pericardium: There is no evidence of pericardial effusion. Mitral Valve: The mitral valve is abnormal. There is mild thickening of the mitral valve leaflet(s). Moderate mitral valve regurgitation. No evidence of mitral valve stenosis. Tricuspid Valve: The tricuspid valve is normal in structure. Tricuspid valve regurgitation is moderate . No evidence of tricuspid stenosis. Aortic Valve: The aortic valve is normal in structure. There is moderate calcification of the aortic valve. There is moderate thickening of the aortic valve. Aortic valve regurgitation is not visualized. Aortic valve sclerosis is present, with no evidence of aortic valve stenosis. Aortic valve mean gradient measures 4.0 mmHg. Aortic valve peak gradient measures 7.2 mmHg. Aortic valve area, by VTI measures 1.81 cm. Pulmonic Valve: The pulmonic valve was normal in  structure. Pulmonic valve regurgitation is mild. No evidence of pulmonic stenosis. Aorta: The aortic root is normal in size and structure. Venous: The inferior vena cava is dilated in size with greater than 50% respiratory variability, suggesting right atrial pressure of 8 mmHg. IAS/Shunts: No atrial level shunt detected by color flow Doppler. Additional Comments: 3D was performed not requiring image post processing on an independent workstation and was indeterminate.  LEFT VENTRICLE PLAX 2D LVIDd:         4.50 cm      Diastology LVIDs:         3.90 cm      LV e' lateral:   6.64 cm/s LV PW:         1.10 cm      LV E/e' lateral: 18.7 LV IVS:        0.90 cm LVOT diam:     2.00 cm LV SV:         33 LV SV  Index:   23 LVOT Area:     3.14 cm  LV Volumes (MOD) LV vol d, MOD A2C: 157.0 ml LV vol d, MOD A4C: 148.0 ml LV vol s, MOD A2C: 99.7 ml LV vol s, MOD A4C: 104.0 ml LV SV MOD A2C:     57.3 ml LV SV MOD A4C:     148.0 ml LV SV MOD BP:      50.6 ml RIGHT VENTRICLE TAPSE (M-mode): 1.0 cm LEFT ATRIUM             Index LA diam:        4.40 cm 2.98 cm/m LA Vol (A2C):   82.0 ml 55.52 ml/m LA Vol (A4C):   70.2 ml 47.53 ml/m LA Biplane Vol: 76.6 ml 51.86 ml/m  AORTIC VALVE AV Area (Vmax):    2.03 cm AV Area (Vmean):   1.74 cm AV Area (VTI):     1.81 cm AV Vmax:           134.00 cm/s AV Vmean:          89.800 cm/s AV VTI:            0.184 m AV Peak Grad:      7.2 mmHg AV Mean Grad:      4.0 mmHg LVOT Vmax:         86.60 cm/s LVOT Vmean:        49.800 cm/s LVOT VTI:          0.106 m LVOT/AV VTI ratio: 0.58  AORTA Ao Asc diam: 3.50 cm MITRAL VALVE                TRICUSPID VALVE MV Area (PHT): 5.95 cm     TR Peak grad:   36.5 mmHg MR Peak grad: 92.9 mmHg     TR Vmax:        302.00 cm/s MR Mean grad: 56.0 mmHg MR Vmax:      482.00 cm/s   SHUNTS MR Vmean:     349.0 cm/s    Systemic VTI:  0.11 m MV E velocity: 124.00 cm/s  Systemic Diam: 2.00 cm MV A velocity: 39.20 cm/s MV E/A ratio:  3.16 Janelle Mediate MD Electronically signed by Janelle Mediate MD Signature Date/Time: 01/01/2024/2:35:36 PM    Final    CT CHEST ABDOMEN PELVIS WO CONTRAST Result Date: 12/31/2023 CLINICAL DATA:  73 year old female with sepsis. Abnormal lung apices on neck CT 0234 hours today. EXAM: CT CHEST, ABDOMEN AND PELVIS WITHOUT CONTRAST  TECHNIQUE: Multidetector CT imaging of the chest, abdomen and pelvis was performed following the standard protocol without IV contrast. RADIATION DOSE REDUCTION: This exam was performed according to the departmental dose-optimization program which includes automated exposure control, adjustment of the mA and/or kV according to patient size and/or use of iterative reconstruction technique. COMPARISON:  CT Chest, Abdomen,  and Pelvis 03/10/2023. Neck CT earlier today. FINDINGS: CT CHEST FINDINGS Cardiovascular: Cardiomegaly appears progressed since last year. No pericardial effusion. Advanced calcified coronary artery atherosclerosis. Calcified aortic atherosclerosis. Vascular patency is not evaluated in the absence of IV contrast. Mediastinum/Nodes: Bulky chronic thyroid  goiter, noncontrast appearance grossly stable from last year, and previously evaluated on nuclear Medicine thyroid  scan in 2018. Retained food or secretions in the proximal thoracic esophagus, but nondilated esophagus, decompressed distally. No superimposed mediastinal mass or lymphadenopathy. Lungs/Pleura: Moderate layering right pleural effusion with simple fluid density. Small layering left pleural effusion with simple fluid density. Lower lung volumes and atelectatic changes to the trachea and central airways. Widespread bilateral peribronchial patchy and irregular opacity. All lobes affected. Right greater than left posterior basal segment lower lobe consolidation with air bronchograms. No cavitating lung nodules. Musculoskeletal: Visible osseous structures appear stable from last year. Degenerative changes in the spine. No acute or suspicious osseous lesion. CT ABDOMEN PELVIS FINDINGS Hepatobiliary: Small volume perihepatic ascites with simple fluid density is new from last year. Layering gallbladder sludge or vicarious contrast excretion in the gallbladder lumen. No strong evidence of acute cholecystitis. No discrete liver lesion on this noncontrast exam. Increased liver density which might reflect amiodarone  therapy. Pancreas: Partially atrophied. Spleen: Small volume perisplenic ascites with simple fluid density. No splenomegaly. Adrenals/Urinary Tract: Negative noncontrast kidneys and adrenal glands. Foley catheter within the urinary bladder. Limited bladder detail due to hip arthroplasty streak artifact. Stomach/Bowel: Small volume ascites and generalized  mesenteric congestion/edema appearance. Nondilated large and small bowel. Diverticulosis of the sigmoid colon in the pelvis. Diverticulosis at the hepatic flexure. Colon. No discrete bowel inflammation. Appendix not delineated. No pneumoperitoneum. Vascular/Lymphatic: Advanced Aortoiliac calcified atherosclerosis. Vascular patency is not evaluated in the absence of IV contrast. No lymphadenopathy identified. Reproductive: Negative noncontrast appearance. Other: Moderate volume simple fluid density ascites in the pelvis. Musculoskeletal: Intermittent advanced lumbar spine disc and endplate degeneration (with chronic vacuum disc) appears stable from last year. Chronic right hip arthroplasty. No acute or suspicious osseous lesion identified. Small volume of abnormal soft tissue gas in the right flank, subcutaneous and abutting the superficial muscle layer on series 2, image 90. No regional fluid collection or other acute findings. Generalized abdominal wall, bilateral flank subcutaneous edema/anasarca which is new from last year. IMPRESSION: 1. Diffuse bilateral Bronchopneumonia, bilateral lower lobe consolidation. Moderate Right and small left layering pleural effusions with simple fluid density favoring transudate. 2. Body wall Anasarca. Small volume abdominal and moderate volume simple fluid density Ascites. Superimposed Small volume of nonspecific subcutaneous soft tissue gas along the right posterior flank. But no other regional abnormality. This is nonspecific, has there been recent subcutaneous injection here? 3. Cardiomegaly, appears progressed from last year. No pericardial effusion. Advanced calcified coronary artery and Aortic Atherosclerosis (ICD10-I70.0). 4. No other acute or inflammatory process identified in the noncontrast chest, abdomen, or pelvis. Bulky Chronic thyroid  goiter. Gallbladder sludge or less likely vicarious contrast excretion. Electronically Signed   By: Marlise Simpers M.D.   On: 12/31/2023  06:20   CT Soft Tissue Neck Wo Contrast Result Date: 12/31/2023 CLINICAL DATA:  Dysphagia, post trauma, submandibular swelling EXAM: CT NECK WITHOUT  CONTRAST TECHNIQUE: Multidetector CT imaging of the neck was performed following the standard protocol without intravenous contrast. RADIATION DOSE REDUCTION: This exam was performed according to the departmental dose-optimization program which includes automated exposure control, adjustment of the mA and/or kV according to patient size and/or use of iterative reconstruction technique. COMPARISON:  None Available. FINDINGS: Pharynx and larynx: Motion limited without obvious mass or swelling. Salivary glands: No inflammation, mass, or stone. Thyroid : Enlarged heterogeneous thyroid . Lymph nodes: None enlarged or abnormal density. Vascular: Nondiagnostic evaluation without contrast. Limited intracranial: Negative. Visualized orbits: Negative. Mastoids and visualized paranasal sinuses: Clear. Skeleton: No acute or aggressive process. Upper chest: Numerous nodular opacities throughout the visualized lung apices, suspicious for pneumonia and potentially aspiration. Large right pleural effusion. Other: Mild diffuse subcutaneous edema/stranding. No discrete fluid collection. Debris within the esophagus. IMPRESSION: 1. Numerous nodular opacities throughout the visualized lung apices, suspicious for pneumonia, aspiration, and/or septic emboli. Large right pleural effusion. Recommend follow-up CT chest to ensure resolution. 2. Debris within the esophagus, which places the patient at risk for aspiration. 3. Mild diffuse subcutaneous edema, possibly anasarca. No discrete fluid collection. 4. Enlarged and heterogeneous thyroid . Recommend thyroid  US  (ref: J Am Coll Radiol. 2015 Feb;12(2): 143-50). Electronically Signed   By: Stevenson Elbe M.D.   On: 12/31/2023 03:31   CT Maxillofacial Wo Contrast Result Date: 12/31/2023 EXAM: CT OF THE FACE WITHOUT CONTRAST 12/31/2023  02:48:52 AM TECHNIQUE: CT of the face was performed without the administration of intravenous contrast. Multiplanar reformatted images are provided for review. Automated exposure control, iterative reconstruction, and/or weight based adjustment of the mA/kV was utilized to reduce the radiation dose to as low as reasonably achievable. COMPARISON: None available. CLINICAL HISTORY: Facial trauma, blunt. t fell Monday face forward and was seen. Pt was discharged and her caregiver noticed a dark bruised under her neck when she got home. Pt has been struggling to swallow and spitting out her food. Pt presents with blue discoloration in her face. FINDINGS: FACIAL BONES: No acute facial fracture. No mandibular dislocation. No suspicious bone lesion. ORBITS: Globes are intact. No acute traumatic injury. No inflammatory change. SINUSES AND MASTOIDS: No acute abnormality. SOFT TISSUES: Soft tissue irregularity/laceration overlying the frontal bone. IMPRESSION: 1. Soft tissue irregularity/laceration overlying the frontal bone. 2. No maxillofacial fracture. Electronically signed by: Zadie Herter MD 12/31/2023 02:56 AM EDT RP Workstation: [id number]   DG Chest Port 1 View Result Date: 12/31/2023 CLINICAL DATA:  Shortness of breath. EXAM: PORTABLE CHEST 1 VIEW COMPARISON:  August 15, 2023 FINDINGS: The cardiac silhouette is mildly enlarged and unchanged in size. Low lung volumes are noted. Mild areas of atelectasis and/or infiltrate are seen within the bilateral lung bases. No pleural effusion or pneumothorax is identified. Multilevel degenerative changes seen throughout the thoracic spine. IMPRESSION: Stable cardiomegaly and low lung volumes with mild bibasilar atelectasis and/or infiltrate. Electronically Signed   By: Virgle Grime M.D.   On: 12/31/2023 01:13   Assessment and Plan:  Acute on chronic systolic heart failure Mild MR  Echocardiogram 2018 approximately 40% Echocardiogram in 2023 LVEF 30-35%, mild  reduction in RV function Echocardiogram 2024 LVEF 30-35%, normal RV function Echocardiogram during this hospitalization illustrates LVEF at 30-35%, global hypokinesis, grade 2 diastolic dysfunction right ventricular size and function is normal, biatrial dilatation moderate moderate TR, estimated RAP 8 mmHg BNP is elevated with positive JVP PTA, patient was on metoprolol  succinate 50 mg daily and lasix  PRN  Though clinically she does not have lower extremity swelling clinically she is  volume up given her JVP, elevated BNP levels, elevated RAP. Her acute heart failure exacerbation is likely exacerbated by underlying sepsis and permanent atrial fibrillation.   Unable to uptitrate diuretics due to soft blood pressures  Patient has been given IV Lasix , speaking to the nursing staff she actually had 1350 cc of urine output on December 31, 2023 (is documented as input) Will focus on treating the underlying sepsis and once blood pressure allow facilitate diuresis and up titration of GDMT.  Permanent atrial fibrillation Rate control: Held secondary to soft blood pressures, was on metoprolol  prior to arrival Rhythm control: Amiodarone  Thromboembolic prophylaxis: Currently on Lovenox Telemetry independently reviewed Repeat EKG ordered and independently reviewed  AKI  Creatinine elevated to 1.65 yesterday. Improved to 1.45 today after diuresis  Baseline creatinine appears to be around 1.1-1.2 Monitor BUN and creatinine. Renally dose medications  Sepsis: Acute respiratory failure with hypoxia. Imaging findings concerning for pneumonia as well as bilateral pleural effusion Patient is hypotensive, tachycardic, positive blood cultures, febrile. Currently on IV Zosyn Reached out to attending physician with concerns for progression of her sepsis and the need for reevaluation. Recommended checking procalcitonin, blood cultures as appropriate, lactic acid, and pressor support if needed Primary team has  reached out to critical care/pulmonary medicine for further evaluation and management Recommend evaluating the possibility of ultrasound-guided thoracentesis if pleural effusion is notable and easily accessible.  This could serve as both therapeutic and diagnostic  Thyroid  disease: Documented to have hypothyroidism. However, medication was notes that she is on methimazole . Deferred management to primary team  Medical decision making: High Discussed management of acute on chronic heart failure with reduced EF, underlying sepsis. Time spent evaluating the patient, independently reviewing labs 12/31/2023 till now, last 24-hour vital signs and strict I's and O's, and telemetry Echocardiogram from today 01/01/2024 independently reviewed Prescription drug management. Discussed care with nursing staff and reached out to attending physician with regards to concerns for ongoing/progressive sepsis and the need for reevaluation Non-face-to-face time included and placing orders and completion of medical records  Risk Assessment/Risk Scores:       [place]  Heart Association (NYHA) Functional Class NYHA Class III  CHA2DS2-VASc Score = 4   This indicates a 4.8% annual risk of stroke. The patient's score is based upon: CHF History: 1 HTN History: 0 Diabetes History: 0 Stroke History: 0 Vascular Disease History: 1 Age Score: 1 Gender Score: 1     For questions or updates, please contact [facility] Please consult [email or web address] for contact info under    Signed, [employer], DO  01/01/2024

## 2024-01-01 NOTE — Progress Notes (Signed)
 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   Medical History: Past Medical History:  Diagnosis Date   Anxiety    Atrial fibrillation (HCC)    newly diagnosed 10/2010   Family history of adverse reaction to anesthesia    Sister's BP drops    Hip fracture (HCC) 01/18/2022   Hypertension    Rheumatoid arthritis(714.0)    Subclinical hyperthyroidism     Medications:  - on Eliquis  PTA  Assessment: Patient is a 73 y.o F with hx afib on Eliquis  PTA who presented to the ED on 12/31/23 with c/o dysphagia and dyspnea. Chest/abd CT on 01/01/24 showed diffuse bilateral bronchopneumonia.  Neck CT on 12/31/23 showed debris within the esophagus. Pharmacy has been consulted on 01/01/24 to transition patient from Eliquis  to therapeutic dose LMWH since pt is not able to take oral meds due to at risk for aspiration.  Today, 01/01/2024: -  Hgb and plts ok - AKI (crcl<30)  Goal of Therapy:  Anti-Xa level 0.6-1 units/ml 4hrs after LMWH dose given Monitor platelets by anticoagulation protocol: Yes   Plan:  - d/c Eliquis  - Lovenox 50 mg SQ q24h - cbc q72h - monitor for s/sx bleeding   Nabil Bubolz  P 01/01/2024,8:58 AM

## 2024-01-01 NOTE — Consult Note (Signed)
 [facility] Consultation Note  Referring Provider: [facility] Primary Care Physician:  Daved Eriksson, MD Primary Gastroenterologist:  Para Bold  Reason for Consultation:  abnormal CT  HPI: Rhonda Marshall is a 73 y.o. female admitted dyspnea, found to have suspected multifocal pneumonia with possible aspiration.  CT also showed possible debris in esophagus.  Patient has tolerated water  and apple sauce this morning without vomiting; she has no sialorrhea.  Has had some troubles with swallowing pills over the past several weeks.  Has not had endoscopy for many years.  No blood in stool.   Past Medical History:  Diagnosis Date   Anxiety    Atrial fibrillation (HCC)    newly diagnosed 10/2010   Family history of adverse reaction to anesthesia    Sister's BP drops    Hip fracture (HCC) 01/18/2022   Hypertension    Rheumatoid arthritis(714.0)    Subclinical hyperthyroidism     Past Surgical History:  Procedure Laterality Date   Arm surgery     fracture, right arm   CARDIOVERSION N/A 08/10/2014   Procedure: CARDIOVERSION;  Surgeon: Liza Riggers, MD;  Location: [facility] ENDOSCOPY;  Service: Cardiovascular;  Laterality: N/A;   CARDIOVERSION N/A 01/22/2022   Procedure: CARDIOVERSION;  Surgeon: Sonny Dust, MD;  Location: [facility] ENDOSCOPY;  Service: Cardiovascular;  Laterality: N/A;   CATARACT EXTRACTION     DIRECT LARYNGOSCOPY N/A 09/02/2019   Procedure: DIRECT LARYNGOSCOPY WITH BIOPSY;  Surgeon: Prescott Brodie, MD;  Location: [facility];  Service: ENT;  Laterality: N/A;   HIP ARTHROPLASTY Right 01/19/2022   Procedure: ARTHROPLASTY BIPOLAR HIP (HEMIARTHROPLASTY);  Surgeon: Osa Blase, MD;  Location: [facility] ORS;  Service: Orthopedics;  Laterality: Right;   IR GASTROSTOMY TUBE MOD SED  10/17/2019   IR GASTROSTOMY TUBE REMOVAL  01/03/2020   TEE WITHOUT CARDIOVERSION N/A 01/22/2022   Procedure: TRANSESOPHAGEAL ECHOCARDIOGRAM (TEE);  Surgeon:  Sonny Dust, MD;  Location: [facility] ENDOSCOPY;  Service: Cardiovascular;  Laterality: N/A;   TUBAL LIGATION      Prior to Admission medications   Medication Sig Start Date End Date Taking? Authorizing Provider  acetaminophen  (TYLENOL ) 500 MG tablet Take 500 mg by mouth as needed for moderate pain.   Yes [provider]  ALPRAZolam  (XANAX ) 1 MG tablet Take 1 mg by mouth 4 (four) times daily as needed for anxiety.   Yes [provider]  amiodarone  (PACERONE ) 200 MG tablet Take 0.5 tablets (100 mg total) by mouth daily. 10/23/23  Yes Eilleen Grates, MD  diclofenac Sodium (VOLTAREN) 1 % GEL Apply 2 g topically 4 (four) times daily as needed (pain).   Yes [provider]  docusate sodium  (COLACE) 100 MG capsule Take 1 capsule (100 mg total) by mouth 2 (two) times daily as needed for mild constipation. 04/02/23  Yes Sheikh, Omair Latif, DO  ELIQUIS  5 MG TABS tablet Take 1 tablet (5 mg total) by mouth 2 (two) times daily. 12/15/23  Yes Eilleen Grates, MD  furosemide  (LASIX ) 20 MG tablet Take 1 tablet (20 mg total) by mouth as needed. Patient taking differently: Take 20 mg by mouth as needed for fluid or edema. 10/06/23 01/04/24 Yes Meng, Hao, PA  loperamide  (IMODIUM  A-D) 2 MG tablet Take 2 mg by mouth 4 (four) times daily as needed for diarrhea or loose stools.   Yes [provider]  methimazole  (TAPAZOLE ) 5 MG tablet Take 0.5 tablets (2.5 mg total) by mouth daily. 11/20/23  Yes Thapa, [place], MD  metoprolol  succinate (  TOPROL -XL) 100 MG 24 hr tablet Take 100 mg by mouth daily. Take with or immediately following a meal.   Yes [provider]  Multiple Vitamin (MULTIVITAMIN WITH MINERALS) TABS tablet Take 1 tablet by mouth daily.    Yes [provider]  oxyCODONE -acetaminophen  (PERCOCET) 10-325 MG tablet Take 1 tablet by mouth every 4 (four) hours as needed for pain. 01/24/22  Yes Brown, Blaine K, PA-C  sodium chloride  1 g tablet Take 1 tablet (1 g total)  by mouth as needed. Patient taking differently: Take 1 g by mouth as needed (low sodium). 10/06/23  Yes Meng, Hao, PA  Vitamin D , Ergocalciferol , (DRISDOL) 1.25 MG (50000 UNIT) CAPS capsule Take 50,000 Units by mouth once a week. 12/28/21  Yes [provider]  doxycycline (VIBRAMYCIN) 100 MG capsule Take 1 capsule (100 mg total) by mouth 2 (two) times daily for 7 days. Patient not taking: Reported on 12/31/2023 12/28/23 01/04/24  Royann Cords, PA    Current Facility-Administered Medications  Medication Dose Route Frequency Provider Last Rate Last Admin   acetaminophen  (TYLENOL ) tablet 650 mg  650 mg Oral Q6H PRN Jannette Mend, Mir M, MD       Or   acetaminophen  (TYLENOL ) suppository 650 mg  650 mg Rectal Q6H PRN Jannette Mend, Mir M, MD   650 mg at 12/31/23 1426   albuterol (PROVENTIL) (2.5 MG/3ML) 0.083% nebulizer solution 2.5 mg  2.5 mg Nebulization Q2H PRN Jannette Mend, Mir M, MD       ALPRAZolam  (XANAX ) tablet 1 mg  1 mg Oral QID PRN Jannette Mend, Mir M, MD       amiodarone  (PACERONE ) tablet 100 mg  100 mg Oral Daily Jannette Mend, Mir M, MD       Chlorhexidine  Gluconate Cloth 2 % PADS 6 each  6 each Topical Daily Jannette Mend, Mir M, MD   6 each at 12/31/23 0912   diclofenac Sodium (VOLTAREN) 1 % topical gel 2 g  2 g Topical QID PRN Armenta Landau, MD       enoxaparin (LOVENOX) injection 50 mg  50 mg Subcutaneous Q24H Pham, Anh P, RPH       leptospermum manuka honey (MEDIHONEY) paste 1 Application  1 Application Topical Daily Jannette Mend, Mir M, MD   1 Application at 12/31/23 1428   methIMAzole  (TAPAZOLE ) tablet 2.5 mg  2.5 mg Oral Daily Jannette Mend, Mir M, MD       metoprolol  tartrate (LOPRESSOR ) injection 5 mg  5 mg Intravenous Q6H Jannette Mend, Mir M, MD   5 mg at 01/01/24 0547   ondansetron  (ZOFRAN ) tablet 4 mg  4 mg Oral Q6H PRN Jannette Mend, Mir M, MD       Or   ondansetron  (ZOFRAN ) injection 4 mg  4 mg Intravenous Q6H PRN Jannette Mend, Mir M, MD       piperacillin-tazobactam (ZOSYN) IVPB  3.375 g  3.375 g Intravenous Q8H Shireen Dory, RPH 12.5 mL/hr at 01/01/24 0814 3.375 g at 01/01/24 0814   traZODone (DESYREL) tablet 25 mg  25 mg Oral QHS PRN Gaylin Ke, MD        Allergies as of 12/31/2023 - Review Complete 12/31/2023  Allergen Reaction Noted   Clindamycin/lincomycin Rash 11/14/2010   Latex Rash 08/02/2014   Plaquenil  [hydroxychloroquine ] Other (See Comments) 03/29/2023    Family History  Problem Relation Age of Onset   Heart attack Mother 82       pacemaker age 85   Hypothyroidism Mother    Cancer Father  colon and prostate   Hypothyroidism Sister    Cancer Sister    Diabetes Brother    CAD Brother 46   Cancer Brother        Renal    Social History   Socioeconomic History   Marital status: Widowed    Spouse name: Not on file   Number of children: 1   Years of education: Not on file   Highest education level: Not on file  Occupational History   Occupation: laborer    Comment: [employer]   Occupation: retired  Tobacco Use   Smoking status: Former    Current packs/day: 0.00    Average packs/day: 0.3 packs/day for 54.2 years (13.6 ttl pk-yrs)    Types: Cigarettes    Start date: 31    Quit date: 10/2019    Years since quitting: 4.2    Passive exposure: Past   Smokeless tobacco: Never  Vaping Use   Vaping status: Never Used  Substance and Sexual Activity   Alcohol use: Not Currently    Alcohol/week: 0.0 standard drinks of alcohol   Drug use: No   Sexual activity: Not on file  Other Topics Concern   Not on file  Social History Narrative   Not on file   Social Drivers of Health   Financial Resource Strain: Low Risk  (08/18/2023)   Overall Financial Resource Strain (CARDIA)    Difficulty of Paying Living Expenses: Not very hard  Food Insecurity: No Food Insecurity (12/31/2023)   Hunger Vital Sign    Worried About Running Out of Food in the Last Year: Never true    Ran Out of Food in the Last Year: Never true   Transportation Needs: No Transportation Needs (12/31/2023)   PRAPARE - Administrator, Civil Service (Medical): No    Lack of Transportation (Non-Medical): No  Physical Activity: Not on file  Stress: Not on file  Social Connections: Moderately Integrated (12/31/2023)   Social Connection and Isolation Panel    Frequency of Communication with Friends and Family: Once a week    Frequency of Social Gatherings with Friends and Family: More than three times a week    Attends Religious Services: 1 to 4 times per year    Active Member of [employer] or Organizations: No    Attends Banker Meetings: 1 to 4 times per year    Marital Status: Widowed  Intimate Partner Violence: Not At Risk (12/31/2023)   Humiliation, Afraid, Rape, and Kick questionnaire    Fear of Current or Ex-Partner: No    Emotionally Abused: No    Physically Abused: No    Sexually Abused: No    Review of Systems: As per HPI, all others negative  Physical Exam: Vital signs in last 24 hours: Temp:  [97.9 F (36.6 C)-100.9 F (38.3 C)] 98.8 F (37.1 C) (06/20 0900) Pulse Rate:  [78-120] 97 (06/20 0900) Resp:  [10-23] 22 (06/20 0900) BP: (82-138)/(49-125) 129/99 (06/20 0900) SpO2:  [87 %-100 %] 92 % (06/20 0900) FiO2 (%):  [40 %] 40 % (06/19 1020)   General:   Frail, chronically ill-appearing, tachypnea at rest Head:  Normocephalic and atraumatic. Eyes:  Sclera clear, no icterus.   Conjunctiva pink. Ears:  Normal auditory acuity. Nose:  No deformity, discharge,  or lesions. Mouth:  No deformity or lesions.  Oropharynx pink but dry Neck:  Supple; no masses or thyromegaly. Lungs:  Visibly tachypneic at reset  Abdomen:  Soft, non tender, no  peritonitis    Msk:  Symmetrical without gross deformities. Normal posture. Pulses:  Normal pulses noted. Extremities:  Without clubbing or edema. Neurologic:  Alert and  oriented x4;  grossly normal neurologically. Skin:  Thin fragile skin, scattered ecchymoses,  otherwise Intact without significant lesions or rashes. Psych:  Alert and cooperative. Normal mood and affect.   Lab Results: Recent Labs    12/31/23 0108 12/31/23 2309 01/01/24 0828  WBC 8.0 11.5* 11.6*  HGB 13.4 12.0 12.5  HCT 44.2 38.8 40.4  PLT 262 183 179   BMET Recent Labs    12/31/23 1552 12/31/23 2309 01/01/24 0828  NA 130* 131* 133*  K 5.2* 4.6 4.6  CL 95* 96* 96*  CO2 25 26 25   GLUCOSE 74 83 72  BUN 31* 31* 29*  CREATININE 1.65* 1.61* 1.45*  CALCIUM 8.8* 8.8* 9.0   LFT Recent Labs    01/01/24 0828  PROT 6.3*  ALBUMIN 2.4*  AST 43*  ALT 36  ALKPHOS 174*  BILITOT 2.1*   PT/INR No results for input(s): LABPROT, INR in the last 72 hours.  Studies/Results: CT CHEST ABDOMEN PELVIS WO CONTRAST Result Date: 12/31/2023 CLINICAL DATA:  73 year old female with sepsis. Abnormal lung apices on neck CT 0234 hours today. EXAM: CT CHEST, ABDOMEN AND PELVIS WITHOUT CONTRAST TECHNIQUE: Multidetector CT imaging of the chest, abdomen and pelvis was performed following the standard protocol without IV contrast. RADIATION DOSE REDUCTION: This exam was performed according to the departmental dose-optimization program which includes automated exposure control, adjustment of the mA and/or kV according to patient size and/or use of iterative reconstruction technique. COMPARISON:  CT Chest, Abdomen, and Pelvis 03/10/2023. Neck CT earlier today. FINDINGS: CT CHEST FINDINGS Cardiovascular: Cardiomegaly appears progressed since last year. No pericardial effusion. Advanced calcified coronary artery atherosclerosis. Calcified aortic atherosclerosis. Vascular patency is not evaluated in the absence of IV contrast. Mediastinum/Nodes: Bulky chronic thyroid  goiter, noncontrast appearance grossly stable from last year, and previously evaluated on nuclear Medicine thyroid  scan in 2018. Retained food or secretions in the proximal thoracic esophagus, but nondilated esophagus, decompressed  distally. No superimposed mediastinal mass or lymphadenopathy. Lungs/Pleura: Moderate layering right pleural effusion with simple fluid density. Small layering left pleural effusion with simple fluid density. Lower lung volumes and atelectatic changes to the trachea and central airways. Widespread bilateral peribronchial patchy and irregular opacity. All lobes affected. Right greater than left posterior basal segment lower lobe consolidation with air bronchograms. No cavitating lung nodules. Musculoskeletal: Visible osseous structures appear stable from last year. Degenerative changes in the spine. No acute or suspicious osseous lesion. CT ABDOMEN PELVIS FINDINGS Hepatobiliary: Small volume perihepatic ascites with simple fluid density is new from last year. Layering gallbladder sludge or vicarious contrast excretion in the gallbladder lumen. No strong evidence of acute cholecystitis. No discrete liver lesion on this noncontrast exam. Increased liver density which might reflect amiodarone  therapy. Pancreas: Partially atrophied. Spleen: Small volume perisplenic ascites with simple fluid density. No splenomegaly. Adrenals/Urinary Tract: Negative noncontrast kidneys and adrenal glands. Foley catheter within the urinary bladder. Limited bladder detail due to hip arthroplasty streak artifact. Stomach/Bowel: Small volume ascites and generalized mesenteric congestion/edema appearance. Nondilated large and small bowel. Diverticulosis of the sigmoid colon in the pelvis. Diverticulosis at the hepatic flexure. Colon. No discrete bowel inflammation. Appendix not delineated. No pneumoperitoneum. Vascular/Lymphatic: Advanced Aortoiliac calcified atherosclerosis. Vascular patency is not evaluated in the absence of IV contrast. No lymphadenopathy identified. Reproductive: Negative noncontrast appearance. Other: Moderate volume simple fluid density ascites in the  pelvis. Musculoskeletal: Intermittent advanced lumbar spine disc and  endplate degeneration (with chronic vacuum disc) appears stable from last year. Chronic right hip arthroplasty. No acute or suspicious osseous lesion identified. Small volume of abnormal soft tissue gas in the right flank, subcutaneous and abutting the superficial muscle layer on series 2, image 90. No regional fluid collection or other acute findings. Generalized abdominal wall, bilateral flank subcutaneous edema/anasarca which is new from last year. IMPRESSION: 1. Diffuse bilateral Bronchopneumonia, bilateral lower lobe consolidation. Moderate Right and small left layering pleural effusions with simple fluid density favoring transudate. 2. Body wall Anasarca. Small volume abdominal and moderate volume simple fluid density Ascites. Superimposed Small volume of nonspecific subcutaneous soft tissue gas along the right posterior flank. But no other regional abnormality. This is nonspecific, has there been recent subcutaneous injection here? 3. Cardiomegaly, appears progressed from last year. No pericardial effusion. Advanced calcified coronary artery and Aortic Atherosclerosis (ICD10-I70.0). 4. No other acute or inflammatory process identified in the noncontrast chest, abdomen, or pelvis. Bulky Chronic thyroid  goiter. Gallbladder sludge or less likely vicarious contrast excretion. Electronically Signed   By: Marlise Simpers M.D.   On: 12/31/2023 06:20   CT Soft Tissue Neck Wo Contrast Result Date: 12/31/2023 CLINICAL DATA:  Dysphagia, post trauma, submandibular swelling EXAM: CT NECK WITHOUT CONTRAST TECHNIQUE: Multidetector CT imaging of the neck was performed following the standard protocol without intravenous contrast. RADIATION DOSE REDUCTION: This exam was performed according to the departmental dose-optimization program which includes automated exposure control, adjustment of the mA and/or kV according to patient size and/or use of iterative reconstruction technique. COMPARISON:  None Available. FINDINGS: Pharynx and  larynx: Motion limited without obvious mass or swelling. Salivary glands: No inflammation, mass, or stone. Thyroid : Enlarged heterogeneous thyroid . Lymph nodes: None enlarged or abnormal density. Vascular: Nondiagnostic evaluation without contrast. Limited intracranial: Negative. Visualized orbits: Negative. Mastoids and visualized paranasal sinuses: Clear. Skeleton: No acute or aggressive process. Upper chest: Numerous nodular opacities throughout the visualized lung apices, suspicious for pneumonia and potentially aspiration. Large right pleural effusion. Other: Mild diffuse subcutaneous edema/stranding. No discrete fluid collection. Debris within the esophagus. IMPRESSION: 1. Numerous nodular opacities throughout the visualized lung apices, suspicious for pneumonia, aspiration, and/or septic emboli. Large right pleural effusion. Recommend follow-up CT chest to ensure resolution. 2. Debris within the esophagus, which places the patient at risk for aspiration. 3. Mild diffuse subcutaneous edema, possibly anasarca. No discrete fluid collection. 4. Enlarged and heterogeneous thyroid . Recommend thyroid  US  (ref: J Am Coll Radiol. 2015 Feb;12(2): 143-50). Electronically Signed   By: Stevenson Elbe M.D.   On: 12/31/2023 03:31   CT Maxillofacial Wo Contrast Result Date: 12/31/2023 EXAM: CT OF THE FACE WITHOUT CONTRAST 12/31/2023 02:48:52 AM TECHNIQUE: CT of the face was performed without the administration of intravenous contrast. Multiplanar reformatted images are provided for review. Automated exposure control, iterative reconstruction, and/or weight based adjustment of the mA/kV was utilized to reduce the radiation dose to as low as reasonably achievable. COMPARISON: None available. CLINICAL HISTORY: Facial trauma, blunt. t fell Monday face forward and was seen. Pt was discharged and her caregiver noticed a dark bruised under her neck when she got home. Pt has been struggling to swallow and spitting out her food.  Pt presents with blue discoloration in her face. FINDINGS: FACIAL BONES: No acute facial fracture. No mandibular dislocation. No suspicious bone lesion. ORBITS: Globes are intact. No acute traumatic injury. No inflammatory change. SINUSES AND MASTOIDS: No acute abnormality. SOFT TISSUES: Soft tissue irregularity/laceration overlying  the frontal bone. IMPRESSION: 1. Soft tissue irregularity/laceration overlying the frontal bone. 2. No maxillofacial fracture. Electronically signed by: Zadie Herter MD 12/31/2023 02:56 AM EDT RP Workstation: [id number]   DG Chest Port 1 View Result Date: 12/31/2023 CLINICAL DATA:  Shortness of breath. EXAM: PORTABLE CHEST 1 VIEW COMPARISON:  August 15, 2023 FINDINGS: The cardiac silhouette is mildly enlarged and unchanged in size. Low lung volumes are noted. Mild areas of atelectasis and/or infiltrate are seen within the bilateral lung bases. No pleural effusion or pneumothorax is identified. Multilevel degenerative changes seen throughout the thoracic spine. IMPRESSION: Stable cardiomegaly and low lung volumes with mild bibasilar atelectasis and/or infiltrate. Electronically Signed   By: Virgle Grime M.D.   On: 12/31/2023 01:13    Impression:   Shortness of breath with suspected pneumonia +/- aspiration on CT scan. Atrial fibrillation, on apixaban . Abnormal CT:  possible debris/food in esophagus.  Patient has had some pill dysphagia and some regurgitation over the past several weeks. No endoscopy for several years.  She does not have sialorrhea or vomiting, and has tolerated this morning water  and applesauce.  Plan:   I am not convinced patient has esophageal food impaction, but very well may have problem with esophageal dysmotility. Speech Therapy has seen patient and I appreciate their recommendations, especially regarding diet. PPI. Will stop Eliquis , starting Lovenox per hospitalists. If patient has no trouble with diet over the weekend, then would  prefer outpatient endoscopy once patient better recovered from her pneumonia.  If she has vomiting or intolerance to her diet, then we would need to consider inpatient endoscopy. At present time, with her pneumonia and dyspnea, I would rather avoid endoscopy for the next few days unless absolutely critically needed. [facility] will revisit tomorrow; if    LOS: 1 day   Claudeen Leason M  01/01/2024, 9:38 AM  Cell [phone] If no answer or after 5 PM call [phone]

## 2024-01-01 NOTE — Consult Note (Signed)
 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 She was found to have af w rvr, aspiration pneumonia vs pneumonitis on ct and decompensated heart failure. She was started on abx and admitted to medicine floor. She had subsequent imrpovement in her respiratory status and was undergoing work up for dysphagia. Her blood culture came back positive as enteroccuss [name] with unclear source.  On 6/20, patient had a fever and soft blood pressure for which she was transferred to icu. She was initially doing well, however she had worsening hypotension upon falling asleep needing 1mcg/min of levophed peripherally.  On my assessment, patient is resting comfortably, laying flat. She is easily arousable and mentating. She denies any discomfort, difficulty breathing or any pain.  Pertinent  Medical History  As above.  Significant Hospital Events: Including procedures, antibiotic start and stop dates in addition to other pertinent events   6/19 admitted to hospital medicine, needed bipap transiently which was weaned off. Received abx, lasix  40 with improvement.  6/20 had soft blood pressures requiring holding lasix  and albumin. Started on 1mcg/min  Interim History / Subjective:  Negative except above.   Objective    Blood pressure (!) 88/59, pulse (!) 119, temperature 98.8 F (37.1 C), resp. rate (!) 28, height 5' 2 (1.575 m), weight 49.4 kg, SpO2 97%.        Intake/Output Summary (Last 24 hours) at 01/01/2024 2330 Last data filed at 01/01/2024 1800 Gross per 24 hour  Intake 118.77 ml   Output 850 ml  Net -731.23 ml   Filed Weights   12/31/23 0426  Weight: 49.4 kg    Examination: General: awakens on voice,  HENT: eomi, mmm Jvp to level of jaw while laying flat. Lungs: craackles Cardiovascular: irregular, tachycardic Abdomen: soft, nt, nd Extremities: no c/c/e Neuro: non focal   Resolved problem list   Assessment and Plan  Severe sepsis: likely 2/2 enterococcus hirae bacteremia, source unknown. Procal 0.26, white count stable. Lactic acid is flucutating. --continue zosyn pending speciation, if persistent hypotension recommend adding vancomycin   --would consult ID as this is an unusual organism and source is unknown. --obtain RUQ as gall bladder is potentially a site for infection with this organism.  F/u urine culture. --repeat semi upright chest xr in am to assess for persistent effusion --holding off on thoracentesis as bilateral pleural effusions possibly 2/2 decompensated heart failure, if persistent hypotension can consider at that point.  --will give albumin given hypotension --levophed for MAP 60-65 given frail elderly patient and to avoid worsening atrial fibrillation  Acute on chronic decompensated systolic and diastolic heart failure.  --creatinine, sodium and respiratory status have improved with diuresis.  --avoid further iv fluids or lasix , can give prn albumin for now.  --rate control per cardiology given diastolic dysfunction --send tsh, free t4, total t3  Atrial fibrillation. --rate control per cardiology --anticoagulation per cardiology   Best Practice (right click and Reselect all SmartList Selections daily)   Diet/type: dysphagia diet (see orders) DVT prophylaxis systemic heparin  Pressure ulcer(s): N/A GI prophylaxis: N/A Lines: N/A Foley:  N/A Code Status:  full code Last  date of multidisciplinary goals of care discussion []   Labs   CBC: Recent Labs  Lab 12/28/23 1352 12/31/23 0108 12/31/23 2309 01/01/24 0828  WBC  7.2 8.0 11.5* 11.6*  NEUTROABS  --  5.4  --  10.5*  HGB 12.7 13.4 12.0 12.5  HCT 39.7 44.2 38.8 40.4  MCV 91.1 94.4 92.2 91.2  PLT 263 262 183 179    Basic Metabolic Panel: Recent Labs  Lab 12/31/23 0108 12/31/23 1009 12/31/23 1552 12/31/23 2309 01/01/24 0828  NA 124* 131* 130* 131* 133*  K 5.1 5.2* 5.2* 4.6 4.6  CL 91* 99 95* 96* 96*  CO2 20* 20* 25 26 25   GLUCOSE 140* 63* 74 83 72  BUN 31* 31* 31* 31* 29*  CREATININE 1.71* 1.60* 1.65* 1.61* 1.45*  CALCIUM 8.7* 9.4 8.8* 8.8* 9.0  MG  --   --   --   --  1.9   GFR: Estimated Creatinine Clearance: 26.5 mL/min (A) (by C-G formula based on SCr of 1.45 mg/dL (H)). Recent Labs  Lab 12/28/23 1352 12/31/23 0108 12/31/23 0209 12/31/23 0411 12/31/23 0413 12/31/23 2309 01/01/24 0828 01/01/24 1902  PROCALCITON  --   --   --   --   --   --   --  0.26  WBC 7.2 8.0  --   --   --  11.5* 11.6*  --   LATICACIDVEN  --   --  3.2* 1.9 1.5  --   --  1.9    Liver Function Tests: Recent Labs  Lab 01/01/24 0828  AST 43*  ALT 36  ALKPHOS 174*  BILITOT 2.1*  PROT 6.3*  ALBUMIN 2.4*   No results for input(s): LIPASE, AMYLASE in the last 168 hours. No results for input(s): AMMONIA in the last 168 hours.  ABG    Component Value Date/Time   PHART 7.37 12/31/2023 0430   PCO2ART 39 12/31/2023 0430   PO2ART 168 (H) 12/31/2023 0430   HCO3 23.2 12/31/2023 0430   ACIDBASEDEF 2.4 (H) 12/31/2023 0430   O2SAT 99.2 12/31/2023 0430     Coagulation Profile: No results for input(s): INR, PROTIME in the last 168 hours.  Cardiac Enzymes: No results for input(s): CKTOTAL, CKMB, CKMBINDEX, TROPONINI in the last 168 hours.  HbA1C: Hgb A1c MFr Bld  Date/Time Value Ref Range Status  08/16/2023 05:53 AM 6.1 (H) 4.8 - 5.6 % Final    Comment:    (NOTE) Pre diabetes:          5.7%-6.4%  Diabetes:              >6.4%  Glycemic control for   <7.0% adults with diabetes   03/12/2023 03:25 AM 6.1 (H) 4.8 - 5.6 % Final     Comment:    (NOTE) Pre diabetes:          5.7%-6.4%  Diabetes:              >6.4%  Glycemic control for   <7.0% adults with diabetes     CBG: No results for input(s): GLUCAP in the last 168 hours.  Review of Systems:   12 point ROS negative except above.   Past Medical History:  She,  has a past medical history of Anxiety, Atrial fibrillation (HCC), Family history of adverse reaction to anesthesia, Hip fracture (HCC) (01/18/2022), Hypertension, Rheumatoid arthritis(714.0), and Subclinical hyperthyroidism.   Surgical History:   Past Surgical History:  Procedure Laterality Date   Arm surgery     fracture,  right arm   CARDIOVERSION N/A 08/10/2014   Procedure: CARDIOVERSION;  Surgeon: Liza Riggers, MD;  Location: [facility] ENDOSCOPY;  Service: Cardiovascular;  Laterality: N/A;   CARDIOVERSION N/A 01/22/2022   Procedure: CARDIOVERSION;  Surgeon: Sonny Dust, MD;  Location: [facility] ENDOSCOPY;  Service: Cardiovascular;  Laterality: N/A;   CATARACT EXTRACTION     DIRECT LARYNGOSCOPY N/A 09/02/2019   Procedure: DIRECT LARYNGOSCOPY WITH BIOPSY;  Surgeon: Prescott Brodie, MD;  Location:  SURGERY CENTER;  Service: ENT;  Laterality: N/A;   HIP ARTHROPLASTY Right 01/19/2022   Procedure: ARTHROPLASTY BIPOLAR HIP (HEMIARTHROPLASTY);  Surgeon: Osa Blase, MD;  Location: [facility] ORS;  Service: Orthopedics;  Laterality: Right;   IR GASTROSTOMY TUBE MOD SED  10/17/2019   IR GASTROSTOMY TUBE REMOVAL  01/03/2020   TEE WITHOUT CARDIOVERSION N/A 01/22/2022   Procedure: TRANSESOPHAGEAL ECHOCARDIOGRAM (TEE);  Surgeon: Sonny Dust, MD;  Location: [facility] ENDOSCOPY;  Service: Cardiovascular;  Laterality: N/A;   TUBAL LIGATION       Social History:   reports that she quit smoking about 4 years ago. Her smoking use included cigarettes. She started smoking about 58 years ago. She has a 13.6 pack-year smoking history. She has been exposed to tobacco smoke. She has never used smokeless  tobacco. She reports that she does not currently use alcohol. She reports that she does not use drugs.   Family History:  Her family history includes CAD (age of onset: 63) in her brother; Cancer in her brother, father, and sister; Diabetes in her brother; Heart attack (age of onset: 7) in her mother; Hypothyroidism in her mother and sister.   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 Authorizing Provider  acetaminophen  (TYLENOL ) 500 MG tablet Take 500 mg by mouth as needed for moderate pain.   Yes [provider]  ALPRAZolam  (XANAX ) 1 MG tablet Take 1 mg by mouth 4 (four) times daily as needed for anxiety.   Yes [provider]  amiodarone  (PACERONE ) 200 MG tablet Take 0.5 tablets (100 mg total) by mouth daily. 10/23/23  Yes Eilleen Grates, MD  diclofenac Sodium (VOLTAREN) 1 % GEL Apply 2 g topically 4 (four) times daily as needed (pain).   Yes [provider]  docusate sodium  (COLACE) 100 MG capsule Take 1 capsule (100 mg total) by mouth 2 (two) times daily as needed for mild constipation. 04/02/23  Yes Sheikh, Omair Latif, DO  ELIQUIS  5 MG TABS tablet Take 1 tablet (5 mg total) by mouth 2 (two) times daily. 12/15/23  Yes Eilleen Grates, MD  furosemide  (LASIX ) 20 MG tablet Take 1 tablet (20 mg total) by mouth as needed. Patient taking differently: Take 20 mg by mouth as needed for fluid or edema. 10/06/23 01/04/24 Yes Meng, Hao, PA  loperamide  (IMODIUM  A-D) 2 MG tablet Take 2 mg by mouth 4 (four) times daily as needed for diarrhea or loose stools.   Yes [provider]  methimazole  (TAPAZOLE ) 5 MG tablet Take 0.5 tablets (2.5 mg total) by mouth daily. 11/20/23  Yes Thapa, [place], MD  metoprolol  succinate (TOPROL -XL) 100 MG 24 hr tablet Take  100 mg by mouth daily. Take with or immediately following a meal.   Yes [provider]  Multiple Vitamin (MULTIVITAMIN WITH MINERALS) TABS  tablet Take 1 tablet by mouth daily.    Yes [provider]  oxyCODONE -acetaminophen  (PERCOCET) 10-325 MG tablet Take 1 tablet by mouth every 4 (four) hours as needed for pain. 01/24/22  Yes Brown, Blaine K, PA-C  sodium chloride  1 g tablet Take 1 tablet (1 g total) by mouth as needed. Patient taking differently: Take 1 g by mouth as needed (low sodium). 10/06/23  Yes Meng, Hao, PA  Vitamin D , Ergocalciferol , (DRISDOL) 1.25 MG (50000 UNIT) CAPS capsule Take 50,000 Units by mouth once a week. 12/28/21  Yes [provider]  doxycycline (VIBRAMYCIN) 100 MG capsule Take 1 capsule (100 mg total) by mouth 2 (two) times daily for 7 days. Patient not taking: Reported on 12/31/2023 12/28/23 01/04/24  Royann Cords, PA     Critical care time: 44 minutes

## 2024-01-01 NOTE — Progress Notes (Addendum)
 eLink Physician-Brief Progress Note Patient Name: Rhonda Marshall DOB: 12/19/1950 MRN: [id number]   Date of Service  01/01/2024  HPI/Events of Note  eICU Brief new admit note: 40 F admitted on 19th for AHRF from multifocal pneumonia, low EF at 35% CHF exacerbation, effusion . AKI on CKD. Hx of severe LAD/a fib on eliquis , HTN, RA.  Blood cx growing GPC. Esophageal debris, GI consulted. No EGD for now.   Shifted to ICU for soft MAP for possible needing pressors.   Data: Reviewed  EKG: reviewed, afib with variable block, qtc 481.  BNP >4,500 - Creatinine 1.45 improving. Hyperkalemia resolved. Hyponatremia stable.  - Lactic Acid 3.2>1.9>1.5 - WBC 8.0 - Hemoglobin 13.4 - Blood cultures grew Enterococcus [name] Procal normal.  LA 1.9 UA neg  CT chest: CT soft tissue neck showed numerous nodular opacities throughout the visualized lung apices, suspicious for pneumonia, aspiration, and/or septic emboli. Also noted large right pleural effusion, debris within the esophagus placing patient at risk for aspiration, mild diffuse subcutaneous edema.   Camera: On nasal o2 resting, RR is ok.  Sats 100%, MAP 75, afebrile.HR 104.   eICU Interventions  - VTE : lovenox . Eliquis  on hold - to go on pressors for diuresis as needed for CHF - continue antibiotics.  - aspiration precautions - CBG goals < 180. - salt and fluid restrictions.  Ground team is aware of admit, going to see her soon.       Intervention Category Major Interventions: Sepsis - evaluation and management;Hypotension - evaluation and management;Respiratory failure - evaluation and management Minor Interventions: Communication with other healthcare providers and/or family Evaluation Type: New Patient Evaluation  Jodelle ONEIDA Hutching 01/01/2024, 9:20 PM  00:26 LA > 2. CCM doc secure chat updates on 1 of levo, her lactic is up to 2.8 from 1.9. giving her 25 albumin . no additional fluids if pressor requirement goes up may  need to add vanc. have ordered a ruq and cxr for morning - continue above plan of care.  04:02 LA started trending down. Continue care, keep MAP > 65.

## 2024-01-02 ENCOUNTER — Inpatient Hospital Stay (HOSPITAL_COMMUNITY)

## 2024-01-02 DIAGNOSIS — I4891 Unspecified atrial fibrillation: Secondary | ICD-10-CM

## 2024-01-02 DIAGNOSIS — E871 Hypo-osmolality and hyponatremia: Secondary | ICD-10-CM | POA: Diagnosis not present

## 2024-01-02 DIAGNOSIS — B952 Enterococcus as the cause of diseases classified elsewhere: Secondary | ICD-10-CM | POA: Diagnosis not present

## 2024-01-02 DIAGNOSIS — I5043 Acute on chronic combined systolic (congestive) and diastolic (congestive) heart failure: Secondary | ICD-10-CM

## 2024-01-02 DIAGNOSIS — A419 Sepsis, unspecified organism: Secondary | ICD-10-CM

## 2024-01-02 DIAGNOSIS — I5022 Chronic systolic (congestive) heart failure: Secondary | ICD-10-CM

## 2024-01-02 DIAGNOSIS — J690 Pneumonitis due to inhalation of food and vomit: Secondary | ICD-10-CM | POA: Diagnosis not present

## 2024-01-02 DIAGNOSIS — I4821 Permanent atrial fibrillation: Secondary | ICD-10-CM

## 2024-01-02 DIAGNOSIS — R6521 Severe sepsis with septic shock: Secondary | ICD-10-CM | POA: Diagnosis not present

## 2024-01-02 DIAGNOSIS — R7881 Bacteremia: Secondary | ICD-10-CM

## 2024-01-02 DIAGNOSIS — J9601 Acute respiratory failure with hypoxia: Secondary | ICD-10-CM | POA: Diagnosis not present

## 2024-01-02 DIAGNOSIS — J189 Pneumonia, unspecified organism: Secondary | ICD-10-CM | POA: Diagnosis not present

## 2024-01-02 LAB — CULTURE, BLOOD (ROUTINE X 2)

## 2024-01-02 LAB — URINE CULTURE: Culture: NO GROWTH

## 2024-01-02 LAB — COMPREHENSIVE METABOLIC PANEL WITH GFR
ALT: 26 U/L (ref 0–44)
AST: 33 U/L (ref 15–41)
Albumin: 3 g/dL — ABNORMAL LOW (ref 3.5–5.0)
Alkaline Phosphatase: 152 U/L — ABNORMAL HIGH (ref 38–126)
Anion gap: 12 (ref 5–15)
BUN: 27 mg/dL — ABNORMAL HIGH (ref 8–23)
CO2: 26 mmol/L (ref 22–32)
Calcium: 8.8 mg/dL — ABNORMAL LOW (ref 8.9–10.3)
Chloride: 93 mmol/L — ABNORMAL LOW (ref 98–111)
Creatinine, Ser: 1.27 mg/dL — ABNORMAL HIGH (ref 0.44–1.00)
GFR, Estimated: 44 mL/min — ABNORMAL LOW (ref >60)
Glucose, Bld: 157 mg/dL — ABNORMAL HIGH (ref 70–99)
Potassium: 3.5 mmol/L (ref 3.5–5.1)
Sodium: 131 mmol/L — ABNORMAL LOW (ref 135–145)
Total Bilirubin: 1.7 mg/dL — ABNORMAL HIGH (ref 0.0–1.2)
Total Protein: 6.1 g/dL — ABNORMAL LOW (ref 6.5–8.1)

## 2024-01-02 LAB — CBC WITH DIFFERENTIAL/PLATELET
Abs Immature Granulocytes: 0.03 10*3/uL (ref 0.00–0.07)
Basophils Absolute: 0 10*3/uL (ref 0.0–0.1)
Basophils Relative: 0 %
Eosinophils Absolute: 0.2 10*3/uL (ref 0.0–0.5)
Eosinophils Relative: 2 %
HCT: 34.2 % — ABNORMAL LOW (ref 36.0–46.0)
Hemoglobin: 10.6 g/dL — ABNORMAL LOW (ref 12.0–15.0)
Immature Granulocytes: 0 %
Lymphocytes Relative: 8 %
Lymphs Abs: 0.6 10*3/uL — ABNORMAL LOW (ref 0.7–4.0)
MCH: 29 pg (ref 26.0–34.0)
MCHC: 31 g/dL (ref 30.0–36.0)
MCV: 93.4 fL (ref 80.0–100.0)
Monocytes Absolute: 0.4 10*3/uL (ref 0.1–1.0)
Monocytes Relative: 5 %
Neutro Abs: 6.1 10*3/uL (ref 1.7–7.7)
Neutrophils Relative %: 85 %
Platelets: 166 10*3/uL (ref 150–400)
RBC: 3.66 MIL/uL — ABNORMAL LOW (ref 3.87–5.11)
RDW: 18.2 % — ABNORMAL HIGH (ref 11.5–15.5)
WBC: 7.3 10*3/uL (ref 4.0–10.5)
nRBC: 0 % (ref 0.0–0.2)

## 2024-01-02 LAB — LACTIC ACID, PLASMA: Lactic Acid, Venous: 2.3 mmol/L (ref 0.5–1.9)

## 2024-01-02 LAB — PHOSPHORUS: Phosphorus: 2.6 mg/dL (ref 2.5–4.6)

## 2024-01-02 LAB — PROCALCITONIN: Procalcitonin: 0.24 ng/mL

## 2024-01-02 LAB — T4, FREE: Free T4: 0.93 ng/dL (ref 0.61–1.12)

## 2024-01-02 LAB — MAGNESIUM: Magnesium: 1.7 mg/dL (ref 1.7–2.4)

## 2024-01-02 LAB — TSH: TSH: 2.939 u[IU]/mL (ref 0.350–4.500)

## 2024-01-02 LAB — CORTISOL: Cortisol, Plasma: 11.4 ug/dL

## 2024-01-02 MED ORDER — ENSURE PLUS HIGH PROTEIN PO LIQD
237.0000 mL | Freq: Two times a day (BID) | ORAL | Status: DC
  Administered 2024-01-02 – 2024-01-08 (×8): 237 mL via ORAL

## 2024-01-02 NOTE — Progress Notes (Signed)
 Progress Note  Patient Name: Rhonda Marshall Date of Encounter: 01/02/2024  Primary Cardiologist: Lynwood Schilling, MD   Subjective   No chest pain or sob.   Inpatient Medications    Scheduled Meds:  amiodarone   100 mg Oral Daily   Chlorhexidine  Gluconate Cloth  6 each Topical Daily   enoxaparin  (LOVENOX ) injection  50 mg Subcutaneous Q24H   feeding supplement  237 mL Oral BID BM   leptospermum manuka honey  1 Application Topical Daily   methimazole   2.5 mg Oral Daily   metoprolol  tartrate  5 mg Intravenous Q6H   Continuous Infusions:  sodium chloride  10 mL/hr at 01/02/24 0718   norepinephrine  (LEVOPHED ) Adult infusion Stopped (01/02/24 0223)   piperacillin -tazobactam (ZOSYN )  IV 3.375 g (01/02/24 0753)   PRN Meds: acetaminophen  **OR** acetaminophen , albuterol , ALPRAZolam , diclofenac  Sodium, ondansetron  **OR** ondansetron  (ZOFRAN ) IV, traZODone    Vital Signs    Vitals:   01/02/24 0715 01/02/24 0719 01/02/24 0800 01/02/24 0900  BP:   107/83 119/87  Pulse: 97 (!) 104 (!) 106 92  Resp: (!) 24 (!) 26 (!) 26 (!) 27  Temp: 98.4 F (36.9 C) (!) 85.5 F (29.7 C) 99 F (37.2 C)   TempSrc:      SpO2: 99% (!) 83% 99% 98%  Weight:      Height:        Intake/Output Summary (Last 24 hours) at 01/02/2024 1118 Last data filed at 01/02/2024 1023 Gross per 24 hour  Intake 499.27 ml  Output 1450 ml  Net -950.73 ml   Filed Weights   12/31/23 0426  Weight: 49.4 kg    Telemetry    Afib with a CVR of 100/min - Personally Reviewed  ECG    none - Personally Reviewed  Physical Exam   GEN: No acute distress.   Neck: No JVD Cardiac: IRIRR, no murmurs, rubs, or gallops.  Respiratory: Clear to auscultation bilaterally. GI: Soft, nontender, non-distended  MS: No edema; No deformity. Neuro:  Nonfocal  Psych: Normal affect   Labs    Chemistry Recent Labs  Lab 12/31/23 2309 01/01/24 0828 01/02/24 0308  NA 131* 133* 131*  K 4.6 4.6 3.5  CL 96* 96* 93*  CO2 26 25  26   GLUCOSE 83 72 157*  BUN 31* 29* 27*  CREATININE 1.61* 1.45* 1.27*  CALCIUM 8.8* 9.0 8.8*  PROT  --  6.3* 6.1*  ALBUMIN   --  2.4* 3.0*  AST  --  43* 33  ALT  --  36 26  ALKPHOS  --  174* 152*  BILITOT  --  2.1* 1.7*  GFRNONAA 33* 38* 44*  ANIONGAP 9 12 12      Hematology Recent Labs  Lab 12/31/23 2309 01/01/24 0828 01/02/24 0308  WBC 11.5* 11.6* 7.3  RBC 4.21 4.43 3.66*  HGB 12.0 12.5 10.6*  HCT 38.8 40.4 34.2*  MCV 92.2 91.2 93.4  MCH 28.5 28.2 29.0  MCHC 30.9 30.9 31.0  RDW 18.5* 18.6* 18.2*  PLT 183 179 166    Cardiac EnzymesNo results for input(s): TROPONINI in the last 168 hours. No results for input(s): TROPIPOC in the last 168 hours.   BNP Recent Labs  Lab 12/31/23 0108  BNP >4,500.0*     DDimer No results for input(s): DDIMER in the last 168 hours.   Radiology    DG Chest 1 View Result Date: 01/02/2024 CLINICAL DATA:  Severe sepsis EXAM: CHEST  1 VIEW COMPARISON:  X-ray and CT scan 12/31/2023. FINDINGS: Pleural effusions  are again seen. Enlarged cardiopericardial silhouette with calcified aorta. Vascular congestion interstitial edema identified. No consolidation pneumothorax. Kyphotic x-ray obscures the apices more left than right as there is some leftward rotation of the x-ray. Overlapping cardiac leads. IMPRESSION: Enlarged heart with vascular congestion and edema. Right greater than left small pleural effusions Electronically Signed   By: Ranell Bring M.D.   On: 01/02/2024 10:04   US  Abdomen Limited RUQ (LIVER/GB) Result Date: 01/02/2024 CLINICAL DATA:  Severe sepsis EXAM: ULTRASOUND ABDOMEN LIMITED RIGHT UPPER QUADRANT COMPARISON:  Ultrasound 03/10/2023 limited.  CT 12/31/2023. FINDINGS: Gallbladder: Distended gallbladder. There is some wall thickening and wall edema. No shadowing stones or reported Murphy's sign. Common bile duct: Diameter: 3 mm Liver: No focal lesion identified. Within normal limits in parenchymal echogenicity. Portal vein is  patent on color Doppler imaging with normal direction of blood flow towards the liver. Other: Scattered ascites.  Pleural effusion. IMPRESSION: Sick gallbladder with some wall thickening and wall edema but no stones. The wall edema and thickening is nonspecific in the presence of ascites. No biliary ductal dilatation. Pleural effusion. Electronically Signed   By: Ranell Bring M.D.   On: 01/02/2024 10:00   ECHOCARDIOGRAM COMPLETE Result Date: 01/01/2024    ECHOCARDIOGRAM REPORT   Patient Name:   Rhonda Marshall Date of Exam: 01/01/2024 Medical Rec #:  [id number]        Height:       62.0 in Accession #:    [id number]       Weight:       109.0 lb Date of Birth:  [birth year]        BSA:          1.477 m Patient Age:    73 years         BP:           129/00 mmHg Patient Gender: F                HR:           85 bpm. Exam Location:  Inpatient Procedure: 2D Echo, Color Doppler and Cardiac Doppler (Both Spectral and Color            Flow Doppler were utilized during procedure). Indications:    I50.21 Acute systolic (congestive) heart failure  History:        Patient has prior history of Echocardiogram examinations, most                 recent 03/30/2023.  Sonographer:    Eva Lash Referring Phys: [phone] DANIEL V THOMPSON IMPRESSIONS  1. Left ventricular ejection fraction, by estimation, is 30 to 35%. The left ventricle has moderately decreased function. The left ventricle demonstrates global hypokinesis. The left ventricular internal cavity size was moderately dilated. Left ventricular diastolic parameters are consistent with Grade II diastolic dysfunction (pseudonormalization). Elevated left ventricular end-diastolic pressure.  2. Right ventricular systolic function is normal. The right ventricular size is normal.  3. Left atrial size was moderately dilated.  4. Right atrial size was moderately dilated.  5. The mitral valve is abnormal. Moderate mitral valve regurgitation. No evidence of mitral stenosis.  6. Tricuspid  valve regurgitation is moderate.  7. The aortic valve is normal in structure. There is moderate calcification of the aortic valve. There is moderate thickening of the aortic valve. Aortic valve regurgitation is not visualized. Aortic valve sclerosis is present, with no evidence of aortic valve stenosis.  8. The inferior vena cava is dilated in  size with >50% respiratory variability, suggesting right atrial pressure of 8 mmHg. FINDINGS  Left Ventricle: Left ventricular ejection fraction, by estimation, is 30 to 35%. The left ventricle has moderately decreased function. The left ventricle demonstrates global hypokinesis. Definity  contrast agent was given IV to delineate the left ventricular endocardial borders. Strain was performed and the global longitudinal strain is indeterminate. The left ventricular internal cavity size was moderately dilated. There is no left ventricular hypertrophy. Left ventricular diastolic parameters are consistent with Grade II diastolic dysfunction (pseudonormalization). Elevated left ventricular end-diastolic pressure. Right Ventricle: The right ventricular size is normal. No increase in right ventricular wall thickness. Right ventricular systolic function is normal. Left Atrium: Left atrial size was moderately dilated. Right Atrium: Right atrial size was moderately dilated. Pericardium: There is no evidence of pericardial effusion. Mitral Valve: The mitral valve is abnormal. There is mild thickening of the mitral valve leaflet(s). Moderate mitral valve regurgitation. No evidence of mitral valve stenosis. Tricuspid Valve: The tricuspid valve is normal in structure. Tricuspid valve regurgitation is moderate . No evidence of tricuspid stenosis. Aortic Valve: The aortic valve is normal in structure. There is moderate calcification of the aortic valve. There is moderate thickening of the aortic valve. Aortic valve regurgitation is not visualized. Aortic valve sclerosis is present, with no  evidence of aortic valve stenosis. Aortic valve mean gradient measures 4.0 mmHg. Aortic valve peak gradient measures 7.2 mmHg. Aortic valve area, by VTI measures 1.81 cm. Pulmonic Valve: The pulmonic valve was normal in structure. Pulmonic valve regurgitation is mild. No evidence of pulmonic stenosis. Aorta: The aortic root is normal in size and structure. Venous: The inferior vena cava is dilated in size with greater than 50% respiratory variability, suggesting right atrial pressure of 8 mmHg. IAS/Shunts: No atrial level shunt detected by color flow Doppler. Additional Comments: 3D was performed not requiring image post processing on an independent workstation and was indeterminate.  LEFT VENTRICLE PLAX 2D LVIDd:         4.50 cm      Diastology LVIDs:         3.90 cm      LV e' lateral:   6.64 cm/s LV PW:         1.10 cm      LV E/e' lateral: 18.7 LV IVS:        0.90 cm LVOT diam:     2.00 cm LV SV:         33 LV SV Index:   23 LVOT Area:     3.14 cm  LV Volumes (MOD) LV vol d, MOD A2C: 157.0 ml LV vol d, MOD A4C: 148.0 ml LV vol s, MOD A2C: 99.7 ml LV vol s, MOD A4C: 104.0 ml LV SV MOD A2C:     57.3 ml LV SV MOD A4C:     148.0 ml LV SV MOD BP:      50.6 ml RIGHT VENTRICLE TAPSE (M-mode): 1.0 cm LEFT ATRIUM             Index LA diam:        4.40 cm 2.98 cm/m LA Vol (A2C):   82.0 ml 55.52 ml/m LA Vol (A4C):   70.2 ml 47.53 ml/m LA Biplane Vol: 76.6 ml 51.86 ml/m  AORTIC VALVE AV Area (Vmax):    2.03 cm AV Area (Vmean):   1.74 cm AV Area (VTI):     1.81 cm AV Vmax:  134.00 cm/s AV Vmean:          89.800 cm/s AV VTI:            0.184 m AV Peak Grad:      7.2 mmHg AV Mean Grad:      4.0 mmHg LVOT Vmax:         86.60 cm/s LVOT Vmean:        49.800 cm/s LVOT VTI:          0.106 m LVOT/AV VTI ratio: 0.58  AORTA Ao Asc diam: 3.50 cm MITRAL VALVE                TRICUSPID VALVE MV Area (PHT): 5.95 cm     TR Peak grad:   36.5 mmHg MR Peak grad: 92.9 mmHg     TR Vmax:        302.00 cm/s MR Mean grad: 56.0  mmHg MR Vmax:      482.00 cm/s   SHUNTS MR Vmean:     349.0 cm/s    Systemic VTI:  0.11 m MV E velocity: 124.00 cm/s  Systemic Diam: 2.00 cm MV A velocity: 39.20 cm/s MV E/A ratio:  3.16 Maude Emmer MD Electronically signed by Maude Emmer MD Signature Date/Time: 01/01/2024/2:35:36 PM    Final     Cardiac Studies   See above  Patient Profile     73 y.o. female admitted with acute on chronic respiratory failure and found to have enterococcal sepsis  Assessment & Plan    Sepsis - Enterococcal bacteremia noted. TEE might not change her treatment as she not likely a candidate for Valve surgery with her multiple comorbidities. Chronic systolic heart failure - her EF and echo results are noted. Uptitrate diuretic as her bp allows.  Chronic atrial fib - her rates are reasonably well controlled. Continue current meds.     For questions or updates, please contact [facility] Please consult [email or web address] for contact info under Cardiology/STEMI.      Signed, Danelle Birmingham, MD  01/02/2024, 11:18 AM

## 2024-01-02 NOTE — Progress Notes (Signed)
 Tentative plans for TEE for bacteremia 01/04/2024.  Orders placed.  Consent will be provided on today's progress note.  Reason show n.p.o. midnight on the 23rd.

## 2024-01-02 NOTE — Progress Notes (Addendum)
 PROGRESS NOTE    Rhonda Marshall  FMW:[id number] DOB: 1950-08-09 DOA: 12/31/2023 PCP: [name] LABOR, MD    Chief Complaint  Patient presents with   Dysphagia    Brief Narrative: Patient is a 73 year old female history of chronic A-fib on Eliquis , hypertension, rheumatoid arthritis, hypothyroidism, HFrEF who admitted to the hospital with volume overload secondary to acute on chronic CHF exacerbation, severe sepsis due to multifocal pneumonia and concern for aspiration pneumonia.  Patient placed on IV antibiotics.  Patient received a dose of IV Lasix  however due to hypotension/soft blood pressure diuretics held.  Cardiology consulted.  GI also consulted due to concerns for debris in the esophagus.   Assessment & Plan:   Principal Problem:   Severe sepsis with septic shock (CODE) (HCC) Active Problems:   Acute on chronic systolic CHF (congestive heart failure) (HCC)   Sepsis due to pneumonia (HCC)   Bacteremia   Hyperthyroidism   Hypotension   AKI (acute kidney injury) (HCC)   Hyponatremia   Acute respiratory failure with hypoxia (HCC)   CKD (chronic kidney disease) stage 3, GFR 30-59 ml/min (HCC)   Dysphagia   Aspiration pneumonia of both lungs (HCC)   Bilateral pleural effusion   Lactic acidosis   Permanent atrial fibrillation (HCC)  #1 severe sepsis likely secondary to multifocal pneumonia/aspiration pneumonia and Enterococcus [name] bacteremia with septic shock/hypotension - Patient on admission met criteria for sepsis with hypothermia, tachycardia, initial lactate at 3.2 trending down with source of sepsis being multifocal/aspiration pneumonia and bacteremia. - CT neck and CT chest done concerning for debris in the esophagus as well as diffuse bilateral bronchopneumonia with bilateral lower lobe consolidation, moderate right and small left layering pleural effusions with simple fluid density favoring transudate.  Body wall anasarca noted. -.  2/4 blood cultures with  Enterococcus hirae. BCID negative. -MRSA PCR negative. - SLP following evaluated patient and patient on dysphagia 1 diet. - Continue empiric IV Zosyn . - Patient with persistent hypotension, patient in acute CHF exacerbation and as such difficulty diuresing and unable to give fluid boluses. -Repeat lactic acid level elevated but trending down, procalcitonin at 0.26 and 0.24. -Due to persistent hypotension patient placed on Levophed  drip on 01/01/2024 which is subsequently being weaned off with improvement with blood pressure. -Repeat chest x-ray and right upper quadrant ultrasound ordered to rule out source of bacteremia. -Patient seen in consultation by ID who are recommending TEE. -PCCM was consulted and following but have signed off as of 01/02/2024. -ID following. - Will start patient on Levophed  drip and consult with PCCM for further evaluation and management.  2.  Acute respiratory failure with hypoxia - Mild likely secondary to multifocal pneumonia as well as acute on chronic CHF exacerbation in the setting of associated pleural effusion and volume overload. - Continue treatment for pneumonia with IV antibiotics as well as heart failure. - Limited use of diuretics due to soft/low blood pressure. -Status post Levophed  which was started on 01/01/2024 with improvement with blood pressure. -Levophed  has been weaned off. -Will defer further initiation of IV diuretics to cardiology. - BiPAP as needed.  3.  Lactic acidosis -Likely secondary to problem #1. - See problem #1.  4.  Acute on chronic HFrEF -2D echo done with a EF of 30 to 35%, global hypokinesis left ventricle, grade 2 DD, moderately dilated left atrial size, moderately dilated right atrial size, moderate MVR. - Patient noted to have received a dose of Lasix  20 mg IV x 1 on admission with urine  output not recorded initially. -Patient with urine output of 1.450 L over the past 24 hours. -Patient seems to be auto diuresing as due  to soft/low blood pressure patient unable to be placed on IV Lasix . -Patient placed on pressors yesterday 01/01/2024 with improvement with blood pressure, pressors discontinued this morning. - BP soft/low and as such unable to diurese at this time. - Due to complicated cardiac history, soft/low blood pressure with difficulty diuresing in the setting of acute CHF exacerbation cardiology was consulted and are following.   - Will defer further initiation of IV diuretics to cardiology.  5.  Chronic hyponatremia - Patient noted to have previously been on salt tablets which was discontinued as patient was not taking diuretics of Lasix  on a daily basis. - Current course of hyponatremia likely secondary to hypervolemic hyponatremia. - Due to soft/low blood pressure unable to place on IV diuretics at this time. - Hyponatremia improved since admission after receiving a dose of IV Lasix . - Continue to hold salt tablets. - Follow.  6.  Debris in esophagus/dysphagia -Noted on CT soft tissue neck and CT chest. - Patient presented with severe sepsis with concerns for bilateral pneumonia/aspiration pneumonia. - Concern as to whether patient may actually be able to tolerate oral pills and whether they are being absorbed due to debris noted in the esophagus. - GI consulted Home at this time does not feel patient has esophageal food impaction but likely may have a esophageal dysmotility issue. - SLP following. - Eliquis  discontinued patient placed on Lovenox  for now pending possible procedure in house. - GI recommending trial of diet recommended by SLP and if patient tolerates diet over the weekend  recommend an outpatient endoscopy once patient has recovered from pneumonia. -Patient tolerating current dysphagia 1 diet and per GI advance as recommended by SLP. - Per GI if patient vomits or unable to tolerate diet then will need to consider inpatient endoscopy at this time. - GI following and appreciate their  input and recommendations.  7.  Chronic A-fib -Currently rate controlled on IV Lopressor . - Continue amiodarone . - Eliquis  held and patient placed on full dose Lovenox  for now due to problem #6 and concern for possible in-house procedure. - Cardiology consulted and following.  8.  Hyperthyroidism -Continue home regimen Tapazole . - Continue IV Lopressor . - Outpatient follow-up with primary endocrinologist.  9.  AKI on CKD stage IIIa - Baseline creatinine approximately 1.2. - Likely secondary to acute CHF exacerbation in the setting of low blood pressure. - Patient received a dose of IV Lasix  with some improvement in renal function. -Patient with urine output of 1.450 L over the past 24 hours. -Renal function slowly improving. - Avoid nephrotoxins.  10.  Bilateral pleural effusion -Likely secondary to volume overload. - Unable to diurese due to soft/low blood pressure.   - Repeat chest x-ray done this morning with enlarged heart with vascular congestion and edema.  Right greater than left small pleural effusions.  - Patient assessed by PCCM who currently recommending holding off on thoracentesis as bilateral pleural effusions and ascites likely due to decompensated CHF. -- Will defer diuresis to cardiology.  11.  Hypotension/soft blood pressure -Patient with soft blood pressure/hypotension. - Likely secondary to problem #1. - Difficulty with diuresing due to soft blood pressure/hypotension. - Status post IV albumin  x 1.   - Cortisol level this morning at 11.4.  - Lactic acid level was elevated at 2.8 trending back down to 2.3 this morning.  - Procalcitonin at 0.26  and 0.24.  - Patient placed on Levophed  yesterday which has subsequently been weaned off early this morning.  - Continue empiric IV antibiotics.  -See problem #1.      DVT prophylaxis: Lovenox  Code Status: Full Family Communication: Updated patient. Updated daughter Jacqualynn Parco (primary caretaker) on the  telephone.   Disposition: TBD  Status is: Inpatient Remains inpatient appropriate because: Severity of illness   Consultants:  Cardiology: Dr. Michele 01/01/2024 Gastroenterology: Dr. Burnette 01/01/2024 PCCM: Dr.Joshi 01/01/2024 ID: Dr. Dennise 01/01/2024  Procedures:  CT soft tissue neck 12/31/2023 CT maxillofacial 12/31/2023 Chest x-ray 12/31/2023, 01/02/2024 2D echo 01/01/2024 CT chest abdomen and pelvis 12/31/2023 Right upper quadrant ultrasound pending  Antimicrobials:  Anti-infectives (From admission, onward)    Start     Dose/Rate Route Frequency Ordered Stop   12/31/23 0800  piperacillin -tazobactam (ZOSYN ) IVPB 3.375 g        3.375 g 12.5 mL/hr over 240 Minutes Intravenous Every 8 hours 12/31/23 0738     12/31/23 0145  piperacillin -tazobactam (ZOSYN ) IVPB 3.375 g        3.375 g 100 mL/hr over 30 Minutes Intravenous  Once 12/31/23 0143 12/31/23 0245         Subjective: Patient laying in bed eating an [place] ice.  States she is hungry.  Patient denies any significant shortness of breath or chest pain.  Denies any significant abdominal pain.  Noted on 2 to 3 L nasal cannula.  States she just saw the gastroenterologist.    Objective: Vitals:   01/02/24 0715 01/02/24 0719 01/02/24 0800 01/02/24 0900  BP:   107/83 119/87  Pulse: 97 (!) 104 (!) 106 92  Resp: (!) 24 (!) 26 (!) 26 (!) 27  Temp: 98.4 F (36.9 C) (!) 85.5 F (29.7 C) 99 F (37.2 C)   TempSrc:      SpO2: 99% (!) 83% 99% 98%  Weight:      Height:        Intake/Output Summary (Last 24 hours) at 01/02/2024 1045 Last data filed at 01/02/2024 1023 Gross per 24 hour  Intake 499.27 ml  Output 1450 ml  Net -950.73 ml   Filed Weights   12/31/23 0426  Weight: 49.4 kg    Examination:  General exam: NAD. Respiratory system: Diffuse crackles.  Some diffuse coarse breath sounds.  Some decreased breath sounds in the bases.  No wheezing.  Fair air movement.  On 2 to 3 L nasal cannula.    Cardiovascular system:  Irregularly irregular. No JVD, murmurs, rubs, gallops or clicks.  No lower extremity edema.  Gastrointestinal system: Abdomen is soft, nontender, nondistended, positive bowel sounds.  No rebound.  No guarding Central nervous system: Alert and oriented. No focal neurological deficits. Extremities: Symmetric 5 x 5 power. Skin: No rashes, lesions or ulcers Psychiatry: Judgement and insight appear normal. Mood & affect appropriate.     Data Reviewed: I have personally reviewed following labs and imaging studies  CBC: Recent Labs  Lab 12/28/23 1352 12/31/23 0108 12/31/23 2309 01/01/24 0828 01/02/24 0308  WBC 7.2 8.0 11.5* 11.6* 7.3  NEUTROABS  --  5.4  --  10.5* 6.1  HGB 12.7 13.4 12.0 12.5 10.6*  HCT 39.7 44.2 38.8 40.4 34.2*  MCV 91.1 94.4 92.2 91.2 93.4  PLT 263 262 183 179 166    Basic Metabolic Panel: Recent Labs  Lab 12/31/23 1009 12/31/23 1552 12/31/23 2309 01/01/24 0828 01/02/24 0308  NA 131* 130* 131* 133* 131*  K 5.2* 5.2* 4.6 4.6 3.5  CL 99 95* 96* 96* 93*  CO2 20* 25 26 25 26   GLUCOSE 63* 74 83 72 157*  BUN 31* 31* 31* 29* 27*  CREATININE 1.60* 1.65* 1.61* 1.45* 1.27*  CALCIUM 9.4 8.8* 8.8* 9.0 8.8*  MG  --   --   --  1.9 1.7  PHOS  --   --   --   --  2.6    GFR: Estimated Creatinine Clearance: 30.3 mL/min (A) (by C-G formula based on SCr of 1.27 mg/dL (H)).  Liver Function Tests: Recent Labs  Lab 01/01/24 0828 01/02/24 0308  AST 43* 33  ALT 36 26  ALKPHOS 174* 152*  BILITOT 2.1* 1.7*  PROT 6.3* 6.1*  ALBUMIN  2.4* 3.0*    CBG: No results for input(s): GLUCAP in the last 168 hours.   Recent Results (from the past 240 hours)  Blood culture (routine x 2)     Status: Abnormal (Preliminary result)   Collection Time: 12/31/23  2:00 AM   Specimen: BLOOD  Result Value Ref Range Status   Specimen Description   Final    BLOOD LEFT ANTECUBITAL Performed at [facility], [address], [place], [place] [address]    Special  Requests   Final    BOTTLES DRAWN AEROBIC AND ANAEROBIC Blood Culture results may not be optimal due to an inadequate volume of blood received in culture bottles Performed at [facility], [address], [place], [place] [address]    Culture  Setup Time   Final    GRAM POSITIVE COCCI IN PAIRS IN CHAINS IN BOTH AEROBIC AND ANAEROBIC BOTTLES CRITICAL RESULT CALLED TO, READ BACK BY AND VERIFIED WITH: MAYA EMERSON MILLET [id number] @ 2212 FH Performed at [facility], [address], [place], [place] [address]    Culture ENTEROCOCCUS [name] (A)  Final   Report Status PENDING  Incomplete   Organism ID, Bacteria ENTEROCOCCUS [name]  Final      Susceptibility   Enterococcus hirae - MIC*    AMPICILLIN <=2 SENSITIVE Sensitive     VANCOMYCIN  <=0.5 SENSITIVE Sensitive     GENTAMICIN SYNERGY SENSITIVE Sensitive     * ENTEROCOCCUS [name]  Blood Culture ID Panel (Reflexed)     Status: None   Collection Time: 12/31/23  2:00 AM  Result Value Ref Range Status   Enterococcus faecalis NOT DETECTED NOT DETECTED Final   Enterococcus Faecium NOT DETECTED NOT DETECTED Final   Listeria monocytogenes NOT DETECTED NOT DETECTED Final   Staphylococcus species NOT DETECTED NOT DETECTED Final   Staphylococcus aureus (BCID) NOT DETECTED NOT DETECTED Final   Staphylococcus epidermidis NOT DETECTED NOT DETECTED Final   Staphylococcus lugdunensis NOT DETECTED NOT DETECTED Final   Streptococcus species NOT DETECTED NOT DETECTED Final   Streptococcus agalactiae NOT DETECTED NOT DETECTED Final   Streptococcus pneumoniae NOT DETECTED NOT DETECTED Final   Streptococcus pyogenes NOT DETECTED NOT DETECTED Final   A.calcoaceticus-baumannii NOT DETECTED NOT DETECTED Final   Bacteroides fragilis NOT DETECTED NOT DETECTED Final   Enterobacterales NOT DETECTED NOT DETECTED Final   Enterobacter cloacae complex NOT DETECTED NOT DETECTED Final   Escherichia coli NOT DETECTED NOT DETECTED Final   Klebsiella  aerogenes NOT DETECTED NOT DETECTED Final   Klebsiella oxytoca NOT DETECTED NOT DETECTED Final   Klebsiella pneumoniae NOT DETECTED NOT DETECTED Final   Proteus species NOT DETECTED NOT DETECTED Final   Salmonella species NOT DETECTED NOT DETECTED Final   Serratia marcescens NOT DETECTED NOT  DETECTED Final   Haemophilus influenzae NOT DETECTED NOT DETECTED Final   Neisseria meningitidis NOT DETECTED NOT DETECTED Final   Pseudomonas aeruginosa NOT DETECTED NOT DETECTED Final   Stenotrophomonas maltophilia NOT DETECTED NOT DETECTED Final   Candida albicans NOT DETECTED NOT DETECTED Final   Candida auris NOT DETECTED NOT DETECTED Final   Candida glabrata NOT DETECTED NOT DETECTED Final   Candida krusei NOT DETECTED NOT DETECTED Final   Candida parapsilosis NOT DETECTED NOT DETECTED Final   Candida tropicalis NOT DETECTED NOT DETECTED Final   Cryptococcus neoformans/gattii NOT DETECTED NOT DETECTED Final    Comment: Performed at [facility], [address], [place], [place] [address]  Blood culture (routine x 2)     Status: None (Preliminary result)   Collection Time: 12/31/23  2:15 AM   Specimen: BLOOD  Result Value Ref Range Status   Specimen Description   Final    BLOOD RIGHT ANTECUBITAL Performed at [facility], [address] [place], [place] [address]    Special Requests   Final    BOTTLES DRAWN AEROBIC AND ANAEROBIC Blood Culture adequate volume Performed at [facility], [address] [place], [place] [address]    Culture   Final    NO GROWTH 2 DAYS Performed at [facility], [address] [place], [place] [address]    Report Status PENDING  Incomplete  MRSA Next Gen by PCR, Nasal     Status: None   Collection Time: 12/31/23  9:14 AM   Specimen: Nasal Mucosa; Nasal Swab  Result Value Ref Range Status   MRSA by PCR Next Gen NOT DETECTED NOT DETECTED Final    Comment: (NOTE) The GeneXpert MRSA Assay (FDA approved for  NASAL specimens only), is one component of a comprehensive MRSA colonization surveillance program. It is not intended to diagnose MRSA infection nor to guide or monitor treatment for MRSA infections. Test performance is not FDA approved in patients less than 72 years old. Performed at [facility], [address], [place], [place] [address]   Culture, blood (Routine X 2) w Reflex to ID Panel     Status: None (Preliminary result)   Collection Time: 01/02/24  7:26 AM   Specimen: BLOOD LEFT HAND  Result Value Ref Range Status   Specimen Description   Final    BLOOD LEFT HAND Performed at [facility], [address] [place], [place] [address]    Special Requests   Final    BOTTLES DRAWN AEROBIC ONLY Blood Culture results may not be optimal due to an inadequate volume of blood received in culture bottles Performed at [facility], [address], [place], [place] [address]    Culture PENDING  Incomplete   Report Status PENDING  Incomplete  Culture, blood (Routine X 2) w Reflex to ID Panel     Status: None (Preliminary result)   Collection Time: 01/02/24  7:32 AM   Specimen: BLOOD RIGHT ARM  Result Value Ref Range Status   Specimen Description   Final    BLOOD RIGHT ARM Performed at [facility], [address] [place], [place] [address]    Special Requests   Final    BOTTLES DRAWN AEROBIC ONLY Blood Culture results may not be optimal due to an inadequate volume of blood received in culture bottles Performed at [facility], [address] [place], [place] [address]    Culture  PENDING  Incomplete   Report Status PENDING  Incomplete         Radiology Studies: DG Chest 1 View Result Date: 01/02/2024 CLINICAL DATA:  Severe sepsis EXAM: CHEST  1 VIEW COMPARISON:  X-ray and CT scan 12/31/2023. FINDINGS: Pleural effusions are again seen. Enlarged cardiopericardial silhouette with calcified aorta. Vascular  congestion interstitial edema identified. No consolidation pneumothorax. Kyphotic x-ray obscures the apices more left than right as there is some leftward rotation of the x-ray. Overlapping cardiac leads. IMPRESSION: Enlarged heart with vascular congestion and edema. Right greater than left small pleural effusions Electronically Signed   By: Ranell Bring M.D.   On: 01/02/2024 10:04   US  Abdomen Limited RUQ (LIVER/GB) Result Date: 01/02/2024 CLINICAL DATA:  Severe sepsis EXAM: ULTRASOUND ABDOMEN LIMITED RIGHT UPPER QUADRANT COMPARISON:  Ultrasound 03/10/2023 limited.  CT 12/31/2023. FINDINGS: Gallbladder: Distended gallbladder. There is some wall thickening and wall edema. No shadowing stones or reported Murphy's sign. Common bile duct: Diameter: 3 mm Liver: No focal lesion identified. Within normal limits in parenchymal echogenicity. Portal vein is patent on color Doppler imaging with normal direction of blood flow towards the liver. Other: Scattered ascites.  Pleural effusion. IMPRESSION: Sick gallbladder with some wall thickening and wall edema but no stones. The wall edema and thickening is nonspecific in the presence of ascites. No biliary ductal dilatation. Pleural effusion. Electronically Signed   By: Ranell Bring M.D.   On: 01/02/2024 10:00   ECHOCARDIOGRAM COMPLETE Result Date: 01/01/2024    ECHOCARDIOGRAM REPORT   Patient Name:   DOHA BOLING Date of Exam: 01/01/2024 Medical Rec #:  [id number]        Height:       62.0 in Accession #:    [id number]       Weight:       109.0 lb Date of Birth:  [birth year]        BSA:          1.477 m Patient Age:    73 years         BP:           129/00 mmHg Patient Gender: F                HR:           85 bpm. Exam Location:  Inpatient Procedure: 2D Echo, Color Doppler and Cardiac Doppler (Both Spectral and Color            Flow Doppler were utilized during procedure). Indications:    I50.21 Acute systolic (congestive) heart failure  History:        Patient has  prior history of Echocardiogram examinations, most                 recent 03/30/2023.  Sonographer:    Eva Lash Referring Phys: [phone] Travoris Bushey V Zoye Chandra IMPRESSIONS  1. Left ventricular ejection fraction, by estimation, is 30 to 35%. The left ventricle has moderately decreased function. The left ventricle demonstrates global hypokinesis. The left ventricular internal cavity size was moderately dilated. Left ventricular diastolic parameters are consistent with Grade II diastolic dysfunction (pseudonormalization). Elevated left ventricular end-diastolic pressure.  2. Right ventricular systolic function is normal. The right ventricular size is normal.  3. Left atrial size was moderately dilated.  4. Right atrial size was moderately dilated.  5. The mitral valve is abnormal. Moderate mitral valve regurgitation. No evidence of mitral stenosis.  6. Tricuspid valve regurgitation is moderate.  7. The aortic valve  is normal in structure. There is moderate calcification of the aortic valve. There is moderate thickening of the aortic valve. Aortic valve regurgitation is not visualized. Aortic valve sclerosis is present, with no evidence of aortic valve stenosis.  8. The inferior vena cava is dilated in size with >50% respiratory variability, suggesting right atrial pressure of 8 mmHg. FINDINGS  Left Ventricle: Left ventricular ejection fraction, by estimation, is 30 to 35%. The left ventricle has moderately decreased function. The left ventricle demonstrates global hypokinesis. Definity  contrast agent was given IV to delineate the left ventricular endocardial borders. Strain was performed and the global longitudinal strain is indeterminate. The left ventricular internal cavity size was moderately dilated. There is no left ventricular hypertrophy. Left ventricular diastolic parameters are consistent with Grade II diastolic dysfunction (pseudonormalization). Elevated left ventricular end-diastolic pressure. Right Ventricle: The  right ventricular size is normal. No increase in right ventricular wall thickness. Right ventricular systolic function is normal. Left Atrium: Left atrial size was moderately dilated. Right Atrium: Right atrial size was moderately dilated. Pericardium: There is no evidence of pericardial effusion. Mitral Valve: The mitral valve is abnormal. There is mild thickening of the mitral valve leaflet(s). Moderate mitral valve regurgitation. No evidence of mitral valve stenosis. Tricuspid Valve: The tricuspid valve is normal in structure. Tricuspid valve regurgitation is moderate . No evidence of tricuspid stenosis. Aortic Valve: The aortic valve is normal in structure. There is moderate calcification of the aortic valve. There is moderate thickening of the aortic valve. Aortic valve regurgitation is not visualized. Aortic valve sclerosis is present, with no evidence of aortic valve stenosis. Aortic valve mean gradient measures 4.0 mmHg. Aortic valve peak gradient measures 7.2 mmHg. Aortic valve area, by VTI measures 1.81 cm. Pulmonic Valve: The pulmonic valve was normal in structure. Pulmonic valve regurgitation is mild. No evidence of pulmonic stenosis. Aorta: The aortic root is normal in size and structure. Venous: The inferior vena cava is dilated in size with greater than 50% respiratory variability, suggesting right atrial pressure of 8 mmHg. IAS/Shunts: No atrial level shunt detected by color flow Doppler. Additional Comments: 3D was performed not requiring image post processing on an independent workstation and was indeterminate.  LEFT VENTRICLE PLAX 2D LVIDd:         4.50 cm      Diastology LVIDs:         3.90 cm      LV e' lateral:   6.64 cm/s LV PW:         1.10 cm      LV E/e' lateral: 18.7 LV IVS:        0.90 cm LVOT diam:     2.00 cm LV SV:         33 LV SV Index:   23 LVOT Area:     3.14 cm  LV Volumes (MOD) LV vol d, MOD A2C: 157.0 ml LV vol d, MOD A4C: 148.0 ml LV vol s, MOD A2C: 99.7 ml LV vol s, MOD A4C:  104.0 ml LV SV MOD A2C:     57.3 ml LV SV MOD A4C:     148.0 ml LV SV MOD BP:      50.6 ml RIGHT VENTRICLE TAPSE (M-mode): 1.0 cm LEFT ATRIUM             Index LA diam:        4.40 cm 2.98 cm/m LA Vol (A2C):   82.0 ml 55.52 ml/m LA Vol (A4C):   70.2 ml  47.53 ml/m LA Biplane Vol: 76.6 ml 51.86 ml/m  AORTIC VALVE AV Area (Vmax):    2.03 cm AV Area (Vmean):   1.74 cm AV Area (VTI):     1.81 cm AV Vmax:           134.00 cm/s AV Vmean:          89.800 cm/s AV VTI:            0.184 m AV Peak Grad:      7.2 mmHg AV Mean Grad:      4.0 mmHg LVOT Vmax:         86.60 cm/s LVOT Vmean:        49.800 cm/s LVOT VTI:          0.106 m LVOT/AV VTI ratio: 0.58  AORTA Ao Asc diam: 3.50 cm MITRAL VALVE                TRICUSPID VALVE MV Area (PHT): 5.95 cm     TR Peak grad:   36.5 mmHg MR Peak grad: 92.9 mmHg     TR Vmax:        302.00 cm/s MR Mean grad: 56.0 mmHg MR Vmax:      482.00 cm/s   SHUNTS MR Vmean:     349.0 cm/s    Systemic VTI:  0.11 m MV E velocity: 124.00 cm/s  Systemic Diam: 2.00 cm MV A velocity: 39.20 cm/s MV E/A ratio:  3.16 Maude Emmer MD Electronically signed by Maude Emmer MD Signature Date/Time: 01/01/2024/2:35:36 PM    Final         Scheduled Meds:  amiodarone   100 mg Oral Daily   Chlorhexidine  Gluconate Cloth  6 each Topical Daily   enoxaparin  (LOVENOX ) injection  50 mg Subcutaneous Q24H   feeding supplement  237 mL Oral BID BM   leptospermum manuka honey  1 Application Topical Daily   methimazole   2.5 mg Oral Daily   metoprolol  tartrate  5 mg Intravenous Q6H   Continuous Infusions:  sodium chloride  10 mL/hr at 01/02/24 9281   norepinephrine  (LEVOPHED ) Adult infusion Stopped (01/02/24 0223)   piperacillin -tazobactam (ZOSYN )  IV 3.375 g (01/02/24 0753)     LOS: 2 days    Time spent: 45 minutes    Toribio Hummer, MD [facility]   To contact the attending provider between 7A-7P or the covering provider during after hours 7P-7A, please log into the web site  [email or web address] and access using universal [place] password for that web site. If you do not have the password, please call the hospital operator.  01/02/2024, 10:45 AM

## 2024-01-02 NOTE — Progress Notes (Signed)
 NAMESoley Marshall, MRN:  [id number], DOB:  1950/12/28, LOS: 2 ADMISSION DATE:  12/31/2023, CONSULTATION DATE:  01/01/24 REFERRING MD:  Hospital medicine, CHIEF COMPLAINT:  hypotension   History of Present Illness:  73 y/o lady with pmh of acute on chronic systolic and diastolic heart failure (EF 30-35%), atrial fibrillation on elqiuis, hyperthyroidism who initially came in with a fall 6/16 and was discharged home and subsequently came in on 6/19 with dyspnea. She was found to have af w rvr, aspiration pneumonia vs pneumonitis on ct and decompensated heart failure. She was started on abx and admitted to medicine floor. She had subsequent imrpovement in her respiratory status and was undergoing work up for dysphagia. Her blood culture came back positive as enteroccuss [name] with unclear source.  On 6/20, patient had a fever and soft blood pressure for which she was transferred to icu. She was initially doing well, however she had worsening hypotension upon falling asleep needing 1mcg/min of levophed  peripherally.  On my assessment, patient is resting comfortably, laying flat. She is easily arousable and mentating. She denies any discomfort, difficulty breathing or any pain.  Pertinent  Medical History  As above.  Significant Hospital Events: Including procedures, antibiotic start and stop dates in addition to other pertinent events   6/19 admitted to hospital medicine, needed bipap transiently which was weaned off. Received abx, lasix  40 with improvement.  6/20 had soft blood pressures requiring holding lasix  and albumin . Started on 38mcg/min  Interim History / Subjective:   Off Levophed  this morning Low-grade febrile On 3 L nasal cannula 1.4 L urine output  Objective    Blood pressure (!) 115/52, pulse 97, temperature 98.4 F (36.9 C), resp. rate (!) 24, height 5' 2 (1.575 m), weight 49.4 kg, SpO2 99%.        Intake/Output Summary (Last 24 hours) at 01/02/2024 0818 Last data filed  at 01/02/2024 9281 Gross per 24 hour  Intake 259.27 ml  Output 1450 ml  Net -1190.73 ml   Filed Weights   12/31/23 0426  Weight: 49.4 kg    Examination: General: Elderly, thin frail woman, lying supine, no distress HENT: Ecchymosis over neck and lower extremities No JVD Lungs: Decreased on the right, no accessory muscle use Cardiovascular: irregular, atrial fibrillation on monitor Abdomen: soft, nt, nd Extremities: no c/c/e Neuro: non focal  Labs show hypokalemia 3.5, hyponatremia, lactic decreased to 2.3, no leukocytosis, drop in hemoglobin to 10.6  Resolved problem list   Assessment and Plan  Septic shock due to enterococcus hirae bacteremia, does have multifocal pneumonia versus aspiration. Procal 0.26, white count stable. Lactic acid increased but now downtrending , off pressors this morning --continue zosyn  , ID following --obtain RUQ as gall bladder is potentially a site for infection with this organism.  F/u urine culture -TTE negative, ID recommending TEE --repeat semi upright chest xr in am to assess for persistent effusion --holding off on thoracentesis as bilateral pleural effusions and ascites likely due to  decompensated heart failure   Acute on chronic decompensated systolic and diastolic heart failure.  -- Diuresing well with Lasix     Atrial fibrillation, permanent -Cardiology following, thyroid  parameters okay  PCCM will be available as needed  Best Practice (right click and Reselect all SmartList Selections daily)   Diet/type: dysphagia diet (see orders) DVT prophylaxis systemic heparin  Pressure ulcer(s): N/A GI prophylaxis: N/A Lines: N/A Foley:  N/A Code Status:  full code Last date of multidisciplinary goals of care discussion []   Labs  CBC: Recent Labs  Lab 12/28/23 1352 12/31/23 0108 12/31/23 2309 01/01/24 0828 01/02/24 0308  WBC 7.2 8.0 11.5* 11.6* 7.3  NEUTROABS  --  5.4  --  10.5* 6.1  HGB 12.7 13.4 12.0 12.5 10.6*  HCT  39.7 44.2 38.8 40.4 34.2*  MCV 91.1 94.4 92.2 91.2 93.4  PLT 263 262 183 179 166    Basic Metabolic Panel: Recent Labs  Lab 12/31/23 1009 12/31/23 1552 12/31/23 2309 01/01/24 0828 01/02/24 0308  NA 131* 130* 131* 133* 131*  K 5.2* 5.2* 4.6 4.6 3.5  CL 99 95* 96* 96* 93*  CO2 20* 25 26 25 26   GLUCOSE 63* 74 83 72 157*  BUN 31* 31* 31* 29* 27*  CREATININE 1.60* 1.65* 1.61* 1.45* 1.27*  CALCIUM 9.4 8.8* 8.8* 9.0 8.8*  MG  --   --   --  1.9 1.7  PHOS  --   --   --   --  2.6   GFR: Estimated Creatinine Clearance: 30.3 mL/min (A) (by C-G formula based on SCr of 1.27 mg/dL (H)). Recent Labs  Lab 12/31/23 0108 12/31/23 0209 12/31/23 0413 12/31/23 2309 01/01/24 0828 01/01/24 1902 01/01/24 2151 01/02/24 0308  PROCALCITON  --   --   --   --   --  0.26  --  0.24  WBC 8.0  --   --  11.5* 11.6*  --   --  7.3  LATICACIDVEN  --    < > 1.5  --   --  1.9 2.8* 2.3*   < > = values in this interval not displayed.    Liver Function Tests: Recent Labs  Lab 01/01/24 0828 01/02/24 0308  AST 43* 33  ALT 36 26  ALKPHOS 174* 152*  BILITOT 2.1* 1.7*  PROT 6.3* 6.1*  ALBUMIN  2.4* 3.0*   No results for input(s): LIPASE, AMYLASE in the last 168 hours. No results for input(s): AMMONIA in the last 168 hours.  ABG    Component Value Date/Time   PHART 7.37 12/31/2023 0430   PCO2ART 39 12/31/2023 0430   PO2ART 168 (H) 12/31/2023 0430   HCO3 23.2 12/31/2023 0430   ACIDBASEDEF 2.4 (H) 12/31/2023 0430   O2SAT 99.2 12/31/2023 0430     Coagulation Profile: No results for input(s): INR, PROTIME in the last 168 hours.  Cardiac Enzymes: No results for input(s): CKTOTAL, CKMB, CKMBINDEX, TROPONINI in the last 168 hours.  HbA1C: Hgb A1c MFr Bld  Date/Time Value Ref Range Status  08/16/2023 05:53 AM 6.1 (H) 4.8 - 5.6 % Final    Comment:    (NOTE) Pre diabetes:          5.7%-6.4%  Diabetes:              >6.4%  Glycemic control for   <7.0% adults with diabetes    03/12/2023 03:25 AM 6.1 (H) 4.8 - 5.6 % Final    Comment:    (NOTE) Pre diabetes:          5.7%-6.4%  Diabetes:              >6.4%  Glycemic control for   <7.0% adults with diabetes     CBG: No results for input(s): GLUCAP in the last 168 hours.   Harden Staff MD. [facility] Pager : [phone]  If no response to pager , please call [phone] until 7 pm After 7:00 pm call Elink  [phone]   01/02/2024

## 2024-01-02 NOTE — Plan of Care (Signed)
  Problem: Clinical Measurements: Goal: Respiratory complications will improve Outcome: Progressing   Problem: Nutrition: Goal: Adequate nutrition will be maintained Outcome: Progressing   Problem: Education: Goal: Knowledge of General Education information will improve Description: Including pain rating scale, medication(s)/side effects and non-pharmacologic comfort measures Outcome: Not Progressing   Problem: Clinical Measurements: Goal: Cardiovascular complication will be avoided Outcome: Not Progressing   Problem: Activity: Goal: Risk for activity intolerance will decrease Outcome: Not Progressing   Problem: Pain Managment: Goal: General experience of comfort will improve and/or be controlled Outcome: Not Progressing

## 2024-01-02 NOTE — Progress Notes (Signed)
   01/02/24 2325  BiPAP/CPAP/SIPAP  Reason BIPAP/CPAP not in use Non-compliant (bipap not in room)  BiPAP/CPAP /SiPAP Vitals  Temp 100.2 F (37.9 C)  Pulse Rate (!) 132  Resp (!) 41  SpO2 95 %  MEWS Score/Color  MEWS Score 6  MEWS Score Color Red

## 2024-01-02 NOTE — Consult Note (Addendum)
 [facility]    Date of Admission:  12/31/2023   Total days of inpatient antibiotics 2        Reason for Consult: E Hirai bacteremia    Principal Problem:   Sepsis due to pneumonia [facility]) Active Problems:   Hyperthyroidism   Hypotension   AKI (acute kidney injury) (HCC)   Hyponatremia   Acute on chronic systolic CHF (congestive heart failure) (HCC)   Acute respiratory failure with hypoxia (HCC)   CKD (chronic kidney disease) stage 3, GFR 30-59 ml/min (HCC)   Dysphagia   Aspiration pneumonia of both lungs (HCC)   Bilateral pleural effusion   Lactic acidosis   Permanent atrial fibrillation [facility])   Assessment: 73 year old female with A-fib on Eliquis , hypertension, rheumatoid arthritis, recent ED visitfollowing a fall discharged on 6/16 presented again with worsening shortness of breath found to have #Enterococcus hirae bacteremia secondary to pulmonary source -Blood cul CT chest showed diffuse bilateral bronchopneumonia, bilateral lower lobe consolidation.  Tures from admission 2/4 bottles Enterococcus - Reviewed UA from 6/20 negative nitrites negative leukocytes. -It appears she is improving, she was eating dinner in bed during my visit. Recommendations:  -Continue pip-tazo - Repeat blood cultures - TTE will need TEE -Sputum Cx - Exchange lines: pic and cath  Evaluation of this patient requires complex antimicrobial therapy evaluation and counseling + isolation needs for disease transmission risk assessment and mitigation   Microbiology:   Antibiotics: Piptazo 6/18-  Cultures: Blood 6/18 2/4 bottles Enterococcus hirae    HPI: Rhonda Marshall is a 73 y.o. female with Afib on eliquis , HTN rheumatoid arthritis, hypothyroidism, heart failure with reduced ejection fraction admitted to the hospital for volume overload and acute on chronic heart failure, sepsis secondary to multifocal pneumonia.  She had a fall at home on 6/16 sustaining  laceration to her face.  CT head unremarkable.  Possible radial fracture.  She was discharged on 6/16 family concern of worsening edema shortness of breath.  On arrival patient was febrile.  Blood cultures returned to rule EHIrai.    Review of Systems: Review of Systems  All other systems reviewed and are negative.   Past Medical History:  Diagnosis Date   Anxiety    Atrial fibrillation (HCC)    newly diagnosed 10/2010   Family history of adverse reaction to anesthesia    Sister's BP drops    Hip fracture (HCC) 01/18/2022   Hypertension    Rheumatoid arthritis(714.0)    Subclinical hyperthyroidism     Social History   Tobacco Use   Smoking status: Former    Current packs/day: 0.00    Average packs/day: 0.3 packs/day for 54.2 years (13.6 ttl pk-yrs)    Types: Cigarettes    Start date: 83    Quit date: 10/2019    Years since quitting: 4.2    Passive exposure: Past   Smokeless tobacco: Never  Vaping Use   Vaping status: Never Used  Substance Use Topics   Alcohol use: Not Currently    Alcohol/week: 0.0 standard drinks of alcohol   Drug use: No    Family History  Problem Relation Age of Onset   Heart attack Mother 44       pacemaker age 81   Hypothyroidism Mother    Cancer Father        colon and prostate   Hypothyroidism Sister    Cancer Sister    Diabetes Brother    CAD Brother  70   Cancer Brother        Renal   Scheduled Meds:  amiodarone   100 mg Oral Daily   Chlorhexidine  Gluconate Cloth  6 each Topical Daily   enoxaparin  (LOVENOX ) injection  50 mg Subcutaneous Q24H   feeding supplement  237 mL Oral BID BM   leptospermum manuka honey  1 Application Topical Daily   methimazole   2.5 mg Oral Daily   metoprolol  tartrate  5 mg Intravenous Q6H   Continuous Infusions:  sodium chloride  250 mL (01/01/24 2139)   norepinephrine  (LEVOPHED ) Adult infusion 2 mcg/min (01/01/24 2229)   piperacillin -tazobactam (ZOSYN )  IV Stopped (01/02/24 0456)   PRN  Meds:.acetaminophen  **OR** acetaminophen , albuterol , ALPRAZolam , diclofenac  Sodium, ondansetron  **OR** ondansetron  (ZOFRAN ) IV, traZODone  Allergies  Allergen Reactions   Clindamycin/Lincomycin Rash   Latex Rash   Plaquenil  [Hydroxychloroquine ] Other (See Comments)    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 rate (!) 28, height 5' 2 (1.575 m), weight 49.4 kg, SpO2 97%.  Physical Exam Constitutional:      Appearance: Normal appearance.  HENT:     Head: Normocephalic and atraumatic.     Right Ear: Tympanic membrane normal.     Left Ear: Tympanic membrane normal.     Nose: Nose normal.     Mouth/Throat:     Mouth: Mucous membranes are moist.   Eyes:     Extraocular Movements: Extraocular movements intact.     Conjunctiva/sclera: Conjunctivae normal.     Pupils: Pupils are equal, round, and reactive to light.    Cardiovascular:     Rate and Rhythm: Normal rate and regular rhythm.     Heart sounds: No murmur heard.    No friction rub. No gallop.  Pulmonary:     Effort: Pulmonary effort is normal.     Breath sounds: Normal breath sounds.  Abdominal:     General: Abdomen is flat.     Palpations: Abdomen is soft.   Musculoskeletal:        General: Normal range of motion.   Skin:    General: Skin is warm and dry.   Neurological:     General: No focal deficit present.     Mental Status: She is alert and oriented to person, place, and time.   Psychiatric:        Mood and Affect: Mood normal.     Lab Results Lab Results  Component Value Date   WBC 7.3 01/02/2024   HGB 10.6 (L) 01/02/2024   HCT 34.2 (L) 01/02/2024   MCV 93.4 01/02/2024   PLT 166 01/02/2024    Lab Results  Component Value Date   CREATININE 1.27 (H) 01/02/2024   BUN 27 (H) 01/02/2024   NA 131 (L) 01/02/2024   K 3.5 01/02/2024   CL 93 (L) 01/02/2024   CO2 26 01/02/2024    Lab Results  Component Value Date   ALT 26  01/02/2024   AST 33 01/02/2024   ALKPHOS 152 (H) 01/02/2024   BILITOT 1.7 (H) 01/02/2024       Loney Stank, MD [facility] 01/02/2024, 5:51 AM

## 2024-01-02 NOTE — Progress Notes (Signed)
 Rhonda Marshall 9:07 AM  Subjective: Patient seen and examined in her hospital computer chart reviewed and her case discussed with her nurse and my partner Dr. Burnette and she is swallowing okay did not have a problem at home until her fall from a swallowing issue and has no other GI complaints  Objective: Vital signs stable afebrile no acute distress abdomen is soft nontender CT and labs reviewed  Assessment: Dysphagia probably from fall  Plan: No current recommendations please let me know if I can be helpful this weekend and call hospital team if GI question or problem next week may advance diet per swallowing team recommendations and our team will be on standby to help as needed  [facility]  office [phone] After 5PM or if no answer call [phone]

## 2024-01-03 ENCOUNTER — Inpatient Hospital Stay (HOSPITAL_COMMUNITY)

## 2024-01-03 DIAGNOSIS — B952 Enterococcus as the cause of diseases classified elsewhere: Secondary | ICD-10-CM | POA: Diagnosis not present

## 2024-01-03 DIAGNOSIS — I5022 Chronic systolic (congestive) heart failure: Secondary | ICD-10-CM | POA: Diagnosis not present

## 2024-01-03 DIAGNOSIS — R7881 Bacteremia: Secondary | ICD-10-CM | POA: Diagnosis not present

## 2024-01-03 DIAGNOSIS — R5383 Other fatigue: Secondary | ICD-10-CM

## 2024-01-03 DIAGNOSIS — J180 Bronchopneumonia, unspecified organism: Secondary | ICD-10-CM | POA: Diagnosis not present

## 2024-01-03 DIAGNOSIS — E871 Hypo-osmolality and hyponatremia: Secondary | ICD-10-CM | POA: Diagnosis not present

## 2024-01-03 DIAGNOSIS — I4891 Unspecified atrial fibrillation: Secondary | ICD-10-CM | POA: Diagnosis not present

## 2024-01-03 DIAGNOSIS — J9601 Acute respiratory failure with hypoxia: Secondary | ICD-10-CM | POA: Diagnosis not present

## 2024-01-03 DIAGNOSIS — J189 Pneumonia, unspecified organism: Secondary | ICD-10-CM | POA: Diagnosis not present

## 2024-01-03 DIAGNOSIS — R6521 Severe sepsis with septic shock: Secondary | ICD-10-CM | POA: Diagnosis not present

## 2024-01-03 DIAGNOSIS — J690 Pneumonitis due to inhalation of food and vomit: Secondary | ICD-10-CM | POA: Diagnosis not present

## 2024-01-03 LAB — CBC WITH DIFFERENTIAL/PLATELET
Abs Immature Granulocytes: 0.04 10*3/uL (ref 0.00–0.07)
Abs Immature Granulocytes: 0.02 10*3/uL (ref 0.00–0.07)
Basophils Absolute: 0 10*3/uL (ref 0.0–0.1)
Basophils Absolute: 0.1 10*3/uL (ref 0.0–0.1)
Basophils Relative: 1 %
Basophils Relative: 1 %
Eosinophils Absolute: 0 10*3/uL (ref 0.0–0.5)
Eosinophils Absolute: 0.1 10*3/uL (ref 0.0–0.5)
Eosinophils Relative: 0 %
Eosinophils Relative: 1 %
HCT: 37.1 % (ref 36.0–46.0)
HCT: 39.8 % (ref 36.0–46.0)
Hemoglobin: 11.5 g/dL — ABNORMAL LOW (ref 12.0–15.0)
Hemoglobin: 12.6 g/dL (ref 12.0–15.0)
Immature Granulocytes: 1 %
Immature Granulocytes: 0 %
Lymphocytes Relative: 5 %
Lymphocytes Relative: 10 %
Lymphs Abs: 0.3 10*3/uL — ABNORMAL LOW (ref 0.7–4.0)
Lymphs Abs: 0.8 10*3/uL (ref 0.7–4.0)
MCH: 29.2 pg (ref 26.0–34.0)
MCH: 28.7 pg (ref 26.0–34.0)
MCHC: 31 g/dL (ref 30.0–36.0)
MCHC: 31.7 g/dL (ref 30.0–36.0)
MCV: 94.2 fL (ref 80.0–100.0)
MCV: 90.7 fL (ref 80.0–100.0)
Monocytes Absolute: 0.4 10*3/uL (ref 0.1–1.0)
Monocytes Absolute: 0.6 10*3/uL (ref 0.1–1.0)
Monocytes Relative: 7 %
Monocytes Relative: 9 %
Neutro Abs: 5.5 10*3/uL (ref 1.7–7.7)
Neutro Abs: 5.8 10*3/uL (ref 1.7–7.7)
Neutrophils Relative %: 86 %
Neutrophils Relative %: 79 %
Platelets: 154 10*3/uL (ref 150–400)
Platelets: 170 10*3/uL (ref 150–400)
RBC: 3.94 MIL/uL (ref 3.87–5.11)
RBC: 4.39 MIL/uL (ref 3.87–5.11)
RDW: 18.1 % — ABNORMAL HIGH (ref 11.5–15.5)
RDW: 18.5 % — ABNORMAL HIGH (ref 11.5–15.5)
WBC: 6.3 10*3/uL (ref 4.0–10.5)
WBC: 7.3 10*3/uL (ref 4.0–10.5)
nRBC: 0 % (ref 0.0–0.2)
nRBC: 0 % (ref 0.0–0.2)

## 2024-01-03 LAB — BLOOD GAS, ARTERIAL
Acid-Base Excess: 7.4 mmol/L — ABNORMAL HIGH (ref 0.0–2.0)
Bicarbonate: 32 mmol/L — ABNORMAL HIGH (ref 20.0–28.0)
Drawn by: 331471
O2 Content: 3 L/min
O2 Saturation: 98.9 %
Patient temperature: 36.1
pCO2 arterial: 42 mmHg (ref 32–48)
pH, Arterial: 7.48 — ABNORMAL HIGH (ref 7.35–7.45)
pO2, Arterial: 111 mmHg — ABNORMAL HIGH (ref 83–108)

## 2024-01-03 LAB — COMPREHENSIVE METABOLIC PANEL WITH GFR
ALT: 27 U/L (ref 0–44)
ALT: 23 U/L (ref 0–44)
AST: 36 U/L (ref 15–41)
AST: 29 U/L (ref 15–41)
Albumin: 2.7 g/dL — ABNORMAL LOW (ref 3.5–5.0)
Albumin: 2.3 g/dL — ABNORMAL LOW (ref 3.5–5.0)
Alkaline Phosphatase: 161 U/L — ABNORMAL HIGH (ref 38–126)
Alkaline Phosphatase: 141 U/L — ABNORMAL HIGH (ref 38–126)
Anion gap: 12 (ref 5–15)
Anion gap: 8 (ref 5–15)
BUN: 23 mg/dL (ref 8–23)
BUN: 23 mg/dL (ref 8–23)
CO2: 24 mmol/L (ref 22–32)
CO2: 28 mmol/L (ref 22–32)
Calcium: 9.1 mg/dL (ref 8.9–10.3)
Calcium: 8.5 mg/dL — ABNORMAL LOW (ref 8.9–10.3)
Chloride: 96 mmol/L — ABNORMAL LOW (ref 98–111)
Chloride: 95 mmol/L — ABNORMAL LOW (ref 98–111)
Creatinine, Ser: 1.06 mg/dL — ABNORMAL HIGH (ref 0.44–1.00)
Creatinine, Ser: 0.93 mg/dL (ref 0.44–1.00)
GFR, Estimated: 55 mL/min — ABNORMAL LOW (ref >60)
GFR, Estimated: >60 mL/min (ref >60)
Glucose, Bld: 145 mg/dL — ABNORMAL HIGH (ref 70–99)
Glucose, Bld: 113 mg/dL — ABNORMAL HIGH (ref 70–99)
Potassium: 3.4 mmol/L — ABNORMAL LOW (ref 3.5–5.1)
Potassium: 3.6 mmol/L (ref 3.5–5.1)
Sodium: 132 mmol/L — ABNORMAL LOW (ref 135–145)
Sodium: 131 mmol/L — ABNORMAL LOW (ref 135–145)
Total Bilirubin: 2 mg/dL — ABNORMAL HIGH (ref 0.0–1.2)
Total Bilirubin: 1.6 mg/dL — ABNORMAL HIGH (ref 0.0–1.2)
Total Protein: 6.2 g/dL — ABNORMAL LOW (ref 6.5–8.1)
Total Protein: 5.9 g/dL — ABNORMAL LOW (ref 6.5–8.1)

## 2024-01-03 LAB — PHOSPHORUS: Phosphorus: 2.4 mg/dL — ABNORMAL LOW (ref 2.5–4.6)

## 2024-01-03 LAB — LACTIC ACID, PLASMA: Lactic Acid, Venous: 2.8 mmol/L (ref 0.5–1.9)

## 2024-01-03 LAB — GLUCOSE, CAPILLARY: Glucose-Capillary: 133 mg/dL — ABNORMAL HIGH (ref 70–99)

## 2024-01-03 LAB — PROCALCITONIN: Procalcitonin: 0.25 ng/mL

## 2024-01-03 LAB — MAGNESIUM: Magnesium: 1.7 mg/dL (ref 1.7–2.4)

## 2024-01-03 LAB — AMMONIA: Ammonia: 23 umol/L (ref 9–35)

## 2024-01-03 MED ORDER — FUROSEMIDE 10 MG/ML IJ SOLN
20.0000 mg | Freq: Two times a day (BID) | INTRAMUSCULAR | Status: DC
  Administered 2024-01-03 – 2024-01-05 (×5): 20 mg via INTRAVENOUS
  Filled 2024-01-03 (×5): qty 2

## 2024-01-03 MED ORDER — POTASSIUM CHLORIDE 20 MEQ PO PACK
40.0000 meq | PACK | Freq: Once | ORAL | Status: AC
  Administered 2024-01-03: 40 meq via ORAL
  Filled 2024-01-03: qty 2

## 2024-01-03 MED ORDER — SODIUM CHLORIDE 0.9 % IV SOLN: INTRAVENOUS | Status: AC

## 2024-01-03 MED ORDER — MAGNESIUM SULFATE 2 GM/50ML IV SOLN
2.0000 g | Freq: Once | INTRAVENOUS | Status: AC
  Administered 2024-01-03: 2 g via INTRAVENOUS
  Filled 2024-01-03: qty 50

## 2024-01-03 MED ORDER — LACTATED RINGERS IV BOLUS
250.0000 mL | Freq: Once | INTRAVENOUS | Status: AC
  Administered 2024-01-03: 250 mL via INTRAVENOUS

## 2024-01-03 MED ORDER — K PHOS MONO-SOD PHOS DI & MONO 155-852-130 MG PO TABS
250.0000 mg | ORAL_TABLET | Freq: Two times a day (BID) | ORAL | Status: AC
  Administered 2024-01-03 – 2024-01-05 (×6): 250 mg via ORAL
  Filled 2024-01-03 (×6): qty 1

## 2024-01-03 MED ORDER — ENOXAPARIN SODIUM 60 MG/0.6ML IJ SOSY
50.0000 mg | PREFILLED_SYRINGE | Freq: Two times a day (BID) | INTRAMUSCULAR | Status: DC
  Administered 2024-01-03 – 2024-01-05 (×4): 50 mg via SUBCUTANEOUS
  Filled 2024-01-03 (×4): qty 0.6

## 2024-01-03 NOTE — Plan of Care (Signed)

## 2024-01-03 NOTE — Progress Notes (Signed)
 RN went into the room when PT asked if she had been asleep for awhile at 1245.  PT was having a hard time waking her up.  RN stated that the patient was bathed and linens were changed at about 1150.  The patient was AXO X4 but after the bath, she said she wanted to take a nap.    The RN started to try and wake her up.  RN started to talk loudly to wake her up, sternal rub the patient's chest, painful stimuli to patient's nailbeds and bottom of the feet, shining pen light in the eyes.  The son who was in the room tried to get her to open her eyes.  The patient responded to some painful stimuli by raising her eyes and eyebrows but never opened her eyes.  She grimaced to painful stimuli.  Occasionally when asked a few questions she would shake her head no.  But specifically to painful sternal rubs, she would not open her eyes.  Even when the charge nurse came in and attempted sternal rubs.  She did yell out twice to sternal rubs and start to cough.  Vitals during this assessment were BP 97/64 (73), 84 pulse, 81 EKG, 19 RR, 100% on 3L [place] BP 110/53 (65),  86 Pulse, 88 EKG, 88 EKG, 19 RR,  97^ BP 113/71 (77), 84 Pulse, 89 EKG, 16 RR, 97%  The patient was given lopressor  at 1139.  No sedation medication were given today.  MD was paged and updated.  Continue to monitor was orders.

## 2024-01-03 NOTE — Progress Notes (Signed)
 OT Cancellation Note  Patient Details Name: Rhonda Marshall MRN: [id number] DOB: Aug 25, 1950   Cancelled Treatment:    Reason Eval/Treat Not Completed: Fatigue/lethargy limiting ability to participate Checked on patient this afternoon. Patient lethargic and unable to rouse sufficiently to participate in evaluation. OT to continue to follow and check back as schedule will allow. Geofm LEYLAND, MS Acute Rehabilitation Department Office# [phone]  01/03/2024, 2:21 PM

## 2024-01-03 NOTE — Progress Notes (Signed)
 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   Medical History: Past Medical History:  Diagnosis Date   Anxiety    Atrial fibrillation (HCC)    newly diagnosed 10/2010   Family history of adverse reaction to anesthesia    Sister's BP drops    Hip fracture (HCC) 01/18/2022   Hypertension    Rheumatoid arthritis(714.0)    Subclinical hyperthyroidism     Medications:  - on Eliquis  PTA  Assessment: Patient is a 73 y.o F with hx afib on Eliquis  PTA who presented to the ED on 12/31/23 with c/o dysphagia and dyspnea. Chest/abd CT on 01/01/24 showed diffuse bilateral bronchopneumonia.  Neck CT on 12/31/23 showed debris within the esophagus. Pharmacy has been consulted on 01/01/24 to transition patient from Eliquis  to therapeutic dose LMWH due to at risk for aspiration and in case invasive intervention is needed for patient.  Today, 01/03/2024: -  Hgb and plts ok -  scr trending down 1.06 (crcl ~37) - plan for TEE on 6/23 for r/o endocarditis  Goal of Therapy:  Anti-Xa level 0.6-1 units/ml 4hrs after LMWH dose given Monitor platelets by anticoagulation protocol: Yes   Plan:  - Adjust lovenox  dose to 50 mg SQ q12h for  crcl >30 - monitor cbc at least q72h - monitor for s/sx bleeding   Jamahl Lemmons P 01/03/2024,9:36 AM

## 2024-01-03 NOTE — Progress Notes (Signed)
 The daughter and caregiver, Boneta Standre will be working out of town the week of June 23rd.  She will be three hours away.    If the patient is going to be discharged, please call the daughter 4 hours before discharge to give her time to get back to [facility] to pick her mother up.  Her contact information is the demographics.  MD knows about this request.  Delon Croak, RN

## 2024-01-03 NOTE — Progress Notes (Signed)
 PT Cancellation Note  Patient Details Name: Rhonda Marshall MRN: [id number] DOB: 03/02/1951   Cancelled Treatment:     PT order received but eval deferred - pt lethargic and unable to rouse sufficiently to participate with PT/OT.  Will follow.   Alwyn Cordner 01/03/2024, 1:56 PM

## 2024-01-03 NOTE — Progress Notes (Signed)
 Progress Note  Patient Name: Rhonda Marshall Date of Encounter: 01/03/2024  Primary Cardiologist: Lynwood Schilling, MD   Subjective   No chest pain or sob.   Inpatient Medications    Scheduled Meds:  amiodarone   100 mg Oral Daily   Chlorhexidine  Gluconate Cloth  6 each Topical Daily   enoxaparin  (LOVENOX ) injection  50 mg Subcutaneous Q12H   feeding supplement  237 mL Oral BID BM   furosemide   20 mg Intravenous BID   leptospermum manuka honey  1 Application Topical Daily   methimazole   2.5 mg Oral Daily   metoprolol  tartrate  5 mg Intravenous Q6H   phosphorus  250 mg Oral BID   Continuous Infusions:  sodium chloride      norepinephrine  (LEVOPHED ) Adult infusion Stopped (01/02/24 0223)   piperacillin -tazobactam (ZOSYN )  IV 12.5 mL/hr at 01/03/24 0914   PRN Meds: acetaminophen  **OR** acetaminophen , albuterol , ALPRAZolam , diclofenac  Sodium, ondansetron  **OR** ondansetron  (ZOFRAN ) IV, traZODone    Vital Signs    Vitals:   01/03/24 0700 01/03/24 0800 01/03/24 0847 01/03/24 0955  BP: (!) 119/92 124/89    Pulse: 84 87 88 97  Resp: (!) 23 (!) 24 18 (!) 26  Temp: (!) 97.5 F (36.4 C) 97.7 F (36.5 C) 97.7 F (36.5 C) (!) 97.5 F (36.4 C)  TempSrc:  Bladder    SpO2: 100% 99% 99% 98%  Weight:    51.1 kg  Height:        Intake/Output Summary (Last 24 hours) at 01/03/2024 1006 Last data filed at 01/03/2024 0914 Gross per 24 hour  Intake 599.66 ml  Output 800 ml  Net -200.34 ml   Filed Weights   12/31/23 0426 01/03/24 0955  Weight: 49.4 kg 51.1 kg    Telemetry    Afib with a CVR - Personally Reviewed  ECG    none - Personally Reviewed  Physical Exam   GEN: No acute distress.   Neck: No JVD; large area of ecchymosis Cardiac: IRIRR, no murmurs, rubs, or gallops.  Respiratory: Clear to auscultation bilaterally. GI: Soft, nontender, non-distended  MS: No edema; No deformity. Neuro:  Nonfocal  Psych: Normal affect   Labs    Chemistry Recent Labs  Lab  01/01/24 0828 01/02/24 0308 01/03/24 0218  NA 133* 131* 132*  K 4.6 3.5 3.4*  CL 96* 93* 96*  CO2 25 26 24   GLUCOSE 72 157* 145*  BUN 29* 27* 23  CREATININE 1.45* 1.27* 1.06*  CALCIUM 9.0 8.8* 9.1  PROT 6.3* 6.1* 6.2*  ALBUMIN  2.4* 3.0* 2.7*  AST 43* 33 36  ALT 36 26 27  ALKPHOS 174* 152* 161*  BILITOT 2.1* 1.7* 2.0*  GFRNONAA 38* 44* 55*  ANIONGAP 12 12 12      Hematology Recent Labs  Lab 01/01/24 0828 01/02/24 0308 01/03/24 0218  WBC 11.6* 7.3 6.3  RBC 4.43 3.66* 3.94  HGB 12.5 10.6* 11.5*  HCT 40.4 34.2* 37.1  MCV 91.2 93.4 94.2  MCH 28.2 29.0 29.2  MCHC 30.9 31.0 31.0  RDW 18.6* 18.2* 18.1*  PLT 179 166 154    Cardiac EnzymesNo results for input(s): TROPONINI in the last 168 hours. No results for input(s): TROPIPOC in the last 168 hours.   BNP Recent Labs  Lab 12/31/23 0108  BNP >4,500.0*     DDimer No results for input(s): DDIMER in the last 168 hours.   Radiology    DG Chest 1 View Result Date: 01/02/2024 CLINICAL DATA:  Severe sepsis EXAM: CHEST  1 VIEW  COMPARISON:  X-ray and CT scan 12/31/2023. FINDINGS: Pleural effusions are again seen. Enlarged cardiopericardial silhouette with calcified aorta. Vascular congestion interstitial edema identified. No consolidation pneumothorax. Kyphotic x-ray obscures the apices more left than right as there is some leftward rotation of the x-ray. Overlapping cardiac leads. IMPRESSION: Enlarged heart with vascular congestion and edema. Right greater than left small pleural effusions Electronically Signed   By: Ranell Bring M.D.   On: 01/02/2024 10:04   US  Abdomen Limited RUQ (LIVER/GB) Result Date: 01/02/2024 CLINICAL DATA:  Severe sepsis EXAM: ULTRASOUND ABDOMEN LIMITED RIGHT UPPER QUADRANT COMPARISON:  Ultrasound 03/10/2023 limited.  CT 12/31/2023. FINDINGS: Gallbladder: Distended gallbladder. There is some wall thickening and wall edema. No shadowing stones or reported Murphy's sign. Common bile duct: Diameter: 3  mm Liver: No focal lesion identified. Within normal limits in parenchymal echogenicity. Portal vein is patent on color Doppler imaging with normal direction of blood flow towards the liver. Other: Scattered ascites.  Pleural effusion. IMPRESSION: Sick gallbladder with some wall thickening and wall edema but no stones. The wall edema and thickening is nonspecific in the presence of ascites. No biliary ductal dilatation. Pleural effusion. Electronically Signed   By: Ranell Bring M.D.   On: 01/02/2024 10:00   ECHOCARDIOGRAM COMPLETE Result Date: 01/01/2024    ECHOCARDIOGRAM REPORT   Patient Name:   Rhonda Marshall Date of Exam: 01/01/2024 Medical Rec #:  [id number]        Height:       62.0 in Accession #:    [id number]       Weight:       109.0 lb Date of Birth:  1950/10/29        BSA:          1.477 m Patient Age:    73 years         BP:           129/00 mmHg Patient Gender: F                HR:           85 bpm. Exam Location:  Inpatient Procedure: 2D Echo, Color Doppler and Cardiac Doppler (Both Spectral and Color            Flow Doppler were utilized during procedure). Indications:    I50.21 Acute systolic (congestive) heart failure  History:        Patient has prior history of Echocardiogram examinations, most                 recent 03/30/2023.  Sonographer:    Eva Lash Referring Phys: [phone] DANIEL V THOMPSON IMPRESSIONS  1. Left ventricular ejection fraction, by estimation, is 30 to 35%. The left ventricle has moderately decreased function. The left ventricle demonstrates global hypokinesis. The left ventricular internal cavity size was moderately dilated. Left ventricular diastolic parameters are consistent with Grade II diastolic dysfunction (pseudonormalization). Elevated left ventricular end-diastolic pressure.  2. Right ventricular systolic function is normal. The right ventricular size is normal.  3. Left atrial size was moderately dilated.  4. Right atrial size was moderately dilated.  5. The mitral  valve is abnormal. Moderate mitral valve regurgitation. No evidence of mitral stenosis.  6. Tricuspid valve regurgitation is moderate.  7. The aortic valve is normal in structure. There is moderate calcification of the aortic valve. There is moderate thickening of the aortic valve. Aortic valve regurgitation is not visualized. Aortic valve sclerosis is present, with no evidence of aortic valve  stenosis.  8. The inferior vena cava is dilated in size with >50% respiratory variability, suggesting right atrial pressure of 8 mmHg. FINDINGS  Left Ventricle: Left ventricular ejection fraction, by estimation, is 30 to 35%. The left ventricle has moderately decreased function. The left ventricle demonstrates global hypokinesis. Definity  contrast agent was given IV to delineate the left ventricular endocardial borders. Strain was performed and the global longitudinal strain is indeterminate. The left ventricular internal cavity size was moderately dilated. There is no left ventricular hypertrophy. Left ventricular diastolic parameters are consistent with Grade II diastolic dysfunction (pseudonormalization). Elevated left ventricular end-diastolic pressure. Right Ventricle: The right ventricular size is normal. No increase in right ventricular wall thickness. Right ventricular systolic function is normal. Left Atrium: Left atrial size was moderately dilated. Right Atrium: Right atrial size was moderately dilated. Pericardium: There is no evidence of pericardial effusion. Mitral Valve: The mitral valve is abnormal. There is mild thickening of the mitral valve leaflet(s). Moderate mitral valve regurgitation. No evidence of mitral valve stenosis. Tricuspid Valve: The tricuspid valve is normal in structure. Tricuspid valve regurgitation is moderate . No evidence of tricuspid stenosis. Aortic Valve: The aortic valve is normal in structure. There is moderate calcification of the aortic valve. There is moderate thickening of the  aortic valve. Aortic valve regurgitation is not visualized. Aortic valve sclerosis is present, with no evidence of aortic valve stenosis. Aortic valve mean gradient measures 4.0 mmHg. Aortic valve peak gradient measures 7.2 mmHg. Aortic valve area, by VTI measures 1.81 cm. Pulmonic Valve: The pulmonic valve was normal in structure. Pulmonic valve regurgitation is mild. No evidence of pulmonic stenosis. Aorta: The aortic root is normal in size and structure. Venous: The inferior vena cava is dilated in size with greater than 50% respiratory variability, suggesting right atrial pressure of 8 mmHg. IAS/Shunts: No atrial level shunt detected by color flow Doppler. Additional Comments: 3D was performed not requiring image post processing on an independent workstation and was indeterminate.  LEFT VENTRICLE PLAX 2D LVIDd:         4.50 cm      Diastology LVIDs:         3.90 cm      LV e' lateral:   6.64 cm/s LV PW:         1.10 cm      LV E/e' lateral: 18.7 LV IVS:        0.90 cm LVOT diam:     2.00 cm LV SV:         33 LV SV Index:   23 LVOT Area:     3.14 cm  LV Volumes (MOD) LV vol d, MOD A2C: 157.0 ml LV vol d, MOD A4C: 148.0 ml LV vol s, MOD A2C: 99.7 ml LV vol s, MOD A4C: 104.0 ml LV SV MOD A2C:     57.3 ml LV SV MOD A4C:     148.0 ml LV SV MOD BP:      50.6 ml RIGHT VENTRICLE TAPSE (M-mode): 1.0 cm LEFT ATRIUM             Index LA diam:        4.40 cm 2.98 cm/m LA Vol (A2C):   82.0 ml 55.52 ml/m LA Vol (A4C):   70.2 ml 47.53 ml/m LA Biplane Vol: 76.6 ml 51.86 ml/m  AORTIC VALVE AV Area (Vmax):    2.03 cm AV Area (Vmean):   1.74 cm AV Area (VTI):     1.81 cm AV Vmax:  134.00 cm/s AV Vmean:          89.800 cm/s AV VTI:            0.184 m AV Peak Grad:      7.2 mmHg AV Mean Grad:      4.0 mmHg LVOT Vmax:         86.60 cm/s LVOT Vmean:        49.800 cm/s LVOT VTI:          0.106 m LVOT/AV VTI ratio: 0.58  AORTA Ao Asc diam: 3.50 cm MITRAL VALVE                TRICUSPID VALVE MV Area (PHT): 5.95 cm      TR Peak grad:   36.5 mmHg MR Peak grad: 92.9 mmHg     TR Vmax:        302.00 cm/s MR Mean grad: 56.0 mmHg MR Vmax:      482.00 cm/s   SHUNTS MR Vmean:     349.0 cm/s    Systemic VTI:  0.11 m MV E velocity: 124.00 cm/s  Systemic Diam: 2.00 cm MV A velocity: 39.20 cm/s MV E/A ratio:  3.16 Maude Emmer MD Electronically signed by Maude Emmer MD Signature Date/Time: 01/01/2024/2:35:36 PM    Final     Cardiac Studies   See above  Patient Profile     73 y.o. female admitted for fall/sepsis/resp failure, acute on chronic heart failure.  Assessment & Plan    Enterococcal bacteremia - the patient has improved with IV anti-biotics. She will never be a candidate for cardiac surgery so TEE is not indicated. In addition, she has had radiation to her throat/neck 4 years ago, another relative contra-indication. I would suggest 6 weeks of IV anti-biotics. I have cancelled TEE. It is contra-indicated. Acute on chronic systolic heart failure - she appears improved clinically. Continue supportive care. Her weight is up 2kg from that recorded on admit. Follow and consider switching to lasix  40 mg IV daily.    For questions or updates, please contact [facility] Please consult [email or web address] for contact info under Cardiology/STEMI.      Signed, Danelle Birmingham, MD  01/03/2024, 10:06 AM

## 2024-01-03 NOTE — Progress Notes (Signed)
 Was called by RN that patient lethargic and difficult to arouse. Came to assess patient. Subjective: Patient drowsy and lethargic.  Opens eyes to noxious stimuli. General: Drowsy, lethargic. Respiratory: CTAB anterior lung fields Cardiovascular: Irregularly irregular.  No murmurs rubs or gallops.  No JVD.  No lower extremity edema. GI: Abdomen is soft, nontender, nondistended, positive bowel sounds Extremities: No clubbing cyanosis or pitting edema.  Assessment/plan #1 lethargy/drowsiness -Patient noted to be more lethargic and drowsy this afternoon. Opens eyes to noxious stimuli but drifts back off to sleep. -Following some commands. -Moving extremities spontaneously. -Check ammonia level, ABG, CBC, CMet, CT head. - Supportive care.   Time spent 35 minutes

## 2024-01-03 NOTE — Progress Notes (Signed)
   01/03/24 2256  BiPAP/CPAP/SIPAP  Reason BIPAP/CPAP not in use Other(comment) (ON STANDBY, PATIENT IS DOING WELL AT THIS TIME.)  BiPAP/CPAP /SiPAP Vitals  Temp 99.5 F (37.5 C)  Pulse Rate 89  Resp (!) 21  SpO2 93 %  MEWS Score/Color  MEWS Score 1  MEWS Score Color Green

## 2024-01-03 NOTE — Progress Notes (Signed)
   01/03/24 0111  BiPAP/CPAP/SIPAP  $ Non-Invasive Ventilator  Non-Invasive Vent Initial  $ Face Mask Medium Yes  BiPAP/CPAP/SIPAP Pt Type Adult  BiPAP/CPAP/SIPAP SERVO  Mask Type Full face mask  Dentures removed? Not applicable  Mask Size Medium  Set Rate 16 breaths/min  Respiratory Rate 23 breaths/min  IPAP 12 cmH20  EPAP 6 cmH2O  FiO2 (%) 30 %  Minute Ventilation 9.9  Leak 47  Peak Inspiratory Pressure (PIP) 12  Tidal Volume (Vt) 385  Patient Home Machine No  Patient Home Mask No  Patient Home Tubing No  Auto Titrate No  Press High Alarm 25 cmH2O  Press Low Alarm 5 cmH2O  CPAP/SIPAP surface wiped down Yes  Device Plugged into RED Power Outlet Yes  BiPAP/CPAP /SiPAP Vitals  Temp 98.1 F (36.7 C)  Pulse Rate (!) 125  Resp (!) 24  BP 127/85  SpO2 99 %  MEWS Score/Color  MEWS Score 3  MEWS Score Color Yellow

## 2024-01-03 NOTE — Plan of Care (Signed)
  Problem: Clinical Measurements: Goal: Will remain free from infection Outcome: Progressing Goal: Diagnostic test results will improve Outcome: Progressing Goal: Respiratory complications will improve Outcome: Not Progressing Goal: Cardiovascular complication will be avoided Outcome: Progressing   Problem: Activity: Goal: Risk for activity intolerance will decrease Outcome: Not Progressing   Problem: Nutrition: Goal: Adequate nutrition will be maintained Outcome: Progressing   Problem: Coping: Goal: Level of anxiety will decrease Outcome: Not Progressing   Problem: Elimination: Goal: Will not experience complications related to bowel motility Outcome: Progressing Goal: Will not experience complications related to urinary retention Outcome: Progressing

## 2024-01-03 NOTE — Progress Notes (Addendum)
 PROGRESS NOTE    Rhonda Marshall  FMW:[id number] DOB: 12-21-1950 DOA: 12/31/2023 PCP: [name] LABOR, MD    Chief Complaint  Patient presents with   Dysphagia    Brief Narrative: Patient is a 73 year old female history of chronic A-fib on Eliquis , hypertension, rheumatoid arthritis, hypothyroidism, HFrEF who admitted to the hospital with volume overload secondary to acute on chronic CHF exacerbation, severe sepsis due to multifocal pneumonia and concern for aspiration pneumonia.  Patient placed on IV antibiotics.  Patient received a dose of IV Lasix  however due to hypotension/soft blood pressure diuretics held.  Cardiology consulted.  GI also consulted due to concerns for debris in the esophagus.   Assessment & Plan:   Principal Problem:   Severe sepsis with septic shock (CODE) (HCC) Active Problems:   Acute on chronic systolic CHF (congestive heart failure) (HCC)   Sepsis due to pneumonia (HCC)   Bacteremia   Hyperthyroidism   Hypotension   AKI (acute kidney injury) (HCC)   Hyponatremia   Acute respiratory failure with hypoxia (HCC)   CKD (chronic kidney disease) stage 3, GFR 30-59 ml/min (HCC)   Dysphagia   Aspiration pneumonia of both lungs (HCC)   Bilateral pleural effusion   Lactic acidosis   Permanent atrial fibrillation (HCC)  #1 severe sepsis likely secondary to multifocal pneumonia/aspiration pneumonia and Enterococcus [name] bacteremia with septic shock/hypotension - Patient on admission met criteria for sepsis with hypothermia, tachycardia, initial lactate at 3.2 trending down with source of sepsis being multifocal/aspiration pneumonia and bacteremia. - CT neck and CT chest done concerning for debris in the esophagus as well as diffuse bilateral bronchopneumonia with bilateral lower lobe consolidation, moderate right and small left layering pleural effusions with simple fluid density favoring transudate.  Body wall anasarca noted. -.  2/4 blood cultures with  Enterococcus hirae. BCID negative. -MRSA PCR negative. - SLP following evaluated patient and patient on dysphagia 1 diet. - Continue empiric IV Zosyn . - Patient with persistent hypotension, patient in acute CHF exacerbation and as such difficulty diuresing and unable to give fluid boluses. -Repeat lactic acid level elevated but trending down, procalcitonin at 0.26 and 0.24. -Due to persistent hypotension patient placed on Levophed  drip on 01/01/2024 which is subsequently being weaned off with improvement with blood pressure. -Repeat chest x-ray on 01/02/2024 with enlarged heart with vascular congestion and edema.  Right greater than left small pleural effusions.  -Right upper quadrant ultrasound with sick gallbladder with some wall thickening and wall edema but no stones.  Wall edema and thickening is nonspecific in the presence of ascites.  No biliary ductal dilatation.  Pleural effusion.  -Patient seen in consultation by ID who are recommending TEE. -Cardiology informed of need for TEE, which was tentatively planned for 01/04/2024.  Cardiology attending feels TEE might not change her treatment as she is not likely a candidate for valve surgery with her multiple comorbidities. -PCCM was consulted and following but have signed off as of 01/02/2024. -ID following.  2.  Acute respiratory failure with hypoxia - Mild likely secondary to multifocal pneumonia as well as acute on chronic CHF exacerbation in the setting of associated pleural effusion and volume overload. - Continue treatment for pneumonia with IV antibiotics as well as heart failure. - Limited use of diuretics due to soft/low blood pressure earlier in the hospitalization. -Status post Levophed  which was started on 01/01/2024 with improvement with blood pressure. -Levophed  has been weaned off. -BP improved. -Start low-dose Lasix  20 mg IV every 12 hours and monitor  urine output. -Cardiology following.. - BiPAP as needed.  3.  Lactic  acidosis -Likely secondary to problem #1. - See problem #1.  4.  Acute on chronic HFrEF -2D echo done with a EF of 30 to 35%, global hypokinesis left ventricle, grade 2 DD, moderately dilated left atrial size, moderately dilated right atrial size, moderate MVR. - Patient noted to have received a dose of Lasix  20 mg IV x 1 on admission with urine output not recorded initially. -Patient with urine output of 800 cc over the past 24 hours. -Patient seems to be auto diuresing as due to soft/low blood pressure patient unable to be placed on IV Lasix  early on in the hospitalization. -Patient placed on pressors on 01/01/2024 with improvement with blood pressure, pressors discontinued the morning of 01/02/2024. - BP has improved and as such we will start low-dose Lasix  20 mg IV every 12 hours and monitor urine output.  Strict I's and O's. - Due to complicated cardiac history, soft/low blood pressure with difficulty diuresing in the setting of acute CHF exacerbation cardiology was consulted and are following.   - Per cardiology.  5.  Chronic hyponatremia - Patient noted to have previously been on salt tablets which was discontinued as patient was not taking diuretics of Lasix  on a daily basis. - Current course of hyponatremia likely secondary to hypervolemic hyponatremia. - Due to soft/low blood pressure unable to place on IV diuretics early on in the hospitalization.   - Hyponatremia improved since admission after receiving a dose of IV Lasix . - Continue to hold salt tablets. -Start Lasix  20 mg IV every 12 hours as blood pressure has improved and follow. - Follow.  6.  Debris in esophagus/dysphagia -Noted on CT soft tissue neck and CT chest. - Patient presented with severe sepsis with concerns for bilateral pneumonia/aspiration pneumonia. - Concern as to whether patient may actually be able to tolerate oral pills and whether they are being absorbed due to debris noted in the esophagus. - GI consulted  Home at this time does not feel patient has esophageal food impaction but likely may have a esophageal dysmotility issue. - SLP following. - Eliquis  discontinued patient placed on Lovenox  for now pending possible procedure in house. - GI recommending trial of diet recommended by SLP and if patient tolerates diet over the weekend  recommend an outpatient endoscopy once patient has recovered from pneumonia. -Patient tolerating current dysphagia 1 diet and per GI advance as recommended by SLP. - Per GI if patient vomits or unable to tolerate diet then will need to consider inpatient endoscopy at this time. - GI following and appreciate their input and recommendations.  7.  Chronic A-fib -Currently rate controlled on IV Lopressor . - Continue amiodarone . - Eliquis  held and patient placed on full dose Lovenox  for now due to problem #6 and concern for possible in-house procedure. - Cardiology consulted and following.  8.  Hyperthyroidism -Continue home regimen Tapazole . - Continue IV Lopressor . - Outpatient follow-up with primary endocrinologist.  9.  AKI on CKD stage IIIa - Baseline creatinine approximately 1.2. - Likely secondary to acute CHF exacerbation in the setting of low blood pressure. - Patient received a dose of IV Lasix  with some improvement in renal function. -Patient with urine output of 800 cc over the past 24 hours. -Renal function slowly improving. -Will place on low-dose Lasix  20 mg IV every 12 hours and monitor renal function. - Avoid nephrotoxins.  10.  Bilateral pleural effusion -Likely secondary to volume overload. -  Unable to diurese due to soft/low blood pressure early on in the hospitalization.   -BP improving. - Repeat chest x-ray done the morning of 01/02/2024, with enlarged heart with vascular congestion and edema.  Right greater than left small pleural effusions.  - Patient assessed by PCCM who currently recommending holding off on thoracentesis as bilateral  pleural effusions and ascites likely due to decompensated CHF. -Start Lasix  20 mg IV every 12 hours and monitor blood pressure closely. -- Will defer diuresis to cardiology.  11.  Hypotension/soft blood pressure -Patient with soft blood pressure/hypotension. - Likely secondary to problem #1. - Difficulty with diuresing early on in the hospitalization, due to soft blood pressure/hypotension. - Status post IV albumin  x 1.   - Cortisol level at 11.4.  - Lactic acid level was elevated at 2.8 trending back down to 2.3 and back up to 2.8 this morning.   - Procalcitonin at 0.26 and 0.24.  - Patient placed on Levophed  gtt on 01/01/2024, which has subsequently been weaned off the morning of 01/02/2024.  -BP improved. - Continue empiric IV antibiotics.  -See problem #1.  12.  Hypokalemia/hypophosphatemia -Potassium at 3.4, phosphorus at 2.4. - K-Phos 250 mg p.o. twice daily x 3 days. - KCl 40 mEq p.o. x 1. - Repeat labs in the AM.      DVT prophylaxis: Lovenox  Code Status: Full Family Communication: Updated patient.  Updated sister and daughter-in-law Tory Mckissack) at bedside. Disposition: Remain in stepdown unit .  Status is: Inpatient Remains inpatient appropriate because: Severity of illness   Consultants:  Cardiology: Dr. Michele 01/01/2024 Gastroenterology: Dr. Burnette 01/01/2024 PCCM: Dr.Joshi 01/01/2024 ID: Dr. Dennise 01/01/2024  Procedures:  CT soft tissue neck 12/31/2023 CT maxillofacial 12/31/2023 Chest x-ray 12/31/2023, 01/02/2024 2D echo 01/01/2024 CT chest abdomen and pelvis 12/31/2023 Right upper quadrant ultrasound 01/02/2024  Antimicrobials:  Anti-infectives (From admission, onward)    Start     Dose/Rate Route Frequency Ordered Stop   12/31/23 0800  piperacillin -tazobactam (ZOSYN ) IVPB 3.375 g        3.375 g 12.5 mL/hr over 240 Minutes Intravenous Every 8 hours 12/31/23 0738     12/31/23 0145  piperacillin -tazobactam (ZOSYN ) IVPB 3.375 g        3.375 g 100 mL/hr  over 30 Minutes Intravenous  Once 12/31/23 0143 12/31/23 0245         Subjective: Patient lying in bed.  States she slept well last night and able to lay flat.  Denies any chest pain or significant shortness of breath.  On 3 L nasal cannula.  States she saw cardiologist earlier on who felt that she would not require TEE tomorrow.  Noted to have been on BiPAP overnight.  Objective: Vitals:   01/03/24 0600 01/03/24 0700 01/03/24 0800 01/03/24 0847  BP: 131/83 (!) 119/92 124/89   Pulse: 95 84 87 88  Resp: (!) 26 (!) 23 (!) 24 18  Temp: (!) 97.3 F (36.3 C) (!) 97.5 F (36.4 C) 97.7 F (36.5 C) 97.7 F (36.5 C)  TempSrc:   Bladder   SpO2: 99% 100% 99% 99%  Weight:      Height:        Intake/Output Summary (Last 24 hours) at 01/03/2024 0936 Last data filed at 01/03/2024 0914 Gross per 24 hour  Intake 599.66 ml  Output 800 ml  Net -200.34 ml   Filed Weights   12/31/23 0426  Weight: 49.4 kg    Examination:  General exam: NAD. Respiratory system: Bibasilar crackles.  Scattered  diffuse coarse breath sounds.  No wheezing.  Fair air movement.  On 3 L nasal cannula with sats of 99%.  Cardiovascular system: Irregularly irregular. No JVD, murmurs, rubs, gallops or clicks.  No lower extremity edema.  Gastrointestinal system: Abdomen is soft, nontender, nondistended, positive bowel sounds.  No rebound.  No guarding Central nervous system: Alert and oriented. No focal neurological deficits. Extremities: Symmetric 5 x 5 power. Skin: No rashes, lesions or ulcers Psychiatry: Judgement and insight appear normal. Mood & affect appropriate.     Data Reviewed: I have personally reviewed following labs and imaging studies  CBC: Recent Labs  Lab 12/31/23 0108 12/31/23 2309 01/01/24 0828 01/02/24 0308 01/03/24 0218  WBC 8.0 11.5* 11.6* 7.3 6.3  NEUTROABS 5.4  --  10.5* 6.1 5.5  HGB 13.4 12.0 12.5 10.6* 11.5*  HCT 44.2 38.8 40.4 34.2* 37.1  MCV 94.4 92.2 91.2 93.4 94.2  PLT 262  183 179 166 154    Basic Metabolic Panel: Recent Labs  Lab 12/31/23 1552 12/31/23 2309 01/01/24 0828 01/02/24 0308 01/03/24 0218  NA 130* 131* 133* 131* 132*  K 5.2* 4.6 4.6 3.5 3.4*  CL 95* 96* 96* 93* 96*  CO2 25 26 25 26 24   GLUCOSE 74 83 72 157* 145*  BUN 31* 31* 29* 27* 23  CREATININE 1.65* 1.61* 1.45* 1.27* 1.06*  CALCIUM 8.8* 8.8* 9.0 8.8* 9.1  MG  --   --  1.9 1.7 1.7  PHOS  --   --   --  2.6 2.4*    GFR: Estimated Creatinine Clearance: 36.3 mL/min (A) (by C-G formula based on SCr of 1.06 mg/dL (H)).  Liver Function Tests: Recent Labs  Lab 01/01/24 0828 01/02/24 0308 01/03/24 0218  AST 43* 33 36  ALT 36 26 27  ALKPHOS 174* 152* 161*  BILITOT 2.1* 1.7* 2.0*  PROT 6.3* 6.1* 6.2*  ALBUMIN  2.4* 3.0* 2.7*    CBG: No results for input(s): GLUCAP in the last 168 hours.   Recent Results (from the past 240 hours)  Blood culture (routine x 2)     Status: Abnormal   Collection Time: 12/31/23  2:00 AM   Specimen: BLOOD  Result Value Ref Range Status   Specimen Description   Final    BLOOD LEFT ANTECUBITAL Performed at [facility], [address] [place], [place] [address]    Special Requests   Final    BOTTLES DRAWN AEROBIC AND ANAEROBIC Blood Culture results may not be optimal due to an inadequate volume of blood received in culture bottles Performed at [facility], [address] [place], [place] [address]    Culture  Setup Time   Final    GRAM POSITIVE COCCI IN PAIRS IN CHAINS IN BOTH AEROBIC AND ANAEROBIC BOTTLES CRITICAL RESULT CALLED TO, READ BACK BY AND VERIFIED WITH: PHARMD EMERSON MILLET [id number] @ 2212 FH    Culture (A)  Final    ENTEROCOCCUS [name] STAPHYLOCOCCUS EPIDERMIDIS THE SIGNIFICANCE OF ISOLATING THIS ORGANISM FROM A SINGLE SET OF BLOOD CULTURES WHEN MULTIPLE SETS ARE DRAWN IS UNCERTAIN. PLEASE NOTIFY THE MICROBIOLOGY DEPARTMENT WITHIN ONE WEEK IF SPECIATION AND SENSITIVITIES ARE  REQUIRED. Performed at [facility], [address], [place], [place] [address]    Report Status 01/02/2024 FINAL  Final   Organism ID, Bacteria ENTEROCOCCUS [name]  Final      Susceptibility   Enterococcus hirae - MIC*    AMPICILLIN <=2 SENSITIVE Sensitive     VANCOMYCIN  <=  0.5 SENSITIVE Sensitive     GENTAMICIN SYNERGY SENSITIVE Sensitive     * ENTEROCOCCUS [name]  Blood Culture ID Panel (Reflexed)     Status: None   Collection Time: 12/31/23  2:00 AM  Result Value Ref Range Status   Enterococcus faecalis NOT DETECTED NOT DETECTED Final   Enterococcus Faecium NOT DETECTED NOT DETECTED Final   Listeria monocytogenes NOT DETECTED NOT DETECTED Final   Staphylococcus species NOT DETECTED NOT DETECTED Final   Staphylococcus aureus (BCID) NOT DETECTED NOT DETECTED Final   Staphylococcus epidermidis NOT DETECTED NOT DETECTED Final   Staphylococcus lugdunensis NOT DETECTED NOT DETECTED Final   Streptococcus species NOT DETECTED NOT DETECTED Final   Streptococcus agalactiae NOT DETECTED NOT DETECTED Final   Streptococcus pneumoniae NOT DETECTED NOT DETECTED Final   Streptococcus pyogenes NOT DETECTED NOT DETECTED Final   A.calcoaceticus-baumannii NOT DETECTED NOT DETECTED Final   Bacteroides fragilis NOT DETECTED NOT DETECTED Final   Enterobacterales NOT DETECTED NOT DETECTED Final   Enterobacter cloacae complex NOT DETECTED NOT DETECTED Final   Escherichia coli NOT DETECTED NOT DETECTED Final   Klebsiella aerogenes NOT DETECTED NOT DETECTED Final   Klebsiella oxytoca NOT DETECTED NOT DETECTED Final   Klebsiella pneumoniae NOT DETECTED NOT DETECTED Final   Proteus species NOT DETECTED NOT DETECTED Final   Salmonella species NOT DETECTED NOT DETECTED Final   Serratia marcescens NOT DETECTED NOT DETECTED Final   Haemophilus influenzae NOT DETECTED NOT DETECTED Final   Neisseria meningitidis NOT DETECTED NOT DETECTED Final   Pseudomonas aeruginosa NOT DETECTED NOT DETECTED Final    Stenotrophomonas maltophilia NOT DETECTED NOT DETECTED Final   Candida albicans NOT DETECTED NOT DETECTED Final   Candida auris NOT DETECTED NOT DETECTED Final   Candida glabrata NOT DETECTED NOT DETECTED Final   Candida krusei NOT DETECTED NOT DETECTED Final   Candida parapsilosis NOT DETECTED NOT DETECTED Final   Candida tropicalis NOT DETECTED NOT DETECTED Final   Cryptococcus neoformans/gattii NOT DETECTED NOT DETECTED Final    Comment: Performed at [facility], [address] [place], [place] [address]  Blood culture (routine x 2)     Status: None (Preliminary result)   Collection Time: 12/31/23  2:15 AM   Specimen: BLOOD  Result Value Ref Range Status   Specimen Description   Final    BLOOD RIGHT ANTECUBITAL Performed at [facility], [address] [place], [place] [address]    Special Requests   Final    BOTTLES DRAWN AEROBIC AND ANAEROBIC Blood Culture adequate volume Performed at [facility], [address], [place], [place] [address]    Culture   Final    NO GROWTH 3 DAYS Performed at [facility], [address] [place], [place] [address]    Report Status PENDING  Incomplete  MRSA Next Gen by PCR, Nasal     Status: None   Collection Time: 12/31/23  9:14 AM   Specimen: Nasal Mucosa; Nasal Swab  Result Value Ref Range Status   MRSA by PCR Next Gen NOT DETECTED NOT DETECTED Final    Comment: (NOTE) The GeneXpert MRSA Assay (FDA approved for NASAL specimens only), is one component of a comprehensive MRSA colonization surveillance program. It is not intended to diagnose MRSA infection nor to guide or monitor treatment for MRSA infections. Test performance is not FDA approved in patients less than 83 years old. Performed at [facility], [address], [place], [place] [address]  Urine Culture (for pregnant, neutropenic or urologic patients or patients with an indwelling urinary catheter)      Status: None   Collection Time: 01/01/24 10:37 AM   Specimen: Urine, Catheterized  Result Value Ref Range Status   Specimen Description   Final    URINE, CATHETERIZED Performed at [facility], [address] [place], [place] [address]    Special Requests   Final    NONE Performed at [facility], [address], [place], [place] [address]    Culture   Final    NO GROWTH Performed at [facility], [address], [place], [place] [address]    Report Status 01/02/2024 FINAL  Final  Culture, blood (Routine X 2) w Reflex to ID Panel     Status: None (Preliminary result)   Collection Time: 01/02/24  7:26 AM   Specimen: BLOOD LEFT HAND  Result Value Ref Range Status   Specimen Description   Final    BLOOD LEFT HAND Performed at [facility], [address] [place], [place] [address]    Special Requests   Final    BOTTLES DRAWN AEROBIC ONLY Blood Culture results may not be optimal due to an inadequate volume of blood received in culture bottles Performed at [facility], [address] [place], [place] [address]    Culture   Final    NO GROWTH < 24 HOURS Performed at [facility], [address], [place], [place] [address]    Report Status PENDING  Incomplete  Culture, blood (Routine X 2) w Reflex to ID Panel     Status: None (Preliminary result)   Collection Time: 01/02/24  7:32 AM   Specimen: BLOOD RIGHT ARM  Result Value Ref Range Status   Specimen Description   Final    BLOOD RIGHT ARM Performed at [facility], [address] [place], [place] [address]    Special Requests   Final    BOTTLES DRAWN AEROBIC ONLY Blood Culture results may not be optimal due to an inadequate volume of blood received in culture bottles Performed at [facility], [address] [place], [place] [address]    Culture   Final    NO GROWTH < 24 HOURS Performed at [facility], [address] [place], [place] [address]    Report Status PENDING  Incomplete         Radiology Studies: DG Chest 1 View Result Date: 01/02/2024 CLINICAL DATA:  Severe sepsis EXAM: CHEST  1 VIEW COMPARISON:  X-ray and CT scan 12/31/2023. FINDINGS: Pleural effusions are again seen. Enlarged cardiopericardial silhouette with calcified aorta. Vascular congestion interstitial edema identified. No consolidation pneumothorax. Kyphotic x-ray obscures the apices more left than right as there is some leftward rotation of the x-ray. Overlapping cardiac leads. IMPRESSION: Enlarged heart with vascular congestion and edema. Right greater than left small pleural effusions Electronically Signed   By: Ranell Bring M.D.   On: 01/02/2024 10:04   US  Abdomen Limited RUQ (LIVER/GB) Result Date: 01/02/2024 CLINICAL DATA:  Severe sepsis EXAM: ULTRASOUND ABDOMEN LIMITED RIGHT UPPER QUADRANT COMPARISON:  Ultrasound 03/10/2023 limited.  CT 12/31/2023. FINDINGS: Gallbladder: Distended gallbladder. There is some wall thickening and wall edema. No shadowing stones or reported Murphy's sign. Common bile duct: Diameter: 3 mm Liver: No focal lesion identified. Within normal limits in parenchymal echogenicity. Portal vein is patent on color Doppler imaging with normal direction  of blood flow towards the liver. Other: Scattered ascites.  Pleural effusion. IMPRESSION: Sick gallbladder with some wall thickening and wall edema but no stones. The wall edema and thickening is nonspecific in the presence of ascites. No biliary ductal dilatation. Pleural effusion. Electronically Signed   By: Ranell Bring M.D.   On: 01/02/2024 10:00   ECHOCARDIOGRAM COMPLETE Result Date: 01/01/2024    ECHOCARDIOGRAM REPORT   Patient Name:   FRANCENIA CHIMENTI Date of Exam: 01/01/2024 Medical Rec #:  [id number]        Height:       62.0 in Accession #:    [id number]       Weight:       109.0 lb Date of Birth:  1950-09-20        BSA:          1.477 m Patient Age:    73  years         BP:           129/00 mmHg Patient Gender: F                HR:           85 bpm. Exam Location:  Inpatient Procedure: 2D Echo, Color Doppler and Cardiac Doppler (Both Spectral and Color            Flow Doppler were utilized during procedure). Indications:    I50.21 Acute systolic (congestive) heart failure  History:        Patient has prior history of Echocardiogram examinations, most                 recent 03/30/2023.  Sonographer:    Eva Lash Referring Phys: [id number] Epifania Littrell V Kalene Cutler IMPRESSIONS  1. Left ventricular ejection fraction, by estimation, is 30 to 35%. The left ventricle has moderately decreased function. The left ventricle demonstrates global hypokinesis. The left ventricular internal cavity size was moderately dilated. Left ventricular diastolic parameters are consistent with Grade II diastolic dysfunction (pseudonormalization). Elevated left ventricular end-diastolic pressure.  2. Right ventricular systolic function is normal. The right ventricular size is normal.  3. Left atrial size was moderately dilated.  4. Right atrial size was moderately dilated.  5. The mitral valve is abnormal. Moderate mitral valve regurgitation. No evidence of mitral stenosis.  6. Tricuspid valve regurgitation is moderate.  7. The aortic valve is normal in structure. There is moderate calcification of the aortic valve. There is moderate thickening of the aortic valve. Aortic valve regurgitation is not visualized. Aortic valve sclerosis is present, with no evidence of aortic valve stenosis.  8. The inferior vena cava is dilated in size with >50% respiratory variability, suggesting right atrial pressure of 8 mmHg. FINDINGS  Left Ventricle: Left ventricular ejection fraction, by estimation, is 30 to 35%. The left ventricle has moderately decreased function. The left ventricle demonstrates global hypokinesis. Definity  contrast agent was given IV to delineate the left ventricular endocardial borders. Strain was  performed and the global longitudinal strain is indeterminate. The left ventricular internal cavity size was moderately dilated. There is no left ventricular hypertrophy. Left ventricular diastolic parameters are consistent with Grade II diastolic dysfunction (pseudonormalization). Elevated left ventricular end-diastolic pressure. Right Ventricle: The right ventricular size is normal. No increase in right ventricular wall thickness. Right ventricular systolic function is normal. Left Atrium: Left atrial size was moderately dilated. Right Atrium: Right atrial size was moderately dilated. Pericardium: There is no evidence of pericardial effusion. Mitral Valve: The mitral valve  is abnormal. There is mild thickening of the mitral valve leaflet(s). Moderate mitral valve regurgitation. No evidence of mitral valve stenosis. Tricuspid Valve: The tricuspid valve is normal in structure. Tricuspid valve regurgitation is moderate . No evidence of tricuspid stenosis. Aortic Valve: The aortic valve is normal in structure. There is moderate calcification of the aortic valve. There is moderate thickening of the aortic valve. Aortic valve regurgitation is not visualized. Aortic valve sclerosis is present, with no evidence of aortic valve stenosis. Aortic valve mean gradient measures 4.0 mmHg. Aortic valve peak gradient measures 7.2 mmHg. Aortic valve area, by VTI measures 1.81 cm. Pulmonic Valve: The pulmonic valve was normal in structure. Pulmonic valve regurgitation is mild. No evidence of pulmonic stenosis. Aorta: The aortic root is normal in size and structure. Venous: The inferior vena cava is dilated in size with greater than 50% respiratory variability, suggesting right atrial pressure of 8 mmHg. IAS/Shunts: No atrial level shunt detected by color flow Doppler. Additional Comments: 3D was performed not requiring image post processing on an independent workstation and was indeterminate.  LEFT VENTRICLE PLAX 2D LVIDd:          4.50 cm      Diastology LVIDs:         3.90 cm      LV e' lateral:   6.64 cm/s LV PW:         1.10 cm      LV E/e' lateral: 18.7 LV IVS:        0.90 cm LVOT diam:     2.00 cm LV SV:         33 LV SV Index:   23 LVOT Area:     3.14 cm  LV Volumes (MOD) LV vol d, MOD A2C: 157.0 ml LV vol d, MOD A4C: 148.0 ml LV vol s, MOD A2C: 99.7 ml LV vol s, MOD A4C: 104.0 ml LV SV MOD A2C:     57.3 ml LV SV MOD A4C:     148.0 ml LV SV MOD BP:      50.6 ml RIGHT VENTRICLE TAPSE (M-mode): 1.0 cm LEFT ATRIUM             Index LA diam:        4.40 cm 2.98 cm/m LA Vol (A2C):   82.0 ml 55.52 ml/m LA Vol (A4C):   70.2 ml 47.53 ml/m LA Biplane Vol: 76.6 ml 51.86 ml/m  AORTIC VALVE AV Area (Vmax):    2.03 cm AV Area (Vmean):   1.74 cm AV Area (VTI):     1.81 cm AV Vmax:           134.00 cm/s AV Vmean:          89.800 cm/s AV VTI:            0.184 m AV Peak Grad:      7.2 mmHg AV Mean Grad:      4.0 mmHg LVOT Vmax:         86.60 cm/s LVOT Vmean:        49.800 cm/s LVOT VTI:          0.106 m LVOT/AV VTI ratio: 0.58  AORTA Ao Asc diam: 3.50 cm MITRAL VALVE                TRICUSPID VALVE MV Area (PHT): 5.95 cm     TR Peak grad:   36.5 mmHg MR Peak grad: 92.9 mmHg     TR Vmax:  302.00 cm/s MR Mean grad: 56.0 mmHg MR Vmax:      482.00 cm/s   SHUNTS MR Vmean:     349.0 cm/s    Systemic VTI:  0.11 m MV E velocity: 124.00 cm/s  Systemic Diam: 2.00 cm MV A velocity: 39.20 cm/s MV E/A ratio:  3.16 Maude Emmer MD Electronically signed by Maude Emmer MD Signature Date/Time: 01/01/2024/2:35:36 PM    Final         Scheduled Meds:  amiodarone   100 mg Oral Daily   Chlorhexidine  Gluconate Cloth  6 each Topical Daily   enoxaparin  (LOVENOX ) injection  50 mg Subcutaneous Q24H   feeding supplement  237 mL Oral BID BM   leptospermum manuka honey  1 Application Topical Daily   methimazole   2.5 mg Oral Daily   metoprolol  tartrate  5 mg Intravenous Q6H   phosphorus  250 mg Oral BID   Continuous Infusions:  sodium chloride       norepinephrine  (LEVOPHED ) Adult infusion Stopped (01/02/24 0223)   piperacillin -tazobactam (ZOSYN )  IV 12.5 mL/hr at 01/03/24 0914     LOS: 3 days    Time spent: 40 minutes    Toribio Hummer, MD [facility]   To contact the attending provider between 7A-7P or the covering provider during after hours 7P-7A, please log into the web site [email or web address] and access using universal [place] password for that web site. If you do not have the password, please call the hospital operator.  01/03/2024, 9:36 AM

## 2024-01-03 NOTE — Progress Notes (Signed)
 OT Cancellation Note  Patient Details Name: Rhonda Marshall MRN: [id number] DOB: 1951/04/10   Cancelled Treatment:    Reason Eval/Treat Not Completed: Medical issues which prohibited therapy Patient is currently on BiPAP and had rough night per nurse. OT to continue to follow and check back as schedule will allow.  Geofm LEYLAND, MS Acute Rehabilitation Department Office# [phone]  01/03/2024, 8:20 AM

## 2024-01-04 DIAGNOSIS — B9689 Other specified bacterial agents as the cause of diseases classified elsewhere: Secondary | ICD-10-CM

## 2024-01-04 DIAGNOSIS — E871 Hypo-osmolality and hyponatremia: Secondary | ICD-10-CM | POA: Diagnosis not present

## 2024-01-04 DIAGNOSIS — R7881 Bacteremia: Secondary | ICD-10-CM | POA: Diagnosis not present

## 2024-01-04 DIAGNOSIS — I5023 Acute on chronic systolic (congestive) heart failure: Secondary | ICD-10-CM | POA: Diagnosis not present

## 2024-01-04 DIAGNOSIS — J690 Pneumonitis due to inhalation of food and vomit: Secondary | ICD-10-CM | POA: Diagnosis not present

## 2024-01-04 DIAGNOSIS — N183 Chronic kidney disease, stage 3 unspecified: Secondary | ICD-10-CM

## 2024-01-04 DIAGNOSIS — B952 Enterococcus as the cause of diseases classified elsewhere: Secondary | ICD-10-CM | POA: Diagnosis not present

## 2024-01-04 DIAGNOSIS — I4821 Permanent atrial fibrillation: Secondary | ICD-10-CM | POA: Diagnosis not present

## 2024-01-04 DIAGNOSIS — R6521 Severe sepsis with septic shock: Secondary | ICD-10-CM | POA: Diagnosis not present

## 2024-01-04 DIAGNOSIS — J189 Pneumonia, unspecified organism: Secondary | ICD-10-CM | POA: Diagnosis not present

## 2024-01-04 DIAGNOSIS — J180 Bronchopneumonia, unspecified organism: Secondary | ICD-10-CM | POA: Diagnosis not present

## 2024-01-04 DIAGNOSIS — J9601 Acute respiratory failure with hypoxia: Secondary | ICD-10-CM | POA: Diagnosis not present

## 2024-01-04 LAB — CBC WITH DIFFERENTIAL/PLATELET
Abs Immature Granulocytes: 0.04 10*3/uL (ref 0.00–0.07)
Basophils Absolute: 0.1 10*3/uL (ref 0.0–0.1)
Basophils Relative: 1 %
Eosinophils Absolute: 0.2 10*3/uL (ref 0.0–0.5)
Eosinophils Relative: 4 %
HCT: 38.5 % (ref 36.0–46.0)
Hemoglobin: 11.6 g/dL — ABNORMAL LOW (ref 12.0–15.0)
Immature Granulocytes: 1 %
Lymphocytes Relative: 15 %
Lymphs Abs: 0.9 10*3/uL (ref 0.7–4.0)
MCH: 28.1 pg (ref 26.0–34.0)
MCHC: 30.1 g/dL (ref 30.0–36.0)
MCV: 93.2 fL (ref 80.0–100.0)
Monocytes Absolute: 0.6 10*3/uL (ref 0.1–1.0)
Monocytes Relative: 10 %
Neutro Abs: 4.3 10*3/uL (ref 1.7–7.7)
Neutrophils Relative %: 69 %
Platelets: 152 10*3/uL (ref 150–400)
RBC: 4.13 MIL/uL (ref 3.87–5.11)
RDW: 18.8 % — ABNORMAL HIGH (ref 11.5–15.5)
WBC: 6.1 10*3/uL (ref 4.0–10.5)
nRBC: 0 % (ref 0.0–0.2)

## 2024-01-04 LAB — RENAL FUNCTION PANEL
Albumin: 2.2 g/dL — ABNORMAL LOW (ref 3.5–5.0)
Anion gap: 10 (ref 5–15)
BUN: 25 mg/dL — ABNORMAL HIGH (ref 8–23)
CO2: 27 mmol/L (ref 22–32)
Calcium: 8.6 mg/dL — ABNORMAL LOW (ref 8.9–10.3)
Chloride: 99 mmol/L (ref 98–111)
Creatinine, Ser: 1.02 mg/dL — ABNORMAL HIGH (ref 0.44–1.00)
GFR, Estimated: 58 mL/min — ABNORMAL LOW (ref >60)
Glucose, Bld: 88 mg/dL (ref 70–99)
Phosphorus: 2.8 mg/dL (ref 2.5–4.6)
Potassium: 3.7 mmol/L (ref 3.5–5.1)
Sodium: 136 mmol/L (ref 135–145)

## 2024-01-04 LAB — PROCALCITONIN: Procalcitonin: 0.32 ng/mL

## 2024-01-04 LAB — MAGNESIUM: Magnesium: 2 mg/dL (ref 1.7–2.4)

## 2024-01-04 LAB — T3: T3, Total: 60 ng/dL — ABNORMAL LOW (ref 71–180)

## 2024-01-04 MED ORDER — SODIUM CHLORIDE 0.9 % IV SOLN
2.0000 g | Freq: Two times a day (BID) | INTRAVENOUS | Status: DC
  Administered 2024-01-04 – 2024-01-08 (×9): 2 g via INTRAVENOUS
  Filled 2024-01-04 (×9): qty 20

## 2024-01-04 MED ORDER — TRAMADOL HCL 50 MG PO TABS
50.0000 mg | ORAL_TABLET | Freq: Four times a day (QID) | ORAL | Status: DC | PRN
  Administered 2024-01-04 – 2024-01-06 (×4): 50 mg via ORAL
  Filled 2024-01-04 (×4): qty 1

## 2024-01-04 MED ORDER — SODIUM CHLORIDE 0.9 % IV SOLN
2.0000 g | Freq: Four times a day (QID) | INTRAVENOUS | Status: DC
  Administered 2024-01-04 – 2024-01-08 (×17): 2 g via INTRAVENOUS
  Filled 2024-01-04 (×20): qty 2000

## 2024-01-04 MED ORDER — POTASSIUM CHLORIDE 20 MEQ PO PACK
40.0000 meq | PACK | Freq: Once | ORAL | Status: AC
  Administered 2024-01-04: 40 meq via ORAL
  Filled 2024-01-04: qty 2

## 2024-01-04 NOTE — Evaluation (Signed)
 Occupational Therapy Evaluation Patient Details Name: Rhonda Marshall MRN: [id number] DOB: February 14, 1951 Today's Date: 01/04/2024   History of Present Illness   Patient is a 73 year old female who presented on 12/31/23 with volume overload due to acute on chronic CHF, severe sepsis due to multifocal pneumonia. patient was also found to have occult radial head fracture on L side. patient declined to have another xray cvompleted on R side but patietn reporting R side is the one fractured.PMH: prediabetes, permanent a fib, hypokalemia., HTN,RA, hypothyroidism     Clinical Impressions Patient is a 73 year old female who was admitted for above. Patient was living at home with son prior level with RW and some w/c use per patient. Patient was confused during session reporting that she had fracture on R elbow when it was L side that has imaging completed with fracture noted. No restrictions listed for this fracture in chart at this time. Patient was noted to have decreased functional activity tolerance, decreased endurance, decreased standing balance, decreased safety awareness, and decreased knowledge of AD/AE impacting participation in ADLs. Patient would continue to benefit from skilled OT services at this time while admitted and after d/c to address noted deficits in order to improve overall safety and independence in ADLs.       If plan is discharge home, recommend the following:   A lot of help with bathing/dressing/bathroom;Assistance with cooking/housework;Direct supervision/assist for medications management;Assist for transportation;Help with stairs or ramp for entrance;Direct supervision/assist for financial management;A little help with walking and/or transfers     Functional Status Assessment   Patient has had a recent decline in their functional status and demonstrates the ability to make significant improvements in function in a reasonable and predictable amount of time.     Equipment  Recommendations   None recommended by OT      Precautions/Restrictions   Precautions Precautions: Fall Precaution/Restrictions Comments: L side occult radial head fracutre. Restrictions Weight Bearing Restrictions Per Provider Order: No     Mobility Bed Mobility Overal bed mobility: Needs Assistance Bed Mobility: Supine to Sit     Supine to sit: Contact guard     General bed mobility comments: extra time, able to slowly sit up, encouraged no weight through right UE to scoot, able to shift and scoot         Balance Overall balance assessment: Mild deficits observed, not formally tested                                         ADL either performed or assessed with clinical judgement   ADL Overall ADL's : Needs assistance/impaired Eating/Feeding: Set up;Sitting Eating/Feeding Details (indicate cue type and reason): opening small containers. Grooming: Sitting;Brushing hair;Supervision/safety Grooming Details (indicate cue type and reason): cues to avoid cuts on forehead. Upper Body Bathing: Sitting;Minimal assistance   Lower Body Bathing: Sitting/lateral leans;Maximal assistance Lower Body Bathing Details (indicate cue type and reason): attempted to get R sock on but unable to keep leg up on lap to get sock on. Upper Body Dressing : Sitting;Set up;Supervision/safety   Lower Body Dressing: Maximal assistance;Sit to/from stand   Toilet Transfer: Minimal assistance;+2 for safety/equipment;+2 for physical assistance   Toileting- Clothing Manipulation and Hygiene: Maximal assistance;Sit to/from stand               Vision   Vision Assessment?: No apparent visual deficits  Pertinent Vitals/Pain Pain Assessment Pain Assessment: No/denies pain     Extremity/Trunk Assessment Upper Extremity Assessment Upper Extremity Assessment: Right hand dominant;LUE deficits/detail;RUE deficits/detail RUE Deficits / Details: noted to have  dressing on posterior elbow. reports that this one is the one that hurts. no imaging in chart noted to hhave declined x-ray on this side in notes. LUE Deficits / Details: has a radial head fracture but reports that it was the R side. no ROM deficits observed. h/o RA   Lower Extremity Assessment Lower Extremity Assessment: Defer to PT evaluation   Cervical / Trunk Assessment Cervical / Trunk Assessment: Kyphotic   Communication Communication Communication: Impaired Factors Affecting Communication: Hearing impaired   Cognition Arousal: Alert Behavior During Therapy: WFL for tasks assessed/performed Cognition: Cognition impaired             OT - Cognition Comments: h/o poor safety awareness but otherwise appropriate.                 Following commands: Intact                  Home Living Family/patient expects to be discharged to:: Private residence Living Arrangements: Children Available Help at Discharge: Family Type of Home: House Home Access: Stairs to enter Secretary/administrator of Steps: 3 Entrance Stairs-Rails: Can reach both Home Layout: Two level;Able to live on main level with bedroom/bathroom Alternate Level Stairs-Number of Steps: her bedroom is on the main level   Bathroom Shower/Tub: Producer, television/film/video: Standard     Home Equipment: Pharmacist, hospital (2 wheels);BSC/3in1   Additional Comments: sons assist      Prior Functioning/Environment Prior Level of Function : Independent/Modified Independent             Mobility Comments: ind no AD., uses rollator  PRN ADLs Comments: ind    OT Problem List: Decreased activity tolerance;Impaired balance (sitting and/or standing);Decreased safety awareness;Decreased knowledge of precautions;Decreased knowledge of use of DME or AE   OT Treatment/Interventions: Self-care/ADL training;DME and/or AE instruction;Therapeutic activities;Balance training;Energy  conservation;Patient/family education      OT Goals(Current goals can be found in the care plan section)   Acute Rehab OT Goals Patient Stated Goal: to go home OT Goal Formulation: With patient Time For Goal Achievement: 01/18/24 Potential to Achieve Goals: Fair   OT Frequency:  Min 2X/week    Co-evaluation PT/OT/SLP Co-Evaluation/Treatment: Yes Reason for Co-Treatment: For patient/therapist safety PT goals addressed during session: Mobility/safety with mobility OT goals addressed during session: ADL's and self-care      AM-PAC OT 6 Clicks Daily Activity     Outcome Measure Help from another person eating meals?: A Little Help from another person taking care of personal grooming?: A Little Help from another person toileting, which includes using toliet, bedpan, or urinal?: A Lot Help from another person bathing (including washing, rinsing, drying)?: A Lot Help from another person to put on and taking off regular upper body clothing?: A Little Help from another person to put on and taking off regular lower body clothing?: A Lot 6 Click Score: 15   End of Session Equipment Utilized During Treatment: Gait belt;Rolling walker (2 wheels);Oxygen Nurse Communication: Mobility status  Activity Tolerance: Patient tolerated treatment well Patient left: in chair;with call bell/phone within reach;with chair alarm set  OT Visit Diagnosis: Unsteadiness on feet (R26.81);Other abnormalities of gait and mobility (R26.89);History of falling (Z91.81)  Time: 9097-9075 OT Time Calculation (min): 22 min Charges:  OT General Charges $OT Visit: 1 Visit OT Evaluation $OT Eval Low Complexity: 1 Low  Vearl Aitken OTR/L, MS Acute Rehabilitation Department Office# [phone]   Geofm CHRISTELLA Dance 01/04/2024, 2:50 PM

## 2024-01-04 NOTE — Plan of Care (Signed)
   Problem: Education: Goal: Knowledge of General Education information will improve Description Including pain rating scale, medication(s)/side effects and non-pharmacologic comfort measures Outcome: Progressing   Problem: Health Behavior/Discharge Planning: Goal: Ability to manage health-related needs will improve Outcome: Progressing

## 2024-01-04 NOTE — Progress Notes (Signed)
  Progress Note  Patient Name: Rhonda Marshall Date of Encounter: 01/04/2024 [facility] Cardiologist: Lynwood Schilling, MD   Interval Summary   Hurts all over. No CV complaints.  Vital Signs Vitals:   01/04/24 0700 01/04/24 0800 01/04/24 0803 01/04/24 0900  BP: 116/71 (!) 136/92  116/88  Pulse: 81 (!) 103  91  Resp: (!) 22 18  (!) 24  Temp: 98.1 F (36.7 C) 98.1 F (36.7 C) (!) 96.9 F (36.1 C) 98.2 F (36.8 C)  TempSrc:   Axillary   SpO2: 99% 98%  100%  Weight:      Height:        Intake/Output Summary (Last 24 hours) at 01/04/2024 1015 Last data filed at 01/04/2024 0806 Gross per 24 hour  Intake 325.65 ml  Output 2450 ml  Net -2124.35 ml      01/04/2024    5:00 AM 01/03/2024    9:55 AM 12/31/2023    4:26 AM  Last 3 Weights  Weight (lbs) 111 lb 5.3 oz 112 lb 10.5 oz 109 lb  Weight (kg) 50.5 kg 51.1 kg 49.442 kg      Telemetry/ECG  AFib, rate controlled - Personally Reviewed  Physical Exam  GEN: No acute distress.   Neck: No JVD Cardiac: irregular, no murmurs, rubs, or gallops.  Respiratory: Clear to auscultation bilaterally. GI: Soft, nontender, non-distended  MS: No edema  Assessment & Plan  73 yo woman with sepsis syndrome and E. 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 Developed volume overload after treatment for sepsis, but now with good response to diuretics.  Improving rapidly. Hemodynamically stable, afebrile, WBC normalized. Transthoracic echo does not show vegetations and the images are pretty good quality. 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  A big part of her LV dysfunction is severe systolic dyssynchrony due to LBBB with QRS duration of about 150 ms. Suspicion for endocarditis  is lower considering the rapid clinical improvement and TEE is a risky proposition considering her comorbidities, particularly the risk for aspiration and esophageal injury in the setting of previous XRT. Would treat with antibiotics, no TEE. Once infection is definitely cleared, consider outpatient evaluation for CRT-P.   For questions or updates, please contact [facility] Please consult [email or web address] for contact info under       Signed, Jerel Balding, MD

## 2024-01-04 NOTE — Progress Notes (Addendum)
 PROGRESS NOTE    Rhonda Marshall  FMW:[id number] DOB: 07-10-1951 DOA: 12/31/2023 PCP: [name] LABOR, MD    Chief Complaint  Patient presents with   Dysphagia    Brief Narrative: Patient is a 73 year old female history of chronic A-fib on Eliquis , hypertension, rheumatoid arthritis, hypothyroidism, HFrEF who admitted to the hospital with volume overload secondary to acute on chronic CHF exacerbation, severe sepsis due to multifocal pneumonia and concern for aspiration pneumonia.  Patient placed on IV antibiotics.  Patient received a dose of IV Lasix  however due to hypotension/soft blood pressure diuretics held.  Cardiology consulted.  GI also consulted due to concerns for debris in the esophagus.  ID consulted patient maintained on IV antibiotics likely will require 6 weeks of IV antibiotics to cover for empiric endocarditis as well.   Assessment & Plan:   Principal Problem:   Severe sepsis with septic shock (CODE) (HCC) Active Problems:   Acute on chronic systolic CHF (congestive heart failure) (HCC)   Sepsis due to pneumonia (HCC)   Bacteremia   Hyperthyroidism   Hypotension   AKI (acute kidney injury) (HCC)   Hyponatremia   Acute respiratory failure with hypoxia (HCC)   CKD (chronic kidney disease) stage 3, GFR 30-59 ml/min (HCC)   Dysphagia   Aspiration pneumonia of both lungs (HCC)   Bilateral pleural effusion   Lactic acidosis   Permanent atrial fibrillation (HCC)   Lethargy  #1 severe sepsis likely secondary to multifocal pneumonia/aspiration pneumonia and Enterococcus [name] bacteremia with septic shock/hypotension - Patient on admission met criteria for sepsis with hypothermia, tachycardia, initial lactate at 3.2 trending down with source of sepsis being multifocal/aspiration pneumonia and bacteremia. - CT neck and CT chest done concerning for debris in the esophagus as well as diffuse bilateral bronchopneumonia with bilateral lower lobe consolidation, moderate  right and small left layering pleural effusions with simple fluid density favoring transudate.  Body wall anasarca noted. -.  2/4 blood cultures with Enterococcus hirae. BCID negative. -MRSA PCR negative. -Repeat blood cultures ordered with no growth to date. - SLP following evaluated patient and patient on dysphagia 1 diet. - Continue empiric IV Zosyn . - Patient with persistent hypotension early on in the hospitalization, patient in acute CHF exacerbation and as such difficulty diuresing and unable to give fluid boluses. -Repeat lactic acid level elevated but trending down, procalcitonin at 0.26 and 0.24. -Due to persistent hypotension patient placed on Levophed  drip on 01/01/2024 which is subsequently being weaned off with improvement with blood pressure. -Repeat chest x-ray on 01/02/2024 with enlarged heart with vascular congestion and edema.  Right greater than left small pleural effusions.  -Right upper quadrant ultrasound with sick gallbladder with some wall thickening and wall edema but no stones.  Wall edema and thickening is nonspecific in the presence of ascites.  No biliary ductal dilatation.  Pleural effusion.  -Patient seen in consultation by ID who are recommending TEE. -Cardiology informed of need for TEE, which was initially planned for 01/04/2024 however canceled by cardiology as per cardiology might not change her treatment as she is not likely a candidate for valve surgery with her multiple comorbidities and recommending 6 weeks of IV antibiotics. - Patient being followed by ID who are recommending continuation of IV Zosyn  and transitioning to ampicillin and Rocephin  hopefully today and recommending 6-week course of empiric IV antibiotics to cover for empiric endocarditis. - Per ID will need colonoscopy in the outpatient setting as source of bacteremia remains unclear.. -PCCM was consulted and following but  have signed off as of 01/02/2024. -ID following and appreciate input and  recommendations..  2.  Acute respiratory failure with hypoxia - Mild likely secondary to multifocal pneumonia as well as acute on chronic CHF exacerbation in the setting of associated pleural effusion and volume overload. - Continue treatment for pneumonia with IV antibiotics as well as heart failure. - Limited use of diuretics early on in the hospitalization due to soft/low blood pressure. -Status post Levophed  which was started on 01/01/2024 with improvement with blood pressure. -Levophed  has been weaned off. -BP improved. - Continue low-dose Lasix  20 mg IV every 12 hours.   - Wean O2.   - Cardiology following.   - BiPAP as needed.   3.  Lactic acidosis -Likely secondary to problem #1. - See problem #1.  4.  Acute on chronic HFrEF -2D echo done with a EF of 30 to 35%, global hypokinesis left ventricle, grade 2 DD, moderately dilated left atrial size, moderately dilated right atrial size, moderate MVR. - Patient noted to have received a dose of Lasix  20 mg IV x 1 on admission with urine output not recorded initially. -Patient with urine output of 2.225 L over the past 24 hours. -Patient placed on pressors on 01/01/2024 with improvement with blood pressure, pressors discontinued the morning of 01/02/2024. - BP has improved and as such patient was started on low-dose Lasix  20 mg IV every 12 hours. - Strict I's and O's. - Due to complicated cardiac history, soft/low blood pressure with difficulty diuresing in the setting of acute CHF exacerbation cardiology was consulted and are following.   - Per cardiology.  5.  Chronic hyponatremia - Patient noted to have previously been on salt tablets which was discontinued as patient was not taking diuretics of Lasix  on a daily basis. - Current course of hyponatremia likely secondary to hypervolemic hyponatremia. - Due to soft/low blood pressure unable to place on IV diuretics early on in the hospitalization.   - Hyponatremia improved since admission  after receiving a dose of IV Lasix . - Continue to hold salt tablets. -Hyponatremia improving with diuresis. -Continue Lasix  20 mg IV every 12 hours as blood pressure tolerates. - Follow.  6.  Debris in esophagus/dysphagia -Noted on CT soft tissue neck and CT chest. - Patient presented with severe sepsis with concerns for bilateral pneumonia/aspiration pneumonia. - Concern as to whether patient may actually be able to tolerate oral pills and whether they are being absorbed due to debris noted in the esophagus. - GI consulted Home at this time does not feel patient has esophageal food impaction but likely may have a esophageal dysmotility issue. - SLP following. - Eliquis  discontinued patient placed on Lovenox  for now pending possible procedure in house. - GI recommending trial of diet recommended by SLP and if patient tolerates diet over the weekend  recommend an outpatient endoscopy once patient has recovered from pneumonia. -Patient tolerating current dysphagia 1 diet and per GI advance as recommended by SLP. - Per GI if patient vomits or unable to tolerate diet then will need to consider inpatient endoscopy at this time. - GI following and appreciate their input and recommendations.  7.  Chronic A-fib -Currently rate controlled on IV Lopressor . - Continue amiodarone . - Eliquis  held and patient placed on full dose Lovenox  for now due to problem #6 and concern for possible in-house procedure. -Could likely resume Eliquis  in the next 24 hours-no further procedures planned. - Cardiology consulted and following.  8.  Hyperthyroidism -Continue home regimen  Tapazole . - Continue IV Lopressor . - Outpatient follow-up with primary endocrinologist.  9.  AKI on CKD stage IIIa - Baseline creatinine approximately 1.2. - Likely secondary to acute CHF exacerbation in the setting of low blood pressure. - Patient received a dose of IV Lasix  with some improvement in renal function. -Patient with  urine output of 2.225 L over the past 24 hours. -Renal function slowly improving. - Continue Lasix  20 mg IV every 12 hours and monitor renal function. - Avoid nephrotoxins.  10.  Bilateral pleural effusion -Likely secondary to volume overload. - Unable to diurese due to soft/low blood pressure early on in the hospitalization.   -BP improving. - Repeat chest x-ray done the morning of 01/02/2024, with enlarged heart with vascular congestion and edema.  Right greater than left small pleural effusions.  - Patient assessed by PCCM who currently recommending holding off on thoracentesis as bilateral pleural effusions and ascites likely due to decompensated CHF. -Patient with urine output of 2.225 L over the past 24 hours - Continue Lasix  20 mg IV every 12 hours and monitor blood pressure closely.  11.  Hypotension/soft blood pressure -Patient with soft blood pressure/hypotension early on in the. - Likely secondary to problem #1. - Difficulty with diuresing early on in the hospitalization, due to soft blood pressure/hypotension. - Status post IV albumin  x 1.   - Cortisol level at 11.4.  - Lactic acid level was elevated at 2.8 and fluctuating.   - Procalcitonin at 0.26 and 0.24 and now 0.32.  - Patient was placed on Levophed  gtt on 01/01/2024, which has subsequently been weaned off the morning of 01/02/2024.  -BP improved. - Continue empiric IV antibiotics.  -Monitor BP with IV diuretics -See problem #1.  12.  Hypokalemia/hypophosphatemia -Potassium at 3.7, phosphorus at 2.8. - K-Phos 250 mg p.o. twice daily x 2 days. - KCl 40 mEq p.o. x 1. - Repeat labs in the AM.  13. Lethargy/Drowiness - Patient noted with an episode of lethargy and drowsiness the afternoon of 01/03/2024. - CT head done negative. - ABG, CBC, c-Met, ammonia levels with no significant abnormalities noted. - Patient improved clinically and back to baseline. - No further workup needed.      DVT prophylaxis:  Lovenox  Code Status: Full Family Communication: Updated patient.  Updated daughter-in-law/primary caretaker Shacoya Burkhammer) on telephone. Disposition: Home with home health when clinically stable and cleared by ID and cardiology.  Status is: Inpatient Remains inpatient appropriate because: Severity of illness   Consultants:  Cardiology: Dr. Michele 01/01/2024 Gastroenterology: Dr. Burnette 01/01/2024 PCCM: Dr.Joshi 01/01/2024 ID: Dr. Dennise 01/01/2024  Procedures:  CT soft tissue neck 12/31/2023 CT maxillofacial 12/31/2023 Chest x-ray 12/31/2023, 01/02/2024 2D echo 01/01/2024 CT chest abdomen and pelvis 12/31/2023 Right upper quadrant ultrasound 01/02/2024 CT head 01/03/2024  Antimicrobials:  Anti-infectives (From admission, onward)    Start     Dose/Rate Route Frequency Ordered Stop   12/31/23 0800  piperacillin -tazobactam (ZOSYN ) IVPB 3.375 g        3.375 g 12.5 mL/hr over 240 Minutes Intravenous Every 8 hours 12/31/23 0738     12/31/23 0145  piperacillin -tazobactam (ZOSYN ) IVPB 3.375 g        3.375 g 100 mL/hr over 30 Minutes Intravenous  Once 12/31/23 0143 12/31/23 0245         Subjective: Patient sitting up in recliner.  Denies any chest pain or shortness of breath.  Denies any abdominal pain.  Tolerating current diet.  Currently on 3 L nasal cannula.  Objective: Vitals:   01/04/24 0700 01/04/24 0800 01/04/24 0803 01/04/24 0900  BP: 116/71 (!) 136/92  116/88  Pulse: 81 (!) 103  91  Resp: (!) 22 18  (!) 24  Temp: 98.1 F (36.7 C) 98.1 F (36.7 C) (!) 96.9 F (36.1 C) 98.2 F (36.8 C)  TempSrc:   Axillary   SpO2: 99% 98%  100%  Weight:      Height:        Intake/Output Summary (Last 24 hours) at 01/04/2024 1006 Last data filed at 01/04/2024 0806 Gross per 24 hour  Intake 325.65 ml  Output 2450 ml  Net -2124.35 ml   Filed Weights   12/31/23 0426 01/03/24 0955 01/04/24 0500  Weight: 49.4 kg 51.1 kg 50.5 kg    Examination:  General exam: NAD. Respiratory  system: Bibasilar crackles.  Decreased coarse breath sounds.  No wheezing.  Fair air movement.  On 3 L nasal cannula with sats of 99 to 100%.  Cardiovascular system: Irregularly irregular.  No JVD, no murmurs rubs or gallops.  No pitting lower extremity edema.  Gastrointestinal system: Abdomen is soft, nontender, nondistended, positive bowel sounds.  No rebound.  No guarding. Central nervous system: Alert and oriented. No focal neurological deficits. Extremities: Symmetric 5 x 5 power. Skin: No rashes, lesions or ulcers Psychiatry: Judgement and insight appear normal. Mood & affect appropriate.     Data Reviewed: I have personally reviewed following labs and imaging studies  CBC: Recent Labs  Lab 01/01/24 0828 01/02/24 0308 01/03/24 0218 01/03/24 1553 01/04/24 0438  WBC 11.6* 7.3 6.3 7.3 6.1  NEUTROABS 10.5* 6.1 5.5 5.8 4.3  HGB 12.5 10.6* 11.5* 12.6 11.6*  HCT 40.4 34.2* 37.1 39.8 38.5  MCV 91.2 93.4 94.2 90.7 93.2  PLT 179 166 154 170 152    Basic Metabolic Panel: Recent Labs  Lab 01/01/24 0828 01/02/24 0308 01/03/24 0218 01/03/24 1553 01/04/24 0438  NA 133* 131* 132* 131* 136  K 4.6 3.5 3.4* 3.6 3.7  CL 96* 93* 96* 95* 99  CO2 25 26 24 28 27   GLUCOSE 72 157* 145* 113* 88  BUN 29* 27* 23 23 25*  CREATININE 1.45* 1.27* 1.06* 0.93 1.02*  CALCIUM 9.0 8.8* 9.1 8.5* 8.6*  MG 1.9 1.7 1.7  --  2.0  PHOS  --  2.6 2.4*  --  2.8    GFR: Estimated Creatinine Clearance: 38.3 mL/min (A) (by C-G formula based on SCr of 1.02 mg/dL (H)).  Liver Function Tests: Recent Labs  Lab 01/01/24 0828 01/02/24 0308 01/03/24 0218 01/03/24 1553 01/04/24 0438  AST 43* 33 36 29  --   ALT 36 26 27 23   --   ALKPHOS 174* 152* 161* 141*  --   BILITOT 2.1* 1.7* 2.0* 1.6*  --   PROT 6.3* 6.1* 6.2* 5.9*  --   ALBUMIN  2.4* 3.0* 2.7* 2.3* 2.2*    CBG: Recent Labs  Lab 01/03/24 1530  GLUCAP 133*     Recent Results (from the past 240 hours)  Blood culture (routine x 2)     Status:  Abnormal   Collection Time: 12/31/23  2:00 AM   Specimen: BLOOD  Result Value Ref Range Status   Specimen Description   Final    BLOOD LEFT ANTECUBITAL Performed at [facility], [address], [place], [place] [address]    Special Requests   Final    BOTTLES DRAWN AEROBIC AND ANAEROBIC Blood Culture results may not be optimal due to  an inadequate volume of blood received in culture bottles Performed at [facility], [address], [place], [place] [address]    Culture  Setup Time   Final    GRAM POSITIVE COCCI IN PAIRS IN CHAINS IN BOTH AEROBIC AND ANAEROBIC BOTTLES CRITICAL RESULT CALLED TO, READ BACK BY AND VERIFIED WITH: PHARMD EMERSON MILLET [id number] @ 2212 FH    Culture (A)  Final    ENTEROCOCCUS [name] STAPHYLOCOCCUS EPIDERMIDIS THE SIGNIFICANCE OF ISOLATING THIS ORGANISM FROM A SINGLE SET OF BLOOD CULTURES WHEN MULTIPLE SETS ARE DRAWN IS UNCERTAIN. PLEASE NOTIFY THE MICROBIOLOGY DEPARTMENT WITHIN ONE WEEK IF SPECIATION AND SENSITIVITIES ARE REQUIRED. Performed at [facility], [address] [place], [place] [address]    Report Status 01/02/2024 FINAL  Final   Organism ID, Bacteria ENTEROCOCCUS [name]  Final      Susceptibility   Enterococcus hirae - MIC*    AMPICILLIN <=2 SENSITIVE Sensitive     VANCOMYCIN  <=0.5 SENSITIVE Sensitive     GENTAMICIN SYNERGY SENSITIVE Sensitive     * ENTEROCOCCUS [name]  Blood Culture ID Panel (Reflexed)     Status: None   Collection Time: 12/31/23  2:00 AM  Result Value Ref Range Status   Enterococcus faecalis NOT DETECTED NOT DETECTED Final   Enterococcus Faecium NOT DETECTED NOT DETECTED Final   Listeria monocytogenes NOT DETECTED NOT DETECTED Final   Staphylococcus species NOT DETECTED NOT DETECTED Final   Staphylococcus aureus (BCID) NOT DETECTED NOT DETECTED Final   Staphylococcus epidermidis NOT DETECTED NOT DETECTED Final   Staphylococcus lugdunensis NOT DETECTED NOT DETECTED Final    Streptococcus species NOT DETECTED NOT DETECTED Final   Streptococcus agalactiae NOT DETECTED NOT DETECTED Final   Streptococcus pneumoniae NOT DETECTED NOT DETECTED Final   Streptococcus pyogenes NOT DETECTED NOT DETECTED Final   A.calcoaceticus-baumannii NOT DETECTED NOT DETECTED Final   Bacteroides fragilis NOT DETECTED NOT DETECTED Final   Enterobacterales NOT DETECTED NOT DETECTED Final   Enterobacter cloacae complex NOT DETECTED NOT DETECTED Final   Escherichia coli NOT DETECTED NOT DETECTED Final   Klebsiella aerogenes NOT DETECTED NOT DETECTED Final   Klebsiella oxytoca NOT DETECTED NOT DETECTED Final   Klebsiella pneumoniae NOT DETECTED NOT DETECTED Final   Proteus species NOT DETECTED NOT DETECTED Final   Salmonella species NOT DETECTED NOT DETECTED Final   Serratia marcescens NOT DETECTED NOT DETECTED Final   Haemophilus influenzae NOT DETECTED NOT DETECTED Final   Neisseria meningitidis NOT DETECTED NOT DETECTED Final   Pseudomonas aeruginosa NOT DETECTED NOT DETECTED Final   Stenotrophomonas maltophilia NOT DETECTED NOT DETECTED Final   Candida albicans NOT DETECTED NOT DETECTED Final   Candida auris NOT DETECTED NOT DETECTED Final   Candida glabrata NOT DETECTED NOT DETECTED Final   Candida krusei NOT DETECTED NOT DETECTED Final   Candida parapsilosis NOT DETECTED NOT DETECTED Final   Candida tropicalis NOT DETECTED NOT DETECTED Final   Cryptococcus neoformans/gattii NOT DETECTED NOT DETECTED Final    Comment: Performed at [facility], [address], [place], [place] [address]  Blood culture (routine x 2)     Status: None (Preliminary result)   Collection Time: 12/31/23  2:15 AM   Specimen: BLOOD  Result Value Ref Range Status   Specimen Description   Final    BLOOD RIGHT ANTECUBITAL Performed at [facility], [address], [place], [place] [address]    Special Requests   Final    BOTTLES DRAWN AEROBIC AND  ANAEROBIC Blood Culture adequate  volume Performed at [facility], [address], [place], [place] [address]    Culture   Final    NO GROWTH 4 DAYS Performed at [facility], [address] [place], [place] [address]    Report Status PENDING  Incomplete  MRSA Next Gen by PCR, Nasal     Status: None   Collection Time: 12/31/23  9:14 AM   Specimen: Nasal Mucosa; Nasal Swab  Result Value Ref Range Status   MRSA by PCR Next Gen NOT DETECTED NOT DETECTED Final    Comment: (NOTE) The GeneXpert MRSA Assay (FDA approved for NASAL specimens only), is one component of a comprehensive MRSA colonization surveillance program. It is not intended to diagnose MRSA infection nor to guide or monitor treatment for MRSA infections. Test performance is not FDA approved in patients less than 62 years old. Performed at [facility], [address], [place], [place] [address]   Urine Culture (for pregnant, neutropenic or urologic patients or patients with an indwelling urinary catheter)     Status: None   Collection Time: 01/01/24 10:37 AM   Specimen: Urine, Catheterized  Result Value Ref Range Status   Specimen Description   Final    URINE, CATHETERIZED Performed at [facility], [address], [place], [place] [address]    Special Requests   Final    NONE Performed at [facility], [address] [place], [place] [address]    Culture   Final    NO GROWTH Performed at [facility], [address] [place], [place] [address]    Report Status 01/02/2024 FINAL  Final  Culture, blood (Routine X 2) w Reflex to ID Panel     Status: None (Preliminary result)   Collection Time: 01/02/24  7:26 AM   Specimen: BLOOD LEFT HAND  Result Value Ref Range Status   Specimen Description   Final    BLOOD LEFT HAND Performed at [facility], [address] [place], [place] [address]    Special Requests   Final    BOTTLES DRAWN AEROBIC ONLY Blood  Culture results may not be optimal due to an inadequate volume of blood received in culture bottles Performed at [facility], [address] [place], [place] [address]    Culture   Final    NO GROWTH 2 DAYS Performed at [facility], [address], [place], [place] [address]    Report Status PENDING  Incomplete  Culture, blood (Routine X 2) w Reflex to ID Panel     Status: None (Preliminary result)   Collection Time: 01/02/24  7:32 AM   Specimen: BLOOD RIGHT ARM  Result Value Ref Range Status   Specimen Description   Final    BLOOD RIGHT ARM Performed at [facility], [address] [place], [place] [address]    Special Requests   Final    BOTTLES DRAWN AEROBIC ONLY Blood Culture results may not be optimal due to an inadequate volume of blood received in culture bottles Performed at [facility], [address], [place], [place] [address]    Culture   Final    NO GROWTH 2 DAYS Performed at [facility], [address], [place], [place] [address]    Report Status PENDING  Incomplete         Radiology Studies: CT HEAD WO CONTRAST (  ) Result Date: 01/03/2024 CLINICAL DATA:  Altered mental status EXAM: CT HEAD WITHOUT CONTRAST TECHNIQUE: Contiguous axial images were obtained from the base of the skull through the vertex without intravenous contrast. RADIATION DOSE REDUCTION: This exam was performed according to the departmental dose-optimization program which includes automated exposure control, adjustment of the mA and/or kV according to patient size and/or use of iterative reconstruction technique. COMPARISON:  None Available. FINDINGS: Brain: There is no mass, hemorrhage or extra-axial collection. The size and configuration of the ventricles and extra-axial CSF spaces are normal. There is hypoattenuation of the white matter, most commonly indicating chronic small vessel disease. Vascular: No hyperdense vessel or unexpected  vascular calcification. Skull: The visualized skull base, calvarium and extracranial soft tissues are normal. Sinuses/Orbits: No fluid levels or advanced mucosal thickening of the visualized paranasal sinuses. No mastoid or middle ear effusion. Normal orbits. Other: None. IMPRESSION: 1. No acute intracranial abnormality. 2. Chronic small vessel disease. Electronically Signed   By: Franky Stanford M.D.   On: 01/03/2024 20:08        Scheduled Meds:  amiodarone   100 mg Oral Daily   Chlorhexidine  Gluconate Cloth  6 each Topical Daily   enoxaparin  (LOVENOX ) injection  50 mg Subcutaneous Q12H   feeding supplement  237 mL Oral BID BM   furosemide   20 mg Intravenous BID   leptospermum manuka honey  1 Application Topical Daily   methimazole   2.5 mg Oral Daily   metoprolol  tartrate  5 mg Intravenous Q6H   phosphorus  250 mg Oral BID   Continuous Infusions:  piperacillin -tazobactam (ZOSYN )  IV 3.375 g (01/04/24 0841)     LOS: 4 days    Time spent: 45 minutes    Toribio Hummer, MD [facility]   To contact the attending provider between 7A-7P or the covering provider during after hours 7P-7A, please log into the web site [email or web address] and access using universal [place] password for that web site. If you do not have the password, please call the hospital operator.  01/04/2024, 10:06 AM

## 2024-01-04 NOTE — TOC Progression Note (Signed)
 Transition of Care [facility]) - Progression Note    Patient Details  Name: Rhonda Marshall MRN: [id number] Date of Birth: 10-26-1950  Transition of Care [facility]) CM/SW Contact  Jon ONEIDA Anon, RN Phone Number: 01/04/2024, 10:52 AM  Clinical Narrative:    NCM spoke with pt daughter Avelina at [phone] to discuss [facility] once pt is discharged from the hospital. Pt daughter denies any need for [facility] at [place]. States there are plenty of loved ones willing to offer helping hands. If at a later date that may change she will reach out to [facility] to assist her. MD made aware of daughter wishes. TOC continuing to follow.   Expected Discharge Plan: Home/Self Care Barriers to Discharge: Continued Medical Work up  Expected Discharge Plan and Services In-house Referral: NA Discharge Planning Services: CM Consult Post Acute Care Choice: NA Living arrangements for the past 2 months: Single Family Home                 DME Arranged: N/A DME Agency: NA       HH Arranged: NA HH Agency: NA         Social Determinants of Health (SDOH) Interventions SDOH Screenings   Food Insecurity: No Food Insecurity (12/31/2023)  Housing: Low Risk  (12/31/2023)  Transportation Needs: No Transportation Needs (12/31/2023)  Utilities: Not At Risk (12/31/2023)  Alcohol Screen: Low Risk  (08/18/2023)  Financial Resource Strain: Low Risk  (08/18/2023)  Social Connections: Moderately Integrated (12/31/2023)  Tobacco Use: Medium Risk (12/31/2023)    Readmission Risk Interventions    01/01/2024    1:00 PM 04/02/2023    3:52 PM 03/30/2023    1:51 PM  Readmission Risk Prevention Plan  Transportation Screening Complete Complete Complete  PCP or Specialist Appt within 5-7 Days  Complete Complete  PCP or Specialist Appt within 3-5 Days Complete    Home Care Screening  Complete Complete  Medication Review (RN CM)  Complete Complete  HRI or Home Care Consult Complete    Social Work Consult for Recovery Care  Planning/Counseling Complete    Palliative Care Screening Not Applicable    Medication Review Oceanographer) Complete

## 2024-01-04 NOTE — Progress Notes (Signed)
 [facility]  Date of Admission:  12/31/2023   Total days of inpatient antibiotics 4  Principal Problem:   Severe sepsis with septic shock (CODE) (HCC) Active Problems:   Hyperthyroidism   Hypotension   AKI (acute kidney injury) (HCC)   Hyponatremia   Acute on chronic systolic CHF (congestive heart failure) (HCC)   Acute respiratory failure with hypoxia (HCC)   CKD (chronic kidney disease) stage 3, GFR 30-59 ml/min (HCC)   Sepsis due to pneumonia (HCC)   Dysphagia   Aspiration pneumonia of both lungs (HCC)   Bilateral pleural effusion   Lactic acidosis   Permanent atrial fibrillation (HCC)   Bacteremia   Lethargy          Assessment: 73 year old female with A-fib on Eliquis , hypertension, rheumatoid arthritis, recent ED visitfollowing a fall discharged on 6/16 presented again with worsening shortness of breath found to have #Enterococcus hirae bacteremia  with PNA secondary to unclear source, possible gi translocation vs pulmonary #AMS -Blood cul CT chest showed diffuse bilateral bronchopneumonia, bilateral lower lobe consolidation.  BCx from admission 2/4 bottles Enterococcus - Reviewed UA from 6/20 negative nitrites negative leukocytes. Ucx NG Interval: less responsive this afternoon,primary contacted. CT head negative. Today 6/23 mentation is improved. She is able to answer questions.  Recommendations:  -Continue amp + ctx tomorrow. 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 Pt states she had SHOB all of a sudden, prior to this she had diarrhea. She may have had bacterial translocation leading to bacteremia=-> endocarditis with emboli to lungs vs initial PNA leading to bacteremia. Enterococcus is commonly associated GI organism, although Endocarditis is generally subacute, not as acute as in the case.  - Follow Repeat blood cultures  - TTE no veg, moderate AV valve thickening.  TEE contraindicated given  throat/neck radiation per cards.  -Colonoscopy outpatient as source  of bacteremia seems unclear -Discussed with Primary       Microbiology:   Antibiotics: Piptazo 6/18-   Cultures: Blood 6/18 2/4 bottles Enterococcus hirae  SUBJECTIVE: Sitting in chair. Abloe to answer questions Interval: temp 99.6 t max  Review of Systems: Review of Systems  All other systems reviewed and are negative.    Scheduled Meds:  amiodarone   100 mg Oral Daily   Chlorhexidine  Gluconate Cloth  6 each Topical Daily   enoxaparin  (LOVENOX ) injection  50 mg Subcutaneous Q12H   feeding supplement  237 mL Oral BID BM   furosemide   20 mg Intravenous BID   leptospermum manuka honey  1 Application Topical Daily   methimazole   2.5 mg Oral Daily   metoprolol  tartrate  5 mg Intravenous Q6H   phosphorus  250 mg Oral BID   Continuous Infusions:  ampicillin (OMNIPEN) IV Stopped (01/04/24 1233)   cefTRIAXone  (ROCEPHIN )  IV Stopped (01/04/24 1207)   PRN Meds:.acetaminophen  **OR** acetaminophen , albuterol , ALPRAZolam , diclofenac  Sodium, ondansetron  **OR** ondansetron  (ZOFRAN ) IV, traMADol, traZODone  Allergies  Allergen Reactions   Clindamycin/Lincomycin Rash   Latex Rash   Plaquenil  [Hydroxychloroquine ] Other (See Comments)    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  Physical Exam Constitutional:      Comments: Less alert  HENT:     Head: Normocephalic and atraumatic.     Right Ear: Tympanic membrane normal.     Left Ear: Tympanic membrane normal.     Nose: Nose normal.     Mouth/Throat:     Mouth: Mucous membranes are moist.   Eyes:     Extraocular Movements:  Extraocular movements intact.     Conjunctiva/sclera: Conjunctivae normal.     Pupils: Pupils are equal, round, and reactive to light.    Cardiovascular:     Rate and Rhythm: Normal rate and regular rhythm.     Heart sounds: No murmur heard.    No friction rub. No gallop.  Pulmonary:     Effort: Pulmonary effort is normal.     Breath sounds: Normal breath sounds.  Abdominal:     General: Abdomen is flat.     Palpations: Abdomen is soft.   Skin:    General: Skin is warm and dry.   Psychiatric:        Mood and Affect: Mood normal.       Lab Results Lab Results  Component Value Date   WBC 6.1 01/04/2024   HGB 11.6 (L) 01/04/2024   HCT 38.5 01/04/2024   MCV 93.2 01/04/2024   PLT 152 01/04/2024    Lab Results  Component Value Date   CREATININE 1.02 (H) 01/04/2024   BUN 25 (H) 01/04/2024   NA 136 01/04/2024   K 3.7 01/04/2024   CL 99 01/04/2024   CO2 27 01/04/2024    Lab Results  Component Value Date   ALT 23 01/03/2024   AST 29 01/03/2024   ALKPHOS 141 (H) 01/03/2024   BILITOT 1.6 (H) 01/03/2024        Loney Stank, MD [facility] 01/04/2024, 1:03 PM Evaluation of this patient requires complex antimicrobial therapy evaluation and counseling + isolation needs for disease transmission risk assessment and mitigation

## 2024-01-04 NOTE — Progress Notes (Signed)
   01/04/24 1105  Spiritual Encounters  Type of Visit Initial  Care provided to: Patient  Conversation partners present during encounter Physician  Referral source Chaplain assessment   While rounding on unit, I visited with Mrs. Rhonda Marshall.  Rhonda Marshall welcomed my visit. She debriefed with me about her health concerns. She also shared losses of two adult children, one son's death seemed to be especially impactful. Her cardiologist, Dr. JAYSON, also rounded during visit.  I provided compassionate presence and active listening.I held space for processing feelings of loss and frustrations of life change. I celebrated her improved health and affirmed her resilience, offering words of encouragement.   Davidlee Jeanbaptiste L. Fredrica, M.Div [phone]

## 2024-01-04 NOTE — Evaluation (Signed)
 Physical Therapy Evaluation Patient Details Name: Rhonda Marshall MRN: [id number] DOB: 1951-02-19 Today's Date: 01/04/2024  History of Present Illness  Patient is a 73 year old female who presented on 12/31/23 with volume overload due to acute on chronic CHF, severe sepsis due to multifocal pneumonia. patient was also found to have occult radial head fracture on L side. patient declined to have another xray cvompleted on R side but patient. reporting R side is the one fractured.PMH: prediabetes, permanent a fib, hypokalemia., HTN,RA, hypothyroidism  Clinical Impression  Pt admitted with above diagnosis.  Pt currently with functional limitations due to the deficits listed below (see PT Problem List). Pt will benefit from acute skilled PT to increase their independence and safety with mobility to allow discharge.       The patient is well known to PT from previous admissions.  Patient reports independent and ambulatory  in home with no device, does have a rollator as needed.  Patient reports that  her sons are supportive and plans to return home,. Recommend HHPT.  Patient maintained on  3 L to ambulate x 40' using a RW and min/minguard support. Spo2 remained > 94%, HR 89, BP 116/88     If plan is discharge home, recommend the following: A little help with walking and/or transfers;Assistance with cooking/housework;A little help with bathing/dressing/bathroom;Assist for transportation;Help with stairs or ramp for entrance   Can travel by private vehicle        Equipment Recommendations None recommended by PT  Recommendations for Other Services       Functional Status Assessment Patient has had a recent decline in their functional status and demonstrates the ability to make significant improvements in function in a reasonable and predictable amount of time.     Precautions / Restrictions Precautions Precautions: Fall Precaution/Restrictions Comments: L side occult radial head  fracutre. Restrictions Weight Bearing Restrictions Per Provider Order: No      Mobility  Bed Mobility Overal bed mobility: Needs Assistance Bed Mobility: Supine to Sit     Supine to sit: Contact guard     General bed mobility comments: extra time, able to slowly sit up, encouraged no weight through right UE to scoot, able to shift and scoot    Transfers Overall transfer level: Needs assistance Equipment used: Rolling walker (2 wheels) Transfers: Sit to/from Stand Sit to Stand: Min assist           General transfer comment: no extra support    Ambulation/Gait Ambulation/Gait assistance: Min assist, +2 safety/equipment Gait Distance (Feet): 40 Feet Assistive device: Rolling walker (2 wheels) Gait Pattern/deviations: Step-through pattern Gait velocity: decr     General Gait Details: gait steady with RW  Stairs            Wheelchair Mobility     Tilt Bed    Modified Rankin (Stroke Patients Only)       Balance Overall balance assessment: Mild deficits observed, not formally tested                                           Pertinent Vitals/Pain Pain Assessment Pain Assessment: No/denies pain    Home Living Family/patient expects to be discharged to:: Private residence Living Arrangements: Children Available Help at Discharge: Family Type of Home: House Home Access: Stairs to enter Entrance Stairs-Rails: Can reach both Entrance Stairs-Number of Steps: 3 Alternate Level Stairs-Number of  Steps: her bedroom is on the main level Home Layout: Two level;Able to live on main level with bedroom/bathroom Home Equipment: Shower Counsellor (2 wheels);BSC/3in1, rollator Additional Comments: sons assist    Prior Function Prior Level of Function : Independent/Modified Independent             Mobility Comments: ind no AD., uses rollator  PRN ADLs Comments: ind     Extremity/Trunk Assessment   Upper Extremity  Assessment Upper Extremity Assessment: Right hand dominant;LUE deficits/detail;RUE deficits/detail RUE Deficits / Details: noted to have dressing on posterior elbow. reports that this one is the one that hurts. no imaging in chart noted to hhave declined x-ray on this side in notes. LUE Deficits / Details: has a radial head fracture but reports that it was the R side. no ROM deficits observed. h/o RA    Lower Extremity Assessment Lower Extremity Assessment: Defer to PT evaluation    Cervical / Trunk Assessment Cervical / Trunk Assessment: Kyphotic  Communication   Communication Communication: Impaired Factors Affecting Communication: Hearing impaired    Cognition Arousal: Alert Behavior During Therapy: WFL for tasks assessed/performed   PT - Cognitive impairments: No apparent impairments                         Following commands: Intact       Cueing       General Comments      Exercises     Assessment/Plan    PT Assessment Patient needs continued PT services  PT Problem List Decreased strength;Decreased mobility;Decreased range of motion;Decreased activity tolerance;Cardiopulmonary status limiting activity;Decreased balance;Decreased knowledge of use of DME       PT Treatment Interventions DME instruction;Therapeutic activities;Gait training;Therapeutic exercise;Patient/family education;Functional mobility training    PT Goals (Current goals can be found in the Care Plan section)  Acute Rehab PT Goals Patient Stated Goal: go home PT Goal Formulation: With patient/family Time For Goal Achievement: 01/18/24 Potential to Achieve Goals: Good    Frequency Min 2X/week     Co-evaluation               AM-PAC PT 6 Clicks Mobility  Outcome Measure Help needed turning from your back to your side while in a flat bed without using bedrails?: A Little Help needed moving from lying on your back to sitting on the side of a flat bed without using  bedrails?: A Little Help needed moving to and from a bed to a chair (including a wheelchair)?: A Little Help needed standing up from a chair using your arms (e.g., wheelchair or bedside chair)?: A Little Help needed to walk in hospital room?: A Lot Help needed climbing 3-5 steps with a railing? : A Lot 6 Click Score: 16    End of Session Equipment Utilized During Treatment: Gait belt;Oxygen Activity Tolerance: Patient tolerated treatment well Patient left: in chair;with call bell/phone within reach;with chair alarm set Nurse Communication: Mobility status PT Visit Diagnosis: Unsteadiness on feet (R26.81);Muscle weakness (generalized) (M62.81);Difficulty in walking, not elsewhere classified (R26.2)    Time: 9096-9074 PT Time Calculation (min) (ACUTE ONLY): 22 min   Charges:   PT Evaluation $PT Eval Low Complexity: 1 Low   PT General Charges $$ ACUTE PT VISIT: 1 Visit         Darice Potters PT Acute Rehabilitation Services Office [phone]   Potters Darice Norris 01/04/2024, 1:09 PM

## 2024-01-04 NOTE — Progress Notes (Addendum)
 [facility]  Date of Admission:  12/31/2023   Total days of inpatient antibiotics 3  Principal Problem:   Severe sepsis with septic shock (CODE) (HCC) Active Problems:   Hyperthyroidism   Hypotension   AKI (acute kidney injury) (HCC)   Hyponatremia   Acute on chronic systolic CHF (congestive heart failure) (HCC)   Acute respiratory failure with hypoxia (HCC)   CKD (chronic kidney disease) stage 3, GFR 30-59 ml/min (HCC)   Sepsis due to pneumonia (HCC)   Dysphagia   Aspiration pneumonia of both lungs (HCC)   Bilateral pleural effusion   Lactic acidosis   Permanent atrial fibrillation (HCC)   Bacteremia   Lethargy          Assessment: 73 year old female with A-fib on Eliquis , hypertension, rheumatoid arthritis, recent ED visitfollowing a fall discharged on 6/16 presented again with worsening shortness of breath found to have #Enterococcus hirae bacteremia secondary to pulmonary source vs wounds #AMS -Blood cul CT chest showed diffuse bilateral bronchopneumonia, bilateral lower lobe consolidation.  BCx from admission 2/4 bottles Enterococcus - Reviewed UA from 6/20 negative nitrites negative leukocytes. Ucx NG Interval: less responsive this afternoon,primary contacted. CT head negative.  Recommendations:  -Continue pip-tazo today-> amp + ctx tomorrow. Suspect will have to treat x 6 weeks for empiric endocarditis - Repeat blood cultures remain clear - TTE no veg, moderate AV valve thickening.  TEE contraindicated given throat/neck radiation per cards.  -Colonoscopy outpatient as source  of bacteremia seems unclear       Microbiology:   Antibiotics: Piptazo 6/18-   Cultures: Blood 6/18 2/4 bottles Enterococcus hirae  SUBJECTIVE: Less responsive this evening.  Interval: temp 100 overngiht  Review of Systems: Review of Systems  All other systems reviewed and are negative.    Scheduled Meds:  amiodarone   100 mg Oral Daily    Chlorhexidine  Gluconate Cloth  6 each Topical Daily   enoxaparin  (LOVENOX ) injection  50 mg Subcutaneous Q12H   feeding supplement  237 mL Oral BID BM   furosemide   20 mg Intravenous BID   leptospermum manuka honey  1 Application Topical Daily   methimazole   2.5 mg Oral Daily   metoprolol  tartrate  5 mg Intravenous Q6H   phosphorus  250 mg Oral BID   Continuous Infusions:  sodium chloride      piperacillin -tazobactam (ZOSYN )  IV Stopped (01/04/24 0431)   PRN Meds:.acetaminophen  **OR** acetaminophen , albuterol , ALPRAZolam , diclofenac  Sodium, ondansetron  **OR** ondansetron  (ZOFRAN ) IV, traZODone  Allergies  Allergen Reactions   Clindamycin/Lincomycin Rash   Latex Rash   Plaquenil  [Hydroxychloroquine ] Other (See Comments)    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  Physical Exam Constitutional:      Comments: Less alert  HENT:     Head: Normocephalic and atraumatic.     Right Ear: Tympanic membrane normal.     Left Ear: Tympanic membrane normal.     Nose: Nose normal.     Mouth/Throat:  Mouth: Mucous membranes are moist.   Eyes:     Extraocular Movements: Extraocular movements intact.     Conjunctiva/sclera: Conjunctivae normal.     Pupils: Pupils are equal, round, and reactive to light.    Cardiovascular:     Rate and Rhythm: Normal rate and regular rhythm.     Heart sounds: No murmur heard.    No friction rub. No gallop.  Pulmonary:     Effort: Pulmonary effort is normal.     Breath sounds: Normal breath sounds.  Abdominal:     General: Abdomen is flat.     Palpations: Abdomen is soft.   Skin:    General: Skin is warm  and dry.   Psychiatric:        Mood and Affect: Mood normal.       Lab Results Lab Results  Component Value Date   WBC 6.1 01/04/2024   HGB 11.6 (L) 01/04/2024   HCT 38.5 01/04/2024   MCV 93.2 01/04/2024   PLT 152 01/04/2024    Lab Results  Component Value Date   CREATININE 1.02 (H) 01/04/2024   BUN 25 (H) 01/04/2024   NA 136 01/04/2024   K 3.7 01/04/2024   CL 99 01/04/2024   CO2 27 01/04/2024    Lab Results  Component Value Date   ALT 23 01/03/2024   AST 29 01/03/2024   ALKPHOS 141 (H) 01/03/2024   BILITOT 1.6 (H) 01/03/2024        Loney Stank, MD [facility] 01/04/2024, 6:59 AM

## 2024-01-04 NOTE — Progress Notes (Signed)
 Speech Language Pathology Treatment: Dysphagia  Patient Details Name: Rhonda Marshall MRN: [id number] DOB: 1950/11/02 Today's Date: 01/04/2024 Time: 8889-8874 SLP Time Calculation (min) (ACUTE ONLY): 15 min  Assessment / Plan / Recommendation Clinical Impression  Patient seen by SLP for skilled treatment focused on dysphagia goals. Patient was awake, alert sitting in recliner and talking with Chaplain. Patient told SLP that she has been able to keep her food down and has not had an incident of nausea or vomiting since episode which she indicated occurred when she started taking antibiotics. RN indicated that patient ate most of her breakfast this morning and no overt s/s aspiration or swallowing difficulties observed. SLP assessed patient's PO toleration of thin liquids (via straw sips) and approximately puree solids (ice cream). She fed herself without any difficulty and no overt s/s aspiration observed. Although patient is not currently exhibiting overt s/s aspiration and not c/o difficulty with PO intake or toleration, SLP recommending to proceed with MBS to include sweep/screen of esophagus secondary to her h/o esophageal cancer and imaging showing debris within the esophagus. Continue current PO diet at this time.   HPI HPI: Pt is a 73 yo female with recent fall on 6/16, now presenting 6/19 with dysphagia and dyspnea. Admitted with volume overload due to chronic heart failure as well as severe sepsis due to multifocal PNA. Imaging also revealed debris within the esophagus. Pt has been evaluated by SLP in the past (clinical evaluations in 2021, 2023, 2024) with some concern for ability to masticate but without overt concern for pharyngeal dysphagia or aspiration. 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), afib, HTN, RA, hyperthyroidism, HF      SLP Plan  Continue with current plan of care;MBS          Recommendations  Diet recommendations: Dysphagia 1  (puree);Thin liquid Liquids provided via: Cup;Straw Medication Administration: Other (Comment) (as tolerated) Supervision: Patient able to self feed Compensations: Slow rate;Small sips/bites Postural Changes and/or Swallow Maneuvers: Seated upright 90 degrees;Upright 30-60 min after meal                  Oral care BID   Set up Supervision/Assistance Dysphagia, unspecified (R13.10)     Continue with current plan of care;MBS     Norleen IVAR Blase, MA, CCC-SLP Speech Therapy

## 2024-01-05 ENCOUNTER — Inpatient Hospital Stay (HOSPITAL_COMMUNITY)

## 2024-01-05 DIAGNOSIS — J690 Pneumonitis due to inhalation of food and vomit: Secondary | ICD-10-CM | POA: Diagnosis not present

## 2024-01-05 DIAGNOSIS — J9601 Acute respiratory failure with hypoxia: Secondary | ICD-10-CM | POA: Diagnosis not present

## 2024-01-05 DIAGNOSIS — E871 Hypo-osmolality and hyponatremia: Secondary | ICD-10-CM | POA: Diagnosis not present

## 2024-01-05 DIAGNOSIS — J189 Pneumonia, unspecified organism: Secondary | ICD-10-CM | POA: Diagnosis not present

## 2024-01-05 DIAGNOSIS — R6521 Severe sepsis with septic shock: Secondary | ICD-10-CM | POA: Diagnosis not present

## 2024-01-05 LAB — CBC WITH DIFFERENTIAL/PLATELET
Abs Immature Granulocytes: 0.03 10*3/uL (ref 0.00–0.07)
Basophils Absolute: 0.1 10*3/uL (ref 0.0–0.1)
Basophils Relative: 1 %
Eosinophils Absolute: 0.3 10*3/uL (ref 0.0–0.5)
Eosinophils Relative: 4 %
HCT: 37.1 % (ref 36.0–46.0)
Hemoglobin: 11.3 g/dL — ABNORMAL LOW (ref 12.0–15.0)
Immature Granulocytes: 1 %
Lymphocytes Relative: 19 %
Lymphs Abs: 1.1 10*3/uL (ref 0.7–4.0)
MCH: 28.8 pg (ref 26.0–34.0)
MCHC: 30.5 g/dL (ref 30.0–36.0)
MCV: 94.6 fL (ref 80.0–100.0)
Monocytes Absolute: 0.7 10*3/uL (ref 0.1–1.0)
Monocytes Relative: 11 %
Neutro Abs: 3.9 10*3/uL (ref 1.7–7.7)
Neutrophils Relative %: 64 %
Platelets: 163 10*3/uL (ref 150–400)
RBC: 3.92 MIL/uL (ref 3.87–5.11)
RDW: 18.6 % — ABNORMAL HIGH (ref 11.5–15.5)
WBC: 6.1 10*3/uL (ref 4.0–10.5)
nRBC: 0 % (ref 0.0–0.2)

## 2024-01-05 LAB — BASIC METABOLIC PANEL WITH GFR
Anion gap: 13 (ref 5–15)
BUN: 27 mg/dL — ABNORMAL HIGH (ref 8–23)
CO2: 28 mmol/L (ref 22–32)
Calcium: 8.8 mg/dL — ABNORMAL LOW (ref 8.9–10.3)
Chloride: 93 mmol/L — ABNORMAL LOW (ref 98–111)
Creatinine, Ser: 1.1 mg/dL — ABNORMAL HIGH (ref 0.44–1.00)
GFR, Estimated: 53 mL/min — ABNORMAL LOW (ref >60)
Glucose, Bld: 82 mg/dL (ref 70–99)
Potassium: 3.8 mmol/L (ref 3.5–5.1)
Sodium: 134 mmol/L — ABNORMAL LOW (ref 135–145)

## 2024-01-05 LAB — CULTURE, BLOOD (ROUTINE X 2)
Culture: NO GROWTH
Special Requests: ADEQUATE

## 2024-01-05 LAB — MAGNESIUM: Magnesium: 2.1 mg/dL (ref 1.7–2.4)

## 2024-01-05 MED ORDER — APIXABAN 5 MG PO TABS
5.0000 mg | ORAL_TABLET | Freq: Two times a day (BID) | ORAL | Status: DC
  Administered 2024-01-05 – 2024-01-08 (×6): 5 mg via ORAL
  Filled 2024-01-05 (×6): qty 1

## 2024-01-05 MED ORDER — METOPROLOL SUCCINATE ER 25 MG PO TB24
100.0000 mg | ORAL_TABLET | Freq: Every day | ORAL | Status: DC
  Administered 2024-01-05 – 2024-01-08 (×4): 100 mg via ORAL
  Filled 2024-01-05 (×4): qty 4

## 2024-01-05 MED ORDER — FUROSEMIDE 20 MG PO TABS: 20.0000 mg | ORAL_TABLET | Freq: Two times a day (BID) | ORAL | Status: DC

## 2024-01-05 MED ORDER — FUROSEMIDE 40 MG PO TABS
40.0000 mg | ORAL_TABLET | Freq: Every day | ORAL | Status: DC
  Administered 2024-01-05: 40 mg via ORAL
  Filled 2024-01-05: qty 1

## 2024-01-05 NOTE — Procedures (Signed)
 Modified Barium Swallow Study  Patient Details  Name: Rhonda Marshall MRN: [id number] Date of Birth: 1950-08-03  Today's Date: 01/05/2024  Modified Barium Swallow completed.  Full report located under Chart Review in the Imaging Section.  History of Present Illness Pt is a 73 yo female with recent fall on 6/16, now presenting 6/19 with dysphagia and dyspnea. Admitted with volume overload due to chronic heart failure as well as severe sepsis due to multifocal PNA. Imaging also revealed debris within the esophagus. Pt has been evaluated by SLP in the past (clinical evaluations in 2021, 2023, 2024) with some concern for ability to masticate but without overt concern for pharyngeal dysphagia or aspiration. 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  Oropharyngeal swallow is largely functional - aside from mild difficulties to masticate solids due edentulous status.   Pharyngeal motlity including epiglottic deflection and airway closure is adequate fortunately with only trace retention. Barium tablet given with thin easily transited through oropharynx.  MBS does not diagnose below PES however after puree/solid pt appeared with retention - which appeared effectively cleared with thin barium.  Recommend consider advancing diet as pt desires to maximize her intake using compensation strategies for dysphagia mitigation.  SlP will follow up briefly for compensation strategy review with pt for likely esophageal dysmotility per GI. Factors that may increase risk of adverse event in presence of aspiration Noe & Lianne 2021): Limited mobility;Frail or deconditioned;Inadequate oral hygiene (? appearance of coating on tongue ? oral candidiasis)  Swallow Evaluation Recommendations Recommendations: PO diet PO Diet  Recommendation: Dysphagia 3 (Mechanical soft);Dysphagia 2 (Finely chopped);Thin liquids (Level 0) Liquid Administration via: Straw;Cup Medication Administration: Whole meds with liquid (whole if small) Supervision: Patient able to self-feed Swallowing strategies  : Slow rate;Small bites/sips;Follow solids with liquids (start intake with liquids, follow all bites of food swallowed with sips of liquids) Postural changes: Position pt fully upright for meals;Stay upright 30-60 min after meals Oral care recommendations: Oral care BID (2x/day)    [name], MS [facility] SLP Acute Rehab Services Office [phone]   [name] 01/05/2024,9:13 AM

## 2024-01-05 NOTE — Progress Notes (Signed)
 Speech Language Pathology Treatment: Dysphagia  Patient Details Name: Rhonda Marshall MRN: [id number] DOB: 1951-02-08 Today's Date: 01/05/2024 Time: 1030-1040 SLP Time Calculation (min) (ACUTE ONLY): 10 min  Assessment / Plan / Recommendation Clinical Impression  Pt seen for follow up dysphagia management to discuss results of testing with her family, establishing baseline function and helpful compensation strategies. Pt reports she eats softer/minced foods at home and states she managed a sandwich the other day. She was observed with thin, applesauce and graham crackers dipped in applesauce. No clinical indication of aspiration with adequate oral clearance. Pt reports she does not use dentures as they are loose and use of adhesive makes it difficult for her to remove them Advised she consider trying wax. At this time, will advance to dys3/thin - and will contact dietician for pureed meats/extra gravy/sauces. Will follow up briefly to assure pt is managing. Of note, son, Rhonda Marshall, reported facial asymmetry when observed per SlP request, but he is unable to advise if this is new.   Pt reports she had 30 days of XRT for her laryngeal cancer in 2021.     HPI HPI: Patient is a 73 year old female history of chronic A-fib on Eliquis , hypertension, rheumatoid arthritis, hypothyroidism, HFrEF who admitted to the hospital with volume overload secondary to acute on chronic CHF exacerbation, severe sepsis due to multifocal pneumonia and concern for aspiration pneumonia.  Patient placed on IV antibiotics.  Patient received a dose of IV Lasix  however due to hypotension/soft blood pressure diuretics held.  Cardiology consulted.  GI also consulted due to concerns for debris in the esophagus.  ID consulted patient maintained on IV antibiotics likely will require 6 weeks of IV antibiotics to cover for empiric endocarditis as well.     She underwent MBS and follow up indicated.      SLP Plan  Continue with current  plan of care          Recommendations  Diet recommendations: Dysphagia 3 (mechanical soft);Thin liquid Liquids provided via: Straw Medication Administration: Other (Comment) (with liquids or puree, start and follow with liquids) Compensations: Slow rate;Small sips/bites Postural Changes and/or Swallow Maneuvers: Seated upright 90 degrees;Upright 30-60 min after meal                  Oral care BID   Other (comment) Dysphagia, pharyngeal phase (R13.13)     Continue with current plan of care    Rhonda POUR, MS [facility] SLP Acute Rehab Services Office [phone]  Rhonda Marshall  01/05/2024, 10:52 AM

## 2024-01-05 NOTE — Progress Notes (Addendum)
 PROGRESS NOTE    Rhonda Marshall  FMW:[id number] DOB: May 09, 1951 DOA: 12/31/2023 PCP: [name] LABOR, MD    Chief Complaint  Patient presents with   Dysphagia    Brief Narrative: Patient is a 73 year old female history of chronic A-fib on Eliquis , hypertension, rheumatoid arthritis, hypothyroidism, HFrEF who admitted to the hospital with volume overload secondary to acute on chronic CHF exacerbation, severe sepsis due to multifocal pneumonia and concern for aspiration pneumonia.  Patient placed on IV antibiotics.  Patient received a dose of IV Lasix  however due to hypotension/soft blood pressure diuretics held.  Cardiology consulted.  GI also consulted due to concerns for debris in the esophagus.  ID consulted patient maintained on IV antibiotics likely will require 6 weeks of IV antibiotics to cover for empiric endocarditis as well.   Assessment & Plan:   Principal Problem:   Severe sepsis with septic shock (CODE) (HCC) Active Problems:   Acute on chronic systolic CHF (congestive heart failure) (HCC)   Sepsis due to pneumonia (HCC)   Bacteremia   Hyperthyroidism   Hypotension   AKI (acute kidney injury) (HCC)   Hyponatremia   Acute respiratory failure with hypoxia (HCC)   CKD (chronic kidney disease) stage 3, GFR 30-59 ml/min (HCC)   Dysphagia   Aspiration pneumonia of both lungs (HCC)   Bilateral pleural effusion   Lactic acidosis   Permanent atrial fibrillation (HCC)   Lethargy  #1 severe sepsis likely secondary to multifocal pneumonia/aspiration pneumonia and Enterococcus [name] bacteremia with septic shock/hypotension - Patient on admission met criteria for sepsis with hypothermia, tachycardia, initial lactate at 3.2 trending down with source of sepsis being multifocal/aspiration pneumonia and bacteremia. - CT neck and CT chest done concerning for debris in the esophagus as well as diffuse bilateral bronchopneumonia with bilateral lower lobe consolidation, moderate  right and small left layering pleural effusions with simple fluid density favoring transudate.  Body wall anasarca noted. -.  2/4 blood cultures with Enterococcus hirae. BCID negative. -MRSA PCR negative. -Repeat blood cultures ordered with no growth to date. - SLP following evaluated patient and patient on dysphagia 1 diet. - Continue empiric IV Zosyn . - Patient with persistent hypotension early on in the hospitalization, patient in acute CHF exacerbation and as such difficulty diuresing and unable to give fluid boluses. -Repeat lactic acid level elevated but trending down, procalcitonin at 0.26 and 0.24. -Due to persistent hypotension patient placed on Levophed  drip on 01/01/2024 which is subsequently being weaned off with improvement with blood pressure. -Repeat chest x-ray on 01/02/2024 with enlarged heart with vascular congestion and edema.  Right greater than left small pleural effusions.  -Right upper quadrant ultrasound with sick gallbladder with some wall thickening and wall edema but no stones.  Wall edema and thickening is nonspecific in the presence of ascites.  No biliary ductal dilatation.  Pleural effusion.  -Patient seen in consultation by ID who recommended TEE. -Cardiology informed of need for TEE, which was initially planned for 01/04/2024 however canceled by cardiology as per cardiology might not change her treatment as she is not likely a candidate for valve surgery with her multiple comorbidities and recommending 6 weeks of IV antibiotics. - Patient being followed by ID who have transition from IV Zosyn  to IV ampicillin and Rocephin  and recommending 6-week course of empiric IV antibiotics to cover for empiric endocarditis. -Repeat blood cultures with no growth to date. -Will defer timing of PICC line placement to ID. - Per ID will need colonoscopy in the outpatient setting  as source of bacteremia remains unclear.. -PCCM was consulted, but have signed off as of 01/02/2024. -ID  following and appreciate input and recommendations..  2.  Acute respiratory failure with hypoxia - Mild likely secondary to multifocal pneumonia as well as acute on chronic CHF exacerbation in the setting of associated pleural effusion and volume overload. - Continue treatment for pneumonia with IV antibiotics as well as heart failure. - Limited use of diuretics early on in the hospitalization due to soft/low blood pressure. -Status post Levophed  which was started on 01/01/2024 with improvement with blood pressure. -Levophed  has been weaned off. -BP improved. - Patient diuresing well on IV Lasix .   - Discontinue IV Lasix  and transition back to home regimen of oral Lasix  twice daily.   - Wean O2.  - Cardiology following.   - BiPAP as needed.   3.  Lactic acidosis -Likely secondary to problem #1. - See problem #1.  4.  Acute on chronic HFrEF -2D echo done with a EF of 30 to 35%, global hypokinesis left ventricle, grade 2 DD, moderately dilated left atrial size, moderately dilated right atrial size, moderate MVR. - Patient noted to have received a dose of Lasix  20 mg IV x 1 on admission with urine output not recorded initially. -Patient with urine output of 2.225 L over the past 24 hours. -Patient placed on pressors on 01/01/2024 with improvement with blood pressure, pressors discontinued the morning of 01/02/2024. - BP has improved and as such patient was started on low-dose Lasix  20 mg IV every 12 hours. -Patient with urine output of 2.5 L over the past 24 hours. - Strict I's and O's. - Due to complicated cardiac history, soft/low blood pressure with difficulty diuresing in the setting of acute CHF exacerbation cardiology was consulted and are following.   -Likely discontinue IV Lasix  today and placed on Lasix  20 mg p.o. twice daily as per caretaker/daughter-in-law this was what was instructed to her by patient's primary cardiologist, Dr. Lavona.  - Per cardiology.  5.  Chronic  hyponatremia - Patient noted to have previously been on salt tablets which was discontinued as patient was not taking diuretics of Lasix  on a daily basis. - Current course of hyponatremia likely secondary to hypervolemic hyponatremia. - Due to soft/low blood pressure unable to place on IV diuretics early on in the hospitalization.   - Hyponatremia improved since admission after receiving a dose of IV Lasix . - Continue to hold salt tablets. -Hyponatremia improving with diuresis. - Discontinue IV Lasix . - Follow.  6.  Debris in esophagus/dysphagia -Noted on CT soft tissue neck and CT chest. - Patient presented with severe sepsis with concerns for bilateral pneumonia/aspiration pneumonia. - Concern as to whether patient may actually be able to tolerate oral pills and whether they are being absorbed due to debris noted in the esophagus. - GI consulted Home at this time does not feel patient has esophageal food impaction but likely may have a esophageal dysmotility issue. - SLP following. - Eliquis  discontinued patient placed on Lovenox  for now pending possible procedure in house. - GI recommending trial of diet recommended by SLP and if patient tolerates diet over the weekend  recommend an outpatient endoscopy once patient has recovered from pneumonia. -Patient tolerating current dysphagia 1 diet and per GI advance as recommended by SLP. - Per GI if patient vomits or unable to tolerate diet then will need to consider inpatient endoscopy at this time. -Patient underwent modified barium swallow this morning and diet likely to  be advanced to a dysphagia 2 versus dysphagia 3 diet. - GI was following.  7.  Chronic A-fib -Currently rate controlled on IV Lopressor . -Resume home regimen Toprol -XL 100 mg daily. -Discontinue IV Lopressor . - Continue amiodarone . - Eliquis  held and patient placed on full dose Lovenox  for now due to problem #6 and concern for possible in-house procedure. - Resume Eliquis   as no further procedures planned at this time.   - Cardiology following.   8.  Hyperthyroidism -Continue home regimen Tapazole . - Resume home regimen Toprol -XL 100 mg daily.   - DC IV Lopressor .  - Outpatient follow-up with primary endocrinologist.  9.  AKI on CKD stage IIIa - Baseline creatinine approximately 1.2. - Likely secondary to acute CHF exacerbation in the setting of low blood pressure. - Patient received a dose of IV Lasix  with some improvement in renal function. -Patient with urine output of 2.5 L over the past 24 hours. -Renal function slowly improving. - Discontinue IV Lasix .  - Avoid nephrotoxins.  10.  Bilateral pleural effusion -Likely secondary to volume overload. - Unable to diurese due to soft/low blood pressure early on in the hospitalization.   -BP improving. - Repeat chest x-ray done the morning of 01/02/2024, with enlarged heart with vascular congestion and edema.  Right greater than left small pleural effusions.  - Patient assessed by PCCM who currently recommending holding off on thoracentesis as bilateral pleural effusions and ascites likely due to decompensated CHF. -Patient with urine output of 2.5 L over the past 24 hours - Currently on Lasix  20 mg IV every 12 hours.   - Will discontinue IV Lasix  and monitor off diuretics and discussion with cardiology.   - Cardiology following.   11.  Hypotension/soft blood pressure -Patient with soft blood pressure/hypotension early on in the. - Likely secondary to problem #1. - Difficulty with diuresing early on in the hospitalization, due to soft blood pressure/hypotension. - Status post IV albumin  x 1.   - Cortisol level at 11.4.  - Lactic acid level was elevated at 2.8 and fluctuating.   - Procalcitonin at 0.26 and 0.24 and now 0.32.  - Patient was placed on Levophed  gtt on 01/01/2024, which has subsequently been weaned off the morning of 01/02/2024.  -BP improved. - Continue empiric IV antibiotics.  -Monitor  BP with IV diuretics -See problem #1.  12.  Hypokalemia/hypophosphatemia -Potassium at 3.8, phosphorus at 2.8. - K-Phos 250 mg p.o. twice daily x 1 days. - Repeat labs in the AM.  13. Lethargy/Drowiness - Patient noted with an episode of lethargy and drowsiness the afternoon of 01/03/2024. - CT head done negative. - ABG, CBC, c-Met, ammonia levels with no significant abnormalities noted. - Patient improved clinically and back to baseline. - No further workup needed.  14.pressure injury, POA Pressure Injury 08/16/23 Toe (Comment  which one) Anterior;Left Unstageable - Full thickness tissue loss in which the base of the injury is covered by slough (yellow, tan, gray, green or brown) and/or eschar (tan, brown or black) in the wound bed. (Active)  08/16/23 9141  Location: Toe (Comment  which one)  Location Orientation: Anterior;Left (Second)  Staging: Unstageable - Full thickness tissue loss in which the base of the injury is covered by slough (yellow, tan, gray, green or brown) and/or eschar (tan, brown or black) in the wound bed.  Wound Description (Comments):   DO NOT USE:  Present on Admission: Yes (Patient states those are from my shoes I think)  DVT prophylaxis: Lovenox >>> Eliquis  Code Status: Full Family Communication: Updated patient.  Updated daughter-in-law/primary caretaker Analaya Hoey) on telephone. Disposition: Home with home health when clinically stable and cleared by ID and cardiology.  Status is: Inpatient Remains inpatient appropriate because: Severity of illness   Consultants:  Cardiology: Dr. Michele 01/01/2024 Gastroenterology: Dr. Burnette 01/01/2024 PCCM: Dr.Joshi 01/01/2024 ID: Dr. Dennise 01/01/2024  Procedures:  CT soft tissue neck 12/31/2023 CT maxillofacial 12/31/2023 Chest x-ray 12/31/2023, 01/02/2024 2D echo 01/01/2024 CT chest abdomen and pelvis 12/31/2023 Right upper quadrant ultrasound 01/02/2024 CT head 01/03/2024 MBS  01/05/2024  Antimicrobials:  Anti-infectives (From admission, onward)    Start     Dose/Rate Route Frequency Ordered Stop   01/04/24 1200  ampicillin (OMNIPEN) 2 g in sodium chloride  0.9 % 100 mL IVPB        2 g 300 mL/hr over 20 Minutes Intravenous Every 6 hours 01/04/24 1014     01/04/24 1100  cefTRIAXone  (ROCEPHIN ) 2 g in sodium chloride  0.9 % 100 mL IVPB        2 g 200 mL/hr over 30 Minutes Intravenous Every 12 hours 01/04/24 1014     12/31/23 0800  piperacillin -tazobactam (ZOSYN ) IVPB 3.375 g  Status:  Discontinued        3.375 g 12.5 mL/hr over 240 Minutes Intravenous Every 8 hours 12/31/23 0738 01/04/24 1014   12/31/23 0145  piperacillin -tazobactam (ZOSYN ) IVPB 3.375 g        3.375 g 100 mL/hr over 30 Minutes Intravenous  Once 12/31/23 0143 12/31/23 0245         Subjective: Sitting up in bed drinking her coffee.  Stated just finished modified barium swallow/swallowing test.  States she was able to swallow the pill.  Denies any chest pain or shortness of breath.  No abdominal pain.  Son at bedside.   Objective: Vitals:   01/05/24 0600 01/05/24 0700 01/05/24 0800 01/05/24 0900  BP: 129/83 122/82 (!) 129/92 (!) 140/92  Pulse: (!) 102 87 (!) 119 97  Resp: 17 19 17  (!) 27  Temp:   98.7 F (37.1 C)   TempSrc:   Oral   SpO2: 100% 99% 92% 100%  Weight:      Height:        Intake/Output Summary (Last 24 hours) at 01/05/2024 1011 Last data filed at 01/05/2024 0900 Gross per 24 hour  Intake 793.32 ml  Output 2425 ml  Net -1631.68 ml   Filed Weights   12/31/23 0426 01/03/24 0955 01/04/24 0500  Weight: 49.4 kg 51.1 kg 50.5 kg    Examination:  General exam: NAD. Respiratory system: Decreased bibasilar crackles.  Decreased coarse breath sounds.  No wheezing.  Fair air movement.  On 3 L nasal cannula with sats of 100%.  Cardiovascular system: Irregularly irregular.  No JVD, no murmurs rubs or gallops.  No pitting lower extremity edema.   Gastrointestinal system: Abdomen  is soft, nontender, nondistended, positive bowel sounds.  No rebound.  No guarding. Central nervous system: Alert and oriented. No focal neurological deficits. Extremities: Symmetric 5 x 5 power. Skin: No rashes, lesions or ulcers Psychiatry: Judgement and insight appear normal. Mood & affect appropriate.     Data Reviewed: I have personally reviewed following labs and imaging studies  CBC: Recent Labs  Lab 01/02/24 0308 01/03/24 0218 01/03/24 1553 01/04/24 0438 01/05/24 0314  WBC 7.3 6.3 7.3 6.1 6.1  NEUTROABS 6.1 5.5 5.8 4.3 3.9  HGB 10.6* 11.5* 12.6 11.6* 11.3*  HCT 34.2* 37.1 39.8 38.5 37.1  MCV 93.4 94.2 90.7 93.2 94.6  PLT 166 154 170 152 163    Basic Metabolic Panel: Recent Labs  Lab 01/01/24 0828 01/02/24 0308 01/03/24 0218 01/03/24 1553 01/04/24 0438 01/05/24 0314  NA 133* 131* 132* 131* 136 134*  K 4.6 3.5 3.4* 3.6 3.7 3.8  CL 96* 93* 96* 95* 99 93*  CO2 25 26 24 28 27 28   GLUCOSE 72 157* 145* 113* 88 82  BUN 29* 27* 23 23 25* 27*  CREATININE 1.45* 1.27* 1.06* 0.93 1.02* 1.10*  CALCIUM 9.0 8.8* 9.1 8.5* 8.6* 8.8*  MG 1.9 1.7 1.7  --  2.0 2.1  PHOS  --  2.6 2.4*  --  2.8  --     GFR: Estimated Creatinine Clearance: 35.5 mL/min (A) (by C-G formula based on SCr of 1.1 mg/dL (H)).  Liver Function Tests: Recent Labs  Lab 01/01/24 0828 01/02/24 0308 01/03/24 0218 01/03/24 1553 01/04/24 0438  AST 43* 33 36 29  --   ALT 36 26 27 23   --   ALKPHOS 174* 152* 161* 141*  --   BILITOT 2.1* 1.7* 2.0* 1.6*  --   PROT 6.3* 6.1* 6.2* 5.9*  --   ALBUMIN  2.4* 3.0* 2.7* 2.3* 2.2*    CBG: Recent Labs  Lab 01/03/24 1530  GLUCAP 133*     Recent Results (from the past 240 hours)  Blood culture (routine x 2)     Status: Abnormal   Collection Time: 12/31/23  2:00 AM   Specimen: BLOOD  Result Value Ref Range Status   Specimen Description   Final    BLOOD LEFT ANTECUBITAL Performed at [facility], [address] [place], [place]  [address]    Special Requests   Final    BOTTLES DRAWN AEROBIC AND ANAEROBIC Blood Culture results may not be optimal due to an inadequate volume of blood received in culture bottles Performed at [facility], [address], [place], [place] [address]    Culture  Setup Time   Final    GRAM POSITIVE COCCI IN PAIRS IN CHAINS IN BOTH AEROBIC AND ANAEROBIC BOTTLES CRITICAL RESULT CALLED TO, READ BACK BY AND VERIFIED WITH: PHARMD EMERSON MILLET [id number] @ 2212 FH    Culture (A)  Final    ENTEROCOCCUS [name] STAPHYLOCOCCUS EPIDERMIDIS THE SIGNIFICANCE OF ISOLATING THIS ORGANISM FROM A SINGLE SET OF BLOOD CULTURES WHEN MULTIPLE SETS ARE DRAWN IS UNCERTAIN. PLEASE NOTIFY THE MICROBIOLOGY DEPARTMENT WITHIN ONE WEEK IF SPECIATION AND SENSITIVITIES ARE REQUIRED. Performed at [facility], [address] [place], [place] [address]    Report Status 01/02/2024 FINAL  Final   Organism ID, Bacteria ENTEROCOCCUS [name]  Final      Susceptibility   Enterococcus hirae - MIC*    AMPICILLIN <=2 SENSITIVE Sensitive     VANCOMYCIN  <=0.5 SENSITIVE Sensitive     GENTAMICIN SYNERGY SENSITIVE Sensitive     * ENTEROCOCCUS [name]  Blood Culture ID Panel (Reflexed)     Status: None   Collection Time: 12/31/23  2:00 AM  Result Value Ref Range Status   Enterococcus faecalis NOT DETECTED NOT DETECTED Final   Enterococcus Faecium NOT DETECTED NOT DETECTED Final   Listeria monocytogenes NOT DETECTED NOT DETECTED Final   Staphylococcus species NOT DETECTED NOT DETECTED Final   Staphylococcus aureus (BCID) NOT DETECTED NOT DETECTED Final   Staphylococcus epidermidis NOT DETECTED NOT DETECTED Final   Staphylococcus lugdunensis NOT DETECTED NOT DETECTED Final   Streptococcus species NOT  DETECTED NOT DETECTED Final   Streptococcus agalactiae NOT DETECTED NOT DETECTED Final   Streptococcus pneumoniae NOT DETECTED NOT DETECTED Final   Streptococcus pyogenes NOT DETECTED NOT DETECTED Final    A.calcoaceticus-baumannii NOT DETECTED NOT DETECTED Final   Bacteroides fragilis NOT DETECTED NOT DETECTED Final   Enterobacterales NOT DETECTED NOT DETECTED Final   Enterobacter cloacae complex NOT DETECTED NOT DETECTED Final   Escherichia coli NOT DETECTED NOT DETECTED Final   Klebsiella aerogenes NOT DETECTED NOT DETECTED Final   Klebsiella oxytoca NOT DETECTED NOT DETECTED Final   Klebsiella pneumoniae NOT DETECTED NOT DETECTED Final   Proteus species NOT DETECTED NOT DETECTED Final   Salmonella species NOT DETECTED NOT DETECTED Final   Serratia marcescens NOT DETECTED NOT DETECTED Final   Haemophilus influenzae NOT DETECTED NOT DETECTED Final   Neisseria meningitidis NOT DETECTED NOT DETECTED Final   Pseudomonas aeruginosa NOT DETECTED NOT DETECTED Final   Stenotrophomonas maltophilia NOT DETECTED NOT DETECTED Final   Candida albicans NOT DETECTED NOT DETECTED Final   Candida auris NOT DETECTED NOT DETECTED Final   Candida glabrata NOT DETECTED NOT DETECTED Final   Candida krusei NOT DETECTED NOT DETECTED Final   Candida parapsilosis NOT DETECTED NOT DETECTED Final   Candida tropicalis NOT DETECTED NOT DETECTED Final   Cryptococcus neoformans/gattii NOT DETECTED NOT DETECTED Final    Comment: Performed at [facility], [address], [place], [place] [address]  Blood culture (routine x 2)     Status: None   Collection Time: 12/31/23  2:15 AM   Specimen: BLOOD  Result Value Ref Range Status   Specimen Description   Final    BLOOD RIGHT ANTECUBITAL Performed at [facility], [address] [place], [place] [address]    Special Requests   Final    BOTTLES DRAWN AEROBIC AND ANAEROBIC Blood Culture adequate volume Performed at [facility], [address] [place], [place] [address]    Culture   Final    NO GROWTH 5 DAYS Performed at [facility], [address] [place], [place] [address]    Report Status 01/05/2024 FINAL   Final  MRSA Next Gen by PCR, Nasal     Status: None   Collection Time: 12/31/23  9:14 AM   Specimen: Nasal Mucosa; Nasal Swab  Result Value Ref Range Status   MRSA by PCR Next Gen NOT DETECTED NOT DETECTED Final    Comment: (NOTE) The GeneXpert MRSA Assay (FDA approved for NASAL specimens only), is one component of a comprehensive MRSA colonization surveillance program. It is not intended to diagnose MRSA infection nor to guide or monitor treatment for MRSA infections. Test performance is not FDA approved in patients less than 23 years old. Performed at [facility], [address] [place], [place] [address]   Urine Culture (for pregnant, neutropenic or urologic patients or patients with an indwelling urinary catheter)     Status: None   Collection Time: 01/01/24 10:37 AM   Specimen: Urine, Catheterized  Result Value Ref Range Status   Specimen Description   Final    URINE, CATHETERIZED Performed at [facility], [address] [place], [place] [address]    Special Requests   Final    NONE Performed at [facility], [address], [place], [place] [address]    Culture   Final    NO GROWTH Performed at [facility], [address], [place],  [place] [address]    Report Status 01/02/2024 FINAL  Final  Culture, blood (Routine X 2) w Reflex to ID Panel     Status: None (Preliminary result)   Collection Time: 01/02/24  7:26 AM   Specimen: BLOOD LEFT HAND  Result Value Ref Range Status   Specimen Description   Final    BLOOD LEFT HAND Performed at [facility], [address], [place], [place] [address]    Special Requests   Final    BOTTLES DRAWN AEROBIC ONLY Blood Culture results may not be optimal due to an inadequate volume of blood received in culture bottles Performed at [facility], [address], [place], [place] [address]    Culture   Final    NO GROWTH 3 DAYS Performed at  [facility], [address] [place], [place] [address]    Report Status PENDING  Incomplete  Culture, blood (Routine X 2) w Reflex to ID Panel     Status: None (Preliminary result)   Collection Time: 01/02/24  7:32 AM   Specimen: BLOOD RIGHT ARM  Result Value Ref Range Status   Specimen Description   Final    BLOOD RIGHT ARM Performed at [facility], [address], [place], [place] [address]    Special Requests   Final    BOTTLES DRAWN AEROBIC ONLY Blood Culture results may not be optimal due to an inadequate volume of blood received in culture bottles Performed at [facility], [address], [place], [place] [address]    Culture   Final    NO GROWTH 3 DAYS Performed at [facility], [address], [place], [place] [address]    Report Status PENDING  Incomplete         Radiology Studies: DG Swallowing Func-Speech Pathology Result Date: 01/05/2024 Table formatting from the original result was not included. Modified Barium Swallow Study Patient Details Name: Breely Panik MRN: [id number] Date of Birth: February 14, 1951 Today's Date: 01/05/2024 HPI/PMH: HPI: Pt is a 73 yo female with recent fall on 6/16, now presenting 6/19 with dysphagia and dyspnea. Admitted with volume overload due to chronic heart failure as well as severe sepsis due to multifocal PNA. Imaging also revealed debris within the esophagus. Pt has been evaluated by SLP in the past (clinical evaluations in 2021, 2023, 2024) with some concern for ability to masticate but without overt concern for pharyngeal dysphagia or aspiration. 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  Oropharyngeal swallow is largely functional - aside from mild difficulties to masticate solids due  edentulous status.   Pharyngeal motlity including epiglottic deflection and airway closure is adequate fortunately with only trace retention. Barium tablet given with thin easily transited through oropharynx.  MBS does not diagnose below PES however after puree/solid pt appeared with retention - which appeared effectively cleared with thin barium.  Recommend consider advancing diet as pt desires to maximize her intake using compensation strategies for dysphagia mitigation.  SlP will follow up briefly for compensation strategy review with pt for likely esophageal dysmotility per GI. Factors that may increase risk of adverse event in presence of aspiration Noe & Lianne 2021): Factors that may increase risk of adverse event in presence of aspiration Noe & Lianne 2021): Limited mobility; Frail or deconditioned; Inadequate oral hygiene (?  appearance of coating on tongue ? oral candidiasis) Recommendations/Plan: Swallowing Evaluation Recommendations Swallowing Evaluation Recommendations Recommendations: PO diet PO Diet Recommendation: Dysphagia 3 (Mechanical soft); Dysphagia 2 (Finely chopped); Thin liquids (Level 0) Liquid Administration via: Straw; Cup Medication Administration: Whole meds with liquid (whole if small) Supervision: Patient able to self-feed Swallowing strategies  : Slow rate; Small bites/sips; Follow solids with liquids (start intake with liquids, follow all bites of food swallowed with sips of liquids) Postural changes: Position pt fully upright for meals; Stay upright 30-60 min after meals Oral care recommendations: Oral care BID (2x/day) Treatment Plan Treatment Plan Treatment recommendations: Therapy as outlined in treatment plan below Follow-up recommendations: No SLP follow up Functional status assessment: Patient has had a recent decline in their functional status and demonstrates the ability to make significant improvements in function in a reasonable and predictable amount of time.  Treatment frequency: Min 1x/week Treatment duration: 1 week Interventions: Aspiration precaution training; Compensatory techniques; Diet toleration management by SLP Recommendations Recommendations for follow up therapy are one component of a multi-disciplinary discharge planning process, led by the attending physician.  Recommendations may be updated based on patient status, additional functional criteria and insurance authorization. Assessment: Orofacial Exam: Orofacial Exam Oral Cavity: Oral Hygiene: Other (comment) (slight white coating on tongue) Oral Cavity - Dentition: Edentulous (has dentures that she does not use for eating) Oral Motor/Sensory Function: Other (comment) (noted left facial asymmetry - which pt attributes to swelling on the right from her fall) Anatomy: Anatomy: WFL Boluses Administered: Boluses Administered Boluses Administered: Thin liquids (Level 0); Mildly thick liquids (Level 2, nectar thick); Puree; Solid  Oral Impairment Domain: Oral Impairment Domain Lip Closure: No labial escape Tongue control during bolus hold: Posterior escape of less than half of bolus Bolus preparation/mastication: Disorganized chewing/mashing with solid pieces of bolus unchewed Bolus transport/lingual motion: Brisk tongue motion Oral residue: Trace residue lining oral structures Location of oral residue : Tongue Initiation of pharyngeal swallow : Pyriform sinuses; Valleculae  Pharyngeal Impairment Domain: Pharyngeal Impairment Domain Soft palate elevation: No bolus between soft palate (SP)/pharyngeal wall (PW) Anterior hyoid excursion: Complete anterior movement Epiglottic movement: Complete inversion Laryngeal vestibule closure: Complete, no air/contrast in laryngeal vestibule Pharyngeal stripping wave : Present - complete Pharyngeal contraction (A/P view only): N/A Pharyngoesophageal segment opening: Complete distension and complete duration, no obstruction of flow Tongue base retraction: No contrast between  tongue base and posterior pharyngeal wall (PPW) Pharyngeal residue: Complete pharyngeal clearance Location of pharyngeal residue: Pyriform sinuses  Esophageal Impairment Domain: Esophageal Impairment Domain Esophageal clearance upright position: Esophageal retention (barium tablet taken with thin appeared retained in esophagus, water  appeared to transit it distally; but without full clearance; pt did NOT sense) Pill: Pill Consistency administered: Thin liquids (Level 0) Thin liquids (Level 0): [facility] Penetration/Aspiration Scale Score: Penetration/Aspiration Scale Score 1.  Material does not enter airway: Puree; Solid; Pill; Mildly thick liquids (Level 2, nectar thick) 2.  Material enters airway, remains ABOVE vocal cords then ejected out: Thin liquids (Level 0) Compensatory Strategies: Compensatory Strategies Compensatory strategies: Yes Chin tuck: -- (tested, continued with trace penetration) Other(comment): Effective (liquid wash to help to appear to transit puree/solid)   General Information: Caregiver present: No  Diet Prior to this Study: Dysphagia 1 (pureed); Thin liquids (Level 0)   Temperature : Normal   Respiratory Status: WFL   Supplemental O2: Nasal cannula   History of Recent Intubation: No  Behavior/Cognition: Alert; Cooperative; Pleasant mood Self-Feeding Abilities: Able to self-feed (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) No data recorded Volitional Cough: Able to elicit Volitional Swallow: Able to elicit No data recorded Goal Planning: Prognosis for improved oropharyngeal function: Good No data recorded No data recorded No data recorded No data recorded Pain: Pain Assessment Pain Assessment: No/denies pain Faces Pain Scale: 0 End of Session: Start Time:SLP Start Time (ACUTE ONLY): 0816 Stop Time: SLP Stop Time (ACUTE ONLY): 0840 Time Calculation:SLP Time Calculation (min) (ACUTE ONLY): 24 min Charges: SLP Evaluations $ SLP Speech Visit: 1 Visit SLP Evaluations $MBS Swallow: 1 Procedure  $Swallowing Treatment: 1 Procedure SLP visit diagnosis: SLP Visit Diagnosis: Dysphagia, unspecified (R13.10) Past Medical History: Past Medical History: Diagnosis Date  Anxiety   Atrial fibrillation (HCC)   newly diagnosed 10/2010  Family history of adverse reaction to anesthesia   Sister's BP drops   Hip fracture (HCC) 01/18/2022  Hypertension   Rheumatoid arthritis(714.0)   Subclinical hyperthyroidism  Past Surgical History: Past Surgical History: Procedure Laterality Date  Arm surgery    fracture, right arm  CARDIOVERSION N/A 08/10/2014  Procedure: CARDIOVERSION;  Surgeon: Leim VEAR Moose, MD;  Location: [facility] ENDOSCOPY;  Service: Cardiovascular;  Laterality: N/A;  CARDIOVERSION N/A 01/22/2022  Procedure: CARDIOVERSION;  Surgeon: Hobart Powell BRAVO, MD;  Location: [facility] ENDOSCOPY;  Service: Cardiovascular;  Laterality: N/A;  CATARACT EXTRACTION    DIRECT LARYNGOSCOPY N/A 09/02/2019  Procedure: DIRECT LARYNGOSCOPY WITH BIOPSY;  Surgeon: Ethyl Lonni BRAVO, MD;  Location:  SURGERY CENTER;  Service: ENT;  Laterality: N/A;  HIP ARTHROPLASTY Right 01/19/2022  Procedure: ARTHROPLASTY BIPOLAR HIP (HEMIARTHROPLASTY);  Surgeon: Josefina Chew, MD;  Location: [facility] ORS;  Service: Orthopedics;  Laterality: Right;  IR GASTROSTOMY TUBE MOD SED  10/17/2019  IR GASTROSTOMY TUBE REMOVAL  01/03/2020  TEE WITHOUT CARDIOVERSION N/A 01/22/2022  Procedure: TRANSESOPHAGEAL ECHOCARDIOGRAM (TEE);  Surgeon: Hobart Powell BRAVO, MD;  Location: [facility] ENDOSCOPY;  Service: Cardiovascular;  Laterality: N/A;  TUBAL LIGATION   [name], MS [facility] SLP Acute Rehab Services Office [phone] [name] 01/05/2024, 9:17 AM  CT HEAD WO CONTRAST ( ) Result Date: 01/03/2024 CLINICAL DATA:  Altered mental status EXAM: CT HEAD WITHOUT CONTRAST TECHNIQUE: Contiguous axial images were obtained from the base of the skull through the vertex without intravenous contrast. RADIATION DOSE REDUCTION: This exam was performed according to the departmental  dose-optimization program which includes automated exposure control, adjustment of the mA and/or kV according to patient size and/or use of iterative reconstruction technique. COMPARISON:  None Available. FINDINGS: Brain: There is no mass, hemorrhage or extra-axial collection. The size and configuration of the ventricles and extra-axial CSF spaces are normal. There is hypoattenuation of the white matter, most commonly indicating chronic small vessel disease. Vascular: No hyperdense vessel or unexpected vascular calcification. Skull: The visualized skull base, calvarium and extracranial soft tissues are normal. Sinuses/Orbits: No fluid levels or advanced mucosal thickening of the visualized paranasal sinuses. No mastoid or middle ear effusion. Normal orbits. Other: None. IMPRESSION: 1. No acute intracranial abnormality. 2. Chronic small vessel disease. Electronically Signed   By: Franky Stanford M.D.   On: 01/03/2024 20:08        Scheduled Meds:  amiodarone   100 mg Oral Daily   apixaban   5 mg Oral BID   Chlorhexidine  Gluconate Cloth  6 each Topical Daily   feeding supplement  237 mL Oral BID BM   furosemide   20 mg Intravenous BID   leptospermum manuka honey  1 Application Topical Daily   methimazole   2.5 mg Oral Daily   metoprolol  tartrate  5 mg  Intravenous Q6H   phosphorus  250 mg Oral BID   Continuous Infusions:  ampicillin (OMNIPEN) IV Stopped (01/05/24 0630)   cefTRIAXone  (ROCEPHIN )  IV Stopped (01/04/24 2317)     LOS: 5 days    Time spent: 45 minutes    Toribio Hummer, MD [facility]   To contact the attending provider between 7A-7P or the covering provider during after hours 7P-7A, please log into the web site [email or web address] and access using universal [place] password for that web site. If you do not have the password, please call the hospital operator.  01/05/2024, 10:11 AM

## 2024-01-05 NOTE — Progress Notes (Signed)
  Progress Note  Patient Name: Rhonda Marshall Date of Encounter: 01/05/2024 [facility] Cardiologist: Lynwood Schilling, MD   Interval Summary   A little stronger. Walked a few steps in room. Good UO after diuretics.  Vital Signs Vitals:   01/05/24 0600 01/05/24 0700 01/05/24 0800 01/05/24 0900  BP: 129/83 122/82 (!) 129/92 (!) 140/92  Pulse: (!) 102 87 (!) 119 97  Resp: 17 19 17  (!) 27  Temp:   98.7 F (37.1 C)   TempSrc:   Oral   SpO2: 100% 99% 92% 100%  Weight:      Height:        Intake/Output Summary (Last 24 hours) at 01/05/2024 1029 Last data filed at 01/05/2024 0900 Gross per 24 hour  Intake 793.32 ml  Output 2425 ml  Net -1631.68 ml      01/04/2024    5:00 AM 01/03/2024    9:55 AM 12/31/2023    4:26 AM  Last 3 Weights  Weight (lbs) 111 lb 5.3 oz 112 lb 10.5 oz 109 lb  Weight (kg) 50.5 kg 51.1 kg 49.442 kg      Telemetry/ECG  AFib, rate controlled - Personally Reviewed  Physical Exam  GEN: No acute distress.  Frail. Ecchymosis under chin after fall Neck: 8 cm JVD Cardiac: irregular, paradoxically split S2, faint holosystolic murmur LLSB, no apical murmur heard and no diastolic murmurs, rubs, or gallops.  Respiratory: Clear to auscultation bilaterally. GI: Soft, nontender, non-distended  MS: No edema  Assessment & Plan  73 yo woman with sepsis syndrome and E. 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 Developed volume overload after treatment for sepsis, but now with good response to diuretics.  Improving rapidly. Hemodynamically stable, afebrile, WBC normalized.  She has evidence of hypervolemia (JVD) and had a markedly elevated BNP, but has no complaints of dyspnea. Agree with scheduled diuretics, but I think we can administer those PO. Monitor for recurrent hyponatremia. Limit fluid  intake to 1800 ml/24h.  Per previous reports: She was not put on Farxiga because of urinary tract infections. Entresto  and spironolactone  had to be stopped secondary to hypotension. We might try a very low dose of ARB. Consider CRT-P as OP.   Clinically euthyroid and normal TSH on methimazole .    For questions or updates, please contact  [facility] Please consult [email or web address] for contact info under       Signed, Jerel Balding, MD

## 2024-01-05 NOTE — Plan of Care (Signed)
  Problem: Activity: Goal: Risk for activity intolerance will decrease Outcome: Progressing   Problem: Coping: Goal: Level of anxiety will decrease Outcome: Progressing   Problem: Elimination: Goal: Will not experience complications related to bowel motility Outcome: Progressing Goal: Will not experience complications related to urinary retention Outcome: Progressing   

## 2024-01-06 ENCOUNTER — Other Ambulatory Visit: Payer: Self-pay

## 2024-01-06 DIAGNOSIS — I509 Heart failure, unspecified: Secondary | ICD-10-CM

## 2024-01-06 DIAGNOSIS — R1310 Dysphagia, unspecified: Secondary | ICD-10-CM | POA: Diagnosis not present

## 2024-01-06 DIAGNOSIS — J690 Pneumonitis due to inhalation of food and vomit: Secondary | ICD-10-CM | POA: Diagnosis not present

## 2024-01-06 DIAGNOSIS — R6521 Severe sepsis with septic shock: Secondary | ICD-10-CM | POA: Diagnosis not present

## 2024-01-06 LAB — RENAL FUNCTION PANEL
Albumin: 2.7 g/dL — ABNORMAL LOW (ref 3.5–5.0)
Anion gap: 15 (ref 5–15)
BUN: 24 mg/dL — ABNORMAL HIGH (ref 8–23)
CO2: 26 mmol/L (ref 22–32)
Calcium: 8.9 mg/dL (ref 8.9–10.3)
Chloride: 89 mmol/L — ABNORMAL LOW (ref 98–111)
Creatinine, Ser: 1.08 mg/dL — ABNORMAL HIGH (ref 0.44–1.00)
GFR, Estimated: 54 mL/min — ABNORMAL LOW (ref >60)
Glucose, Bld: 103 mg/dL — ABNORMAL HIGH (ref 70–99)
Phosphorus: 3.9 mg/dL (ref 2.5–4.6)
Potassium: 4.2 mmol/L (ref 3.5–5.1)
Sodium: 130 mmol/L — ABNORMAL LOW (ref 135–145)

## 2024-01-06 LAB — CBC WITH DIFFERENTIAL/PLATELET
Abs Immature Granulocytes: 0.04 10*3/uL (ref 0.00–0.07)
Basophils Absolute: 0.1 10*3/uL (ref 0.0–0.1)
Basophils Relative: 1 %
Eosinophils Absolute: 0.1 10*3/uL (ref 0.0–0.5)
Eosinophils Relative: 2 %
HCT: 40.3 % (ref 36.0–46.0)
Hemoglobin: 12.7 g/dL (ref 12.0–15.0)
Immature Granulocytes: 1 %
Lymphocytes Relative: 20 %
Lymphs Abs: 1.4 10*3/uL (ref 0.7–4.0)
MCH: 28.9 pg (ref 26.0–34.0)
MCHC: 31.5 g/dL (ref 30.0–36.0)
MCV: 91.6 fL (ref 80.0–100.0)
Monocytes Absolute: 0.8 10*3/uL (ref 0.1–1.0)
Monocytes Relative: 11 %
Neutro Abs: 4.7 10*3/uL (ref 1.7–7.7)
Neutrophils Relative %: 65 %
Platelets: 186 10*3/uL (ref 150–400)
RBC: 4.4 MIL/uL (ref 3.87–5.11)
RDW: 18.5 % — ABNORMAL HIGH (ref 11.5–15.5)
WBC: 7.1 10*3/uL (ref 4.0–10.5)
nRBC: 0 % (ref 0.0–0.2)

## 2024-01-06 LAB — MAGNESIUM: Magnesium: 2.1 mg/dL (ref 1.7–2.4)

## 2024-01-06 MED ORDER — FUROSEMIDE 20 MG PO TABS
20.0000 mg | ORAL_TABLET | Freq: Every day | ORAL | Status: DC
  Administered 2024-01-06 – 2024-01-08 (×3): 20 mg via ORAL
  Filled 2024-01-06 (×3): qty 1

## 2024-01-06 MED ORDER — LOSARTAN POTASSIUM 50 MG PO TABS
25.0000 mg | ORAL_TABLET | Freq: Every day | ORAL | Status: DC
  Administered 2024-01-06 – 2024-01-08 (×3): 25 mg via ORAL
  Filled 2024-01-06 (×3): qty 1

## 2024-01-06 MED ORDER — OXYCODONE-ACETAMINOPHEN 5-325 MG PO TABS
1.0000 | ORAL_TABLET | ORAL | Status: DC | PRN
  Administered 2024-01-07 – 2024-01-08 (×5): 1 via ORAL
  Filled 2024-01-06 (×5): qty 1

## 2024-01-06 MED ORDER — CHLORHEXIDINE GLUCONATE CLOTH 2 % EX PADS
6.0000 | MEDICATED_PAD | Freq: Every day | CUTANEOUS | Status: DC
  Administered 2024-01-06 – 2024-01-07 (×2): 6 via TOPICAL

## 2024-01-06 NOTE — Progress Notes (Signed)
  Progress Note  Patient Name: Rhonda Marshall Date of Encounter: 01/06/2024 [facility] Cardiologist: Lynwood Schilling, MD   Interval Summary   Smiling today. Borderline adequate AF rate control. No problems with hypotension. Good UO. Down 1.5 lb since yesterday. Mild hyponatremia.  Vital Signs Vitals:   01/06/24 0300 01/06/24 0400 01/06/24 0507 01/06/24 0600  BP: (!) 124/94 (!) 132/95 (!) 137/98 (!) 135/95  Pulse: (!) 103 (!) 124 94 99  Resp: 10 (!) 24 19 (!) 8  Temp: 99.7 F (37.6 C) 99.5 F (37.5 C) 99.3 F (37.4 C) 99.5 F (37.5 C)  TempSrc:      SpO2: 99% 99% 100% 99%  Weight:      Height:        Intake/Output Summary (Last 24 hours) at 01/06/2024 0825 Last data filed at 01/06/2024 0600 Gross per 24 hour  Intake 1293.5 ml  Output 1950 ml  Net -656.5 ml      01/04/2024    5:00 AM 01/03/2024    9:55 AM 12/31/2023    4:26 AM  Last 3 Weights  Weight (lbs) 111 lb 5.3 oz 112 lb 10.5 oz 109 lb  Weight (kg) 50.5 kg 51.1 kg 49.442 kg      Telemetry/ECG  AFib, ventricular rate mostly 90s-low 100s - Personally Reviewed  Physical Exam  GEN: No acute distress.   Neck: No JVD Cardiac: irregular rhythm, holosystolic murmurs at apex and LLSB, no diastolic murmur, no rubs, or gallops.  Respiratory: Clear to auscultation bilaterally. GI: Soft, nontender, non-distended  MS: No edema  Assessment & Plan  Add ARB for HFrEF. Reduce furosemide  dose to avoid worsening hyponatremia. Enforce tighter fluid restriction. Watch Na levels.   For questions or updates, please contact [facility] Please consult [email or web address] for contact info under       Signed, Jerel Balding, MD

## 2024-01-06 NOTE — Progress Notes (Addendum)
 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 To discharging provider:  please sign these orders via discharge navigator,  Select New Orders & click on the button choice - Manage This Unsigned Work.     Thank you for allowing pharmacy to be a part of this patient's care.  Calton Nash, HILDEGARD PharmD Candidate 01/06/2024, 10:32 AM

## 2024-01-06 NOTE — Progress Notes (Signed)
 Physical Therapy Treatment Patient Details Name: Rhonda Marshall MRN: [id number] DOB: February 15, 1951 Today's Date: 01/06/2024   History of Present Illness Patient is a 73 year old female who presented on 12/31/23 with volume overload due to acute on chronic CHF, severe sepsis due to multifocal pneumonia. patient was also found to have occult radial head fracture on L side. patient declined to have another xray cvompleted on R side but patietn reporting R side is the one fractured.PMH: prediabetes, permanent a fib, hypokalemia., HTN,RA, hypothyroidism    PT Comments  The patient ambulated x 80' using RW and 2 L, SPo2 >92% then after seated ? dropped to 88% vs not a good pleth, encouraged patient to work on breathing and not talk quite as much as she  was noted 3/4. SPO2  Returned to 95%  The patient hopeful to DC home. With HHPT.     If plan is discharge home, recommend the following: A little help with walking and/or transfers;Assistance with cooking/housework;A little help with bathing/dressing/bathroom;Assist for transportation;Help with stairs or ramp for entrance   Can travel by private vehicle        Equipment Recommendations  None recommended by PT    Recommendations for Other Services       Precautions / Restrictions Precautions Precautions: Fall Precaution/Restrictions Comments: L side occult radial head fracutre. Restrictions Weight Bearing Restrictions Per Provider Order: No     Mobility  Bed Mobility   Bed Mobility: Supine to Sit     Supine to sit: Min assist     General bed mobility comments: light support to sit upright from left side    Transfers Overall transfer level: Needs assistance Equipment used: Rolling walker (2 wheels) Transfers: Sit to/from Stand Sit to Stand: Contact guard assist           General transfer comment: no extra support    Ambulation/Gait Ambulation/Gait assistance: Contact guard assist, +2 safety/equipment Gait Distance  (Feet): 80 Feet Assistive device: Rolling walker (2 wheels) Gait Pattern/deviations: Step-through pattern Gait velocity: decr     General Gait Details: gait steady with RW   Stairs             Wheelchair Mobility     Tilt Bed    Modified Rankin (Stroke Patients Only)       Balance Overall balance assessment: Mild deficits observed, not formally tested                                          Communication Communication Communication: Impaired Factors Affecting Communication: Hearing impaired  Cognition Arousal: Alert Behavior During Therapy: WFL for tasks assessed/performed   PT - Cognitive impairments: No apparent impairments                         Following commands: Intact      Cueing    Exercises      General Comments        Pertinent Vitals/Pain Pain Assessment Faces Pain Scale: No hurt    Home Living                          Prior Function            PT Goals (current goals can now be found in the care plan section) Progress towards PT goals: Progressing toward goals  Frequency    Min 3X/week      PT Plan      Co-evaluation              AM-PAC PT 6 Clicks Mobility   Outcome Measure  Help needed turning from your back to your side while in a flat bed without using bedrails?: A Little Help needed moving from lying on your back to sitting on the side of a flat bed without using bedrails?: A Little Help needed moving to and from a bed to a chair (including a wheelchair)?: A Little   Help needed to walk in hospital room?: A Little Help needed climbing 3-5 steps with a railing? : A Lot 6 Click Score: 14    End of Session Equipment Utilized During Treatment: Gait belt;Oxygen Activity Tolerance: Patient tolerated treatment well Patient left: in chair;with call bell/phone within reach;with chair alarm set Nurse Communication: Mobility status PT Visit Diagnosis: Unsteadiness on  feet (R26.81);Muscle weakness (generalized) (M62.81);Difficulty in walking, not elsewhere classified (R26.2)     Time: 8859-8785 PT Time Calculation (min) (ACUTE ONLY): 34 min  Charges:    $Gait Training: 23-37 mins PT General Charges $$ ACUTE PT VISIT: 1 Visit                    Darice Potters PT Acute Rehabilitation Services Office [phone]   Potters Darice Norris 01/06/2024, 12:55 PM

## 2024-01-06 NOTE — TOC Progression Note (Signed)
 Transition of Care [facility]) - Progression Note    Patient Details  Name: Rhonda Marshall MRN: [id number] Date of Birth: [birth year]  Transition of [facility]) CM/SW Contact  Jon ONEIDA Anon, RN Phone Number: 01/06/2024, 12:43 PM  Clinical Narrative:    NCM spoke with Cindie with [facility] to set up [facility] RN services at discharge. Pt daughter Avelina is in agreement with discharge plan. NCM spoke with Pam with [employer] to make her aware of the [facility] agency choice. TOC will continue to follow.   Expected Discharge Plan: Home w Home Health Services Barriers to Discharge: Continued Medical Work up  Expected Discharge Plan and Services In-house Referral: NA Discharge Planning Services: CM Consult Post Acute Care Choice: NA Living arrangements for the past 2 months: Single Family Home                 DME Arranged: N/A DME Agency: NA       HH Arranged: RN HH AgencyHotel manager Home Health Care Date HH Agency Contacted: 01/06/24 Time HH Agency Contacted: 1243 Representative spoke with at [facility] Agency: Cindie Sillmon with [facility]   Social Determinants of Health (SDOH) Interventions SDOH Screenings   Food Insecurity: No Food Insecurity (12/31/2023)  Housing: Low Risk  (12/31/2023)  Transportation Needs: No Transportation Needs (12/31/2023)  Utilities: Not At Risk (12/31/2023)  Alcohol Screen: Low Risk  (08/18/2023)  Financial Resource Strain: Low Risk  (08/18/2023)  Social Connections: Moderately Integrated (12/31/2023)  Tobacco Use: Medium Risk (12/31/2023)    Readmission Risk Interventions    01/01/2024    1:00 PM 04/02/2023    3:52 PM 03/30/2023    1:51 PM  Readmission Risk Prevention Plan  Transportation Screening Complete Complete Complete  PCP or Specialist Appt within 5-7 Days  Complete Complete  PCP or Specialist Appt within 3-5 Days Complete    Home Care Screening  Complete Complete  Medication Review (RN CM)  Complete Complete  HRI or Home Care Consult Complete    Social Work  Consult for Recovery Care Planning/Counseling Complete    Palliative Care Screening Not Applicable    Medication Review Oceanographer) Complete

## 2024-01-06 NOTE — Progress Notes (Signed)
 Progress Note   Patient: Rhonda Marshall FMW:[id number] DOB: [birth year] DOA: 12/31/2023     6 DOS: the patient was seen and examined on 01/06/2024   Brief hospital course: 73 year old female history of chronic A-fib on Eliquis , hypertension, rheumatoid arthritis, hypothyroidism, HFrEF who admitted to the hospital with volume overload secondary to acute on chronic CHF exacerbation, severe sepsis due to multifocal pneumonia and concern for aspiration pneumonia. Patient placed on IV antibiotics. Patient received a dose of IV Lasix  however due to hypotension/soft blood pressure diuretics held. Cardiology consulted. GI also consulted due to concerns for debris in the esophagus. ID consulted patient maintained on IV antibiotics likely will require 6 weeks of IV antibiotics to cover for empiric endocarditis as well.   Assessment and Plan: #1 severe sepsis likely secondary to multifocal pneumonia/aspiration pneumonia and Enterococcus [name] bacteremia with septic shock/hypotension - Patient on admission met criteria for sepsis with hypothermia, tachycardia, initial lactate at 3.2 trending down with source of sepsis being multifocal/aspiration pneumonia and bacteremia. - CT neck and CT chest done concerning for debris in the esophagus as well as diffuse bilateral bronchopneumonia with bilateral lower lobe consolidation, moderate right and small left layering pleural effusions with simple fluid density favoring transudate.  Body wall anasarca noted. -.  2/4 blood cultures with Enterococcus hirae. BCID negative. -MRSA PCR negative. - SLP following evaluated patient and patient on dysphagia 1 diet. - Continue empiric IV Zosyn . - Patient with persistent hypotension early on in the hospitalization, patient in acute CHF exacerbation and as such difficulty diuresing and unable to give fluid boluses. -Repeat lactic acid level elevated but trending down, procalcitonin at 0.26 and 0.24. -Due to persistent hypotension  patient placed on Levophed  drip on 01/01/2024 which is subsequently being weaned off with improvement with blood pressure. -Repeat chest x-ray on 01/02/2024 with enlarged heart with vascular congestion and edema.  Right greater than left small pleural effusions.  -Right upper quadrant ultrasound with sick gallbladder with some wall thickening and wall edema but no stones.  Wall edema and thickening is nonspecific in the presence of ascites.  No biliary ductal dilatation.  Pleural effusion.  -Patient seen in consultation by ID who recommended TEE, however per Cardiology, pt is not ideal candidate for TEE, thus recommendation now for 6 weeks of IV antibiotics. - Was on zosyn , now on ampicillin and Rocephin  and recommending 6-week course of empiric IV antibiotics to cover for empiric endocarditis. -Repeat blood cultures with no growth to date. -PICC ordered 6/25 - Per ID will need colonoscopy in the outpatient setting as source of bacteremia remains unclear.. -PCCM was consulted, but have signed off as of 01/02/2024. -ID following and appreciate input and recommendations..   2.  Acute respiratory failure with hypoxia - Mild likely secondary to multifocal pneumonia as well as acute on chronic CHF exacerbation in the setting of associated pleural effusion and volume overload. - Continue treatment for pneumonia with IV antibiotics as well as heart failure. - Limited use of diuretics early on in the hospitalization due to soft/low blood pressure. -Status post Levophed  which was started on 01/01/2024 with improvement with blood pressure. -Levophed  has been weaned off. -diuresed with lasix  per Cardiology     3.  Lactic acidosis -Likely secondary to problem #1. - See problem #1.   4.  Acute on chronic HFrEF -2D echo done with a EF of 30 to 35%, global hypokinesis left ventricle, grade 2 DD, moderately dilated left atrial size, moderately dilated right atrial size, moderate MVR. -Patient  placed on pressors  on 01/01/2024 with improvement with blood pressure, pressors discontinued the morning of 01/02/2024. - Due to complicated cardiac history, soft/low blood pressure with difficulty diuresing in the setting of acute CHF exacerbation cardiology was consulted -Initially on IV lasix , now on oral lasix  per Cardiology   5.  Chronic hyponatremia - Patient noted to have previously been on salt tablets which was discontinued as patient was not taking diuretics of Lasix  on a daily basis. - Current course of hyponatremia likely secondary to hypervolemic hyponatremia. - Due to soft/low blood pressure unable to place on IV diuretics early on in the hospitalization.   - Hyponatremia improved since admission after receiving a dose of IV Lasix . -Per Cardiology, lasix  dose decreased given hyponatremia   6.  Debris in esophagus/dysphagia -Noted on CT soft tissue neck and CT chest. - Patient presented with severe sepsis with concerns for bilateral pneumonia/aspiration pneumonia. - Concern as to whether patient may actually be able to tolerate oral pills and whether they are being absorbed due to debris noted in the esophagus. - GI consulted Home at this time does not feel patient has esophageal food impaction but likely may have a esophageal dysmotility issue. - SLP following. - GI recommending trial of diet recommended by SLP and if patient tolerates diet over the weekend  recommend an outpatient endoscopy once patient has recovered from pneumonia. -Patient tolerating current dysphagia 1 diet and per GI advance as recommended by SLP. - Per GI if patient vomits or unable to tolerate diet then will need to consider inpatient endoscopy at this time. -Patient underwent modified barium swallow this morning and diet likely to be advanced to a dysphagia 2 versus dysphagia 3 diet. - GI was following.   7.  Chronic A-fib -Currently rate controlled on IV Lopressor . -Resume home regimen Toprol -XL 100 mg daily. -Discontinue  IV Lopressor . - Continue amiodarone . - Continue eliquis  - Cardiology following.    8.  Hyperthyroidism -Continue home regimen Tapazole . - Resume home regimen Toprol -XL 100 mg daily.   - DC IV Lopressor .  - Outpatient follow-up with primary endocrinologist.   9.  AKI on CKD stage IIIa - Baseline creatinine approximately 1.2. - Likely secondary to acute CHF exacerbation in the setting of low blood pressure. - Patient received a dose of IV Lasix  with some improvement in renal function. -Patient with urine output of 2.5 L over the past 24 hours. -Renal function slowly improving. - Avoid nephrotoxins.   10.  Bilateral pleural effusion -Likely secondary to volume overload. - Unable to diurese due to soft/low blood pressure early on in the hospitalization.   -BP improving. - Repeat chest x-ray done the morning of 01/02/2024, with enlarged heart with vascular congestion and edema.  Right greater than left small pleural effusions.  - Patient assessed by PCCM who currently recommending holding off on thoracentesis as bilateral pleural effusions and ascites likely due to decompensated CHF. -Patient with urine output of 2.5 L over the past 24 hours - Cardiology following.    11.  Hypotension/soft blood pressure -Patient with soft blood pressure/hypotension early on in the. - Likely secondary to problem #1. - Difficulty with diuresing early on in the hospitalization, due to soft blood pressure/hypotension. - Status post IV albumin  x 1.   - Cortisol level at 11.4.  - Lactic acid level was elevated at 2.8 and fluctuating.   - Procalcitonin at 0.26 and 0.24 and now 0.32.  - Patient was placed on Levophed  gtt on 01/01/2024, which has  subsequently been weaned off the morning of 01/02/2024.    12.  Hypokalemia/hypophosphatemia -Cont to correct as needed   13. Lethargy/Drowiness - Patient noted with an episode of lethargy and drowsiness the afternoon of 01/03/2024. - CT head done negative. - ABG,  CBC, c-Met, ammonia levels with no significant abnormalities noted. - Patient improved clinically and back to baseline. - No further workup needed.   14.pressure injury, POA Pressure Injury 08/16/23 Toe (Comment  which one) Anterior;Left Unstageable - Full thickness tissue loss in which the base of the injury is covered by slough (yellow, tan, gray, green or brown) and/or eschar (tan, brown or black) in the wound bed. (Active)  08/16/23 9141  Location: Toe (Comment  which one)  Location Orientation: Anterior;Left (Second)  Staging: Unstageable - Full thickness tissue loss in which the base of the injury is covered by slough (yellow, tan, gray, green or brown) and/or eschar (tan, brown or black) in the wound bed.  Wound Description (Comments):   DO NOT USE:  Present on Admission: Yes (Patient states those are from my shoes I think)       Subjective: Asking about resuming chronic pain meds  Physical Exam: Vitals:   01/06/24 1000 01/06/24 1003 01/06/24 1127 01/06/24 1400  BP:  113/78  (!) 117/90  Pulse: 96 94  79  Resp: (!) 21 15  19   Temp:   98 F (36.7 C)   TempSrc:   Axillary   SpO2: 100% 100%  98%  Weight:      Height:       General exam: Awake, laying in bed, in nad Respiratory system: Normal respiratory effort, no wheezing Cardiovascular system: regular rate, s1, s2 Gastrointestinal system: Soft, nondistended, positive BS Central nervous system: CN2-12 grossly intact, strength intact Extremities: Perfused, no clubbing Skin: Normal skin turgor, no notable skin lesions seen Psychiatry: Mood normal // no visual hallucinations   Data Reviewed:  Labs reviewed: Na 130, K 4.2, Cr 1.08, WBC 7.1, Hb 12.7, Plts 186  Family Communication: Pt in room, family at bedside  Disposition: Status is: Inpatient Remains inpatient appropriate because: severity of illness  Planned Discharge Destination: Home    Author: Garnette Pelt, MD 01/06/2024 2:42 PM  For on call review  [email or web address].

## 2024-01-06 NOTE — Progress Notes (Signed)
 PICC order noted, pt will be assessed on 6/26 for insertion.

## 2024-01-06 NOTE — Progress Notes (Addendum)
 [facility]  Date of Admission:  12/31/2023   Total days of inpatient antibiotics 6  Principal Problem:   Severe sepsis with septic shock (CODE) (HCC) Active Problems:   Hyperthyroidism   Hypotension   AKI (acute kidney injury) (HCC)   Hyponatremia   Acute on chronic systolic CHF (congestive heart failure) (HCC)   Acute respiratory failure with hypoxia (HCC)   CKD (chronic kidney disease) stage 3, GFR 30-59 ml/min (HCC)   Sepsis due to pneumonia (HCC)   Dysphagia   Aspiration pneumonia of both lungs (HCC)   Bilateral pleural effusion   Lactic acidosis   Permanent atrial fibrillation (HCC)   Bacteremia   Lethargy          Assessment: 73 year old female with A-fib on Eliquis , hypertension, rheumatoid arthritis, recent ED visitfollowing a fall discharged on 6/16 presented again with worsening shortness of breath found to have #Enterococcus hirae bacteremia  with PNA secondary to unclear source, possible gi translocation vs pulmonary #AMS-resolved, patient states she was sleeping -Blood cul CT chest showed diffuse bilateral bronchopneumonia, bilateral lower lobe consolidation.   fluid or secretions in the proximal thoracic esophagus.  BCx from admission 2/4 bottles Enterococcus - Reviewed UA from 6/20 negative nitrites negative leukocytes. Ucx NG Interval: less responsive this afternoon,primary contacted. CT head negative. Today 6/23 mentation is improved. She is able to answer questions. #Esophageal contents - CT noted retained fluid or secretion in thoracic esophagus.  GI engaged and recommended SLP.  Outpatient endoscopy will recover from pneumonia. - Patient fevered overnight suspect post secondary to aspiration.  Had a speech eval done on 6/24 which showed, dysphagia 3. Recommendations:  -Continue amp + ctx tomorrow. 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 Pt states she had SHOB all of a sudden, prior  to this she had diarrhea. She may have had bacterial translocation leading to bacteremia (diarrhea/esophageal content)-> endocarditis with emboli to lungs vs initial PNA leading to bacteremia. Enterococcus is commonly associated GI organism, although Endocarditis is generally subacute, not as acute as in the case.  -place picc -F/U with  ID outpatient - TTE no veg, moderate AV valve thickening.  TEE contraindicated given throat/neck radiation per cards.  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    Labs weekly while on IV antibiotics: _x_ CBC with differential __ BMP **TWICE WEEKLY ON VANCOMYCIN   _x_ CMP x__ CRP __ ESR _x_ Vancomycin  trough TWICE WEEKLY __ CK  __ Please pull PIC at completion of IV antibiotics x__ Please leave PIC in place until doctor has seen patient or been notified  Fax weekly labs to [phone]  Clinic Follow Up Appt: 7/28  @ [facility] with Dr. Dennise       Microbiology:   Antibiotics: Piptazo 6/18-6/23 Amp and ctx 6/23-  Cultures: Blood 6/18 2/4 bottles Enterococcus hirae  SUBJECTIVE: Resting in bed.  Interval: temp 100.8 overnight.   Review of Systems: Review of Systems  All other systems reviewed and are negative.    Scheduled Meds:  amiodarone   100 mg Oral Daily   apixaban   5 mg Oral BID   Chlorhexidine  Gluconate  Cloth  6 each Topical Q2200   feeding supplement  237 mL Oral BID BM   furosemide   20 mg Oral Daily   leptospermum manuka honey  1 Application Topical Daily   losartan  25 mg Oral Daily   methimazole   2.5 mg Oral Daily   metoprolol  succinate  100 mg Oral Daily   Continuous Infusions:  ampicillin (OMNIPEN) IV Stopped (01/06/24 1233)   cefTRIAXone  (ROCEPHIN )  IV Stopped (01/06/24 1157)   PRN Meds:.acetaminophen  **OR** acetaminophen , albuterol , ALPRAZolam , diclofenac  Sodium, ondansetron  **OR** ondansetron  (ZOFRAN ) IV, traMADol, traZODone  Allergies  Allergen Reactions   Clindamycin/Lincomycin Rash   Latex Rash   Plaquenil  [Hydroxychloroquine ] Other (See Comments)    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  Physical Exam Constitutional:      Comments: Less alert  HENT:     Head: Normocephalic and atraumatic.     Right Ear: Tympanic membrane normal.     Left Ear: Tympanic membrane normal.     Nose: Nose normal.     Mouth/Throat:     Mouth: Mucous membranes are moist.   Eyes:     Extraocular Movements: Extraocular movements intact.     Conjunctiva/sclera: Conjunctivae normal.     Pupils: Pupils are equal, round, and reactive to light.    Cardiovascular:     Rate and Rhythm: Normal rate and regular rhythm.     Heart sounds: No murmur heard.    No friction rub. No gallop.  Pulmonary:     Effort: Pulmonary effort is normal.     Breath sounds: Normal breath sounds.  Abdominal:     General: Abdomen is flat.     Palpations: Abdomen is soft.   Skin:    General: Skin is warm and dry.   Psychiatric:        Mood and Affect: Mood normal.       Lab Results Lab Results  Component Value Date   WBC 7.1  01/06/2024   HGB 12.7 01/06/2024   HCT 40.3 01/06/2024   MCV 91.6 01/06/2024   PLT 186 01/06/2024    Lab Results  Component Value Date   CREATININE 1.08 (H) 01/06/2024   BUN 24 (H) 01/06/2024   NA 130 (L) 01/06/2024   K 4.2 01/06/2024   CL 89 (L) 01/06/2024   CO2 26 01/06/2024    Lab Results  Component Value Date   ALT 23 01/03/2024   AST 29 01/03/2024   ALKPHOS 141 (H) 01/03/2024   BILITOT 1.6 (H) 01/03/2024        Loney Stank, MD [facility] 01/06/2024, 12:44 PM Evaluation of this patient requires complex antimicrobial therapy evaluation and counseling + isolation needs for disease  transmission risk assessment and mitigation

## 2024-01-06 NOTE — Progress Notes (Signed)
 Speech Language Pathology Treatment: Dysphagia  Patient Details Name: Rhonda Marshall MRN: [id number] DOB: 28-Nov-1950 Today's Date: 01/06/2024 Time: 8374-8350 SLP Time Calculation (min) (ACUTE ONLY): 24 min  Assessment / Plan / Recommendation Clinical Impression  SLP follow up for dysphagia management given concern for potential esophageal dysmotility and her lack of dentition.  SLP implored pt and her son Rhonda Marshall* present to try again with adhesive or wax for her teeth to maximize capable use.  From discussion with pt, she only uses them for church or going out - but not for eating.  Her daughter chops her foods at home - but pt admits her intake is compromised due to her diet restriction.    Son, Rhonda Marshall, stays with her during the day and in discussion with him and the pt - they advised they would order EVERY meal to make sure pt does not receive food items she can not manage.  Today she seems to have improved cognition and able to articulate clinical reasoning to follow every bite of food with a tiny sip of liquid *to help facilitate esophageal clearance.  She also demonstrated this during intake without cues - consumed packet of graham crackers and approx 1.5 ounces of water .  Son ordered her 3 meals for the next date during the session.    No s/s of aspiration present and pt informed to follow up with GI as an OP for suspected esophageal dysmotility. She states I've been through a lot and I just want to go home.    Provided written dysmotility compensation and provided in writing using teach back.  No SlP follow up needed.   HPI HPI: Patient is a 73 year old female history of chronic A-fib on Eliquis , hypertension, rheumatoid arthritis, hypothyroidism, HFrEF who admitted to the hospital with volume overload secondary to acute on chronic CHF exacerbation, severe sepsis due to multifocal pneumonia and concern for aspiration pneumonia.  Patient placed on IV antibiotics.  Patient received a dose of  IV Lasix  however due to hypotension/soft blood pressure diuretics held.  Cardiology consulted.  GI also consulted due to concerns for debris in the esophagus.  ID consulted patient maintained on IV antibiotics likely will require 6 weeks of IV antibiotics to cover for empiric endocarditis as well.     She underwent MBS and follow up indicated.      SLP Plan             Recommendations  Diet recommendations: Regular;Thin liquid Liquids provided via: Cup;Straw Medication Administration: Whole meds with puree Supervision: Patient able to self feed Compensations: Slow rate;Small sips/bites Postural Changes and/or Swallow Maneuvers: Seated upright 90 degrees;Upright 30-60 min after meal                      None Dysphagia, unspecified (R13.10)          Rhonda POUR, MS [facility] SLP Acute Rehab Services Office [phone]  Rhonda Marshall  01/06/2024, 4:54 PM

## 2024-01-06 NOTE — Hospital Course (Signed)
 73 year old female history of chronic A-fib on Eliquis , hypertension, rheumatoid arthritis, hypothyroidism, HFrEF who admitted to the hospital with volume overload secondary to acute on chronic CHF exacerbation, severe sepsis due to multifocal pneumonia and concern for aspiration pneumonia. Patient placed on IV antibiotics. Patient received a dose of IV Lasix  however due to hypotension/soft blood pressure diuretics held. Cardiology consulted. GI also consulted due to concerns for debris in the esophagus. ID consulted patient maintained on IV antibiotics likely will require 6 weeks of IV antibiotics to cover for empiric endocarditis as well.

## 2024-01-07 ENCOUNTER — Inpatient Hospital Stay (HOSPITAL_COMMUNITY)

## 2024-01-07 DIAGNOSIS — R6521 Severe sepsis with septic shock: Secondary | ICD-10-CM | POA: Diagnosis not present

## 2024-01-07 DIAGNOSIS — I5020 Unspecified systolic (congestive) heart failure: Secondary | ICD-10-CM | POA: Diagnosis not present

## 2024-01-07 DIAGNOSIS — I509 Heart failure, unspecified: Secondary | ICD-10-CM | POA: Diagnosis not present

## 2024-01-07 DIAGNOSIS — J690 Pneumonitis due to inhalation of food and vomit: Secondary | ICD-10-CM | POA: Diagnosis not present

## 2024-01-07 LAB — CBC
HCT: 40.3 % (ref 36.0–46.0)
Hemoglobin: 12.2 g/dL (ref 12.0–15.0)
MCH: 28.5 pg (ref 26.0–34.0)
MCHC: 30.3 g/dL (ref 30.0–36.0)
MCV: 94.2 fL (ref 80.0–100.0)
Platelets: 176 10*3/uL (ref 150–400)
RBC: 4.28 MIL/uL (ref 3.87–5.11)
RDW: 18.6 % — ABNORMAL HIGH (ref 11.5–15.5)
WBC: 6.3 10*3/uL (ref 4.0–10.5)
nRBC: 0 % (ref 0.0–0.2)

## 2024-01-07 LAB — COMPREHENSIVE METABOLIC PANEL WITH GFR
ALT: 23 U/L (ref 0–44)
AST: 28 U/L (ref 15–41)
Albumin: 2.4 g/dL — ABNORMAL LOW (ref 3.5–5.0)
Alkaline Phosphatase: 163 U/L — ABNORMAL HIGH (ref 38–126)
Anion gap: 13 (ref 5–15)
BUN: 26 mg/dL — ABNORMAL HIGH (ref 8–23)
CO2: 28 mmol/L (ref 22–32)
Calcium: 9.1 mg/dL (ref 8.9–10.3)
Chloride: 91 mmol/L — ABNORMAL LOW (ref 98–111)
Creatinine, Ser: 1.13 mg/dL — ABNORMAL HIGH (ref 0.44–1.00)
GFR, Estimated: 51 mL/min — ABNORMAL LOW (ref >60)
Glucose, Bld: 96 mg/dL (ref 70–99)
Potassium: 3.3 mmol/L — ABNORMAL LOW (ref 3.5–5.1)
Sodium: 132 mmol/L — ABNORMAL LOW (ref 135–145)
Total Bilirubin: 1.1 mg/dL (ref 0.0–1.2)
Total Protein: 6.5 g/dL (ref 6.5–8.1)

## 2024-01-07 LAB — CULTURE, BLOOD (ROUTINE X 2)

## 2024-01-07 MED ORDER — SODIUM CHLORIDE 0.9% FLUSH: 10.0000 mL | INTRAVENOUS | Status: DC | PRN

## 2024-01-07 MED ORDER — POTASSIUM CHLORIDE CRYS ER 20 MEQ PO TBCR
60.0000 meq | EXTENDED_RELEASE_TABLET | Freq: Once | ORAL | Status: AC
  Administered 2024-01-07: 60 meq via ORAL
  Filled 2024-01-07: qty 3

## 2024-01-07 MED ORDER — SODIUM CHLORIDE 0.9% FLUSH
10.0000 mL | Freq: Two times a day (BID) | INTRAVENOUS | Status: DC
  Administered 2024-01-07 – 2024-01-08 (×3): 10 mL

## 2024-01-07 NOTE — TOC Progression Note (Signed)
 Transition of Care [facility]) - Progression Note    Patient Details  Name: Rhonda Marshall MRN: [id number] Date of Birth: 1951-06-02  [facility] CM/SW Contact  Jon ONEIDA Anon, RN Phone Number: 01/07/2024, 1:56 PM  Clinical Narrative:    NCM spoke with Pam with [facility] and she confirms pt daughter Avelina will be here at the hospital on 6/27 between 1-3pm to do PICC training. Pt should be ok to DC after training complete. TOC is following.   Expected Discharge Plan: Home w Home Health Services Barriers to Discharge: Continued Medical Work up  Expected Discharge Plan and Services In-house Referral: NA Discharge Planning Services: CM Consult Post Acute Care Choice: NA Living arrangements for the past 2 months: Single Family Home                 DME Arranged: N/A DME Agency: NA       HH Arranged: RN HH AgencyHotel manager Home Health Care Date HH Agency Contacted: 01/06/24 Time HH Agency Contacted: 1243 Representative spoke with at [facility] Agency: Cindie Sillmon with [facility]   Social Determinants of Health (SDOH) Interventions SDOH Screenings   Food Insecurity: No Food Insecurity (12/31/2023)  Housing: Low Risk  (12/31/2023)  Transportation Needs: No Transportation Needs (12/31/2023)  Utilities: Not At Risk (12/31/2023)  Alcohol Screen: Low Risk  (08/18/2023)  Financial Resource Strain: Low Risk  (08/18/2023)  Social Connections: Moderately Integrated (12/31/2023)  Tobacco Use: Medium Risk (12/31/2023)    Readmission Risk Interventions    01/01/2024    1:00 PM 04/02/2023    3:52 PM 03/30/2023    1:51 PM  Readmission Risk Prevention Plan  Transportation Screening Complete Complete Complete  PCP or Specialist Appt within 5-7 Days  Complete Complete  PCP or Specialist Appt within 3-5 Days Complete    Home Care Screening  Complete Complete  Medication Review (RN CM)  Complete Complete  HRI or Home Care Consult Complete    Social Work Consult for Recovery Care  Planning/Counseling Complete    Palliative Care Screening Not Applicable    Medication Review Oceanographer) Complete

## 2024-01-07 NOTE — Progress Notes (Addendum)
  Progress Note  Patient Name: Rhonda Marshall Date of Encounter: 01/07/2024 [facility] Cardiologist: Lynwood Schilling, MD   Interval Summary   No dyspnea. BP tolerating the addition of losartan so far. Appears to be maintaining euvolemia. Na improved. ID plans 6 wks IV ABx so PICC line has been placed.  Vital Signs Vitals:   01/07/24 0416 01/07/24 0600 01/07/24 0700 01/07/24 0800  BP:  (!) 134/100 118/76   Pulse:  93 91   Resp:  16 (!) 22   Temp:    97.8 F (36.6 C)  TempSrc:    Oral  SpO2:  100% 100%   Weight: 53.2 kg     Height:        Intake/Output Summary (Last 24 hours) at 01/07/2024 1054 Last data filed at 01/07/2024 1023 Gross per 24 hour  Intake 697.45 ml  Output 400 ml  Net 297.45 ml      01/07/2024    4:16 AM 01/06/2024    7:04 AM 01/04/2024    5:00 AM  Last 3 Weights  Weight (lbs) 117 lb 4.6 oz 111 lb 5.3 oz 111 lb 5.3 oz  Weight (kg) 53.2 kg 50.5 kg 50.5 kg      Telemetry/ECG  AFib, adequate rate control. - Personally Reviewed  Physical Exam  GEN: No acute distress.   Neck: 5-6 cm JVD Cardiac: irregular, unchanged holosystolic murmurs, no diastolic murmurs, rubs, or gallops.  Respiratory: Clear to auscultation bilaterally. GI: Soft, nontender, non-distended  MS: No edema  Assessment & Plan   [facility] will sign off.   The patient is ready for discharge today from a cardiac standpoint. Medication Recommendations:   Losartan 25 mg daily (new) Furosemide  20 mg daily (scheduled, not prn) Metoprolol  succinate 100 mg daily KCl 20 mEq twice times daily Other recommendations (labs, testing, etc):  BMET in one week Follow up as an outpatient:  Has 8/18 appt with Dr. Schilling For questions or updates, please contact  [facility] Please consult [email or web address] for contact info under       Signed, Jerel Balding, MD

## 2024-01-07 NOTE — Progress Notes (Signed)
 Physical Therapy Treatment Patient Details Name: Rhonda Marshall MRN: [id number] DOB: 27-Nov-1950 Today's Date: 01/07/2024   History of Present Illness Patient is a 73 year old female who presented on 12/31/23 with volume overload due to acute on chronic CHF, severe sepsis due to multifocal pneumonia. patient was also found to have occult radial head fracture on L side. patient declined to have another xray cvompleted on R side but patietn reporting R side is the one fractured.PMH: prediabetes, permanent a fib, hypokalemia., HTN,RA, hypothyroidism    PT Comments  Pt is progressing well with mobility. She ambulated 100' with RW, no loss of balance, SpO2 90% on 2L O2, HR 120.     If plan is discharge home, recommend the following: A little help with walking and/or transfers;Assistance with cooking/housework;A little help with bathing/dressing/bathroom;Assist for transportation;Help with stairs or ramp for entrance   Can travel by private vehicle        Equipment Recommendations  None recommended by PT    Recommendations for Other Services       Precautions / Restrictions Precautions Precautions: Fall Precaution/Restrictions Comments: L side occult radial head fracture Restrictions Weight Bearing Restrictions Per Provider Order: No     Mobility  Bed Mobility Overal bed mobility: Needs Assistance Bed Mobility: Supine to Sit     Supine to sit: Min assist     General bed mobility comments: min A to raise trunk    Transfers Overall transfer level: Needs assistance Equipment used: Rolling walker (2 wheels) Transfers: Sit to/from Stand Sit to Stand: Min assist           General transfer comment: VCs hand placement, min A to power up    Ambulation/Gait Ambulation/Gait assistance: Contact guard assist Gait Distance (Feet): 100 Feet Assistive device: Rolling walker (2 wheels) Gait Pattern/deviations: Step-through pattern, Decreased stride length Gait velocity: decr      General Gait Details: gait steady with RW, no loss of balance; SpO2 90% on 2L O2 walking, HR 120   Stairs             Wheelchair Mobility     Tilt Bed    Modified Rankin (Stroke Patients Only)       Balance Overall balance assessment: Mild deficits observed, not formally tested                                          Communication Communication Communication: Impaired Factors Affecting Communication: Hearing impaired  Cognition Arousal: Alert Behavior During Therapy: WFL for tasks assessed/performed   PT - Cognitive impairments: No apparent impairments, Attention                       PT - Cognition Comments: easily distracted, VCs to focus on task Following commands: Intact      Cueing Cueing Techniques: Verbal cues, Gestural cues  Exercises      General Comments        Pertinent Vitals/Pain Pain Assessment Pain Assessment: No/denies pain    Home Living                          Prior Function            PT Goals (current goals can now be found in the care plan section) Acute Rehab PT Goals Patient Stated Goal: go home PT Goal Formulation:  With patient Time For Goal Achievement: 01/18/24 Potential to Achieve Goals: Good Progress towards PT goals: Progressing toward goals    Frequency    Min 3X/week      PT Plan      Co-evaluation              AM-PAC PT 6 Clicks Mobility   Outcome Measure  Help needed turning from your back to your side while in a flat bed without using bedrails?: A Little Help needed moving from lying on your back to sitting on the side of a flat bed without using bedrails?: A Little Help needed moving to and from a bed to a chair (including a wheelchair)?: A Little Help needed standing up from a chair using your arms (e.g., wheelchair or bedside chair)?: A Little Help needed to walk in hospital room?: A Little Help needed climbing 3-5 steps with a railing? : A  Little 6 Click Score: 18    End of Session Equipment Utilized During Treatment: Gait belt;Oxygen Activity Tolerance: Patient tolerated treatment well Patient left: in chair;with call bell/phone within reach;with chair alarm set;with nursing/sitter in room Nurse Communication: Mobility status PT Visit Diagnosis: Unsteadiness on feet (R26.81);Muscle weakness (generalized) (M62.81);Difficulty in walking, not elsewhere classified (R26.2)     Time: 8566-8493 PT Time Calculation (min) (ACUTE ONLY): 33 min  Charges:    $Gait Training: 8-22 mins $Therapeutic Activity: 8-22 mins PT General Charges $$ ACUTE PT VISIT: 1 Visit                     Sylvan Delon Copp PT 01/07/2024  Acute Rehabilitation Services  Office [phone]

## 2024-01-07 NOTE — Progress Notes (Signed)
 Peripherally Inserted Central Catheter Placement  The IV Nurse has discussed with the patient and/or persons authorized to consent for the patient, the purpose of this procedure and the potential benefits and risks involved with this procedure.  The benefits include less needle sticks, lab draws from the catheter, and the patient may be discharged home with the catheter. Risks include, but not limited to, infection, bleeding, blood clot (thrombus formation), and puncture of an artery; nerve damage and irregular heartbeat and possibility to perform a PICC exchange if needed/ordered by physician.  Alternatives to this procedure were also discussed.  Bard Power PICC patient education guide, fact sheet on infection prevention and patient information card has been provided to patient /or left at bedside.    PICC Placement Documentation  PICC Double Lumen 01/07/24 Right Basilic 34 cm 0 cm (Active)  Indication for Insertion or Continuance of Line Home intravenous therapies (PICC only) 01/07/24 0900  Exposed Catheter (cm) 0 cm 01/07/24 0900  Site Assessment Clean, Dry, Intact 01/07/24 0900  Lumen #1 Status Saline locked;Blood return noted 01/07/24 0900  Lumen #2 Status Saline locked;Blood return noted 01/07/24 0900  Dressing Type Transparent;Securing device 01/07/24 0900  Dressing Status Antimicrobial disc/dressing in place;Clean, Dry, Intact 01/07/24 0900  Line Care Connections checked and tightened 01/07/24 0900  Line Adjustment (NICU/IV Team Only) No 01/07/24 0900  Dressing Intervention New dressing 01/07/24 0900  Dressing Change Due 01/14/24 01/07/24 0900       Rhonda Marshall 01/07/2024, 9:13 AM

## 2024-01-07 NOTE — Progress Notes (Signed)
 Progress Note   Patient: Rhonda Marshall FMW:[id number] DOB: [birth year] DOA: 12/31/2023     7 DOS: the patient was seen and examined on 01/07/2024   Brief hospital course: 73 year old female history of chronic A-fib on Eliquis , hypertension, rheumatoid arthritis, hypothyroidism, HFrEF who admitted to the hospital with volume overload secondary to acute on chronic CHF exacerbation, severe sepsis due to multifocal pneumonia and concern for aspiration pneumonia. Patient placed on IV antibiotics. Patient received a dose of IV Lasix  however due to hypotension/soft blood pressure diuretics held. Cardiology consulted. GI also consulted due to concerns for debris in the esophagus. ID consulted patient maintained on IV antibiotics likely will require 6 weeks of IV antibiotics to cover for empiric endocarditis as well.   Assessment and Plan: #1 severe sepsis likely secondary to multifocal pneumonia/aspiration pneumonia and Enterococcus [name] bacteremia with septic shock/hypotension - Patient on admission met criteria for sepsis with hypothermia, tachycardia, initial lactate at 3.2 trending down with source of sepsis being multifocal/aspiration pneumonia and bacteremia. - CT neck and CT chest done concerning for debris in the esophagus as well as diffuse bilateral bronchopneumonia with bilateral lower lobe consolidation, moderate right and small left layering pleural effusions with simple fluid density favoring transudate.  Body wall anasarca noted. -.  2/4 blood cultures with Enterococcus hirae. BCID negative. -MRSA PCR negative. - SLP following evaluated patient and patient on dysphagia 1 diet. - Continue empiric IV Zosyn . - Patient with persistent hypotension early on in the hospitalization, patient in acute CHF exacerbation and as such difficulty diuresing and unable to give fluid boluses. -Repeat lactic acid level elevated but trending down, procalcitonin at 0.26 and 0.24. -Due to persistent hypotension  patient placed on Levophed  drip on 01/01/2024 which is subsequently being weaned off with improvement with blood pressure. -Repeat chest x-ray on 01/02/2024 with enlarged heart with vascular congestion and edema.  Right greater than left small pleural effusions.  -Right upper quadrant ultrasound with sick gallbladder with some wall thickening and wall edema but no stones.  Wall edema and thickening is nonspecific in the presence of ascites.  No biliary ductal dilatation.  Pleural effusion.  -Patient seen in consultation by ID who recommended TEE, however per Cardiology, pt is not ideal candidate for TEE, thus recommendation now for 6 weeks of IV antibiotics. - Was on zosyn , now on ampicillin and Rocephin  and recommending 6-week course of empiric IV antibiotics to cover for empiric endocarditis. -Repeat blood cultures with no growth to date. -PICC ordered 6/25 - Per ID will need colonoscopy in the outpatient setting as source of bacteremia remains unclear.. -PCCM was consulted, but have signed off as of 01/02/2024. -ID had been following. Pt to complete ampicillin with rocephin  on 02/13/24   2.  Acute respiratory failure with hypoxia - Mild likely secondary to multifocal pneumonia as well as acute on chronic CHF exacerbation in the setting of associated pleural effusion and volume overload. - Continue treatment for pneumonia with IV antibiotics as well as heart failure. - Limited use of diuretics early on in the hospitalization due to soft/low blood pressure. -Status post Levophed  which was started on 01/01/2024 with improvement with blood pressure. -Levophed  has been weaned off. -diuresed with lasix  per Cardiology     3.  Lactic acidosis -Likely secondary to problem #1. - See problem #1.   4.  Acute on chronic HFrEF -2D echo done with a EF of 30 to 35%, global hypokinesis left ventricle, grade 2 DD, moderately dilated left atrial size, moderately dilated right  atrial size, moderate MVR. -Patient  placed on pressors on 01/01/2024 with improvement with blood pressure, pressors discontinued the morning of 01/02/2024. - Due to complicated cardiac history, soft/low blood pressure with difficulty diuresing in the setting of acute CHF exacerbation cardiology was consulted -Initially on IV lasix , now on oral lasix  per Cardiology   5.  Chronic hyponatremia - Patient noted to have previously been on salt tablets which was discontinued as patient was not taking diuretics of Lasix  on a daily basis. - Current course of hyponatremia likely secondary to hypervolemic hyponatremia. - Due to soft/low blood pressure unable to place on IV diuretics early on in the hospitalization.   - Hyponatremia improved since admission after receiving a dose of IV Lasix . -Per Cardiology, lasix  dose decreased given hyponatremia   6.  Debris in esophagus/dysphagia -Noted on CT soft tissue neck and CT chest. - Patient presented with severe sepsis with concerns for bilateral pneumonia/aspiration pneumonia. - Concern as to whether patient may actually be able to tolerate oral pills and whether they are being absorbed due to debris noted in the esophagus. - GI consulted Home at this time does not feel patient has esophageal food impaction but likely may have a esophageal dysmotility issue. - SLP following. - GI recommending trial of diet recommended by SLP and if patient tolerates diet over the weekend  recommend an outpatient endoscopy once patient has recovered from pneumonia. -Patient tolerating current dysphagia 1 diet and per GI advance as recommended by SLP. - Per GI if patient vomits or unable to tolerate diet then will need to consider inpatient endoscopy at this time. -Patient underwent modified barium swallow this morning and diet likely to be advanced to a dysphagia 2 versus dysphagia 3 diet. - GI was following.   7.  Chronic A-fib -Currently rate controlled on IV Lopressor . -Resume home regimen Toprol -XL 100 mg  daily. -Discontinue IV Lopressor . - Continue amiodarone . - Continue eliquis  - Cardiology following.    8.  Hyperthyroidism -Continue home regimen Tapazole . - Resume home regimen Toprol -XL 100 mg daily.   - DC IV Lopressor .  - Outpatient follow-up with primary endocrinologist.   9.  AKI on CKD stage IIIa - Baseline creatinine approximately 1.2. - Likely secondary to acute CHF exacerbation in the setting of low blood pressure. - Patient received a dose of IV Lasix  with some improvement in renal function. -Patient with urine output of 2.5 L over the past 24 hours. -Renal function slowly improving. - Avoid nephrotoxins.   10.  Bilateral pleural effusion -Likely secondary to volume overload. - Unable to diurese due to soft/low blood pressure early on in the hospitalization.   -BP improving. - Repeat chest x-ray done the morning of 01/02/2024, with enlarged heart with vascular congestion and edema.  Right greater than left small pleural effusions.  - Patient assessed by PCCM who currently recommending holding off on thoracentesis as bilateral pleural effusions and ascites likely due to decompensated CHF. -Patient with urine output of 2.5 L over the past 24 hours - Cardiology following.    11.  Hypotension/soft blood pressure -Patient with soft blood pressure/hypotension early on in the. - Likely secondary to problem #1. - Difficulty with diuresing early on in the hospitalization, due to soft blood pressure/hypotension. - Status post IV albumin  x 1.   - Cortisol level at 11.4.  - Lactic acid level was elevated at 2.8 and fluctuating.   - Procalcitonin at 0.26 and 0.24 and now 0.32.  - Patient was placed on Levophed   gtt on 01/01/2024, which has subsequently been weaned off the morning of 01/02/2024.    12.  Hypokalemia/hypophosphatemia -Cont to correct as needed   13. Lethargy/Drowiness - Patient noted with an episode of lethargy and drowsiness the afternoon of 01/03/2024. - CT head  done negative. - ABG, CBC, c-Met, ammonia levels with no significant abnormalities noted. - Patient improved clinically and now back to baseline   14.pressure injury, POA Pressure Injury 08/16/23 Toe (Comment  which one) Anterior;Left Unstageable - Full thickness tissue loss in which the base of the injury is covered by slough (yellow, tan, gray, green or brown) and/or eschar (tan, brown or black) in the wound bed. (Active)  08/16/23 9141  Location: Toe (Comment  which one)  Location Orientation: Anterior;Left (Second)  Staging: Unstageable - Full thickness tissue loss in which the base of the injury is covered by slough (yellow, tan, gray, green or brown) and/or eschar (tan, brown or black) in the wound bed.  Wound Description (Comments):   DO NOT USE:  Present on Admission: Yes (Patient states those are from my shoes I think)       Subjective: Feeling better today. Pain better controlled with home regimen  Physical Exam: Vitals:   01/07/24 0700 01/07/24 0800 01/07/24 1100 01/07/24 1200  BP: 118/76  (!) 141/85 (!) 137/96  Pulse: 91  (!) 101 89  Resp: (!) 22  20 18   Temp:  97.8 F (36.6 C) 99.5 F (37.5 C) 99.7 F (37.6 C)  TempSrc:  Oral Bladder Bladder  SpO2: 100%  100% 100%  Weight:      Height:       General exam: Conversant, in no acute distress Respiratory system: normal chest rise, clear, no audible wheezing Cardiovascular system: regular rhythm, s1-s2 Gastrointestinal system: Nondistended, nontender, pos BS Central nervous system: No seizures, no tremors Extremities: No cyanosis, no joint deformities Skin: No rashes, no pallor Psychiatry: Affect normal // no auditory hallucinations   Data Reviewed:  Labs reviewed: Na 132, K 3.3, Cr 1.13, WBC 6.3, hgb 12.2, Plts 176  Family Communication: Pt in room, family not at bedside  Disposition: Status is: Inpatient Remains inpatient appropriate because: severity of illness  Planned Discharge Destination:  Home    Author: Garnette Pelt, MD 01/07/2024 3:33 PM  For on call review [email or web address].

## 2024-01-07 NOTE — Plan of Care (Signed)

## 2024-01-07 NOTE — Plan of Care (Signed)
 Patient able to tolerate taking her medications whole with applesauce with no signs of chocking, she is also tolerating thin liquids, plan of care and goals reviewed with patient, time given for questions, patient handbook/guide at bedside. Bed in the lowest locked position with call bell in hand and bed alarm on.  Problem: Education: Goal: Knowledge of General Education information will improve Description: Including pain rating scale, medication(s)/side effects and non-pharmacologic comfort measures Outcome: Progressing   Problem: Health Behavior/Discharge Planning: Goal: Ability to manage health-related needs will improve Outcome: Progressing   Problem: Clinical Measurements: Goal: Ability to maintain clinical measurements within normal limits will improve Outcome: Progressing Goal: Will remain free from infection Outcome: Progressing Goal: Diagnostic test results will improve Outcome: Progressing Goal: Respiratory complications will improve Outcome: Progressing Goal: Cardiovascular complication will be avoided Outcome: Progressing   Problem: Activity: Goal: Risk for activity intolerance will decrease Outcome: Progressing   Problem: Nutrition: Goal: Adequate nutrition will be maintained Outcome: Progressing   Problem: Coping: Goal: Level of anxiety will decrease Outcome: Progressing   Problem: Elimination: Goal: Will not experience complications related to bowel motility Outcome: Progressing Goal: Will not experience complications related to urinary retention Outcome: Progressing   Problem: Pain Managment: Goal: General experience of comfort will improve and/or be controlled Outcome: Progressing   Problem: Safety: Goal: Ability to remain free from injury will improve Outcome: Progressing   Problem: Skin Integrity: Goal: Risk for impaired skin integrity will decrease Outcome: Progressing

## 2024-01-08 ENCOUNTER — Other Ambulatory Visit (HOSPITAL_COMMUNITY): Payer: Self-pay

## 2024-01-08 ENCOUNTER — Other Ambulatory Visit (HOSPITAL_BASED_OUTPATIENT_CLINIC_OR_DEPARTMENT_OTHER): Payer: Self-pay

## 2024-01-08 DIAGNOSIS — R7881 Bacteremia: Secondary | ICD-10-CM | POA: Diagnosis not present

## 2024-01-08 DIAGNOSIS — I509 Heart failure, unspecified: Secondary | ICD-10-CM | POA: Diagnosis not present

## 2024-01-08 DIAGNOSIS — R6521 Severe sepsis with septic shock: Secondary | ICD-10-CM | POA: Diagnosis not present

## 2024-01-08 DIAGNOSIS — J690 Pneumonitis due to inhalation of food and vomit: Secondary | ICD-10-CM | POA: Diagnosis not present

## 2024-01-08 MED ORDER — CEFTRIAXONE IV (FOR PTA / DISCHARGE USE ONLY): 2.0000 g | Freq: Two times a day (BID) | INTRAVENOUS | 0 refills | Status: AC

## 2024-01-08 MED ORDER — LOSARTAN POTASSIUM 25 MG PO TABS
25.0000 mg | ORAL_TABLET | Freq: Every day | ORAL | 0 refills | Status: DC
Start: 2024-01-09 — End: 2024-02-10
  Filled 2024-01-08: qty 30, 30d supply, fill #0

## 2024-01-08 MED ORDER — AMPICILLIN IV (FOR PTA / DISCHARGE USE ONLY)
8.0000 g | INTRAVENOUS | 0 refills | Status: AC
Start: 2024-01-08 — End: 2024-02-15

## 2024-01-08 MED ORDER — FUROSEMIDE 20 MG PO TABS: 20.0000 mg | ORAL_TABLET | Freq: Every day | ORAL | Status: DC

## 2024-01-08 MED ORDER — POTASSIUM CHLORIDE ER 10 MEQ PO TBCR
20.0000 meq | EXTENDED_RELEASE_TABLET | Freq: Two times a day (BID) | ORAL | 0 refills | Status: DC
  Filled 2024-01-08: qty 120, 30d supply, fill #0

## 2024-01-08 NOTE — Progress Notes (Signed)
 TOC meds in  a  secure bag delivered to pt in room by this RN- daughter present  in process of home infusion teaching

## 2024-01-08 NOTE — Progress Notes (Signed)
 Heart Failure Navigator Progress Note  Assessed for Heart & Vascular TOC clinic readiness.  Patient does not meet criteria due to has a scheduled follow up on 02/08/2024 and a scheduled [facility] appointment on 02/29/2024. No HF TOC. .   Navigator will sign off at this time.   Stephane Haddock, BSN, Scientist, clinical (histocompatibility and immunogenetics) Only

## 2024-01-08 NOTE — Discharge Summary (Signed)
 Physician Discharge Summary   Patient: Rhonda Marshall MRN: [id number] DOB: 23-Nov-1950  Admit date:     12/31/2023  Discharge date: 01/08/24  Discharge Physician: Garnette Pelt   PCP: [name] LABOR, MD   Recommendations at discharge:    Follow up with PCP in 1-2 weeks Follow up with Cardiology as scheduled Routine PICC management and care. Please remove PICC after completing antibiotic after 02/13/24 dose Recommend outpatient endoscopy  Discharge Diagnoses: Principal Problem:   Severe sepsis with septic shock (CODE) (HCC) Active Problems:   Acute on chronic systolic CHF (congestive heart failure) (HCC)   Sepsis due to pneumonia (HCC)   Bacteremia   Hyperthyroidism   Hypotension   AKI (acute kidney injury) (HCC)   Hyponatremia   Acute respiratory failure with hypoxia (HCC)   CKD (chronic kidney disease) stage 3, GFR 30-59 ml/min (HCC)   Dysphagia   Aspiration pneumonia of both lungs (HCC)   Bilateral pleural effusion   Lactic acidosis   Permanent atrial fibrillation (HCC)   Lethargy  Resolved Problems:   * No resolved hospital problems. *  Hospital Course: 73 year old female history of chronic A-fib on Eliquis , hypertension, rheumatoid arthritis, hypothyroidism, HFrEF who admitted to the hospital with volume overload secondary to acute on chronic CHF exacerbation, severe sepsis due to multifocal pneumonia and concern for aspiration pneumonia. Patient placed on IV antibiotics. Patient received a dose of IV Lasix  however due to hypotension/soft blood pressure diuretics held. Cardiology consulted. GI also consulted due to concerns for debris in the esophagus. ID consulted patient maintained on IV antibiotics likely will require 6 weeks of IV antibiotics to cover for empiric endocarditis as well.   Assessment and Plan: #1 severe sepsis likely secondary to multifocal pneumonia/aspiration pneumonia and Enterococcus [name] bacteremia with septic shock/hypotension - Patient on  admission met criteria for sepsis with hypothermia, tachycardia, initial lactate at 3.2 trending down with source of sepsis being multifocal/aspiration pneumonia and bacteremia. - CT neck and CT chest done concerning for debris in the esophagus as well as diffuse bilateral bronchopneumonia with bilateral lower lobe consolidation, moderate right and small left layering pleural effusions with simple fluid density favoring transudate.  Body wall anasarca noted. -.  2/4 blood cultures with Enterococcus hirae. BCID negative. -MRSA PCR negative. - SLP following evaluated patient and patient on dysphagia 1 diet. - Patient with persistent hypotension early on in the hospitalization, patient in acute CHF exacerbation and as such difficulty diuresing and unable to give fluid boluses. -Due to persistent hypotension patient placed on Levophed  drip on 01/01/2024 which is subsequently being weaned off with improvement with blood pressure. -Repeat chest x-ray on 01/02/2024 with enlarged heart with vascular congestion and edema.  Right greater than left small pleural effusions.  -Right upper quadrant ultrasound with sick gallbladder with some wall thickening and wall edema but no stones.  Wall edema and thickening is nonspecific in the presence of ascites.  No biliary ductal dilatation.  Pleural effusion.  -Patient seen in consultation by ID who recommended TEE, however per Cardiology, pt is not ideal candidate for TEE, thus recommendation now for 6 weeks of IV antibiotics. - Was on zosyn , now on ampicillin  and Rocephin  and recommending 6-week course of empiric IV antibiotics to cover for empiric endocarditis. -Repeat blood cultures with no growth to date. -PICC ordered 6/25 - Per ID will need colonoscopy in the outpatient setting as source of bacteremia remains unclear.. -PCCM was consulted, but have signed off as of 01/02/2024. -ID had been following. Pt to  complete ampicillin  with rocephin  on 02/13/24   2.  Acute  respiratory failure with hypoxia - Mild likely secondary to multifocal pneumonia as well as acute on chronic CHF exacerbation in the setting of associated pleural effusion and volume overload. - Continue treatment for pneumonia with IV antibiotics as well as heart failure. - Limited use of diuretics early on in the hospitalization due to soft/low blood pressure. -Status post Levophed  which was started on 01/01/2024 with improvement with blood pressure. -Levophed  has been weaned off. -diuresed with lasix  per Cardiology     3.  Lactic acidosis -Likely secondary to problem #1. - See problem #1.   4.  Acute on chronic HFrEF -2D echo done with a EF of 30 to 35%, global hypokinesis left ventricle, grade 2 DD, moderately dilated left atrial size, moderately dilated right atrial size, moderate MVR. -Patient placed on pressors on 01/01/2024 with improvement with blood pressure, pressors discontinued the morning of 01/02/2024. - Due to complicated cardiac history, soft/low blood pressure with difficulty diuresing in the setting of acute CHF exacerbation cardiology was consulted -Initially on IV lasix , later on oral lasix  per Cardiology   5.  Chronic hyponatremia - Patient noted to have previously been on salt tablets which was discontinued as patient was not taking diuretics of Lasix  on a daily basis. - Current course of hyponatremia likely secondary to hypervolemic hyponatremia. - Due to soft/low blood pressure unable to place on IV diuretics early on in the hospitalization.   - Hyponatremia improved since admission after receiving a dose of IV Lasix . -Per Cardiology, lasix  dose decreased given hyponatremia   6.  Debris in esophagus/dysphagia -Noted on CT soft tissue neck and CT chest. - Patient presented with severe sepsis with concerns for bilateral pneumonia/aspiration pneumonia. - Concern as to whether patient may actually be able to tolerate oral pills and whether they are being absorbed due to  debris noted in the esophagus. - GI consulted Home at this time does not feel patient has esophageal food impaction but likely may have a esophageal dysmotility issue. - SLP following. - GI recommending trial of diet recommended by SLP and if patient tolerates diet over the weekend  recommend an outpatient endoscopy once patient has recovered from pneumonia. -Patient underwent modified barium swallow this morning and diet likely to be advanced to a dysphagia 2 versus dysphagia 3 diet. - GI was following.   7.  Chronic A-fib -Currently rate controlled on IV Lopressor . -Resume home regimen Toprol -XL 100 mg daily. -Discontinue IV Lopressor . - Continue amiodarone . - Continue eliquis  - Cardiology following.    8.  Hyperthyroidism -Continue home regimen Tapazole . - Resume home regimen Toprol -XL 100 mg daily.   - DC IV Lopressor .  - Outpatient follow-up with primary endocrinologist.   9.  AKI on CKD stage IIIa - Baseline creatinine approximately 1.2. - Likely secondary to acute CHF exacerbation in the setting of low blood pressure. - Patient received a dose of IV Lasix  with some improvement in renal function. -Patient with urine output of 2.5 L over the past 24 hours. -Renal function slowly improving. - Avoid nephrotoxins.   10.  Bilateral pleural effusion -Likely secondary to volume overload. - Unable to diurese due to soft/low blood pressure early on in the hospitalization.   -BP improving. - Repeat chest x-ray done the morning of 01/02/2024, with enlarged heart with vascular congestion and edema.  Right greater than left small pleural effusions.  - Patient assessed by PCCM who currently recommending holding off on thoracentesis  as bilateral pleural effusions and ascites likely due to decompensated CHF. -Patient with urine output of 2.5 L over the past 24 hours - Cardiology following.    11.  Hypotension/soft blood pressure -Patient with soft blood pressure/hypotension early on in  the. - Likely secondary to problem #1. - Difficulty with diuresing early on in the hospitalization, due to soft blood pressure/hypotension. - Status post IV albumin  x 1.   - Cortisol level at 11.4.  - Lactic acid level was elevated at 2.8 and fluctuating.   - Procalcitonin at 0.26 and 0.24 and now 0.32.  - Patient was placed on Levophed  gtt on 01/01/2024, which has subsequently been weaned off the morning of 01/02/2024.    12.  Hypokalemia/hypophosphatemia -Cont to correct as needed   13. Lethargy/Drowiness - Patient noted with an episode of lethargy and drowsiness the afternoon of 01/03/2024. - CT head done negative. - ABG, CBC, c-Met, ammonia levels with no significant abnormalities noted. - Patient improved clinically and now back to baseline   14.pressure injury, POA Pressure Injury 08/16/23 Toe (Comment  which one) Anterior;Left Unstageable - Full thickness tissue loss in which the base of the injury is covered by slough (yellow, tan, gray, green or brown) and/or eschar (tan, brown or black) in the wound bed. (Active)  08/16/23 9141  Location: Toe (Comment  which one)  Location Orientation: Anterior;Left (Second)  Staging: Unstageable - Full thickness tissue loss in which the base of the injury is covered by slough (yellow, tan, gray, green or brown) and/or eschar (tan, brown or black) in the wound bed.  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   ALPRAZolam  1 MG tablet Commonly known as: XANAX  Take 1 mg by mouth 4 (four) times daily as needed for anxiety.   amiodarone  200 MG tablet Commonly known as: PACERONE  Take 0.5 tablets (100 mg total) by mouth daily.   ampicillin  IVPB Inject 8 g into the vein daily. As a continuous infusion. 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   cefTRIAXone  IVPB Commonly known as: ROCEPHIN  Inject 2 g into the vein every 12 (twelve) hours. 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   diclofenac  Sodium 1 % Gel Commonly known as: VOLTAREN  Apply 2 g topically 4 (four) times daily as needed (pain).   docusate sodium  100 MG capsule Commonly known as: COLACE Take 1 capsule (100 mg total) by mouth 2 (two) times daily as needed for mild constipation.   Eliquis  5 MG Tabs tablet Generic drug: apixaban  Take 1 tablet (5 mg total) by mouth 2 (two) times daily.   furosemide  20 MG tablet Commonly known as: LASIX  Take 1 tablet (20 mg total) by mouth daily. What changed:  when to take this reasons to take  this   loperamide  2 MG tablet Commonly known as: IMODIUM  A-D Take 2 mg by mouth 4 (four) times daily as needed for diarrhea or loose stools.   losartan  25 MG tablet Commonly known as: COZAAR  Take 1 tablet (25 mg total) by mouth daily. Start taking on: January 09, 2024   methimazole  5 MG tablet Commonly known as: TAPAZOLE  Take 0.5 tablets (2.5 mg total) by mouth daily.   metoprolol  succinate 100 MG 24 hr tablet Commonly known as: TOPROL -XL Take 100 mg by mouth daily. Take with or immediately following a meal.   multivitamin with minerals Tabs tablet Take 1 tablet by mouth daily.   oxyCODONE -acetaminophen  10-325 MG tablet Commonly known as: PERCOCET Take 1 tablet by mouth every 4 (four) hours as needed for pain.   potassium chloride  10 MEQ tablet Commonly known as: KLOR-CON  Take 2 tablets (20 mEq total) by mouth 2 (two) times daily.   sodium chloride  1 g tablet Take 1 tablet (1 g total) by mouth as needed. What changed: reasons to take this   Vitamin D  (Ergocalciferol ) 1.25 MG (50000 UNIT) Caps capsule Commonly known as: DRISDOL Take 50,000 Units by mouth once a week.               Discharge Care Instructions  (From admission, onward)           Start     Ordered   01/08/24 0000  Change dressing on IV access line weekly and PRN  (Home infusion instructions - [facility] )        01/08/24 1109            Follow-up Information     [facility] Follow up.   Specialty: Home Health Services Why: Once you are home from the hospital someone from [facility] will contact you to start your Home Health nursing services. Contact information: [address] [place] [address] [phone]         [name] LABOR, MD Follow up in 2 week(s).   Specialty: Family Medicine Why: Hospital follow up Contact information: [address] [place] [address] [phone]         Lavona Agent,  MD Follow up on 02/29/2024.   Specialty: Cardiology Why: Hospital follow up, as scheduled Contact information: [address] [place] [address] [phone]                Discharge Exam: Fredricka Weights   01/06/24 0704 01/07/24 0416 01/08/24 0500  Weight: 50.5 kg 53.2 kg 53.2 kg   General exam: Awake, laying in bed, in nad Respiratory system: Normal respiratory effort, no wheezing Cardiovascular system: regular rate, s1, s2 Gastrointestinal system: Soft, nondistended, positive BS Central nervous system: CN2-12 grossly intact, strength  intact Extremities: Perfused, no clubbing Skin: Normal skin turgor, no notable skin lesions seen Psychiatry: Mood normal // no visual hallucinations   Condition at discharge: fair  The results of significant diagnostics from this hospitalization (including imaging, microbiology, ancillary and laboratory) are listed below for reference.   Imaging Studies: DG Elbow 2 Views Right Result Date: 01/07/2024 CLINICAL DATA:  Right elbow pain after fall. EXAM: RIGHT ELBOW - 2 VIEW COMPARISON:  None Available. FINDINGS: There is no evidence of fracture, dislocation, or joint effusion. There is no evidence of arthropathy or other focal bone abnormality. Soft tissues are unremarkable. IMPRESSION: Negative. Electronically Signed   By: Lynwood Landy Raddle M.D.   On: 01/07/2024 09:55   DG CHEST PORT 1 VIEW Result Date: 01/07/2024 CLINICAL DATA:  Status post PICC placement. EXAM: PORTABLE CHEST 1 VIEW COMPARISON:  January 02, 2024. FINDINGS: Stable cardiomegaly. Right middle lobe opacity is noted concerning for atelectasis or pneumonia with small associated pleural effusion. Mild left basilar atelectasis is noted with small left pleural effusion. Right-sided PICC line is noted with distal tip in expected position of SVC. Bony thorax is unremarkable. IMPRESSION: Right-sided PICC line tip is noted in expected position of the SVC. Right middle lobe atelectasis or  pneumonia is noted with small associated pleural effusion. Mild left basilar atelectasis is noted with small left pleural effusion. Electronically Signed   By: Lynwood Landy Raddle M.D.   On: 01/07/2024 09:53   US  EKG SITE RITE Result Date: 01/06/2024 If Site Rite image not attached, placement could not be confirmed due to current cardiac rhythm.  DG Swallowing Func-Speech Pathology Result Date: 01/05/2024 Table formatting from the original result was not included. Modified Barium Swallow Study Patient Details Name: Rhonda Marshall MRN: [id number] Date of Birth: 1951/03/09 Today's Date: 01/05/2024 HPI/PMH: HPI: Pt is a 73 yo female with recent fall on 6/16, now presenting 6/19 with dysphagia and dyspnea. Admitted with volume overload due to chronic heart failure as well as severe sepsis due to multifocal PNA. Imaging also revealed debris within the esophagus. Pt has been evaluated by SLP in the past (clinical evaluations in 2021, 2023, 2024) with some concern for ability to masticate but without overt concern for pharyngeal dysphagia or aspiration. 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  Oropharyngeal swallow is largely functional - aside from mild difficulties to masticate solids due edentulous status.   Pharyngeal motlity including epiglottic deflection and airway closure is adequate fortunately with only trace retention. Barium tablet given with thin easily transited through oropharynx.  MBS does not diagnose below PES however after puree/solid pt appeared with retention - which appeared effectively cleared with thin barium.  Recommend consider advancing diet as pt desires to maximize her intake using compensation strategies for dysphagia mitigation.  SlP will follow up briefly for compensation  strategy review with pt for likely esophageal dysmotility per GI. Factors that may increase risk of adverse event in presence of aspiration Noe & Lianne 2021): Factors that may increase risk of adverse event in presence of aspiration Noe & Lianne 2021): Limited mobility; Frail or deconditioned; Inadequate oral hygiene (? appearance of coating on tongue ? oral candidiasis) Recommendations/Plan: Swallowing Evaluation Recommendations Swallowing Evaluation Recommendations Recommendations: PO diet PO Diet Recommendation: Dysphagia 3 (Mechanical soft); Dysphagia 2 (Finely chopped); Thin liquids (Level 0) Liquid Administration via: Straw; Cup Medication Administration:  Whole meds with liquid (whole if small) Supervision: Patient able to self-feed Swallowing strategies  : Slow rate; Small bites/sips; Follow solids with liquids (start intake with liquids, follow all bites of food swallowed with sips of liquids) Postural changes: Position pt fully upright for meals; Stay upright 30-60 min after meals Oral care recommendations: Oral care BID (2x/day) Treatment Plan Treatment Plan Treatment recommendations: Therapy as outlined in treatment plan below Follow-up recommendations: No SLP follow up Functional status assessment: Patient has had a recent decline in their functional status and demonstrates the ability to make significant improvements in function in a reasonable and predictable amount of time. Treatment frequency: Min 1x/week Treatment duration: 1 week Interventions: Aspiration precaution training; Compensatory techniques; Diet toleration management by SLP Recommendations Recommendations for follow up therapy are one component of a multi-disciplinary discharge planning process, led by the attending physician.  Recommendations may be updated based on patient status, additional functional criteria and insurance authorization. Assessment: Orofacial Exam: Orofacial Exam Oral Cavity: Oral Hygiene: Other (comment)  (slight white coating on tongue) Oral Cavity - Dentition: Edentulous (has dentures that she does not use for eating) Oral Motor/Sensory Function: Other (comment) (noted left facial asymmetry - which pt attributes to swelling on the right from her fall) Anatomy: Anatomy: WFL Boluses Administered: Boluses Administered Boluses Administered: Thin liquids (Level 0); Mildly thick liquids (Level 2, nectar thick); Puree; Solid  Oral Impairment Domain: Oral Impairment Domain Lip Closure: No labial escape Tongue control during bolus hold: Posterior escape of less than half of bolus Bolus preparation/mastication: Disorganized chewing/mashing with solid pieces of bolus unchewed Bolus transport/lingual motion: Brisk tongue motion Oral residue: Trace residue lining oral structures Location of oral residue : Tongue Initiation of pharyngeal swallow : Pyriform sinuses; Valleculae  Pharyngeal Impairment Domain: Pharyngeal Impairment Domain Soft palate elevation: No bolus between soft palate (SP)/pharyngeal wall (PW) Anterior hyoid excursion: Complete anterior movement Epiglottic movement: Complete inversion Laryngeal vestibule closure: Complete, no air/contrast in laryngeal vestibule Pharyngeal stripping wave : Present - complete Pharyngeal contraction (A/P view only): N/A Pharyngoesophageal segment opening: Complete distension and complete duration, no obstruction of flow Tongue base retraction: No contrast between tongue base and posterior pharyngeal wall (PPW) Pharyngeal residue: Complete pharyngeal clearance Location of pharyngeal residue: Pyriform sinuses  Esophageal Impairment Domain: Esophageal Impairment Domain Esophageal clearance upright position: Esophageal retention (barium tablet taken with thin appeared retained in esophagus, water  appeared to transit it distally; but without full clearance; pt did NOT sense) Pill: Pill Consistency administered: Thin liquids (Level 0) Thin liquids (Level 0): [facility] Penetration/Aspiration  Scale Score: Penetration/Aspiration Scale Score 1.  Material does not enter airway: Puree; Solid; Pill; Mildly thick liquids (Level 2, nectar thick) 2.  Material enters airway, remains ABOVE vocal cords then ejected out: Thin liquids (Level 0) Compensatory Strategies: Compensatory Strategies Compensatory strategies: Yes Chin tuck: -- (tested, continued with trace penetration) Other(comment): Effective (liquid wash to help to appear to transit puree/solid)   General Information: Caregiver present: No  Diet Prior to this Study: Dysphagia 1 (pureed); Thin liquids (Level 0)   Temperature : Normal   Respiratory Status: WFL   Supplemental O2: Nasal cannula   History of Recent Intubation: No  Behavior/Cognition: Alert; Cooperative; Pleasant mood Self-Feeding Abilities: Able to self-feed (has arthritis mostly impacting her right hand) No data recorded Volitional Cough: Able to elicit Volitional Swallow: Able to elicit No data recorded Goal Planning: Prognosis for improved oropharyngeal function: Good No data recorded No data recorded No data recorded No data recorded Pain: Pain Assessment  Pain Assessment: No/denies pain Faces Pain Scale: 0 End of Session: Start Time:SLP Start Time (ACUTE ONLY): 0816 Stop Time: SLP Stop Time (ACUTE ONLY): 0840 Time Calculation:SLP Time Calculation (min) (ACUTE ONLY): 24 min Charges: SLP Evaluations $ SLP Speech Visit: 1 Visit SLP Evaluations $MBS Swallow: 1 Procedure $Swallowing Treatment: 1 Procedure SLP visit diagnosis: SLP Visit Diagnosis: Dysphagia, unspecified (R13.10) Past Medical History: Past Medical History: Diagnosis Date  Anxiety   Atrial fibrillation (HCC)   newly diagnosed 10/2010  Family history of adverse reaction to anesthesia   Sister's BP drops   Hip fracture (HCC) 01/18/2022  Hypertension   Rheumatoid arthritis(714.0)   Subclinical hyperthyroidism  Past Surgical History: Past Surgical History: Procedure Laterality Date  Arm surgery    fracture, right arm  CARDIOVERSION N/A  08/10/2014  Procedure: CARDIOVERSION;  Surgeon: Leim VEAR Moose, MD;  Location: [facility] ENDOSCOPY;  Service: Cardiovascular;  Laterality: N/A;  CARDIOVERSION N/A 01/22/2022  Procedure: CARDIOVERSION;  Surgeon: Hobart Powell BRAVO, MD;  Location: [facility] ENDOSCOPY;  Service: Cardiovascular;  Laterality: N/A;  CATARACT EXTRACTION    DIRECT LARYNGOSCOPY N/A 09/02/2019  Procedure: DIRECT LARYNGOSCOPY WITH BIOPSY;  Surgeon: Ethyl Lonni BRAVO, MD;  Location: [facility];  Service: ENT;  Laterality: N/A;  HIP ARTHROPLASTY Right 01/19/2022  Procedure: ARTHROPLASTY BIPOLAR HIP (HEMIARTHROPLASTY);  Surgeon: Josefina Chew, MD;  Location: [facility] ORS;  Service: Orthopedics;  Laterality: Right;  IR GASTROSTOMY TUBE MOD SED  10/17/2019  IR GASTROSTOMY TUBE REMOVAL  01/03/2020  TEE WITHOUT CARDIOVERSION N/A 01/22/2022  Procedure: TRANSESOPHAGEAL ECHOCARDIOGRAM (TEE);  Surgeon: Hobart Powell BRAVO, MD;  Location: [facility] ENDOSCOPY;  Service: Cardiovascular;  Laterality: N/A;  TUBAL LIGATION   [name], MS [facility] SLP Acute Rehab Services Office [phone] [name] 01/05/2024, 9:17 AM  CT HEAD WO CONTRAST ( ) Result Date: 01/03/2024 CLINICAL DATA:  Altered mental status EXAM: CT HEAD WITHOUT CONTRAST TECHNIQUE: Contiguous axial images were obtained from the base of the skull through the vertex without intravenous contrast. RADIATION DOSE REDUCTION: This exam was performed according to the departmental dose-optimization program which includes automated exposure control, adjustment of the mA and/or kV according to patient size and/or use of iterative reconstruction technique. COMPARISON:  None Available. FINDINGS: Brain: There is no mass, hemorrhage or extra-axial collection. The size and configuration of the ventricles and extra-axial CSF spaces are normal. There is hypoattenuation of the white matter, most commonly indicating chronic small vessel disease. Vascular: No hyperdense vessel or unexpected vascular calcification.  Skull: The visualized skull base, calvarium and extracranial soft tissues are normal. Sinuses/Orbits: No fluid levels or advanced mucosal thickening of the visualized paranasal sinuses. No mastoid or middle ear effusion. Normal orbits. Other: None. IMPRESSION: 1. No acute intracranial abnormality. 2. Chronic small vessel disease. Electronically Signed   By: Franky Stanford M.D.   On: 01/03/2024 20:08   DG Chest 1 View Result Date: 01/02/2024 CLINICAL DATA:  Severe sepsis EXAM: CHEST  1 VIEW COMPARISON:  X-ray and CT scan 12/31/2023. FINDINGS: Pleural effusions are again seen. Enlarged cardiopericardial silhouette with calcified aorta. Vascular congestion interstitial edema identified. No consolidation pneumothorax. Kyphotic x-ray obscures the apices more left than right as there is some leftward rotation of the x-ray. Overlapping cardiac leads. IMPRESSION: Enlarged heart with vascular congestion and edema. Right greater than left small pleural effusions Electronically Signed   By: Ranell Bring M.D.   On: 01/02/2024 10:04   US  Abdomen Limited RUQ (LIVER/GB) Result Date: 01/02/2024 CLINICAL DATA:  Severe sepsis EXAM: ULTRASOUND ABDOMEN LIMITED RIGHT UPPER  QUADRANT COMPARISON:  Ultrasound 03/10/2023 limited.  CT 12/31/2023. FINDINGS: Gallbladder: Distended gallbladder. There is some wall thickening and wall edema. No shadowing stones or reported Murphy's sign. Common bile duct: Diameter: 3 mm Liver: No focal lesion identified. Within normal limits in parenchymal echogenicity. Portal vein is patent on color Doppler imaging with normal direction of blood flow towards the liver. Other: Scattered ascites.  Pleural effusion. IMPRESSION: Sick gallbladder with some wall thickening and wall edema but no stones. The wall edema and thickening is nonspecific in the presence of ascites. No biliary ductal dilatation. Pleural effusion. Electronically Signed   By: Ranell Bring M.D.   On: 01/02/2024 10:00   ECHOCARDIOGRAM  COMPLETE Result Date: 01/01/2024    ECHOCARDIOGRAM REPORT   Patient Name:   Rhonda Marshall Date of Exam: 01/01/2024 Medical Rec #:  [id number]        Height:       62.0 in Accession #:    [id number]       Weight:       109.0 lb Date of Birth:  1951-06-01        BSA:          1.477 m Patient Age:    73 years         BP:           129/00 mmHg Patient Gender: F                HR:           85 bpm. Exam Location:  Inpatient Procedure: 2D Echo, Color Doppler and Cardiac Doppler (Both Spectral and Color            Flow Doppler were utilized during procedure). Indications:    I50.21 Acute systolic (congestive) heart failure  History:        Patient has prior history of Echocardiogram examinations, most                 recent 03/30/2023.  Sonographer:    Eva Lash Referring Phys: [phone] DANIEL V THOMPSON IMPRESSIONS  1. Left ventricular ejection fraction, by estimation, is 30 to 35%. The left ventricle has moderately decreased function. The left ventricle demonstrates global hypokinesis. The left ventricular internal cavity size was moderately dilated. Left ventricular diastolic parameters are consistent with Grade II diastolic dysfunction (pseudonormalization). Elevated left ventricular end-diastolic pressure.  2. Right ventricular systolic function is normal. The right ventricular size is normal.  3. Left atrial size was moderately dilated.  4. Right atrial size was moderately dilated.  5. The mitral valve is abnormal. Moderate mitral valve regurgitation. No evidence of mitral stenosis.  6. Tricuspid valve regurgitation is moderate.  7. The aortic valve is normal in structure. There is moderate calcification of the aortic valve. There is moderate thickening of the aortic valve. Aortic valve regurgitation is not visualized. Aortic valve sclerosis is present, with no evidence of aortic valve stenosis.  8. The inferior vena cava is dilated in size with >50% respiratory variability, suggesting right atrial pressure of 8 mmHg.  FINDINGS  Left Ventricle: Left ventricular ejection fraction, by estimation, is 30 to 35%. The left ventricle has moderately decreased function. The left ventricle demonstrates global hypokinesis. Definity  contrast agent was given IV to delineate the left ventricular endocardial borders. Strain was performed and the global longitudinal strain is indeterminate. The left ventricular internal cavity size was moderately dilated. There is no left ventricular hypertrophy. Left ventricular diastolic parameters are consistent with Grade II diastolic dysfunction (  pseudonormalization). Elevated left ventricular end-diastolic pressure. Right Ventricle: The right ventricular size is normal. No increase in right ventricular wall thickness. Right ventricular systolic function is normal. Left Atrium: Left atrial size was moderately dilated. Right Atrium: Right atrial size was moderately dilated. Pericardium: There is no evidence of pericardial effusion. Mitral Valve: The mitral valve is abnormal. There is mild thickening of the mitral valve leaflet(s). Moderate mitral valve regurgitation. No evidence of mitral valve stenosis. Tricuspid Valve: The tricuspid valve is normal in structure. Tricuspid valve regurgitation is moderate . No evidence of tricuspid stenosis. Aortic Valve: The aortic valve is normal in structure. There is moderate calcification of the aortic valve. There is moderate thickening of the aortic valve. Aortic valve regurgitation is not visualized. Aortic valve sclerosis is present, with no evidence of aortic valve stenosis. Aortic valve mean gradient measures 4.0 mmHg. Aortic valve peak gradient measures 7.2 mmHg. Aortic valve area, by VTI measures 1.81 cm. Pulmonic Valve: The pulmonic valve was normal in structure. Pulmonic valve regurgitation is mild. No evidence of pulmonic stenosis. Aorta: The aortic root is normal in size and structure. Venous: The inferior vena cava is dilated in size with greater than 50%  respiratory variability, suggesting right atrial pressure of 8 mmHg. IAS/Shunts: No atrial level shunt detected by color flow Doppler. Additional Comments: 3D was performed not requiring image post processing on an independent workstation and was indeterminate.  LEFT VENTRICLE PLAX 2D LVIDd:         4.50 cm      Diastology LVIDs:         3.90 cm      LV e' lateral:   6.64 cm/s LV PW:         1.10 cm      LV E/e' lateral: 18.7 LV IVS:        0.90 cm LVOT diam:     2.00 cm LV SV:         33 LV SV Index:   23 LVOT Area:     3.14 cm  LV Volumes (MOD) LV vol d, MOD A2C: 157.0 ml LV vol d, MOD A4C: 148.0 ml LV vol s, MOD A2C: 99.7 ml LV vol s, MOD A4C: 104.0 ml LV SV MOD A2C:     57.3 ml LV SV MOD A4C:     148.0 ml LV SV MOD BP:      50.6 ml RIGHT VENTRICLE TAPSE (M-mode): 1.0 cm LEFT ATRIUM             Index LA diam:        4.40 cm 2.98 cm/m LA Vol (A2C):   82.0 ml 55.52 ml/m LA Vol (A4C):   70.2 ml 47.53 ml/m LA Biplane Vol: 76.6 ml 51.86 ml/m  AORTIC VALVE AV Area (Vmax):    2.03 cm AV Area (Vmean):   1.74 cm AV Area (VTI):     1.81 cm AV Vmax:           134.00 cm/s AV Vmean:          89.800 cm/s AV VTI:            0.184 m AV Peak Grad:      7.2 mmHg AV Mean Grad:      4.0 mmHg LVOT Vmax:         86.60 cm/s LVOT Vmean:        49.800 cm/s LVOT VTI:          0.106 m LVOT/AV VTI  ratio: 0.58  AORTA Ao Asc diam: 3.50 cm MITRAL VALVE                TRICUSPID VALVE MV Area (PHT): 5.95 cm     TR Peak grad:   36.5 mmHg MR Peak grad: 92.9 mmHg     TR Vmax:        302.00 cm/s MR Mean grad: 56.0 mmHg MR Vmax:      482.00 cm/s   SHUNTS MR Vmean:     349.0 cm/s    Systemic VTI:  0.11 m MV E velocity: 124.00 cm/s  Systemic Diam: 2.00 cm MV A velocity: 39.20 cm/s MV E/A ratio:  3.16 Maude Emmer MD Electronically signed by Maude Emmer MD Signature Date/Time: 01/01/2024/2:35:36 PM    Final    CT CHEST ABDOMEN PELVIS WO CONTRAST Result Date: 12/31/2023 CLINICAL DATA:  73 year old female with sepsis. Abnormal lung apices on  neck CT 0234 hours today. EXAM: CT CHEST, ABDOMEN AND PELVIS WITHOUT CONTRAST TECHNIQUE: Multidetector CT imaging of the chest, abdomen and pelvis was performed following the standard protocol without IV contrast. RADIATION DOSE REDUCTION: This exam was performed according to the departmental dose-optimization program which includes automated exposure control, adjustment of the mA and/or kV according to patient size and/or use of iterative reconstruction technique. COMPARISON:  CT Chest, Abdomen, and Pelvis 03/10/2023. Neck CT earlier today. FINDINGS: CT CHEST FINDINGS Cardiovascular: Cardiomegaly appears progressed since last year. No pericardial effusion. Advanced calcified coronary artery atherosclerosis. Calcified aortic atherosclerosis. Vascular patency is not evaluated in the absence of IV contrast. Mediastinum/Nodes: Bulky chronic thyroid  goiter, noncontrast appearance grossly stable from last year, and previously evaluated on nuclear Medicine thyroid  scan in 2018. Retained food or secretions in the proximal thoracic esophagus, but nondilated esophagus, decompressed distally. No superimposed mediastinal mass or lymphadenopathy. Lungs/Pleura: Moderate layering right pleural effusion with simple fluid density. Small layering left pleural effusion with simple fluid density. Lower lung volumes and atelectatic changes to the trachea and central airways. Widespread bilateral peribronchial patchy and irregular opacity. All lobes affected. Right greater than left posterior basal segment lower lobe consolidation with air bronchograms. No cavitating lung nodules. Musculoskeletal: Visible osseous structures appear stable from last year. Degenerative changes in the spine. No acute or suspicious osseous lesion. CT ABDOMEN PELVIS FINDINGS Hepatobiliary: Small volume perihepatic ascites with simple fluid density is new from last year. Layering gallbladder sludge or vicarious contrast excretion in the gallbladder lumen. No  strong evidence of acute cholecystitis. No discrete liver lesion on this noncontrast exam. Increased liver density which might reflect amiodarone  therapy. Pancreas: Partially atrophied. Spleen: Small volume perisplenic ascites with simple fluid density. No splenomegaly. Adrenals/Urinary Tract: Negative noncontrast kidneys and adrenal glands. Foley catheter within the urinary bladder. Limited bladder detail due to hip arthroplasty streak artifact. Stomach/Bowel: Small volume ascites and generalized mesenteric congestion/edema appearance. Nondilated large and small bowel. Diverticulosis of the sigmoid colon in the pelvis. Diverticulosis at the hepatic flexure. Colon. No discrete bowel inflammation. Appendix not delineated. No pneumoperitoneum. Vascular/Lymphatic: Advanced Aortoiliac calcified atherosclerosis. Vascular patency is not evaluated in the absence of IV contrast. No lymphadenopathy identified. Reproductive: Negative noncontrast appearance. Other: Moderate volume simple fluid density ascites in the pelvis. Musculoskeletal: Intermittent advanced lumbar spine disc and endplate degeneration (with chronic vacuum disc) appears stable from last year. Chronic right hip arthroplasty. No acute or suspicious osseous lesion identified. Small volume of abnormal soft tissue gas in the right flank, subcutaneous and abutting the superficial muscle layer on series 2, image 90.  No regional fluid collection or other acute findings. Generalized abdominal wall, bilateral flank subcutaneous edema/anasarca which is new from last year. IMPRESSION: 1. Diffuse bilateral Bronchopneumonia, bilateral lower lobe consolidation. Moderate Right and small left layering pleural effusions with simple fluid density favoring transudate. 2. Body wall Anasarca. Small volume abdominal and moderate volume simple fluid density Ascites. Superimposed Small volume of nonspecific subcutaneous soft tissue gas along the right posterior flank. But no other  regional abnormality. This is nonspecific, has there been recent subcutaneous injection here? 3. Cardiomegaly, appears progressed from last year. No pericardial effusion. Advanced calcified coronary artery and Aortic Atherosclerosis (ICD10-I70.0). 4. No other acute or inflammatory process identified in the noncontrast chest, abdomen, or pelvis. Bulky Chronic thyroid  goiter. Gallbladder sludge or less likely vicarious contrast excretion. Electronically Signed   By: VEAR Hurst M.D.   On: 12/31/2023 06:20   CT Soft Tissue Neck Wo Contrast Result Date: 12/31/2023 CLINICAL DATA:  Dysphagia, post trauma, submandibular swelling EXAM: CT NECK WITHOUT CONTRAST TECHNIQUE: Multidetector CT imaging of the neck was performed following the standard protocol without intravenous contrast. RADIATION DOSE REDUCTION: This exam was performed according to the departmental dose-optimization program which includes automated exposure control, adjustment of the mA and/or kV according to patient size and/or use of iterative reconstruction technique. COMPARISON:  None Available. FINDINGS: Pharynx and larynx: Motion limited without obvious mass or swelling. Salivary glands: No inflammation, mass, or stone. Thyroid : Enlarged heterogeneous thyroid . Lymph nodes: None enlarged or abnormal density. Vascular: Nondiagnostic evaluation without contrast. Limited intracranial: Negative. Visualized orbits: Negative. Mastoids and visualized paranasal sinuses: Clear. Skeleton: No acute or aggressive process. Upper chest: Numerous nodular opacities throughout the visualized lung apices, suspicious for pneumonia and potentially aspiration. Large right pleural effusion. Other: Mild diffuse subcutaneous edema/stranding. No discrete fluid collection. Debris within the esophagus. IMPRESSION: 1. Numerous nodular opacities throughout the visualized lung apices, suspicious for pneumonia, aspiration, and/or septic emboli. Large right pleural effusion. Recommend  follow-up CT chest to ensure resolution. 2. Debris within the esophagus, which places the patient at risk for aspiration. 3. Mild diffuse subcutaneous edema, possibly anasarca. No discrete fluid collection. 4. Enlarged and heterogeneous thyroid . Recommend thyroid  US  (ref: J Am Coll Radiol. 2015 Feb;12(2): 143-50). Electronically Signed   By: Gilmore GORMAN Molt M.D.   On: 12/31/2023 03:31   CT Maxillofacial Wo Contrast Result Date: 12/31/2023 EXAM: CT OF THE FACE WITHOUT CONTRAST 12/31/2023 02:48:52 AM TECHNIQUE: CT of the face was performed without the administration of intravenous contrast. Multiplanar reformatted images are provided for review. Automated exposure control, iterative reconstruction, and/or weight based adjustment of the mA/kV was utilized to reduce the radiation dose to as low as reasonably achievable. COMPARISON: None available. CLINICAL HISTORY: Facial trauma, blunt. t fell Monday face forward and was seen. Pt was discharged and her caregiver noticed a dark bruised under her neck when she got home. Pt has been struggling to swallow and spitting out her food. Pt presents with blue discoloration in her face. FINDINGS: FACIAL BONES: No acute facial fracture. No mandibular dislocation. No suspicious bone lesion. ORBITS: Globes are intact. No acute traumatic injury. No inflammatory change. SINUSES AND MASTOIDS: No acute abnormality. SOFT TISSUES: Soft tissue irregularity/laceration overlying the frontal bone. IMPRESSION: 1. Soft tissue irregularity/laceration overlying the frontal bone. 2. No maxillofacial fracture. Electronically signed by: Pinkie Pebbles MD 12/31/2023 02:56 AM EDT RP Workstation: [id number]   DG Chest Port 1 View Result Date: 12/31/2023 CLINICAL DATA:  Shortness of breath. EXAM: PORTABLE CHEST 1 VIEW COMPARISON:  August 15, 2023 FINDINGS: The cardiac silhouette is mildly enlarged and unchanged in size. Low lung volumes are noted. Mild areas of atelectasis and/or infiltrate  are seen within the bilateral lung bases. No pleural effusion or pneumothorax is identified. Multilevel degenerative changes seen throughout the thoracic spine. IMPRESSION: Stable cardiomegaly and low lung volumes with mild bibasilar atelectasis and/or infiltrate. Electronically Signed   By: Suzen Dials M.D.   On: 12/31/2023 01:13   DG Elbow 2 Views Left Result Date: 12/28/2023 CLINICAL DATA:  Left elbow pain following a fall. EXAM: LEFT ELBOW - 2 VIEW COMPARISON:  None Available. FINDINGS: There is mild up lifting of the anterior fat pad on the lateral view. No fracture or dislocation seen on two views. There are no oblique views available completeness. IMPRESSION: Evidence of an elbow effusion with no fracture or dislocation seen on two views. This could be an indication of an occult radial head fracture. This could be further evaluated with a complete four view examination at this time or immobilization and repeat radiographs in 10 days. Electronically Signed   By: Elspeth Bathe M.D.   On: 12/28/2023 15:27   DG Foot 2 Views Left Result Date: 12/28/2023 CLINICAL DATA:  Left foot pain following a fall. EXAM: LEFT FOOT - 2 VIEW COMPARISON:  None Available. FINDINGS: Hallux valgus deformity.  No fracture or dislocation seen. IMPRESSION: 1. No fracture. 2. Hallux valgus deformity. Electronically Signed   By: Elspeth Bathe M.D.   On: 12/28/2023 15:24   DG Knee 2 Views Right Result Date: 12/28/2023 CLINICAL DATA:  Right knee pain following a fall. EXAM: RIGHT KNEE - 1-2 VIEW COMPARISON:  None Available. FINDINGS: Marked lateral compartment degenerative changes and mild-to-moderate medial and patellofemoral degenerative changes. No fracture or dislocation. Anterior soft tissue swelling with no definite effusion seen. Atheromatous arterial calcifications. IMPRESSION: 1. Anterior soft tissue swelling. 2. No fracture. 3. Tricompartmental degenerative changes, most marked in the lateral compartment.  Electronically Signed   By: Elspeth Bathe M.D.   On: 12/28/2023 15:23   DG Pelvis Portable Result Date: 12/28/2023 CLINICAL DATA:  Clemens. EXAM: PORTABLE PELVIS 1-2 VIEWS COMPARISON:  Chest, abdomen and pelvis CT dated 03/10/2023. FINDINGS: Stable right hip prosthesis. No fracture or dislocation seen. Stable lumbar spine degenerative changes and scoliosis. Diffuse osteopenia. IMPRESSION: 1. No fracture or dislocation. 2. Stable right hip prosthesis. 3. Diffuse osteopenia. Electronically Signed   By: Elspeth Bathe M.D.   On: 12/28/2023 15:21   CT Cervical Spine Wo Contrast Result Date: 12/28/2023 CLINICAL DATA:  Fall, hit head EXAM: CT CERVICAL SPINE WITHOUT CONTRAST TECHNIQUE: Multidetector CT imaging of the cervical spine was performed without intravenous contrast. Multiplanar CT image reconstructions were also generated. RADIATION DOSE REDUCTION: This exam was performed according to the departmental dose-optimization program which includes automated exposure control, adjustment of the mA and/or kV according to patient size and/or use of iterative reconstruction technique. COMPARISON:  None Available. FINDINGS: Alignment: Slight degenerative anterolisthesis at C2-3 through C4-5 due to facet disease. Skull base and vertebrae: No acute fracture. No primary bone lesion or focal pathologic process. Soft tissues and spinal canal: No prevertebral fluid or swelling. No visible canal hematoma. Disc levels: Moderate bilateral degenerative facet disease. Mild degenerative disc disease at C5-6 and C6-7. Upper chest: No acute findings Other: Enlarged multinodular thyroid . IMPRESSION: Degenerative disc and facet disease.  No acute bony abnormality. Enlarged thyroid  with numerous bilateral nodules. Recommend non emergent thyroid  ultrasound (ref: J Am Coll Radiol. 2015 Feb;12(2): 143-50). Electronically Signed  By: Franky Crease M.D.   On: 12/28/2023 14:07   CT Head Wo Contrast Result Date: 12/28/2023 CLINICAL DATA:  Fall,  hit head EXAM: CT HEAD WITHOUT CONTRAST TECHNIQUE: Contiguous axial images were obtained from the base of the skull through the vertex without intravenous contrast. RADIATION DOSE REDUCTION: This exam was performed according to the departmental dose-optimization program which includes automated exposure control, adjustment of the mA and/or kV according to patient size and/or use of iterative reconstruction technique. COMPARISON:  None Available. FINDINGS: Brain: No acute intracranial abnormality. Specifically, no hemorrhage, hydrocephalus, mass lesion, acute infarction, or significant intracranial injury. Vascular: No hyperdense vessel or unexpected calcification. Skull: No acute calvarial abnormality. Sinuses/Orbits: No acute findings Other: Soft tissue laceration in the left forehead. IMPRESSION: No acute intracranial abnormality. Electronically Signed   By: Franky Crease M.D.   On: 12/28/2023 14:05    Microbiology: Results for orders placed or performed during the hospital encounter of 12/31/23  Blood culture (routine x 2)     Status: Abnormal   Collection Time: 12/31/23  2:00 AM   Specimen: BLOOD  Result Value Ref Range Status   Specimen Description   Final    BLOOD LEFT ANTECUBITAL Performed at [facility], [address], [place], [place] [address]    Special Requests   Final    BOTTLES DRAWN AEROBIC AND ANAEROBIC Blood Culture results may not be optimal due to an inadequate volume of blood received in culture bottles Performed at [facility], [address], [place], [place] [address]    Culture  Setup Time   Final    GRAM POSITIVE COCCI IN PAIRS IN CHAINS IN BOTH AEROBIC AND ANAEROBIC BOTTLES CRITICAL RESULT CALLED TO, READ BACK BY AND VERIFIED WITH: PHARMD EMERSON MILLET [id number] @ 2212 FH    Culture (A)  Final    ENTEROCOCCUS [name] STAPHYLOCOCCUS EPIDERMIDIS THE SIGNIFICANCE OF ISOLATING THIS ORGANISM FROM A SINGLE SET OF BLOOD CULTURES WHEN  MULTIPLE SETS ARE DRAWN IS UNCERTAIN. PLEASE NOTIFY THE MICROBIOLOGY DEPARTMENT WITHIN ONE WEEK IF SPECIATION AND SENSITIVITIES ARE REQUIRED. Performed at [facility], [address], [place], [place] [address]    Report Status 01/02/2024 FINAL  Final   Organism ID, Bacteria ENTEROCOCCUS [name]  Final      Susceptibility   Enterococcus hirae - MIC*    AMPICILLIN  <=2 SENSITIVE Sensitive     VANCOMYCIN  <=0.5 SENSITIVE Sensitive     GENTAMICIN SYNERGY SENSITIVE Sensitive     * ENTEROCOCCUS [name]  Blood Culture ID Panel (Reflexed)     Status: None   Collection Time: 12/31/23  2:00 AM  Result Value Ref Range Status   Enterococcus faecalis NOT DETECTED NOT DETECTED Final   Enterococcus Faecium NOT DETECTED NOT DETECTED Final   Listeria monocytogenes NOT DETECTED NOT DETECTED Final   Staphylococcus species NOT DETECTED NOT DETECTED Final   Staphylococcus aureus (BCID) NOT DETECTED NOT DETECTED Final   Staphylococcus epidermidis NOT DETECTED NOT DETECTED Final   Staphylococcus lugdunensis NOT DETECTED NOT DETECTED Final   Streptococcus species NOT DETECTED NOT DETECTED Final   Streptococcus agalactiae NOT DETECTED NOT DETECTED Final   Streptococcus pneumoniae NOT DETECTED NOT DETECTED Final   Streptococcus pyogenes NOT DETECTED NOT DETECTED Final   A.calcoaceticus-baumannii NOT DETECTED NOT DETECTED Final   Bacteroides fragilis NOT DETECTED NOT DETECTED Final   Enterobacterales NOT DETECTED NOT DETECTED Final   Enterobacter cloacae complex NOT DETECTED NOT DETECTED Final   Escherichia coli NOT DETECTED NOT  DETECTED Final   Klebsiella aerogenes NOT DETECTED NOT DETECTED Final   Klebsiella oxytoca NOT DETECTED NOT DETECTED Final   Klebsiella pneumoniae NOT DETECTED NOT DETECTED Final   Proteus species NOT DETECTED NOT DETECTED Final   Salmonella species NOT DETECTED NOT DETECTED Final   Serratia marcescens NOT DETECTED NOT DETECTED Final   Haemophilus influenzae NOT DETECTED NOT  DETECTED Final   Neisseria meningitidis NOT DETECTED NOT DETECTED Final   Pseudomonas aeruginosa NOT DETECTED NOT DETECTED Final   Stenotrophomonas maltophilia NOT DETECTED NOT DETECTED Final   Candida albicans NOT DETECTED NOT DETECTED Final   Candida auris NOT DETECTED NOT DETECTED Final   Candida glabrata NOT DETECTED NOT DETECTED Final   Candida krusei NOT DETECTED NOT DETECTED Final   Candida parapsilosis NOT DETECTED NOT DETECTED Final   Candida tropicalis NOT DETECTED NOT DETECTED Final   Cryptococcus neoformans/gattii NOT DETECTED NOT DETECTED Final    Comment: Performed at [facility], [address] [place], [place] [address]  Blood culture (routine x 2)     Status: None   Collection Time: 12/31/23  2:15 AM   Specimen: BLOOD  Result Value Ref Range Status   Specimen Description   Final    BLOOD RIGHT ANTECUBITAL Performed at [facility], [address], [place], [place] [address]    Special Requests   Final    BOTTLES DRAWN AEROBIC AND ANAEROBIC Blood Culture adequate volume Performed at [facility], [address] [place], [place] [address]    Culture   Final    NO GROWTH 5 DAYS Performed at [facility], [address] [place], [place] [address]    Report Status 01/05/2024 FINAL  Final  MRSA Next Gen by PCR, Nasal     Status: None   Collection Time: 12/31/23  9:14 AM   Specimen: Nasal Mucosa; Nasal Swab  Result Value Ref Range Status   MRSA by PCR Next Gen NOT DETECTED NOT DETECTED Final    Comment: (NOTE) The GeneXpert MRSA Assay (FDA approved for NASAL specimens only), is one component of a comprehensive MRSA colonization surveillance program. It is not intended to diagnose MRSA infection nor to guide or monitor treatment for MRSA infections. Test performance is not FDA approved in patients less than 61 years old. Performed at [facility], [address], [place], [place] [address]    Urine Culture (for pregnant, neutropenic or urologic patients or patients with an indwelling urinary catheter)     Status: None   Collection Time: 01/01/24 10:37 AM   Specimen: Urine, Catheterized  Result Value Ref Range Status   Specimen Description   Final    URINE, CATHETERIZED Performed at [facility], [address] [place], [place] [address]    Special Requests   Final    NONE Performed at [facility], [address], [place], [place] [address]    Culture   Final    NO GROWTH Performed at [facility], [address] [place], [place] [address]    Report Status 01/02/2024 FINAL  Final  Culture, blood (Routine X 2) w Reflex to ID Panel     Status: None   Collection Time: 01/02/24  7:26 AM   Specimen: BLOOD LEFT HAND  Result Value Ref Range Status   Specimen Description   Final    BLOOD LEFT HAND Performed at [facility], [address], [place], [place]  [address]    Special Requests   Final    BOTTLES DRAWN AEROBIC ONLY Blood Culture results may not be optimal due to an inadequate volume of blood received in culture bottles Performed at [facility], [address] [place], [place] [address]    Culture   Final    NO GROWTH 5 DAYS Performed at [facility], [address] [place], [place] [address]    Report Status 01/07/2024 FINAL  Final  Culture, blood (Routine X 2) w Reflex to ID Panel     Status: None   Collection Time: 01/02/24  7:32 AM   Specimen: BLOOD RIGHT ARM  Result Value Ref Range Status   Specimen Description   Final    BLOOD RIGHT ARM Performed at [facility], [address] [place], [place] [address]    Special Requests   Final    BOTTLES DRAWN AEROBIC ONLY Blood Culture results may not be optimal due to an inadequate volume of blood received in culture bottles Performed at [facility], [address], [place], [place] [address]    Culture    Final    NO GROWTH 5 DAYS Performed at [facility], [address] [place], [place] [address]    Report Status 01/07/2024 FINAL  Final    Labs: CBC: Recent Labs  Lab 01/03/24 0218 01/03/24 1553 01/04/24 0438 01/05/24 0314 01/06/24 0331 01/07/24 0435  WBC 6.3 7.3 6.1 6.1 7.1 6.3  NEUTROABS 5.5 5.8 4.3 3.9 4.7  --   HGB 11.5* 12.6 11.6* 11.3* 12.7 12.2  HCT 37.1 39.8 38.5 37.1 40.3 40.3  MCV 94.2 90.7 93.2 94.6 91.6 94.2  PLT 154 170 152 163 186 176   Basic Metabolic Panel: Recent Labs  Lab 01/02/24 0308 01/03/24 0218 01/03/24 1553 01/04/24 0438 01/05/24 0314 01/06/24 0331 01/07/24 0435  NA 131* 132* 131* 136 134* 130* 132*  K 3.5 3.4* 3.6 3.7 3.8 4.2 3.3*  CL 93* 96* 95* 99 93* 89* 91*  CO2 26 24 28 27 28 26 28   GLUCOSE 157* 145* 113* 88 82 103* 96  BUN 27* 23 23 25* 27* 24* 26*  CREATININE 1.27* 1.06* 0.93 1.02* 1.10* 1.08* 1.13*  CALCIUM 8.8* 9.1 8.5* 8.6* 8.8* 8.9 9.1  MG 1.7 1.7  --  2.0 2.1 2.1  --   PHOS 2.6 2.4*  --  2.8  --  3.9  --    Liver Function Tests: Recent Labs  Lab 01/02/24 0308 01/03/24 0218 01/03/24 1553 01/04/24 0438 01/06/24 0331 01/07/24 0435  AST 33 36 29  --   --  28  ALT 26 27 23   --   --  23  ALKPHOS 152* 161* 141*  --   --  163*  BILITOT 1.7* 2.0* 1.6*  --   --  1.1  PROT 6.1* 6.2* 5.9*  --   --  6.5  ALBUMIN  3.0* 2.7* 2.3* 2.2* 2.7* 2.4*   CBG: Recent Labs  Lab 01/03/24 1530  GLUCAP 133*    Discharge time spent: less than 30 minutes.  Signed: Garnette Pelt, MD [facility] 01/08/2024

## 2024-01-08 NOTE — TOC Transition Note (Signed)
 Transition of Care [facility]) - Discharge Note   Patient Details  Name: Rhonda Marshall MRN: [id number] Date of Birth: 1951/03/14  Transition of Care [facility]) CM/SW Contact:  Jon ONEIDA Anon, RN Phone Number: 01/08/2024, 3:56 PM   Clinical Narrative:    Pt will discharge home with [facility] RN. Pt daughter completing PICC teaching with Pam from [employer]. [facility] will provide [facility] RN services. HH RN orders are in place. Pt daughter will provide transportation at discharge. There are no other TOC needs at this time.    Final next level of care: Home w Home Health Services Barriers to Discharge: No Barriers Identified   Patient Goals and CMS Choice Patient states their goals for this hospitalization and ongoing recovery are:: To return home with home health CMS Medicare.gov Compare Post Acute Care list provided to:: Patient Choice offered to / list presented to : Patient [place] ownership interest in [facility].provided to:: Patient    Discharge Placement                       Discharge Plan and Services Additional resources added to the After Visit Summary for   In-house Referral: NA Discharge Planning Services: CM Consult Post Acute Care Choice: NA          DME Arranged: N/A DME Agency: NA       HH Arranged: RN HH Agency: [facility] Date [facility] Agency Contacted: 01/06/24 Time HH Agency Contacted: 1243 Representative spoke with at [facility] Agency: Cindie Sillmon  Social Drivers of Health (SDOH) Interventions SDOH Screenings   Food Insecurity: No Food Insecurity (12/31/2023)  Housing: Low Risk  (12/31/2023)  Transportation Needs: No Transportation Needs (12/31/2023)  Utilities: Not At Risk (12/31/2023)  Alcohol Screen: Low Risk  (08/18/2023)  Financial Resource Strain: Low Risk  (08/18/2023)  Social Connections: Moderately Integrated (12/31/2023)  Tobacco Use: Medium Risk (12/31/2023)     Readmission Risk Interventions    01/01/2024    1:00 PM 04/02/2023     3:52 PM 03/30/2023    1:51 PM  Readmission Risk Prevention Plan  Transportation Screening Complete Complete Complete  PCP or Specialist Appt within 5-7 Days  Complete Complete  PCP or Specialist Appt within 3-5 Days Complete    Home Care Screening  Complete Complete  Medication Review (RN CM)  Complete Complete  HRI or Home Care Consult Complete    Social Work Consult for Recovery Care Planning/Counseling Complete    Palliative Care Screening Not Applicable    Medication Review [employer]) Complete

## 2024-01-12 ENCOUNTER — Encounter: Payer: Self-pay | Admitting: Cardiology

## 2024-01-12 LAB — LAB REPORT - SCANNED: EGFR: 48

## 2024-01-14 MED ORDER — POTASSIUM CHLORIDE ER 10 MEQ PO TBCR
20.0000 meq | EXTENDED_RELEASE_TABLET | Freq: Once | ORAL | Status: DC
Start: 2024-01-14 — End: 2024-01-20

## 2024-01-14 NOTE — Telephone Encounter (Signed)
 See other pt message from 7/01. Labs are scanned into the chart under Labs.

## 2024-01-14 NOTE — Telephone Encounter (Signed)
 Spoke with pt's mother in law, Avelina and received verbal consent from the pt to speak with her. Dr. Denver suggestions were relayed to [facility]. Pt is to take potassium supplement once daily (20 mg) instead of twice daily. Pt is also to take an extra 20 mg of Lasix  for 3 days over the weekend and get a BMET early next week. Pt to have blood draw at home and will fax us  results on Monday. Pt is to follow up in office. A message was sent to scheduling to find her an appointment. I will make sure she has one. Pt has appointment to see PCP on Friday 7/11. Avelina verbalized understanding. All questions if any were answered.

## 2024-01-18 NOTE — Telephone Encounter (Signed)
 Appointment made 8/18 with Dr. Lavona

## 2024-01-19 ENCOUNTER — Ambulatory Visit: Payer: Self-pay | Admitting: Physician Assistant

## 2024-01-20 ENCOUNTER — Other Ambulatory Visit: Payer: Self-pay

## 2024-01-20 DIAGNOSIS — Z79899 Other long term (current) drug therapy: Secondary | ICD-10-CM

## 2024-01-20 MED ORDER — FUROSEMIDE 20 MG PO TABS: 20.0000 mg | ORAL_TABLET | ORAL | Status: DC | PRN

## 2024-01-26 ENCOUNTER — Telehealth: Payer: Self-pay

## 2024-01-26 NOTE — Telephone Encounter (Signed)
 Received voicemail from [place] with [facility] to confirm appointment with our office to send orders for home health supplies.   Called Becca back, no answer. Left voicemail requesting call back.   Becca: [phone] ext. [phone]  Tomer Chalmers, BSN, RN

## 2024-02-08 ENCOUNTER — Telehealth: Payer: Self-pay

## 2024-02-08 ENCOUNTER — Ambulatory Visit (INDEPENDENT_AMBULATORY_CARE_PROVIDER_SITE_OTHER): Payer: Self-pay | Admitting: Internal Medicine

## 2024-02-08 ENCOUNTER — Other Ambulatory Visit: Payer: Self-pay

## 2024-02-08 ENCOUNTER — Encounter: Payer: Self-pay | Admitting: Internal Medicine

## 2024-02-08 VITALS — BP 135/91 | HR 93 | Temp 97.7°F

## 2024-02-08 DIAGNOSIS — R7881 Bacteremia: Secondary | ICD-10-CM

## 2024-02-08 NOTE — Progress Notes (Signed)
 Patient Active Problem List   Diagnosis Date Noted   Lethargy 01/03/2024   Bacteremia 01/02/2024   Severe sepsis with septic shock (CODE) (HCC) 01/02/2024   Dysphagia 01/01/2024   Aspiration pneumonia of both lungs (HCC) 01/01/2024   Bilateral pleural effusion 01/01/2024   Lactic acidosis 01/01/2024   Permanent atrial fibrillation (HCC) 01/01/2024   Sepsis due to pneumonia (HCC) 12/31/2023   Pressure injury of skin 08/18/2023   Acute CHF (congestive heart failure) (HCC) 08/16/2023   Acute respiratory failure with hypoxia (HCC) 08/16/2023   CKD (chronic kidney disease) stage 3, GFR 30-59 ml/min (HCC) 08/16/2023   Atrial fibrillation with rapid ventricular response (HCC) 04/01/2023   Elevated LFTs 03/31/2023   Acute on chronic systolic CHF (congestive heart failure) (HCC) 03/29/2023   AKI (acute kidney injury) (HCC) 03/11/2023   Hyponatremia 03/11/2023   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   Bilateral shoulder pain 09/10/2022   S/P right hip fracture 08/20/2022   Hypotension 05/25/2022   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   Elevated troponin 01/19/2022   Hypokalemia 01/19/2022   LBBB (left bundle branch block) 01/19/2022   Dehydration 10/10/2019   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   ALPRAZOLAM  (XANAX ) 1 MG TABLET    Take 1 mg by mouth 4 (four) times daily as needed for anxiety.    AMIODARONE  (PACERONE ) 200 MG TABLET    Take 0.5 tablets (100 mg total) by mouth daily.   AMPICILLIN  IVPB    Inject 8 g into the vein daily. As a continuous infusion. 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   CEFTRIAXONE  (ROCEPHIN ) IVPB    Inject 2 g into the vein every 12 (twelve) hours. 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   DICLOFENAC  SODIUM (VOLTAREN ) 1 % GEL    Apply 2 g topically 4 (four) times daily as needed (pain).   DOCUSATE SODIUM  (COLACE) 100 MG CAPSULE    Take 1 capsule (100 mg total) by mouth 2 (two) times daily as needed for mild constipation.   ELIQUIS  5 MG TABS TABLET    Take 1 tablet (5 mg total) by mouth 2 (two) times daily.   FUROSEMIDE  (LASIX ) 20 MG TABLET    Take  1 tablet (20 mg total) by mouth as needed.   LOPERAMIDE  (IMODIUM  A-D) 2 MG TABLET    Take 2 mg by mouth 4 (four) times daily as needed for diarrhea or loose stools.   LOSARTAN  (COZAAR ) 25 MG TABLET    Take 1 tablet (25 mg total) by mouth daily.   METHIMAZOLE  (TAPAZOLE ) 5 MG TABLET    Take 0.5 tablets (2.5 mg total) by mouth daily.   METOPROLOL  SUCCINATE (TOPROL -XL) 100 MG 24 HR TABLET    Take 100 mg by mouth daily. Take with or immediately following a  meal.   MULTIPLE VITAMIN (MULTIVITAMIN WITH MINERALS) TABS TABLET    Take 1 tablet by mouth daily.    OXYCODONE -ACETAMINOPHEN  (PERCOCET) 10-325 MG TABLET    Take 1 tablet by mouth every 4 (four) hours as needed for pain.   SODIUM CHLORIDE  1 G TABLET    Take 1 tablet (1 g total) by mouth as needed.   VITAMIN D , ERGOCALCIFEROL , (DRISDOL) 1.25 MG (50000 UNIT) CAPS CAPSULE    Take 50,000 Units by mouth once a week.  Modified Medications   No medications on file  Discontinued Medications   No medications on file    Subjective: 73 year old female with past medical history of A-fib on Eliquis , hypertension, rheumatoid arthritis presents for hospital follow-up of Enterococcus hirae bacteremia and pneumonia secondary to unclear source, possibly GI translocation.  Patient initially presented following a fall on 6/16 and then presented with dyspnea shortness of breath.  Blood cultures grew 2/4 bottles Enterococcus.  CT showed diffuse bilateral bronchopneumonia, bilateral lower lobe consolidation, fluid or secretions in proximal thoracic esophagus.  GI was consulted recommended SLP.  Outpatient endoscopy.  TTE no vegetation-.  Discharged on ampicillin  ceftriaxone  since 6 weeks Today: no missed  doses. Doing ok. Having a bit trouble waling. Breathing improved.    Review of Systems: Review of Systems  All other systems reviewed and are negative.   Past Medical History:  Diagnosis Date   Anxiety    Atrial fibrillation (HCC)    newly diagnosed 10/2010   Family history of adverse reaction to anesthesia    Sister's BP drops    Hip fracture (HCC) 01/18/2022   Hypertension    Rheumatoid arthritis(714.0)    Subclinical hyperthyroidism     Social History   Tobacco Use   Smoking status: Former    Current packs/day: 0.00    Average packs/day: 0.3 packs/day for 54.2 years (13.6 ttl pk-yrs)    Types: Cigarettes    Start date: 89    Quit date: 10/2019    Years since quitting: 4.3    Passive  exposure: Past   Smokeless tobacco: Never  Vaping Use   Vaping status: Never Used  Substance Use Topics   Alcohol use: Not Currently    Alcohol/week: 0.0 standard drinks of alcohol   Drug use: No    Family History  Problem Relation Age of Onset   Heart attack Mother 53       pacemaker age 52   Hypothyroidism Mother    Cancer Father        colon and prostate   Hypothyroidism Sister    Cancer Sister    Diabetes Brother    CAD Brother 56   Cancer Brother        Renal    Allergies  Allergen Reactions   Clindamycin/Lincomycin Rash   Latex Rash   Plaquenil  [Hydroxychloroquine ] Other (See Comments)    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  Health Maintenance  Topic Date Due   Medicare Annual Wellness (AWV)  Never done   COVID-19 Vaccine (1) Never done   Hepatitis C Screening  Never done   DTaP/Tdap/Td (1 - Tdap) Never done   Pneumococcal Vaccine: 50+ Years (1 of 2 - PCV) Never done   Zoster Vaccines- Shingrix (1 of 2) Never done   Colonoscopy  Never done   MAMMOGRAM  06/27/2016   INFLUENZA VACCINE  02/12/2024   DEXA SCAN  Completed   Hepatitis B Vaccines  Aged Out   HPV VACCINES  Aged Out   Meningococcal B Vaccine  Aged Out    Objective:  Vitals:   02/08/24 0903  BP: (!) 135/91  Pulse: 93  Temp: 97.7 F (36.5 C)  TempSrc: Temporal  SpO2: 92%   There is no height or weight on file to calculate BMI.  Physical Exam Constitutional:      Appearance: Normal appearance.  HENT:     Head: Normocephalic and atraumatic.     Right Ear: Tympanic membrane normal.     Left Ear: Tympanic membrane normal.     Nose: Nose normal.     Mouth/Throat:     Mouth: Mucous membranes are moist.  Eyes:     Extraocular Movements: Extraocular movements intact.     Conjunctiva/sclera: Conjunctivae normal.     Pupils: Pupils are equal, round, and reactive to light.  Cardiovascular:     Rate and Rhythm: Normal rate and regular rhythm.     Heart sounds: No murmur heard.    No friction rub.  No gallop.  Pulmonary:     Effort: Pulmonary effort is normal.     Breath sounds: Normal breath sounds.  Abdominal:     General: Abdomen is flat.     Palpations: Abdomen is soft.  Skin:    General: Skin is warm and dry.  Neurological:     General: No focal deficit present.     Mental Status: She is alert and oriented to person, place, and time.  Psychiatric:        Mood and Affect: Mood normal.    Physical Exam   Lab Results Lab Results  Component Value Date   WBC 6.3 01/07/2024   HGB 12.2 01/07/2024   HCT 40.3 01/07/2024   MCV 94.2 01/07/2024   PLT 176 01/07/2024    Lab Results  Component Value Date   CREATININE 1.13 (H) 01/07/2024   BUN 26 (H) 01/07/2024   NA 132 (L) 01/07/2024   K 3.3 (L) 01/07/2024   CL 91 (L) 01/07/2024   CO2 28 01/07/2024    Lab Results  Component Value Date   ALT 23 01/07/2024   AST 28 01/07/2024   ALKPHOS 163 (H) 01/07/2024   BILITOT 1.1 01/07/2024    Lab Results  Component Value Date   LDLDIRECT 117 (H) 09/20/2008   No results found for: LABRPR, RPRTITER No results found for: HIV1RNAQUANT, HIV1RNAVL, CD4TABS   Problem List Items Addressed This Visit   None  Results   Assessment/Plan #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  CT head was negative.  CT chest showed diffuse bilateral bronchopneumonia, bilateral lower lobe consolidation.  Also showed fluid or secretions in proximal thoracic esophagus GI consulted, rule recommend SLP and outpatient EGD.  Patient has certain shortness of breath, prior to that she noted she had diarrhea.  This  could have led to bacteremia with endocarditis of to emboli to lungs versus pneumonia leading to bacteremia.  PE, treated as needed given history of thoracic/neck radiation.  Patient empirically treated with ampicillin  and ceftriaxone  x 6 weeks for  endocarditis.  EOT 02/13/2024. Plan: Stop abx after last dose.no fevers or chills Follows with cards aug 18th.  #Medication management #PICC 7/21 WBC 4.6, SCr 1.35, ESR 47, CRP 22(28) Pull picc F/Uin one month off of abx     Loney Stank, MD [facility] 02/08/2024, 9:08 AM   I have personally spent 45 minutes involved in face-to-face and non-face-to-face activities for this patient on the day of the visit. Professional time spent includes the following activities: Preparing to see the patient (review of tests), Obtaining and/or reviewing separately obtained history (admission/discharge record), Performing a medically appropriate examination and/or evaluation , Ordering medications/tests/procedures, referring and communicating with other health care professionals, Documenting clinical information in the EMR, Independently interpreting results (not separately reported), Communicating results to the patient/family/caregiver, Counseling and educating the patient/family/caregiver and Care coordination (not separately reported).

## 2024-02-08 NOTE — Progress Notes (Signed)
 Patient Active Problem List   Diagnosis Date Noted  . Lethargy 01/03/2024  . Bacteremia 01/02/2024  . Severe sepsis with septic shock (CODE) (HCC) 01/02/2024  . Dysphagia 01/01/2024  . Aspiration pneumonia of both lungs (HCC) 01/01/2024  . Bilateral pleural effusion 01/01/2024  . Lactic acidosis 01/01/2024  . Permanent atrial fibrillation (HCC) 01/01/2024  . Sepsis due to pneumonia (HCC) 12/31/2023  . Pressure injury of skin 08/18/2023  . Acute CHF (congestive heart failure) (HCC) 08/16/2023  . Acute respiratory failure with hypoxia (HCC) 08/16/2023  . CKD (chronic kidney disease) stage 3, GFR 30-59 ml/min (HCC) 08/16/2023  . Atrial fibrillation with rapid ventricular response (HCC) 04/01/2023  . Elevated LFTs 03/31/2023  . Acute on chronic systolic CHF (congestive heart failure) (HCC) 03/29/2023  . AKI (acute kidney injury) (HCC) 03/11/2023  . Hyponatremia 03/11/2023  . 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  . Bilateral shoulder pain 09/10/2022  . S/P right hip fracture 08/20/2022  . Hypotension 05/25/2022  . 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  . Elevated troponin 01/19/2022  . Hypokalemia 01/19/2022  . LBBB (left bundle branch block) 01/19/2022  . Dehydration 10/10/2019  . 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 Leg swelling 05/05/2019  . Hyperthyroidism 12/01/2016  . Dyspnea on exertion 07/30/2015  . Chronic systolic CHF (congestive heart failure) (HCC) 07/30/2015  . Right knee pain 05/31/2015  . Rheumatoid arthritis (HCC) 07/21/2014  . Smoker 07/21/2014  . Anticoagulation adequate 06/05/2014  . Longstanding persistent atrial fibrillation (HCC) 11/14/2010  . Reflux 11/14/2010    Patient's Medications  New Prescriptions   No medications on file  Previous Medications   ACETAMINOPHEN  (TYLENOL ) 500 MG TABLET    Take 500 mg by mouth as needed for moderate pain.   ALPRAZOLAM  (XANAX ) 1 MG TABLET    Take 1 mg by mouth 4 (four) times  daily as needed for anxiety.   AMIODARONE  (PACERONE ) 200 MG TABLET    Take 0.5 tablets (100 mg total) by mouth daily.   AMPICILLIN  IVPB    Inject 8 g into the vein daily. As a continuous infusion. 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   CEFTRIAXONE  (ROCEPHIN ) IVPB    Inject 2 g into the vein every 12 (twelve) hours. 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   DICLOFENAC  SODIUM (VOLTAREN ) 1 % GEL    Apply 2 g topically 4 (four) times daily as needed (pain).   DOCUSATE SODIUM  (COLACE) 100 MG CAPSULE    Take 1 capsule (100 mg total) by mouth 2 (two) times daily as needed for mild constipation.   ELIQUIS  5 MG TABS TABLET    Take 1 tablet (5 mg total) by mouth 2 (two) times daily.   FUROSEMIDE  (LASIX ) 20 MG TABLET    Take  1 tablet (20 mg total) by mouth as needed.   LOPERAMIDE  (IMODIUM  A-D) 2 MG TABLET    Take 2 mg by mouth 4 (four) times daily as needed for diarrhea or loose stools.   LOSARTAN  (COZAAR ) 25 MG TABLET    Take 1 tablet (25 mg total) by mouth daily.   METHIMAZOLE  (TAPAZOLE ) 5 MG TABLET    Take 0.5 tablets (2.5 mg total) by mouth daily.   METOPROLOL  SUCCINATE (TOPROL -XL) 100 MG 24 HR TABLET    Take 100 mg by mouth daily. Take  with or immediately following a meal.   MULTIPLE VITAMIN (MULTIVITAMIN WITH MINERALS) TABS TABLET    Take 1 tablet by mouth daily.    OXYCODONE -ACETAMINOPHEN  (PERCOCET) 10-325 MG TABLET    Take 1 tablet by mouth every 4 (four) hours as needed for pain.   SODIUM CHLORIDE  1 G TABLET    Take 1 tablet (1 g total) by mouth as needed.   VITAMIN D , ERGOCALCIFEROL , (DRISDOL) 1.25 MG (50000 UNIT) CAPS CAPSULE    Take 50,000 Units by mouth once a week.  Modified Medications   No medications on file  Discontinued Medications   No medications on file    Subjective: 73 year old female with past medical history of A-fib on Eliquis , hypertension, rheumatoid arthritis presents for hospital follow-up of Enterococcus hirae bacteremia and pneumonia secondary to unclear source, possibly GI translocation.  Patient initially presented following a fall on 6/16 and then presented with dyspnea shortness of breath.  Blood cultures grew 2/4 bottles Enterococcus.  CT showed diffuse bilateral bronchopneumonia, bilateral lower lobe consolidation, fluid or secretions in proximal thoracic esophagus.  GI was consulted recommended SLP.  Outpatient endoscopy.  TTE no vegetation-.  Discharged on ampicillin  ceftriaxone  since 6 weeks Today   Review of Systems: Review of Systems  All other systems reviewed and are negative.   Past Medical History:  Diagnosis Date  . Anxiety   . Atrial fibrillation (HCC)    newly diagnosed 10/2010  . Family history of adverse reaction to anesthesia    Sister's BP drops   . Hip fracture (HCC) 01/18/2022  . Hypertension   . Rheumatoid arthritis(714.0)   . Subclinical hyperthyroidism     Social History   Tobacco Use  . Smoking status: Former    Current packs/day: 0.00    Average packs/day: 0.3 packs/day for 54.2 years (13.6 ttl pk-yrs)    Types: Cigarettes    Start date: 57    Quit date: 10/2019    Years since quitting: 4.3    Passive exposure: Past  . Smokeless tobacco: Never   Vaping Use  . Vaping status: Never Used  Substance Use Topics  . Alcohol use: Not Currently    Alcohol/week: 0.0 standard drinks of alcohol  . Drug use: No    Family History  Problem Relation Age of Onset  . Heart attack Mother 58       pacemaker age 41  . Hypothyroidism Mother   . Cancer Father        colon and prostate  . Hypothyroidism Sister   . Cancer Sister   . Diabetes Brother   . CAD Brother 70  . Cancer Brother        Renal    Allergies  Allergen Reactions  . Clindamycin/Lincomycin Rash  . Latex Rash  . 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 Medicare Annual Wellness (AWV)  Never done  . COVID-19 Vaccine (1) Never done  . Hepatitis C Screening  Never done  . DTaP/Tdap/Td (1 - Tdap) Never done  . Pneumococcal Vaccine: 50+ Years (1 of 2 - PCV) Never done  . Zoster Vaccines- Shingrix (1 of 2) Never done  . Colonoscopy  Never done  . MAMMOGRAM  06/27/2016  . INFLUENZA VACCINE  02/12/2024  . DEXA SCAN  Completed  . Hepatitis B Vaccines  Aged Out  . HPV VACCINES  Aged Out  . Meningococcal B Vaccine  Aged Out    Objective:  There were no vitals filed for this visit. There is no height or weight on file to calculate BMI.  Physical Exam Constitutional:      Appearance: Normal appearance.  HENT:     Head: Normocephalic and atraumatic.     Right Ear: Tympanic membrane normal.     Left Ear: Tympanic membrane normal.     Nose: Nose normal.     Mouth/Throat:     Mouth: Mucous membranes are moist.  Eyes:     Extraocular Movements: Extraocular movements intact.     Conjunctiva/sclera: Conjunctivae normal.     Pupils: Pupils are equal, round, and reactive to light.  Cardiovascular:     Rate and Rhythm: Normal rate and regular rhythm.     Heart sounds: No murmur heard.    No friction rub. No gallop.  Pulmonary:     Effort: Pulmonary effort is normal.     Breath sounds:  Normal breath sounds.  Abdominal:     General: Abdomen is flat.     Palpations: Abdomen is soft.  Musculoskeletal:        General: Normal range of motion.  Skin:    General: Skin is warm and dry.  Neurological:     General: No focal deficit present.     Mental Status: She is alert and oriented to person, place, and time.  Psychiatric:        Mood and Affect: Mood normal.    Physical Exam   Lab Results Lab Results  Component Value Date   WBC 6.3 01/07/2024   HGB 12.2 01/07/2024   HCT 40.3 01/07/2024   MCV 94.2 01/07/2024   PLT 176 01/07/2024    Lab Results  Component Value Date   CREATININE 1.13 (H) 01/07/2024   BUN 26 (H) 01/07/2024   NA 132 (L) 01/07/2024   K 3.3 (L) 01/07/2024   CL 91 (L) 01/07/2024   CO2 28 01/07/2024    Lab Results  Component Value Date   ALT 23 01/07/2024   AST 28 01/07/2024   ALKPHOS 163 (H) 01/07/2024   BILITOT 1.1 01/07/2024    Lab Results  Component Value Date   LDLDIRECT 117 (H) 09/20/2008   No results found for: LABRPR, RPRTITER No results found for: HIV1RNAQUANT, HIV1RNAVL, CD4TABS   Problem List Items Addressed This Visit   None  Results   Assessment/Plan #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  CT head was negative.  CT chest showed diffuse bilateral bronchopneumonia, bilateral lower lobe consolidation.  Also showed fluid or secretions in proximal thoracic esophagus GI consulted, rule recommend SLP and outpatient EGD.  Patient has certain shortness of breath, prior to that she noted she had diarrhea.  This could have led to bacteremia with endocarditis of to emboli to lungs versus pneumonia leading to bacteremia.  PE, treated as needed given history of thoracic/neck radiation.  Patient empirically treated with ampicillin  and ceftriaxone  x 6 weeks for endocarditis.  EOT  02/13/2024. #Medication management #PICC      Loney Stank, MD [facility] 02/08/2024, 4:12 AM

## 2024-02-08 NOTE — Telephone Encounter (Signed)
 Per Dr. Dennise ok to stop IV abx after last dose and pull picc on 8/2. Sent a message to [employer] about orders as well.

## 2024-02-10 ENCOUNTER — Other Ambulatory Visit (HOSPITAL_COMMUNITY): Payer: Self-pay

## 2024-02-10 ENCOUNTER — Telehealth: Payer: Self-pay

## 2024-02-10 ENCOUNTER — Other Ambulatory Visit: Payer: Self-pay | Admitting: *Deleted

## 2024-02-10 MED ORDER — LOSARTAN POTASSIUM 25 MG PO TABS: 25.0000 mg | ORAL_TABLET | Freq: Every day | ORAL | 11 refills | Status: DC

## 2024-02-10 NOTE — Telephone Encounter (Signed)
 Received call from patient's daughter requesting Losartan  refills. Notified her this request would need to be directed to cardiology. She has sent a MyChart message to Dr. Denver office, provided her with their phone number in case she needs to follow up.   Evert Wenrich, BSN, RN

## 2024-02-26 NOTE — Progress Notes (Deleted)
 Cardiology Office Note:   Date:  02/26/2024  ID:  Dillan Lunden, DOB March 19, 1951, MRN [id number] PCP: [name] LABOR, MD  [facility] Providers Cardiologist:  Lynwood Schilling, MD {  History of Present Illness:   Kateryn Marasigan is a 73 y.o. female with a hx of persistent atrial fibrillation, chronic anticoagulation, LBBB, hypertension, chronic systolic heart failure, and RA.  She underwent cardioversion January 2016.  She was seen in the hospital in July 2023 for closed hip fracture.  The hospital course was complicated with A-fib RVR and she subsequently underwent DCCV on amiodarone  but unfortunately returned to atrial fibrillation..  Echocardiogram 01/19/2022 with LVEF 30-35%, mildly reduced RV function, mild MR, and mild left atrial enlargement.  Rate control was titrated and she was discharged on 100 mg Toprol , and p.o. amiodarone  taper.  GDMT titrated for acute reduction in her EF.   Her most recent echocardiogram in September 2024 demonstrated EF to be 30 to 35%.  She was in the hospital at that time with acute on chronic systolic heart failure.  Of note the etiology of her cardiomyopathy was thought possibly to be persistent atrial fibrillation with rapid rate.  She had low blood pressure which precluded much in the way of med titration.  She was not put on Farxiga because of urinary tract infections.  Entresto  and spironolactone  had to be stopped secondary to hypotension.  Jardiance  was stopped in the past as well.   Since I last saw her she was in the hospital with sepsis related to pneumonia.  She had acute respiratory failure.  I looked through these notes for this appointment.  The echo done at that time demonstrated moderate mitral valve regurgitation with an EF of 30 to 35%.  She has chronic atrial fibrillation.  She had AKI.  Of note she was managed empirically for possible endocarditis with 6 weeks of IV antibiotics.  She did not have a TEE because of her significant  decompensation and acute illness.  ***   *** She presents today for follow-up and she has done relatively well.  She does get short of breath with activities.  Her weights have been relatively stable.  She has a caregiver who takes excellent care of her.  They wear only about once a week.  She is not currently taking any diuretic.  They do try to watch the salt although occasionally they use some salt.  She is not having any new PND or orthopnea though she chronically sleeps on 3 pillows.  She is not having any new chest pressure, neck or arm discomfort.  She has had no weight gain as mentioned and she does have some mild chronic right greater than left lower extremity edema.  She gets around slowly with a shuffling gait.  She actually looks frail for age  ROS: ***  Studies Reviewed:    EKG:       ***  Risk Assessment/Calculations:   {Does this patient have ATRIAL FIBRILLATION?:[phone]} No BP recorded.  {Refresh Note OR Click here to enter BP  :1}***        Physical Exam:   VS:  There were no vitals taken for this visit.   Wt Readings from Last 3 Encounters:  01/08/24 117 lb 4.6 oz (53.2 kg)  12/28/23 109 lb (49.4 kg)  11/19/23 107 lb 12.8 oz (48.9 kg)     GEN: Well nourished, well developed in no acute distress NECK: No JVD; No carotid bruits CARDIAC: ***RR, *** murmurs, rubs, gallops  RESPIRATORY:  Clear to auscultation without rales, wheezing or rhonchi  ABDOMEN: Soft, non-tender, non-distended EXTREMITIES:  No edema; No deformity   ASSESSMENT AND PLAN:   Chronic systolic HF:   ***  The patient has acute on chronic systolic heart failure and I think she is a little bit volume up today.  I going to give her Lasix  20 mg a day for 3 days.  Etiology of her cardiomyopathy is not entirely clear but it is thought possibly to be rate related.  I am going to increase her metoprolol  to 100 mg in the morning and see if she can tolerate an extra 50 in the evening.  We talked about salt  and fluid restriction.  Unfortunately med titration will be limited by her hypotension.   Persistent atrial fib:   ***    I am going to go ahead and continue the amiodarone  for rate control only.  She tolerates anticoagulation.  She tolerates anticoagulation.  Possible endocarditis: ***      Follow up ***  Signed, Lynwood Schilling, MD

## 2024-02-28 ENCOUNTER — Inpatient Hospital Stay (HOSPITAL_COMMUNITY)
Admission: EM | Admit: 2024-02-28 | Discharge: 2024-03-08 | DRG: 871 | Disposition: A | Discharge status: Home Health | Attending: Internal Medicine | Admitting: Internal Medicine

## 2024-02-28 ENCOUNTER — Encounter (INDEPENDENT_AMBULATORY_CARE_PROVIDER_SITE_OTHER): Payer: Self-pay

## 2024-02-28 ENCOUNTER — Encounter: Payer: Self-pay | Admitting: Cardiology

## 2024-02-28 ENCOUNTER — Encounter (HOSPITAL_COMMUNITY): Payer: Self-pay

## 2024-02-28 ENCOUNTER — Emergency Department (HOSPITAL_COMMUNITY)

## 2024-02-28 ENCOUNTER — Other Ambulatory Visit: Payer: Self-pay

## 2024-02-28 DIAGNOSIS — R54 Age-related physical debility: Secondary | ICD-10-CM | POA: Diagnosis present

## 2024-02-28 DIAGNOSIS — I5022 Chronic systolic (congestive) heart failure: Secondary | ICD-10-CM | POA: Diagnosis not present

## 2024-02-28 DIAGNOSIS — I4891 Unspecified atrial fibrillation: Secondary | ICD-10-CM | POA: Diagnosis not present

## 2024-02-28 DIAGNOSIS — M7989 Other specified soft tissue disorders: Secondary | ICD-10-CM | POA: Diagnosis not present

## 2024-02-28 DIAGNOSIS — J189 Pneumonia, unspecified organism: Secondary | ICD-10-CM | POA: Diagnosis present

## 2024-02-28 DIAGNOSIS — G9340 Encephalopathy, unspecified: Secondary | ICD-10-CM | POA: Diagnosis not present

## 2024-02-28 DIAGNOSIS — E871 Hypo-osmolality and hyponatremia: Secondary | ICD-10-CM | POA: Diagnosis present

## 2024-02-28 DIAGNOSIS — Z8679 Personal history of other diseases of the circulatory system: Secondary | ICD-10-CM

## 2024-02-28 DIAGNOSIS — I5043 Acute on chronic combined systolic (congestive) and diastolic (congestive) heart failure: Secondary | ICD-10-CM | POA: Diagnosis present

## 2024-02-28 DIAGNOSIS — I5021 Acute systolic (congestive) heart failure: Secondary | ICD-10-CM | POA: Diagnosis not present

## 2024-02-28 DIAGNOSIS — E0590 Thyrotoxicosis, unspecified without thyrotoxic crisis or storm: Secondary | ICD-10-CM | POA: Diagnosis present

## 2024-02-28 DIAGNOSIS — R627 Adult failure to thrive: Secondary | ICD-10-CM | POA: Diagnosis present

## 2024-02-28 DIAGNOSIS — I130 Hypertensive heart and chronic kidney disease with heart failure and stage 1 through stage 4 chronic kidney disease, or unspecified chronic kidney disease: Secondary | ICD-10-CM | POA: Diagnosis present

## 2024-02-28 DIAGNOSIS — E1122 Type 2 diabetes mellitus with diabetic chronic kidney disease: Secondary | ICD-10-CM | POA: Diagnosis present

## 2024-02-28 DIAGNOSIS — R579 Shock, unspecified: Secondary | ICD-10-CM | POA: Diagnosis not present

## 2024-02-28 DIAGNOSIS — E872 Acidosis, unspecified: Secondary | ICD-10-CM | POA: Diagnosis present

## 2024-02-28 DIAGNOSIS — E8809 Other disorders of plasma-protein metabolism, not elsewhere classified: Secondary | ICD-10-CM | POA: Diagnosis present

## 2024-02-28 DIAGNOSIS — I4821 Permanent atrial fibrillation: Secondary | ICD-10-CM | POA: Diagnosis present

## 2024-02-28 DIAGNOSIS — M069 Rheumatoid arthritis, unspecified: Secondary | ICD-10-CM | POA: Diagnosis present

## 2024-02-28 DIAGNOSIS — R6520 Severe sepsis without septic shock: Secondary | ICD-10-CM | POA: Insufficient documentation

## 2024-02-28 DIAGNOSIS — N179 Acute kidney failure, unspecified: Secondary | ICD-10-CM | POA: Diagnosis present

## 2024-02-28 DIAGNOSIS — I447 Left bundle-branch block, unspecified: Secondary | ICD-10-CM | POA: Diagnosis present

## 2024-02-28 DIAGNOSIS — N189 Chronic kidney disease, unspecified: Secondary | ICD-10-CM | POA: Diagnosis not present

## 2024-02-28 DIAGNOSIS — I959 Hypotension, unspecified: Secondary | ICD-10-CM | POA: Diagnosis not present

## 2024-02-28 DIAGNOSIS — A419 Sepsis, unspecified organism: Secondary | ICD-10-CM | POA: Diagnosis present

## 2024-02-28 DIAGNOSIS — E876 Hypokalemia: Secondary | ICD-10-CM | POA: Diagnosis present

## 2024-02-28 DIAGNOSIS — J9601 Acute respiratory failure with hypoxia: Secondary | ICD-10-CM | POA: Diagnosis present

## 2024-02-28 DIAGNOSIS — L89616 Pressure-induced deep tissue damage of right heel: Secondary | ICD-10-CM | POA: Diagnosis present

## 2024-02-28 DIAGNOSIS — I482 Chronic atrial fibrillation, unspecified: Secondary | ICD-10-CM | POA: Diagnosis not present

## 2024-02-28 DIAGNOSIS — I509 Heart failure, unspecified: Principal | ICD-10-CM

## 2024-02-28 DIAGNOSIS — Z7901 Long term (current) use of anticoagulants: Secondary | ICD-10-CM

## 2024-02-28 DIAGNOSIS — J690 Pneumonitis due to inhalation of food and vomit: Secondary | ICD-10-CM | POA: Diagnosis present

## 2024-02-28 DIAGNOSIS — Z66 Do not resuscitate: Secondary | ICD-10-CM | POA: Diagnosis present

## 2024-02-28 DIAGNOSIS — I5023 Acute on chronic systolic (congestive) heart failure: Secondary | ICD-10-CM | POA: Diagnosis not present

## 2024-02-28 DIAGNOSIS — I330 Acute and subacute infective endocarditis: Secondary | ICD-10-CM | POA: Diagnosis present

## 2024-02-28 DIAGNOSIS — G9341 Metabolic encephalopathy: Secondary | ICD-10-CM | POA: Diagnosis present

## 2024-02-28 DIAGNOSIS — J90 Pleural effusion, not elsewhere classified: Secondary | ICD-10-CM | POA: Diagnosis present

## 2024-02-28 DIAGNOSIS — E46 Unspecified protein-calorie malnutrition: Secondary | ICD-10-CM | POA: Diagnosis present

## 2024-02-28 DIAGNOSIS — R578 Other shock: Secondary | ICD-10-CM | POA: Diagnosis present

## 2024-02-28 DIAGNOSIS — Z1152 Encounter for screening for COVID-19: Secondary | ICD-10-CM

## 2024-02-28 DIAGNOSIS — D72819 Decreased white blood cell count, unspecified: Secondary | ICD-10-CM

## 2024-02-28 DIAGNOSIS — Z8249 Family history of ischemic heart disease and other diseases of the circulatory system: Secondary | ICD-10-CM

## 2024-02-28 DIAGNOSIS — N1831 Chronic kidney disease, stage 3a: Secondary | ICD-10-CM | POA: Diagnosis present

## 2024-02-28 DIAGNOSIS — K224 Dyskinesia of esophagus: Secondary | ICD-10-CM | POA: Diagnosis present

## 2024-02-28 DIAGNOSIS — Z79899 Other long term (current) drug therapy: Secondary | ICD-10-CM

## 2024-02-28 DIAGNOSIS — J9602 Acute respiratory failure with hypercapnia: Secondary | ICD-10-CM | POA: Diagnosis present

## 2024-02-28 DIAGNOSIS — R601 Generalized edema: Secondary | ICD-10-CM | POA: Diagnosis present

## 2024-02-28 DIAGNOSIS — G4700 Insomnia, unspecified: Secondary | ICD-10-CM | POA: Diagnosis present

## 2024-02-28 DIAGNOSIS — Z96641 Presence of right artificial hip joint: Secondary | ICD-10-CM | POA: Diagnosis present

## 2024-02-28 DIAGNOSIS — F419 Anxiety disorder, unspecified: Secondary | ICD-10-CM | POA: Diagnosis present

## 2024-02-28 DIAGNOSIS — Z87891 Personal history of nicotine dependence: Secondary | ICD-10-CM

## 2024-02-28 DIAGNOSIS — Z91040 Latex allergy status: Secondary | ICD-10-CM

## 2024-02-28 LAB — URINALYSIS, ROUTINE W REFLEX MICROSCOPIC
Bilirubin Urine: NEGATIVE
Glucose, UA: NEGATIVE mg/dL
Hgb urine dipstick: NEGATIVE
Ketones, ur: NEGATIVE mg/dL
Leukocytes,Ua: NEGATIVE
Nitrite: NEGATIVE
Protein, ur: 100 mg/dL — AB
Specific Gravity, Urine: 1.013 (ref 1.005–1.030)
pH: 5 (ref 5.0–8.0)

## 2024-02-28 LAB — BLOOD GAS, VENOUS
Acid-Base Excess: 8.3 mmol/L — ABNORMAL HIGH (ref 0.0–2.0)
Bicarbonate: 36.4 mmol/L — ABNORMAL HIGH (ref 20.0–28.0)
O2 Saturation: 37.3 %
Patient temperature: 37
pCO2, Ven: 66 mmHg — ABNORMAL HIGH (ref 44–60)
pH, Ven: 7.35 (ref 7.25–7.43)
pO2, Ven: <31 mmHg — CL (ref 32–45)

## 2024-02-28 LAB — BRAIN NATRIURETIC PEPTIDE: B Natriuretic Peptide: 3431.8 pg/mL — ABNORMAL HIGH (ref 0.0–100.0)

## 2024-02-28 LAB — HEPATIC FUNCTION PANEL
ALT: 14 U/L (ref 0–44)
AST: 31 U/L (ref 15–41)
Albumin: 1.9 g/dL — ABNORMAL LOW (ref 3.5–5.0)
Alkaline Phosphatase: 135 U/L — ABNORMAL HIGH (ref 38–126)
Bilirubin, Direct: 0.5 mg/dL — ABNORMAL HIGH (ref 0.0–0.2)
Indirect Bilirubin: 0.9 mg/dL (ref 0.3–0.9)
Total Bilirubin: 1.4 mg/dL — ABNORMAL HIGH (ref 0.0–1.2)
Total Protein: 7 g/dL (ref 6.5–8.1)

## 2024-02-28 LAB — I-STAT CG4 LACTIC ACID, ED
Lactic Acid, Venous: 2.8 mmol/L (ref 0.5–1.9)
Lactic Acid, Venous: 2.5 mmol/L (ref 0.5–1.9)

## 2024-02-28 LAB — CBC WITH DIFFERENTIAL/PLATELET
Abs Granulocyte: 2.7 K/uL (ref 1.5–6.5)
Abs Immature Granulocytes: 0.02 K/uL (ref 0.00–0.07)
Basophils Absolute: 0 K/uL (ref 0.0–0.1)
Basophils Relative: 1 %
Eosinophils Absolute: 0 K/uL (ref 0.0–0.5)
Eosinophils Relative: 0 %
HCT: 43.3 % (ref 36.0–46.0)
Hemoglobin: 12.9 g/dL (ref 12.0–15.0)
Immature Granulocytes: 1 %
Lymphocytes Relative: 19 %
Lymphs Abs: 0.7 K/uL (ref 0.7–4.0)
MCH: 25.7 pg — ABNORMAL LOW (ref 26.0–34.0)
MCHC: 29.8 g/dL — ABNORMAL LOW (ref 30.0–36.0)
MCV: 86.4 fL (ref 80.0–100.0)
Monocytes Absolute: 0.3 K/uL (ref 0.1–1.0)
Monocytes Relative: 9 %
Neutro Abs: 2.7 K/uL (ref 1.7–7.7)
Neutrophils Relative %: 70 %
Platelets: 180 K/uL (ref 150–400)
RBC: 5.01 MIL/uL (ref 3.87–5.11)
RDW: 23.1 % — ABNORMAL HIGH (ref 11.5–15.5)
WBC: 3.8 K/uL — ABNORMAL LOW (ref 4.0–10.5)
nRBC: 0 % (ref 0.0–0.2)

## 2024-02-28 LAB — BASIC METABOLIC PANEL WITH GFR
Anion gap: 16 — ABNORMAL HIGH (ref 5–15)
BUN: 30 mg/dL — ABNORMAL HIGH (ref 8–23)
CO2: 30 mmol/L (ref 22–32)
Calcium: 9.6 mg/dL (ref 8.9–10.3)
Chloride: 87 mmol/L — ABNORMAL LOW (ref 98–111)
Creatinine, Ser: 1.68 mg/dL — ABNORMAL HIGH (ref 0.44–1.00)
GFR, Estimated: 32 mL/min — ABNORMAL LOW (ref >60)
Glucose, Bld: 128 mg/dL — ABNORMAL HIGH (ref 70–99)
Potassium: 4.3 mmol/L (ref 3.5–5.1)
Sodium: 133 mmol/L — ABNORMAL LOW (ref 135–145)

## 2024-02-28 LAB — RESP PANEL BY RT-PCR (RSV, FLU A&B, COVID)  RVPGX2
Influenza A by PCR: NEGATIVE
Influenza B by PCR: NEGATIVE
Resp Syncytial Virus by PCR: NEGATIVE
SARS Coronavirus 2 by RT PCR: NEGATIVE

## 2024-02-28 LAB — MAGNESIUM: Magnesium: 2.1 mg/dL (ref 1.7–2.4)

## 2024-02-28 LAB — TROPONIN I (HIGH SENSITIVITY)
Troponin I (High Sensitivity): 18 ng/L — ABNORMAL HIGH (ref <18)
Troponin I (High Sensitivity): 15 ng/L (ref <18)

## 2024-02-28 MED ORDER — FUROSEMIDE 10 MG/ML IJ SOLN
20.0000 mg | Freq: Once | INTRAMUSCULAR | Status: AC
  Administered 2024-02-28: 20 mg via INTRAVENOUS
  Filled 2024-02-28: qty 4

## 2024-02-28 MED ORDER — PIPERACILLIN-TAZOBACTAM 3.375 G IVPB 30 MIN
3.3750 g | Freq: Once | INTRAVENOUS | Status: AC
  Administered 2024-02-28: 3.375 g via INTRAVENOUS
  Filled 2024-02-28: qty 50

## 2024-02-28 NOTE — ED Provider Notes (Signed)
 [place] [facility] AT [facility] Provider Note   CSN: [id number] Arrival date & time: 02/28/24  1739     Patient presents with: Shortness of Breath   Rhonda Marshall is a 73 y.o. female.   Pt is a 73 yo female with pmhx significant for afib (on eliquis ), hyperthyroidism, RA, HTN, CHF, and CKD.  Pt was admitted from 6/19-6/25 for septic shock, chf, sepsis due to Enterococcus. There was a question of endocarditis, but pt unable to tolerate a TEE, so she was d/c with 6 weeks of IV abx which she completed on 8/2.  Pt had an EF of 30%.  Pt's daughter said she's been giving her lasix , but she is having more sob and is swelling more.  Pt's O2 sats were in the 80s at home today, so daughter brought her here.  Pt has had poor appetite for the past few days.  Daughter is also concerned with her right elbow remains swollen after a fall about a month ago.  Pt does have a hx of aspiration pna as well.           Prior to Admission medications   Medication Sig Start Date End Date Taking? Authorizing Provider  acetaminophen  (TYLENOL ) 500 MG tablet Take 500 mg by mouth as needed for moderate pain.    [provider]  ALPRAZolam  (XANAX ) 1 MG tablet Take 1 mg by mouth 4 (four) times daily as needed for anxiety.    [provider]  amiodarone  (PACERONE ) 200 MG tablet Take 0.5 tablets (100 mg total) by mouth daily. 10/23/23   Lavona Agent, MD  diclofenac  Sodium (VOLTAREN ) 1 % GEL Apply 2 g topically 4 (four) times daily as needed (pain).    [provider]  docusate sodium  (COLACE) 100 MG capsule Take 1 capsule (100 mg total) by mouth 2 (two) times daily as needed for mild constipation. 04/02/23   Sheikh, Omair Latif, DO  ELIQUIS  5 MG TABS tablet Take 1 tablet (5 mg total) by mouth 2 (two) times daily. 12/15/23   Lavona Agent, MD  furosemide  (LASIX ) 20 MG tablet Take 1 tablet (20 mg total) by mouth as needed. 01/20/24 02/19/24  Meng, Hao, PA  loperamide   (IMODIUM  A-D) 2 MG tablet Take 2 mg by mouth 4 (four) times daily as needed for diarrhea or loose stools.    [provider]  losartan  (COZAAR ) 25 MG tablet Take 1 tablet (25 mg total) by mouth daily. 02/10/24   Meng, Hao, PA  methimazole  (TAPAZOLE ) 5 MG tablet Take 0.5 tablets (2.5 mg total) by mouth daily. 11/20/23   Thapa, [place], MD  metoprolol  succinate (TOPROL -XL) 100 MG 24 hr tablet Take 100 mg by mouth daily. Take with or immediately following a meal.    [provider]  Multiple Vitamin (MULTIVITAMIN WITH MINERALS) TABS tablet Take 1 tablet by mouth daily.     [provider]  oxyCODONE -acetaminophen  (PERCOCET) 10-325 MG tablet Take 1 tablet by mouth every 4 (four) hours as needed for pain. 01/24/22   Brown, Blaine K, PA-C  sodium chloride  1 g tablet Take 1 tablet (1 g total) by mouth as needed. Patient taking differently: Take 1 g by mouth as needed (low sodium). 10/06/23   Meng, Hao, PA  Vitamin D , Ergocalciferol , (DRISDOL) 1.25 MG (50000 UNIT) CAPS capsule Take 50,000 Units by mouth once a week. 12/28/21   [provider]    Allergies: Clindamycin/lincomycin, Latex, and Plaquenil  [hydroxychloroquine ]    Review of Systems  Constitutional:  Positive for fatigue.  Respiratory:  Positive for cough and shortness of breath.   Musculoskeletal:        Right elbow pain  Neurological:  Positive for weakness.  All other systems reviewed and are negative.   Updated Vital Signs BP 100/81   Pulse 73   Temp (!) 96 F (35.6 C) (Rectal)   Resp 14   Ht 5' 2 (1.575 m)   Wt 53.1 kg   SpO2 (!) 69%   BMI 21.40 kg/m   Physical Exam Vitals and nursing note reviewed.  Constitutional:      Appearance: She is well-developed.  HENT:     Head: Normocephalic and atraumatic.     Mouth/Throat:     Mouth: Mucous membranes are moist.     Pharynx: Oropharynx is clear.  Eyes:     Extraocular Movements: Extraocular movements intact.     Pupils: Pupils are equal,  round, and reactive to light.  Cardiovascular:     Rate and Rhythm: Normal rate. Rhythm irregular.  Pulmonary:     Effort: Tachypnea present.     Breath sounds: Rhonchi present.  Abdominal:     General: Bowel sounds are normal.     Palpations: Abdomen is soft.  Musculoskeletal:     Cervical back: Normal range of motion and neck supple.     Right lower leg: Edema present.     Left lower leg: Edema present.     Comments: Right elbow swollen.  Decreased ROM. anasarca  Skin:    Capillary Refill: Capillary refill takes less than 2 seconds.  Neurological:     General: No focal deficit present.     Mental Status: She is alert and oriented to person, place, and time.  Psychiatric:        Mood and Affect: Mood normal.        Behavior: Behavior normal.     (all labs ordered are listed, but only abnormal results are displayed) Labs Reviewed  BASIC METABOLIC PANEL WITH GFR - Abnormal; Notable for the following components:      Result Value   Sodium 133 (*)    Chloride 87 (*)    Glucose, Bld 128 (*)    BUN 30 (*)    Creatinine, Ser 1.68 (*)    GFR, Estimated 32 (*)    Anion gap 16 (*)    All other components within normal limits  BRAIN NATRIURETIC PEPTIDE - Abnormal; Notable for the following components:   B Natriuretic Peptide 3,431.8 (*)    All other components within normal limits  CBC WITH DIFFERENTIAL/PLATELET - Abnormal; Notable for the following components:   WBC 3.8 (*)    MCH 25.7 (*)    MCHC 29.8 (*)    RDW 23.1 (*)    All other components within normal limits  HEPATIC FUNCTION PANEL - Abnormal; Notable for the following components:   Albumin  1.9 (*)    Alkaline Phosphatase 135 (*)    Total Bilirubin 1.4 (*)    Bilirubin, Direct 0.5 (*)    All other components within normal limits  BLOOD GAS, VENOUS - Abnormal; Notable for the following components:   pCO2, Ven 66 (*)    pO2, Ven <31 (*)    Bicarbonate 36.4 (*)    Acid-Base Excess 8.3 (*)    All other components  within normal limits  I-STAT CG4 LACTIC ACID, ED - Abnormal; Notable for the following components:   Lactic Acid, Venous 2.8 (*)  All other components within normal limits  TROPONIN I (HIGH SENSITIVITY) - Abnormal; Notable for the following components:   Troponin I (High Sensitivity) 18 (*)    All other components within normal limits  RESP PANEL BY RT-PCR (RSV, FLU A&B, COVID)  RVPGX2  CULTURE, BLOOD (ROUTINE X 2)  CULTURE, BLOOD (ROUTINE X 2)  MAGNESIUM   URINALYSIS, ROUTINE W REFLEX MICROSCOPIC  I-STAT CG4 LACTIC ACID, ED  TROPONIN I (HIGH SENSITIVITY)    EKG: EKG Interpretation Date/Time:  Sunday February 28 2024 17:56:34 EDT Ventricular Rate:  88 PR Interval:    QRS Duration:  159 QT Interval:  442 QTC Calculation: 535 R Axis:   200  Text Interpretation: Atrial fibrillation Nonspecific intraventricular conduction delay No significant change since last tracing Confirmed by Dean Clarity [phone]) on 02/28/2024 6:36:59 PM  Radiology: [facility] Elbow 2 Views Right Result Date: 02/28/2024 CLINICAL DATA:  Pain. EXAM: RIGHT ELBOW - 2 VIEW COMPARISON:  None Available. FINDINGS: There is no evidence of fracture, dislocation, or definite joint effusion. There is no evidence of arthropathy or other focal bone abnormality. No erosive change. Prominent generalized soft tissue edema. IMPRESSION: Prominent generalized soft tissue edema. No acute osseous abnormality. Electronically Signed   By: Andrea Gasman M.D.   On: 02/28/2024 18:44   DG Chest Port 1 View Result Date: 02/28/2024 CLINICAL DATA:  Shortness of breath. EXAM: PORTABLE CHEST 1 VIEW COMPARISON:  Most recent radiograph 01/07/2024 FINDINGS: Increased bilateral pleural effusions, partially loculated on the right. Associated bibasilar opacities. The heart is enlarged but stable. Slight vascular congestion. No pneumothorax. IMPRESSION: 1. Increased bilateral pleural effusions, partially loculated on the right. Associated bibasilar opacities.  2. Cardiomegaly with slight vascular congestion. Electronically Signed   By: Andrea Gasman M.D.   On: 02/28/2024 18:44     Procedures   Medications Ordered in the ED  piperacillin -tazobactam (ZOSYN ) IVPB 3.375 g (3.375 g Intravenous New Bag/Given 02/28/24 2012)  furosemide  (LASIX ) injection 20 mg (20 mg Intravenous Given 02/28/24 1925)                                    Medical Decision Making Amount and/or Complexity of Data Reviewed Labs: ordered. Radiology: ordered.  Risk Prescription drug management. Decision regarding hospitalization.   This patient presents to the ED for concern of sob, this involves an extensive number of treatment options, and is a complaint that carries with it a high risk of complications and morbidity.  The differential diagnosis includes chf exac, pna, covid/flu/rsv, renal failure, sepsis   Co morbidities that complicate the patient evaluation  afib (on eliquis ), hyperthyroidism, RA, HTN, CHF, and CKD   Additional history obtained:  Additional history obtained from epic chart review External records from outside source obtained and reviewed including daughter   Lab Tests:  I Ordered, and personally interpreted labs.  The pertinent results include:  lactic elevated at 2.8; cbc with wbc 3.8; bmp with acute on chronic renal failure (cr 1.68 compared to 1.13 on 6/26); bnp elevated at 3431.8; VBG with ph 7.35   Imaging Studies ordered:  I ordered imaging studies including cxr, right elbow  I independently visualized and interpreted imaging which showed  CXR: . Increased bilateral pleural effusions, partially loculated on the  right. Associated bibasilar opacities.  2. Cardiomegaly with slight vascular congestion.  R elbow: Prominent generalized soft tissue edema. No acute osseous  abnormality.   I agree with the radiologist  interpretation   Cardiac Monitoring:  The patient was maintained on a cardiac monitor.  I personally viewed  and interpreted the cardiac monitored which showed an underlying rhythm of: afib   Medicines ordered and prescription drug management:  I ordered medication including lasix   for chf  Reevaluation of the patient after these medicines showed that the patient improved I have reviewed the patients home medicines and have made adjustments as needed   Test Considered:  ct   Critical Interventions:  bipap   Consultations Obtained:  I requested consultation with the hospitalist (Dr. [name]),  and discussed lab and imaging findings as well as pertinent plan -she will admit   Problem List / ED Course:  CHF exac:  pt started on bipap.  She looks much more comfortable.  Pt's bp was sensitive to lasix  during last admission, so I gave her 20 mg iv.  Bp did drop to the upper 90s.   Hx of aspiration pna:  temp is sl low (family said this is normal for pt).  Lactic is sl high, so I will start her on zosyn .  I think SOB is primary due to fluid overload; however.   Reevaluation:  After the interventions noted above, I reevaluated the patient and found that they have :improved   Social Determinants of Health:  Lives at home with daughter   Dispostion:  After consideration of the diagnostic results and the patients response to treatment, I feel that the patent would benefit from admission.    CRITICAL CARE Performed by: Mliss Boyers   Total critical care time: 30 minutes  Critical care time was exclusive of separately billable procedures and treating other patients.  Critical care was necessary to treat or prevent imminent or life-threatening deterioration.  Critical care was time spent personally by me on the following activities: development of treatment plan with patient and/or surrogate as well as nursing, discussions with consultants, evaluation of patient's response to treatment, examination of patient, obtaining history from patient or surrogate, ordering and performing  treatments and interventions, ordering and review of laboratory studies, ordering and review of radiographic studies, pulse oximetry and re-evaluation of patient's condition.      Final diagnoses:  Acute on chronic congestive heart failure, unspecified heart failure type (HCC)  Anasarca  Acute renal failure superimposed on stage 3a chronic kidney disease, unspecified acute renal failure type [facility])    ED Discharge Orders     None          Boyers Mliss, MD 02/28/24 2026

## 2024-02-28 NOTE — H&P (Incomplete)
 History and Physical    Rhonda Marshall FMW:[id number] DOB: Sep 05, 1950 DOA: 02/28/2024  PCP: [name] LABOR, MD  Patient coming from: Home  Chief Complaint: Shortness of breath  HPI: Rhonda Marshall is a 73 y.o. female with medical history significant of chronic A-fib on Eliquis , HFrEF, hypertension, rheumatoid arthritis, anxiety, hyperthyroidism, chronic hyponatremia, CKD stage IIIa.  Patient was admitted to the hospital 6/19-6/27/2025 for CHF exacerbation, bilateral pleural effusions, severe sepsis/septic shock due to multifocal pneumonia/ aspiration pneumonia and Enterococcus [name] bacteremia, acute hypoxemic respiratory failure, acute on chronic hyponatremia, and AKI on CKD stage IIIa.  There was difficulty diuresing the patient due to persistent hypotension requiring vasopressor support.  ID had recommended TEE but cardiology felt that she was not a good candidate so she was treated with 6 weeks of IV antibiotics for empiric endocarditis coverage.  Debris in the esophagus was noted on CT and GI evaluated the patient and felt that she did not have esophageal food impaction but likely may have an esophageal dysmotility issue.  SLP was following and patient underwent modified barium swallow and was placed on dysphagia 2 versus dysphagia 3 diet.  GI had recommended outpatient endoscopy.  Patient presents to the ED today due to concern for shortness of breath, bilateral lower extremity edema, and confusion.  Family reported patient having a fall a month ago and since then having swelling of her right elbow.  In the ED, patient noted to be hypothermic with temperature 95.6 F.  She was tachypneic to the 30s on arrival and hypoxemic with SpO2 77% on room air.  She was placed on BiPAP.  SBP as low as 90s.  Labs notable for WBC count 3.8, sodium 133, chloride 87, creatinine 1.68 (baseline 1.0), blood cultures pending, BNP 3431, albumin  1.9, alk phos and T. bili minimally elevated and consistent with  previous labs, transaminases normal, troponin 18> 15, VBG with pH 7.35 and PCO2 66, COVID/influenza/RSV PCR negative, lactic acid 2.8> 2.5, UA not suggestive of infection.  Chest x-ray showing cardiomegaly with slight vascular congestion.  Showing increased bilateral pleural effusions, partially loculated on the right, and associated bibasilar opacities.  X-ray of right elbow showing prominent generalized soft tissue edema but no acute osseous abnormality.  Patient was given IV Lasix  20 mg and Zosyn .  ED Course: ***  Review of Systems:  ROS  Past Medical History:  Diagnosis Date  . Anxiety   . Atrial fibrillation (HCC)    newly diagnosed 10/2010  . Family history of adverse reaction to anesthesia    Sister's BP drops   . Hip fracture (HCC) 01/18/2022  . Hypertension   . Rheumatoid arthritis(714.0)   . Subclinical hyperthyroidism     Past Surgical History:  Procedure Laterality Date  . Arm surgery     fracture, right arm  . CARDIOVERSION N/A 08/10/2014   Procedure: CARDIOVERSION;  Surgeon: Leim VEAR Moose, MD;  Location: [facility] ENDOSCOPY;  Service: Cardiovascular;  Laterality: N/A;  . CARDIOVERSION N/A 01/22/2022   Procedure: CARDIOVERSION;  Surgeon: Hobart Powell BRAVO, MD;  Location: [facility] ENDOSCOPY;  Service: Cardiovascular;  Laterality: N/A;  . CATARACT EXTRACTION    . DIRECT LARYNGOSCOPY N/A 09/02/2019   Procedure: DIRECT LARYNGOSCOPY WITH BIOPSY;  Surgeon: Ethyl Lonni BRAVO, MD;  Location: [facility];  Service: ENT;  Laterality: N/A;  . HIP ARTHROPLASTY Right 01/19/2022   Procedure: ARTHROPLASTY BIPOLAR HIP (HEMIARTHROPLASTY);  Surgeon: Josefina Chew, MD;  Location: [facility] ORS;  Service: Orthopedics;  Laterality: Right;  . IR GASTROSTOMY TUBE  MOD SED  10/17/2019  . IR GASTROSTOMY TUBE REMOVAL  01/03/2020  . TEE WITHOUT CARDIOVERSION N/A 01/22/2022   Procedure: TRANSESOPHAGEAL ECHOCARDIOGRAM (TEE);  Surgeon: Hobart Powell BRAVO, MD;  Location: [facility] ENDOSCOPY;  Service:  Cardiovascular;  Laterality: N/A;  . TUBAL LIGATION       reports that she quit smoking about 4 years ago. Her smoking use included cigarettes. She started smoking about 58 years ago. She has a 13.6 pack-year smoking history. She has been exposed to tobacco smoke. She has never used smokeless tobacco. She reports that she does not currently use alcohol. She reports that she does not use drugs.  Allergies  Allergen Reactions  . Clindamycin/Lincomycin Rash  . Latex Rash  . 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 Heart attack Mother 93       pacemaker age 20  . Hypothyroidism Mother   . Cancer Father        colon and prostate  . Hypothyroidism Sister   . Cancer Sister   . Diabetes Brother   . CAD Brother 70  . Cancer Brother        Renal    Prior to Admission medications   Medication Sig Start Date End Date Taking? Authorizing Provider  acetaminophen  (TYLENOL ) 500 MG tablet Take 500 mg by mouth as needed for moderate pain.    [provider]  ALPRAZolam  (XANAX ) 1 MG tablet Take 1 mg by mouth 4 (four) times daily as needed for anxiety.    [provider]  amiodarone  (PACERONE ) 200 MG tablet Take 0.5 tablets (100 mg total) by mouth daily. 10/23/23   Lavona Agent, MD  diclofenac  Sodium (VOLTAREN ) 1 % GEL Apply 2 g topically 4 (four) times daily as needed (pain).    [provider]  docusate sodium  (COLACE) 100 MG capsule Take 1 capsule (100 mg total) by mouth 2 (two) times daily as needed for mild constipation. 04/02/23   Sheikh, Omair Latif, DO  ELIQUIS  5 MG TABS tablet Take 1 tablet (5 mg total) by mouth 2 (two) times daily. 12/15/23   Lavona Agent, MD  furosemide  (LASIX ) 20 MG tablet Take 1 tablet (20 mg total) by mouth as needed. 01/20/24 02/19/24  Meng, Hao, PA  loperamide  (IMODIUM  A-D) 2 MG tablet Take 2 mg by mouth 4 (four) times daily as needed for diarrhea or  loose stools.    [provider]  losartan  (COZAAR ) 25 MG tablet Take 1 tablet (25 mg total) by mouth daily. 02/10/24   Meng, Hao, PA  methimazole  (TAPAZOLE ) 5 MG tablet Take 0.5 tablets (2.5 mg total) by mouth daily. 11/20/23   Thapa, [place], MD  metoprolol  succinate (TOPROL -XL) 100 MG 24 hr tablet Take 100 mg by mouth daily. Take with or immediately following a meal.    [provider]  Multiple Vitamin (MULTIVITAMIN WITH MINERALS) TABS tablet Take 1 tablet by mouth daily.     [provider]  oxyCODONE -acetaminophen  (PERCOCET) 10-325 MG tablet Take 1 tablet by mouth every 4 (four) hours as needed for pain. 01/24/22   Brown, Blaine K, PA-C  sodium chloride  1 g tablet Take 1 tablet (1 g total) by mouth as needed. Patient taking differently: Take 1 g by mouth as needed (low sodium). 10/06/23   Meng, Hao, PA  Vitamin D , Ergocalciferol , (DRISDOL) 1.25 MG (50000 UNIT) CAPS capsule Take 50,000 Units by mouth once  a week. 12/28/21   [provider]    Physical Exam: Vitals:   02/28/24 2135 02/28/24 2202 02/28/24 2230 02/28/24 2235  BP: 110/84 119/86 126/85   Pulse: 68 67 61   Resp: 11 20 11    Temp:    (!) 95.6 F (35.3 C)  TempSrc:    Rectal  SpO2: 100% 100% 100%   Weight:      Height:        Physical Exam  Labs on Admission: I have personally reviewed following labs and imaging studies  CBC: Recent Labs  Lab 02/28/24 1825  WBC 3.8*  NEUTROABS 2.7  HGB 12.9  HCT 43.3  MCV 86.4  PLT 180   Basic Metabolic Panel: Recent Labs  Lab 02/28/24 1825  NA 133*  K 4.3  CL 87*  CO2 30  GLUCOSE 128*  BUN 30*  CREATININE 1.68*  CALCIUM 9.6  MG 2.1   GFR: Estimated Creatinine Clearance: 23.2 mL/min (A) (by C-G formula based on SCr of 1.68 mg/dL (H)). Liver Function Tests: Recent Labs  Lab 02/28/24 1825  AST 31  ALT 14  ALKPHOS 135*  BILITOT 1.4*  PROT 7.0  ALBUMIN  1.9*   No results for input(s): LIPASE, AMYLASE in the last 168 hours. No  results for input(s): AMMONIA in the last 168 hours. Coagulation Profile: No results for input(s): INR, PROTIME in the last 168 hours. Cardiac Enzymes: No results for input(s): CKTOTAL, CKMB, CKMBINDEX, TROPONINI in the last 168 hours. BNP (last 3 results) No results for input(s): PROBNP in the last 8760 hours. HbA1C: No results for input(s): HGBA1C in the last 72 hours. CBG: No results for input(s): GLUCAP in the last 168 hours. Lipid Profile: No results for input(s): CHOL, HDL, LDLCALC, TRIG, CHOLHDL, LDLDIRECT in the last 72 hours. Thyroid  Function Tests: No results for input(s): TSH, T4TOTAL, FREET4, T3FREE, THYROIDAB in the last 72 hours. Anemia Panel: No results for input(s): VITAMINB12, FOLATE, FERRITIN, TIBC, IRON, RETICCTPCT in the last 72 hours. Urine analysis:    Component Value Date/Time   COLORURINE YELLOW 02/28/2024 2106   APPEARANCEUR HAZY (A) 02/28/2024 2106   LABSPEC 1.013 02/28/2024 2106   PHURINE 5.0 02/28/2024 2106   GLUCOSEU NEGATIVE 02/28/2024 2106   HGBUR NEGATIVE 02/28/2024 2106   BILIRUBINUR NEGATIVE 02/28/2024 2106   KETONESUR NEGATIVE 02/28/2024 2106   PROTEINUR 100 (A) 02/28/2024 2106   UROBILINOGEN 0.2 11/05/2010 1220   NITRITE NEGATIVE 02/28/2024 2106   LEUKOCYTESUR NEGATIVE 02/28/2024 2106    Radiological Exams on Admission: DG Elbow 2 Views Right Result Date: 02/28/2024 CLINICAL DATA:  Pain. EXAM: RIGHT ELBOW - 2 VIEW COMPARISON:  None Available. FINDINGS: There is no evidence of fracture, dislocation, or definite joint effusion. There is no evidence of arthropathy or other focal bone abnormality. No erosive change. Prominent generalized soft tissue edema. IMPRESSION: Prominent generalized soft tissue edema. No acute osseous abnormality. Electronically Signed   By: Andrea Gasman M.D.   On: 02/28/2024 18:44   DG Chest Port 1 View Result Date: 02/28/2024 CLINICAL DATA:  Shortness of breath.  EXAM: PORTABLE CHEST 1 VIEW COMPARISON:  Most recent radiograph 01/07/2024 FINDINGS: Increased bilateral pleural effusions, partially loculated on the right. Associated bibasilar opacities. The heart is enlarged but stable. Slight vascular congestion. No pneumothorax. IMPRESSION: 1. Increased bilateral pleural effusions, partially loculated on the right. Associated bibasilar opacities. 2. Cardiomegaly with slight vascular congestion. Electronically Signed   By: Andrea Gasman M.D.   On: 02/28/2024 18:44    EKG: Independently  reviewed.  Rate controlled A-fib, QTc 535, LBBB is not new.  Assessment and Plan  Acute on chronic HFrEF Acute hypoxemic respiratory failure Echo done in June 2025 showing EF 30 to 35%, global hypokinesis of the left ventricle, grade 2 diastolic dysfunction, moderate biatrial dilation, moderate mitral regurgitation, moderate tricuspid regurgitation.  Patient is presenting with volume overload.  BNP 3431.  Chest x-ray showing slight vascular congestion and increased bilateral pleural effusions.  Patient presented with respiratory distress and hypoxemia (SpO2 77% on room air).  Given soft blood pressure, she was given IV Lasix  20 mg x 1 only.  During her previous hospitalization in June, it was difficult to diurese the patient due to persistent hypotension requiring vasopressor support.  Will hold off giving any additional doses of IV Lasix  tonight.  Patient is currently stable on BiPAP.  Monitor intake and output, daily weights.  Consult cardiology in the morning.  Possible sepsis Patient hypothermic in the ED and was placed on Bair hugger.  Tachycardic on arrival.  Labs showing mild leukopenia and mild lactic acidosis.  chronic A-fib on Eliquis , HFrEF, hypertension, rheumatoid arthritis, anxiety, hyperthyroidism, chronic hyponatremia, CKD stage IIIa.  Patient was admitted to the hospital 6/19-6/27/2025 for CHF exacerbation, bilateral pleural effusions, severe sepsis/septic shock  due to multifocal pneumonia/ aspiration pneumonia and Enterococcus [name] bacteremia, acute hypoxemic respiratory failure, acute on chronic hyponatremia, and AKI on CKD stage IIIa.  There was difficulty diuresing the patient due to persistent hypotension requiring vasopressor support.  ID had recommended TEE but cardiology felt that she was not a good candidate so she was treated with 6 weeks of IV antibiotics for empiric endocarditis coverage.  Debris in the esophagus was noted on CT and GI evaluated the patient and felt that she did not have esophageal food impaction but likely may have an esophageal dysmotility issue.  SLP was following and patient underwent modified barium swallow and was placed on dysphagia 2 versus dysphagia 3 diet.  GI had recommended outpatient endoscopy.  Patient presents to the ED today due to concern for shortness of breath, bilateral lower extremity edema, and confusion.  Family reported patient having a fall a month ago and since then having swelling of her right elbow.  In the ED, patient noted to be hypothermic with temperature 95.6 F.  She was tachypneic to the 30s on arrival and hypoxemic with SpO2 77% on room air.  She was placed on BiPAP.  SBP as low as 90s.  Labs notable for WBC count 3.8, sodium 133, chloride 87, creatinine 1.68 (baseline 1.0), blood cultures pending, albumin  1.9, alk phos and T. bili minimally elevated and consistent with previous labs, transaminases normal, troponin 18> 15, VBG with pH 7.35 and PCO2 66, COVID/influenza/RSV PCR negative, lactic acid 2.8> 2.5, UA not suggestive of infection.  Chest x-ray showing cardiomegaly with slight vascular congestion.  Showing increased bilateral pleural effusions, partially loculated on the right, and associated bibasilar opacities.  X-ray of right elbow showing prominent generalized soft tissue edema but no acute osseous abnormality.  Patient was given IV Lasix  20 mg and Zosyn .    DVT prophylaxis: {Blank  single:19197::Lovenox ,SQ Heparin ,IV heparin  gtt,Xarelto ,Eliquis ,Coumadin,SCDs,***} Code Status: {Blank single:19197::Full Code,DNR,DNR/DNI,Comfort Care,***} Family Communication: ***  Consults called: ***  Level of care: {Blank single:19197::Med-Surg,Telemetry bed,Progressive Care Unit,Step Down Unit} Admission status: *** Time Spent: 75+ minutes***  Editha Ram MD [facility]  If 7PM-7AM, please contact night-coverage [email or web address]  02/28/2024, 11:26 PM

## 2024-02-28 NOTE — ED Triage Notes (Signed)
 Patient was recently admitted for sepsis heart infection was on IV antibotics at home via PICC line finished that 2-3 weeks ago.  Family reports sats have been 88-94% for the past week btu dropped to low 80s today and having increased confusion.  High oxygen  level in triage 77% Patient is in end stage CHF and family reports patient is also leaking fluids from her legs.

## 2024-02-28 NOTE — H&P (Signed)
 History and Physical    Rhonda Marshall FMW:[id number] DOB: 14-Feb-1951 DOA: 02/28/2024  PCP: [name] LABOR, MD  Patient coming from: Home  Chief Complaint: Shortness of breath  HPI: Rhonda Marshall is a 73 y.o. female with medical history significant of chronic A-fib on Eliquis , HFrEF, hypertension, rheumatoid arthritis, anxiety, hyperthyroidism, chronic hyponatremia, CKD stage IIIa.  Patient was admitted to the hospital 6/19-6/27/2025 for CHF exacerbation, bilateral pleural effusions, severe sepsis/septic shock due to multifocal pneumonia/ aspiration pneumonia and Enterococcus [name] bacteremia, acute hypoxemic respiratory failure, acute on chronic hyponatremia, and AKI on CKD stage IIIa.  There was difficulty diuresing the patient due to persistent hypotension requiring vasopressor support.  ID had recommended TEE but cardiology felt that she was not a good candidate so she was treated with 6 weeks of IV antibiotics for empiric endocarditis coverage.  Debris in the esophagus was noted on CT and GI evaluated the patient and felt that she did not have esophageal food impaction but likely may have an esophageal dysmotility issue.  SLP was following and patient underwent modified barium swallow and was placed on dysphagia 2 versus dysphagia 3 diet.  GI had recommended outpatient endoscopy.  Patient presents to the ED today due to concern for shortness of breath, bilateral lower extremity edema, confusion, and swelling of her right elbow since after a fall a month ago.  In the ED, patient noted to be hypothermic with temperature 95.6 F.  She was tachypneic to the 30s on arrival and hypoxemic with SpO2 77% on room air.  She was placed on BiPAP.  SBP as low as 90s.  Labs notable for WBC count 3.8, sodium 133, chloride 87, creatinine 1.68 (baseline 1.0), blood cultures pending, BNP 3431, albumin  1.9, alk phos and T. bili minimally elevated and consistent with previous labs, transaminases normal, troponin  18> 15, VBG with pH 7.35 and PCO2 66, COVID/influenza/RSV PCR negative, lactic acid 2.8> 2.5, UA not suggestive of infection.  Chest x-ray showing cardiomegaly with slight vascular congestion.  Showing increased bilateral pleural effusions, partially loculated on the right, and associated bibasilar opacities.  X-ray of right elbow showing prominent generalized soft tissue edema but no acute osseous abnormality.  No head imaging done.  Patient was given IV Lasix  20 mg and Zosyn .  TRH called to admit.  At the time of my evaluation, patient noted to be obtunded.  She is not even responding to sternal rubs.  On BiPAP and maintaining sats in the mid 90s.  Slightly bradycardic with heart rate in the high 50s to low 60s, blood pressure 119/79.  No family available at this time.  Review of Systems:  Review of Systems  Reason unable to perform ROS: AMS.    Past Medical History:  Diagnosis Date   Anxiety    Atrial fibrillation (HCC)    newly diagnosed 10/2010   Family history of adverse reaction to anesthesia    Sister's BP drops    Hip fracture (HCC) 01/18/2022   Hypertension    Rheumatoid arthritis(714.0)    Subclinical hyperthyroidism     Past Surgical History:  Procedure Laterality Date   Arm surgery     fracture, right arm   CARDIOVERSION N/A 08/10/2014   Procedure: CARDIOVERSION;  Surgeon: Leim VEAR Moose, MD;  Location: [facility] ENDOSCOPY;  Service: Cardiovascular;  Laterality: N/A;   CARDIOVERSION N/A 01/22/2022   Procedure: CARDIOVERSION;  Surgeon: Hobart Powell BRAVO, MD;  Location: [facility] ENDOSCOPY;  Service: Cardiovascular;  Laterality: N/A;   CATARACT EXTRACTION  DIRECT LARYNGOSCOPY N/A 09/02/2019   Procedure: DIRECT LARYNGOSCOPY WITH BIOPSY;  Surgeon: Ethyl Lonni BRAVO, MD;  Location: [facility];  Service: ENT;  Laterality: N/A;   HIP ARTHROPLASTY Right 01/19/2022   Procedure: ARTHROPLASTY BIPOLAR HIP (HEMIARTHROPLASTY);  Surgeon: Josefina Chew, MD;  Location: [facility] ORS;   Service: Orthopedics;  Laterality: Right;   IR GASTROSTOMY TUBE MOD SED  10/17/2019   IR GASTROSTOMY TUBE REMOVAL  01/03/2020   TEE WITHOUT CARDIOVERSION N/A 01/22/2022   Procedure: TRANSESOPHAGEAL ECHOCARDIOGRAM (TEE);  Surgeon: Hobart Powell BRAVO, MD;  Location: [facility] ENDOSCOPY;  Service: Cardiovascular;  Laterality: N/A;   TUBAL LIGATION       reports that she quit smoking about 4 years ago. Her smoking use included cigarettes. She started smoking about 58 years ago. She has a 13.6 pack-year smoking history. She has been exposed to tobacco smoke. She has never used smokeless tobacco. She reports that she does not currently use alcohol. She reports that she does not use drugs.  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 Authorizing Provider  acetaminophen  (TYLENOL ) 500 MG tablet Take 500 mg by mouth as needed for moderate pain.    [provider]  ALPRAZolam  (XANAX ) 1 MG tablet Take 1 mg by mouth 4 (four) times daily as needed for anxiety.    [provider]  amiodarone  (PACERONE ) 200 MG tablet Take 0.5 tablets (100 mg total) by mouth daily. 10/23/23   Lavona Agent, MD  diclofenac  Sodium (VOLTAREN ) 1 % GEL Apply 2 g topically 4 (four) times daily as needed (pain).    [provider]  docusate sodium  (COLACE) 100 MG capsule Take 1 capsule (100 mg total) by mouth 2 (two) times daily as needed for mild constipation. 04/02/23   Sheikh, Omair Latif, DO  ELIQUIS  5 MG TABS tablet Take 1 tablet (5 mg  total) by mouth 2 (two) times daily. 12/15/23   Lavona Agent, MD  furosemide  (LASIX ) 20 MG tablet Take 1 tablet (20 mg total) by mouth as needed. 01/20/24 02/19/24  Meng, Hao, PA  loperamide  (IMODIUM  A-D) 2 MG tablet Take 2 mg by mouth 4 (four) times daily as needed for diarrhea or loose stools.    [provider]  losartan  (COZAAR ) 25 MG tablet Take 1 tablet (25 mg total) by mouth daily. 02/10/24   Meng, Hao, PA  methimazole  (TAPAZOLE ) 5 MG tablet Take 0.5 tablets (2.5 mg total) by mouth daily. 11/20/23   Thapa, [place], MD  metoprolol  succinate (TOPROL -XL) 100 MG 24 hr tablet Take 100 mg by mouth daily. Take with or immediately following a meal.    [provider]  Multiple Vitamin (MULTIVITAMIN WITH MINERALS) TABS tablet Take 1 tablet by mouth daily.     [provider]  oxyCODONE -acetaminophen  (PERCOCET) 10-325 MG tablet Take 1 tablet by mouth every 4 (four) hours  as needed for pain. 01/24/22   Brown, Blaine K, PA-C  sodium chloride  1 g tablet Take 1 tablet (1 g total) by mouth as needed. Patient taking differently: Take 1 g by mouth as needed (low sodium). 10/06/23   Meng, Hao, PA  Vitamin D , Ergocalciferol , (DRISDOL) 1.25 MG (50000 UNIT) CAPS capsule Take 50,000 Units by mouth once a week. 12/28/21   [provider]    Physical Exam: Vitals:   02/28/24 2135 02/28/24 2202 02/28/24 2230 02/28/24 2235  BP: 110/84 119/86 126/85   Pulse: 68 67 61   Resp: 11 20 11    Temp:    (!) 95.6 F (35.3 C)  TempSrc:    Rectal  SpO2: 100% 100% 100%   Weight:      Height:        Physical Exam Vitals reviewed.  Constitutional:      Appearance: She is ill-appearing.  HENT:     Head: Normocephalic.  Cardiovascular:     Rate and Rhythm: Bradycardia present. Rhythm irregular.     Pulses: Normal pulses.  Pulmonary:     Effort: Pulmonary effort is normal. No respiratory distress.     Breath sounds: Rales present.  Abdominal:     General: Bowel sounds are normal. There is  no distension.     Palpations: Abdomen is soft.     Tenderness: There is no abdominal tenderness. There is no guarding.  Musculoskeletal:     Right lower leg: Edema present.     Left lower leg: Edema present.     Comments: Significant pitting edema of bilateral lower and upper extremities  Skin:    General: Skin is warm and dry.  Neurological:     Comments: Obtunded     Labs on Admission: I have personally reviewed following labs and imaging studies  CBC: Recent Labs  Lab 02/28/24 1825  WBC 3.8*  NEUTROABS 2.7  HGB 12.9  HCT 43.3  MCV 86.4  PLT 180   Basic Metabolic Panel: Recent Labs  Lab 02/28/24 1825  NA 133*  K 4.3  CL 87*  CO2 30  GLUCOSE 128*  BUN 30*  CREATININE 1.68*  CALCIUM 9.6  MG 2.1   GFR: Estimated Creatinine Clearance: 23.2 mL/min (A) (by C-G formula based on SCr of 1.68 mg/dL (H)). Liver Function Tests: Recent Labs  Lab 02/28/24 1825  AST 31  ALT 14  ALKPHOS 135*  BILITOT 1.4*  PROT 7.0  ALBUMIN  1.9*   No results for input(s): LIPASE, AMYLASE in the last 168 hours. No results for input(s): AMMONIA in the last 168 hours. Coagulation Profile: No results for input(s): INR, PROTIME in the last 168 hours. Cardiac Enzymes: No results for input(s): CKTOTAL, CKMB, CKMBINDEX, TROPONINI in the last 168 hours. BNP (last 3 results) No results for input(s): PROBNP in the last 8760 hours. HbA1C: No results for input(s): HGBA1C in the last 72 hours. CBG: No results for input(s): GLUCAP in the last 168 hours. Lipid Profile: No results for input(s): CHOL, HDL, LDLCALC, TRIG, CHOLHDL, LDLDIRECT in the last 72 hours. Thyroid  Function Tests: No results for input(s): TSH, T4TOTAL, FREET4, T3FREE, THYROIDAB in the last 72 hours. Anemia Panel: No results for input(s): VITAMINB12, FOLATE, FERRITIN, TIBC, IRON, RETICCTPCT in the last 72 hours. Urine analysis:    Component Value Date/Time    COLORURINE YELLOW 02/28/2024 2106   APPEARANCEUR HAZY (A) 02/28/2024 2106   LABSPEC 1.013 02/28/2024 2106   PHURINE 5.0 02/28/2024 2106   GLUCOSEU NEGATIVE 02/28/2024  2106   HGBUR NEGATIVE 02/28/2024 2106   BILIRUBINUR NEGATIVE 02/28/2024 2106   KETONESUR NEGATIVE 02/28/2024 2106   PROTEINUR 100 (A) 02/28/2024 2106   UROBILINOGEN 0.2 11/05/2010 1220   NITRITE NEGATIVE 02/28/2024 2106   LEUKOCYTESUR NEGATIVE 02/28/2024 2106    Radiological Exams on Admission: DG Elbow 2 Views Right Result Date: 02/28/2024 CLINICAL DATA:  Pain. EXAM: RIGHT ELBOW - 2 VIEW COMPARISON:  None Available. FINDINGS: There is no evidence of fracture, dislocation, or definite joint effusion. There is no evidence of arthropathy or other focal bone abnormality. No erosive change. Prominent generalized soft tissue edema. IMPRESSION: Prominent generalized soft tissue edema. No acute osseous abnormality. Electronically Signed   By: Andrea Gasman M.D.   On: 02/28/2024 18:44   DG Chest Port 1 View Result Date: 02/28/2024 CLINICAL DATA:  Shortness of breath. EXAM: PORTABLE CHEST 1 VIEW COMPARISON:  Most recent radiograph 01/07/2024 FINDINGS: Increased bilateral pleural effusions, partially loculated on the right. Associated bibasilar opacities. The heart is enlarged but stable. Slight vascular congestion. No pneumothorax. IMPRESSION: 1. Increased bilateral pleural effusions, partially loculated on the right. Associated bibasilar opacities. 2. Cardiomegaly with slight vascular congestion. Electronically Signed   By: Andrea Gasman M.D.   On: 02/28/2024 18:44    EKG: Independently reviewed.  Rate controlled A-fib, QTc 535, LBBB is not new.  Assessment and Plan  Acute on chronic HFrEF Echo done in June 2025 showing EF 30 to 35%, global hypokinesis of the left ventricle, grade 2 diastolic dysfunction, moderate biatrial dilation, moderate mitral regurgitation, moderate tricuspid regurgitation.  Patient is presenting with  significant volume overload/anasarca and acute hypoxemic respiratory failure requiring BiPAP.  BNP 3431.  Chest x-ray showing slight vascular congestion and increased bilateral pleural effusions.  Given soft blood pressure, she was given IV Lasix  20 mg x 1 in the ED.  During her previous hospitalization in June, it was difficult to diurese the patient due to persistent hypotension requiring vasopressor support.  Monitor intake and output, daily weights.  Critical care consulted (see below) and will need cardiology consultation in the morning.  Severe sepsis secondary to bibasilar pneumonia Patient hypothermic requiring Bair hugger.  Tachypneic on arrival.  Labs showing leukopenia and lactic acidosis.  Blood pressure initially soft but now improved.  Most recent blood pressure 119/79 and no worsening lactic acidosis.  UA not suggestive of infection.  Chest x-ray showing bibasilar opacities and pleural effusion appears partially loculated on the right.  COVID/influenza/RSV PCR negative.  She was treated for aspiration pneumonia during her previous hospitalization in June.  Continue broad-spectrum antibiotics including vancomycin  and Zosyn .  MRSA PCR screen.  Strep pneumo/Legionella urinary antigens.  Check procalcitonin level and trend WBC count.  Follow-up blood cultures.  Acute hypoxemic respiratory failure Secondary to problems listed above. Patient presented with respiratory distress and hypoxemia (SpO2 77% on room air).  Currently maintaining sats in the mid 90s on BiPAP.  Acute encephalopathy Patient is currently obtunded, not even responding to sternal rubs.  Thankfully she is maintaining her sats in the mid 90s on BiPAP.  RT unable to get ABG due to patient having significant edema/anasarca.  Urgent VBG ordered.  Also stat CT head ordered.  Given patient's current mental state and high risk of aspiration, she may not be able to tolerate BiPAP much longer and may need intubation.  I have consulted  critical care.  AKI on CKD stage IIIa Likely cardiorenal in etiology in the setting of acutely decompensated heart failure.  Creatinine 1.68 (baseline  1.0).  Patient was given IV Lasix  as above.  Repeat labs in the morning to check renal function and avoid nephrotoxic agents.  Chronic A-fib Currently rate controlled.  Hold anticoagulation until CT head is done.  Chronic hyponatremia Sodium at baseline, monitor labs.  Hypertension: Avoid antihypertensives at this time as above. Rheumatoid arthritis Hyperthyroidism Patient is currently obtunded/on BiPAP/NPO.  Holding p.o. meds.  DVT prophylaxis: Hold anticoagulation until CT head is done. Code Status: Full code by default.  Patient does not have capacity for decision-making, no surrogate or prior directive available.  Per review of chart, patient was listed as full code during her previous hospitalizations. Family Communication: No family available at this time. Consults called: PCCM (Dr. Layman) Level of care: Step Down Unit Admission status: It is my clinical opinion that admission to INPATIENT is reasonable and necessary because of the expectation that this patient will require hospital care that crosses at least 2 midnights to treat this condition based on the medical complexity of the problems presented.  Given the aforementioned information, the predictability of an adverse outcome is felt to be significant.  Editha Ram MD [facility]  If 7PM-7AM, please contact night-coverage [email or web address]  02/28/2024, 11:26 PM

## 2024-02-28 NOTE — ED Notes (Signed)
 RN and provider notified of critical lab.

## 2024-02-28 NOTE — ED Notes (Addendum)
Pt placed on Bair Hugger at this time.

## 2024-02-29 ENCOUNTER — Inpatient Hospital Stay (HOSPITAL_COMMUNITY)

## 2024-02-29 ENCOUNTER — Other Ambulatory Visit: Payer: Self-pay

## 2024-02-29 ENCOUNTER — Ambulatory Visit: Attending: Cardiology | Admitting: Cardiology

## 2024-02-29 DIAGNOSIS — J9602 Acute respiratory failure with hypercapnia: Secondary | ICD-10-CM

## 2024-02-29 DIAGNOSIS — I5022 Chronic systolic (congestive) heart failure: Secondary | ICD-10-CM

## 2024-02-29 DIAGNOSIS — J90 Pleural effusion, not elsewhere classified: Secondary | ICD-10-CM | POA: Diagnosis not present

## 2024-02-29 DIAGNOSIS — R7881 Bacteremia: Secondary | ICD-10-CM

## 2024-02-29 DIAGNOSIS — G9340 Encephalopathy, unspecified: Secondary | ICD-10-CM

## 2024-02-29 DIAGNOSIS — I4819 Other persistent atrial fibrillation: Secondary | ICD-10-CM

## 2024-02-29 DIAGNOSIS — J189 Pneumonia, unspecified organism: Secondary | ICD-10-CM | POA: Diagnosis not present

## 2024-02-29 DIAGNOSIS — N179 Acute kidney failure, unspecified: Secondary | ICD-10-CM

## 2024-02-29 DIAGNOSIS — I959 Hypotension, unspecified: Secondary | ICD-10-CM

## 2024-02-29 DIAGNOSIS — I5043 Acute on chronic combined systolic (congestive) and diastolic (congestive) heart failure: Secondary | ICD-10-CM

## 2024-02-29 DIAGNOSIS — I482 Chronic atrial fibrillation, unspecified: Secondary | ICD-10-CM | POA: Diagnosis not present

## 2024-02-29 DIAGNOSIS — J9601 Acute respiratory failure with hypoxia: Secondary | ICD-10-CM

## 2024-02-29 DIAGNOSIS — G9341 Metabolic encephalopathy: Secondary | ICD-10-CM

## 2024-02-29 DIAGNOSIS — I509 Heart failure, unspecified: Secondary | ICD-10-CM

## 2024-02-29 DIAGNOSIS — E871 Hypo-osmolality and hyponatremia: Secondary | ICD-10-CM

## 2024-02-29 DIAGNOSIS — I5023 Acute on chronic systolic (congestive) heart failure: Secondary | ICD-10-CM | POA: Diagnosis not present

## 2024-02-29 DIAGNOSIS — D72819 Decreased white blood cell count, unspecified: Secondary | ICD-10-CM

## 2024-02-29 DIAGNOSIS — A419 Sepsis, unspecified organism: Secondary | ICD-10-CM

## 2024-02-29 DIAGNOSIS — R6520 Severe sepsis without septic shock: Secondary | ICD-10-CM

## 2024-02-29 DIAGNOSIS — R579 Shock, unspecified: Secondary | ICD-10-CM

## 2024-02-29 LAB — PROCALCITONIN: Procalcitonin: <0.1 ng/mL

## 2024-02-29 LAB — ECHOCARDIOGRAM LIMITED
Calc EF: 33.5 %
Height: 62 in
S' Lateral: 3.4 cm
Single Plane A2C EF: 37.1 %
Single Plane A4C EF: 29.8 %
Weight: 2176.38 [oz_av]

## 2024-02-29 LAB — COMPREHENSIVE METABOLIC PANEL WITH GFR
ALT: 12 U/L (ref 0–44)
AST: 22 U/L (ref 15–41)
Albumin: 1.6 g/dL — ABNORMAL LOW (ref 3.5–5.0)
Alkaline Phosphatase: 105 U/L (ref 38–126)
Anion gap: 12 (ref 5–15)
BUN: 30 mg/dL — ABNORMAL HIGH (ref 8–23)
CO2: 32 mmol/L (ref 22–32)
Calcium: 9 mg/dL (ref 8.9–10.3)
Chloride: 90 mmol/L — ABNORMAL LOW (ref 98–111)
Creatinine, Ser: 1.65 mg/dL — ABNORMAL HIGH (ref 0.44–1.00)
GFR, Estimated: 32 mL/min — ABNORMAL LOW (ref >60)
Glucose, Bld: 96 mg/dL (ref 70–99)
Potassium: 3.8 mmol/L (ref 3.5–5.1)
Sodium: 134 mmol/L — ABNORMAL LOW (ref 135–145)
Total Bilirubin: 1.1 mg/dL (ref 0.0–1.2)
Total Protein: 5.4 g/dL — ABNORMAL LOW (ref 6.5–8.1)

## 2024-02-29 LAB — CBC
HCT: 39.7 % (ref 36.0–46.0)
Hemoglobin: 12 g/dL (ref 12.0–15.0)
MCH: 26.4 pg (ref 26.0–34.0)
MCHC: 30.2 g/dL (ref 30.0–36.0)
MCV: 87.3 fL (ref 80.0–100.0)
Platelets: 150 K/uL (ref 150–400)
RBC: 4.55 MIL/uL (ref 3.87–5.11)
RDW: 22.6 % — ABNORMAL HIGH (ref 11.5–15.5)
WBC: 3.6 K/uL — ABNORMAL LOW (ref 4.0–10.5)
nRBC: 0 % (ref 0.0–0.2)

## 2024-02-29 LAB — MRSA NEXT GEN BY PCR, NASAL: MRSA by PCR Next Gen: NOT DETECTED

## 2024-02-29 LAB — BLOOD GAS, VENOUS
Acid-Base Excess: 10.7 mmol/L — ABNORMAL HIGH (ref 0.0–2.0)
Bicarbonate: 38.5 mmol/L — ABNORMAL HIGH (ref 20.0–28.0)
O2 Saturation: 62.1 %
Patient temperature: 37
pCO2, Ven: 65 mmHg — ABNORMAL HIGH (ref 44–60)
pH, Ven: 7.38 (ref 7.25–7.43)
pO2, Ven: 41 mmHg (ref 32–45)

## 2024-02-29 LAB — TSH: TSH: 11.667 u[IU]/mL — ABNORMAL HIGH (ref 0.350–4.500)

## 2024-02-29 LAB — T4, FREE: Free T4: 0.86 ng/dL (ref 0.61–1.12)

## 2024-02-29 LAB — COOXEMETRY PANEL
Carboxyhemoglobin: 1.7 % — ABNORMAL HIGH (ref 0.5–1.5)
Methemoglobin: <0.7 % (ref 0.0–1.5)
O2 Saturation: 72.5 %
Total hemoglobin: 11.8 g/dL — ABNORMAL LOW (ref 12.0–16.0)

## 2024-02-29 LAB — HEPARIN LEVEL (UNFRACTIONATED): Heparin Unfractionated: >1.1 [IU]/mL — ABNORMAL HIGH (ref 0.30–0.70)

## 2024-02-29 LAB — STREP PNEUMONIAE URINARY ANTIGEN: Strep Pneumo Urinary Antigen: NEGATIVE

## 2024-02-29 LAB — LACTIC ACID, PLASMA: Lactic Acid, Venous: 1.3 mmol/L (ref 0.5–1.9)

## 2024-02-29 MED ORDER — ORAL CARE MOUTH RINSE
15.0000 mL | OROMUCOSAL | Status: DC
  Administered 2024-02-29 – 2024-03-08 (×33): 15 mL via OROMUCOSAL

## 2024-02-29 MED ORDER — CHLORHEXIDINE GLUCONATE CLOTH 2 % EX PADS
6.0000 | MEDICATED_PAD | Freq: Every day | CUTANEOUS | Status: DC
  Administered 2024-02-29 – 2024-03-08 (×9): 6 via TOPICAL

## 2024-02-29 MED ORDER — ORAL CARE MOUTH RINSE: 15.0000 mL | OROMUCOSAL | Status: DC | PRN

## 2024-02-29 MED ORDER — ACETAMINOPHEN 325 MG PO TABS
650.0000 mg | ORAL_TABLET | Freq: Four times a day (QID) | ORAL | Status: DC | PRN
  Administered 2024-02-29: 650 mg via ORAL
  Filled 2024-02-29 (×2): qty 2

## 2024-02-29 MED ORDER — ENOXAPARIN SODIUM 30 MG/0.3ML IJ SOSY
30.0000 mg | PREFILLED_SYRINGE | INTRAMUSCULAR | Status: DC
  Administered 2024-02-29: 30 mg via SUBCUTANEOUS
  Filled 2024-02-29: qty 0.3

## 2024-02-29 MED ORDER — LINEZOLID 600 MG/300ML IV SOLN: 600.0000 mg | Freq: Two times a day (BID) | INTRAVENOUS | Status: DC

## 2024-02-29 MED ORDER — HEPARIN (PORCINE) 25000 UT/250ML-% IV SOLN
950.0000 [IU]/h | INTRAVENOUS | Status: DC
  Administered 2024-02-29: 950 [IU]/h via INTRAVENOUS
  Filled 2024-02-29: qty 250

## 2024-02-29 MED ORDER — SODIUM CHLORIDE 0.9 % IV SOLN: INTRAVENOUS | Status: AC | PRN

## 2024-02-29 MED ORDER — PIPERACILLIN-TAZOBACTAM 3.375 G IVPB
3.3750 g | Freq: Three times a day (TID) | INTRAVENOUS | Status: DC
  Administered 2024-02-29 – 2024-03-01 (×5): 3.375 g via INTRAVENOUS
  Filled 2024-02-29 (×5): qty 50

## 2024-02-29 MED ORDER — VANCOMYCIN HCL IN DEXTROSE 1-5 GM/200ML-% IV SOLN
1000.0000 mg | Freq: Once | INTRAVENOUS | Status: AC
  Administered 2024-02-29: 1000 mg via INTRAVENOUS
  Filled 2024-02-29: qty 200

## 2024-02-29 MED ORDER — SODIUM CHLORIDE 0.9% FLUSH: 10.0000 mL | INTRAVENOUS | Status: DC | PRN

## 2024-02-29 MED ORDER — ACETAMINOPHEN 650 MG RE SUPP: 650.0000 mg | Freq: Four times a day (QID) | RECTAL | Status: DC | PRN

## 2024-02-29 MED ORDER — FENTANYL CITRATE PF 50 MCG/ML IJ SOSY: 12.5000 ug | PREFILLED_SYRINGE | INTRAMUSCULAR | Status: DC | PRN

## 2024-02-29 MED ORDER — FUROSEMIDE 10 MG/ML IJ SOLN: 40.0000 mg | Freq: Once | INTRAMUSCULAR | Status: DC

## 2024-02-29 MED ORDER — PANTOPRAZOLE SODIUM 40 MG IV SOLR
40.0000 mg | INTRAVENOUS | Status: DC
  Administered 2024-02-29 – 2024-03-03 (×4): 40 mg via INTRAVENOUS
  Filled 2024-02-29 (×4): qty 10

## 2024-02-29 MED ORDER — FUROSEMIDE 10 MG/ML IJ SOLN
40.0000 mg | Freq: Once | INTRAMUSCULAR | Status: AC
  Administered 2024-02-29: 40 mg via INTRAVENOUS
  Filled 2024-02-29: qty 4

## 2024-02-29 MED ORDER — SODIUM CHLORIDE 0.9 % IV SOLN: INTRAVENOUS | Status: DC | PRN

## 2024-02-29 NOTE — Progress Notes (Signed)
 Peripherally Inserted Central Catheter Placement  The IV Nurse has discussed with the patient and/or persons authorized to consent for the patient, the purpose of this procedure and the potential benefits and risks involved with this procedure.  The benefits include less needle sticks, lab draws from the catheter, and the patient may be discharged home with the catheter. Risks include, but not limited to, infection, bleeding, blood clot (thrombus formation), and puncture of an artery; nerve damage and irregular heartbeat and possibility to perform a PICC exchange if needed/ordered by physician.  Alternatives to this procedure were also discussed.  Bard Power PICC patient education guide, fact sheet on infection prevention and patient information card has been provided to patient /or left at bedside.   New blood culture drawn this am , clarify to Deward Eastern NP , okay'd to insert PICC  PICC Placement Documentation  PICC Triple Lumen 02/29/24 Right Basilic 30 cm 0 cm (Active)  Indication for Insertion or Continuance of Line Vasoactive infusions 02/29/24 1453  Exposed Catheter (cm) 0 cm 02/29/24 1453  Site Assessment Clean, Dry, Intact 02/29/24 1453  Lumen #1 Status Flushed;Blood return noted;Saline locked 02/29/24 1453  Lumen #2 Status Flushed;Blood return noted;Saline locked 02/29/24 1453  Lumen #3 Status Flushed;Blood return noted;Saline locked 02/29/24 1453  Dressing Type Transparent 02/29/24 1453  Dressing Status Antimicrobial disc/dressing in place 02/29/24 1453  Line Care Connections checked and tightened 02/29/24 1453  Line Adjustment (NICU/IV Team Only) No 02/29/24 1453  Dressing Intervention New dressing 02/29/24 1453  Dressing Change Due 03/07/24 02/29/24 1453       Aron Maryalice Dolores 02/29/2024, 2:57 PM

## 2024-02-29 NOTE — Procedures (Signed)
 Arterial Catheter Insertion Procedure Note  Rhonda Marshall  [id number]  February 10, 1951  Date:02/29/24  Time:9:31 AM    Provider Performing: Rexene LOISE Blush    Procedure: Insertion of Arterial Line (63379) with US  guidance (23062)   Indication(s) Blood pressure monitoring and/or need for frequent ABGs  Consent Risks of the procedure as well as the alternatives and risks of each were explained to the patient and/or caregiver.  Consent for the procedure was obtained and is signed in the bedside chart  Anesthesia None   Time Out Verified patient identification, verified procedure, site/side was marked, verified correct patient position, special equipment/implants available, medications/allergies/relevant history reviewed, required imaging and test results available.   Sterile Technique Maximal sterile technique including full sterile barrier drape, hand hygiene, sterile gown, sterile gloves, mask, hair covering, sterile ultrasound probe cover (if used).   Procedure Description Area of catheter insertion was cleaned with chlorhexidine  and draped in sterile fashion. With real-time ultrasound guidance an arterial catheter was placed into the left radial artery.  Appropriate arterial tracings confirmed on monitor.     Complications/Tolerance None; patient tolerated the procedure well.   EBL Minimal   Specimen(s) None   Rexene LOISE Blush, PA-C Pulmonary Critical Care

## 2024-02-29 NOTE — Progress Notes (Signed)
 eLink Physician-Brief Progress Note Patient Name: Rhonda Marshall DOB: 14-Jan-1951 MRN: [id number]   Date of Service  02/29/2024  HPI/Events of Note    eICU Interventions  Hold lasix  due to soft BP. Options incl levo gtt + lasix  or albumin  + lasix  Rounding team to decide     Intervention Category Intermediate Interventions: Medication change / dose adjustment  Rhonda Marshall 02/29/2024, 5:22 AM

## 2024-02-29 NOTE — IPAL (Signed)
  Interdisciplinary Goals of Care Family Meeting   Date carried out: 02/29/2024  Location of the meeting: Phone conference  Member's involved: Nurse Practitioner and Family Member or next of kin  Durable Power of Attorney or acting medical decision maker:   Son Shenita Trego  Discussion: We discussed goals of care for [employer] . This was initiated because family gave me the impression DD had been through enough poking and prodding and comfort was paramount.  Family understands Shavona is quite ill and may be experiencing end stage disease processes, however, Reyes feels confident DD would want to exhaust all possible treatment options before considering a transition to a more palliative approach. He believes she would not do well if she required CPR, so elects for DNR. He believes DD would want to be intubated if necessary.   Code status:   Code Status: Do not attempt resuscitation (DNR) PRE-ARREST INTERVENTIONS DESIRED   Disposition: Continue current acute care  Time spent for the meeting: 23 min    Deward Eastern, AGACNP-BC [facility]  See Amion for personal pager PCCM on call pager [phone] until 7pm. Please call Elink 7p-7a. [phone]  02/29/2024 1:26 PM

## 2024-02-29 NOTE — Consult Note (Signed)
 [facility]                                                                                        Patient Identification: Patient Name: Oleta Gunnoe MRN: [id number] Admit Date: 02/28/2024  5:41 PM Today's Date: 02/29/2024 Reason for consult: possible small vegetation in the AV Requesting provider: PCCM  Principal Problem:   CHF exacerbation (HCC) Active Problems:   AKI (acute kidney injury) (HCC)   Acute hypoxemic respiratory failure (HCC)   Severe sepsis (HCC)   Acute encephalopathy   Antibiotics:  Vancomycin  8/17-c Zosyn  8/17-c  Lines/Hardware: Rt midline, Left radial arterial line   Assessment # Enterococcus hirae/staph epidermidis bacteremia  - 6/20 TTE with moderate calcification of the aortic valve and moderate thickening of the AV Recommendations      Rest of the management as per the primary team. Please call with questions or concerns.  Thank you for the consult  __________________________________________________________________________________________________________ HPI and Hospital Course: 73 year old female with prior history of A-fib on AC, RA, HTN, subclinical hyperthyroidism, CHF, CKD, RT THA who presented to ED on 8/17 for hypoxia, confusion, poor po appetite and leaking fluids from legs.  Recently admitted 6/19-6/25 for septic shock, CHF and Enterococcal bacteremia with inability to r/o endocarditis and discharged on 6 weeks of IV ampicillin  and IV ceftriaxone  which she completed on 8/2. Per daughter, patient was swelling more despite giving lasix .   At ED initially Temp 78F Labs creatinine elevated to 1.68, ALP 135, albumin  1.9, TB 0.5, TB 1.4, BNP 3431.8, Troponin 18, WBC 3.8 8/17 blood cx NGTD   Started on Bipap.   CXR 1. Increased bilateral pleural effusions, partially loculated on the right. Associated bibasilar opacities. 2. Cardiomegaly with  slight vascular congestion.  Xray elbow  IMPRESSION: Prominent generalized soft tissue edema. No acute osseous abnormality.  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   ROS: General- Denies fever, chills, loss of appetite and loss of weight HEENT - Denies headache, blurry vision, neck pain, sinus pain Chest - Denies any chest pain, SOB or cough CVS- Denies any dizziness/lightheadedness, syncopal attacks, palpitations Abdomen- Denies any nausea, vomiting, abdominal pain, hematochezia and diarrhea Neuro - Denies any weakness, numbness, tingling sensation Psych - Denies any changes in mood irritability or depressive symptoms GU- Denies any burning, dysuria, hematuria or increased frequency of urination Skin - denies any rashes/lesions MSK - denies any joint pain/swelling or restricted ROM   Past Medical History:  Diagnosis Date   Anxiety    Atrial fibrillation (HCC)    newly diagnosed 10/2010   Family history of adverse reaction to anesthesia    Sister's BP drops    Hip fracture (HCC) 01/18/2022   Hypertension    Rheumatoid arthritis(714.0)    Subclinical hyperthyroidism    Past Surgical History:  Procedure Laterality Date   Arm surgery     fracture, right arm   CARDIOVERSION N/A 08/10/2014   Procedure: CARDIOVERSION;  Surgeon: Leim VEAR Moose, MD;  Location: [facility] ENDOSCOPY;  Service:  Cardiovascular;  Laterality: N/A;   CARDIOVERSION N/A 01/22/2022   Procedure: CARDIOVERSION;  Surgeon: Hobart Powell BRAVO, MD;  Location: [facility] ENDOSCOPY;  Service: Cardiovascular;  Laterality: N/A;   CATARACT EXTRACTION     DIRECT LARYNGOSCOPY N/A 09/02/2019   Procedure: DIRECT LARYNGOSCOPY WITH BIOPSY;  Surgeon: Ethyl Lonni BRAVO, MD;  Location: [facility];  Service: ENT;  Laterality: N/A;   HIP ARTHROPLASTY Right 01/19/2022   Procedure:  ARTHROPLASTY BIPOLAR HIP (HEMIARTHROPLASTY);  Surgeon: Josefina Chew, MD;  Location: [facility] ORS;  Service: Orthopedics;  Laterality: Right;   IR GASTROSTOMY TUBE MOD SED  10/17/2019   IR GASTROSTOMY TUBE REMOVAL  01/03/2020   TEE WITHOUT CARDIOVERSION N/A 01/22/2022   Procedure: TRANSESOPHAGEAL ECHOCARDIOGRAM (TEE);  Surgeon: Hobart Powell BRAVO, MD;  Location: [facility] ENDOSCOPY;  Service: Cardiovascular;  Laterality: N/A;   TUBAL LIGATION     Scheduled Meds:  Chlorhexidine  Gluconate Cloth  6 each Topical Q0600   mouth rinse  15 mL Mouth Rinse 4 times per day   pantoprazole  (PROTONIX ) IV  40 mg Intravenous Q24H   Continuous Infusions:  sodium chloride  Stopped (02/29/24 1109)   heparin      [START ON 03/01/2024] linezolid  (ZYVOX ) IV     piperacillin -tazobactam (ZOSYN )  IV 12.5 mL/hr at 02/29/24 1211   PRN Meds:.sodium chloride , acetaminophen  **OR** acetaminophen , mouth rinse, sodium chloride  flush  Allergies  Allergen Reactions   Clindamycin/Lincomycin Rash   Latex Rash   Plaquenil  [Hydroxychloroquine ] Other (See Comments)    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    Social History   Socioeconomic History   Marital status: Widowed    Spouse name: Not on file   Number of children: 1   Years of education: Not on file   Highest education level: Not on file  Occupational History   Occupation: laborer    Comment: [employer]   Occupation: retired  Tobacco Use   Smoking status: Former    Current packs/day: 0.00    Average packs/day: 0.3 packs/day for 54.2 years (13.6 ttl pk-yrs)    Types: Cigarettes    Start date: 30    Quit date: 10/2019    Years since quitting: 4.3    Passive exposure: Past   Smokeless tobacco: Never  Vaping Use   Vaping status: Never Used  Substance and Sexual Activity   Alcohol use: Not Currently    Alcohol/week: 0.0 standard drinks of alcohol   Drug use: No   Sexual activity: Not on file  Other Topics Concern   Not on file  Social History Narrative   Not on file    Social Drivers of Health   Financial Resource Strain: Low Risk  (08/18/2023)   Overall Financial Resource Strain (CARDIA)    Difficulty of Paying Living Expenses: Not very hard  Food Insecurity: No Food Insecurity (12/31/2023)   Hunger Vital Sign    Worried About Running Out of Food in the Last Year: Never true    Ran Out of Food in the Last Year: Never true  Transportation Needs: No Transportation Needs (12/31/2023)   PRAPARE - Administrator, Civil Service (Medical): No    Lack of Transportation (Non-Medical): No  Physical Activity: Not on file  Stress: Not on file  Social Connections: Moderately Integrated (12/31/2023)   Social Connection and Isolation Panel    Frequency of Communication with Friends and Family: Once a week    Frequency of Social Gatherings with Friends and Family: More than three times a week  Attends Religious Services: 1 to 4 times per year    Active Member of Clubs or Organizations: No    Attends Banker Meetings: 1 to 4 times per year    Marital Status: Widowed  Intimate Partner Violence: Not At Risk (12/31/2023)   Humiliation, Afraid, Rape, and Kick questionnaire    Fear of Current or Ex-Partner: No    Emotionally Abused: No    Physically Abused: No    Sexually Abused: No   Family History  Problem Relation Age of Onset   Heart attack Mother 23       pacemaker age 36   Hypothyroidism Mother    Cancer Father        colon and prostate   Hypothyroidism Sister    Cancer Sister    Diabetes Brother    CAD Brother 49   Cancer Brother        Renal   Vitals BP (!) 92/40   Pulse 79   Temp 98.1 F (36.7 C)   Resp 18   Ht 5' 2 (1.575 m)   Wt 61.7 kg   SpO2 99%   BMI 24.88 kg/m    Physical Exam Constitutional:      Comments:   Cardiovascular:     Rate and Rhythm: Normal rate and regular rhythm.     Heart sounds: No murmur heard.   Pulmonary:     Effort: Pulmonary effort is normal.     Comments:   Abdominal:      Palpations: Abdomen is soft.     Tenderness:   Musculoskeletal:        General: No swelling or tenderness.   Skin:    Comments:   Neurological:     General: No focal deficit present.   Psychiatric:        Mood and Affect: Mood normal.    Pertinent Microbiology Results for orders placed or performed during the hospital encounter of 02/28/24  Culture, blood (routine x 2)     Status: None (Preliminary result)   Collection Time: 02/28/24  6:25 PM   Specimen: BLOOD  Result Value Ref Range Status   Specimen Description   Final    BLOOD BLOOD LEFT ARM Performed at [facility], [address], [place], [place] [address]    Special Requests   Final    BOTTLES DRAWN AEROBIC AND ANAEROBIC Blood Culture adequate volume Performed at [facility], [address], [place], [place] [address]    Culture   Final    NO GROWTH < 12 HOURS Performed at [facility], [address], [place], [place] [address]    Report Status PENDING  Incomplete  Resp panel by RT-PCR (RSV, Flu A&B, Covid) Anterior Nasal Swab     Status: None   Collection Time: 02/28/24  6:30 PM   Specimen: Anterior Nasal Swab  Result Value Ref Range Status   SARS Coronavirus 2 by RT PCR NEGATIVE NEGATIVE Final    Comment: (NOTE) SARS-CoV-2 target nucleic acids are NOT DETECTED.  The SARS-CoV-2 RNA is generally detectable in upper respiratory specimens during the acute phase of infection. The lowest concentration of SARS-CoV-2 viral copies this assay can detect is 138 copies/mL. A negative result does not preclude SARS-Cov-2 infection and should not be used as the sole basis for treatment or other patient management decisions. A negative result may occur with  improper specimen collection/handling, submission of specimen other than nasopharyngeal swab, presence of viral  mutation(s) within the areas targeted by this assay, and inadequate number of viral copies(<138 copies/mL). A  negative result must be combined with clinical observations, patient history, and epidemiological information. The expected result is Negative.  Fact Sheet for Patients:  BloggerCourse.com  Fact Sheet for Healthcare Providers:  SeriousBroker.it  This test is no t yet approved or cleared by the [place]  FDA and  has been authorized for detection and/or diagnosis of SARS-CoV-2 by FDA under an Emergency Use Authorization (EUA). This EUA will remain  in effect (meaning this test can be used) for the duration of the COVID-19 declaration under Section 564(b)(1) of the Act, 21 U.S.C.section 360bbb-3(b)(1), unless the authorization is terminated  or revoked sooner.       Influenza A by PCR NEGATIVE NEGATIVE Final   Influenza B by PCR NEGATIVE NEGATIVE Final    Comment: (NOTE) The Xpert Xpress SARS-CoV-2/FLU/RSV plus assay is intended as an aid in the diagnosis of influenza from Nasopharyngeal swab specimens and should not be used as a sole basis for treatment. Nasal washings and aspirates are unacceptable for Xpert Xpress SARS-CoV-2/FLU/RSV testing.  Fact Sheet for Patients: BloggerCourse.com  Fact Sheet for Healthcare Providers: SeriousBroker.it  This test is not yet approved or cleared by the [place]  FDA and has been authorized for detection and/or diagnosis of SARS-CoV-2 by FDA under an Emergency Use Authorization (EUA). This EUA will remain in effect (meaning this test can be used) for the duration of the COVID-19 declaration under Section 564(b)(1) of the Act, 21 U.S.C. section 360bbb-3(b)(1), unless the authorization is terminated or revoked.     Resp Syncytial Virus by PCR NEGATIVE NEGATIVE Final    Comment: (NOTE) Fact Sheet for Patients: BloggerCourse.com  Fact Sheet for Healthcare  Providers: SeriousBroker.it  This test is not yet approved or cleared by the [place]  FDA and has been authorized for detection and/or diagnosis of SARS-CoV-2 by FDA under an Emergency Use Authorization (EUA). This EUA will remain in effect (meaning this test can be used) for the duration of the COVID-19 declaration under Section 564(b)(1) of the Act, 21 U.S.C. section 360bbb-3(b)(1), unless the authorization is terminated or revoked.  Performed at [facility], [address] [place], [place] [address]   MRSA Next Gen by PCR, Nasal     Status: None   Collection Time: 02/29/24  3:35 AM   Specimen: Nasal Mucosa; Nasal Swab  Result Value Ref Range Status   MRSA by PCR Next Gen NOT DETECTED NOT DETECTED Final    Comment: (NOTE) The GeneXpert MRSA Assay (FDA approved for NASAL specimens only), is one component of a comprehensive MRSA colonization surveillance program. It is not intended to diagnose MRSA infection nor to guide or monitor treatment for MRSA infections. Test performance is not FDA approved in patients less than 11 years old. Performed at [facility], [address], [place], [place] [address]    Pertinent Lab seen by me:    Latest Ref Rng & Units 02/29/2024    4:19 AM 02/28/2024    6:25 PM 01/07/2024    4:35 AM  CBC  WBC 4.0 - 10.5 K/uL 3.6  3.8  6.3   Hemoglobin 12.0 - 15.0 g/dL 87.9  87.0  87.7   Hematocrit 36.0 - 46.0 % 39.7  43.3  40.3   Platelets 150 - 400 K/uL 150  180  176       Latest Ref Rng & Units 02/29/2024    5:35 AM 02/28/2024  6:25 PM 01/07/2024    4:35 AM  CMP  Glucose 70 - 99 mg/dL 96  871  96   BUN 8 - 23 mg/dL 30  30  26    Creatinine 0.44 - 1.00 mg/dL 8.34  8.31  8.86   Sodium 135 - 145 mmol/L 134  133  132   Potassium 3.5 - 5.1 mmol/L 3.8  4.3  3.3   Chloride 98 - 111 mmol/L 90  87  91   CO2 22 - 32 mmol/L 32  30  28   Calcium 8.9 - 10.3 mg/dL 9.0  9.6  9.1   Total  Protein 6.5 - 8.1 g/dL 5.4  7.0  6.5   Total Bilirubin 0.0 - 1.2 mg/dL 1.1  1.4  1.1   Alkaline Phos 38 - 126 U/L 105  135  163   AST 15 - 41 U/L 22  31  28    ALT 0 - 44 U/L 12  14  23      Pertinent Imagings/Other Imagings Plain films and CT images have been personally visualized and interpreted; radiology reports have been reviewed. Decision making incorporated into the Impression / Recommendations.  US  EKG SITE RITE Result Date: 02/29/2024 If Site Rite image not attached, placement could not be confirmed due to current cardiac rhythm.  ECHOCARDIOGRAM LIMITED Result Date: 02/29/2024    ECHOCARDIOGRAM LIMITED REPORT   Patient Name:   MIKESHA MIGLIACCIO Date of Exam: 02/29/2024 Medical Rec #:  [id number]        Height:       62.0 in Accession #:    [id number]       Weight:       136.0 lb Date of Birth:  07/16/1950        BSA:          1.623 m Patient Age:    73 years         BP:           73/34 mmHg Patient Gender: F                HR:           83 bpm. Exam Location:  Inpatient Procedure: 2D Echo, Limited Echo, Cardiac Doppler, Color Doppler and Strain            Analysis (Both Spectral and Color Flow Doppler were utilized during            procedure). Indications:     CHF  History:         Patient has prior history of Echocardiogram examinations, most                  recent 01/01/2024. CHF, Arrythmias:Atrial Fibrillation,                  Signs/Symptoms:Shortness of Breath and Edema; Risk                  Factors:Hypertension.  Sonographer:     Therisa Crouch Referring Phys:  [id number] JESSICA MARSHALL Diagnosing Phys: Ezra McleanMD IMPRESSIONS  1. Left ventricular ejection fraction, by estimation, is 30 to 35%. Left ventricular ejection fraction by 2D MOD biplane is 33.5 %. The left ventricle has moderately decreased function. The left ventricle demonstrates global hypokinesis with septal-lateral dyssynchrony suggestive of LBBB. There is mild concentric left ventricular hypertrophy. Left ventricular  diastolic parameters are indeterminate.  2. Right ventricular systolic function is mildly reduced. The right ventricular size is normal. There is  normal pulmonary artery systolic pressure. The estimated right ventricular systolic pressure is 25.0 mmHg.  3. Left atrial size was severely dilated.  4. Right atrial size was mildly dilated.  5. There is mild calcification of the aortic valve. Aortic valve regurgitation is not visualized. No aortic stenosis is present. 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  6. The mitral valve is normal in structure. Trivial mitral valve regurgitation. No evidence of mitral stenosis.  7. The inferior vena cava is normal in size with <50% respiratory variability, suggesting right atrial pressure of 8 mmHg.  8. Left pleural effusion noted. A small pericardial effusion is present.  9. The patient was in atrial fibrillation. FINDINGS  Left Ventricle: Left ventricular ejection fraction, by estimation, is 30 to 35%. Left ventricular ejection fraction by 2D MOD biplane is 33.5 %. The left ventricle has moderately decreased function. The left ventricle demonstrates global hypokinesis. The average left ventricular global longitudinal strain is -8.6 %. Strain was performed and the global longitudinal strain is abnormal. The left ventricular internal cavity size was normal in size. There is mild concentric left ventricular hypertrophy. Left ventricular diastolic parameters are indeterminate. Right Ventricle: The right ventricular size is normal. Right ventricular systolic function is mildly reduced. There is normal pulmonary artery systolic pressure. The tricuspid regurgitant velocity is 2.06 m/s, and with an assumed right atrial pressure of  8 mmHg, the estimated right ventricular systolic pressure is 25.0 mmHg. Left Atrium: Left atrial size was severely  dilated. Right Atrium: Right atrial size was mildly dilated. Pericardium: Left pleural effusion noted. A small pericardial effusion is present. Mitral Valve: The mitral valve is normal in structure. Trivial mitral valve regurgitation. No evidence of mitral valve stenosis. Tricuspid Valve: The tricuspid valve is normal in structure. Tricuspid valve regurgitation is mild. Aortic Valve: There is mild calcification of the aortic valve. Aortic valve regurgitation is not visualized. No aortic stenosis is present. Venous: The inferior vena cava is normal in size with less than 50% respiratory variability, suggesting right atrial pressure of 8 mmHg. IAS/Shunts: No atrial level shunt detected by color flow Doppler. LEFT VENTRICLE PLAX 2D                        Biplane EF (MOD) LVIDd:         3.90 cm         LV Biplane EF:   Left LVIDs:         3.40 cm                          ventricular LV PW:         1.10 cm                          ejection LV IVS:        1.20 cm                          fraction by                                                 2D MOD  biplane is LV Volumes (MOD)                                33.5 %. LV vol d, MOD    80.7 ml A2C: LV vol d, MOD    76.4 ml       2D Longitudinal A4C:                           Strain LV vol s, MOD    50.8 ml       2D Strain GLS   -8.6 % A2C:                           Avg: LV vol s, MOD    53.6 ml A4C: LV SV MOD A2C:   29.9 ml LV SV MOD A4C:   76.4 ml LV SV MOD BP:    27.0 ml RIGHT VENTRICLE          IVC RV Basal diam:  3.80 cm  IVC diam: 1.90 cm RV Mid diam:    2.80 cm LEFT ATRIUM              Index        RIGHT ATRIUM           Index LA diam:        3.80 cm  2.34 cm/m   RA Area:     18.30 cm LA Vol (A2C):   109.0 ml 67.17 ml/m  RA Volume:   47.40 ml  29.21 ml/m LA Vol (A4C):   103.0 ml 63.47 ml/m LA Biplane Vol: 106.0 ml 65.32 ml/m  TRICUSPID VALVE TR Peak grad:   17.0 mmHg TR Vmax:        206.00 cm/s Dalton McleanMD  Electronically signed by Ezra Kanner Signature Date/Time: 02/29/2024/8:17:39 AM    Final (Updated)    CT HEAD WO CONTRAST ( ) Result Date: 02/29/2024 CLINICAL DATA:  Delirium EXAM: CT HEAD WITHOUT CONTRAST TECHNIQUE: Contiguous axial images were obtained from the base of the skull through the vertex without intravenous contrast. RADIATION DOSE REDUCTION: This exam was performed according to the departmental dose-optimization program which includes automated exposure control, adjustment of the mA and/or kV according to patient size and/or use of iterative reconstruction technique. COMPARISON:  CT head 01/03/2024 FINDINGS: Brain: No intracranial hemorrhage, mass effect, or evidence of acute infarct. No hydrocephalus. No extra-axial fluid collection. Age-commensurate cerebral atrophy and chronic small vessel ischemic disease. Vascular: No hyperdense vessel. Intracranial arterial calcification. Skull: No fracture or focal lesion. Sinuses/Orbits: No acute finding. Other: None. IMPRESSION: No acute intracranial abnormality. Electronically Signed   By: Norman Gatlin M.D.   On: 02/29/2024 03:04   DG Elbow 2 Views Right Result Date: 02/28/2024 CLINICAL DATA:  Pain. EXAM: RIGHT ELBOW - 2 VIEW COMPARISON:  None Available. FINDINGS: There is no evidence of fracture, dislocation, or definite joint effusion. There is no evidence of arthropathy or other focal bone abnormality. No erosive change. Prominent generalized soft tissue edema. IMPRESSION: Prominent generalized soft tissue edema. No acute osseous abnormality. Electronically Signed   By: Andrea Gasman M.D.   On: 02/28/2024 18:44   DG Chest Port 1 View Result Date: 02/28/2024 CLINICAL DATA:  Shortness of breath. EXAM: PORTABLE CHEST 1 VIEW COMPARISON:  Most recent radiograph 01/07/2024 FINDINGS: Increased bilateral pleural effusions, partially loculated on the right.  Associated bibasilar opacities. The heart is enlarged but stable. Slight vascular  congestion. No pneumothorax. IMPRESSION: 1. Increased bilateral pleural effusions, partially loculated on the right. Associated bibasilar opacities. 2. Cardiomegaly with slight vascular congestion. Electronically Signed   By: Andrea Gasman M.D.   On: 02/28/2024 18:44    I spent 85 minutes involved in face-to-face and non-face-to-face activities for this patient on the day of the visit. Professional time spent includes the following activities: Preparing to see the patient (review of tests), Obtaining and reviewing separately obtained history (admission/discharge record), Performing a medically appropriate examination and evaluation , Ordering medications/labs, referring and communicating with other health care professionals, Documenting clinical information in the EMR, Independently interpreting results (not separately reported), Communicating results to the patient/family, Counseling and educating the patient/family and Care coordination (not separately reported).  Electronically signed by:   Plan d/w requesting provider as well as ID pharm D  Of note, portions of this note may have been created with voice recognition software. While this note has been edited for accuracy, occasional wrong-word or 'sound-a-like' substitutions may have occurred due to the inherent limitations of voice recognition software.   Annalee Orem, MD Infectious Disease Physician [facility] Pager: [phone]

## 2024-02-29 NOTE — Plan of Care (Signed)
  Problem: Education: Goal: Knowledge of General Education information will improve Description: Including pain rating scale, medication(s)/side effects and non-pharmacologic comfort measures Outcome: Progressing   Problem: Clinical Measurements: Goal: Respiratory complications will improve Outcome: Progressing   Problem: Coping: Goal: Level of anxiety will decrease Outcome: Progressing   Problem: Elimination: Goal: Will not experience complications related to urinary retention Outcome: Progressing   Problem: Pain Managment: Goal: General experience of comfort will improve and/or be controlled Outcome: Progressing

## 2024-02-29 NOTE — Progress Notes (Signed)
 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   Medical History: Past Medical History:  Diagnosis Date   Anxiety    Atrial fibrillation (HCC)    newly diagnosed 10/2010   Family history of adverse reaction to anesthesia    Sister's BP drops    Hip fracture (HCC) 01/18/2022   Hypertension    Rheumatoid arthritis(714.0)    Subclinical hyperthyroidism     Medications:  Medications Prior to Admission  Medication Sig Dispense Refill Last Dose/Taking   acetaminophen  (TYLENOL ) 500 MG tablet Take 500 mg by mouth every 4 (four) hours as needed for moderate pain (pain score 4-6).   Past Week   ALPRAZolam  (XANAX ) 1 MG tablet Take 1 mg by mouth 3 (three) times daily as needed for anxiety.   02/28/2024   amiodarone  (PACERONE ) 200 MG tablet Take 0.5 tablets (100 mg total) by mouth daily. 45 tablet 3 02/27/2024   diclofenac  Sodium (VOLTAREN ) 1 % GEL Apply 2 g topically 4 (four) times daily as needed (pain).   Past Month   ELIQUIS  5 MG TABS tablet Take 1 tablet (5 mg total) by mouth 2 (two) times daily. 180 tablet 1 02/27/2024   furosemide  (LASIX ) 20 MG tablet Take 1 tablet (20 mg total) by  mouth as needed. (Patient taking differently: Take 20 mg by mouth See admin instructions. Take 20mg  (1 tablet) by mouth for 3 days on, then 3 days off, then repeat.)   Past Week   loperamide  (IMODIUM  A-D) 2 MG tablet Take 2 mg by mouth 4 (four) times daily as needed for diarrhea or loose stools.   Unknown   losartan  (COZAAR ) 25 MG tablet Take 1 tablet (25 mg total) by mouth daily. 30 tablet 11 Past Week   methimazole  (TAPAZOLE ) 5 MG tablet Take 0.5 tablets (2.5 mg total) by mouth daily. 45 tablet 3 Past Week   metoprolol  succinate (TOPROL -XL) 100 MG 24 hr tablet Take 50 mg by mouth 2 (two) times daily. Take with or immediately following a meal.   02/27/2024   oxyCODONE -acetaminophen  (PERCOCET) 10-325 MG tablet Take 1 tablet by mouth every 4 (four) hours as needed for pain. (Patient taking differently: Take 1 tablet by mouth 4 (four) times daily.) 30 tablet 0 02/28/2024   sodium chloride  1 g tablet Take 1 tablet (1 g total) by mouth as needed.   Unknown   Vitamin D , Ergocalciferol , (DRISDOL) 1.25 MG (50000 UNIT) CAPS capsule Take 50,000 Units by mouth once a week.   02/22/2024   ampicillin  (OMNIPEN)  2 g injection  (Patient not taking: Reported on 02/29/2024)   Not Taking   cefTRIAXone  (ROCEPHIN ) 10 g injection  (Patient not taking: Reported on 02/29/2024)   Not Taking    Assessment: Pharmacy is consulted to start IV heparin  on 73 yo female diagnosed with afib. Pt PTA med list includes apixaban  5 mg PO BID with last dose being on 8/16 in PM.   Pt just received a dose of enoxaparin  30 mg at 1105 on 8/18 prior to IV heparin  being ordered.   Today, 02/29/24 Hgb 12.0, plt 150  Scr 1.65 mg/dl, CrCl 25 ml/min  Baseline HL >1.10, indicating presence of apixaban  as expected due to poor renal function  Goal of Therapy:  Heparin  level 0.3-0.7 units/ml aPTT 66-102 sec  Monitor platelets by anticoagulation protocol: Yes   Plan:  Due to recent administration of enoxaparin  dose ( would be 1/2 of total daily  dose with current renal function and elevated anti-xa level indication apixaban  presence, will start heparin  at 2200 tonight Heparin  950 units/hr starting at 2200 on 8/18  Obtain aPTT, HL 8 hours after start of infusion  Daily CBC while on heparin  Monitor for signs and symptoms of bleeding      Dolphus Roller, PharmD, BCPS 02/29/2024 12:08 PM

## 2024-02-29 NOTE — Procedures (Signed)
 Arterial Line Insertion Start/End8/18/2025 10:30 PM, 02/29/2024 10:50 PM  Patient location: ICU. Preanesthetic checklist: patient identified, IV checked, site marked, risks and benefits discussed, surgical consent, monitors and equipment checked, pre-op evaluation and timeout performed radial was placed Catheter size: 22 G Hand hygiene performed  and maximum sterile barriers used  Allen's test indicative of satisfactory collateral circulation Attempts: 2 Procedure performed without using ultrasound guided technique. Following insertion, dressing applied and Biopatch. Post procedure assessment: unchanged  Post procedure complications: unsuccessful attempts. Patient tolerated the procedure well with no immediate complications.

## 2024-02-29 NOTE — Progress Notes (Signed)
 eLink Physician-Brief Progress Note Patient Name: Rhonda Marshall DOB: July 21, 1950 MRN: [id number]   Date of Service  02/29/2024  HPI/Events of Note  Pt pulled out A line at shift change.  Cuff BP low currently, but cuff pressures had been reading lower compared to the arterial line.   eICU Interventions  Ordered A line reinsertion.         Dev Dhondt M DELA CRUZ 02/29/2024, 9:09 PM  4:09 AM BP has been downtrending.  Aline now shows SBP 78, MAP 58.  She had been on diuretics, which have been on hold due to hypotension.  Pt remains with significant pitting edema. I/O indicates pt net negative 2L since admission.  Will start on norepinephrine  infusion to target MAP >65

## 2024-02-29 NOTE — Progress Notes (Addendum)
 Patient's left radial arterial line accidentally pulled out by patient, caught on something, no bleeding at sight, pressure applied. Systolic blood pressure prior to arterial line dislodgement 120s-140s. Patient mental status stable, alert and oriented x3-4 (not always on date). Critical care NP aware, orders received to take cuff pressure and monitor mental status.  Cuff BP on left upper arm reading 89/54, not correlating with prior arterial line readings, patient's extremities edematous. Last arterial line blood pressure reading, 133/56. Previously unable to match cuff and arterial due to right PICC line and left radial arterial line. Report given to nightshift RN.

## 2024-02-29 NOTE — Progress Notes (Signed)
 [facility] Progress Note  Patient: Rhonda Marshall     FMW:[id number]  DOA: 02/28/2024   PCP: [name] LABOR, MD       Brief hospital course: This is a 73 year old female with chronic A-fib, HFpEF, rheumatoid arthritis, chronic hyponatremia, CKD stage IIIa, hypertension, anxiety and hypothyroidism. She presents to the hospital for shortness of breath, bilateral lower extremity edema, confusion and swelling of her right elbow. In the ED: Temperature 95.6, respiratory rate in the 30s, pulse ox 77% on room air. SBP in the low 90s WBC count 3.8, sodium 133, chloride 87, creatinine 1.68 with a baseline of 1.0. BNP 3431 Albumin  1.9 Lactic acid 2.8 and 2.5 COVID influenza RSV negative  Treated with IV Lasix  and Zosyn .  Started on BiPAP.  Recent hospital admission was from 6 9 through 6 27 for CHF exacerbation with bilateral pleural effusions, acute hypoxic respiratory failure, acute hyponatremia, AKI, septic shock secondary to multifocal pneumonia/aspiration pneumonia, Enterococcus [name] bacteremia.  There was difficulty in diuresing the patient requiring basis pressure support.  TEE recommended by ID but cardiology did not feel she was a good candidate for this and therefore she was treated with 6 weeks of empiric IV antibiotics. Also noted to have debris in the esophagus and underwent GI eval.  GI felt like she had esophageal dysmotility issues.  SLP eval was completed and the patient was placed on a dysphagia diet.  GI recommended an outpatient endoscopy.  Treated with BiPAP and subsequently weaned to 4 L of oxygen  this morning.  Subjective:   appears confused.  Has no complaints.  States that her hearing on the right side is poor.  Assessment and Plan: Principal Problem: Acute on chronic combined CHF-acute hypoxemic respiratory failure - 2D echo 8/18: EF 30 to 35%, global hypokinesis, left atrium severely dilated, right atrium mildly dilated, left pleural effusion and small  pericardial effusion - Continue diuretics-cardiology consulted - A-line placed by pulmonary critical care - BiPAP as needed   Active Problems:  Sepsis - Small mobile density attached to the ventricular surface of the aortic valve, possible Lambl's excrescence versus small vegetation - Continue Zosyn  and Zyvox   Lactic acidosis - Lactic acid 2.8-improved to 1.3    AKI (acute kidney injury)  - Creatinine 1.68-baseline about 0.9-1.1 - Follow with diuresis  Acute metabolic encephalopathy - CT head negative  A-fib-possible - Rate controlled-amiodarone  on hold - Started on heparin -on Eliquis  as outpatient   History of hypothyroidism - Tapazole  on hold  Chronic hyponatremia - TSH 11.667 - Sodium chloride  tablets on hold  Severe hypoalbuminemia  Poor prognosis-will consult palliative care       Code Status: Do not attempt resuscitation (DNR) PRE-ARREST INTERVENTIONS DESIRED Total time on patient care: 35 minutes DVT prophylaxis: Heparin  infusion   Objective:   Vitals:   02/29/24 1400 02/29/24 1500 02/29/24 1557 02/29/24 1600  BP:      Pulse: 80 78 73 77  Resp: 18 17 18 18   Temp: (!) 97.2 F (36.2 C) (!) 97 F (36.1 C) (!) 97 F (36.1 C) (!) 97 F (36.1 C)  TempSrc:      SpO2: 100% 98% 99% 98%  Weight:      Height:       Filed Weights   02/28/24 1750 02/29/24 0315  Weight: 53.1 kg 61.7 kg   Exam: General exam: Appears comfortable-oriented to person and place but not to situation HEENT: oral mucosa moist Respiratory system: Clear to auscultation.  Cardiovascular system: S1 & S2 heard-irregularly  irregular rate and rhythm Gastrointestinal system: Abdomen soft, non-tender, nondistended. Normal bowel sounds   Extremities: No cyanosis, clubbing or edema-anasarca Psychiatry:  Mood & affect appropriate.    CBC: Recent Labs  Lab 02/28/24 1825 02/29/24 0419  WBC 3.8* 3.6*  NEUTROABS 2.7  --   HGB 12.9 12.0  HCT 43.3 39.7  MCV 86.4 87.3  PLT 180 150    Basic Metabolic Panel: Recent Labs  Lab 02/28/24 1825 02/29/24 0535  NA 133* 134*  K 4.3 3.8  CL 87* 90*  CO2 30 32  GLUCOSE 128* 96  BUN 30* 30*  CREATININE 1.68* 1.65*  CALCIUM 9.6 9.0  MG 2.1  --      Scheduled Meds:  Chlorhexidine  Gluconate Cloth  6 each Topical Q0600   mouth rinse  15 mL Mouth Rinse 4 times per day   pantoprazole  (PROTONIX ) IV  40 mg Intravenous Q24H    Imaging and lab data personally reviewed   Author: [employer]  02/29/2024 6:14 PM  To contact [facility]>   Check the care team in [facility] and look for the attending/consulting [facility] provider listed  Log into [email or web address] and use [facility] universal password   Go to> [facility]  and find provider  If you still have difficulty reaching the provider, please page the [facility] (Director on Call) for the Hospitalists listed on amion

## 2024-02-29 NOTE — Consult Note (Signed)
 WOC Nurse Consult Note: Reason for Consult: requested to assess a Deep pressure injury on R heel Wound type: Pressure injury. DTI. Pressure Injury POA: YES Measurement: aprox. 3 cm x 1.5 cm. Wound bed: intact. 100% dark necrosis. Drainage (amount, consistency, odor) NONE Periwound: intact, dry skin. Dressing procedure/placement/frequency: No necessary dressing. Leave open air. Hydrate with barrier cream daily. Use Prevalon boot to prevent further injuries.  WOC team will not plan to follow further. Please reconsult if further assistance is needed. Thank-you,  Lela Holm RN, CNS, [employer], MSN.  (Phone [phone])

## 2024-02-29 NOTE — Consult Note (Signed)
 NAMEKalya Marshall, MRN:  [id number], DOB:  12/03/1950, LOS: 1 ADMISSION DATE:  02/28/2024, CONSULTATION DATE:  02/29/24 REFERRING MD:  TRH, CHIEF COMPLAINT:  shortness of breath  History of Present Illness:  73 yo female presented with sob and b/l le edema, confusion, swelling of the right elbow since after a fall approximately 1 month ago. Pt was additionally recently admitted the end of June for similar symptoms with heart failure, pleural effusions/ pneumonia and subsequent hypotension requiring low dose vasopressors for adequate diuresis.   Today she was noted to be hypothermic to 95.6, tachypneic and hypoxic with sats in 70's. She was placed on NIV at that time to support her respiratory status. She was noted to have CM with vascular congestion and pleural effusions on cxr. Pt was also increasingly altered and somnolent and with her chronic use of a/c was sent for cth (which proved to be without acute abnormality). TRH had been asked to admit pt. She was given 20 mg lasix  for diuresis and admitted to progressive. However, which her persistent encephalopathy and concern for continued decline CCM was asked to eval. Pt was unable to provide a full review of symptoms and all information was obtained from chart review.   Ccm has been asked to eval with pt's worsening mental status, increasing somnolence and need for NIV.   Upon my eval pt was drowsy, on NIV. BP stable NOT on vasopressors. She was arousable and oriented to self and place.   Pertinent  Medical History  Anxiety Afib, on chronic eliquis  Combined systolic/diastolic heart failure Arthritis ckd3a  Significant Hospital Events: Including procedures, antibiotic start and stop dates in addition to other pertinent events   8/18: Admitted to progressive care with TRH, transferred to stepdown for worsening mentation  Interim History / Subjective:  Transferred to 1239 for worsening mentation, started on BiPap with  improvement Hypotensive this morning, left radial arterial line placed  Objective    Blood pressure (!) 92/40, pulse 84, temperature 99 F (37.2 C), resp. rate 18, height 5' 2 (1.575 m), weight 61.7 kg, SpO2 93%.    FiO2 (%):  [28 %-100 %] 50 %   Intake/Output Summary (Last 24 hours) at 02/29/2024 1126 Last data filed at 02/29/2024 1120 Gross per 24 hour  Intake 112.43 ml  Output 925 ml  Net -812.57 ml   Filed Weights   02/28/24 1750 02/29/24 0315  Weight: 53.1 kg 61.7 kg    Examination: General: chronically ill appearing, elderly female, no acute distress HENT: moist mucus membranes Lungs: breathing comfortably on nasal cannula, breath sounds diminished bilateral bases Cardiovascular: irregular rhythm, rate 70-80's Abdomen: soft, non-tender, and non-distended  Extremities: +Anasarca. Spontaneously moves all 4 extremities, numerous skin lesions on feet and neck with some erythema Neuro: arousable, alert and oriented x 1-2 to person and place GU: deferred  Leukopenic with WBC 3.6 Cr stable at 1.65, baseline Cr~1.0-1.2 Na 134, patient has chronic hyponatremia ~130-134 CTH overnight with NAICP  Resolved problem list   Assessment and Plan   Undifferentiated shock Acute on chronic combined HF (systolic/diastolic) Chronic Afib (on Eliquis ) Lactic acidosis, resolved Leukopenia Meets criteria for severe sepsis given leukopenia, hypothermia, lactic acidosis and hypotension. Lactic acidosis now resolved. Most likely source of infection would be pneumonia vs. bacteremia with recent PICC line outpatient for IV antibiotics. She recently completed empiric treatment for endocarditis with IV ampicillin  and ceftriaxone  on 02/13/2024. Of note, pro-cal was <0.10. UA negative. Acute on chronic combined heart failure certainly contributing. BNP  on admission 3431 and gross total body volume overload on exam. Echo with new mild RV dysfunction and small mobile density on aortic valve, small  pericardial effusion and EF 30-35%.  - Follow-up cultures and continue empiric antibiotics for now, will de-escalate as able - IV diuresis with 40 mg Lasix  - Arterial line placed for closer blood pressure monitoring especially with need for IV diuresis. Has not tolerated GDMT in the past given hypotension. Cardiology consulted.  - Start AC for Afib with heparin  drip  - Consider TEE to further evaluate ?aortic valve mass and consult ID - Place PICC line and check CVP and co-ox  Acute hypoxic/hypercarbic resp failure, improving Pulmonary edema with pleural effusions Bibasilar opacities, concerning for pneumonia On BiPap overnight now weaned to 4L [place] with good gas exchange. Suspect pulmonary edema and pleural effusions in the setting of acute on chronic combined HF and hypoalbuminemia.  - IV diuresis with 40 mg Lasix  per above - BiPap PRN - Wean oxygen  as able to maintain SpO2>92%  Acute metabolic encephalopathy, improving  CTH NAICP.  - Continue to monitor neuro exam  AKI on CKD Chronic hyponatremia Baseline Cr~1.0-1.2. AKI likely cardiorenal in the setting of heart failure exacerbation. Has chronic hyponatremia ~130-134. Currently at baseline.  - IV diuresis per above, monitor daily BMP  Elevated TSH TSH 11.6 on 8/18, T4 normal. Likely subclinical hypothyroidism in the setting of acute illness. - Recheck TFTs in 2-4 weeks  Hypoalbuminemia - NPO for now while we monitor how she does off BiPap - Advance diet as able and RD consult when taking PO  Deconditioning  - Will need PT/OT  Best Practice (right click and Reselect all SmartList Selections daily)   Diet/type: NPO w/ oral meds DVT prophylaxis systemic heparin  Pressure ulcer(s): present on admission  GI prophylaxis: PPI Lines: Arterial Line Foley:  Yes, and it is still needed Code Status:  full code Last date of multidisciplinary goals of care discussion [8/18]  Labs   CBC: Recent Labs  Lab 02/28/24 1825  02/29/24 0419  WBC 3.8* 3.6*  NEUTROABS 2.7  --   HGB 12.9 12.0  HCT 43.3 39.7  MCV 86.4 87.3  PLT 180 150    Basic Metabolic Panel: Recent Labs  Lab 02/28/24 1825 02/29/24 0535  NA 133* 134*  K 4.3 3.8  CL 87* 90*  CO2 30 32  GLUCOSE 128* 96  BUN 30* 30*  CREATININE 1.68* 1.65*  CALCIUM 9.6 9.0  MG 2.1  --    GFR: Estimated Creatinine Clearance: 25.8 mL/min (A) (by C-G formula based on SCr of 1.65 mg/dL (H)). Recent Labs  Lab 02/28/24 1825 02/28/24 1832 02/28/24 2041 02/29/24 0419 02/29/24 0535  PROCALCITON  --   --   --   --  <0.10  WBC 3.8*  --   --  3.6*  --   LATICACIDVEN  --  2.8* 2.5*  --  1.3    Liver Function Tests: Recent Labs  Lab 02/28/24 1825 02/29/24 0535  AST 31 22  ALT 14 12  ALKPHOS 135* 105  BILITOT 1.4* 1.1  PROT 7.0 5.4*  ALBUMIN  1.9* 1.6*   No results for input(s): LIPASE, AMYLASE in the last 168 hours. No results for input(s): AMMONIA in the last 168 hours.  ABG    Component Value Date/Time   PHART 7.48 (H) 01/03/2024 1603   PCO2ART 42 01/03/2024 1603   PO2ART 111 (H) 01/03/2024 1603   HCO3 38.5 (H) 02/29/2024 0107   ACIDBASEDEF 2.4 (  H) 12/31/2023 0430   O2SAT 62.1 02/29/2024 0107     Coagulation Profile: No results for input(s): INR, PROTIME in the last 168 hours.  Cardiac Enzymes: No results for input(s): CKTOTAL, CKMB, CKMBINDEX, TROPONINI in the last 168 hours.  HbA1C: Hgb A1c MFr Bld  Date/Time Value Ref Range Status  08/16/2023 05:53 AM 6.1 (H) 4.8 - 5.6 % Final    Comment:    (NOTE) Pre diabetes:          5.7%-6.4%  Diabetes:              >6.4%  Glycemic control for   <7.0% adults with diabetes   03/12/2023 03:25 AM 6.1 (H) 4.8 - 5.6 % Final    Comment:    (NOTE) Pre diabetes:          5.7%-6.4%  Diabetes:              >6.4%  Glycemic control for   <7.0% adults with diabetes     CBG: No results for input(s): GLUCAP in the last 168 hours.  Review of Systems:   As per  HPI  Past Medical History:  She,  has a past medical history of Anxiety, Atrial fibrillation (HCC), Family history of adverse reaction to anesthesia, Hip fracture (HCC) (01/18/2022), Hypertension, Rheumatoid arthritis(714.0), and Subclinical hyperthyroidism.   Surgical History:   Past Surgical History:  Procedure Laterality Date   Arm surgery     fracture, right arm   CARDIOVERSION N/A 08/10/2014   Procedure: CARDIOVERSION;  Surgeon: Leim VEAR Moose, MD;  Location: [facility] ENDOSCOPY;  Service: Cardiovascular;  Laterality: N/A;   CARDIOVERSION N/A 01/22/2022   Procedure: CARDIOVERSION;  Surgeon: Hobart Powell BRAVO, MD;  Location: [facility] ENDOSCOPY;  Service: Cardiovascular;  Laterality: N/A;   CATARACT EXTRACTION     DIRECT LARYNGOSCOPY N/A 09/02/2019   Procedure: DIRECT LARYNGOSCOPY WITH BIOPSY;  Surgeon: Ethyl Lonni BRAVO, MD;  Location: [facility];  Service: ENT;  Laterality: N/A;   HIP ARTHROPLASTY Right 01/19/2022   Procedure: ARTHROPLASTY BIPOLAR HIP (HEMIARTHROPLASTY);  Surgeon: Josefina Chew, MD;  Location: [facility] ORS;  Service: Orthopedics;  Laterality: Right;   IR GASTROSTOMY TUBE MOD SED  10/17/2019   IR GASTROSTOMY TUBE REMOVAL  01/03/2020   TEE WITHOUT CARDIOVERSION N/A 01/22/2022   Procedure: TRANSESOPHAGEAL ECHOCARDIOGRAM (TEE);  Surgeon: Hobart Powell BRAVO, MD;  Location: [facility] ENDOSCOPY;  Service: Cardiovascular;  Laterality: N/A;   TUBAL LIGATION       Social History:   reports that she quit smoking about 4 years ago. Her smoking use included cigarettes. She started smoking about 58 years ago. She has a 13.6 pack-year smoking history. She has been exposed to tobacco smoke. She has never used smokeless tobacco. She reports that she does not currently use alcohol. She reports that she does not use drugs.   Family History:  Her family history includes CAD (age of onset: 14) in her brother; Cancer in her brother, father, and sister; Diabetes in her brother; Heart attack  (age of onset: 58) in her mother; Hypothyroidism in her mother and sister.   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 Authorizing Provider  acetaminophen  (TYLENOL ) 500 MG tablet Take 500 mg by mouth as needed for moderate pain.  [provider]  ALPRAZolam  (XANAX ) 1 MG tablet Take 1 mg by mouth 4 (four) times daily as needed for anxiety.    [provider]  amiodarone  (PACERONE ) 200 MG tablet Take 0.5 tablets (100 mg total) by mouth daily. 10/23/23   Lavona Agent, MD  diclofenac  Sodium (VOLTAREN ) 1 % GEL Apply 2 g topically 4 (four) times daily as needed (pain).    [provider]  docusate sodium  (COLACE) 100 MG capsule Take 1 capsule (100 mg total) by mouth 2 (two) times daily as needed for mild constipation. 04/02/23   Sherrill Cable Latif, DO  ELIQUIS  5 MG TABS tablet Take 1 tablet (5 mg total) by mouth 2 (two) times daily. 12/15/23   Lavona Agent, MD  furosemide  (LASIX ) 20 MG tablet Take 1 tablet (20 mg total) by mouth as needed. 01/20/24 02/19/24  Meng, Hao, PA  loperamide  (IMODIUM  A-D) 2 MG tablet Take 2 mg by mouth 4 (four) times daily as needed for diarrhea or loose stools.    [provider]  losartan  (COZAAR ) 25 MG tablet Take 1 tablet (25 mg total) by mouth daily. 02/10/24   Meng, Hao, PA  methimazole  (TAPAZOLE ) 5 MG tablet Take 0.5 tablets (2.5 mg total) by mouth daily. 11/20/23   Thapa, [place], MD  metoprolol  succinate (TOPROL -XL) 100 MG 24 hr tablet Take 100 mg by mouth daily. Take with or immediately following a meal.    [provider]  Multiple Vitamin (MULTIVITAMIN WITH MINERALS) TABS tablet Take 1 tablet by mouth daily.     [provider]  oxyCODONE -acetaminophen  (PERCOCET) 10-325 MG tablet Take 1 tablet  by mouth every 4 (four) hours as needed for pain. 01/24/22   Brown, Blaine K, PA-C  sodium chloride  1 g tablet Take 1 tablet (1 g total) by mouth as needed. Patient taking differently: Take 1 g by mouth as needed (low sodium). 10/06/23   Meng, Hao, PA  Vitamin D , Ergocalciferol , (DRISDOL) 1.25 MG (50000 UNIT) CAPS capsule Take 50,000 Units by mouth once a week. 12/28/21   [provider]     Critical care time: 54 minutes    The patient is critically ill with multiple organ system failure and requires high complexity decision making for assessment and support, frequent evaluation and titration of therapies, advanced monitoring, review of radiographic studies and interpretation of complex data.   Critical Care Time devoted to patient care services, exclusive of separately billable procedures, described in this note is 54 minutes.  Rexene LOISE Blush, PA-C [facility] 02/29/24 1:58 PM  Please see [email or web address] for pager details.  From 7A-7P if no response, please call [phone] After hours, please call ELink [phone]

## 2024-02-29 NOTE — Consult Note (Signed)
 NAMESaralee Marshall, MRN:  [id number], DOB:  May 11, 1951, LOS: 1 ADMISSION DATE:  02/28/2024, CONSULTATION DATE:  02/29/24 REFERRING MD:  TRH, CHIEF COMPLAINT:  shortness of breath  History of Present Illness:  73 yo female presented with sob and b/l le edema, confusion, swelling of the right elbow since after a fall approximately 1 month ago. Pt was additionally recently admitted the end of June for similar symptoms with heart failure, pleural effusions/ pneumonia and subsequent hypotension requiring low dose vasopressors for adequate diuresis.   Today she was noted to be hypothermic to 95.6, tachypneic and hypoxic with sats in 70's. She was placed on NIV at that time to support her respiratory status. She was noted to have CM with vascular congestion and pleural effusions on cxr. Pt was also increasingly altered and somnolent and with her chronic use of a/c was sent for cth (which proved to be without acute abnormality). TRH had been asked to admit pt. She was given 20 mg lasix  for diuresis and admitted to progressive. However, which her persistent encephalopathy and concern for continued decline CCM was asked to eval. Pt was unable to provide a full review of symptoms and all information was obtained from chart review.   Ccm has been asked to eval with pt's worsening mental status, increasing somnolence and need for NIV.   Upon my eval pt was drowsy, on NIV. BP stable NOT on vasopressors. She was arousable and oriented to self and place.   Pertinent  Medical History  Anxiety Afib, on chronic eliquis  Combined systolic/diastolic heart failure Arthritis ckd3a  Significant Hospital Events: Including procedures, antibiotic start and stop dates in addition to other pertinent events   Admitted to progressive care 8/18 with TRH Transferred to ICU 2/2 worsening mentation 8/18  Interim History / Subjective:    Objective    Blood pressure 96/70, pulse 66, temperature (!) 95.4 F (35.2 C),  temperature source Rectal, resp. rate 17, height 5' 2 (1.575 m), weight 53.1 kg, SpO2 100%.    FiO2 (%):  [28 %-100 %] 60 %   Intake/Output Summary (Last 24 hours) at 02/29/2024 0241 Last data filed at 02/28/2024 2231 Gross per 24 hour  Intake 50 ml  Output 250 ml  Net -200 ml   Filed Weights   02/28/24 1750  Weight: 53.1 kg    Examination: General: arousable on NIV, reclining comfortably in bed HENT: ncat, NIV mask in place EOMI, perrla,  Lungs: bilateral rales Cardiovascular: irreg rhythm but rate 70-80's Abdomen: soft, nt, nd sb+ Extremities: +anasarca. Spontaneously moves all 4 extremities, numerous skin lesions on feet and neck with some erythema Neuro: arousable. Oriented to place, birthdate and self GU: deferred  Resolved problem list   Assessment and Plan  Acute hypoxic/hypercarbic resp failure Pulmonary edema with pleural effusions Bibasilar pneumonia Chronic afib on a/c Acute metabolic encephalopathy Acute on chronic combined HF(systolic/diastolic) Hyponatremia Aki Lactic acidosis, improving Leukopenia -titrate NIV as pt tolerates. Appears that her mentation is improving with this treatment.  -cth negative -check tsh/free t4 -monitor I/O -limited echo for L and R heart function -continued diuresis -empiric abx -f/u cx and de-escalate as able.      Best Practice (right click and Reselect all SmartList Selections daily)   Diet/type: NPO w/ oral meds DVT prophylaxis LMWH Pressure ulcer(s): present on admission  GI prophylaxis: PPI Lines: N/A Foley:  Yes, and it is still needed Code Status:  full code Last date of multidisciplinary goals of care discussion [--]  Labs   CBC: Recent Labs  Lab 02/28/24 1825  WBC 3.8*  NEUTROABS 2.7  HGB 12.9  HCT 43.3  MCV 86.4  PLT 180    Basic Metabolic Panel: Recent Labs  Lab 02/28/24 1825  NA 133*  K 4.3  CL 87*  CO2 30  GLUCOSE 128*  BUN 30*  CREATININE 1.68*  CALCIUM 9.6  MG 2.1    GFR: Estimated Creatinine Clearance: 23.2 mL/min (A) (by C-G formula based on SCr of 1.68 mg/dL (H)). Recent Labs  Lab 02/28/24 1825 02/28/24 1832 02/28/24 2041  WBC 3.8*  --   --   LATICACIDVEN  --  2.8* 2.5*    Liver Function Tests: Recent Labs  Lab 02/28/24 1825  AST 31  ALT 14  ALKPHOS 135*  BILITOT 1.4*  PROT 7.0  ALBUMIN  1.9*   No results for input(s): LIPASE, AMYLASE in the last 168 hours. No results for input(s): AMMONIA in the last 168 hours.  ABG    Component Value Date/Time   PHART 7.48 (H) 01/03/2024 1603   PCO2ART 42 01/03/2024 1603   PO2ART 111 (H) 01/03/2024 1603   HCO3 38.5 (H) 02/29/2024 0107   ACIDBASEDEF 2.4 (H) 12/31/2023 0430   O2SAT 62.1 02/29/2024 0107     Coagulation Profile: No results for input(s): INR, PROTIME in the last 168 hours.  Cardiac Enzymes: No results for input(s): CKTOTAL, CKMB, CKMBINDEX, TROPONINI in the last 168 hours.  HbA1C: Hgb A1c MFr Bld  Date/Time Value Ref Range Status  08/16/2023 05:53 AM 6.1 (H) 4.8 - 5.6 % Final    Comment:    (NOTE) Pre diabetes:          5.7%-6.4%  Diabetes:              >6.4%  Glycemic control for   <7.0% adults with diabetes   03/12/2023 03:25 AM 6.1 (H) 4.8 - 5.6 % Final    Comment:    (NOTE) Pre diabetes:          5.7%-6.4%  Diabetes:              >6.4%  Glycemic control for   <7.0% adults with diabetes     CBG: No results for input(s): GLUCAP in the last 168 hours.  Review of Systems:   As per HPI  Past Medical History:  She,  has a past medical history of Anxiety, Atrial fibrillation (HCC), Family history of adverse reaction to anesthesia, Hip fracture (HCC) (01/18/2022), Hypertension, Rheumatoid arthritis(714.0), and Subclinical hyperthyroidism.   Surgical History:   Past Surgical History:  Procedure Laterality Date   Arm surgery     fracture, right arm   CARDIOVERSION N/A 08/10/2014   Procedure: CARDIOVERSION;  Surgeon: Leim VEAR Moose, MD;  Location: [facility] ENDOSCOPY;  Service: Cardiovascular;  Laterality: N/A;   CARDIOVERSION N/A 01/22/2022   Procedure: CARDIOVERSION;  Surgeon: Hobart Powell BRAVO, MD;  Location: [facility] ENDOSCOPY;  Service: Cardiovascular;  Laterality: N/A;   CATARACT EXTRACTION     DIRECT LARYNGOSCOPY N/A 09/02/2019   Procedure: DIRECT LARYNGOSCOPY WITH BIOPSY;  Surgeon: Ethyl Lonni BRAVO, MD;  Location: [facility];  Service: ENT;  Laterality: N/A;   HIP ARTHROPLASTY Right 01/19/2022   Procedure: ARTHROPLASTY BIPOLAR HIP (HEMIARTHROPLASTY);  Surgeon: Josefina Chew, MD;  Location: [facility] ORS;  Service: Orthopedics;  Laterality: Right;   IR GASTROSTOMY TUBE MOD SED  10/17/2019   IR GASTROSTOMY TUBE REMOVAL  01/03/2020   TEE WITHOUT CARDIOVERSION N/A 01/22/2022   Procedure: TRANSESOPHAGEAL  ECHOCARDIOGRAM (TEE);  Surgeon: Hobart Powell BRAVO, MD;  Location: [facility] ENDOSCOPY;  Service: Cardiovascular;  Laterality: N/A;   TUBAL LIGATION       Social History:   reports that she quit smoking about 4 years ago. Her smoking use included cigarettes. She started smoking about 58 years ago. She has a 13.6 pack-year smoking history. She has been exposed to tobacco smoke. She has never used smokeless tobacco. She reports that she does not currently use alcohol. She reports that she does not use drugs.   Family History:  Her family history includes CAD (age of onset: 80) in her brother; Cancer in her brother, father, and sister; Diabetes in her brother; Heart attack (age of onset: 25) in her mother; Hypothyroidism in her mother and sister.   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 Authorizing Provider  acetaminophen  (TYLENOL ) 500 MG tablet Take 500 mg by mouth as needed for moderate  pain.    [provider]  ALPRAZolam  (XANAX ) 1 MG tablet Take 1 mg by mouth 4 (four) times daily as needed for anxiety.    [provider]  amiodarone  (PACERONE ) 200 MG tablet Take 0.5 tablets (100 mg total) by mouth daily. 10/23/23   Lavona Agent, MD  diclofenac  Sodium (VOLTAREN ) 1 % GEL Apply 2 g topically 4 (four) times daily as needed (pain).    [provider]  docusate sodium  (COLACE) 100 MG capsule Take 1 capsule (100 mg total) by mouth 2 (two) times daily as needed for mild constipation. 04/02/23   Sheikh, Omair Latif, DO  ELIQUIS  5 MG TABS tablet Take 1 tablet (5 mg total) by mouth 2 (two) times daily. 12/15/23   Lavona Agent, MD  furosemide  (LASIX ) 20 MG tablet Take 1 tablet (20 mg total) by mouth as needed. 01/20/24 02/19/24  Meng, Hao, PA  loperamide  (IMODIUM  A-D) 2 MG tablet Take 2 mg by mouth 4 (four) times daily as needed for diarrhea or loose stools.    [provider]  losartan  (COZAAR ) 25 MG tablet Take 1 tablet (25 mg total) by mouth daily. 02/10/24   Meng, Hao, PA  methimazole  (TAPAZOLE ) 5 MG tablet Take 0.5 tablets (2.5 mg total) by mouth daily. 11/20/23   Thapa, [place], MD  metoprolol  succinate (TOPROL -XL) 100 MG 24 hr tablet Take 100 mg by mouth daily. Take with or immediately following a meal.    [provider]  Multiple Vitamin (MULTIVITAMIN WITH MINERALS) TABS tablet Take 1 tablet by mouth daily.     [provider]  oxyCODONE -acetaminophen  (PERCOCET) 10-325 MG tablet Take 1 tablet by mouth every 4 (four) hours as needed for pain. 01/24/22   Brown, Blaine K, PA-C  sodium chloride  1 g tablet Take 1 tablet (1 g total) by mouth as needed. Patient taking differently: Take 1 g by mouth as needed (low sodium). 10/06/23   Meng, Hao, PA  Vitamin D , Ergocalciferol , (DRISDOL) 1.25 MG (50000 UNIT) CAPS capsule Take 50,000 Units by mouth once a week. 12/28/21   [provider]     Critical care time: 

## 2024-02-29 NOTE — Progress Notes (Signed)
   02/29/24 0303  BiPAP/CPAP/SIPAP  BiPAP/CPAP/SIPAP Pt Type Adult  BiPAP/CPAP/SIPAP V60  Mask Type Full face mask  Dentures removed? Not applicable  Mask Size Small  Set Rate 18 breaths/min  Respiratory Rate 22 breaths/min  IPAP 14 cmH20  EPAP 5 cmH2O  Flow Rate 0 lpm  Minute Ventilation 7.3  Leak 10  Peak Inspiratory Pressure (PIP) 19  Tidal Volume (Vt) 716  Patient Home Machine No  Patient Home Mask No  Patient Home Tubing No  Auto Titrate No  Press High Alarm 30 cmH2O  Press Low Alarm 5 cmH2O  Device Plugged into RED Power Outlet Yes  Oxygen  Percent 60 %

## 2024-02-29 NOTE — Progress Notes (Signed)
 eLink Physician-Brief Progress Note Patient Name: Rhonda Marshall DOB: May 22, 1951 MRN: [id number]   Date of Service  02/29/2024  HPI/Events of Note  Chronically ill woman with HFrEF & CKD adm with sepsis syndrome , hypothermic & leucopenic, due to bibasal pna , placed on BiPAP Encephalopathic-described as obtunded  eICU Interventions  Awake on camera &able to protect airway , head CT neg Continue BiPAP     Intervention Category Evaluation Type: New Patient Evaluation  Rhonda Marshall V. Laurent Cargile 02/29/2024, 3:24 AM

## 2024-02-29 NOTE — Consult Note (Signed)
 CARDIOLOGY CONSULT NOTE       Patient ID: Rhonda Marshall MRN: [id number] DOB/AGE: 1951/03/22 73 y.o.  Admit date: 02/28/2024 Referring Physician: [name] Primary Physician: [name] LABOR, MD Primary Cardiologist: Hochrein Reason for Consultation: CHF  Principal Problem:   CHF exacerbation [facility]) Active Problems:   AKI (acute kidney injury) (HCC)   Acute hypoxemic respiratory failure (HCC)   Severe sepsis (HCC)   Acute encephalopathy   HPI:  73 y.o. admitted with encephalopathy, edema, dyspnea and persistent swelling right elbow after fall a month ago. She had complicated hospitalization June of this year with sepsis, shock multifocal pneumonia, esophageal dysmotility and aspiration. She was Rx with for 6 weeks antibiotics for Enterococcus [name] bacteremia. She was hypothermic on admission 95.6. with sats 77% on RA. BNP 3431 with Cr 1.68 and CO2 66. CXR with small bilateral effuisons. Got one dose of lasix  20 mg in ER. Last admission had issues diuresing due to hypotension. GDMT for non ischemic DCM limited by low BP and renal function. TTE today stable EF 30-35% abnormal septal motion from LBBB mild RV reduction., severe LAE from chronic afib trivial MR AV sclerosis doubt SBE/vegetation. This am she is alert but hard to understand. Afib rates are fine. Sats on oxygen  ok. No chest pain.   ROS All other systems reviewed and negative except as noted above  Past Medical History:  Diagnosis Date   Anxiety    Atrial fibrillation (HCC)    newly diagnosed 10/2010   Family history of adverse reaction to anesthesia    Sister's BP drops    Hip fracture (HCC) 01/18/2022   Hypertension    Rheumatoid arthritis(714.0)    Subclinical hyperthyroidism     Family History  Problem Relation Age of Onset   Heart attack Mother 79       pacemaker age 36   Hypothyroidism Mother    Cancer Father        colon and prostate   Hypothyroidism Sister    Cancer Sister    Diabetes Brother    CAD  Brother 87   Cancer Brother        Renal    Social History   Socioeconomic History   Marital status: Widowed    Spouse name: Not on file   Number of children: 1   Years of education: Not on file   Highest education level: Not on file  Occupational History   Occupation: laborer    Comment: [employer]   Occupation: retired  Tobacco Use   Smoking status: Former    Current packs/day: 0.00    Average packs/day: 0.3 packs/day for 54.2 years (13.6 ttl pk-yrs)    Types: Cigarettes    Start date: 62    Quit date: 10/2019    Years since quitting: 4.3    Passive exposure: Past   Smokeless tobacco: Never  Vaping Use   Vaping status: Never Used  Substance and Sexual Activity   Alcohol use: Not Currently    Alcohol/week: 0.0 standard drinks of alcohol   Drug use: No   Sexual activity: Not on file  Other Topics Concern   Not on file  Social History Narrative   Not on file   Social Drivers of Health   Financial Resource Strain: Low Risk  (08/18/2023)   Overall Financial Resource Strain (CARDIA)    Difficulty of Paying Living Expenses: Not very hard  Food Insecurity: No Food Insecurity (12/31/2023)   Hunger Vital Sign    Worried About  Running Out of Food in the Last Year: Never true    Ran Out of Food in the Last Year: Never true  Transportation Needs: No Transportation Needs (12/31/2023)   PRAPARE - Administrator, Civil Service (Medical): No    Lack of Transportation (Non-Medical): No  Physical Activity: Not on file  Stress: Not on file  Social Connections: Moderately Integrated (12/31/2023)   Social Connection and Isolation Panel    Frequency of Communication with Friends and Family: Once a week    Frequency of Social Gatherings with Friends and Family: More than three times a week    Attends Religious Services: 1 to 4 times per year    Active Member of [employer] or Organizations: No    Attends Banker Meetings: 1 to 4 times per year    Marital  Status: Widowed  Intimate Partner Violence: Not At Risk (12/31/2023)   Humiliation, Afraid, Rape, and Kick questionnaire    Fear of Current or Ex-Partner: No    Emotionally Abused: No    Physically Abused: No    Sexually Abused: No    Past Surgical History:  Procedure Laterality Date   Arm surgery     fracture, right arm   CARDIOVERSION N/A 08/10/2014   Procedure: CARDIOVERSION;  Surgeon: Leim VEAR Moose, MD;  Location: [facility] ENDOSCOPY;  Service: Cardiovascular;  Laterality: N/A;   CARDIOVERSION N/A 01/22/2022   Procedure: CARDIOVERSION;  Surgeon: Hobart Powell BRAVO, MD;  Location: [facility] ENDOSCOPY;  Service: Cardiovascular;  Laterality: N/A;   CATARACT EXTRACTION     DIRECT LARYNGOSCOPY N/A 09/02/2019   Procedure: DIRECT LARYNGOSCOPY WITH BIOPSY;  Surgeon: Ethyl Lonni BRAVO, MD;  Location: [facility];  Service: ENT;  Laterality: N/A;   HIP ARTHROPLASTY Right 01/19/2022   Procedure: ARTHROPLASTY BIPOLAR HIP (HEMIARTHROPLASTY);  Surgeon: Josefina Chew, MD;  Location: [facility] ORS;  Service: Orthopedics;  Laterality: Right;   IR GASTROSTOMY TUBE MOD SED  10/17/2019   IR GASTROSTOMY TUBE REMOVAL  01/03/2020   TEE WITHOUT CARDIOVERSION N/A 01/22/2022   Procedure: TRANSESOPHAGEAL ECHOCARDIOGRAM (TEE);  Surgeon: Hobart Powell BRAVO, MD;  Location[facility];  Service: Cardiovascular;  Laterality: N/A;   TUBAL LIGATION        Current Facility-Administered Medications:    0.9 %  sodium chloride  infusion, , Intravenous, PRN, Rathore, Vasundhra, MD, Last Rate: 10 mL/hr at 02/29/24 0942, Infusion Verify at 02/29/24 0942   acetaminophen  (TYLENOL ) tablet 650 mg, 650 mg, Oral, Q6H PRN **OR** acetaminophen  (TYLENOL ) suppository 650 mg, 650 mg, Rectal, Q6H PRN, [name] [name], MD   Chlorhexidine  Gluconate Cloth 2 % PADS 6 each, 6 each, Topical, Q0600, Rathore, Vasundhra, MD, 6 each at 02/29/24 0315   enoxaparin  (LOVENOX ) injection 30 mg, 30 mg, Subcutaneous, Q24H, Layman, Jessica, DO    [START ON 03/01/2024] linezolid  (ZYVOX ) IVPB 600 mg, 600 mg, Intravenous, Q12H, [name] [name], MD   Oral care mouth rinse, 15 mL, Mouth Rinse, 4 times per day, [name] [name], MD   Oral care mouth rinse, 15 mL, Mouth Rinse, PRN, [name] [name], MD   pantoprazole  (PROTONIX ) injection 40 mg, 40 mg, Intravenous, Q24H, Marshall, Jessica, DO, 40 mg at 02/29/24 0412   piperacillin -tazobactam (ZOSYN ) IVPB 3.375 g, 3.375 g, Intravenous, Q8H, Lilliston, Andrea M, RPH, Stopped at 02/29/24 0829   sodium chloride  flush (NS) 0.9 % injection 10-40 mL, 10-40 mL, Intracatheter, PRN, Rathore, Vasundhra, MD  Chlorhexidine  Gluconate Cloth  6 each Topical Q0600   enoxaparin  (LOVENOX ) injection  30 mg Subcutaneous Q24H  mouth rinse  15 mL Mouth Rinse 4 times per day   pantoprazole  (PROTONIX ) IV  40 mg Intravenous Q24H    sodium chloride  10 mL/hr at 02/29/24 0942   [START ON 03/01/2024] linezolid  (ZYVOX ) IV     piperacillin -tazobactam (ZOSYN )  IV Stopped (02/29/24 0829)    Physical Exam: Blood pressure (!) 92/40, pulse 85, temperature 99.5 F (37.5 C), resp. rate 18, height 5' 2 (1.575 m), weight 61.7 kg, SpO2 94%.    Elderly female Neuro non focal but hard to understand speech Basilar crackles JVP elevated with V wave Plus 2 LE edema with some abdominal wall edema No murmur Art line left radial  Labs:   Lab Results  Component Value Date   WBC 3.6 (L) 02/29/2024   HGB 12.0 02/29/2024   HCT 39.7 02/29/2024   MCV 87.3 02/29/2024   PLT 150 02/29/2024    Recent Labs  Lab 02/29/24 0535  NA 134*  K 3.8  CL 90*  CO2 32  BUN 30*  CREATININE 1.65*  CALCIUM 9.0  PROT 5.4*  BILITOT 1.1  ALKPHOS 105  ALT 12  AST 22  GLUCOSE 96   Lab Results  Component Value Date   CKTOTAL 81 01/18/2022   CKMB 0.9 11/06/2010   TROPONINI <0.01        NO INDICATION OF MYOCARDIAL INJURY. 11/06/2010   No results found for: CHOL No results found for: HDL No results found for: LDLCALC No  results found for: TRIG No results found for: [facility] Lab Results  Component Value Date   LDLDIRECT 117 (H) 09/20/2008      Radiology: ECHOCARDIOGRAM LIMITED Result Date: 02/29/2024    ECHOCARDIOGRAM LIMITED REPORT   Patient Name:   ANNALYSE Ennis Date of Exam: 02/29/2024 Medical Rec #:  [id number]        Height:       62.0 in Accession #:    [id number]       Weight:       136.0 lb Date of Birth:  1951/03/05        BSA:          1.623 m Patient Age:    73 years         BP:           73/34 mmHg Patient Gender: F                HR:           83 bpm. Exam Location:  Inpatient Procedure: 2D Echo, Limited Echo, Cardiac Doppler, Color Doppler and Strain            Analysis (Both Spectral and Color Flow Doppler were utilized during            procedure). Indications:     CHF  History:         Patient has prior history of Echocardiogram examinations, most                  recent 01/01/2024. CHF, Arrythmias:Atrial Fibrillation,                  Signs/Symptoms:Shortness of Breath and Edema; Risk                  Factors:Hypertension.  Sonographer:     Therisa Crouch Referring Phys:  [id number] JESSICA MARSHALL Diagnosing Phys: Ezra McleanMD IMPRESSIONS  1. Left ventricular ejection fraction, by estimation, is 30 to 35%. Left ventricular ejection fraction by 2D MOD  biplane is 33.5 %. The left ventricle has moderately decreased function. The left ventricle demonstrates global hypokinesis with septal-lateral dyssynchrony suggestive of LBBB. There is mild concentric left ventricular hypertrophy. Left ventricular diastolic parameters are indeterminate.  2. Right ventricular systolic function is mildly reduced. The right ventricular size is normal. There is normal pulmonary artery systolic pressure. The estimated right ventricular systolic pressure is 25.0 mmHg.  3. Left atrial size was severely dilated.  4. Right atrial size was mildly dilated.  5. There is mild calcification of the aortic valve. Aortic valve  regurgitation is not visualized. No aortic stenosis is present. 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  6. The mitral valve is normal in structure. Trivial mitral valve regurgitation. No evidence of mitral stenosis.  7. The inferior vena cava is normal in size with <50% respiratory variability, suggesting right atrial pressure of 8 mmHg.  8. Left pleural effusion noted. A small pericardial effusion is present.  9. The patient was in atrial fibrillation. FINDINGS  Left Ventricle: Left ventricular ejection fraction, by estimation, is 30 to 35%. Left ventricular ejection fraction by 2D MOD biplane is 33.5 %. The left ventricle has moderately decreased function. The left ventricle demonstrates global hypokinesis. The average left ventricular global longitudinal strain is -8.6 %. Strain was performed and the global longitudinal strain is abnormal. The left ventricular internal cavity size was normal in size. There is mild concentric left ventricular hypertrophy. Left ventricular diastolic parameters are indeterminate. Right Ventricle: The right ventricular size is normal. Right ventricular systolic function is mildly reduced. There is normal pulmonary artery systolic pressure. The tricuspid regurgitant velocity is 2.06 m/s, and with an assumed right atrial pressure of  8 mmHg, the estimated right ventricular systolic pressure is 25.0 mmHg. Left Atrium: Left atrial size was severely dilated. Right Atrium: Right atrial size was mildly dilated. Pericardium: Left pleural effusion noted. A small pericardial effusion is present. Mitral Valve: The mitral valve is normal in structure. Trivial mitral valve regurgitation. No evidence of mitral valve stenosis. Tricuspid Valve: The tricuspid valve is normal in structure. Tricuspid valve regurgitation is mild. Aortic Valve: There is mild  calcification of the aortic valve. Aortic valve regurgitation is not visualized. No aortic stenosis is present. Venous: The inferior vena cava is normal in size with less than 50% respiratory variability, suggesting right atrial pressure of 8 mmHg. IAS/Shunts: No atrial level shunt detected by color flow Doppler. LEFT VENTRICLE PLAX 2D                        Biplane EF (MOD) LVIDd:         3.90 cm         LV Biplane EF:   Left LVIDs:         3.40 cm                          ventricular LV PW:         1.10 cm                          ejection LV IVS:        1.20 cm  fraction by                                                 2D MOD                                                 biplane is LV Volumes (MOD)                                33.5 %. LV vol d, MOD    80.7 ml A2C: LV vol d, MOD    76.4 ml       2D Longitudinal A4C:                           Strain LV vol s, MOD    50.8 ml       2D Strain GLS   -8.6 % A2C:                           Avg: LV vol s, MOD    53.6 ml A4C: LV SV MOD A2C:   29.9 ml LV SV MOD A4C:   76.4 ml LV SV MOD BP:    27.0 ml RIGHT VENTRICLE          IVC RV Basal diam:  3.80 cm  IVC diam: 1.90 cm RV Mid diam:    2.80 cm LEFT ATRIUM              Index        RIGHT ATRIUM           Index LA diam:        3.80 cm  2.34 cm/m   RA Area:     18.30 cm LA Vol (A2C):   109.0 ml 67.17 ml/m  RA Volume:   47.40 ml  29.21 ml/m LA Vol (A4C):   103.0 ml 63.47 ml/m LA Biplane Vol: 106.0 ml 65.32 ml/m  TRICUSPID VALVE TR Peak grad:   17.0 mmHg TR Vmax:        206.00 cm/s Dalton McleanMD Electronically signed by Ezra Kanner Signature Date/Time: 02/29/2024/8:17:39 AM    Final (Updated)    CT HEAD WO CONTRAST ( ) Result Date: 02/29/2024 CLINICAL DATA:  Delirium EXAM: CT HEAD WITHOUT CONTRAST TECHNIQUE: Contiguous axial images were obtained from the base of the skull through the vertex without intravenous contrast. RADIATION DOSE REDUCTION: This exam was performed according to  the departmental dose-optimization program which includes automated exposure control, adjustment of the mA and/or kV according to patient size and/or use of iterative reconstruction technique. COMPARISON:  CT head 01/03/2024 FINDINGS: Brain: No intracranial hemorrhage, mass effect, or evidence of acute infarct. No hydrocephalus. No extra-axial fluid collection. Age-commensurate cerebral atrophy and chronic small vessel ischemic disease. Vascular: No hyperdense vessel. Intracranial arterial calcification. Skull: No fracture or focal lesion. Sinuses/Orbits: No acute finding. Other: None. IMPRESSION: No acute intracranial abnormality. Electronically Signed   By: Norman Gatlin M.D.   On: 02/29/2024 03:04   DG Elbow 2 Views Right Result Date: 02/28/2024 CLINICAL DATA:  Pain. EXAM: RIGHT  ELBOW - 2 VIEW COMPARISON:  None Available. FINDINGS: There is no evidence of fracture, dislocation, or definite joint effusion. There is no evidence of arthropathy or other focal bone abnormality. No erosive change. Prominent generalized soft tissue edema. IMPRESSION: Prominent generalized soft tissue edema. No acute osseous abnormality. Electronically Signed   By: Andrea Gasman M.D.   On: 02/28/2024 18:44   DG Chest Port 1 View Result Date: 02/28/2024 CLINICAL DATA:  Shortness of breath. EXAM: PORTABLE CHEST 1 VIEW COMPARISON:  Most recent radiograph 01/07/2024 FINDINGS: Increased bilateral pleural effusions, partially loculated on the right. Associated bibasilar opacities. The heart is enlarged but stable. Slight vascular congestion. No pneumothorax. IMPRESSION: 1. Increased bilateral pleural effusions, partially loculated on the right. Associated bibasilar opacities. 2. Cardiomegaly with slight vascular congestion. Electronically Signed   By: Andrea Gasman M.D.   On: 02/28/2024 18:44    EKG: afib LBBB rate 88   ASSESSMENT AND PLAN:   AFib:  rates are fine beta blocker held due to low BP was on eliquis  prior to  admission. Would cover with heparin  /lovenox  CHF:  non ischemic with LBBB EF 30-35% GDMT limited by low BP in setting of ? Recurrent sepsis. She is volume overloaded with effusions, JVP elevation, abdominal wall/LE edema BNP 3431. K 3.8 Cr 1.65 ARB held. BP too low to consider milrinone. Per CCM consider central/PIC line to monitor CVP and Coox. In setting of ? Recurrent sepsis vasopressor Levo/with Lasix  iv Sepsis:  ? Recurrent aspiration with esophageal dysmotility. Procalcitionin negative Lactic acid 2.8 improving. On Zosyn . BC;s pending Has arterial line placed this am in right radial Thyroid :  TSH 11 free T4 normal No amiodarone  was on Tapazole  prior to admission   ? Need for goals of care with patient and family.   SignedBETHA Maude Emmer 02/29/2024, 10:19 AM

## 2024-02-29 NOTE — Progress Notes (Signed)
 Due to pt being a difficult lab draw, only one vial of blood cultures was obtained. There was a miscommunication between RN and lab and antibiotics were started before second vial of cultures were obtained. Second vial drawn from newly placed midline catheter and sent to lab. Dr. Jude aware.

## 2024-02-29 NOTE — Plan of Care (Signed)
  Problem: Clinical Measurements: Goal: Will remain free from infection Outcome: Progressing Goal: Diagnostic test results will improve Outcome: Progressing Goal: Respiratory complications will improve Outcome: Progressing Goal: Cardiovascular complication will be avoided Outcome: Progressing   Problem: Elimination: Goal: Will not experience complications related to urinary retention Outcome: Progressing

## 2024-03-01 DIAGNOSIS — Z8679 Personal history of other diseases of the circulatory system: Secondary | ICD-10-CM

## 2024-03-01 DIAGNOSIS — I5021 Acute systolic (congestive) heart failure: Secondary | ICD-10-CM

## 2024-03-01 DIAGNOSIS — I482 Chronic atrial fibrillation, unspecified: Secondary | ICD-10-CM | POA: Diagnosis not present

## 2024-03-01 DIAGNOSIS — I330 Acute and subacute infective endocarditis: Secondary | ICD-10-CM

## 2024-03-01 DIAGNOSIS — I4891 Unspecified atrial fibrillation: Secondary | ICD-10-CM

## 2024-03-01 DIAGNOSIS — R578 Other shock: Secondary | ICD-10-CM

## 2024-03-01 DIAGNOSIS — N189 Chronic kidney disease, unspecified: Secondary | ICD-10-CM

## 2024-03-01 LAB — COMPREHENSIVE METABOLIC PANEL WITH GFR
ALT: 12 U/L (ref 0–44)
AST: 25 U/L (ref 15–41)
Albumin: 1.7 g/dL — ABNORMAL LOW (ref 3.5–5.0)
Alkaline Phosphatase: 113 U/L (ref 38–126)
Anion gap: 11 (ref 5–15)
BUN: 31 mg/dL — ABNORMAL HIGH (ref 8–23)
CO2: 33 mmol/L — ABNORMAL HIGH (ref 22–32)
Calcium: 8.8 mg/dL — ABNORMAL LOW (ref 8.9–10.3)
Chloride: 92 mmol/L — ABNORMAL LOW (ref 98–111)
Creatinine, Ser: 1.72 mg/dL — ABNORMAL HIGH (ref 0.44–1.00)
GFR, Estimated: 31 mL/min — ABNORMAL LOW (ref >60)
Glucose, Bld: 78 mg/dL (ref 70–99)
Potassium: 3.6 mmol/L (ref 3.5–5.1)
Sodium: 136 mmol/L (ref 135–145)
Total Bilirubin: 1 mg/dL (ref 0.0–1.2)
Total Protein: 5.5 g/dL — ABNORMAL LOW (ref 6.5–8.1)

## 2024-03-01 LAB — APTT
aPTT: 189 s (ref 24–36)
aPTT: 88 s — ABNORMAL HIGH (ref 24–36)

## 2024-03-01 LAB — CBC
HCT: 33.1 % — ABNORMAL LOW (ref 36.0–46.0)
Hemoglobin: 10.6 g/dL — ABNORMAL LOW (ref 12.0–15.0)
MCH: 27.3 pg (ref 26.0–34.0)
MCHC: 32 g/dL (ref 30.0–36.0)
MCV: 85.3 fL (ref 80.0–100.0)
Platelets: 143 K/uL — ABNORMAL LOW (ref 150–400)
RBC: 3.88 MIL/uL (ref 3.87–5.11)
RDW: 22.5 % — ABNORMAL HIGH (ref 11.5–15.5)
WBC: 3.6 K/uL — ABNORMAL LOW (ref 4.0–10.5)
nRBC: 0 % (ref 0.0–0.2)

## 2024-03-01 LAB — HEPARIN LEVEL (UNFRACTIONATED): Heparin Unfractionated: >1.1 [IU]/mL — ABNORMAL HIGH (ref 0.30–0.70)

## 2024-03-01 MED ORDER — PIPERACILLIN-TAZOBACTAM 3.375 G IVPB
3.3750 g | Freq: Three times a day (TID) | INTRAVENOUS | Status: AC
  Administered 2024-03-01 – 2024-03-07 (×19): 3.375 g via INTRAVENOUS
  Filled 2024-03-01 (×19): qty 50

## 2024-03-01 MED ORDER — OXYCODONE HCL 5 MG PO TABS
10.0000 mg | ORAL_TABLET | Freq: Four times a day (QID) | ORAL | Status: AC | PRN
  Administered 2024-03-01 – 2024-03-05 (×8): 10 mg via ORAL
  Filled 2024-03-01 (×9): qty 2

## 2024-03-01 MED ORDER — PIPERACILLIN-TAZOBACTAM 3.375 G IVPB: 3.3750 g | Freq: Three times a day (TID) | INTRAVENOUS | Status: DC

## 2024-03-01 MED ORDER — NOREPINEPHRINE 4 MG/250ML-% IV SOLN
0.0000 ug/min | INTRAVENOUS | Status: DC
  Administered 2024-03-01 – 2024-03-02 (×2): 2 ug/min via INTRAVENOUS
  Filled 2024-03-01 (×2): qty 250

## 2024-03-01 MED ORDER — POTASSIUM CHLORIDE 20 MEQ PO PACK
20.0000 meq | PACK | Freq: Once | ORAL | Status: AC
  Administered 2024-03-01: 20 meq via ORAL
  Filled 2024-03-01: qty 1

## 2024-03-01 MED ORDER — HEPARIN (PORCINE) 25000 UT/250ML-% IV SOLN
800.0000 [IU]/h | INTRAVENOUS | Status: DC
  Administered 2024-03-02: 800 [IU]/h via INTRAVENOUS
  Filled 2024-03-01: qty 250

## 2024-03-01 MED ORDER — LINEZOLID 600 MG/300ML IV SOLN
600.0000 mg | Freq: Two times a day (BID) | INTRAVENOUS | Status: DC
  Administered 2024-03-02 (×2): 600 mg via INTRAVENOUS
  Filled 2024-03-01 (×2): qty 300

## 2024-03-01 MED ORDER — LINEZOLID 600 MG/300ML IV SOLN: 600.0000 mg | Freq: Two times a day (BID) | INTRAVENOUS | Status: DC

## 2024-03-01 MED ORDER — HEPARIN (PORCINE) 25000 UT/250ML-% IV SOLN: 950.0000 [IU]/h | INTRAVENOUS | Status: DC

## 2024-03-01 MED ORDER — SODIUM CHLORIDE 0.9 % IV SOLN: 3.0000 g | Freq: Two times a day (BID) | INTRAVENOUS | Status: DC

## 2024-03-01 NOTE — Progress Notes (Signed)
 PT Cancellation Note  Patient Details Name: Rhonda Marshall MRN: [id number] DOB: 1950-07-22   Cancelled Treatment:    Reason Eval/Treat Not Completed: Patient not medically ready PT orders received, chart reviewed. Per chart & medical team, pt not medically ready for PT evaluation at this time. Will attempt tomorrow.  Richerd Pinal, PT, DPT 03/01/24, 12:24 PM   [name] 03/01/2024, 12:24 PM

## 2024-03-01 NOTE — Progress Notes (Signed)
   02/29/24 2256  BiPAP/CPAP/SIPAP  BiPAP/CPAP/SIPAP Pt Type Adult  BiPAP/CPAP/SIPAP V60  Mask Type Full face mask  Dentures removed? Not applicable  Mask Size Small  Set Rate 18 breaths/min  Respiratory Rate 22 breaths/min  IPAP 14 cmH20  EPAP 5 cmH2O  FiO2 (%) 50 %  Minute Ventilation 6.4  Leak 2  Peak Inspiratory Pressure (PIP) 14  Tidal Volume (Vt) 320  Patient Home Machine No  Patient Home Mask No  Patient Home Tubing No  Auto Titrate No  Press High Alarm 30 cmH2O  Press Low Alarm 5 cmH2O  Device Plugged into RED Power Outlet Yes  Oxygen  Percent 50 %

## 2024-03-01 NOTE — Progress Notes (Signed)
 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     Patient Measurements: Height: 5' 2 (157.5 cm) Weight: 63.5 kg (139 lb 15.9 oz) IBW/kg (Calculated) : 50.1 HEPARIN  DW (KG): 61.7  Vital Signs: Temp: 97.6 F (36.4 C) (08/19 1600) Temp Source: Oral (08/19 1600) BP: 120/70 (08/19 1840) Pulse Rate: 87 (08/19 1840)  Labs: Recent Labs    02/28/24 1825 02/28/24 2028 02/29/24 0419 02/29/24 0535 02/29/24 1233 03/01/24 0440 03/01/24 0730 03/01/24 0751  HGB 12.9  --  12.0  --   --  10.6*  --   --   HCT 43.3  --  39.7  --   --  33.1*  --   --   PLT 180  --  150  --   --  143*  --   --   APTT  --   --   --   --   --   --  189*  --   HEPARINUNFRC  --   --   --   --  >1.10*  --  >1.10*  --   CREATININE 1.68*  --   --  1.65*  --   --   --  1.72*  TROPONINIHS 18* 15  --   --   --   --   --   --     Estimated Creatinine Clearance: 25.1 mL/min (A) (by C-G formula based on SCr of 1.72 mg/dL (H)).   Medical History: Past Medical History:  Diagnosis Date   Anxiety    Atrial fibrillation (HCC)    newly diagnosed 10/2010   Family history of adverse reaction to anesthesia    Sister's BP drops    Hip fracture (HCC) 01/18/2022   Hypertension    Rheumatoid arthritis(714.0)    Subclinical hyperthyroidism     Medications:  Medications Prior to Admission  Medication Sig Dispense Refill Last Dose/Taking   acetaminophen  (TYLENOL ) 500 MG tablet Take 500 mg by mouth every 4 (four) hours as needed for moderate pain (pain score 4-6).   Past Week   ALPRAZolam  (XANAX ) 1 MG tablet Take 1 mg by mouth 3 (three) times daily as needed for anxiety.   02/28/2024   amiodarone  (PACERONE ) 200 MG tablet Take 0.5 tablets (100 mg total) by mouth daily. 45 tablet 3 02/27/2024   diclofenac   Sodium (VOLTAREN ) 1 % GEL Apply 2 g topically 4 (four) times daily as needed (pain).   Past Month   ELIQUIS  5 MG TABS tablet Take 1 tablet (5 mg total) by mouth 2 (two) times daily. 180 tablet 1 02/27/2024   furosemide  (LASIX ) 20 MG tablet Take 1 tablet (20 mg total) by mouth as needed. (Patient taking differently: Take 20 mg by mouth See admin instructions. Take 20mg  (1 tablet) by mouth for 3 days on, then 3 days off, then repeat.)   Past Week   loperamide  (IMODIUM  A-D) 2 MG tablet Take 2 mg by mouth 4 (four) times daily as needed for diarrhea or loose stools.   Unknown   losartan  (COZAAR ) 25 MG tablet Take 1 tablet (25 mg total) by mouth daily. 30 tablet 11 Past Week   methimazole  (TAPAZOLE ) 5 MG tablet Take 0.5 tablets (2.5 mg total) by mouth daily. 45 tablet 3 Past Week   metoprolol  succinate (TOPROL -XL) 100  MG 24 hr tablet Take 50 mg by mouth 2 (two) times daily. Take with or immediately following a meal.   02/27/2024   oxyCODONE -acetaminophen  (PERCOCET) 10-325 MG tablet Take 1 tablet by mouth every 4 (four) hours as needed for pain. (Patient taking differently: Take 1 tablet by mouth 4 (four) times daily.) 30 tablet 0 02/28/2024   sodium chloride  1 g tablet Take 1 tablet (1 g total) by mouth as needed.   Unknown   Vitamin D , Ergocalciferol , (DRISDOL) 1.25 MG (50000 UNIT) CAPS capsule Take 50,000 Units by mouth once a week.   02/22/2024   ampicillin  (OMNIPEN) 2 g injection  (Patient not taking: Reported on 02/29/2024)   Not Taking   cefTRIAXone  (ROCEPHIN ) 10 g injection  (Patient not taking: Reported on 02/29/2024)   Not Taking    Assessment: Pharmacy is consulted to start IV heparin  on 73 yo female diagnosed with afib. Pt PTA med list includes apixaban  5 mg PO BID with last dose being on 8/16 in PM.  - Enoxaparin  30 mg given at 1105 on 8/18 prior to IV heparin  being ordered.   Today, 03/01/24 aPTT 88 sec, therapeutic on heparin  800 units/hr No bleeding or complications reported  Goal of  Therapy:  Heparin  level 0.3-0.7 units/ml aPTT 66-102 sec  Monitor platelets by anticoagulation protocol: Yes   Plan:  Continue heparin  IV infusion at 800 units/hr Confirmatory aPTT in 8 hours Daily aPTT, heparin  level, and CBC   Wanda Hasting PharmD, BCPS [facility] main pharmacy [phone] 03/01/2024 8:15 PM

## 2024-03-01 NOTE — Progress Notes (Signed)
 Core temp 36 C, bear hugger placed on patient

## 2024-03-01 NOTE — TOC Initial Note (Signed)
 Transition of Care [facility]) - Initial/Assessment Note    Patient Details  Name: Rhonda Marshall MRN: [id number] Date of Birth: [birth year]  Transition of Care [facility]) CM/SW Contact:    Jon ONEIDA Anon, RN Phone Number: 03/01/2024, 3:16 PM  Clinical Narrative:                 Pt is from home, living with son and daughter in law. Recently discharged from the hospital on IV ABT. She states her POC is her son Shena Vinluan at [phone]. Pt denies any HH services or DME needs at this time. PT/OT consulted, awaiting any new recommendations. Care Management will continue to follow.  Expected Discharge Plan: Home w Home Health Services Barriers to Discharge: Continued Medical Work up   Patient Goals and CMS Choice Patient states their goals for this hospitalization and ongoing recovery are:: To return home with son and daughter in law CMS Medicare.gov Compare Post Acute Care list provided to:: Patient Choice offered to / list presented to : Patient [place] ownership interest in [facility].provided to:: Patient    Expected Discharge Plan and Services In-house Referral: Clinical Social Work Discharge Planning Services: CM Consult Post Acute Care Choice: Durable Medical Equipment, Home Health Living arrangements for the past 2 months: Single Family Home                 DME Arranged: N/A DME Agency: NA       HH Arranged: NA HH Agency: NA        Prior Living Arrangements/Services Living arrangements for the past 2 months: Single Family Home Lives with:: Adult Children Patient language and need for interpreter reviewed:: Yes Do you feel safe going back to the place where you live?: Yes      Need for Family Participation in Patient Care: Yes (Comment) Care giver support system in place?: Yes (comment) Current home services: DME Criminal Activity/Legal Involvement Pertinent to Current Situation/Hospitalization: No - Comment as needed  Activities of Daily Living       Permission Sought/Granted Permission sought to share information with : Family Supports Permission granted to share information with : Yes, Verbal Permission Granted  Share Information with NAME: Sammantha, Mehlhaff (Son)  [phone]           Emotional Assessment Appearance:: Appears stated age Attitude/Demeanor/Rapport: Engaged Affect (typically observed): Accepting, Appropriate Orientation: : Oriented to Self, Oriented to Place, Oriented to  Time, Oriented to Situation Alcohol / Substance Use: Not Applicable Psych Involvement: No (comment)  Admission diagnosis:  Anasarca [R60.1] CHF exacerbation (HCC) [I50.9] Acute on chronic congestive heart failure, unspecified heart failure type (HCC) [I50.9] Acute renal failure superimposed on stage 3a chronic kidney disease, unspecified acute renal failure type (HCC) [N17.9, N18.31] Patient Active Problem List   Diagnosis Date Noted   Aortic valve vegetation 03/01/2024   H/O endocarditis 03/01/2024   Acute encephalopathy 02/29/2024   Shock (HCC) 02/29/2024   Leukopenia 02/29/2024   CHF exacerbation (HCC) 02/28/2024   Lethargy 01/03/2024   Bacteremia 01/02/2024   Severe sepsis (HCC) 01/02/2024   Dysphagia 01/01/2024   Aspiration pneumonia of both lungs (HCC) 01/01/2024   Bilateral pleural effusion 01/01/2024   Lactic acidosis 01/01/2024   Permanent atrial fibrillation (HCC) 01/01/2024   Sepsis due to pneumonia (HCC) 12/31/2023   Pressure injury of skin 08/18/2023   Acute CHF (congestive heart failure) (HCC) 08/16/2023   Acute hypoxemic respiratory failure (HCC) 08/16/2023   CKD (chronic kidney disease) stage 3, GFR 30-59 ml/min (HCC) 08/16/2023  Atrial fibrillation with rapid ventricular response (HCC) 04/01/2023   Elevated LFTs 03/31/2023   Acute on chronic systolic CHF (congestive heart failure) (HCC) 03/29/2023   AKI (acute kidney injury) (HCC) 03/11/2023   Hyponatremia 03/11/2023   Arthritis of right shoulder region  12/09/2022   Bilateral shoulder pain 09/10/2022   S/P right hip fracture 08/20/2022   Hypotension 05/25/2022   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   Elevated troponin 01/19/2022   Hypokalemia 01/19/2022   LBBB (left bundle branch block) 01/19/2022   Dehydration 10/10/2019   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 [place] [address] Phone: [phone] Fax: [phone]  [id number] [place], [place] - [address] [facility] [address] [place] [address] Phone: [phone] Fax: [phone]  [facility] - [facility] [address] [place] [address] Phone: [phone] Fax: [phone]     Social Drivers of Health (SDOH) Social History: SDOH Screenings   Food Insecurity: No Food Insecurity (12/31/2023)  Housing: Low Risk  (12/31/2023)  Transportation Needs: No Transportation Needs (12/31/2023)  Utilities: Not At Risk (12/31/2023)  Alcohol Screen: Low Risk  (08/18/2023)  Financial Resource Strain: Low Risk  (08/18/2023)  Social Connections: Moderately Integrated (12/31/2023)  Tobacco Use: Medium Risk (02/28/2024)   SDOH Interventions:     Readmission Risk Interventions    03/01/2024    3:14 PM 01/01/2024    1:00 PM 04/02/2023    3:52 PM  Readmission Risk Prevention Plan  Transportation Screening Complete Complete Complete  PCP or Specialist Appt within 5-7 Days   Complete  PCP or Specialist Appt  within 3-5 Days  Complete   Home Care Screening   Complete  Medication Review (RN CM)   Complete  HRI or Home Care Consult  Complete   Social Work Consult for Recovery Care Planning/Counseling  Complete   Palliative Care Screening  Not Applicable   Medication Review Oceanographer)  Complete   PCP or Specialist appointment within 3-5 days of discharge Complete    HRI or Home Care Consult Complete    SW Recovery Care/Counseling Consult Complete    Palliative Care Screening Not Applicable    Skilled Nursing Facility Complete

## 2024-03-01 NOTE — Progress Notes (Signed)
 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     Patient Measurements: Height: 5' 2 (157.5 cm) Weight: 63.5 kg (139 lb 15.9 oz) IBW/kg (Calculated) : 50.1 HEPARIN  DW (KG): 61.7  Vital Signs: Temp: 97.7 F (36.5 C) (08/19 0803) BP: 116/62 (08/19 0800) Pulse Rate: 79 (08/19 0803)  Labs: Recent Labs    02/28/24 1825 02/28/24 2028 02/29/24 0419 02/29/24 0535 02/29/24 1233 03/01/24 0440 03/01/24 0730 03/01/24 0751  HGB 12.9  --  12.0  --   --  10.6*  --   --   HCT 43.3  --  39.7  --   --  33.1*  --   --   PLT 180  --  150  --   --  143*  --   --   HEPARINUNFRC  --   --   --   --  >1.10*  --  >1.10*  --   CREATININE 1.68*  --   --  1.65*  --   --   --  1.72*  TROPONINIHS 18* 15  --   --   --   --   --   --     Estimated Creatinine Clearance: 25.1 mL/min (A) (by C-G formula based on SCr of 1.72 mg/dL (H)).   Medical History: Past Medical History:  Diagnosis Date   Anxiety    Atrial fibrillation (HCC)    newly diagnosed 10/2010   Family history of adverse reaction to anesthesia    Sister's BP drops    Hip fracture (HCC) 01/18/2022   Hypertension    Rheumatoid arthritis(714.0)    Subclinical hyperthyroidism     Medications:  Medications Prior to Admission  Medication Sig Dispense Refill Last Dose/Taking   acetaminophen  (TYLENOL ) 500 MG tablet Take 500 mg by mouth every 4 (four) hours as needed for moderate pain (pain score 4-6).   Past Week   ALPRAZolam  (XANAX ) 1 MG tablet Take 1 mg by mouth 3 (three) times daily as needed for anxiety.   02/28/2024   amiodarone  (PACERONE ) 200 MG tablet Take 0.5 tablets (100 mg total) by mouth daily. 45 tablet 3 02/27/2024   diclofenac  Sodium (VOLTAREN ) 1 % GEL Apply 2 g topically 4 (four) times daily as needed  (pain).   Past Month   ELIQUIS  5 MG TABS tablet Take 1 tablet (5 mg total) by mouth 2 (two) times daily. 180 tablet 1 02/27/2024   furosemide  (LASIX ) 20 MG tablet Take 1 tablet (20 mg total) by mouth as needed. (Patient taking differently: Take 20 mg by mouth See admin instructions. Take 20mg  (1 tablet) by mouth for 3 days on, then 3 days off, then repeat.)   Past Week   loperamide  (IMODIUM  A-D) 2 MG tablet Take 2 mg by mouth 4 (four) times daily as needed for diarrhea or loose stools.   Unknown   losartan  (COZAAR ) 25 MG tablet Take 1 tablet (25 mg total) by mouth daily. 30 tablet 11 Past Week   methimazole  (TAPAZOLE ) 5 MG tablet Take 0.5 tablets (2.5 mg total) by mouth daily. 45 tablet 3 Past Week   metoprolol  succinate (TOPROL -XL) 100 MG 24 hr tablet Take 50 mg by mouth 2 (two) times daily. Take with or immediately following a meal.   02/27/2024   oxyCODONE -acetaminophen  (PERCOCET) 10-325 MG  tablet Take 1 tablet by mouth every 4 (four) hours as needed for pain. (Patient taking differently: Take 1 tablet by mouth 4 (four) times daily.) 30 tablet 0 02/28/2024   sodium chloride  1 g tablet Take 1 tablet (1 g total) by mouth as needed.   Unknown   Vitamin D , Ergocalciferol , (DRISDOL) 1.25 MG (50000 UNIT) CAPS capsule Take 50,000 Units by mouth once a week.   02/22/2024   ampicillin  (OMNIPEN) 2 g injection  (Patient not taking: Reported on 02/29/2024)   Not Taking   cefTRIAXone  (ROCEPHIN ) 10 g injection  (Patient not taking: Reported on 02/29/2024)   Not Taking    Assessment: Pharmacy is consulted to start IV heparin  on 73 yo female diagnosed with afib. Pt PTA med list includes apixaban  5 mg PO BID with last dose being on 8/16 in PM.   Pt just received a dose of enoxaparin  30 mg at 1105 on 8/18 prior to IV heparin  being ordered.   Today, 03/01/24 Hgb 10.6, decrease from previous of 12.0, plt 143  Scr 1.72 mg/dl, CrCl 25 ml/min   HL >8.89, indicating presence of apixaban  as expected due to poor renal  function aPTT is 189 sec, supratherapeutic   Goal of Therapy:  Heparin  level 0.3-0.7 units/ml aPTT 66-102 sec  Monitor platelets by anticoagulation protocol: Yes   Plan:  Hold heparin  drip for one hour then restart heparin   at 800 units/hr   Obtain aPTT  8 hours after restart of infusion  Daily CBC, HL while on heparin  Monitor for signs and symptoms of bleeding      Dolphus Roller, PharmD, BCPS 03/01/2024 9:54 AM

## 2024-03-01 NOTE — Plan of Care (Signed)
  Problem: Education: °Goal: Knowledge of General Education information will improve °Description: Including pain rating scale, medication(s)/side effects and non-pharmacologic comfort measures °Outcome: Progressing °  °Problem: Clinical Measurements: °Goal: Will remain free from infection °Outcome: Progressing °Goal: Respiratory complications will improve °Outcome: Progressing °Goal: Cardiovascular complication will be avoided °Outcome: Progressing °  °Problem: Coping: °Goal: Level of anxiety will decrease °Outcome: Progressing °  °

## 2024-03-01 NOTE — Progress Notes (Signed)
 Cardiology [place]  Subjective:   Very talkative this am. Primary complaint is heel pain  Objective:  Vitals:   03/01/24 0400 03/01/24 0433 03/01/24 0800 03/01/24 0803  BP: (!) 93/51  116/62   Pulse: 77  79 79  Resp: 20  16 19   Temp: 97.7 F (36.5 C)  97.7 F (36.5 C) 97.7 F (36.5 C)  TempSrc:      SpO2: 100%  100% 100%  Weight:  63.5 kg    Height:        Intake/Output from previous day:  Intake/Output Summary (Last 24 hours) at 03/01/2024 1011 Last data filed at 03/01/2024 0829 Gross per 24 hour  Intake 307.55 ml  Output 2575 ml  Net -2267.45 ml    Physical Exam:  Chronically ill female Bibasilar rales JVP elevated  No murmur Abdominal wall/thigh/LE edema Heel ulcers bilaterally   Lab Results: Basic Metabolic Panel: Recent Labs    02/28/24 1825 02/29/24 0535 03/01/24 0751  NA 133* 134* 136  K 4.3 3.8 3.6  CL 87* 90* 92*  CO2 30 32 33*  GLUCOSE 128* 96 78  BUN 30* 30* 31*  CREATININE 1.68* 1.65* 1.72*  CALCIUM 9.6 9.0 8.8*  MG 2.1  --   --    Liver Function Tests: Recent Labs    02/29/24 0535 03/01/24 0751  AST 22 25  ALT 12 12  ALKPHOS 105 113  BILITOT 1.1 1.0  PROT 5.4* 5.5*  ALBUMIN  1.6* 1.7*   No results for input(s): LIPASE, AMYLASE in the last 72 hours. CBC: Recent Labs    02/28/24 1825 02/29/24 0419 03/01/24 0440  WBC 3.8* 3.6* 3.6*  NEUTROABS 2.7  --   --   HGB 12.9 12.0 10.6*  HCT 43.3 39.7 33.1*  MCV 86.4 87.3 85.3  PLT 180 150 143*    Thyroid  Function Tests: Recent Labs    02/29/24 0419  TSH 11.667*    Imaging: DG CHEST PORT 1 VIEW Result Date: 02/29/2024 CLINICAL DATA:  PICC placement EXAM: PORTABLE CHEST 1 VIEW COMPARISON:  Chest x-ray 02/28/2024 FINDINGS: Right upper extremity PICC terminates in the mid SVC. There is no pneumothorax. Small right pleural effusion is unchanged. Small left pleural effusion has slightly decreased. Patchy bibasilar infiltrates persist. There is central pulmonary vascular  congestion. The heart is mildly enlarged. The osseous structures are stable. IMPRESSION: 1. Right upper extremity PICC terminates in the mid SVC. No pneumothorax. 2. Small bilateral pleural effusions, slightly decreased on the left. 3. Patchy bibasilar infiltrates persist. Electronically Signed   By: Greig Pique M.D.   On: 02/29/2024 15:36   US  EKG SITE RITE Result Date: 02/29/2024 If Site Rite image not attached, placement could not be confirmed due to current cardiac rhythm.  ECHOCARDIOGRAM LIMITED Result Date: 02/29/2024    ECHOCARDIOGRAM LIMITED REPORT   Patient Name:   Rhonda Marshall Date of Exam: 02/29/2024 Medical Rec #:  [id number]        Height:       62.0 in Accession #:    [id number]       Weight:       136.0 lb Date of Birth:  Jun 23, 1951        BSA:          1.623 m Patient Age:    73 years         BP:           73/34 mmHg Patient Gender: F  HR:           83 bpm. Exam Location:  Inpatient Procedure: 2D Echo, Limited Echo, Cardiac Doppler, Color Doppler and Strain            Analysis (Both Spectral and Color Flow Doppler were utilized during            procedure). Indications:     CHF  History:         Patient has prior history of Echocardiogram examinations, most                  recent 01/01/2024. CHF, Arrythmias:Atrial Fibrillation,                  Signs/Symptoms:Shortness of Breath and Edema; Risk                  Factors:Hypertension.  Sonographer:     Therisa Crouch Referring Phys:  [id number] JESSICA MARSHALL Diagnosing Phys: Ezra McleanMD IMPRESSIONS  1. Left ventricular ejection fraction, by estimation, is 30 to 35%. Left ventricular ejection fraction by 2D MOD biplane is 33.5 %. The left ventricle has moderately decreased function. The left ventricle demonstrates global hypokinesis with septal-lateral dyssynchrony suggestive of LBBB. There is mild concentric left ventricular hypertrophy. Left ventricular diastolic parameters are indeterminate.  2. Right ventricular systolic  function is mildly reduced. The right ventricular size is normal. There is normal pulmonary artery systolic pressure. The estimated right ventricular systolic pressure is 25.0 mmHg.  3. Left atrial size was severely dilated.  4. Right atrial size was mildly dilated.  5. There is mild calcification of the aortic valve. Aortic valve regurgitation is not visualized. No aortic stenosis is present. 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  6. The mitral valve is normal in structure. Trivial mitral valve regurgitation. No evidence of mitral stenosis.  7. The inferior vena cava is normal in size with <50% respiratory variability, suggesting right atrial pressure of 8 mmHg.  8. Left pleural effusion noted. A small pericardial effusion is present.  9. The patient was in atrial fibrillation. FINDINGS  Left Ventricle: Left ventricular ejection fraction, by estimation, is 30 to 35%. Left ventricular ejection fraction by 2D MOD biplane is 33.5 %. The left ventricle has moderately decreased function. The left ventricle demonstrates global hypokinesis. The average left ventricular global longitudinal strain is -8.6 %. Strain was performed and the global longitudinal strain is abnormal. The left ventricular internal cavity size was normal in size. There is mild concentric left ventricular hypertrophy. Left ventricular diastolic parameters are indeterminate. Right Ventricle: The right ventricular size is normal. Right ventricular systolic function is mildly reduced. There is normal pulmonary artery systolic pressure. The tricuspid regurgitant velocity is 2.06 m/s, and with an assumed right atrial pressure of  8 mmHg, the estimated right ventricular systolic pressure is 25.0 mmHg. Left Atrium: Left atrial size was severely dilated. Right Atrium: Right atrial size was mildly dilated. Pericardium:  Left pleural effusion noted. A small pericardial effusion is present. Mitral Valve: The mitral valve is normal in structure. Trivial mitral valve regurgitation. No evidence of mitral valve stenosis. Tricuspid Valve: The tricuspid valve is normal in structure. Tricuspid valve regurgitation is mild. Aortic Valve: There is mild calcification of the aortic valve. Aortic valve regurgitation is not visualized. No aortic stenosis is present. Venous: The inferior vena cava is normal in size with less  than 50% respiratory variability, suggesting right atrial pressure of 8 mmHg. IAS/Shunts: No atrial level shunt detected by color flow Doppler. LEFT VENTRICLE PLAX 2D                        Biplane EF (MOD) LVIDd:         3.90 cm         LV Biplane EF:   Left LVIDs:         3.40 cm                          ventricular LV PW:         1.10 cm                          ejection LV IVS:        1.20 cm                          fraction by                                                 2D MOD                                                 biplane is LV Volumes (MOD)                                33.5 %. LV vol d, MOD    80.7 ml A2C: LV vol d, MOD    76.4 ml       2D Longitudinal A4C:                           Strain LV vol s, MOD    50.8 ml       2D Strain GLS   -8.6 % A2C:                           Avg: LV vol s, MOD    53.6 ml A4C: LV SV MOD A2C:   29.9 ml LV SV MOD A4C:   76.4 ml LV SV MOD BP:    27.0 ml RIGHT VENTRICLE          IVC RV Basal diam:  3.80 cm  IVC diam: 1.90 cm RV Mid diam:    2.80 cm LEFT ATRIUM              Index        RIGHT ATRIUM           Index LA diam:        3.80 cm  2.34 cm/m   RA Area:     18.30 cm LA Vol (A2C):   109.0 ml 67.17 ml/m  RA Volume:   47.40 ml  29.21 ml/m LA Vol (A4C):   103.0 ml 63.47 ml/m LA Biplane Vol: 106.0 ml 65.32 ml/m  TRICUSPID VALVE TR Peak grad:   17.0  mmHg TR Vmax:        206.00 cm/s Dalton McleanMD Electronically signed by Ezra Kanner Signature Date/Time:  02/29/2024/8:17:39 AM    Final (Updated)    CT HEAD WO CONTRAST ( ) Result Date: 02/29/2024 CLINICAL DATA:  Delirium EXAM: CT HEAD WITHOUT CONTRAST TECHNIQUE: Contiguous axial images were obtained from the base of the skull through the vertex without intravenous contrast. RADIATION DOSE REDUCTION: This exam was performed according to the departmental dose-optimization program which includes automated exposure control, adjustment of the mA and/or kV according to patient size and/or use of iterative reconstruction technique. COMPARISON:  CT head 01/03/2024 FINDINGS: Brain: No intracranial hemorrhage, mass effect, or evidence of acute infarct. No hydrocephalus. No extra-axial fluid collection. Age-commensurate cerebral atrophy and chronic small vessel ischemic disease. Vascular: No hyperdense vessel. Intracranial arterial calcification. Skull: No fracture or focal lesion. Sinuses/Orbits: No acute finding. Other: None. IMPRESSION: No acute intracranial abnormality. Electronically Signed   By: Norman Gatlin M.D.   On: 02/29/2024 03:04   DG Elbow 2 Views Right Result Date: 02/28/2024 CLINICAL DATA:  Pain. EXAM: RIGHT ELBOW - 2 VIEW COMPARISON:  None Available. FINDINGS: There is no evidence of fracture, dislocation, or definite joint effusion. There is no evidence of arthropathy or other focal bone abnormality. No erosive change. Prominent generalized soft tissue edema. IMPRESSION: Prominent generalized soft tissue edema. No acute osseous abnormality. Electronically Signed   By: Andrea Gasman M.D.   On: 02/28/2024 18:44   DG Chest Port 1 View Result Date: 02/28/2024 CLINICAL DATA:  Shortness of breath. EXAM: PORTABLE CHEST 1 VIEW COMPARISON:  Most recent radiograph 01/07/2024 FINDINGS: Increased bilateral pleural effusions, partially loculated on the right. Associated bibasilar opacities. The heart is enlarged but stable. Slight vascular congestion. No pneumothorax. IMPRESSION: 1. Increased bilateral  pleural effusions, partially loculated on the right. Associated bibasilar opacities. 2. Cardiomegaly with slight vascular congestion. Electronically Signed   By: Andrea Gasman M.D.   On: 02/28/2024 18:44    Cardiac Studies:  ECG: afib rate 88 LBBB   Telemetry: afib  Echo: EF 30-35% ? Lambles' lesion on AV trivial MR  Medications:    Chlorhexidine  Gluconate Cloth  6 each Topical Q0600   mouth rinse  15 mL Mouth Rinse 4 times per day   pantoprazole  (PROTONIX ) IV  40 mg Intravenous Q24H      sodium chloride  Stopped (02/29/24 1820)   sodium chloride      heparin      linezolid  (ZYVOX ) IV     norepinephrine  (LEVOPHED ) Adult infusion Stopped (03/01/24 0726)   piperacillin -tazobactam (ZOSYN )  IV 12.5 mL/hr at 03/01/24 9170    Assessment/Plan:  AFib:  rates are fine beta blocker held due to low BP was on eliquis  prior to admission. Would cover with heparin  /lovenox  CHF:  non ischemic with LBBB EF 30-35% GDMT limited by low BP in setting of ? Recurrent sepsis. She is volume overloaded with effusions, JVP elevation, abdominal wall/LE edema BNP 3431. She is negative about 2 L's. CVP 10-12 and coox 72% on levophed . Lasix  as needed to keep I/O's negative about 250-500 cc/day Sepsis:  ? Recurrent aspiration with esophageal dysmotility. Procalcitionin negative Lactic acid 2.8 improving. On Zosyn . BC;s negative Has arterial line placed  in right radial Reviewe of echo doubt vegetation on AV Thyroid :  TSH 11 free T4 normal No amiodarone  was on Tapazole  prior to admission   With further diuresis convert levo to midodrine per CCM Suspect a lot of her edema is 3 rd spacing from Albumin   of 1.7. Wound care for heel ulcers    ? Need for goals of care with patient and family.  DNR intervention  Rhonda Marshall 03/01/2024, 10:11 AM

## 2024-03-01 NOTE — Progress Notes (Signed)
 RT verified from NP/RN that a line was ok to be placed in right arm after unsuccessful attempt on the left arm.

## 2024-03-01 NOTE — Consult Note (Signed)
 NAMEKeyetta Marshall, MRN:  [id number], DOB:  [birth year], LOS: 2 ADMISSION DATE:  02/28/2024, CONSULTATION DATE:  02/29/24 REFERRING MD:  TRH, CHIEF COMPLAINT:  shortness of breath  History of Present Illness:  73 yo female presented with sob and b/l le edema, confusion, swelling of the right elbow since after a fall approximately 1 month ago. Pt was additionally recently admitted the end of June for similar symptoms with heart failure, pleural effusions/ pneumonia and subsequent hypotension requiring low dose vasopressors for adequate diuresis.   Today she was noted to be hypothermic to 95.6, tachypneic and hypoxic with sats in 70's. She was placed on NIV at that time to support her respiratory status. She was noted to have CM with vascular congestion and pleural effusions on cxr. Pt was also increasingly altered and somnolent and with her chronic use of a/c was sent for cth (which proved to be without acute abnormality). TRH had been asked to admit pt. She was given 20 mg lasix  for diuresis and admitted to progressive. However, which her persistent encephalopathy and concern for continued decline CCM was asked to eval. Pt was unable to provide a full review of symptoms and all information was obtained from chart review.   Ccm has been asked to eval with pt's worsening mental status, increasing somnolence and need for NIV.   Upon my eval pt was drowsy, on NIV. BP stable NOT on vasopressors. She was arousable and oriented to self and place.   Pertinent  Medical History  Anxiety Afib, on chronic eliquis  Combined systolic/diastolic heart failure Arthritis [id number]  Significant Hospital Events: Including procedures, antibiotic start and stop dates in addition to other pertinent events   8/18: Admitted to progressive care with TRH, transferred to stepdown for worsening mentation 8/19: Aline pulled out and replaced overnight, levophed  resumed and weaned off  Interim History / Subjective:  Feels  much better this morning, patient tells me that she is now hungry for the first time in several weeks. She denies shortness of breath or chest pain.   Objective    Blood pressure 116/62, pulse 79, temperature 97.7 F (36.5 C), resp. rate 19, height 5' 2 (1.575 m), weight 63.5 kg, SpO2 100%. CVP:  [5 mmHg-11 mmHg] 6 mmHg  FiO2 (%):  [50 %] 50 %   Intake/Output Summary (Last 24 hours) at 03/01/2024 0936 Last data filed at 03/01/2024 9170 Gross per 24 hour  Intake 323.72 ml  Output 2575 ml  Net -2251.28 ml   Filed Weights   02/28/24 1750 02/29/24 0315 03/01/24 0433  Weight: 53.1 kg 61.7 kg 63.5 kg    Examination: General: chronically ill appearing, elderly female, no acute distress HENT: moist mucus membranes Lungs: breathing comfortably on nasal cannula, breath sounds diminished bilateral bases Cardiovascular: irregular rhythm, rate 70-80's Abdomen: soft, non-tender, and non-distended  Extremities: +Anasarca. Spontaneously moves all 4 extremities, numerous skin lesions on feet and neck with some erythema Neuro: arousable, alert and oriented x 3 (person, place, time), moves all extremities spontaneously  GU: deferred  Remains leukopenic with WBC 3.6 Cr increased to 1.72 from 1.65  Bicarb increased 33 from 32  Na improved 136  Resolved problem list   Assessment and Plan   Undifferentiated shock, improved Lactic acidosis, resolved Leukopenia Met criteria for severe sepsis on admission given leukopenia, hypothermia, lactic acidosis and hypotension. Possible sources of infection include pneumonia vs. bacteremia with recent PICC line outpatient for IV antibiotics. She recently completed empiric treatment for endocarditis with IV ampicillin   and ceftriaxone  on 02/13/2024. However, pro-cal was <0.10 and infectious work-up negative thus far. Less likely cardiogenic shock given normal CVP and ScvO2 72.5. Echo with new mild RV dysfunction and small mobile density on aortic valve, small  pericardial effusion and EF 30-35%.  - Consider TEE to further evaluate ?aortic valve mass  - ID consulted given aortic valve mass and recent treatment for possible endocarditis, would continue empiric coverage with linezolid  and zosyn  pending recommendations. Follow-up cultures - Levophed  as needed to maintain MAP>65, can consider midodrine if infectious work-up remains unremarkable  - Continue to trend CVP  Acute on chronic combined HF (systolic/diastolic) BNP on admission 3431.8. Echo per above. - Hold diuresis given increase in Cr and bicarb with diuresis yesterday and low CVP 6-7 - Has not tolerated GDMT in the past due to hypotension  Chronic Afib (on Eliquis ) Remains rate controlled Afib.  - Holding home amiodarone  and metoprolol  for now - Continue AC with heparin  drip, holding home Eliquis   Acute hypoxic/hypercarbic respiratory failure, likely multifactorial due to volume overload and possible pneumonia, improving Intermittently on BiPap yesterday now on 2L [place] with good oxygenation. Suspect pulmonary edema and pleural effusions in the setting of acute on chronic combined HF and hypoalbuminemia.  - BiPap PRN - Wean oxygen  as able to maintain SpO2>92%  Acute metabolic encephalopathy, improved CTH NAICP.  - Continue to monitor neuro exam  AKI on CKD Chronic hyponatremia Baseline Cr~1.0-1.2. AKI likely in the setting of hypotension. Cr worsening today with diuresis yesterday and remains with low filling pressures and increased bicarb.  - Hold diuresis today per above  Elevated TSH History of hyperthyroidism TSH 11.6 on 8/18, T4 normal. Likely subclinical hypothyroidism in the setting of acute illness. - Holding home methimazole   - Recheck TFTs in 2-4 weeks  Malnutrition Hypoalbuminemia - Continue dysphagia diet  - RD consulted  Deconditioning  - Order PT/OT  Best Practice (right click and Reselect all SmartList Selections daily)   Diet/type: Regular consistency  (see orders) DVT prophylaxis systemic heparin  Pressure ulcer(s): present on admission  GI prophylaxis: PPI Lines: Arterial Line Foley:  Yes, and it is still needed Code Status:  full code Last date of multidisciplinary goals of care discussion [8/18] - see separate IPAL note  Labs   CBC: Recent Labs  Lab 02/28/24 1825 02/29/24 0419 03/01/24 0440  WBC 3.8* 3.6* 3.6*  NEUTROABS 2.7  --   --   HGB 12.9 12.0 10.6*  HCT 43.3 39.7 33.1*  MCV 86.4 87.3 85.3  PLT 180 150 143*    Basic Metabolic Panel: Recent Labs  Lab 02/28/24 1825 02/29/24 0535 03/01/24 0751  NA 133* 134* 136  K 4.3 3.8 3.6  CL 87* 90* 92*  CO2 30 32 33*  GLUCOSE 128* 96 78  BUN 30* 30* 31*  CREATININE 1.68* 1.65* 1.72*  CALCIUM 9.6 9.0 8.8*  MG 2.1  --   --    GFR: Estimated Creatinine Clearance: 25.1 mL/min (A) (by C-G formula based on SCr of 1.72 mg/dL (H)). Recent Labs  Lab 02/28/24 1825 02/28/24 1832 02/28/24 2041 02/29/24 0419 02/29/24 0535 03/01/24 0440  PROCALCITON  --   --   --   --  <0.10  --   WBC 3.8*  --   --  3.6*  --  3.6*  LATICACIDVEN  --  2.8* 2.5*  --  1.3  --     Liver Function Tests: Recent Labs  Lab 02/28/24 1825 02/29/24 0535 03/01/24 0751  AST 31  22 25  ALT 14 12 12   ALKPHOS 135* 105 113  BILITOT 1.4* 1.1 1.0  PROT 7.0 5.4* 5.5*  ALBUMIN  1.9* 1.6* 1.7*   No results for input(s): LIPASE, AMYLASE in the last 168 hours. No results for input(s): AMMONIA in the last 168 hours.  ABG    Component Value Date/Time   PHART 7.48 (H) 01/03/2024 1603   PCO2ART 42 01/03/2024 1603   PO2ART 111 (H) 01/03/2024 1603   HCO3 38.5 (H) 02/29/2024 0107   ACIDBASEDEF 2.4 (H) 12/31/2023 0430   O2SAT 72.5 02/29/2024 1608     Coagulation Profile: No results for input(s): INR, PROTIME in the last 168 hours.  Cardiac Enzymes: No results for input(s): CKTOTAL, CKMB, CKMBINDEX, TROPONINI in the last 168 hours.  HbA1C: Hgb A1c MFr Bld  Date/Time Value Ref  Range Status  08/16/2023 05:53 AM 6.1 (H) 4.8 - 5.6 % Final    Comment:    (NOTE) Pre diabetes:          5.7%-6.4%  Diabetes:              >6.4%  Glycemic control for   <7.0% adults with diabetes   03/12/2023 03:25 AM 6.1 (H) 4.8 - 5.6 % Final    Comment:    (NOTE) Pre diabetes:          5.7%-6.4%  Diabetes:              >6.4%  Glycemic control for   <7.0% adults with diabetes     CBG: No results for input(s): GLUCAP in the last 168 hours.  Critical care time: 38 minutes    The patient is critically ill with multiple organ system failure and requires high complexity decision making for assessment and support, frequent evaluation and titration of therapies, advanced monitoring, review of radiographic studies and interpretation of complex data.   Critical Care Time devoted to patient care services, exclusive of separately billable procedures, described in this note is 38 minutes.  Rexene LOISE Blush, PA-C [facility] 03/01/24 9:36 AM  Please see [email or web address] for pager details.  From 7A-7P if no response, please call [phone] After hours, please call ELink [phone]

## 2024-03-01 NOTE — Progress Notes (Signed)
 Art line BP is reading 78/47 Elink notified. Patient is in bed alert and remains on BiPAP

## 2024-03-02 ENCOUNTER — Inpatient Hospital Stay (HOSPITAL_COMMUNITY)

## 2024-03-02 DIAGNOSIS — I5022 Chronic systolic (congestive) heart failure: Secondary | ICD-10-CM | POA: Diagnosis not present

## 2024-03-02 DIAGNOSIS — J9601 Acute respiratory failure with hypoxia: Secondary | ICD-10-CM | POA: Diagnosis not present

## 2024-03-02 DIAGNOSIS — I5043 Acute on chronic combined systolic (congestive) and diastolic (congestive) heart failure: Secondary | ICD-10-CM | POA: Diagnosis not present

## 2024-03-02 DIAGNOSIS — J690 Pneumonitis due to inhalation of food and vomit: Secondary | ICD-10-CM

## 2024-03-02 DIAGNOSIS — I482 Chronic atrial fibrillation, unspecified: Secondary | ICD-10-CM | POA: Diagnosis not present

## 2024-03-02 LAB — PHOSPHORUS: Phosphorus: 3.5 mg/dL (ref 2.5–4.6)

## 2024-03-02 LAB — CBC
HCT: 33.3 % — ABNORMAL LOW (ref 36.0–46.0)
Hemoglobin: 10.2 g/dL — ABNORMAL LOW (ref 12.0–15.0)
MCH: 26.6 pg (ref 26.0–34.0)
MCHC: 30.6 g/dL (ref 30.0–36.0)
MCV: 86.7 fL (ref 80.0–100.0)
Platelets: 147 K/uL — ABNORMAL LOW (ref 150–400)
RBC: 3.84 MIL/uL — ABNORMAL LOW (ref 3.87–5.11)
RDW: 22.6 % — ABNORMAL HIGH (ref 11.5–15.5)
WBC: 4.9 K/uL (ref 4.0–10.5)
nRBC: 0 % (ref 0.0–0.2)

## 2024-03-02 LAB — LEGIONELLA PNEUMOPHILA SEROGP 1 UR AG: L. pneumophila Serogp 1 Ur Ag: NEGATIVE

## 2024-03-02 LAB — COMPREHENSIVE METABOLIC PANEL WITH GFR
ALT: 12 U/L (ref 0–44)
AST: 27 U/L (ref 15–41)
Albumin: 1.7 g/dL — ABNORMAL LOW (ref 3.5–5.0)
Alkaline Phosphatase: 103 U/L (ref 38–126)
Anion gap: 12 (ref 5–15)
BUN: 28 mg/dL — ABNORMAL HIGH (ref 8–23)
CO2: 33 mmol/L — ABNORMAL HIGH (ref 22–32)
Calcium: 8.6 mg/dL — ABNORMAL LOW (ref 8.9–10.3)
Chloride: 86 mmol/L — ABNORMAL LOW (ref 98–111)
Creatinine, Ser: 1.55 mg/dL — ABNORMAL HIGH (ref 0.44–1.00)
GFR, Estimated: 35 mL/min — ABNORMAL LOW (ref >60)
Glucose, Bld: 98 mg/dL (ref 70–99)
Potassium: 3.6 mmol/L (ref 3.5–5.1)
Sodium: 131 mmol/L — ABNORMAL LOW (ref 135–145)
Total Bilirubin: 1 mg/dL (ref 0.0–1.2)
Total Protein: 5.6 g/dL — ABNORMAL LOW (ref 6.5–8.1)

## 2024-03-02 LAB — HEPARIN LEVEL (UNFRACTIONATED): Heparin Unfractionated: >1.1 [IU]/mL — ABNORMAL HIGH (ref 0.30–0.70)

## 2024-03-02 LAB — MAGNESIUM: Magnesium: 1.6 mg/dL — ABNORMAL LOW (ref 1.7–2.4)

## 2024-03-02 LAB — APTT: aPTT: 95 s — ABNORMAL HIGH (ref 24–36)

## 2024-03-02 MED ORDER — APIXABAN 5 MG PO TABS
5.0000 mg | ORAL_TABLET | Freq: Two times a day (BID) | ORAL | Status: DC
  Administered 2024-03-02 – 2024-03-08 (×13): 5 mg via ORAL
  Filled 2024-03-02 (×13): qty 1

## 2024-03-02 MED ORDER — FUROSEMIDE 10 MG/ML IJ SOLN
40.0000 mg | Freq: Every day | INTRAMUSCULAR | Status: DC
  Administered 2024-03-02: 40 mg via INTRAVENOUS
  Filled 2024-03-02: qty 4

## 2024-03-02 MED ORDER — ALPRAZOLAM 0.5 MG PO TABS
0.5000 mg | ORAL_TABLET | Freq: Two times a day (BID) | ORAL | Status: DC | PRN
  Administered 2024-03-02 – 2024-03-06 (×7): 0.5 mg via ORAL
  Filled 2024-03-02 (×7): qty 1

## 2024-03-02 MED ORDER — FUROSEMIDE 10 MG/ML IJ SOLN
40.0000 mg | Freq: Two times a day (BID) | INTRAMUSCULAR | Status: DC
  Administered 2024-03-02 – 2024-03-04 (×5): 40 mg via INTRAVENOUS
  Filled 2024-03-02 (×5): qty 4

## 2024-03-02 MED ORDER — FUROSEMIDE 10 MG/ML IJ SOLN: 40.0000 mg | Freq: Two times a day (BID) | INTRAMUSCULAR | Status: DC

## 2024-03-02 MED ORDER — POTASSIUM CHLORIDE 20 MEQ PO PACK
40.0000 meq | PACK | Freq: Once | ORAL | Status: AC
  Administered 2024-03-02: 40 meq via ORAL
  Filled 2024-03-02: qty 2

## 2024-03-02 MED ORDER — AMIODARONE HCL 200 MG PO TABS
100.0000 mg | ORAL_TABLET | Freq: Every day | ORAL | Status: DC
  Administered 2024-03-02 – 2024-03-08 (×7): 100 mg via ORAL
  Filled 2024-03-02 (×7): qty 1

## 2024-03-02 NOTE — Progress Notes (Addendum)
 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     Patient Measurements: Height: 5' 2 (157.5 cm) Weight: 63 kg (138 lb 14.2 oz) IBW/kg (Calculated) : 50.1 HEPARIN  DW (KG): 61.7  Vital Signs: Temp: 98 F (36.7 C) (08/20 0800) Temp Source: Oral (08/20 0800) BP: 121/67 (08/20 0700) Pulse Rate: 97 (08/20 0800)  Labs: Recent Labs    02/28/24 1825 02/28/24 2028 02/29/24 0419 02/29/24 0535 02/29/24 1233 03/01/24 0440 03/01/24 0730 03/01/24 0751 03/01/24 2059 03/02/24 0502  HGB 12.9  --  12.0  --   --  10.6*  --   --   --  10.2*  HCT 43.3  --  39.7  --   --  33.1*  --   --   --  33.3*  PLT 180  --  150  --   --  143*  --   --   --  147*  APTT  --   --   --   --   --   --  189*  --  88* 95*  HEPARINUNFRC  --   --   --   --  >1.10*  --  >1.10*  --   --  >1.10*  CREATININE 1.68*  --   --  1.65*  --   --   --  1.72*  --  1.55*  TROPONINIHS 18* 15  --   --   --   --   --   --   --   --     Estimated Creatinine Clearance: 27.8 mL/min (A) (by C-G formula based on SCr of 1.55 mg/dL (H)).   Medical History: Past Medical History:  Diagnosis Date   Anxiety    Atrial fibrillation (HCC)    newly diagnosed 10/2010   Family history of adverse reaction to anesthesia    Sister's BP drops    Hip fracture (HCC) 01/18/2022   Hypertension    Rheumatoid arthritis(714.0)    Subclinical hyperthyroidism     Medications:  Medications Prior to Admission  Medication Sig Dispense Refill Last Dose/Taking   acetaminophen  (TYLENOL ) 500 MG tablet Take 500 mg by mouth every 4 (four) hours as needed for moderate pain (pain score 4-6).   Past Week   ALPRAZolam  (XANAX ) 1 MG tablet Take 1 mg by mouth 3 (three) times daily as needed for anxiety.   02/28/2024   amiodarone  (PACERONE )  200 MG tablet Take 0.5 tablets (100 mg total) by mouth daily. 45 tablet 3 02/27/2024   diclofenac  Sodium (VOLTAREN ) 1 % GEL Apply 2 g topically 4 (four) times daily as needed (pain).   Past Month   ELIQUIS  5 MG TABS tablet Take 1 tablet (5 mg total) by mouth 2 (two) times daily. 180 tablet 1 02/27/2024   furosemide  (LASIX ) 20 MG tablet Take 1 tablet (20 mg total) by mouth as needed. (Patient taking differently: Take 20 mg by mouth See admin instructions. Take 20mg  (1 tablet) by mouth for 3 days on, then 3 days off, then repeat.)   Past Week   loperamide  (IMODIUM  A-D) 2 MG tablet Take 2 mg by mouth 4 (four) times daily as needed for diarrhea or loose stools.   Unknown   losartan  (COZAAR ) 25 MG tablet Take 1 tablet (25 mg total) by mouth daily.  30 tablet 11 Past Week   methimazole  (TAPAZOLE ) 5 MG tablet Take 0.5 tablets (2.5 mg total) by mouth daily. 45 tablet 3 Past Week   metoprolol  succinate (TOPROL -XL) 100 MG 24 hr tablet Take 50 mg by mouth 2 (two) times daily. Take with or immediately following a meal.   02/27/2024   oxyCODONE -acetaminophen  (PERCOCET) 10-325 MG tablet Take 1 tablet by mouth every 4 (four) hours as needed for pain. (Patient taking differently: Take 1 tablet by mouth 4 (four) times daily.) 30 tablet 0 02/28/2024   sodium chloride  1 g tablet Take 1 tablet (1 g total) by mouth as needed.   Unknown   Vitamin D , Ergocalciferol , (DRISDOL) 1.25 MG (50000 UNIT) CAPS capsule Take 50,000 Units by mouth once a week.   02/22/2024   ampicillin  (OMNIPEN) 2 g injection  (Patient not taking: Reported on 02/29/2024)   Not Taking   cefTRIAXone  (ROCEPHIN ) 10 g injection  (Patient not taking: Reported on 02/29/2024)   Not Taking    Assessment: Pharmacy is consulted to start IV heparin  on 73 yo female diagnosed with afib. Pt PTA med list includes apixaban  5 mg PO BID with last dose being on 8/16 in PM.  - Enoxaparin  30 mg given at 1105 on 8/18 prior to IV heparin  being ordered.   Today, 03/02/24 aPTT 95  sec, therapeutic on heparin  800 units/hr HL remains elevated at >1.10 due to slow apixaban  clearance  Hgb 10.2, plt 147 Scr 1.55 mg/dl, CrCl 28 ml/min, slight improvement  No bleeding or complications reported  Goal of Therapy:  Heparin  level 0.3-0.7 units/ml aPTT 66-102 sec  Monitor platelets by anticoagulation protocol: Yes   Plan:  Continue heparin  IV infusion at 800 units/hr Daily aPTT, heparin  level, and CBC Monitor for signs and symptoms of bleeding   Rhonda Marshall, PharmD, BCPS 03/02/2024 9:18 AM   Addendum - plan to resume apixaban  today - Stop heparin  drip and start apixaban  5 mg PO BID    Rhonda Marshall, PharmD, BCPS 03/02/2024 2:06 PM

## 2024-03-02 NOTE — Evaluation (Signed)
 Physical Therapy Evaluation Patient Details Name: Linzee Depaul MRN: [id number] DOB: 26-Feb-1951 Today's Date: 03/02/2024  History of Present Illness  73 yo female admitted 02/28/24 with SOB, hypoxia, bilateral lower extremity edema, and confusion, and right elbow swelling, acute encephalopaty, septic shock.SABRA  BMP was elevated at 3431, EF 30-35%. PMH: AFIB, RA, anxiety, hop fracture, right elb fracture.  Clinical Impression  Pt admitted with above diagnosis.  Pt currently with functional limitations due to the deficits listed below (see PT Problem List). Pt will benefit from acute skilled PT to increase their independence and safety with mobility to allow discharge.     The patient is well known to therapy. Difficulty get ting clear picture of PLOF. [Patient indicates that she does not mobilize without family support. No family present. Patient requiring 2 person assistance for mobility to recliner.  Patient will benefit from continued inpatient follow up therapy, <3 hours/day. Patient on 2L [place], Spo2 100%.       If plan is discharge home, recommend the following: Two people to help with walking and/or transfers;A lot of help with bathing/dressing/bathroom;Assistance with cooking/housework;Direct supervision/assist for medications management;Help with stairs or ramp for entrance;Assist for transportation   Can travel by private vehicle        Equipment Recommendations None recommended by PT  Recommendations for Other Services       Functional Status Assessment Patient has had a recent decline in their functional status and demonstrates the ability to make significant improvements in function in a reasonable and predictable amount of time.     Precautions / Restrictions Precautions Precautions: Fall Precaution/Restrictions Comments: monitor sats/HR Restrictions Weight Bearing Restrictions Per Provider Order: No      Mobility  Bed Mobility Overal bed mobility: Needs  Assistance Bed Mobility: Supine to Sit     Supine to sit: Mod assist     General bed mobility comments: assist trunk and to scoot to bed edge    Transfers Overall transfer level: Needs assistance Equipment used: 2 person hand held assist Transfers: Sit to/from Stand, Bed to chair/wheelchair/BSC Sit to Stand: Mod assist, +2 physical assistance, +2 safety/equipment   Step pivot transfers: Mod assist, +2 physical assistance, +2 safety/equipment       General transfer comment: extra time, support for balance to stap to recliner    Ambulation/Gait                  Stairs            Wheelchair Mobility     Tilt Bed    Modified Rankin (Stroke Patients Only)       Balance Overall balance assessment: Needs assistance, History of Falls Sitting-balance support: No upper extremity supported, Feet supported Sitting balance-Leahy Scale: Fair     Standing balance support: During functional activity, Bilateral upper extremity supported, Reliant on assistive device for balance Standing balance-Leahy Scale: Poor                               Pertinent Vitals/Pain      Home Living Family/patient expects to be discharged to:: Private residence Living Arrangements: Children Available Help at Discharge: Family Type of Home: House Home Access: Stairs to enter Entrance Stairs-Rails: Can reach both Entrance Stairs-Number of Steps: 3 Alternate Level Stairs-Number of Steps: her bedroom is on the main level Home Layout: Two level;Able to live on main level with bedroom/bathroom Home Equipment: Shower seat;Rolling Walker (2 wheels);BSC/3in1;Wheelchair - manual  Additional Comments: son and dtr in law assistassist    Prior Function               Mobility Comments: pt, reports limited mobility, afew steps with d-in-law assisting to BR, ADLs Comments: assisted by family     Extremity/Trunk Assessment        Lower Extremity Assessment Lower  Extremity Assessment: Generalized weakness (reports heels are sore)    Cervical / Trunk Assessment Cervical / Trunk Assessment: Kyphotic  Communication   Communication Communication: Impaired Factors Affecting Communication: Hearing impaired    Cognition Arousal: Alert Behavior During Therapy: WFL for tasks assessed/performed   PT - Cognitive impairments: Difficult to assess, No family/caregiver present to determine baseline, Attention                       PT - Cognition Comments: pt talks constantly from one topic to another, frequent redirections to follow  mobility. Following commands: Impaired Following commands impaired: Follows one step commands inconsistently     Cueing Cueing Techniques: Verbal cues, Gestural cues, Tactile cues     General Comments      Exercises     Assessment/Plan    PT Assessment Patient needs continued PT services  PT Problem List Decreased strength;Decreased knowledge of use of DME;Pain;Decreased activity tolerance;Decreased balance;Decreased mobility;Decreased cognition       PT Treatment Interventions DME instruction;Patient/family education;Gait training;Functional mobility training;Therapeutic activities    PT Goals (Current goals can be found in the Care Plan section)  Acute Rehab PT Goals Patient Stated Goal: none stated PT Goal Formulation: With patient Time For Goal Achievement: 03/16/24 Potential to Achieve Goals: Good    Frequency Min 2X/week     Co-evaluation PT/OT/SLP Co-Evaluation/Treatment: Yes Reason for Co-Treatment: Complexity of the patient's impairments (multi-system involvement);For patient/therapist safety PT goals addressed during session: Mobility/safety with mobility OT goals addressed during session: ADL's and self-care       AM-PAC PT 6 Clicks Mobility  Outcome Measure Help needed turning from your back to your side while in a flat bed without using bedrails?: A Lot Help needed moving from  lying on your back to sitting on the side of a flat bed without using bedrails?: A Lot Help needed moving to and from a bed to a chair (including a wheelchair)?: A Lot Help needed standing up from a chair using your arms (e.g., wheelchair or bedside chair)?: Total Help needed to walk in hospital room?: Total Help needed climbing 3-5 steps with a railing? : Total 6 Click Score: 9    End of Session   Activity Tolerance: Patient limited by fatigue Patient left: in chair;with call bell/phone within reach;with chair alarm set Nurse Communication: Mobility status PT Visit Diagnosis: Unsteadiness on feet (R26.81)    Time: 8843-8780 PT Time Calculation (min) (ACUTE ONLY): 23 min   Charges:   PT Evaluation $PT Eval Low Complexity: 1 Low   PT General Charges $$ ACUTE PT VISIT: 1 Visit         Darice Potters PT Acute Rehabilitation Services Office [phone]   Potters Darice Norris 03/02/2024, 1:38 PM

## 2024-03-02 NOTE — Progress Notes (Signed)
 Pt seen on routine central line rounds; pt had a TL PICC placed 02/29/24 in the [facility];  found the pt with gauze kling wrapped loosely around entire right arm - including her wrist;  significant amount of dried blood under the dressing; bruising also noted; dressing changed using sterile technique; pt has very tiny skin tear (size of a pea) at the bottom of the picc line dressing; RN aware; vaseline gauze drsg to site; pt noted to have significant edema in both arms;

## 2024-03-02 NOTE — Progress Notes (Signed)
 I was called to pt bedside by her nurse to place pt on BiPap for the night.  I placed pt on BiPap and the pt kept saying that she did not feel like she was getting enough air despite turning up the pressure.  I took the patient off the mask so she could communicate more clearly what was going on.  Pt stated that she did not feel like she was able to take a breath with the mask on and that she feels that she does better with just the nasal cannula for the night.  Placed pt back on her [facility] and informed her that I would inform her nurse.  Pt resting comfortably on 2L[place] with SPO2 100%. We will continue to monitor and treat.

## 2024-03-02 NOTE — Progress Notes (Signed)
 NAMESacred Marshall, MRN:  [id number], DOB:  09-Oct-1950, LOS: 3 ADMISSION DATE:  02/28/2024, CONSULTATION DATE:  02/29/24 REFERRING MD:  TRH, CHIEF COMPLAINT:  shortness of breath  History of Present Illness:  73 yo female presented with sob and b/l le edema, confusion, swelling of the right elbow since after a fall approximately 1 month ago. Pt was additionally recently admitted the end of June for similar symptoms with heart failure, pleural effusions/ pneumonia and subsequent hypotension requiring low dose vasopressors for adequate diuresis.   Today she was noted to be hypothermic to 95.6, tachypneic and hypoxic with sats in 70's. She was placed on NIV at that time to support her respiratory status. She was noted to have CM with vascular congestion and pleural effusions on cxr. Pt was also increasingly altered and somnolent and with her chronic use of a/c was sent for cth (which proved to be without acute abnormality). TRH had been asked to admit pt. She was given 20 mg lasix  for diuresis and admitted to progressive. However, which her persistent encephalopathy and concern for continued decline CCM was asked to eval. Pt was unable to provide a full review of symptoms and all information was obtained from chart review.   Ccm has been asked to eval with pt's worsening mental status, increasing somnolence and need for NIV.   Upon my eval pt was drowsy, on NIV. BP stable NOT on vasopressors. She was arousable and oriented to self and place.   Pertinent  Medical History  Anxiety Afib, on chronic eliquis  Combined systolic/diastolic heart failure Arthritis [id number]  Significant Hospital Events: Including procedures, antibiotic start and stop dates in addition to other pertinent events   8/18: Admitted to progressive care with TRH, transferred to stepdown for worsening mentation 8/19: Aline pulled out and replaced overnight, levophed  resumed and weaned off  Interim History / Subjective:   Patient feels better this morning. Endorses pain in left arm where she has ecchymosis from arterial line. She also endorses difficulty sleeping and is requesting her PRN Xanax .   Objective    Blood pressure 126/76, pulse 99, temperature 97.8 F (36.6 C), temperature source Oral, resp. rate 17, height 5' 2 (1.575 m), weight 63 kg, SpO2 99%. CVP:  [8 mmHg-22 mmHg] 20 mmHg  FiO2 (%):  [50 %] 50 %   Intake/Output Summary (Last 24 hours) at 03/02/2024 1459 Last data filed at 03/02/2024 1329 Gross per 24 hour  Intake 536.58 ml  Output 2700 ml  Net -2163.42 ml   Filed Weights   02/29/24 0315 03/01/24 0433 03/02/24 0500  Weight: 61.7 kg 63.5 kg 63 kg    Examination: General: chronically ill appearing, elderly female, no acute distress HENT: moist mucus membranes Lungs: breath sounds diminished bilateral bases Cardiovascular: irregular rhythm, rate 70-90's Abdomen: soft, non-tender, non-distended Extremities: 3+ pitting edema to dependent regions, 1-2+ in arms/legs, multiple skin wounds across extremities, left arm ecchymosis from just below wrist up shoulder  Neuro: Alert and oriented x 4, follows commands throughout  GU: deferred  WBC improved to 4.9 Cr improved 1.5 Hgb stable at 10.2  Resolved problem list   Assessment and Plan   Undifferentiated shock, resolved Leukopenia, resolved Met criteria for severe sepsis on admission given leukopenia, hypothermia, lactic acidosis and hypotension. ID consulted given recent treatment for endocarditis and possible vegetation on aortic valve. Per cardiology there is no vegetation on aortic valve. Infectious work-up negative thus far.  - Now off vasopressors, will remove arterial line. Of note, it  has been difficult to obtain accurate pressure with NIBP, best off left upper arm - Can consider midodrine if pressures remain soft  - Per ID stop linezolid  given negative MRSA PCR and completed 7-day course of Zosyn  for probable pneumonia - PICC  line had been placed to evaluate ScvO2 and CVP, can discontinue at this time if patient has 2 working PIVs - PCCM to sign off   Acute on chronic combined HF (systolic/diastolic) Echo with new mild RV dysfunction and small mobile density on aortic valve, small pericardial effusion and EF 30-35%. Per cardiology, no concern for vegetation on aortic valve. BNP on admission 3431.8.  - Continue IV diuresis per cardiology  - Plan to resume GDMT as tolerated by blood pressure prior to discharge  Chronic Afib (on Eliquis ) Remains rate controlled Afib.  - Resume home amiodarone  - Continue to hold home metoprolol  with borderline blood pressures - Stop heparin , resume home Eliquis   Acute hypoxic/hypercarbic respiratory failure, likely multifactorial due to volume overload and possible pneumonia, improving Suspect pulmonary edema and pleural effusions in the setting of acute on chronic combined HF and hypoalbuminemia.  - Now breathing comfortably with good gas exchange on 2L [place] - Wean oxygen  as able to maintain SpO2>92% - Treating pneumonia per above  Acute metabolic encephalopathy, improved Likely in the setting of hypoperfusion given hypotension. CTH NAICP. Mental status appears at baseline.  - Continue to monitor neuro exam  AKI on CKD Chronic hyponatremia Baseline Cr~1.0-1.2. AKI likely multifactorial in the setting of hypotension and heart failure exacerbation. Cr improving today. - Continue diuresis per above  Elevated TSH History of hyperthyroidism TSH 11.6 on 8/18, T4 normal. Likely subclinical hypothyroidism in the setting of acute illness. - Holding home methimazole   - Would recheck TFTs in 2-4 weeks  Malnutrition Hypoalbuminemia - Continue dysphagia diet  - RD consulted  Deconditioning  - PT/OT ordered   Best Practice (right click and Reselect all SmartList Selections daily)   Diet/type: dysphagia diet (see orders) DVT prophylaxis DOAC Pressure ulcer(s): present on  admission  GI prophylaxis: PPI Lines: A-line No longer needed.  Order written to d/c  Foley:  Yes, and it is still needed Code Status:  full code Last date of multidisciplinary goals of care discussion [8/18] - see separate IPAL note  Labs   CBC: Recent Labs  Lab 02/28/24 1825 02/29/24 0419 03/01/24 0440 03/02/24 0502  WBC 3.8* 3.6* 3.6* 4.9  NEUTROABS 2.7  --   --   --   HGB 12.9 12.0 10.6* 10.2*  HCT 43.3 39.7 33.1* 33.3*  MCV 86.4 87.3 85.3 86.7  PLT 180 150 143* 147*    Basic Metabolic Panel: Recent Labs  Lab 02/28/24 1825 02/29/24 0535 03/01/24 0751 03/02/24 0502  NA 133* 134* 136 131*  K 4.3 3.8 3.6 3.6  CL 87* 90* 92* 86*  CO2 30 32 33* 33*  GLUCOSE 128* 96 78 98  BUN 30* 30* 31* 28*  CREATININE 1.68* 1.65* 1.72* 1.55*  CALCIUM 9.6 9.0 8.8* 8.6*  MG 2.1  --   --  1.6*  PHOS  --   --   --  3.5   GFR: Estimated Creatinine Clearance: 27.8 mL/min (A) (by C-G formula based on SCr of 1.55 mg/dL (H)). Recent Labs  Lab 02/28/24 1825 02/28/24 1832 02/28/24 2041 02/29/24 0419 02/29/24 0535 03/01/24 0440 03/02/24 0502  PROCALCITON  --   --   --   --  <0.10  --   --   WBC 3.8*  --   --  3.6*  --  3.6* 4.9  LATICACIDVEN  --  2.8* 2.5*  --  1.3  --   --     Liver Function Tests: Recent Labs  Lab 02/28/24 1825 02/29/24 0535 03/01/24 0751 03/02/24 0502  AST 31 22 25 27   ALT 14 12 12 12   ALKPHOS 135* 105 113 103  BILITOT 1.4* 1.1 1.0 1.0  PROT 7.0 5.4* 5.5* 5.6*  ALBUMIN  1.9* 1.6* 1.7* 1.7*   No results for input(s): LIPASE, AMYLASE in the last 168 hours. No results for input(s): AMMONIA in the last 168 hours.  ABG    Component Value Date/Time   PHART 7.48 (H) 01/03/2024 1603   PCO2ART 42 01/03/2024 1603   PO2ART 111 (H) 01/03/2024 1603   HCO3 38.5 (H) 02/29/2024 0107   ACIDBASEDEF 2.4 (H) 12/31/2023 0430   O2SAT 72.5 02/29/2024 1608     Coagulation Profile: No results for input(s): INR, PROTIME in the last 168 hours.  Cardiac  Enzymes: No results for input(s): CKTOTAL, CKMB, CKMBINDEX, TROPONINI in the last 168 hours.  HbA1C: Hgb A1c MFr Bld  Date/Time Value Ref Range Status  08/16/2023 05:53 AM 6.1 (H) 4.8 - 5.6 % Final    Comment:    (NOTE) Pre diabetes:          5.7%-6.4%  Diabetes:              >6.4%  Glycemic control for   <7.0% adults with diabetes   03/12/2023 03:25 AM 6.1 (H) 4.8 - 5.6 % Final    Comment:    (NOTE) Pre diabetes:          5.7%-6.4%  Diabetes:              >6.4%  Glycemic control for   <7.0% adults with diabetes     CBG: No results for input(s): GLUCAP in the last 168 hours.      Rexene LOISE Blush, PA-C [facility] 03/02/24 2:59 PM  Please see [email or web address] for pager details.  From 7A-7P if no response, please call [phone] After hours, please call ELink [phone]

## 2024-03-02 NOTE — Progress Notes (Signed)
 [facility] Infectious Diseases Follow Up Note  Patient Identification: Patient Name: Rhonda Marshall MRN: [id number] Admit Date: 02/28/2024  5:41 PM Age: 73 y.o.Today's Date: 03/02/2024  Reason for Visit: ? AV vegetation  Principal Problem:   CHF exacerbation (HCC) Active Problems:   AKI (acute kidney injury) (HCC)   Acute hypoxemic respiratory failure (HCC)   Severe sepsis (HCC)   Acute encephalopathy   Shock (HCC)   Leukopenia   Aortic valve vegetation   H/O endocarditis  Antibiotics:  Vancomycin  8/17, Linezolid  8/19- Zosyn  8/17-c   Lines/Hardware: Rt midline, Left radial arterial line     Interval Events: Remains afebrile Labs remarkable for creatinine improved to 1.55, WBC 4.9, platelets 147   Assessment 73 year old female with prior history of A-fib on AC, RA, HTN, subclinical hyperthyroidism, CHF, CKD, RT THA, 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   # Acute hypoxic respiratory failure - 2/2 volume overload/pul edema with also concerns for aspiration due to esophageal dysmotility - Volume management as above - Oxygen  requirement down to 2L [place]   # AKI on CKD - monitor Cr   # ? Vegetation in the AV vs Lambl's excrescence - Recent Enterococcus hirae/staph epidermidis bacteremia s/p 6 weeks of IV ampicillin  and ceftriaxone  on 02/13/2024 and off antibiotics - I discussed TTE findings with Cardiology Dr Maude JAYSON Emmer who said  She does not have a vegetation original read was an over call  - Of note, blood cx 8/17 ( before antibiotics)  as well as 8/18 ( after antibiotics) NGTD which additional supports unlikely to have endocarditis  Recommendations - DC linezolid  as MRSA PCR negative  - complete 7 days of zosyn  for probable PNA. No need to treat for  endocarditis given above.  - fluid and electrolyte management per primary team and cardiology - d/w Cardiology team  ID will sign off, recall back with questions or concerns  Rest of the management as per the primary team. Thank you for the consult. Please page with pertinent questions or concerns.  ______________________________________________________________________ Subjective patient seen and examined at the bedside. Complains of fluid leaking from left forearm  Vitals BP 121/67   Pulse 97   Temp 98 F (36.7 C) (Oral)   Resp (!) 22   Ht 5' 2 (1.575 m)   Wt 63 kg   SpO2 100%   BMI 25.40 kg/m     Physical Exam Constitutional: Elderly female sitting up in the bed, not in acute distress    Comments: HEENT WNL  Cardiovascular:     Rate and Rhythm: Normal rate and regular rhythm.     Heart sounds:   Pulmonary:     Effort: Pulmonary effort is normal on 2 L nasal cannula    Comments: Decreased air entry in the bases  Abdominal:     Palpations: Abdomen is soft.     Tenderness: Nontender and nondistended  Musculoskeletal:        General: Mild pedal edema  Skin:    Comments: Bruise in the left forearm, mild swelling but no cellulitis/no signs of infection, Rt heel DU with no signs of infection   Neurological:     General: Awake, alert and oriented, nonfocal  Psychiatric:        Mood and Affect: Mood normal.   Pertinent Microbiology Results for orders placed or performed during the  hospital encounter of 02/28/24  Culture, blood (routine x 2)     Status: None (Preliminary result)   Collection Time: 02/28/24  6:25 PM   Specimen: BLOOD  Result Value Ref Range Status   Specimen Description   Final    BLOOD BLOOD LEFT ARM Performed at [facility], [address], [place], [place] [address]    Special Requests   Final    BOTTLES DRAWN AEROBIC AND ANAEROBIC Blood Culture adequate volume Performed at [facility], [address] [place], [place] [address]    Culture   Final    NO GROWTH 2 DAYS Performed at [facility], [address] [place], [place] [address]    Report Status PENDING  Incomplete  Resp panel by RT-PCR (RSV, Flu A&B, Covid) Anterior Nasal Swab     Status: None   Collection Time: 02/28/24  6:30 PM   Specimen: Anterior Nasal Swab  Result Value Ref Range Status   SARS Coronavirus 2 by RT PCR NEGATIVE NEGATIVE Final    Comment: (NOTE) SARS-CoV-2 target nucleic acids are NOT DETECTED.  The SARS-CoV-2 RNA is generally detectable in upper respiratory specimens during the acute phase of infection. The lowest concentration of SARS-CoV-2 viral copies this assay can detect is 138 copies/mL. A negative result does not preclude SARS-Cov-2 infection and should not be used as the sole basis for treatment or other patient management decisions. A negative result may occur with  improper specimen collection/handling, submission of specimen other than nasopharyngeal swab, presence of viral mutation(s) within the areas targeted by this assay, and inadequate number of viral copies(<138 copies/mL). A negative result must be combined with clinical observations, patient history, and epidemiological information. The expected result is Negative.  Fact Sheet for Patients:  BloggerCourse.com  Fact Sheet for Healthcare Providers:  SeriousBroker.it  This test is no t yet approved or cleared by the [place]  FDA and  has been authorized for detection and/or diagnosis of SARS-CoV-2 by FDA under an Emergency Use Authorization (EUA). This EUA will remain  in effect (meaning this test can be used) for the duration of the COVID-19 declaration under Section 564(b)(1) of the Act, 21 U.S.C.section 360bbb-3(b)(1), unless the authorization is terminated  or revoked sooner.       Influenza A by PCR NEGATIVE NEGATIVE Final   Influenza B by PCR NEGATIVE  NEGATIVE Final    Comment: (NOTE) The Xpert Xpress SARS-CoV-2/FLU/RSV plus assay is intended as an aid in the diagnosis of influenza from Nasopharyngeal swab specimens and should not be used as a sole basis for treatment. Nasal washings and aspirates are unacceptable for Xpert Xpress SARS-CoV-2/FLU/RSV testing.  Fact Sheet for Patients: BloggerCourse.com  Fact Sheet for Healthcare Providers: SeriousBroker.it  This test is not yet approved or cleared by the [place]  FDA and has been authorized for detection and/or diagnosis of SARS-CoV-2 by FDA under an Emergency Use Authorization (EUA). This EUA will remain in effect (meaning this test can be used) for the duration of the COVID-19 declaration under Section 564(b)(1) of the Act, 21 U.S.C. section 360bbb-3(b)(1), unless the authorization is terminated or revoked.     Resp Syncytial Virus by PCR NEGATIVE NEGATIVE Final    Comment: (NOTE) Fact Sheet for Patients: BloggerCourse.com  Fact Sheet for Healthcare Providers: SeriousBroker.it  This test is not yet approved or cleared by the [place]  FDA and has been authorized for detection and/or diagnosis of SARS-CoV-2 by FDA under an Emergency Use Authorization (  EUA). This EUA will remain in effect (meaning this test can be used) for the duration of the COVID-19 declaration under Section 564(b)(1) of the Act, 21 U.S.C. section 360bbb-3(b)(1), unless the authorization is terminated or revoked.  Performed at [facility], [address] [place], [place] [address]   MRSA Next Gen by PCR, Nasal     Status: None   Collection Time: 02/29/24  3:35 AM   Specimen: Nasal Mucosa; Nasal Swab  Result Value Ref Range Status   MRSA by PCR Next Gen NOT DETECTED NOT DETECTED Final    Comment: (NOTE) The GeneXpert MRSA Assay (FDA approved for NASAL specimens only), is  one component of a comprehensive MRSA colonization surveillance program. It is not intended to diagnose MRSA infection nor to guide or monitor treatment for MRSA infections. Test performance is not FDA approved in patients less than 50 years old. Performed at [facility], [address], [place], [place] [address]   Culture, blood (routine x 2)     Status: None (Preliminary result)   Collection Time: 02/29/24  5:35 AM   Specimen: BLOOD  Result Value Ref Range Status   Specimen Description   Final    BLOOD BLOOD RIGHT ARM Performed at [facility], [address], [place], [place] [address]    Special Requests   Final    BOTTLES DRAWN AEROBIC AND ANAEROBIC Blood Culture adequate volume Performed at [facility], [address], [place], [place] [address]    Culture   Final    NO GROWTH 1 DAY Performed at [facility], [address], [place], [place] [address]    Report Status PENDING  Incomplete   Pertinent Lab.    Latest Ref Rng & Units 03/02/2024    5:02 AM 03/01/2024    4:40 AM 02/29/2024    4:19 AM  CBC  WBC 4.0 - 10.5 K/uL 4.9  3.6  3.6   Hemoglobin 12.0 - 15.0 g/dL 89.7  89.3  87.9   Hematocrit 36.0 - 46.0 % 33.3  33.1  39.7   Platelets 150 - 400 K/uL 147  143  150       Latest Ref Rng & Units 03/02/2024    5:02 AM 03/01/2024    7:51 AM 02/29/2024    5:35 AM  CMP  Glucose 70 - 99 mg/dL 98  78  96   BUN 8 - 23 mg/dL 28  31  30    Creatinine 0.44 - 1.00 mg/dL 8.44  8.27  8.34   Sodium 135 - 145 mmol/L 131  136  134   Potassium 3.5 - 5.1 mmol/L 3.6  3.6  3.8   Chloride 98 - 111 mmol/L 86  92  90   CO2 22 - 32 mmol/L 33  33  32   Calcium 8.9 - 10.3 mg/dL 8.6  8.8  9.0   Total Protein 6.5 - 8.1 g/dL 5.6  5.5  5.4   Total Bilirubin 0.0 - 1.2 mg/dL 1.0  1.0  1.1   Alkaline Phos 38 - 126 U/L 103  113  105   AST 15 - 41 U/L 27  25  22    ALT 0 - 44 U/L 12  12  12     Pertinent Imaging today Plain films and  CT images have been personally visualized and interpreted; radiology reports have been reviewed. Decision making incorporated into the Impression /   No results found.  I  spent 50 minutes involved in face-to-face and non-face-to-face activities for this patient on the day of the visit. Professional time spent includes the following activities: Preparing to see the patient (review of tests), Obtaining and reviewing separately obtained history (PCCM progress note), Performing a medically appropriate examination and evaluation , Ordering medications/labs, referring and communicating with other health care professionals including pharmacy, Documenting clinical information in the EMR, Independently interpreting results (not separately reported), Communicating results to the patient, Counseling and educating the patient and Care coordination (not separately reported).   Plan d/w requesting provider as well as ID pharm D  Of note, portions of this note may have been created with voice recognition software. While this note has been edited for accuracy, occasional wrong-word or 'sound-a-like' substitutions may have occurred due to the inherent limitations of voice recognition software.   Electronically signed by:   Annalee Orem, MD Infectious Disease Physician [facility] Infectious Disease Pager: [phone]

## 2024-03-02 NOTE — Progress Notes (Addendum)
 Progress Note  Patient Name: Rhonda Marshall Date of Encounter: 03/02/2024  [facility] Cardiologist: Lynwood Schilling, MD   Interval Summary   Has shortness of breath but feels like it has improved. Continues to have orthopnea.  Vital Signs Vitals:   03/02/24 0800 03/02/24 0841 03/02/24 0900 03/02/24 1000  BP: 128/85  128/76 114/86  Pulse: 97 92 (!) 105 99  Resp: (!) 22 20 19 17   Temp: 98 F (36.7 C)     TempSrc: Oral     SpO2: 100% 100% 100% 100%  Weight:      Height:        Intake/Output Summary (Last 24 hours) at 03/02/2024 1017 Last data filed at 03/02/2024 0600 Gross per 24 hour  Intake 551.95 ml  Output 1700 ml  Net -1148.05 ml      03/02/2024    5:00 AM 03/01/2024    4:33 AM 02/29/2024    3:15 AM  Last 3 Weights  Weight (lbs) 138 lb 14.2 oz 139 lb 15.9 oz 136 lb 0.4 oz  Weight (kg) 63 kg 63.5 kg 61.7 kg      Telemetry/ECG  Atrial fibrillation with heart rates in the 70s to 90s- Personally Reviewed  Physical Exam  GEN: No acute distress.   Neck: + JVD to jaw Cardiac: RRR, no murmurs, rubs, or gallops.  Respiratory: crackles heard throughout the lungs GI: Soft, nontender, non-distended  MS: 2+ bilateral edema  Assessment & Plan  Is a 73 year old female with past medical history of permanent atrial fibrillation on Eliquis , HFrEF, CKD stage III, and hypothyroidism  Permanent atrial fibrillation CHA2DS2-VASc Score = 4 [CHF History: 1, HTN History: 0, Diabetes History: 0, Stroke History: 0, Vascular Disease History: 1, Age Score: 1, Gender Score: 1].  Therefore, the patient's annual risk of stroke is 4.8 %.   PTA was on Eliquis  and 100 mg of amiodarone  daily.  Amiodarone  was held due to concerns with the patient's thyroid . Heart rates remain well-controlled in the 70s to 90s.  Continue IV heparin .  Plan to restart Eliquis  prior to discharge.  HFrEF Left bundle branch block Was hospitalized for shortness of breath, bilateral lower extremity edema,  and confusion, and right elbow swelling.  BMP was elevated at 3431. Echocardiogram this hospitalization showed a reduced LVEF of 30 to 35%,  mild concentric LVH, mildly reduced RV function, and mobile density.  Was reviewed by Dr. Delford and felt unlikely to be vegetation. Has received 2 doses of IV Lasix .  I's and O's are down but doubt I's and O's are accurate as patient has a diet order in and no oral intake documented.  Patient's weight is up from admission.  Patient reported her baseline weight is about 100 pounds.  At office visit on 11/19/2023 patient weighed 107 pounds.  Most recent weight was 138 pounds.  She is currently up about 30 pounds from that weight.  She appears volume overloaded on exam.  PTA was on losartan  25 mg daily, and Toprol  XL 50 mg twice daily.  GDMT was held as the patient was in shock Start Lasix  40 mg twice daily Strict I's and O's Order chest x-ray. GDMT Will continue to hold GDMT for now due to concerns of shock and will prioritize diuresis for now.  Plan to restart GDMT prior to discharge   Hypoalbuminemia Albumin  1.7.  This is likely contributing to increased edema.   mixed shock acute hypoxic respiratory failure Blood pressures have improved recent On antibiotics per primary  Otherwise manage per primary     For questions or updates, please contact [facility] Please consult [email or web address] for contact info under       Signed, Luise Yamamoto, PA-C

## 2024-03-02 NOTE — Evaluation (Signed)
 Occupational Therapy Evaluation Patient Details Name: Rhonda Marshall MRN: [id number] DOB: 08-20-1950 Today's Date: 03/02/2024   History of Present Illness   73 yr old female admitted 02/28/24 with SOB, hypoxia, bilateral lower extremity edema, confusion, and right elbow swelling. Found to have acute on chronic heart failure, acute respiratory failure, and sepsis due to PNA. PMH: AFIB, RA, anxiety, hop fracture, right elb fracture.     Clinical Impressions The pt is currently presenting with the below listed deficits (see OT problem list). As such, her occupational performance is compromised and she requires assist for self-care management. During the session, she required mod assist for supine to sit, mod assist for simulated lower body dressing in sitting, and mod assist x2 to stand-pivot to the bedside chair. She was also noted to be with general deconditioning, unsteadiness in standing, and LUE edema. She reported pain and hypersensitivity to her LUE. She was occasionally distractible, due to being hyperverbose. She required consistent redirection to tasks and topics and occasional repetition of prompts. She will benefit from further OT services to maximize her safety and independence with self-care tasks. Patient will benefit from continued inpatient follow up therapy, <3 hours/day.        If plan is discharge home, recommend the following:   A lot of help with bathing/dressing/bathroom;Direct supervision/assist for medications management;Direct supervision/assist for financial management;Help with stairs or ramp for entrance;Assistance with cooking/housework;Assist for transportation;A lot of help with walking and/or transfers     Functional Status Assessment   Patient has had a recent decline in their functional status and demonstrates the ability to make significant improvements in function in a reasonable and predictable amount of time.     Equipment Recommendations   Other  (comment) (defer to next level of care)     Recommendations for Other Services         Precautions/Restrictions   Precautions Precautions: Fall Restrictions Weight Bearing Restrictions Per Provider Order: No     Mobility Bed Mobility Overal bed mobility: Needs Assistance Bed Mobility: Supine to Sit     Supine to sit: Mod assist     General bed mobility comments: assist trunk and to scoot to bed edge    Transfers Overall transfer level: Needs assistance Equipment used: 2 person hand held assist Transfers: Sit to/from Stand, Bed to chair/wheelchair/BSC Sit to Stand: Mod assist, +2 physical assistance, +2 safety/equipment     Step pivot transfers: Mod assist, +2 physical assistance, +2 safety/equipment     General transfer comment: extra time, support for balance to stap to recliner      Balance     Sitting balance-Leahy Scale: Fair       Standing balance-Leahy Scale: Poor              ADL either performed or assessed with clinical judgement   ADL Overall ADL's : Needs assistance/impaired Eating/Feeding: Set up;Sitting   Grooming: Minimal assistance;Sitting           Upper Body Dressing : Minimal assistance;Sitting   Lower Body Dressing: Moderate assistance;Sitting/lateral leans       Toileting- Clothing Manipulation and Hygiene: Maximal assistance Toileting - Clothing Manipulation Details (indicate cue type and reason): at bedside commode level, based on clinical judgement                          Pertinent Vitals/Pain Pain Assessment Pain Assessment: Faces Pain Location: L arm Pain Intervention(s): Limited activity within patient's tolerance, Monitored during session,  Repositioned     Extremity/Trunk Assessment Upper Extremity Assessment Upper Extremity Assessment: Generalized weakness;RUE deficits/detail;LUE deficits/detail RUE Deficits / Details: AROM WFL. Edema noted. Chronic arthritic changes of hand LUE Deficits /  Details: AROM WFL. Edema noted. Chronic arthritic changes of hand   Lower Extremity Assessment Lower Extremity Assessment: Generalized weakness     Communication Communication Communication: Impaired Factors Affecting Communication: Hearing impaired   Cognition Arousal: Alert Behavior During Therapy: WFL for tasks assessed/performed Cognition: Difficult to assess     Awareness: Online awareness impaired     Executive functioning impairment (select all impairments): Organization OT - Cognition Comments: Oriented to person, place, month, and year. Pt was occasionally distractible due to being hyperverbose. She required consistent redirection to tasks and topics and occasional repetition of prompts.                 Following commands: Impaired Following commands impaired: Follows one step commands inconsistently     Cueing  General Comments   Cueing Techniques: Verbal cues;Gestural cues;Tactile cues              Home Living Family/patient expects to be discharged to:: Private residence Living Arrangements: Children (Son and daughter-in-law) Available Help at Discharge: Family Type of Home: House Home Access: Stairs to enter Secretary/administrator of Steps: 3 Entrance Stairs-Rails: Can reach both Home Layout: Two level;Able to live on main level with bedroom/bathroom Alternate Level Stairs-Number of Steps: her bedroom is on the main level   Bathroom Shower/Tub: Producer, television/film/video: Standard     Home Equipment: Pharmacist, hospital (2 wheels);BSC/3in1;Wheelchair - manual   Additional Comments: son and dtr in law assistassist      Prior Functioning/Environment Prior Level of Function : Needs assist    *Information reported regarding pt's prior level of functioning and living situation may need to be verified, as the pt had occasional difficulty providing detailed accounts related to this.          Mobility Comments:  (Pt reported  requiring assist from her daughter-in-law to ambulate a few steps inside the home.) ADLs Comments:  (She reported requiring assistance for toileting and bathing from family, though she had difficulty providing detailed accounts in this regard.)    OT Problem List: Decreased strength;Decreased activity tolerance;Impaired balance (sitting and/or standing);Decreased coordination;Pain;Increased edema   OT Treatment/Interventions: Self-care/ADL training;Therapeutic exercise;Therapeutic activities;Energy conservation;Patient/family education;DME and/or AE instruction;Balance training      OT Goals(Current goals can be found in the care plan section)   Acute Rehab OT Goals OT Goal Formulation: With patient Time For Goal Achievement: 03/16/24 Potential to Achieve Goals: Good ADL Goals Pt Will Perform Upper Body Dressing: with set-up;sitting Pt Will Transfer to Toilet: with supervision;stand pivot transfer;bedside commode Pt Will Perform Toileting - Clothing Manipulation and hygiene: with supervision;sit to/from stand Additional ADL Goal #1: The pt will perform bed mobility with supervision, in prep for progressive ADL participation.   OT Frequency:  Min 2X/week    Co-evaluation   Reason for Co-Treatment: Complexity of the patient's impairments (multi-system involvement);For patient/therapist safety PT goals addressed during session: Mobility/safety with mobility OT goals addressed during session: ADL's and self-care      AM-PAC OT 6 Clicks Daily Activity     Outcome Measure Help from another person eating meals?: None Help from another person taking care of personal grooming?: A Little Help from another person toileting, which includes using toliet, bedpan, or urinal?: A Lot Help from another person bathing (including washing, rinsing, drying)?:  A Lot Help from another person to put on and taking off regular upper body clothing?: A Little Help from another person to put on and taking  off regular lower body clothing?: A Lot 6 Click Score: 16   End of Session Equipment Utilized During Treatment: Gait belt Nurse Communication: Mobility status  Activity Tolerance: Other (comment) (Fair tolerance) Patient left: in chair;with call bell/phone within reach;with chair alarm set  OT Visit Diagnosis: Unsteadiness on feet (R26.81);Other abnormalities of gait and mobility (R26.89);Muscle weakness (generalized) (M62.81);Pain Pain - Right/Left: Left Pain - part of body: Arm                Time: 8843-8780 OT Time Calculation (min): 23 min Charges:  OT General Charges $OT Visit: 1 Visit OT Evaluation $OT Eval Moderate Complexity: 1 Mod    Darcell Yacoub J Harris, OTR/L 03/02/2024, 5:04 PM

## 2024-03-03 DIAGNOSIS — I482 Chronic atrial fibrillation, unspecified: Secondary | ICD-10-CM | POA: Diagnosis not present

## 2024-03-03 DIAGNOSIS — I5023 Acute on chronic systolic (congestive) heart failure: Secondary | ICD-10-CM | POA: Diagnosis not present

## 2024-03-03 DIAGNOSIS — I5043 Acute on chronic combined systolic (congestive) and diastolic (congestive) heart failure: Secondary | ICD-10-CM | POA: Diagnosis not present

## 2024-03-03 DIAGNOSIS — G9340 Encephalopathy, unspecified: Secondary | ICD-10-CM | POA: Diagnosis not present

## 2024-03-03 DIAGNOSIS — J9601 Acute respiratory failure with hypoxia: Secondary | ICD-10-CM | POA: Diagnosis not present

## 2024-03-03 DIAGNOSIS — R579 Shock, unspecified: Secondary | ICD-10-CM

## 2024-03-03 LAB — COMPREHENSIVE METABOLIC PANEL WITH GFR
ALT: 14 U/L (ref 0–44)
AST: 27 U/L (ref 15–41)
Albumin: 1.9 g/dL — ABNORMAL LOW (ref 3.5–5.0)
Alkaline Phosphatase: 103 U/L (ref 38–126)
Anion gap: 13 (ref 5–15)
BUN: 25 mg/dL — ABNORMAL HIGH (ref 8–23)
CO2: 33 mmol/L — ABNORMAL HIGH (ref 22–32)
Calcium: 8.8 mg/dL — ABNORMAL LOW (ref 8.9–10.3)
Chloride: 85 mmol/L — ABNORMAL LOW (ref 98–111)
Creatinine, Ser: 1.22 mg/dL — ABNORMAL HIGH (ref 0.44–1.00)
GFR, Estimated: 47 mL/min — ABNORMAL LOW (ref >60)
Glucose, Bld: 93 mg/dL (ref 70–99)
Potassium: 4.1 mmol/L (ref 3.5–5.1)
Sodium: 131 mmol/L — ABNORMAL LOW (ref 135–145)
Total Bilirubin: 1 mg/dL (ref 0.0–1.2)
Total Protein: 6.5 g/dL (ref 6.5–8.1)

## 2024-03-03 LAB — CBC
HCT: 33.5 % — ABNORMAL LOW (ref 36.0–46.0)
Hemoglobin: 10 g/dL — ABNORMAL LOW (ref 12.0–15.0)
MCH: 26.2 pg (ref 26.0–34.0)
MCHC: 29.9 g/dL — ABNORMAL LOW (ref 30.0–36.0)
MCV: 87.9 fL (ref 80.0–100.0)
Platelets: 137 K/uL — ABNORMAL LOW (ref 150–400)
RBC: 3.81 MIL/uL — ABNORMAL LOW (ref 3.87–5.11)
RDW: 21.9 % — ABNORMAL HIGH (ref 11.5–15.5)
WBC: 6.4 K/uL (ref 4.0–10.5)
nRBC: 0 % (ref 0.0–0.2)

## 2024-03-03 LAB — MAGNESIUM: Magnesium: 1.7 mg/dL (ref 1.7–2.4)

## 2024-03-03 LAB — PHOSPHORUS: Phosphorus: 3.7 mg/dL (ref 2.5–4.6)

## 2024-03-03 MED ORDER — METOPROLOL SUCCINATE ER 50 MG PO TB24
50.0000 mg | ORAL_TABLET | Freq: Every day | ORAL | Status: DC
  Administered 2024-03-04 – 2024-03-08 (×4): 50 mg via ORAL
  Filled 2024-03-03 (×6): qty 1

## 2024-03-03 MED ORDER — PANTOPRAZOLE SODIUM 40 MG PO TBEC
40.0000 mg | DELAYED_RELEASE_TABLET | Freq: Every day | ORAL | Status: DC
  Administered 2024-03-04 – 2024-03-08 (×5): 40 mg via ORAL
  Filled 2024-03-03 (×5): qty 1

## 2024-03-03 NOTE — Progress Notes (Signed)
 Progress Note   Patient: Rhonda Marshall FMW:[id number] DOB: 07-16-1950 DOA: 02/28/2024     4 DOS: the patient was seen and examined on 03/03/2024   Brief hospital course: Rhonda Marshall is a 73 year old woman with past medical history of hypertension, combined systolic and diastolic CHF, chronic A-fib who was admitted with confusion, hypoxemic respiratory failure with bilateral infiltrates most consistent with exacerbation of HFrEF. She was treated with diuretics, empiric antibiotics for possible pneumonia. Transiently required BiPAP, now stable transferred [facility] to [facility] 03/03/24  Assessment and Plan: Acute hypoxic hypercapnic respiratory failure- In the setting of CHF flare up, pneumonia- Was on Bipap on presentation. She is now on 2L nasal cannula.  Continue IV zosyn , lasix , bronchodilators as needed. 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  Per cardiology there is no vegetation on aortic valve. Infectious work-up negative thus far.  BP improved, off pressors. Was on vanc+linezolid . Continue zosyn  for 7 days per ID.  Acute on chronic systolic and diastolic CHF- She got IV lasix  40mg  twice daily. Cardiology advised to change lasix  to oral 40mg  bid. Started he ron Toprol  50 daily (takes bid at home) Further GDMT addition if BP improves. Monitor daily weights, strict input and output. Monitor daily renal function, electrolytes. Cardiology signed off, outpatient follow up as scheduled.  Acute on CKD stage 3a Creatinine now baseline. Continue to monitor renal function. Hold losartan . Avoid nephrotoxic drugs.  Acute metabolic encephalopathy- Due to shock, CHF, pneumonia. Mental status improved. She is back to baseline. Continue delirium precautions.  Chronic afib- On metoprolol  and eliquis .  Hyperthyroidism- THS>11 Hold Methimazole  for now. Per  family her methimazole  recently decreased to 2.5mg . She will need to follow up with endocrine with repeat thyroid  functions.  Hyponatremia- In the setting of hypervolemia Seems chronic. Sodium 131, stable.  Hypoalbuminemia Failure to thrive. Debility- PT/ OT follow up. Family decline SNF. She will need [facility] services. Orders placed.       Out of bed to chair. Incentive spirometry. Nursing supportive care. Fall, aspiration precautions. Diet:  Diet Orders (From admission, onward)     Start     Ordered   02/29/24 1658  DIET DYS 3 Room service appropriate? Yes; Fluid consistency: Thin  Diet effective now       Question Answer Comment  Room service appropriate? Yes   Fluid consistency: Thin      02/29/24 1657           DVT prophylaxis:  apixaban  (ELIQUIS ) tablet 5 mg  Level of care: Progressive   Code Status: Do not attempt resuscitation (DNR) PRE-ARREST INTERVENTIONS DESIRED  Subjective: Patient is seen and examined today morning. She is weak, sitting on edge of bed. On 2L supplemental o2. Left arm large bruise noted. Daughter at bedside.  Physical Exam: Vitals:   03/03/24 0410 03/03/24 0500 03/03/24 1022 03/03/24 1451  BP: (!) 128/53  95/73 105/85  Pulse: 98  (!) 106 100  Resp: 16   18  Temp: 98.3 F (36.8 C)   98.8 F (37.1 C)  TempSrc: Oral   Oral  SpO2: 95%   94%  Weight:  59.9 kg    Height:        General - Elderly ill Caucasian female, mild respiratory distress HEENT - PERRLA, EOMI, atraumatic head, non tender sinuses. Lung - Clear, basal rales, rhonchi, no wheezes. Heart - S1, S2 heard, no murmurs, rubs, trace pedal edema. Abdomen -  Soft, non tender, bowel sounds good Neuro - Alert, awake and oriented x 3, non focal exam. Skin - Warm and dry. Large left arm bruise, swelling noted.  Data Reviewed:      Latest Ref Rng & Units 03/03/2024    3:10 AM 03/02/2024    5:02 AM 03/01/2024    4:40 AM  CBC  WBC 4.0 - 10.5 K/uL 6.4  4.9  3.6   Hemoglobin  12.0 - 15.0 g/dL 89.9  89.7  89.3   Hematocrit 36.0 - 46.0 % 33.5  33.3  33.1   Platelets 150 - 400 K/uL 137  147  143       Latest Ref Rng & Units 03/03/2024    3:10 AM 03/02/2024    5:02 AM 03/01/2024    7:51 AM  BMP  Glucose 70 - 99 mg/dL 93  98  78   BUN 8 - 23 mg/dL 25  28  31    Creatinine 0.44 - 1.00 mg/dL 8.77  8.44  8.27   Sodium 135 - 145 mmol/L 131  131  136   Potassium 3.5 - 5.1 mmol/L 4.1  3.6  3.6   Chloride 98 - 111 mmol/L 85  86  92   CO2 22 - 32 mmol/L 33  33  33   Calcium 8.9 - 10.3 mg/dL 8.8  8.6  8.8    DG Chest 1 View Result Date: 03/02/2024 CLINICAL DATA:  Acute hypoxic respiratory failure. EXAM: CHEST  1 VIEW COMPARISON:  Chest radiograph dated 03/02/2024. FINDINGS: Right-sided PICC in similar position. Bilateral pleural effusions, right greater than left with associated compressive atelectasis of the lung bases versus pneumonia. No pneumothorax. Stable cardiac silhouette no acute osseous pathology. IMPRESSION: Bilateral pleural effusions, right greater than left with associated compressive atelectasis of the lung bases versus pneumonia. Electronically Signed   By: Vanetta Chou M.D.   On: 03/02/2024 15:42   DG Chest Port 1 View Result Date: 03/02/2024 CLINICAL DATA:  Heart failure EXAM: PORTABLE CHEST 1 VIEW COMPARISON:  02/29/2024 FINDINGS: Stable right PICC tip overlies the mid to lower SVC. Low lung volumes. Similar small-to-moderate right and small left pleural effusions with bibasilar opacities. No pneumothorax. The visualized osseous structures are unchanged. IMPRESSION: Low lung volumes. Similar small-to-moderate right and small left pleural effusions with bibasilar opacities. Electronically Signed   By: Harrietta Sherry M.D.   On: 03/02/2024 11:33    Family Communication: Discussed with patient, daughter. They understand and agree. All questions answered.  Disposition: Status is: Inpatient Remains inpatient appropriate because: diuresis, IV antibiotics,  debility,   Planned Discharge Destination: Home with Home Health     Time spent: 51 minutes  Author: Concepcion Riser, MD 03/03/2024 8:03 PM Secure chat 7am to 7pm For on call review [email or web address].

## 2024-03-03 NOTE — Progress Notes (Signed)
 Occupational Therapy Treatment Patient Details Name: Rhonda Marshall MRN: [id number] DOB: October 04, 1950 Today's Date: 03/03/2024   History of present illness 73 yr old female admitted 02/28/24 with SOB, hypoxia, bilateral lower extremity edema, confusion, and right elbow swelling. Pt found to have acute encephalopathy and septic shock.SABRA  BMP was elevated at 3431, EF 30-35%. PMH: AFIB, RA, anxiety, hop fracture, right elb fracture.   OT comments  The pt was seen for functional strengthening and progression of functional activity. She required mod assist for supine to sit, as well as mod assist to stand and step-pivot to the bedside chair. She further performed upper body grooming in sitting with set-up/supervision. She was also instructed on simple therapeutic exercises for strengthening needed to facilitate progressive ADL performance. She reported quick fatigue with activity, which has been ongoing for a while. The pt and her daughter-in-law reported acute LUE pain with hand ROM deficits, which they believe to be attributable to a prior IV placed and removed during this hospital stay. Pt also reported distal LUE partial and spotty numbness. Pt was unable to form a fully closed fist actively. AROM for shoulder flexion noted to be ~60% of normal AROM. MD was in the room with pt's daughter-in-law informing him of this. Continue OT plan of care. Short-term SNF rehab is recommended, however the pt's daughter-in-law declines this and plans to take the pt home at discharge. If the pt returns home at discharge, home health OT & consistent family assistance is recommended       If plan is discharge home, recommend the following:  A lot of help with bathing/dressing/bathroom;Direct supervision/assist for medications management;Direct supervision/assist for financial management;Help with stairs or ramp for entrance;Assistance with cooking/housework;Assist for transportation;A lot of help with walking and/or  transfers   Equipment Recommendations  BSC/3in1    Recommendations for Other Services      Precautions / Restrictions Precautions Precautions: Fall Restrictions Weight Bearing Restrictions Per Provider Order: No       Mobility Bed Mobility Overal bed mobility: Needs Assistance Bed Mobility: Supine to Sit     Supine to sit: Mod assist, HOB elevated, Used rails          Transfers Overall transfer level: Needs assistance Equipment used: 1 person hand held assist Transfers: Sit to/from Stand Sit to Stand: From elevated surface, Mod assist     Step pivot transfers: Mod assist     General transfer comment: extra time, support for balance to stap to recliner     Balance     Sitting balance-Leahy Scale: Fair       Standing balance-Leahy Scale: Poor            ADL either performed or assessed with clinical judgement   ADL Overall ADL's : Needs assistance/impaired     Grooming: Minimal assistance;Sitting Grooming Details (indicate cue type and reason): The pt performed face washing and hair coming in sitting at chair level. She required min assist for thoroughness with hair combing and SBA for face washing.               Lower Body Dressing Details (indicate cue type and reason): She required assist to donn her socks in bed.           Extremity/Trunk Assessment Upper Extremity Assessment Upper Extremity Assessment: LUE deficits/detail LUE Deficits / Details: Edema and bruising noted. Pt and her daughter-in-law reported acute pain and hand ROM deficits, which they believe to be attributable to a prior IV placed and removed  during this hospital stay. Pt also reported distal UE partial and spotty numbness. Pt was unable to form a fully closed fist actively. AROM for shoulder flexion noted to be ~60% of normal AROM.                     Communication Communication Factors Affecting Communication: Hearing impaired   Cognition Arousal:  Alert Behavior During Therapy: WFL for tasks assessed/performed Cognition: Difficult to assess     Awareness: Online awareness impaired     Executive functioning impairment (select all impairments): Organization, Problem solving OT - Cognition Comments: She required occasional redirection to tasks and topics.        Following commands impaired:  (Follows 1 step commands most of the time, with difficulty appearing to be at least partly due to her distractibility.)      Cueing   Cueing Techniques: Verbal cues, Gestural cues, Tactile cues             Pertinent Vitals/ Pain       Pain Assessment Pain Assessment: Faces Pain Score: 5  Pain Location: L arm Pain Intervention(s): Monitored during session, Repositioned   Frequency  Min 2X/week        Progress Toward Goals  OT Goals(current goals can now be found in the care plan section)     Acute Rehab OT Goals OT Goal Formulation: With patient/family Time For Goal Achievement: 03/16/24 Potential to Achieve Goals: Good  Plan         AM-PAC OT 6 Clicks Daily Activity     Outcome Measure   Help from another person eating meals?: None Help from another person taking care of personal grooming?: A Little Help from another person toileting, which includes using toliet, bedpan, or urinal?: A Lot Help from another person bathing (including washing, rinsing, drying)?: A Lot Help from another person to put on and taking off regular upper body clothing?: A Little Help from another person to put on and taking off regular lower body clothing?: A Lot 6 Click Score: 16    End of Session Equipment Utilized During Treatment: Gait belt  OT Visit Diagnosis: Unsteadiness on feet (R26.81);Other abnormalities of gait and mobility (R26.89);Muscle weakness (generalized) (M62.81);Pain Pain - Right/Left: Left Pain - part of body: Arm   Activity Tolerance Other (comment);Patient limited by pain (Fair tolerance)   Patient Left in  chair;with call bell/phone within reach;with chair alarm set;with family/visitor present   Nurse Communication Other (comment)        Time: 8948-8871 OT Time Calculation (min): 37 min  Charges: OT General Charges $OT Visit: 1 Visit OT Treatments $Self Care/Home Management : 8-22 mins $Therapeutic Activity: 8-22 mins     Delanna JINNY Lesches, OTR/L 03/03/2024, 1:37 PM

## 2024-03-03 NOTE — TOC Progression Note (Signed)
 Transition of Care [facility]) - Progression Note   Patient Details  Name: Rhonda Marshall MRN: [id number] Date of Birth: 07-22-1950  Transition of [facility] CM/SW Contact  Duwaine GORMAN Aran, LCSW Phone Number: 03/03/2024, 10:29 AM  Clinical Narrative: Care management consulted for heart failure screening. Heart failure navigation team consulted, so consult will be cleared at this time. PT/OT evaluations recommended SNF. CSW met with patient and daughter, Rhonda Marshall, to discuss recommendations. Daughter reported patient has not been ambulatory for several months, so SNF and HHPT/OT were declined. Patient has a transport chair, shower chair, BSC, walker, and grab bars at home. Daughter requested [facility] with Hedda and a hospital bed. Daughter agreeable to DME referral to [employer]. CSW made [facility] referral to Cindie with [facility], which was accepted. HHRN order in. Hospital bed narrative completed. CSW made hospital bed referral to [facility] with [facility]. [employer] to coordinate delivery with daughter. Daughter and patient updated.  Expected Discharge Plan: Home w Home Health Services Barriers to Discharge: Continued Medical Work up  Expected Discharge Plan and Services In-house Referral: Clinical Social Work Discharge Planning Services: CM Consult Post Acute Care Choice: Durable Medical Equipment, Home Health Living arrangements for the past 2 months: Single Family Home           DME Arranged: Hospital bed DME Agency: [employer] Date DME Agency Contacted: 03/03/24 Time DME Agency Contacted: 1019 Representative spoke with at DME Agency: [facility] HH Arranged: RN HH Agency: [facility] Date [facility] Agency Contacted: 03/03/24 Time HH Agency Contacted: 1005 Representative spoke with at [facility] Agency: Cindie  Social Drivers of Health (SDOH) Interventions SDOH Screenings   Food Insecurity: No Food Insecurity (03/02/2024)  Housing: Low Risk  (03/02/2024)  Transportation Needs: No Transportation Needs  (03/02/2024)  Utilities: Not At Risk (03/02/2024)  Alcohol Screen: Low Risk  (08/18/2023)  Financial Resource Strain: Low Risk  (08/18/2023)  Social Connections: Moderately Integrated (03/02/2024)  Tobacco Use: Medium Risk (02/28/2024)   Readmission Risk Interventions    03/01/2024    3:14 PM 01/01/2024    1:00 PM 04/02/2023    3:52 PM  Readmission Risk Prevention Plan  Transportation Screening Complete Complete Complete  PCP or Specialist Appt within 5-7 Days   Complete  PCP or Specialist Appt within 3-5 Days  Complete   Home Care Screening   Complete  Medication Review (RN CM)   Complete  HRI or Home Care Consult  Complete   Social Work Consult for Recovery Care Planning/Counseling  Complete   Palliative Care Screening  Not Applicable   Medication Review [employer])  Complete   PCP or Specialist appointment within 3-5 days of discharge Complete    HRI or Home Care Consult Complete    SW Recovery Care/Counseling Consult Complete    Palliative Care Screening Not Applicable    Skilled Nursing Facility Complete

## 2024-03-03 NOTE — Care Management (Signed)
 Patient has chronic systolic congestive heart failure which requires her head to be positioned in ways not feasible with a normal bed. Head must be elevated at least 30 or more or patient will have breathing difficulties due to CHF. Patient requires frequent and immediate changes in body position which cannot be achieved with a normal bed.     Durable Medical Equipment  (From admission, onward)           Start     Ordered   03/03/24 0947  For home use only DME Hospital bed  Once       Question Answer Comment  Length of Need Lifetime   Bed type Semi-electric      03/03/24 0950

## 2024-03-03 NOTE — Progress Notes (Signed)
 Progress Note  Patient Name: Rhonda Marshall Date of Encounter: 03/03/2024 [facility] Cardiologist: Lynwood Schilling, MD   Interval Summary   Shortness of breath improving. Still has orthopnea.   Vital Signs Vitals:   03/02/24 1956 03/03/24 0130 03/03/24 0410 03/03/24 0500  BP: (!) 116/93 119/71 (!) 128/53   Pulse: (!) 107 98 98   Resp: 18 16 16    Temp: 98 F (36.7 C) 98.2 F (36.8 C) 98.3 F (36.8 C)   TempSrc: Oral Oral Oral   SpO2: 99% 96% 95%   Weight:    59.9 kg  Height:        Intake/Output Summary (Last 24 hours) at 03/03/2024 0921 Last data filed at 03/03/2024 0600 Gross per 24 hour  Intake 465.71 ml  Output 3150 ml  Net -2684.29 ml      03/03/2024    5:00 AM 03/02/2024    5:00 AM 03/01/2024    4:33 AM  Last 3 Weights  Weight (lbs) 132 lb 0.9 oz 138 lb 14.2 oz 139 lb 15.9 oz  Weight (kg) 59.9 kg 63 kg 63.5 kg      Telemetry/ECG  Patient not currently on telemetry.  Placed orders for telemetry- Personally Reviewed  Physical Exam  GEN: No acute distress.   Neck: positive JVD Cardiac: irregularly irregular rhythm, no murmurs, rubs, or gallops.  Respiratory: crackles heard through out the lungs. GI: Soft, nontender, non-distended  MS: 2+ edema  Assessment & Plan  Is a 73 year old female with past medical history of permanent atrial fibrillation on Eliquis , HFrEF, CKD stage III, and hypothyroidism   Permanent atrial fibrillation CHA2DS2-VASc Score = 4 [CHF History: 1, HTN History: 0, Diabetes History: 0, Stroke History: 0, Vascular Disease History: 1, Age Score: 1, Gender Score: 1].  Therefore, the patient's annual risk of stroke is 4.8 %.   PTA was on Eliquis  and 100 mg of amiodarone  daily.  Amiodarone  was held due to concerns with the patient's thyroid .   Blood pressure is labile.  Most recent BP 128/53.  Vital signs show heart rates in the 90s to 100s. Continue Eliquis  5 mg twice daily Restart home Toprol  XL 50 mg twice daily. To help with rate  control. Ordered telemetry as is not on telemetry.   Acute HFrEF Left bundle branch block Was hospitalized for shortness of breath, bilateral lower extremity edema, and confusion, and right elbow swelling.  BMP was elevated at 3431. Echocardiogram this hospitalization showed a reduced LVEF of 30 to 35%,  mild concentric LVH, mildly reduced RV function, and mobile density.  Was reviewed by Dr. Delford and felt unlikely to be vegetation. Patient reported her baseline weight is about 100 pounds.  At office visit on 11/19/2023 patient weighed 107 pounds.  Most recent weight was 132 pounds.  This is down from 138 yesterday.  Outs over the past day were about 3 L.  Intake was 456 mL.  Doubt this is complete intake as only has 240 mL of documented oral intake in the past 24 hours.  Blood pressure is labile.  Most recent BP 128/53.  Vital signs show heart rates in the 90s to 100s. - Chest x-ray yesterday showed Bilateral pleural effusions, right greater than left with associated compressive atelectasis of the lung bases versus pneumonia. Continue Lasix  40 mg twice daily Strict I's and O's GDMT Restart home Toprol  XL 50 mg twice daily.     Hypoalbuminemia Failure to thrive Albumin  1.7.  This is likely contributing to increased edema.  mixed shock acute hypoxic respiratory failure Blood pressures have improved recent On antibiotics per primary     Otherwise manage per primary   For questions or updates, please contact [facility] Please consult [email or web address] for contact info under       Signed, Deanie Jupiter, PA-C

## 2024-03-04 DIAGNOSIS — N1831 Chronic kidney disease, stage 3a: Secondary | ICD-10-CM

## 2024-03-04 DIAGNOSIS — G9340 Encephalopathy, unspecified: Secondary | ICD-10-CM | POA: Diagnosis not present

## 2024-03-04 DIAGNOSIS — I5043 Acute on chronic combined systolic (congestive) and diastolic (congestive) heart failure: Secondary | ICD-10-CM | POA: Diagnosis not present

## 2024-03-04 DIAGNOSIS — J189 Pneumonia, unspecified organism: Secondary | ICD-10-CM | POA: Diagnosis not present

## 2024-03-04 DIAGNOSIS — J9601 Acute respiratory failure with hypoxia: Secondary | ICD-10-CM | POA: Diagnosis not present

## 2024-03-04 LAB — COMPREHENSIVE METABOLIC PANEL WITH GFR
ALT: 14 U/L (ref 0–44)
AST: 31 U/L (ref 15–41)
Albumin: 2.1 g/dL — ABNORMAL LOW (ref 3.5–5.0)
Alkaline Phosphatase: 107 U/L (ref 38–126)
Anion gap: 17 — ABNORMAL HIGH (ref 5–15)
BUN: 25 mg/dL — ABNORMAL HIGH (ref 8–23)
CO2: 31 mmol/L (ref 22–32)
Calcium: 9 mg/dL (ref 8.9–10.3)
Chloride: 78 mmol/L — ABNORMAL LOW (ref 98–111)
Creatinine, Ser: 1.63 mg/dL — ABNORMAL HIGH (ref 0.44–1.00)
GFR, Estimated: 33 mL/min — ABNORMAL LOW
Glucose, Bld: 120 mg/dL — ABNORMAL HIGH (ref 70–99)
Potassium: 3.8 mmol/L (ref 3.5–5.1)
Sodium: 126 mmol/L — ABNORMAL LOW (ref 135–145)
Total Bilirubin: 1.6 mg/dL — ABNORMAL HIGH (ref 0.0–1.2)
Total Protein: 7.2 g/dL (ref 6.5–8.1)

## 2024-03-04 LAB — CBC
HCT: 32.1 % — ABNORMAL LOW (ref 36.0–46.0)
Hemoglobin: 10.2 g/dL — ABNORMAL LOW (ref 12.0–15.0)
MCH: 27.2 pg (ref 26.0–34.0)
MCHC: 31.8 g/dL (ref 30.0–36.0)
MCV: 85.6 fL (ref 80.0–100.0)
Platelets: 124 K/uL — ABNORMAL LOW (ref 150–400)
RBC: 3.75 MIL/uL — ABNORMAL LOW (ref 3.87–5.11)
RDW: 22.3 % — ABNORMAL HIGH (ref 11.5–15.5)
WBC: 9.2 K/uL (ref 4.0–10.5)
nRBC: 0 % (ref 0.0–0.2)

## 2024-03-04 LAB — CULTURE, BLOOD (ROUTINE X 2)
Culture: NO GROWTH
Special Requests: ADEQUATE

## 2024-03-04 LAB — MAGNESIUM: Magnesium: 1.6 mg/dL — ABNORMAL LOW (ref 1.7–2.4)

## 2024-03-04 LAB — PHOSPHORUS: Phosphorus: 3.3 mg/dL (ref 2.5–4.6)

## 2024-03-04 MED ORDER — MAGNESIUM SULFATE 2 GM/50ML IV SOLN
2.0000 g | Freq: Once | INTRAVENOUS | Status: AC
  Administered 2024-03-04: 2 g via INTRAVENOUS
  Filled 2024-03-04: qty 50

## 2024-03-04 NOTE — Assessment & Plan Note (Signed)
 Persisting confusion despite improving infection Will expand encephalopathy workup

## 2024-03-04 NOTE — Progress Notes (Signed)
 PT Cancellation Note  Patient Details Name: Rhonda Marshall MRN: [id number] DOB: Aug 29, 1950   Cancelled Treatment:    Reason Eval/Treat Not Completed: Fatigue/lethargy limiting ability to participate (pt sleeping soundly, RN requested pt not be disturbed at present. Will follow.)  Sylvan Delon Copp PT 03/04/2024  Acute Rehabilitation Services  Office [phone]

## 2024-03-04 NOTE — Progress Notes (Signed)
 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    Upon evaluation in the emergency department patient was found to be in hypoxic respiratory failure with evidence of bilateral infiltrates on chest imaging.  Patient is initially managed with BiPAP therapy and the hospitalist group was then called to assess the patient for admission to the hospital.  Patient was initially mated to the stepdown unit.  PCCM was additionally consulted.    Patient's bilateral infiltrates were felt to be a combination of acute on chronic systolic and diastolic congestive heart failure as well as superimposed sepsis.   Initially there was concern for possible endocarditis and possible vegetation on the aortic valve.  Cardiology and infectious disease were consulted and felt that this was unlikely and instead felt that this was likely secondary to pneumonia and recommended a total of 7 days of antibiotics.  As patient clinically improved patient was transitioned off of BiPAP and transferred from the PCCM service to the hospital service on 8/21.     Assessment & Plan Acute on chronic combined systolic and diastolic CHF (congestive heart failure) (HCC) With patient's clinical improvement patient is been transitioned to oral Lasix  therapy Continuing strict input and output monitoring Continue to monitor renal function and electrolytes with serial chemistries Cardiology has signed off but will ensure patient has outpatient follow-up with Dr. Lavona Patient has been transitioned off of bicaVera very Severe sepsis (HCC) Pneumonia of both  lungs due to infectious organism Continuing intravenous antibiotic therapy to complete a course of 7 days of antibiotics per infectious disease recommendations Earlier in the hospitalization there was concern for infective endocarditis however upon discussions with cardiology and infectious disease this was later ruled out Acute renal failure superimposed on stage 3a chronic kidney disease (HCC) Rising creatinine in the setting of diuresis Will temporarily discontinue Lasix  for now Monitoring renal function and electrolytes with serum chemistries Strict input and output monitoring Acute hypoxemic respiratory failure (HCC) Patient has been transitioned off of BiPAP therapy Team to wean supplemental oxygen  Acute encephalopathy Persisting confusion despite improving infection Will expand encephalopathy workup H/O endocarditis Recent diagnosis of endocarditis status post 6 weeks of intravenous antibiotic therapy completed in early August. Chronic atrial fibrillation (HCC) Continuing amiodarone  metoprolol  and Eliquis  Monitoring patient on telemetry    Subjective:  Patient denies shortness of breath but is still complaining of severe generalized weakness.  Physical Exam:  Vitals:   03/04/24 0500 03/04/24 0900 03/04/24 1344 03/04/24 2040  BP:  103/61 120/79 106/72  Pulse:  96 (!) 105 (!) 110  Resp:   20 16  Temp:   98.8 F (37.1 C) 98 F (36.7 C)  TempSrc:   Oral   SpO2:   96% 90%  Weight: 57.9 kg     Height:        Constitutional: Patient is lethargic but arousable ,no associated distress.   Skin: Extensive ecchymosis noted of the left upper extremity  eyes: Pupils are equally reactive to light.  No evidence of scleral icterus or conjunctival pallor.  ENMT: Moist mucous membranes noted.  Posterior pharynx clear of any exudate or lesions.   Respiratory: clear to auscultation bilaterally, no wheezing, no  crackles. Normal respiratory effort. No accessory muscle use.   Cardiovascular: Regular rate and rhythm, no murmurs / rubs / gallops.  Significant left upper extremity edema 2+ pedal pulses. No carotid bruits.  Abdomen: Abdomen is soft and nontender.  No evidence of intra-abdominal masses.  Positive bowel sounds noted in all quadrants.   Musculoskeletal: Numerous deformities of the PIP and DIP joints of the hands Data Reviewed:  I have personally reviewed and interpreted labs, imaging.  Significant findings are   CBC: Recent Labs  Lab 02/28/24 1825 02/29/24 0419 03/01/24 0440 03/02/24 0502 03/03/24 0310 03/04/24 0355  WBC 3.8* 3.6* 3.6* 4.9 6.4 9.2  NEUTROABS 2.7  --   --   --   --   --   HGB 12.9 12.0 10.6* 10.2* 10.0* 10.2*  HCT 43.3 39.7 33.1* 33.3* 33.5* 32.1*  MCV 86.4 87.3 85.3 86.7 87.9 85.6  PLT 180 150 143* 147* 137* 124*   Basic Metabolic Panel: Recent Labs  Lab 02/28/24 1825 02/29/24 0535 03/01/24 0751 03/02/24 0502 03/03/24 0310 03/04/24 0355  NA 133* 134* 136 131* 131* 126*  K 4.3 3.8 3.6 3.6 4.1 3.8  CL 87* 90* 92* 86* 85* 78*  CO2 30 32 33* 33* 33* 31  GLUCOSE 128* 96 78 98 93 120*  BUN 30* 30* 31* 28* 25* 25*  CREATININE 1.68* 1.65* 1.72* 1.55* 1.22* 1.63*  CALCIUM 9.6 9.0 8.8* 8.6* 8.8* 9.0  MG 2.1  --   --  1.6* 1.7 1.6*  PHOS  --   --   --  3.5 3.7 3.3   GFR: Estimated Creatinine Clearance: 23.9 mL/min (A) (by C-G formula based on SCr of 1.63 mg/dL (H)). Liver Function Tests: Recent Labs  Lab 02/29/24 0535 03/01/24 0751 03/02/24 0502 03/03/24 0310 03/04/24 0355  AST 22 25 27 27 31   ALT 12 12 12 14 14   ALKPHOS 105 113 103 103 107  BILITOT 1.1 1.0 1.0 1.0 1.6*  PROT 5.4* 5.5* 5.6* 6.5 7.2  ALBUMIN  1.6* 1.7* 1.7* 1.9* 2.1*     Code Status:  DNR.  Code status decision has been confirmed with: son Family Communication: Son is at the bedside and has been updated on plan of care   Severity of Illness:  The appropriate patient status for this patient is INPATIENT. Inpatient status is judged to be  reasonable and necessary in order to provide the required intensity of service to ensure the patient's safety. The patient's presenting symptoms, physical exam findings, and initial radiographic and laboratory data in the context of their chronic comorbidities is felt to place them at high risk for further clinical deterioration. Furthermore, it is not anticipated that the patient will be medically stable for discharge from the hospital within 2 midnights of admission.   * I certify that at the point of admission it is my clinical judgment that the patient will require inpatient hospital care spanning beyond 2 midnights from the point of admission due to high intensity of service, high risk for further deterioration and high frequency of surveillance required.*  Time spent:  55 minutes  Author:  Zachary JINNY Ba MD  03/04/2024 10:16 PM

## 2024-03-04 NOTE — Assessment & Plan Note (Signed)
 Recent diagnosis of endocarditis status post 6 weeks of intravenous antibiotic therapy completed in early August.

## 2024-03-04 NOTE — Assessment & Plan Note (Signed)
 Patient has been transitioned off of BiPAP therapy Team to wean supplemental oxygen 

## 2024-03-04 NOTE — Assessment & Plan Note (Signed)
 Rising creatinine in the setting of diuresis Will temporarily discontinue Lasix  for now Monitoring renal function and electrolytes with serum chemistries Strict input and output monitoring

## 2024-03-04 NOTE — TOC Progression Note (Signed)
 Transition of Care [facility]) - Progression Note   Patient Details  Name: Brandye Inthavong MRN: [id number] Date of Birth: [birth year]  [facility] CM/SW Contact  Duwaine GORMAN Aran, LCSW Phone Number: 03/04/2024, 12:03 PM  Clinical Narrative: CSW confirmed with Mitch from [employer] the hospital bed was delivered this morning.  Expected Discharge Plan: Home w Home Health Services Barriers to Discharge: Continued Medical Work up  Expected Discharge Plan and Services In-house Referral: Clinical Social Work Discharge Planning Services: CM Consult Post Acute Care Choice: Durable Medical Equipment, Home Health Living arrangements for the past 2 months: Single Family Home           DME Arranged: Hospital bed DME Agency: [facility] Date DME Agency Contacted: 03/03/24 Time DME Agency Contacted: 1019 Representative spoke with at DME Agency: [facility] HH Arranged: RN HH Agency: [facility] Date [facility] Agency Contacted: 03/03/24 Time HH Agency Contacted: 1005 Representative spoke with at [facility] Agency: Cindie  Social Drivers of Health (SDOH) Interventions SDOH Screenings   Food Insecurity: No Food Insecurity (03/02/2024)  Housing: Low Risk  (03/02/2024)  Transportation Needs: No Transportation Needs (03/02/2024)  Utilities: Not At Risk (03/02/2024)  Alcohol Screen: Low Risk  (08/18/2023)  Financial Resource Strain: Low Risk  (08/18/2023)  Social Connections: Moderately Integrated (03/02/2024)  Tobacco Use: Medium Risk (02/28/2024)   Readmission Risk Interventions    03/01/2024    3:14 PM 01/01/2024    1:00 PM 04/02/2023    3:52 PM  Readmission Risk Prevention Plan  Transportation Screening Complete Complete Complete  PCP or Specialist Appt within 5-7 Days   Complete  PCP or Specialist Appt within 3-5 Days  Complete   Home Care Screening   Complete  Medication Review (RN CM)   Complete  HRI or Home Care Consult  Complete   Social Work Consult for Recovery Care Planning/Counseling   Complete   Palliative Care Screening  Not Applicable   Medication Review [employer])  Complete   PCP or Specialist appointment within 3-5 days of discharge Complete    HRI or Home Care Consult Complete    SW Recovery Care/Counseling Consult Complete    Palliative Care Screening Not Applicable    Skilled Nursing Facility Complete

## 2024-03-04 NOTE — Hospital Course (Signed)
 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    Upon evaluation in the emergency department patient was found to be in hypoxic respiratory failure with evidence of bilateral infiltrates on chest imaging.  Patient is initially managed with BiPAP therapy and the hospitalist group was then called to assess the patient for admission to the hospital.  Patient was initially mated to the stepdown unit.  PCCM was additionally consulted.    Patient's bilateral infiltrates were felt to be a combination of acute on chronic systolic and diastolic congestive heart failure as well as superimposed sepsis.   Initially there was concern for possible endocarditis and possible vegetation on the aortic valve.  Cardiology and infectious disease were consulted and felt that this was unlikely and instead felt that this was likely secondary to pneumonia and recommended a total of 7 days of antibiotics.  As patient clinically improved patient was transitioned off of BiPAP and transferred from the PCCM service to the hospital service on 8/21.

## 2024-03-04 NOTE — Assessment & Plan Note (Signed)
 Continuing amiodarone  metoprolol  and Eliquis  Monitoring patient on telemetry

## 2024-03-04 NOTE — Assessment & Plan Note (Signed)
 With patient's clinical improvement patient is been transitioned to oral Lasix  therapy Continuing strict input and output monitoring Continue to monitor renal function and electrolytes with serial chemistries Cardiology has signed off but will ensure patient has outpatient follow-up with Dr. Lavona Patient has been transitioned off of bicaVera very

## 2024-03-04 NOTE — Assessment & Plan Note (Signed)
 Continuing intravenous antibiotic therapy to complete a course of 7 days of antibiotics per infectious disease recommendations Earlier in the hospitalization there was concern for infective endocarditis however upon discussions with cardiology and infectious disease this was later ruled out

## 2024-03-04 NOTE — Progress Notes (Signed)
 Heart Failure Navigator Progress Note  Assessed for Heart & Vascular TOC clinic readiness.  Patient does not meet criteria due to per MD note patient experiencing end stage disease process, function has been decreased, no ambulation in over a month and spends most time in the bed. Patient has a [facility] appointment scheduled on 03/21/2024, and Per Cardiology PA note, they will have [facility] arrange a follow up appointment with Dr. Lavona. No HF TOC  Navigator will sign off at this time.   Stephane Haddock, BSN, Scientist, clinical (histocompatibility and immunogenetics) Only

## 2024-03-04 NOTE — Progress Notes (Signed)
   03/04/24 2231  BiPAP/CPAP/SIPAP  Reason BIPAP/CPAP not in use Non-compliant (PATIENT REFUSES. NOT IN ROOM)  BiPAP/CPAP /SiPAP Vitals  Resp (!) 27  MEWS Score/Color  MEWS Score 3  MEWS Score Color Yellow

## 2024-03-05 ENCOUNTER — Inpatient Hospital Stay (HOSPITAL_COMMUNITY)

## 2024-03-05 DIAGNOSIS — I5043 Acute on chronic combined systolic (congestive) and diastolic (congestive) heart failure: Secondary | ICD-10-CM | POA: Diagnosis not present

## 2024-03-05 DIAGNOSIS — J189 Pneumonia, unspecified organism: Secondary | ICD-10-CM | POA: Diagnosis not present

## 2024-03-05 DIAGNOSIS — J9601 Acute respiratory failure with hypoxia: Secondary | ICD-10-CM | POA: Diagnosis not present

## 2024-03-05 DIAGNOSIS — G9340 Encephalopathy, unspecified: Secondary | ICD-10-CM | POA: Diagnosis not present

## 2024-03-05 LAB — CBC WITH DIFFERENTIAL/PLATELET
Abs Immature Granulocytes: 0.08 K/uL — ABNORMAL HIGH (ref 0.00–0.07)
Band Neutrophils: 0 %
Basophils Absolute: 0 K/uL (ref 0.0–0.1)
Basophils Relative: 0 %
Blasts: 0 %
Eosinophils Absolute: 0 K/uL (ref 0.0–0.5)
Eosinophils Relative: 0 %
HCT: 31.5 % — ABNORMAL LOW (ref 36.0–46.0)
Hemoglobin: 9.7 g/dL — ABNORMAL LOW (ref 12.0–15.0)
Immature Granulocytes: 1 %
Lymphocytes Relative: 8 %
Lymphs Abs: 0.6 K/uL — ABNORMAL LOW (ref 0.7–4.0)
MCH: 26.6 pg (ref 26.0–34.0)
MCHC: 30.8 g/dL (ref 30.0–36.0)
MCV: 86.3 fL (ref 80.0–100.0)
Metamyelocytes Relative: 0 %
Monocytes Absolute: 0.4 K/uL (ref 0.1–1.0)
Monocytes Relative: 5 %
Myelocytes: 0 %
Neutro Abs: 6.5 K/uL (ref 1.7–7.7)
Neutrophils Relative %: 86 %
Other: 0 %
Platelets: 100 K/uL — ABNORMAL LOW (ref 150–400)
Promyelocytes Relative: 0 %
RBC: 3.65 MIL/uL — ABNORMAL LOW (ref 3.87–5.11)
RDW: 22.2 % — ABNORMAL HIGH (ref 11.5–15.5)
WBC: 7.6 K/uL (ref 4.0–10.5)
nRBC: 0 % (ref 0.0–0.2)
nRBC: 0 /100{WBCs}

## 2024-03-05 LAB — COMPREHENSIVE METABOLIC PANEL WITH GFR
ALT: 12 U/L (ref 0–44)
AST: 28 U/L (ref 15–41)
Albumin: 1.9 g/dL — ABNORMAL LOW (ref 3.5–5.0)
Alkaline Phosphatase: 101 U/L (ref 38–126)
Anion gap: 15 (ref 5–15)
BUN: 29 mg/dL — ABNORMAL HIGH (ref 8–23)
CO2: 33 mmol/L — ABNORMAL HIGH (ref 22–32)
Calcium: 8.7 mg/dL — ABNORMAL LOW (ref 8.9–10.3)
Chloride: 80 mmol/L — ABNORMAL LOW (ref 98–111)
Creatinine, Ser: 1.64 mg/dL — ABNORMAL HIGH (ref 0.44–1.00)
GFR, Estimated: 33 mL/min — ABNORMAL LOW (ref >60)
Glucose, Bld: 101 mg/dL — ABNORMAL HIGH (ref 70–99)
Potassium: 3.5 mmol/L (ref 3.5–5.1)
Sodium: 128 mmol/L — ABNORMAL LOW (ref 135–145)
Total Bilirubin: 1.7 mg/dL — ABNORMAL HIGH (ref 0.0–1.2)
Total Protein: 6.4 g/dL — ABNORMAL LOW (ref 6.5–8.1)

## 2024-03-05 LAB — CULTURE, BLOOD (ROUTINE X 2)
Culture: NO GROWTH
Special Requests: ADEQUATE

## 2024-03-05 LAB — MAGNESIUM: Magnesium: 2.2 mg/dL (ref 1.7–2.4)

## 2024-03-05 LAB — VITAMIN B12: Vitamin B-12: 588 pg/mL (ref 180–914)

## 2024-03-05 LAB — FOLATE: Folate: 14.3 ng/mL (ref >5.9)

## 2024-03-05 LAB — BRAIN NATRIURETIC PEPTIDE: B Natriuretic Peptide: 4480.7 pg/mL — ABNORMAL HIGH (ref 0.0–100.0)

## 2024-03-05 LAB — PROCALCITONIN: Procalcitonin: 0.37 ng/mL

## 2024-03-05 MED ORDER — FUROSEMIDE 10 MG/ML IJ SOLN
40.0000 mg | Freq: Two times a day (BID) | INTRAMUSCULAR | Status: DC
  Administered 2024-03-05 – 2024-03-06 (×2): 40 mg via INTRAVENOUS
  Filled 2024-03-05 (×2): qty 4

## 2024-03-05 NOTE — Assessment & Plan Note (Signed)
 Continuing intravenous antibiotic therapy to complete a course of 7 days of antibiotics per infectious disease recommendations Currently on day 6 of 7 of Zosyn  Earlier in the hospitalization there was concern for infective endocarditis however upon discussions with cardiology and infectious disease this was later ruled out

## 2024-03-05 NOTE — Assessment & Plan Note (Signed)
 Unclear as to whether patient suffered from acute kidney injury (baseline Cr in 12/2023 1.1) or simply progression of chronic kidney disease in the setting of advanced cardiomyopathy Monitoring renal function and electrolytes with serum chemistries Strict input and output monitoring

## 2024-03-05 NOTE — Assessment & Plan Note (Signed)
 Continuing amiodarone  metoprolol  and Eliquis  Monitoring patient on telemetry

## 2024-03-05 NOTE — Assessment & Plan Note (Signed)
 Patient continuing to exhibit substantial oxygen  requirement, presumably secondary to persisting bilateral pleural effusions  Patient has been transitioned off of BiPAP therapy Will continue to try to wean supplemental oxygen  as patient continues to be diuresed as tolerated

## 2024-03-05 NOTE — Progress Notes (Signed)
 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    Upon evaluation in the emergency department patient was found to be in hypoxic respiratory failure with evidence of bilateral infiltrates on chest imaging.  Patient is initially managed with BiPAP therapy and the hospitalist group was then called to assess the patient for admission to the hospital.  Patient was initially mated to the stepdown unit.  PCCM was additionally consulted.    Patient's bilateral infiltrates were felt to be a combination of acute on chronic systolic and diastolic congestive heart failure as well as superimposed sepsis.   Initially there was concern for possible endocarditis and possible vegetation on the aortic valve.  Cardiology and infectious disease were consulted and felt that this was unlikely and instead felt that this was likely secondary to pneumonia and recommended a total of 7 days of antibiotics.  As patient clinically improved patient was transitioned off of BiPAP and transferred from the PCCM service to the hospital service on 8/21.     Assessment & Plan Acute on chronic combined systolic and diastolic CHF (congestive heart failure) (HCC) Patient exhibiting persistent oxygen  requirement with evidence of persistent bilateral pleural effusions on chest x-ray and markedly elevated BNP Will attempt to resume intravenous Lasix  this evening Continuing strict input and output monitoring Continue to monitor renal function and electrolytes with serial chemistries Cardiology has signed off but will ensure patient has outpatient follow-up  with Dr. Lavona Severe sepsis [facility]) Pneumonia of both lungs due to infectious organism Continuing intravenous antibiotic therapy to complete a course of 7 days of antibiotics per infectious disease recommendations Currently on day 6 of 7 of Zosyn  Earlier in the hospitalization there was concern for infective endocarditis however upon discussions with cardiology and infectious disease this was later ruled out Acute renal failure superimposed on stage 3a chronic kidney disease (HCC) Unclear as to whether patient suffered from acute kidney injury (baseline Cr in 12/2023 1.1) or simply progression of chronic kidney disease in the setting of advanced cardiomyopathy Monitoring renal function and electrolytes with serum chemistries Strict input and output monitoring Acute hypoxemic respiratory failure (HCC) Bilateral pleural effusion Patient continuing to exhibit substantial oxygen  requirement, presumably secondary to persisting bilateral pleural effusions  Patient has been transitioned off of BiPAP therapy Will continue to try to wean supplemental oxygen  as patient continues to be diuresed as tolerated Acute encephalopathy Confusion slowly improving Conservative management Vitamin B12, VBG, folate unremarkable TSH slightly elevated at 11.6 on 8/18 however this is likely euthyroid sick H/O endocarditis Recent diagnosis of endocarditis status post 6 weeks of intravenous antibiotic therapy completed in early August. Chronic atrial fibrillation (HCC) Continuing amiodarone  metoprolol  and Eliquis  Monitoring patient on telemetry    Subjective:  Patient denies shortness of breath.  Patient states that her strength is improving.  Physical Exam:  Vitals:   03/05/24 1002 03/05/24 1232 03/05/24 1542 03/05/24 2112  BP: 94/68 107/72 116/77 (!) 151/113  Pulse:  (!) 102  (!) 102  Resp:  17  20  Temp:  98.7 F (37.1 C)  98.3 F (36.8 C)  TempSrc:      SpO2:  99%  (!) 88%  Weight:  Height:         Constitutional: Patient is lethargic but arousable and oriented x 3 improving mentation compared to yesterday.,no associated distress.   Skin: Extensive ecchymosis noted of the left upper extremity  eyes: Pupils are equally reactive to light.  No evidence of scleral icterus or conjunctival pallor.  ENMT: Moist mucous membranes noted.  Posterior pharynx clear of any exudate or lesions.   Respiratory: Significant bibasilar rales.  No evidence of wheezing.  Normal respiratory effort. No accessory muscle use.  Cardiovascular: Regular rate and rhythm, no murmurs / rubs / gallops.  Significant left upper extremity edema 2+ pedal pulses. No carotid bruits.  Abdomen: Abdomen is soft and nontender.  No evidence of intra-abdominal masses.  Positive bowel sounds noted in all quadrants.   Musculoskeletal: Numerous deformities of the PIP and DIP joints of the hands Data Reviewed:  I have personally reviewed and interpreted labs, imaging.  Significant findings are   CBC: Recent Labs  Lab 02/28/24 1825 02/29/24 0419 03/01/24 0440 03/02/24 0502 03/03/24 0310 03/04/24 0355 03/05/24 0413  WBC 3.8*   < > 3.6* 4.9 6.4 9.2 7.6  NEUTROABS 2.7  --   --   --   --   --  6.5  HGB 12.9   < > 10.6* 10.2* 10.0* 10.2* 9.7*  HCT 43.3   < > 33.1* 33.3* 33.5* 32.1* 31.5*  MCV 86.4   < > 85.3 86.7 87.9 85.6 86.3  PLT 180   < > 143* 147* 137* 124* 100*   < > = values in this interval not displayed.   Basic Metabolic Panel: Recent Labs  Lab 02/28/24 1825 02/29/24 0535 03/01/24 0751 03/02/24 0502 03/03/24 0310 03/04/24 0355 03/05/24 0413  NA 133*   < > 136 131* 131* 126* 128*  K 4.3   < > 3.6 3.6 4.1 3.8 3.5  CL 87*   < > 92* 86* 85* 78* 80*  CO2 30   < > 33* 33* 33* 31 33*  GLUCOSE 128*   < > 78 98 93 120* 101*  BUN 30*   < > 31* 28* 25* 25* 29*  CREATININE 1.68*   < > 1.72* 1.55* 1.22* 1.63* 1.64*  CALCIUM 9.6   < > 8.8* 8.6* 8.8* 9.0 8.7*  MG 2.1  --   --  1.6* 1.7 1.6* 2.2  PHOS  --   --    --  3.5 3.7 3.3  --    < > = values in this interval not displayed.   GFR: Estimated Creatinine Clearance: 23.8 mL/min (A) (by C-G formula based on SCr of 1.64 mg/dL (H)). Liver Function Tests: Recent Labs  Lab 03/01/24 0751 03/02/24 0502 03/03/24 0310 03/04/24 0355 03/05/24 0413  AST 25 27 27 31 28   ALT 12 12 14 14 12   ALKPHOS 113 103 103 107 101  BILITOT 1.0 1.0 1.0 1.6* 1.7*  PROT 5.5* 5.6* 6.5 7.2 6.4*  ALBUMIN  1.7* 1.7* 1.9* 2.1* 1.9*     Code Status:  DNR.  Code status decision has been confirmed with: son Family Communication: Son is at the bedside and has been updated on plan of care   Severity of Illness:  The appropriate patient status for this patient is INPATIENT. Inpatient status is judged to be reasonable and necessary in order to provide the required intensity of service to ensure the patient's safety. The patient's presenting symptoms, physical exam findings, and initial radiographic and laboratory data in the context of their  chronic comorbidities is felt to place them at high risk for further clinical deterioration. Furthermore, it is not anticipated that the patient will be medically stable for discharge from the hospital within 2 midnights of admission.   * I certify that at the point of admission it is my clinical judgment that the patient will require inpatient hospital care spanning beyond 2 midnights from the point of admission due to high intensity of service, high risk for further deterioration and high frequency of surveillance required.*  Time spent:  50 minutes  Author:  Zachary JINNY Ba MD  03/05/2024 10:26 PM

## 2024-03-05 NOTE — Progress Notes (Signed)
 Physical Therapy Treatment Patient Details Name: Rhonda Marshall MRN: [id number] DOB: Nov 27, 1950 Today's Date: 03/05/2024   History of Present Illness 73 yo female admitted 02/28/24 with SOB, hypoxia, bilateral lower extremity edema, and confusion, and right elbow swelling, acute encephalopaty, septic shock.SABRA  BMP was elevated at 3431, EF 30-35%. PMH: AFIB, RA, anxiety, hop fracture, right elb fracture.    PT Comments  Pt ambulated 20' with RW with min assist for balance, SpO2 97% on room air at rest, 77% on room air with ambulation. At present pt needs supplemental O2 with activity. Pt performed BLE exercises with ongoing verbal and manual cues for technique. Patient will benefit from continued inpatient follow up therapy, <3 hours/day.    If plan is discharge home, recommend the following: A lot of help with walking and/or transfers;A lot of help with bathing/dressing/bathroom;Assistance with cooking/housework;Assist for transportation;Direct supervision/assist for medications management;Direct supervision/assist for financial management;Help with stairs or ramp for entrance   Can travel by private vehicle     No  Equipment Recommendations  None recommended by PT    Recommendations for Other Services       Precautions / Restrictions Precautions Precautions: Fall Recall of Precautions/Restrictions: Impaired Precaution/Restrictions Comments: Monitor O2 Restrictions Weight Bearing Restrictions Per Provider Order: No     Mobility  Bed Mobility               General bed mobility comments: up in recliner    Transfers Overall transfer level: Needs assistance Equipment used: Rolling walker (2 wheels) Transfers: Sit to/from Stand Sit to Stand: Mod assist           General transfer comment: mod A to power up, VCs hand placement    Ambulation/Gait Ambulation/Gait assistance: Min assist Gait Distance (Feet): 20 Feet Assistive device: Rolling walker (2 wheels) Gait  Pattern/deviations: Step-through pattern, Decreased stride length Gait velocity: decr     General Gait Details: distance limited by fatigue, HR 113 walking, SpO2 77% on room air walking, SpO2 97% on room air at rest prior to activity   Stairs             Wheelchair Mobility     Tilt Bed    Modified Rankin (Stroke Patients Only)       Balance Overall balance assessment: Needs assistance, History of Falls Sitting-balance support: No upper extremity supported, Feet supported Sitting balance-Leahy Scale: Fair     Standing balance support: During functional activity, Bilateral upper extremity supported, Reliant on assistive device for balance Standing balance-Leahy Scale: Poor                              Communication Communication Communication: Impaired Factors Affecting Communication: Hearing impaired  Cognition Arousal: Alert Behavior During Therapy: WFL for tasks assessed/performed   PT - Cognitive impairments: Difficult to assess, No family/caregiver present to determine baseline, Attention                       PT - Cognition Comments: pt talks constantly from one topic to another, frequent redirections to stay on task Following commands: Impaired Following commands impaired: Follows one step commands inconsistently    Cueing Cueing Techniques: Verbal cues, Gestural cues, Tactile cues  Exercises General Exercises - Lower Extremity Ankle Circles/Pumps: AROM, Both, 10 reps, Supine Long Arc Quad: AROM, 10 reps, Seated, Both    General Comments        Pertinent Vitals/Pain Pain Assessment Pain  Score: 0-No pain    Home Living                          Prior Function            PT Goals (current goals can now be found in the care plan section) Acute Rehab PT Goals Patient Stated Goal: none stated PT Goal Formulation: With patient Time For Goal Achievement: 03/16/24 Potential to Achieve Goals: Good Progress towards  PT goals: Progressing toward goals    Frequency    Min 2X/week      PT Plan      Co-evaluation              AM-PAC PT 6 Clicks Mobility   Outcome Measure  Help needed turning from your back to your side while in a flat bed without using bedrails?: A Lot Help needed moving from lying on your back to sitting on the side of a flat bed without using bedrails?: A Lot Help needed moving to and from a bed to a chair (including a wheelchair)?: A Lot Help needed standing up from a chair using your arms (e.g., wheelchair or bedside chair)?: A Lot Help needed to walk in hospital room?: A Little Help needed climbing 3-5 steps with a railing? : Total 6 Click Score: 12    End of Session Equipment Utilized During Treatment: Gait belt Activity Tolerance: Patient limited by fatigue Patient left: in chair;with call bell/phone within reach;with chair alarm set Nurse Communication: Mobility status PT Visit Diagnosis: Unsteadiness on feet (R26.81)     Time: 8677-8661 PT Time Calculation (min) (ACUTE ONLY): 16 min  Charges:    $Gait Training: 8-22 mins PT General Charges $$ ACUTE PT VISIT: 1 Visit                     Arman Delon Copp PT 03/05/2024  Acute Rehabilitation Services  Office [phone]

## 2024-03-05 NOTE — Plan of Care (Signed)
  Problem: Clinical Measurements: Goal: Diagnostic test results will improve Outcome: Progressing Goal: Respiratory complications will improve Outcome: Progressing Goal: Cardiovascular complication will be avoided Outcome: Progressing   Problem: Activity: Goal: Risk for activity intolerance will decrease Outcome: Progressing   Problem: Nutrition: Goal: Adequate nutrition will be maintained Outcome: Progressing   Problem: Coping: Goal: Level of anxiety will decrease Outcome: Progressing   Problem: Pain Managment: Goal: General experience of comfort will improve and/or be controlled Outcome: Progressing

## 2024-03-05 NOTE — Assessment & Plan Note (Signed)
 Patient exhibiting persistent oxygen  requirement with evidence of persistent bilateral pleural effusions on chest x-ray and markedly elevated BNP Will attempt to resume intravenous Lasix  this evening Continuing strict input and output monitoring Continue to monitor renal function and electrolytes with serial chemistries Cardiology has signed off but will ensure patient has outpatient follow-up with Dr. Lavona

## 2024-03-05 NOTE — Assessment & Plan Note (Signed)
 Confusion slowly improving Conservative management Vitamin B12, VBG, folate unremarkable TSH slightly elevated at 11.6 on 8/18 however this is likely euthyroid sick

## 2024-03-05 NOTE — Assessment & Plan Note (Signed)
 Recent diagnosis of endocarditis status post 6 weeks of intravenous antibiotic therapy completed in early August.

## 2024-03-06 ENCOUNTER — Inpatient Hospital Stay (HOSPITAL_COMMUNITY)

## 2024-03-06 DIAGNOSIS — J9601 Acute respiratory failure with hypoxia: Secondary | ICD-10-CM | POA: Diagnosis not present

## 2024-03-06 DIAGNOSIS — I5043 Acute on chronic combined systolic (congestive) and diastolic (congestive) heart failure: Secondary | ICD-10-CM | POA: Diagnosis not present

## 2024-03-06 DIAGNOSIS — J189 Pneumonia, unspecified organism: Secondary | ICD-10-CM | POA: Diagnosis not present

## 2024-03-06 DIAGNOSIS — M7989 Other specified soft tissue disorders: Secondary | ICD-10-CM | POA: Insufficient documentation

## 2024-03-06 DIAGNOSIS — G9340 Encephalopathy, unspecified: Secondary | ICD-10-CM | POA: Diagnosis not present

## 2024-03-06 LAB — BASIC METABOLIC PANEL WITH GFR
Anion gap: 14 (ref 5–15)
BUN: 33 mg/dL — ABNORMAL HIGH (ref 8–23)
CO2: 32 mmol/L (ref 22–32)
Calcium: 8.6 mg/dL — ABNORMAL LOW (ref 8.9–10.3)
Chloride: 83 mmol/L — ABNORMAL LOW (ref 98–111)
Creatinine, Ser: 1.6 mg/dL — ABNORMAL HIGH (ref 0.44–1.00)
GFR, Estimated: 34 mL/min — ABNORMAL LOW (ref >60)
Glucose, Bld: 114 mg/dL — ABNORMAL HIGH (ref 70–99)
Potassium: 3.1 mmol/L — ABNORMAL LOW (ref 3.5–5.1)
Sodium: 129 mmol/L — ABNORMAL LOW (ref 135–145)

## 2024-03-06 LAB — CBC WITH DIFFERENTIAL/PLATELET
Abs Granulocyte: 5.9 K/uL (ref 1.5–6.5)
Abs Immature Granulocytes: 0.02 K/uL (ref 0.00–0.07)
Basophils Absolute: 0 K/uL (ref 0.0–0.1)
Basophils Relative: 0 %
Eosinophils Absolute: 0.1 K/uL (ref 0.0–0.5)
Eosinophils Relative: 1 %
HCT: 27.7 % — ABNORMAL LOW (ref 36.0–46.0)
Hemoglobin: 8.7 g/dL — ABNORMAL LOW (ref 12.0–15.0)
Immature Granulocytes: 0 %
Lymphocytes Relative: 7 %
Lymphs Abs: 0.5 K/uL — ABNORMAL LOW (ref 0.7–4.0)
MCH: 27.1 pg (ref 26.0–34.0)
MCHC: 31.4 g/dL (ref 30.0–36.0)
MCV: 86.3 fL (ref 80.0–100.0)
Monocytes Absolute: 0.6 K/uL (ref 0.1–1.0)
Monocytes Relative: 8 %
Neutro Abs: 5.9 K/uL (ref 1.7–7.7)
Neutrophils Relative %: 84 %
Platelets: 100 K/uL — ABNORMAL LOW (ref 150–400)
RBC: 3.21 MIL/uL — ABNORMAL LOW (ref 3.87–5.11)
RDW: 21.9 % — ABNORMAL HIGH (ref 11.5–15.5)
WBC: 7.1 K/uL (ref 4.0–10.5)
nRBC: 0 % (ref 0.0–0.2)

## 2024-03-06 LAB — MAGNESIUM: Magnesium: 2.1 mg/dL (ref 1.7–2.4)

## 2024-03-06 MED ORDER — OXYCODONE HCL 5 MG PO TABS
10.0000 mg | ORAL_TABLET | Freq: Four times a day (QID) | ORAL | Status: AC | PRN
  Administered 2024-03-06 (×2): 10 mg via ORAL
  Filled 2024-03-06 (×2): qty 2

## 2024-03-06 MED ORDER — POTASSIUM CHLORIDE CRYS ER 20 MEQ PO TBCR
40.0000 meq | EXTENDED_RELEASE_TABLET | Freq: Once | ORAL | Status: AC
  Administered 2024-03-06: 40 meq via ORAL
  Filled 2024-03-06: qty 2

## 2024-03-06 MED ORDER — OXYCODONE HCL 5 MG PO TABS
10.0000 mg | ORAL_TABLET | Freq: Four times a day (QID) | ORAL | Status: DC | PRN
  Administered 2024-03-06 – 2024-03-08 (×4): 10 mg via ORAL
  Filled 2024-03-06 (×4): qty 2

## 2024-03-06 MED ORDER — FUROSEMIDE 10 MG/ML IJ SOLN
60.0000 mg | Freq: Two times a day (BID) | INTRAMUSCULAR | Status: DC
  Administered 2024-03-06 – 2024-03-07 (×3): 60 mg via INTRAVENOUS
  Filled 2024-03-06 (×4): qty 6

## 2024-03-06 NOTE — Progress Notes (Signed)
 LUE venous exam is completed.  Positive possible pseudo report given to nurse. Koal Eslinger, RVT

## 2024-03-06 NOTE — Assessment & Plan Note (Signed)
 Continuing amiodarone  metoprolol  and Eliquis  Monitoring patient on telemetry

## 2024-03-06 NOTE — Assessment & Plan Note (Addendum)
 Patient continuing to exhibit substantial oxygen  requirement, presumably secondary to persisting bilateral pleural effusions  Patient has been transitioned off of BiPAP therapy Will continue to try to wean supplemental oxygen  as patient continues to be diuresed as kidney function tolerates

## 2024-03-06 NOTE — Plan of Care (Signed)
  Problem: Nutrition: Goal: Adequate nutrition will be maintained Outcome: Progressing   Problem: Coping: Goal: Level of anxiety will decrease Outcome: Progressing   Problem: Pain Managment: Goal: General experience of comfort will improve and/or be controlled Outcome: Progressing

## 2024-03-06 NOTE — Assessment & Plan Note (Addendum)
 Continuing intravenous antibiotic therapy to complete a course of 7 days of antibiotics per infectious disease recommendations Currently on day 6 of 7 of Zosyn  (to complete 8/25) Earlier in the hospitalization there was concern for infective endocarditis however upon discussions with cardiology and infectious disease this was later ruled out

## 2024-03-06 NOTE — Progress Notes (Signed)
 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    Upon evaluation in the emergency department patient was found to be in hypoxic respiratory failure with evidence of bilateral infiltrates on chest imaging.  Patient is initially managed with BiPAP therapy and the hospitalist group was then called to assess the patient for admission to the hospital.  Patient was initially mated to the stepdown unit.  PCCM was additionally consulted.    Patient's bilateral infiltrates were felt to be a combination of acute on chronic systolic and diastolic congestive heart failure as well as superimposed sepsis.   Initially there was concern for possible endocarditis and possible vegetation on the aortic valve.  Cardiology and infectious disease were consulted and felt that this was unlikely and instead felt that this was likely secondary to pneumonia and recommended a total of 7 days of antibiotics.  As patient clinically improved patient was transitioned off of BiPAP and transferred from the PCCM service to the hospital service on 8/21.     Assessment & Plan Acute on chronic combined systolic and diastolic CHF (congestive heart failure) (HCC) Patient exhibiting persistent oxygen  requirement with evidence of persistent bilateral pleural effusions on chest x-ray and markedly elevated BNP Increasing Lasix  to 60 mg IV twice daily. Continuing strict input and output monitoring Continue to monitor renal function and electrolytes with serial chemistries Cardiology has signed off but will ensure patient has outpatient follow-up with Dr.  Lavona Severe sepsis [facility]) Pneumonia of both lungs due to infectious organism Continuing intravenous antibiotic therapy to complete a course of 7 days of antibiotics per infectious disease recommendations Currently on day 6 of 7 of Zosyn  (to complete 8/25) Earlier in the hospitalization there was concern for infective endocarditis however upon discussions with cardiology and infectious disease this was later ruled out Acute renal failure superimposed on stage 3a chronic kidney disease (HCC) Unclear as to whether patient suffered from acute kidney injury this hospitalization (baseline Cr in 12/2023 1.1) or simply progression of chronic kidney disease in the setting of advanced cardiomyopathy Other than an aberrant creatinine on 8/21, most creatinines have been been unchanged Monitoring renal function and electrolytes with serum chemistries Strict input and output monitoring Acute hypoxemic respiratory failure (HCC) Bilateral pleural effusion Patient continuing to exhibit substantial oxygen  requirement, presumably secondary to persisting bilateral pleural effusions  Patient has been transitioned off of BiPAP therapy Will continue to try to wean supplemental oxygen  as patient continues to be diuresed as kidney function tolerates  Left upper extremity swelling Extensive swelling and ecchymosis of the left upper extremity after removal of arterial line earlier in the hospitalization Left upper extremity ultrasound obtained revealing no evidence of DVT but possible pseudoaneurysm Discussed with Dr. Serene with vascular surgery who will come see patient in consultation.  His input is appreciated. Acute encephalopathy Confusion slowly improving mentation nearing baseline. Conservative management, likely related to critical illness. Vitamin B12, VBG, folate unremarkable TSH slightly elevated at 11.6 on 8/18 however this is likely euthyroid sick H/O endocarditis Recent diagnosis of endocarditis  status post 6 weeks of intravenous antibiotic therapy completed in early August. Chronic atrial fibrillation (HCC) Continuing amiodarone  metoprolol  and Eliquis  Monitoring patient  on telemetry    Subjective:  Patient denies shortness of breath.  Patient states that her strength is improving.  Physical Exam:  Vitals:   03/05/24 1232 03/05/24 1542 03/05/24 2112 03/06/24 0534  BP: 107/72 116/77 (!) 151/113 90/69  Pulse: (!) 102  (!) 102 96  Resp: 17  20 18   Temp: 98.7 F (37.1 C)  98.3 F (36.8 C) 97.7 F (36.5 C)  TempSrc:    Oral  SpO2: 99%  (!) 88% 97%  Weight:      Height:        Constitutional: Patient is lethargic but arousable and oriented x 3 improving mentation compared to yesterday.,no associated distress.   Skin: Extensive ecchymosis noted of the left upper extremity extending to the hand. Eyes: Pupils are equally reactive to light.  No evidence of scleral icterus or conjunctival pallor.  ENMT: Moist mucous membranes noted.  Posterior pharynx clear of any exudate or lesions.   Respiratory: Significant bibasilar rales.  No evidence of wheezing.  Normal respiratory effort. No accessory muscle use.  Cardiovascular: Regular rate and rhythm, no murmurs / rubs / gallops.  Significant left upper extremity edema 2+ pedal pulses. No carotid bruits.  Abdomen: Abdomen is soft and nontender.  No evidence of intra-abdominal masses.  Positive bowel sounds noted in all quadrants.   Musculoskeletal: Numerous deformities of the PIP and DIP joints of the hands.  Tenderness throughout the left upper extremity.  Data Reviewed:  I have personally reviewed and interpreted labs, imaging.  Significant findings are   CBC: Recent Labs  Lab 02/28/24 1825 02/29/24 0419 03/02/24 0502 03/03/24 0310 03/04/24 0355 03/05/24 0413 03/06/24 0314  WBC 3.8*   < > 4.9 6.4 9.2 7.6 7.1  NEUTROABS 2.7  --   --   --   --  6.5 5.9  HGB 12.9   < > 10.2* 10.0* 10.2* 9.7* 8.7*  HCT 43.3   < > 33.3*  33.5* 32.1* 31.5* 27.7*  MCV 86.4   < > 86.7 87.9 85.6 86.3 86.3  PLT 180   < > 147* 137* 124* 100* 100*   < > = values in this interval not displayed.   Basic Metabolic Panel: Recent Labs  Lab 03/01/24 0751 03/02/24 0502 03/03/24 0310 03/04/24 0355 03/05/24 0413 03/06/24 0314  NA 136 131* 131* 126* 128*  --   K 3.6 3.6 4.1 3.8 3.5  --   CL 92* 86* 85* 78* 80*  --   CO2 33* 33* 33* 31 33*  --   GLUCOSE 78 98 93 120* 101*  --   BUN 31* 28* 25* 25* 29*  --   CREATININE 1.72* 1.55* 1.22* 1.63* 1.64*  --   CALCIUM 8.8* 8.6* 8.8* 9.0 8.7*  --   MG  --  1.6* 1.7 1.6* 2.2 2.1  PHOS  --  3.5 3.7 3.3  --   --    GFR: Estimated Creatinine Clearance: 23.8 mL/min (A) (by C-G formula based on SCr of 1.64 mg/dL (H)). Liver Function Tests: Recent Labs  Lab 03/01/24 0751 03/02/24 0502 03/03/24 0310 03/04/24 0355 03/05/24 0413  AST 25 27 27 31 28   ALT 12 12 14 14 12   ALKPHOS 113 103 103 107 101  BILITOT 1.0 1.0 1.0 1.6* 1.7*  PROT 5.5* 5.6* 6.5 7.2 6.4*  ALBUMIN  1.7* 1.7* 1.9* 2.1* 1.9*     Code Status:  DNR.  Code status decision has been confirmed with: son Family Communication: Son is at the bedside  and has been updated on plan of care   Severity of Illness:  The appropriate patient status for this patient is INPATIENT. Inpatient status is judged to be reasonable and necessary in order to provide the required intensity of service to ensure the patient's safety. The patient's presenting symptoms, physical exam findings, and initial radiographic and laboratory data in the context of their chronic comorbidities is felt to place them at high risk for further clinical deterioration. Furthermore, it is not anticipated that the patient will be medically stable for discharge from the hospital within 2 midnights of admission.   * I certify that at the point of admission it is my clinical judgment that the patient will require inpatient hospital care spanning beyond 2 midnights from the  point of admission due to high intensity of service, high risk for further deterioration and high frequency of surveillance required.*  Time spent:  54 minutes  Author:  Zachary JINNY Ba MD  03/06/2024 8:12 AM

## 2024-03-06 NOTE — Assessment & Plan Note (Signed)
 Extensive swelling and ecchymosis of the left upper extremity after removal of arterial line earlier in the hospitalization Left upper extremity ultrasound obtained revealing no evidence of DVT but possible pseudoaneurysm Discussed with Dr. Serene with vascular surgery who will come see patient in consultation.  His input is appreciated.

## 2024-03-06 NOTE — Progress Notes (Signed)
 U/s reviewed, this looks for like a hematoma or thrombosed pseudoaneurysm with flow only in the neck.  I would recommend outpatient follow up in 1 week with repeat ultrasound   Rhonda Marshall

## 2024-03-06 NOTE — Assessment & Plan Note (Addendum)
 Patient exhibiting persistent oxygen  requirement with evidence of persistent bilateral pleural effusions on chest x-ray and markedly elevated BNP Increasing Lasix  to 60 mg IV twice daily. Continuing strict input and output monitoring Continue to monitor renal function and electrolytes with serial chemistries Cardiology has signed off but will ensure patient has outpatient follow-up with Dr. Lavona

## 2024-03-06 NOTE — Assessment & Plan Note (Addendum)
 Confusion slowly improving mentation nearing baseline. Conservative management, likely related to critical illness. Vitamin B12, VBG, folate unremarkable TSH slightly elevated at 11.6 on 8/18 however this is likely euthyroid sick

## 2024-03-06 NOTE — Assessment & Plan Note (Signed)
 Recent diagnosis of endocarditis status post 6 weeks of intravenous antibiotic therapy completed in early August.

## 2024-03-06 NOTE — Assessment & Plan Note (Addendum)
 Unclear as to whether patient suffered from acute kidney injury this hospitalization (baseline Cr in 12/2023 1.1) or simply progression of chronic kidney disease in the setting of advanced cardiomyopathy Other than an aberrant creatinine on 8/21, most creatinines have been been unchanged Monitoring renal function and electrolytes with serum chemistries Strict input and output monitoring

## 2024-03-07 DIAGNOSIS — I5043 Acute on chronic combined systolic (congestive) and diastolic (congestive) heart failure: Secondary | ICD-10-CM | POA: Diagnosis not present

## 2024-03-07 MED ORDER — POTASSIUM CHLORIDE CRYS ER 20 MEQ PO TBCR
40.0000 meq | EXTENDED_RELEASE_TABLET | Freq: Three times a day (TID) | ORAL | Status: DC
  Administered 2024-03-07 (×3): 40 meq via ORAL
  Filled 2024-03-07 (×4): qty 2

## 2024-03-07 MED ORDER — POLYETHYLENE GLYCOL 3350 17 G PO PACK
17.0000 g | PACK | Freq: Every day | ORAL | Status: DC
  Administered 2024-03-07 – 2024-03-08 (×2): 17 g via ORAL
  Filled 2024-03-07 (×2): qty 1

## 2024-03-07 NOTE — Plan of Care (Signed)
  Problem: Education: Goal: Knowledge of General Education information will improve Description: Including pain rating scale, medication(s)/side effects and non-pharmacologic comfort measures Outcome: Progressing   Problem: Clinical Measurements: Goal: Respiratory complications will improve Outcome: Progressing   Problem: Nutrition: Goal: Adequate nutrition will be maintained Outcome: Progressing   Problem: Coping: Goal: Level of anxiety will decrease Outcome: Progressing   Problem: Pain Managment: Goal: General experience of comfort will improve and/or be controlled Outcome: Progressing

## 2024-03-07 NOTE — Progress Notes (Signed)
 Occupational Therapy Treatment Patient Details Name: Rhonda Marshall MRN: [id number] DOB: 1950-09-21 Today's Date: 03/07/2024   History of present illness 73 yo female admitted 02/28/24 with SOB, hypoxia, bilateral lower extremity edema, and confusion, and right elbow swelling, acute encephalopaty, septic shock.SABRA  BMP was elevated at 3431, EF 30-35%. PMH: AFIB, RA, anxiety, hop fracture, right elb fracture.   OT comments  Patient seen in order to progress with functional mobility and ADL activity tolerance. Patient tangential throughout session, but could be redirected with one step cues. Patient mod A for bed mobility, and mod A to complete step pivot transfer to recliner. Inconsistent pleth throughout when attempting to determine O2 needs, with patient kept on 2L throughout session. RN present at end of session and handed off with patient in recliner. OT recommendation remains appropriate; OT will continue to follow acutely.       If plan is discharge home, recommend the following:  A lot of help with walking and/or transfers;A lot of help with bathing/dressing/bathroom;Assistance with cooking/housework;Assistance with feeding;Direct supervision/assist for medications management;Direct supervision/assist for financial management;Assist for transportation;Help with stairs or ramp for entrance;Supervision due to cognitive status   Equipment Recommendations  None recommended by OT    Recommendations for Other Services      Precautions / Restrictions Precautions Precautions: Fall Recall of Precautions/Restrictions: Impaired Precaution/Restrictions Comments: Monitor O2 Restrictions Weight Bearing Restrictions Per Provider Order: No       Mobility Bed Mobility Overal bed mobility: Needs Assistance Bed Mobility: Supine to Sit     Supine to sit: Mod assist, HOB elevated, Used rails     General bed mobility comments: increased time and step by step instruction needed due to decreased  attention and tangential    Transfers Overall transfer level: Needs assistance Equipment used: Rolling walker (2 wheels) Transfers: Sit to/from Stand Sit to Stand: Mod assist, From elevated surface     Step pivot transfers: Mod assist     General transfer comment: mod A to power up, required minimal elevated surface to complete, VCs hand placement     Balance Overall balance assessment: Needs assistance, History of Falls Sitting-balance support: No upper extremity supported, Feet supported Sitting balance-Leahy Scale: Fair     Standing balance support: During functional activity, Bilateral upper extremity supported, Reliant on assistive device for balance Standing balance-Leahy Scale: Poor Standing balance comment: reliant on RW                           ADL either performed or assessed with clinical judgement   ADL Overall ADL's : Needs assistance/impaired     Grooming: Set up;Sitting                   Toilet Transfer: Moderate assistance;Ambulation;Cueing for sequencing;Cueing for safety;Rolling walker (2 wheels) Toilet Transfer Details (indicate cue type and reason): mod A for RW management         Functional mobility during ADLs: Moderate assistance;Cueing for safety;Cueing for sequencing;Rolling walker (2 wheels) General ADL Comments: Patient seen in order to progress with functional mobility and ADL activity tolerance. Patient tangential throughout session, but could be redirected with one step cues. Patient mod A for bed mobility, and mod A to complete step pivot transfer to recliner. Inconsistent pleth throughout when attempting to determine O2 needs, with patient kept on 2L throughout session. RN present at end of session and handed off with patient in recliner. OT recommendation remains appropriate; OT will continue to  follow acutely.    Extremity/Trunk Assessment              Estate agent Communication: Impaired Factors Affecting Communication: Hearing impaired   Cognition Arousal: Alert Behavior During Therapy: WFL for tasks assessed/performed Cognition: Cognition impaired   Orientation impairments: Time, Situation Awareness: Online awareness impaired Memory impairment (select all impairments): Short-term memory, Working memory Attention impairment (select first level of impairment): Focused attention Executive functioning impairment (select all impairments): Initiation, Organization, Sequencing, Problem solving OT - Cognition Comments: Patient tangential throughout session and kept thinking it was June and speaking about a previous hospital admission, POA arriving during session, but unable to discern if this is patient's baseline from conversations with POA                 Following commands: Impaired Following commands impaired: Follows one step commands inconsistently      Cueing   Cueing Techniques: Verbal cues, Gestural cues, Tactile cues  Exercises      Shoulder Instructions       General Comments POA present for session    Pertinent Vitals/ Pain       Pain Assessment Pain Assessment: Faces Faces Pain Scale: Hurts a little bit Pain Location: generalized with movement Pain Descriptors / Indicators: Grimacing, Guarding, Discomfort Pain Intervention(s): Limited activity within patient's tolerance, Monitored during session, Repositioned  Home Living                                          Prior Functioning/Environment              Frequency  Min 2X/week        Progress Toward Goals  OT Goals(current goals can now be found in the care plan section)  Progress towards OT goals: Progressing toward goals  Acute Rehab OT Goals OT Goal Formulation: With patient/family Time For Goal Achievement: 03/16/24 Potential to Achieve Goals: Good  Plan      Co-evaluation                 AM-PAC OT 6  Clicks Daily Activity     Outcome Measure   Help from another person eating meals?: None Help from another person taking care of personal grooming?: A Little Help from another person toileting, which includes using toliet, bedpan, or urinal?: A Lot Help from another person bathing (including washing, rinsing, drying)?: A Lot Help from another person to put on and taking off regular upper body clothing?: A Little Help from another person to put on and taking off regular lower body clothing?: A Lot 6 Click Score: 16    End of Session Equipment Utilized During Treatment: Gait belt;Rolling walker (2 wheels);Oxygen   OT Visit Diagnosis: Unsteadiness on feet (R26.81);Other abnormalities of gait and mobility (R26.89);Muscle weakness (generalized) (M62.81);Pain   Activity Tolerance Patient tolerated treatment well;Other (comment) (PICC line with blood upon OT entry)   Patient Left in chair;with call bell/phone within reach;with chair alarm set;with family/visitor present;with nursing/sitter in room   Nurse Communication Mobility status        Time: 1030-1056 OT Time Calculation (min): 26 min  Charges: OT General Charges $OT Visit: 1 Visit OT Treatments $Self Care/Home Management : 23-37 mins  Ronal Gift E. Jonahtan Manseau, OTR/L Acute Rehabilitation Services [phone]   Ronal Gift  Cass Edinger 03/07/2024, 12:50 PM

## 2024-03-07 NOTE — Progress Notes (Signed)
 Physical Therapy Treatment Patient Details Name: Rhonda Marshall MRN: [id number] DOB: [birth year] Today's Date: 03/07/2024   History of Present Illness 73 yo female admitted 02/28/24 with SOB, hypoxia, bilateral lower extremity edema, and confusion, and right elbow swelling, acute encephalopaty, septic shock.SABRA  BMP was elevated at 3431, EF 30-35%. PMH: AFIB, RA, anxiety, hop fracture, right elb fracture.    PT Comments   Pt admitted with above diagnosis.  Pt currently with functional limitations due to the deficits listed below (see PT Problem List). Pt seated in recliner when therapist arrived, POA present and provide insight to PLOF. Pt required min A x 2 with HHA for sit to stand  from recliner, pt able to maintain static standing with B UE support and min A to CGA, pt able to perform retrograde stepping pattern to recliner and required A for repositioning in recliner. Pt expressed back pain and generalized pain all over, PT communicated with nurse. Pt on 2 L/min and at rest 100% with standing pt O2 saturation decreased to 92% and PR elevated to 134. Pt left seated in recliner, all needs in place and set up for lunch. No change in d/c recommendations at this time. Pt will benefit from acute skilled PT to increase their independence and safety with mobility to allow discharge.      If plan is discharge home, recommend the following: A lot of help with walking and/or transfers;A lot of help with bathing/dressing/bathroom;Assistance with cooking/housework;Assist for transportation;Direct supervision/assist for medications management;Direct supervision/assist for financial management;Help with stairs or ramp for entrance   Can travel by private vehicle     No  Equipment Recommendations  None recommended by PT    Recommendations for Other Services       Precautions / Restrictions Precautions Precautions: Fall Recall of Precautions/Restrictions: Impaired Precaution/Restrictions Comments: Monitor  O2 Restrictions Weight Bearing Restrictions Per Provider Order: No     Mobility  Bed Mobility               General bed mobility comments: pt seated in recliner when PT arrived    Transfers Overall transfer level: Needs assistance Equipment used: 2 person hand held assist Transfers: Sit to/from Stand Sit to Stand: Min assist, +2 physical assistance, +2 safety/equipment           General transfer comment: cues for posture and encouraagement for standing. pt's daughter in law caregiver present at begining of therapy session and reported pt has been non ambulatory in home setting following fall resutling in forehead laceration. daughter in law reports pt pulls up on her arms for standing and pivioting tasks in home setting    Ambulation/Gait               General Gait Details: NT per pt caregiver/daughter in law pt non ambulatory prior to hospitalization   Stairs             Wheelchair Mobility     Tilt Bed    Modified Rankin (Stroke Patients Only)       Balance Overall balance assessment: Needs assistance, History of Falls Sitting-balance support: No upper extremity supported, Feet supported Sitting balance-Leahy Scale: Fair     Standing balance support: During functional activity, Bilateral upper extremity supported, Reliant on assistive device for balance Standing balance-Leahy Scale: Poor Standing balance comment: B UE support with HHA  x 2  Communication Communication Communication: Impaired Factors Affecting Communication: Hearing impaired  Cognition Arousal: Alert Behavior During Therapy: WFL for tasks assessed/performed   PT - Cognitive impairments: Difficult to assess, No family/caregiver present to determine baseline, Attention                         Following commands: Impaired Following commands impaired: Follows one step commands inconsistently    Cueing Cueing Techniques:  Verbal cues, Gestural cues, Tactile cues  Exercises      General Comments General comments (skin integrity, edema, etc.): daughter in law/caregiver/POA present for a portion of tx session and provided insight to PLOF      Pertinent Vitals/Pain Pain Assessment Pain Assessment: Faces Faces Pain Scale: Hurts little more Breathing: normal Negative Vocalization: none Facial Expression: smiling or inexpressive Body Language: relaxed Consolability: no need to console PAINAD Score: 0 Pain Location: back and generalized with movement Pain Descriptors / Indicators: Grimacing, Guarding, Discomfort Pain Intervention(s): Limited activity within patient's tolerance, Monitored during session, Repositioned    Home Living                          Prior Function            PT Goals (current goals can now be found in the care plan section) Acute Rehab PT Goals Patient Stated Goal: none stated PT Goal Formulation: With patient Time For Goal Achievement: 03/16/24 Potential to Achieve Goals: Good Progress towards PT goals: Progressing toward goals (slow)    Frequency    Min 2X/week      PT Plan      Co-evaluation              AM-PAC PT 6 Clicks Mobility   Outcome Measure  Help needed turning from your back to your side while in a flat bed without using bedrails?: A Lot Help needed moving from lying on your back to sitting on the side of a flat bed without using bedrails?: A Lot Help needed moving to and from a bed to a chair (including a wheelchair)?: A Lot Help needed standing up from a chair using your arms (e.g., wheelchair or bedside chair)?: A Lot Help needed to walk in hospital room?: A Little Help needed climbing 3-5 steps with a railing? : Total 6 Click Score: 12    End of Session Equipment Utilized During Treatment: Gait belt Activity Tolerance: Patient limited by fatigue Patient left: in chair;with call bell/phone within reach;with chair alarm  set Nurse Communication: Mobility status PT Visit Diagnosis: Unsteadiness on feet (R26.81)     Time: 8851-8792 PT Time Calculation (min) (ACUTE ONLY): 19 min  Charges:    $Therapeutic Activity: 8-22 mins PT General Charges $$ ACUTE PT VISIT: 1 Visit                     [place], PT Acute Rehab    [place] VEAR Drone 03/07/2024, 1:16 PM

## 2024-03-07 NOTE — Progress Notes (Signed)
 PROGRESS NOTE    Dannell Raczkowski  FMW:[id number] DOB: February 18, 1951 DOA: 02/28/2024 PCP: [name] LABOR, MD    Brief Narrative:  73 year old female with extensive medical problems, 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  She lives at home with her son and daughter-in-law.  In the emergency room she was found to be hypoxemic with evidence of bilateral infiltrates on the chest imaging.  She initially needed BiPAP and was admitted to stepdown unit.  Her symptoms were mostly related to chronic combined heart failure exacerbation, also treated for superadded sepsis.  There was concern for possible endocarditis and possible vegetation on the aortic valve.  Seen by cardiology and infectious disease.  Patient transitioned off BiPAP.  Remains in the hospital on oxygen , extreme debility, on IV diuresis.  Subjective: Patient seen and examined.  On my initial evaluation, patient told me that she is feeling well at rest but feels very weak and cannot walk. Reexamined patient with daughter at the bedside who is her primary caretaker at home.  According to daughter, her mental status is improving. Patient is under pain management.  And uses oxycodone  and Xanax .  Family wants to make sure she has her medications so that she is comfortable.  Patient also wants some prescription of oxycodone  on discharge as she has missed her pain management appointment. Patient is wondering whether she can have oxygen  at home.   Assessment & Plan:   Acute on chronic combined systolic and diastolic heart failure Acute hypoxemic respiratory failure secondary to above.  Keep on oxygen  to keep saturation more than 90%.  Patient clinically improving and now mostly on oxygen .  She was on BiPAP initially. Echocardiogram with ejection fraction 30 to 35%.   Good response to diuresis. Continue Lasix  60 mg IV twice daily along with potassium supplementation. Recheck electrolytes tomorrow morning.  Intake output monitoring.  Sepsis present on admission, pneumonia in both lungs due to infectious organism: Clinically improving.  Sepsis physiology improving.  ID recommended 7 days of IV antibiotics.  She will complete IV antibiotics today.  AKI superimposed on CKD stage IIIa: Baseline creatinine 1.1.  Recheck tomorrow morning.  Hypokalemia: Keep on a scheduled replacement while on high-dose Lasix .  Recheck levels tomorrow morning.  Left upper extremity swelling and ecchymosis: Previous arterial line present.  Extravasation suspected.  Seen by vascular.  No evidence of arterial compromise or pseudoaneurysm.  Acute metabolic encephalopathy due to above: Mental status is improving.  B12, VBG and folic acid unremarkable.  TSH 11.6.  Chronic A-fib: On amiodarone , metoprolol  and Eliquis .    DVT prophylaxis:  apixaban  (ELIQUIS ) tablet 5 mg   Code Status: DNR with full scope of treatment Family Communication: Daughter at the bedside Disposition Plan: Status is: Inpatient Remains inpatient appropriate because: IV diuresis     Consultants:  Cardiology Critical care  Procedures:  None  Antimicrobials:  Zosyn  8/19---     Objective: Vitals:   03/06/24 2021 03/07/24 0434 03/07/24 0832 03/07/24 0838  BP: (!) 104/90 (!) 144/73 105/63 105/63  Pulse: 95 85 93 93  Resp: 16 14    Temp: 98 F (36.7 C) 97.6 F (36.4 C)    TempSrc: Oral Oral    SpO2:  96% 100%   Weight:      Height:        Intake/Output Summary (Last 24 hours) at 03/07/2024 1356  Last data filed at 03/06/2024 1831 Gross per 24 hour  Intake --  Output 300 ml  Net -300 ml   Filed Weights   03/02/24 0500 03/03/24 0500 03/04/24 0500  Weight: 63 kg 59.9 kg 57.9 kg    Examination:  General exam: Appears calm and comfortable  Frail and debilitated.  Chronically sick  looking.  Not in any distress.  On 2 L oxygen .  Able to talk in complete sentences. Respiratory system: Clear to auscultation. Respiratory effort poor. Cardiovascular system: S1 & S2 heard, RRR.  Gastrointestinal system: Abdomen is nondistended, soft and nontender. No organomegaly or masses felt. Normal bowel sounds heard. Central nervous system: Alert and awake.  Mostly oriented.   Extremities: Symmetric 5 x 5 power.  Gross generalized weakness.  Trace bilateral pedal edema. Skin:  Extensive dark discoloration and ecchymosis of left upper extremity.  Distal neurovascular status intact.    Data Reviewed: I have personally reviewed following labs and imaging studies  CBC: Recent Labs  Lab 03/02/24 0502 03/03/24 0310 03/04/24 0355 03/05/24 0413 03/06/24 0314  WBC 4.9 6.4 9.2 7.6 7.1  NEUTROABS  --   --   --  6.5 5.9  HGB 10.2* 10.0* 10.2* 9.7* 8.7*  HCT 33.3* 33.5* 32.1* 31.5* 27.7*  MCV 86.7 87.9 85.6 86.3 86.3  PLT 147* 137* 124* 100* 100*   Basic Metabolic Panel: Recent Labs  Lab 03/02/24 0502 03/03/24 0310 03/04/24 0355 03/05/24 0413 03/06/24 0314  NA 131* 131* 126* 128* 129*  K 3.6 4.1 3.8 3.5 3.1*  CL 86* 85* 78* 80* 83*  CO2 33* 33* 31 33* 32  GLUCOSE 98 93 120* 101* 114*  BUN 28* 25* 25* 29* 33*  CREATININE 1.55* 1.22* 1.63* 1.64* 1.60*  CALCIUM 8.6* 8.8* 9.0 8.7* 8.6*  MG 1.6* 1.7 1.6* 2.2 2.1  PHOS 3.5 3.7 3.3  --   --    GFR: Estimated Creatinine Clearance: 24.4 mL/min (A) (by C-G formula based on SCr of 1.6 mg/dL (H)). Liver Function Tests: Recent Labs  Lab 03/01/24 0751 03/02/24 0502 03/03/24 0310 03/04/24 0355 03/05/24 0413  AST 25 27 27 31 28   ALT 12 12 14 14 12   ALKPHOS 113 103 103 107 101  BILITOT 1.0 1.0 1.0 1.6* 1.7*  PROT 5.5* 5.6* 6.5 7.2 6.4*  ALBUMIN  1.7* 1.7* 1.9* 2.1* 1.9*   No results for input(s): LIPASE, AMYLASE in the last 168 hours. No results for input(s): AMMONIA in the last 168 hours. Coagulation Profile: No  results for input(s): INR, PROTIME in the last 168 hours. Cardiac Enzymes: No results for input(s): CKTOTAL, CKMB, CKMBINDEX, TROPONINI in the last 168 hours. BNP (last 3 results) No results for input(s): PROBNP in the last 8760 hours. HbA1C: No results for input(s): HGBA1C in the last 72 hours. CBG: No results for input(s): GLUCAP in the last 168 hours. Lipid Profile: No results for input(s): CHOL, HDL, LDLCALC, TRIG, CHOLHDL, LDLDIRECT in the last 72 hours. Thyroid  Function Tests: No results for input(s): TSH, T4TOTAL, FREET4, T3FREE, THYROIDAB in the last 72 hours. Anemia Panel: Recent Labs    03/05/24 0413  VITAMINB12 588  FOLATE 14.3   Sepsis Labs: Recent Labs  Lab 03/05/24 2006  PROCALCITON 0.37    Recent Results (from the past 240 hours)  Culture, blood (routine x 2)     Status: None   Collection Time: 02/28/24  6:25 PM   Specimen: BLOOD  Result Value Ref Range Status   Specimen Description   Final  BLOOD BLOOD LEFT ARM Performed at [facility], [address] [place], [place] [address]    Special Requests   Final    BOTTLES DRAWN AEROBIC AND ANAEROBIC Blood Culture adequate volume Performed at [facility], [address], [place], [place] [address]    Culture   Final    NO GROWTH 5 DAYS Performed at [facility], [address], [place], [place] [address]    Report Status 03/04/2024 FINAL  Final  Resp panel by RT-PCR (RSV, Flu A&B, Covid) Anterior Nasal Swab     Status: None   Collection Time: 02/28/24  6:30 PM   Specimen: Anterior Nasal Swab  Result Value Ref Range Status   SARS Coronavirus 2 by RT PCR NEGATIVE NEGATIVE Final    Comment: (NOTE) SARS-CoV-2 target nucleic acids are NOT DETECTED.  The SARS-CoV-2 RNA is generally detectable in upper respiratory specimens during the acute phase of infection. The lowest concentration of SARS-CoV-2 viral copies this assay  can detect is 138 copies/mL. A negative result does not preclude SARS-Cov-2 infection and should not be used as the sole basis for treatment or other patient management decisions. A negative result may occur with  improper specimen collection/handling, submission of specimen other than nasopharyngeal swab, presence of viral mutation(s) within the areas targeted by this assay, and inadequate number of viral copies(<138 copies/mL). A negative result must be combined with clinical observations, patient history, and epidemiological information. The expected result is Negative.  Fact Sheet for Patients:  BloggerCourse.com  Fact Sheet for Healthcare Providers:  SeriousBroker.it  This test is no t yet approved or cleared by the [place]  FDA and  has been authorized for detection and/or diagnosis of SARS-CoV-2 by FDA under an Emergency Use Authorization (EUA). This EUA will remain  in effect (meaning this test can be used) for the duration of the COVID-19 declaration under Section 564(b)(1) of the Act, 21 U.S.C.section 360bbb-3(b)(1), unless the authorization is terminated  or revoked sooner.       Influenza A by PCR NEGATIVE NEGATIVE Final   Influenza B by PCR NEGATIVE NEGATIVE Final    Comment: (NOTE) The Xpert Xpress SARS-CoV-2/FLU/RSV plus assay is intended as an aid in the diagnosis of influenza from Nasopharyngeal swab specimens and should not be used as a sole basis for treatment. Nasal washings and aspirates are unacceptable for Xpert Xpress SARS-CoV-2/FLU/RSV testing.  Fact Sheet for Patients: BloggerCourse.com  Fact Sheet for Healthcare Providers: SeriousBroker.it  This test is not yet approved or cleared by the [place]  FDA and has been authorized for detection and/or diagnosis of SARS-CoV-2 by FDA under an Emergency Use Authorization (EUA). This EUA will  remain in effect (meaning this test can be used) for the duration of the COVID-19 declaration under Section 564(b)(1) of the Act, 21 U.S.C. section 360bbb-3(b)(1), unless the authorization is terminated or revoked.     Resp Syncytial Virus by PCR NEGATIVE NEGATIVE Final    Comment: (NOTE) Fact Sheet for Patients: BloggerCourse.com  Fact Sheet for Healthcare Providers: SeriousBroker.it  This test is not yet approved or cleared by the [place]  FDA and has been authorized for detection and/or diagnosis of SARS-CoV-2 by FDA under an Emergency Use Authorization (EUA). This EUA will remain in effect (meaning this test can be used) for the duration of the COVID-19 declaration under Section 564(b)(1) of the Act, 21 U.S.C. section 360bbb-3(b)(1), unless the authorization is terminated or revoked.  Performed at [facility], [address] [place], [place] [address]   MRSA Next Gen by PCR, Nasal     Status: None   Collection Time: 02/29/24  3:35 AM   Specimen: Nasal Mucosa; Nasal Swab  Result Value Ref Range Status   MRSA by PCR Next Gen NOT DETECTED NOT DETECTED Final    Comment: (NOTE) The GeneXpert MRSA Assay (FDA approved for NASAL specimens only), is one component of a comprehensive MRSA colonization surveillance program. It is not intended to diagnose MRSA infection nor to guide or monitor treatment for MRSA infections. Test performance is not FDA approved in patients less than 63 years old. Performed at [facility], [address] [place], [place] [address]   Culture, blood (routine x 2)     Status: None   Collection Time: 02/29/24  5:35 AM   Specimen: BLOOD  Result Value Ref Range Status   Specimen Description   Final    BLOOD BLOOD RIGHT ARM Performed at [facility], [address] [place], [place] [address]    Special Requests   Final    BOTTLES DRAWN  AEROBIC AND ANAEROBIC Blood Culture adequate volume Performed at [facility], [address], [place], [place] [address]    Culture   Final    NO GROWTH 5 DAYS Performed at [facility], [address], [place], [place] [address]    Report Status 03/05/2024 FINAL  Final         Radiology Studies: VAS US  UPPER EXTREMITY VENOUS DUPLEX Result Date: 03/06/2024 UPPER VENOUS STUDY  Patient Name:  LAINEY NELSON  Date of Exam:   03/06/2024 Medical Rec #: [id number]         Accession #:    [id number] Date of Birth: October 04, 1950         Patient Gender: F Patient Age:   35 years Exam Location:  [facility] Procedure:      VAS US  UPPER EXTREMITY VENOUS DUPLEX Referring Phys: ZACHARY BA --------------------------------------------------------------------------------  Indications: DVT Performing Technologist: Elmarie Lindau, RVT  Examination Guidelines: A complete evaluation includes B-mode imaging, spectral Doppler, color Doppler, and power Doppler as needed of all accessible portions of each vessel. Bilateral testing is considered an integral part of a complete examination. Limited examinations for reoccurring indications may be performed as noted.  Right Findings: +----------+------------+---------+-----------+----------+-------+ RIGHT     CompressiblePhasicitySpontaneousPropertiesSummary +----------+------------+---------+-----------+----------+-------+ Subclavian               Yes                                +----------+------------+---------+-----------+----------+-------+  Left Findings: +----------+------------+---------+-----------+----------+-------+ LEFT      CompressiblePhasicitySpontaneousPropertiesSummary +----------+------------+---------+-----------+----------+-------+ IJV           Full                                          +----------+------------+---------+-----------+----------+-------+ Subclavian               Yes                                 +----------+------------+---------+-----------+----------+-------+ Axillary      Full                                          +----------+------------+---------+-----------+----------+-------+  Brachial      Full                                          +----------+------------+---------+-----------+----------+-------+ Radial        Full                                          +----------+------------+---------+-----------+----------+-------+ Ulnar         Full                                          +----------+------------+---------+-----------+----------+-------+ Basilic       Full                                          +----------+------------+---------+-----------+----------+-------+ There is a probable thrombosed pseudoaneurysm/hematoma at the distal left radial artery measuring 1.77 x 1.30 cm with an active neck measuring 0.19 cm.  Summary:  Right: No evidence of thrombosis in the subclavian.  Left: No evidence of deep vein thrombosis in the upper extremity. There is a probable thrombosed pseudoaneurysm/hematoma at the distal left radial artery measuring 1.77 x 1.30 cm with an active neck measuring 0.19 cm.  *See table(s) above for measurements and observations.    Preliminary    DG Chest 1 View Result Date: 03/05/2024 CLINICAL DATA:  Hypoxia.  Shortness of breath. EXAM: CHEST  1 VIEW COMPARISON:  Chest radiograph dated 03/02/2024. FINDINGS: Right-sided PICC with tip at the cavoatrial junction. Bilateral pleural effusions, right greater left with associated partial compressive atelectasis of the lung bases. Pneumonia is not excluded there is cardiomegaly with vascular congestion and edema. No pneumothorax. No acute osseous pathology. IMPRESSION: Cardiomegaly with findings of CHF and bilateral pleural effusions. Bibasilar atelectasis or infiltrate. Electronically Signed   By: Vanetta Chou M.D.   On: 03/05/2024 16:08         Scheduled Meds:  amiodarone   100 mg Oral Daily   apixaban   5 mg Oral BID   Chlorhexidine  Gluconate Cloth  6 each Topical Q0600   furosemide   60 mg Intravenous BID   metoprolol  succinate  50 mg Oral Daily   mouth rinse  15 mL Mouth Rinse 4 times per day   pantoprazole   40 mg Oral Daily   potassium chloride   40 mEq Oral TID   Continuous Infusions:  sodium chloride  Stopped (02/29/24 1820)   piperacillin -tazobactam (ZOSYN )  IV 3.375 g (03/07/24 1239)     LOS: 8 days    Time spent: 45 minutes    Renato Applebaum, MD [facility]

## 2024-03-07 NOTE — Progress Notes (Signed)
 Pt ambulated about 10 feet on RA. O2 at rest on RA: 92%, O2 while ambulating: 87%.

## 2024-03-08 DIAGNOSIS — I5043 Acute on chronic combined systolic (congestive) and diastolic (congestive) heart failure: Secondary | ICD-10-CM | POA: Diagnosis not present

## 2024-03-08 LAB — CBC WITH DIFFERENTIAL/PLATELET
Abs Immature Granulocytes: 0.02 K/uL (ref 0.00–0.07)
Basophils Absolute: 0 K/uL (ref 0.0–0.1)
Basophils Relative: 1 %
Eosinophils Absolute: 0.1 K/uL (ref 0.0–0.5)
Eosinophils Relative: 2 %
HCT: 28.2 % — ABNORMAL LOW (ref 36.0–46.0)
Hemoglobin: 8.8 g/dL — ABNORMAL LOW (ref 12.0–15.0)
Immature Granulocytes: 0 %
Lymphocytes Relative: 13 %
Lymphs Abs: 0.7 K/uL (ref 0.7–4.0)
MCH: 27.5 pg (ref 26.0–34.0)
MCHC: 31.2 g/dL (ref 30.0–36.0)
MCV: 88.1 fL (ref 80.0–100.0)
Monocytes Absolute: 0.7 K/uL (ref 0.1–1.0)
Monocytes Relative: 13 %
Neutro Abs: 3.6 K/uL (ref 1.7–7.7)
Neutrophils Relative %: 71 %
Platelets: 125 K/uL — ABNORMAL LOW (ref 150–400)
RBC: 3.2 MIL/uL — ABNORMAL LOW (ref 3.87–5.11)
RDW: 22.3 % — ABNORMAL HIGH (ref 11.5–15.5)
WBC: 5.1 K/uL (ref 4.0–10.5)
nRBC: 0 % (ref 0.0–0.2)

## 2024-03-08 LAB — BASIC METABOLIC PANEL WITH GFR
Anion gap: 12 (ref 5–15)
BUN: 38 mg/dL — ABNORMAL HIGH (ref 8–23)
CO2: 33 mmol/L — ABNORMAL HIGH (ref 22–32)
Calcium: 9 mg/dL (ref 8.9–10.3)
Chloride: 84 mmol/L — ABNORMAL LOW (ref 98–111)
Creatinine, Ser: 1.66 mg/dL — ABNORMAL HIGH (ref 0.44–1.00)
GFR, Estimated: 32 mL/min — ABNORMAL LOW (ref >60)
Glucose, Bld: 102 mg/dL — ABNORMAL HIGH (ref 70–99)
Potassium: 4.9 mmol/L (ref 3.5–5.1)
Sodium: 129 mmol/L — ABNORMAL LOW (ref 135–145)

## 2024-03-08 LAB — MAGNESIUM: Magnesium: 2 mg/dL (ref 1.7–2.4)

## 2024-03-08 MED ORDER — FUROSEMIDE 40 MG PO TABS: 40.0000 mg | ORAL_TABLET | Freq: Every day | ORAL | 11 refills | Status: AC

## 2024-03-08 MED ORDER — OXYCODONE-ACETAMINOPHEN 10-325 MG PO TABS: 1.0000 | ORAL_TABLET | ORAL | 0 refills | Status: AC | PRN

## 2024-03-08 NOTE — Discharge Summary (Signed)
 Physician Discharge Summary  Rhonda Marshall FMW:[id number] DOB: 09/22/1950 DOA: 02/28/2024  PCP: [name] LABOR, MD  Admit date: 02/28/2024 Discharge date: 03/08/2024  Admitted From: Home Disposition: Home with home health  Recommendations for Outpatient Follow-up:  Follow up with PCP in 1-2 weeks Please obtain BMP/CBC in one week Cardiology to schedule follow-up  Home Health: PT/OT Equipment/Devices: Hospital bed, oxygen   Discharge Condition: Stable CODE STATUS: DNR with intervention Diet recommendation: Low-salt diet, nutritional supplements  Discharge summary: 73 year old female with extensive medical problems, 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  She lives at home with her son and daughter-in-law.  In the emergency room, she was found to be hypoxemic with evidence of bilateral infiltrates on the chest imaging.  She initially needed BiPAP and was admitted to stepdown unit.  Her symptoms were mostly related to chronic combined heart failure exacerbation, also treated for superadded sepsis.  There was concern for possible endocarditis and possible vegetation on the aortic valve.  Seen by cardiology and infectious disease and determined that this is not endocarditis. Patient transitioned off BiPAP.  Treated with IV diuresis with good response and now able to maintain on nasal cannula oxygen .  Chronically sick, currently stabilized to transition home with family.  She has profound debility and needs a lot of support system.  Treated for following conditions.     Assessment & plan of care:   Acute on chronic combined systolic and diastolic heart failure. Acute hypoxemic respiratory failure secondary to heart failure exacerbation.  Patient was treated symptomatically.  Also followed by cardiology.   Echocardiogram with ejection fraction 30 to 35%.  She was treated with high-dose IV Lasix .  As per cardiology recommendation, will discharge on Lasix  40 mg twice daily.  She is back on Toprol  XL. GDMT, unable to tolerate losartan  due to low blood pressure and worsening renal function.  Will discontinue until cardiology follow-up as outpatient.  Electrolytes are adequate. She will also need oxygen  with mobility.   Sepsis present on admission, pneumonia in both lungs due to infectious organism: Clinically improving.  Sepsis physiology improving.  ID recommended 7 days of IV antibiotics.  She completed 7 days of broad-spectrum antibiotic in the hospital.  AKI superimposed on CKD stage IIIa: Baseline creatinine 1.1.  Creatinine 1.6 today.  Please recheck in 1 week.   Left upper extremity swelling and ecchymosis: Previous arterial line present.  Extravasation suspected.  Seen by vascular.  No evidence of arterial compromise or pseudoaneurysm.  Cold compress.   Acute metabolic encephalopathy due to above: Mental status is improving.  B12, VBG and folic acid unremarkable.  TSH 11.6.  Stabilized now.   Chronic A-fib: On amiodarone , metoprolol  and Eliquis .  Rate controlled.  Chronic pain management: Patient follows up at [facility] for chronic pain management.  She missed her appointment last week.  She will be given 1 week worth of Percocet.  She already has benzodiazepine with her.  Follow-up at [facility].  Medically stable to discharge home today.  She has adequate support system at home.  Lives with family.  She will also need devices including hospital bed and oxygen .  Stable to discharge.  High risk of readmission.  Discharge Diagnoses:  Principal Problem:   Acute on chronic combined systolic and diastolic CHF (congestive heart failure) (HCC) Active Problems:   Pneumonia of both lungs due  to infectious organism   Acute renal failure superimposed on stage 3a chronic kidney disease  (HCC)   Acute hypoxemic respiratory failure (HCC)   Bilateral pleural effusion   Severe sepsis (HCC)   Acute encephalopathy   Shock (HCC)   Leukopenia   Aortic valve vegetation   H/O endocarditis   Aspiration pneumonia (HCC)   Chronic atrial fibrillation (HCC)   Left upper extremity swelling    Discharge Instructions  Discharge Instructions     Diet - low sodium heart healthy   Complete by: As directed    Discharge wound care:   Complete by: As directed    Protect pressure points,   Increase activity slowly   Complete by: As directed       Allergies as of 03/08/2024       Reactions   Clindamycin/lincomycin Rash   Latex Rash   Plaquenil  [hydroxychloroquine ] Other (See Comments)   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   ALPRAZolam  1 MG tablet Commonly known as: XANAX  Take 1 mg by mouth 3 (three) times daily as needed for anxiety.   amiodarone  200 MG tablet Commonly known as: PACERONE  Take 0.5 tablets (100 mg total) by mouth daily.   diclofenac  Sodium 1 % Gel Commonly known as: VOLTAREN  Apply 2 g topically 4 (four) times daily as needed (pain).   Eliquis  5 MG Tabs tablet Generic drug: apixaban  Take 1 tablet (5 mg total) by mouth 2 (two) times daily.   furosemide  40 MG tablet Commonly known as: Lasix  Take 1 tablet (40 mg total) by mouth daily. What changed:  medication strength how much to take when to take this reasons to take this    loperamide  2 MG tablet Commonly known as: IMODIUM  A-D Take 2 mg by mouth 4 (four) times daily as needed for diarrhea or loose stools.   methimazole  5 MG tablet Commonly known as: TAPAZOLE  Take 0.5 tablets (2.5 mg total) by mouth daily.   metoprolol  succinate 100 MG 24 hr tablet Commonly known as: TOPROL -XL Take 50 mg by mouth 2 (two) times daily. Take with or immediately following a meal.   oxyCODONE -acetaminophen  10-325 MG tablet Commonly known as: PERCOCET Take 1 tablet by mouth every 4 (four) hours as needed for up to 7 days for pain. What changed:  when to take this reasons to take this               Durable Medical Equipment  (From admission, onward)           Start     Ordered   03/08/24 0848  For home use only DME oxygen   Once       Question Answer Comment  Length of Need Lifetime   Mode or (Route) Nasal cannula   Liters per Minute 2   Oxygen  delivery system Gas      03/08/24 0847   03/03/24  9052  For home use only DME Hospital bed  Once       Question Answer Comment  Length of Need Lifetime   Bed type Semi-electric      03/03/24 0950              Discharge Care Instructions  (From admission, onward)           Start     Ordered   03/08/24 0000  Discharge wound care:       Comments: Protect pressure points,   03/08/24 0952            Follow-up Information     [facility] Follow up.   Specialty: Home Health Services Why: Hedda will provide nursing in the home after discharge. 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     Consultations: Cardiology   Procedures/Studies: VAS US  UPPER EXTREMITY VENOUS DUPLEX Result Date: 03/07/2024 UPPER VENOUS STUDY  Patient Name:  Rhonda Marshall  Date  of Exam:   03/06/2024 Medical Rec #: [id number]         Accession #:    [id number] Date of Birth: 04-13-1951         Patient Gender: F Patient Age:   53 years Exam Location:  [facility] Procedure:      VAS US  UPPER EXTREMITY VENOUS DUPLEX Referring Phys: ZACHARY BA --------------------------------------------------------------------------------  Indications: DVT Performing Technologist: Elmarie Lindau, RVT  Examination Guidelines: A complete evaluation includes B-mode imaging, spectral Doppler, color Doppler, and power Doppler as needed of all accessible portions of each vessel. Bilateral testing is considered an integral part of a complete examination. Limited examinations for reoccurring indications may be performed as noted.  Right Findings: +----------+------------+---------+-----------+----------+-------+ RIGHT     CompressiblePhasicitySpontaneousPropertiesSummary +----------+------------+---------+-----------+----------+-------+ Subclavian               Yes                                +----------+------------+---------+-----------+----------+-------+  Left Findings: +----------+------------+---------+-----------+----------+-------+ LEFT      CompressiblePhasicitySpontaneousPropertiesSummary +----------+------------+---------+-----------+----------+-------+ IJV           Full                                          +----------+------------+---------+-----------+----------+-------+ Subclavian               Yes                                +----------+------------+---------+-----------+----------+-------+ Axillary      Full                                          +----------+------------+---------+-----------+----------+-------+ Brachial      Full                                          +----------+------------+---------+-----------+----------+-------+  Radial        Full                                           +----------+------------+---------+-----------+----------+-------+ Ulnar         Full                                          +----------+------------+---------+-----------+----------+-------+ Basilic       Full                                          +----------+------------+---------+-----------+----------+-------+ There is a probable thrombosed pseudoaneurysm/hematoma at the distal left radial artery measuring 1.77 x 1.30 cm with an active neck measuring 0.19 cm.  Summary:  Right: No evidence of thrombosis in the subclavian.  Left: No evidence of deep vein thrombosis in the upper extremity. There is a probable thrombosed pseudoaneurysm/hematoma at the distal left radial artery measuring 1.77 x 1.30 cm with an active neck measuring 0.19 cm.  *See table(s) above for measurements and observations.  Diagnosing physician: Gaile New MD Electronically signed by Gaile New MD on 03/07/2024 at 3:10:21 PM.    Final    DG Chest 1 View Result Date: 03/05/2024 CLINICAL DATA:  Hypoxia.  Shortness of breath. EXAM: CHEST  1 VIEW COMPARISON:  Chest radiograph dated 03/02/2024. FINDINGS: Right-sided PICC with tip at the cavoatrial junction. Bilateral pleural effusions, right greater left with associated partial compressive atelectasis of the lung bases. Pneumonia is not excluded there is cardiomegaly with vascular congestion and edema. No pneumothorax. No acute osseous pathology. IMPRESSION: Cardiomegaly with findings of CHF and bilateral pleural effusions. Bibasilar atelectasis or infiltrate. Electronically Signed   By: Vanetta Chou M.D.   On: 03/05/2024 16:08   DG Chest 1 View Result Date: 03/02/2024 CLINICAL DATA:  Acute hypoxic respiratory failure. EXAM: CHEST  1 VIEW COMPARISON:  Chest radiograph dated 03/02/2024. FINDINGS: Right-sided PICC in similar position. Bilateral pleural effusions, right greater than left with associated compressive atelectasis of the lung bases versus pneumonia. No  pneumothorax. Stable cardiac silhouette no acute osseous pathology. IMPRESSION: Bilateral pleural effusions, right greater than left with associated compressive atelectasis of the lung bases versus pneumonia. Electronically Signed   By: Vanetta Chou M.D.   On: 03/02/2024 15:42   DG Chest Port 1 View Result Date: 03/02/2024 CLINICAL DATA:  Heart failure EXAM: PORTABLE CHEST 1 VIEW COMPARISON:  02/29/2024 FINDINGS: Stable right PICC tip overlies the mid to lower SVC. Low lung volumes. Similar small-to-moderate right and small left pleural effusions with bibasilar opacities. No pneumothorax. The visualized osseous structures are unchanged. IMPRESSION: Low lung volumes. Similar small-to-moderate right and small left pleural effusions with bibasilar opacities. Electronically Signed   By: Harrietta Sherry M.D.   On: 03/02/2024 11:33   DG CHEST PORT 1 VIEW Result Date: 02/29/2024 CLINICAL DATA:  PICC placement EXAM: PORTABLE CHEST 1 VIEW COMPARISON:  Chest x-ray 02/28/2024 FINDINGS: Right upper extremity PICC terminates in the mid SVC. There is no pneumothorax. Small right pleural effusion is unchanged. Small left pleural effusion has slightly decreased. Patchy bibasilar infiltrates persist. There is central pulmonary vascular congestion. The heart is mildly enlarged. The osseous structures  are stable. IMPRESSION: 1. Right upper extremity PICC terminates in the mid SVC. No pneumothorax. 2. Small bilateral pleural effusions, slightly decreased on the left. 3. Patchy bibasilar infiltrates persist. Electronically Signed   By: Greig Pique M.D.   On: 02/29/2024 15:36   US  EKG SITE RITE Result Date: 02/29/2024 If Site Rite image not attached, placement could not be confirmed due to current cardiac rhythm.  ECHOCARDIOGRAM LIMITED Result Date: 02/29/2024    ECHOCARDIOGRAM LIMITED REPORT   Patient Name:   Rhonda Marshall Date of Exam: 02/29/2024 Medical Rec #:  [id number]        Height:       62.0 in Accession #:     [id number]       Weight:       136.0 lb Date of Birth:  [birth year]        BSA:          1.623 m Patient Age:    73 years         BP:           73/34 mmHg Patient Gender: F                HR:           83 bpm. Exam Location:  Inpatient Procedure: 2D Echo, Limited Echo, Cardiac Doppler, Color Doppler and Strain            Analysis (Both Spectral and Color Flow Doppler were utilized during            procedure). Indications:     CHF  History:         Patient has prior history of Echocardiogram examinations, most                  recent 01/01/2024. CHF, Arrythmias:Atrial Fibrillation,                  Signs/Symptoms:Shortness of Breath and Edema; Risk                  Factors:Hypertension.  Sonographer:     Therisa Crouch Referring Phys:  [id number] JESSICA MARSHALL Diagnosing Phys: Ezra McleanMD IMPRESSIONS  1. Left ventricular ejection fraction, by estimation, is 30 to 35%. Left ventricular ejection fraction by 2D MOD biplane is 33.5 %. The left ventricle has moderately decreased function. The left ventricle demonstrates global hypokinesis with septal-lateral dyssynchrony suggestive of LBBB. There is mild concentric left ventricular hypertrophy. Left ventricular diastolic parameters are indeterminate.  2. Right ventricular systolic function is mildly reduced. The right ventricular size is normal. There is normal pulmonary artery systolic pressure. The estimated right ventricular systolic pressure is 25.0 mmHg.  3. Left atrial size was severely dilated.  4. Right atrial size was mildly dilated.  5. There is mild calcification of the aortic valve. Aortic valve regurgitation is not visualized. No aortic stenosis is present. 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  6. The mitral valve is normal in structure. Trivial mitral valve regurgitation. No evidence of mitral stenosis.  7. The  inferior vena cava is normal in size with <50% respiratory variability, suggesting right atrial pressure of 8 mmHg.  8. Left pleural effusion noted. A small pericardial effusion is present.  9. The patient was in atrial fibrillation. FINDINGS  Left Ventricle: Left ventricular ejection fraction, by estimation, is 30 to 35%. Left ventricular ejection  fraction by 2D MOD biplane is 33.5 %. The left ventricle has moderately decreased function. The left ventricle demonstrates global hypokinesis. The average left ventricular global longitudinal strain is -8.6 %. Strain was performed and the global longitudinal strain is abnormal. The left ventricular internal cavity size was normal in size. There is mild concentric left ventricular hypertrophy. Left ventricular diastolic parameters are indeterminate. Right Ventricle: The right ventricular size is normal. Right ventricular systolic function is mildly reduced. There is normal pulmonary artery systolic pressure. The tricuspid regurgitant velocity is 2.06 m/s, and with an assumed right atrial pressure of  8 mmHg, the estimated right ventricular systolic pressure is 25.0 mmHg. Left Atrium: Left atrial size was severely dilated. Right Atrium: Right atrial size was mildly dilated. Pericardium: Left pleural effusion noted. A small pericardial effusion is present. Mitral Valve: The mitral valve is normal in structure. Trivial mitral valve regurgitation. No evidence of mitral valve stenosis. Tricuspid Valve: The tricuspid valve is normal in structure. Tricuspid valve regurgitation is mild. Aortic Valve: There is mild calcification of the aortic valve. Aortic valve regurgitation is not visualized. No aortic stenosis is present. Venous: The inferior vena cava is normal in size with less than 50% respiratory variability, suggesting right atrial pressure of 8 mmHg. IAS/Shunts: No atrial level shunt detected by color flow Doppler. LEFT VENTRICLE PLAX 2D                        Biplane EF  (MOD) LVIDd:         3.90 cm         LV Biplane EF:   Left LVIDs:         3.40 cm                          ventricular LV PW:         1.10 cm                          ejection LV IVS:        1.20 cm                          fraction by                                                 2D MOD                                                 biplane is LV Volumes (MOD)                                33.5 %. LV vol d, MOD    80.7 ml A2C: LV vol d, MOD    76.4 ml       2D Longitudinal A4C:                           Strain LV vol s, MOD    50.8 ml  2D Strain GLS   -8.6 % A2C:                           Avg: LV vol s, MOD    53.6 ml A4C: LV SV MOD A2C:   29.9 ml LV SV MOD A4C:   76.4 ml LV SV MOD BP:    27.0 ml RIGHT VENTRICLE          IVC RV Basal diam:  3.80 cm  IVC diam: 1.90 cm RV Mid diam:    2.80 cm LEFT ATRIUM              Index        RIGHT ATRIUM           Index LA diam:        3.80 cm  2.34 cm/m   RA Area:     18.30 cm LA Vol (A2C):   109.0 ml 67.17 ml/m  RA Volume:   47.40 ml  29.21 ml/m LA Vol (A4C):   103.0 ml 63.47 ml/m LA Biplane Vol: 106.0 ml 65.32 ml/m  TRICUSPID VALVE TR Peak grad:   17.0 mmHg TR Vmax:        206.00 cm/s Dalton McleanMD Electronically signed by Ezra Kanner Signature Date/Time: 02/29/2024/8:17:39 AM    Final (Updated)    CT HEAD WO CONTRAST ( ) Result Date: 02/29/2024 CLINICAL DATA:  Delirium EXAM: CT HEAD WITHOUT CONTRAST TECHNIQUE: Contiguous axial images were obtained from the base of the skull through the vertex without intravenous contrast. RADIATION DOSE REDUCTION: This exam was performed according to the departmental dose-optimization program which includes automated exposure control, adjustment of the mA and/or kV according to patient size and/or use of iterative reconstruction technique. COMPARISON:  CT head 01/03/2024 FINDINGS: Brain: No intracranial hemorrhage, mass effect, or evidence of acute infarct. No hydrocephalus. No extra-axial fluid collection.  Age-commensurate cerebral atrophy and chronic small vessel ischemic disease. Vascular: No hyperdense vessel. Intracranial arterial calcification. Skull: No fracture or focal lesion. Sinuses/Orbits: No acute finding. Other: None. IMPRESSION: No acute intracranial abnormality. Electronically Signed   By: Norman Gatlin M.D.   On: 02/29/2024 03:04   DG Elbow 2 Views Right Result Date: 02/28/2024 CLINICAL DATA:  Pain. EXAM: RIGHT ELBOW - 2 VIEW COMPARISON:  None Available. FINDINGS: There is no evidence of fracture, dislocation, or definite joint effusion. There is no evidence of arthropathy or other focal bone abnormality. No erosive change. Prominent generalized soft tissue edema. IMPRESSION: Prominent generalized soft tissue edema. No acute osseous abnormality. Electronically Signed   By: Andrea Gasman M.D.   On: 02/28/2024 18:44   DG Chest Port 1 View Result Date: 02/28/2024 CLINICAL DATA:  Shortness of breath. EXAM: PORTABLE CHEST 1 VIEW COMPARISON:  Most recent radiograph 01/07/2024 FINDINGS: Increased bilateral pleural effusions, partially loculated on the right. Associated bibasilar opacities. The heart is enlarged but stable. Slight vascular congestion. No pneumothorax. IMPRESSION: 1. Increased bilateral pleural effusions, partially loculated on the right. Associated bibasilar opacities. 2. Cardiomegaly with slight vascular congestion. Electronically Signed   By: Andrea Gasman M.D.   On: 02/28/2024 18:44   (Echo, Carotid, EGD, Colonoscopy, ERCP)    Subjective: Patient seen and examined.  No overnight events.  She tells me she thinks she is doing well.  She was even able to walk with a walker yesterday.  Leg swelling has improved.  Comfortable with plan to go home.   Discharge Exam: Vitals:   03/08/24  0856 03/08/24 0901  BP: 116/82 116/82  Pulse: 89 89  Resp: 17   Temp:    SpO2:     Vitals:   03/08/24 0500 03/08/24 0740 03/08/24 0856 03/08/24 0901  BP: 97/80  116/82 116/82   Pulse: 95  89 89  Resp:  10 17   Temp: 98 F (36.7 C)     TempSrc: Oral     SpO2: 99%     Weight: 54.4 kg     Height:        General: Pt is alert, awake, not in acute distress Pleasant interactive.  Chronically sick looking.  Frail and debilitated. Cardiovascular: Irregularly irregular, S1/S2 +, no rubs, no gallops Respiratory: CTA bilaterally, no wheezing, no rhonchi Abdominal: Soft, NT, ND, bowel sounds + Extremities: Trace bilateral pedal edema. Extensive pigmentation of the left arm.  Distal neurovascular status intact.    The results of significant diagnostics from this hospitalization (including imaging, microbiology, ancillary and laboratory) are listed below for reference.     Microbiology: Recent Results (from the past 240 hours)  Culture, blood (routine x 2)     Status: None   Collection Time: 02/28/24  6:25 PM   Specimen: BLOOD  Result Value Ref Range Status   Specimen Description   Final    BLOOD BLOOD LEFT ARM Performed at [facility], [address] [place], [place] [address]    Special Requests   Final    BOTTLES DRAWN AEROBIC AND ANAEROBIC Blood Culture adequate volume Performed at [facility], [address], [place], [place] [address]    Culture   Final    NO GROWTH 5 DAYS Performed at [facility], [address], [place], [place] [address]    Report Status 03/04/2024 FINAL  Final  Resp panel by RT-PCR (RSV, Flu A&B, Covid) Anterior Nasal Swab     Status: None   Collection Time: 02/28/24  6:30 PM   Specimen: Anterior Nasal Swab  Result Value Ref Range Status   SARS Coronavirus 2 by RT PCR NEGATIVE NEGATIVE Final    Comment: (NOTE) SARS-CoV-2 target nucleic acids are NOT DETECTED.  The SARS-CoV-2 RNA is generally detectable in upper respiratory specimens during the acute phase of infection. The lowest concentration of SARS-CoV-2 viral copies this assay can detect is 138 copies/mL. A negative result  does not preclude SARS-Cov-2 infection and should not be used as the sole basis for treatment or other patient management decisions. A negative result may occur with  improper specimen collection/handling, submission of specimen other than nasopharyngeal swab, presence of viral mutation(s) within the areas targeted by this assay, and inadequate number of viral copies(<138 copies/mL). A negative result must be combined with clinical observations, patient history, and epidemiological information. The expected result is Negative.  Fact Sheet for Patients:  BloggerCourse.com  Fact Sheet for Healthcare Providers:  SeriousBroker.it  This test is no t yet approved or cleared by the [place]  FDA and  has been authorized for detection and/or diagnosis of SARS-CoV-2 by FDA under an Emergency Use Authorization (EUA). This EUA will remain  in effect (meaning this test can be used) for the duration of the COVID-19 declaration under Section 564(b)(1) of the Act, 21 U.S.C.section 360bbb-3(b)(1), unless the authorization is terminated  or revoked sooner.       Influenza A by PCR NEGATIVE NEGATIVE Final   Influenza B by PCR NEGATIVE NEGATIVE Final    Comment: (NOTE) The Xpert Xpress SARS-CoV-2/FLU/RSV plus  assay is intended as an aid in the diagnosis of influenza from Nasopharyngeal swab specimens and should not be used as a sole basis for treatment. Nasal washings and aspirates are unacceptable for Xpert Xpress SARS-CoV-2/FLU/RSV testing.  Fact Sheet for Patients: BloggerCourse.com  Fact Sheet for Healthcare Providers: SeriousBroker.it  This test is not yet approved or cleared by the [place]  FDA and has been authorized for detection and/or diagnosis of SARS-CoV-2 by FDA under an Emergency Use Authorization (EUA). This EUA will remain in effect (meaning this test can be used) for  the duration of the COVID-19 declaration under Section 564(b)(1) of the Act, 21 U.S.C. section 360bbb-3(b)(1), unless the authorization is terminated or revoked.     Resp Syncytial Virus by PCR NEGATIVE NEGATIVE Final    Comment: (NOTE) Fact Sheet for Patients: BloggerCourse.com  Fact Sheet for Healthcare Providers: SeriousBroker.it  This test is not yet approved or cleared by the [place]  FDA and has been authorized for detection and/or diagnosis of SARS-CoV-2 by FDA under an Emergency Use Authorization (EUA). This EUA will remain in effect (meaning this test can be used) for the duration of the COVID-19 declaration under Section 564(b)(1) of the Act, 21 U.S.C. section 360bbb-3(b)(1), unless the authorization is terminated or revoked.  Performed at [facility], [address], [place], [place] [address]   MRSA Next Gen by PCR, Nasal     Status: None   Collection Time: 02/29/24  3:35 AM   Specimen: Nasal Mucosa; Nasal Swab  Result Value Ref Range Status   MRSA by PCR Next Gen NOT DETECTED NOT DETECTED Final    Comment: (NOTE) The GeneXpert MRSA Assay (FDA approved for NASAL specimens only), is one component of a comprehensive MRSA colonization surveillance program. It is not intended to diagnose MRSA infection nor to guide or monitor treatment for MRSA infections. Test performance is not FDA approved in patients less than 41 years old. Performed at [facility], [address], [place], [place] [address]   Culture, blood (routine x 2)     Status: None   Collection Time: 02/29/24  5:35 AM   Specimen: BLOOD  Result Value Ref Range Status   Specimen Description   Final    BLOOD BLOOD RIGHT ARM Performed at [facility], [address] [place], [place] [address]    Special Requests   Final    BOTTLES DRAWN AEROBIC AND ANAEROBIC Blood Culture adequate  volume Performed at [facility], [address], [place], [place] [address]    Culture   Final    NO GROWTH 5 DAYS Performed at [facility], [address] [place], [place] [address]    Report Status 03/05/2024 FINAL  Final     Labs: BNP (last 3 results) Recent Labs    12/31/23 0108 02/28/24 1825 03/05/24 2006  BNP >4,500.0* 3,431.8* 4,480.7*   Basic Metabolic Panel: Recent Labs  Lab 03/02/24 0502 03/03/24 0310 03/04/24 0355 03/05/24 0413 03/06/24 0314 03/08/24 0137  NA 131* 131* 126* 128* 129* 129*  K 3.6 4.1 3.8 3.5 3.1* 4.9  CL 86* 85* 78* 80* 83* 84*  CO2 33* 33* 31 33* 32 33*  GLUCOSE 98 93 120* 101* 114* 102*  BUN 28* 25* 25* 29* 33* 38*  CREATININE 1.55* 1.22* 1.63* 1.64* 1.60* 1.66*  CALCIUM 8.6* 8.8* 9.0 8.7* 8.6* 9.0  MG 1.6* 1.7 1.6* 2.2 2.1 2.0  PHOS 3.5 3.7 3.3  --   --   --  Liver Function Tests: Recent Labs  Lab 03/02/24 0502 03/03/24 0310 03/04/24 0355 03/05/24 0413  AST 27 27 31 28   ALT 12 14 14 12   ALKPHOS 103 103 107 101  BILITOT 1.0 1.0 1.6* 1.7*  PROT 5.6* 6.5 7.2 6.4*  ALBUMIN  1.7* 1.9* 2.1* 1.9*   No results for input(s): LIPASE, AMYLASE in the last 168 hours. No results for input(s): AMMONIA in the last 168 hours. CBC: Recent Labs  Lab 03/03/24 0310 03/04/24 0355 03/05/24 0413 03/06/24 0314 03/08/24 0137  WBC 6.4 9.2 7.6 7.1 5.1  NEUTROABS  --   --  6.5 5.9 3.6  HGB 10.0* 10.2* 9.7* 8.7* 8.8*  HCT 33.5* 32.1* 31.5* 27.7* 28.2*  MCV 87.9 85.6 86.3 86.3 88.1  PLT 137* 124* 100* 100* 125*   Cardiac Enzymes: No results for input(s): CKTOTAL, CKMB, CKMBINDEX, TROPONINI in the last 168 hours. BNP: Invalid input(s): POCBNP CBG: No results for input(s): GLUCAP in the last 168 hours. D-Dimer No results for input(s): DDIMER in the last 72 hours. Hgb A1c No results for input(s): HGBA1C in the last 72 hours. Lipid Profile No results for input(s): CHOL, HDL, LDLCALC,  TRIG, CHOLHDL, LDLDIRECT in the last 72 hours. Thyroid  function studies No results for input(s): TSH, T4TOTAL, T3FREE, THYROIDAB in the last 72 hours.  Invalid input(s): FREET3 Anemia work up No results for input(s): VITAMINB12, FOLATE, FERRITIN, TIBC, IRON, RETICCTPCT in the last 72 hours. Urinalysis    Component Value Date/Time   COLORURINE YELLOW 02/28/2024 2106   APPEARANCEUR HAZY (A) 02/28/2024 2106   LABSPEC 1.013 02/28/2024 2106   PHURINE 5.0 02/28/2024 2106   GLUCOSEU NEGATIVE 02/28/2024 2106   HGBUR NEGATIVE 02/28/2024 2106   BILIRUBINUR NEGATIVE 02/28/2024 2106   KETONESUR NEGATIVE 02/28/2024 2106   PROTEINUR 100 (A) 02/28/2024 2106   UROBILINOGEN 0.2 11/05/2010 1220   NITRITE NEGATIVE 02/28/2024 2106   LEUKOCYTESUR NEGATIVE 02/28/2024 2106   Sepsis Labs Recent Labs  Lab 03/04/24 0355 03/05/24 0413 03/06/24 0314 03/08/24 0137  WBC 9.2 7.6 7.1 5.1   Microbiology Recent Results (from the past 240 hours)  Culture, blood (routine x 2)     Status: None   Collection Time: 02/28/24  6:25 PM   Specimen: BLOOD  Result Value Ref Range Status   Specimen Description   Final    BLOOD BLOOD LEFT ARM Performed at [facility], [address] [place], [place] [address]    Special Requests   Final    BOTTLES DRAWN AEROBIC AND ANAEROBIC Blood Culture adequate volume Performed at [facility], [address] [place], [place] [address]    Culture   Final    NO GROWTH 5 DAYS Performed at [facility], [address], [place], [place] [address]    Report Status 03/04/2024 FINAL  Final  Resp panel by RT-PCR (RSV, Flu A&B, Covid) Anterior Nasal Swab     Status: None   Collection Time: 02/28/24  6:30 PM   Specimen: Anterior Nasal Swab  Result Value Ref Range Status   SARS Coronavirus 2 by RT PCR NEGATIVE NEGATIVE Final    Comment: (NOTE) SARS-CoV-2 target nucleic acids are NOT DETECTED.  The  SARS-CoV-2 RNA is generally detectable in upper respiratory specimens during the acute phase of infection. The lowest concentration of SARS-CoV-2 viral copies this assay can detect is 138 copies/mL. A negative result does not preclude SARS-Cov-2 infection and should not be used as the sole basis for treatment or other patient  management decisions. A negative result may occur with  improper specimen collection/handling, submission of specimen other than nasopharyngeal swab, presence of viral mutation(s) within the areas targeted by this assay, and inadequate number of viral copies(<138 copies/mL). A negative result must be combined with clinical observations, patient history, and epidemiological information. The expected result is Negative.  Fact Sheet for Patients:  BloggerCourse.com  Fact Sheet for Healthcare Providers:  SeriousBroker.it  This test is no t yet approved or cleared by the [place]  FDA and  has been authorized for detection and/or diagnosis of SARS-CoV-2 by FDA under an Emergency Use Authorization (EUA). This EUA will remain  in effect (meaning this test can be used) for the duration of the COVID-19 declaration under Section 564(b)(1) of the Act, 21 U.S.C.section 360bbb-3(b)(1), unless the authorization is terminated  or revoked sooner.       Influenza A by PCR NEGATIVE NEGATIVE Final   Influenza B by PCR NEGATIVE NEGATIVE Final    Comment: (NOTE) The Xpert Xpress SARS-CoV-2/FLU/RSV plus assay is intended as an aid in the diagnosis of influenza from Nasopharyngeal swab specimens and should not be used as a sole basis for treatment. Nasal washings and aspirates are unacceptable for Xpert Xpress SARS-CoV-2/FLU/RSV testing.  Fact Sheet for Patients: BloggerCourse.com  Fact Sheet for Healthcare Providers: SeriousBroker.it  This test is not yet approved or  cleared by the [place]  FDA and has been authorized for detection and/or diagnosis of SARS-CoV-2 by FDA under an Emergency Use Authorization (EUA). This EUA will remain in effect (meaning this test can be used) for the duration of the COVID-19 declaration under Section 564(b)(1) of the Act, 21 U.S.C. section 360bbb-3(b)(1), unless the authorization is terminated or revoked.     Resp Syncytial Virus by PCR NEGATIVE NEGATIVE Final    Comment: (NOTE) Fact Sheet for Patients: BloggerCourse.com  Fact Sheet for Healthcare Providers: SeriousBroker.it  This test is not yet approved or cleared by the [place]  FDA and has been authorized for detection and/or diagnosis of SARS-CoV-2 by FDA under an Emergency Use Authorization (EUA). This EUA will remain in effect (meaning this test can be used) for the duration of the COVID-19 declaration under Section 564(b)(1) of the Act, 21 U.S.C. section 360bbb-3(b)(1), unless the authorization is terminated or revoked.  Performed at [facility], [address] [place], [place] [address]   MRSA Next Gen by PCR, Nasal     Status: None   Collection Time: 02/29/24  3:35 AM   Specimen: Nasal Mucosa; Nasal Swab  Result Value Ref Range Status   MRSA by PCR Next Gen NOT DETECTED NOT DETECTED Final    Comment: (NOTE) The GeneXpert MRSA Assay (FDA approved for NASAL specimens only), is one component of a comprehensive MRSA colonization surveillance program. It is not intended to diagnose MRSA infection nor to guide or monitor treatment for MRSA infections. Test performance is not FDA approved in patients less than 74 years old. Performed at [facility], [address], [place], [place] [address]   Culture, blood (routine x 2)     Status: None   Collection Time: 02/29/24  5:35 AM   Specimen: BLOOD  Result Value Ref Range Status   Specimen Description    Final    BLOOD BLOOD RIGHT ARM Performed at [facility], [address], [place], [place] [address]    Special Requests   Final    BOTTLES DRAWN AEROBIC AND ANAEROBIC Blood Culture adequate volume Performed  at [facility], [address], [place], [place] [address]    Culture   Final    NO GROWTH 5 DAYS Performed at [facility], [address] [place], [place] [address]    Report Status 03/05/2024 FINAL  Final     Time coordinating discharge: 35 minutes  SIGNED:   Renato Applebaum, MD  [facility] 03/08/2024, 10:34 AM

## 2024-03-08 NOTE — Progress Notes (Signed)
 PICC removed per MD order. Pressure dressing applied x2 due to prolonged bleeding. Instructed primary RN to re-assess prior to discharge to ensure that dressing is intact.   Cris Gibby R Tiaria Biby, RN

## 2024-03-08 NOTE — TOC Transition Note (Signed)
 Transition of Care [facility]) - Discharge Note  Patient Details  Name: Rhonda Marshall MRN: [id number] Date of Birth: 07/11/1951  Transition of Care [facility]) CM/SW Contact:  Duwaine GORMAN Aran, LCSW Phone Number: 03/08/2024, 10:31 AM  Clinical Narrative: Patient will discharge home today and will need home oxygen . Avelina agreeable to [employer] also providing home oxygen  in addition to the hospital bed. CSW made oxygen  referral to [facility] with [employer], which was accepted. [employer] to deliver travel tank to patient's room and the home set up will be completed after discharge. CSW notified Cindie with [facility] of patient's discharge. Care management signing off.  Final next level of care: Home w Home Health Services Barriers to Discharge: Barriers Resolved  Patient Goals and CMS Choice Patient states their goals for this hospitalization and ongoing recovery are:: To return home with son and daughter in law CMS Medicare.gov Compare Post Acute Care list provided to:: Patient Represenative (must comment) Choice offered to / list presented to : Patient, Adult Children [place] ownership interest in [facility].provided to:: Adult Children   Discharge Plan and Services Additional resources added to the After Visit Summary for   In-house Referral: Clinical Social Work Discharge Planning Services: CM Consult Post Acute Care Choice: Durable Medical Equipment, Home Health          DME Arranged: Oxygen  DME Agency: [employer] Date DME Agency Contacted: 03/08/24 Time DME Agency Contacted: [phone] Representative spoke with at DME Agency: [facility] HH Arranged: RN HH Agency: [facility] Date [facility] Agency Contacted: 03/03/24 Time HH Agency Contacted: [phone] Representative spoke with at [facility] Agency: Cindie  Social Drivers of Health (SDOH) Interventions SDOH Screenings   Food Insecurity: No Food Insecurity (03/02/2024)  Housing: Low Risk  (03/02/2024)  Transportation Needs: No Transportation Needs (03/02/2024)   Utilities: Not At Risk (03/02/2024)  Alcohol Screen: Low Risk  (08/18/2023)  Financial Resource Strain: Low Risk  (08/18/2023)  Social Connections: Moderately Integrated (03/02/2024)  Tobacco Use: Medium Risk (02/28/2024)   Readmission Risk Interventions    03/01/2024    3:14 PM 01/01/2024    1:00 PM 04/02/2023    3:52 PM  Readmission Risk Prevention Plan  Transportation Screening Complete Complete Complete  PCP or Specialist Appt within 5-7 Days   Complete  PCP or Specialist Appt within 3-5 Days  Complete   Home Care Screening   Complete  Medication Review (RN CM)   Complete  HRI or Home Care Consult  Complete   Social Work Consult for Recovery Care Planning/Counseling  Complete   Palliative Care Screening  Not Applicable   Medication Review Oceanographer)  Complete   PCP or Specialist appointment within 3-5 days of discharge Complete    HRI or Home Care Consult Complete    SW Recovery Care/Counseling Consult Complete    Palliative Care Screening Not Applicable    Skilled Nursing Facility Complete

## 2024-03-08 NOTE — Progress Notes (Addendum)
 SATURATION QUALIFICATIONS: (This note is used to comply with regulatory documentation for home oxygen )  Patient Saturations on Room Air at Rest = 92%  Patient Saturations on Room Air while Ambulating = 87%  Patient Saturations on 2 Liters of oxygen  while Ambulating = 93%  Please briefly explain why patient needs home oxygen : Pt's oxygen  desaturates while ambulating.  Agree with above evaluation

## 2024-03-10 ENCOUNTER — Other Ambulatory Visit: Payer: Self-pay | Admitting: *Deleted

## 2024-03-10 DIAGNOSIS — I721 Aneurysm of artery of upper extremity: Secondary | ICD-10-CM

## 2024-03-15 ENCOUNTER — Ambulatory Visit: Admitting: Internal Medicine

## 2024-03-15 ENCOUNTER — Other Ambulatory Visit: Payer: Self-pay

## 2024-03-15 VITALS — BP 102/51 | HR 86 | Temp 97.6°F | Resp 16

## 2024-03-15 DIAGNOSIS — R300 Dysuria: Secondary | ICD-10-CM | POA: Diagnosis not present

## 2024-03-15 NOTE — Progress Notes (Signed)
 Patient Active Problem List   Diagnosis Date Noted   Left upper extremity swelling 03/06/2024   Aspiration pneumonia (HCC) 03/02/2024   Chronic atrial fibrillation (HCC) 03/02/2024   Aortic valve vegetation 03/01/2024   H/O endocarditis 03/01/2024   Acute encephalopathy 02/29/2024   Shock (HCC) 02/29/2024   Leukopenia 02/29/2024   Acute on chronic combined systolic and diastolic CHF (congestive heart failure) (HCC) 02/28/2024   Lethargy 01/03/2024   Bacteremia 01/02/2024   Severe sepsis (HCC) 01/02/2024   Dysphagia 01/01/2024   Aspiration pneumonia of both lungs (HCC) 01/01/2024   Bilateral pleural effusion 01/01/2024   Lactic acidosis 01/01/2024   Permanent atrial fibrillation (HCC) 01/01/2024   Pneumonia of both lungs due to infectious organism 12/31/2023   Pressure injury of skin 08/18/2023   Acute CHF (congestive heart failure) (HCC) 08/16/2023   Acute hypoxemic respiratory failure (HCC) 08/16/2023   CKD (chronic kidney disease) stage 3, GFR 30-59 ml/min (HCC) 08/16/2023   Atrial fibrillation with rapid ventricular response (HCC) 04/01/2023   Elevated LFTs 03/31/2023   Acute on chronic systolic CHF (congestive heart failure) (HCC) 03/29/2023   Acute renal failure superimposed on stage 3a chronic kidney disease (HCC) 03/11/2023   Hyponatremia 03/11/2023   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   Bilateral shoulder pain 09/10/2022   S/P right hip fracture 08/20/2022   Hypotension 05/25/2022   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   Elevated troponin 01/19/2022   Hypokalemia 01/19/2022   LBBB (left bundle branch block) 01/19/2022   Dehydration 10/10/2019   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   ALPRAZOLAM  (XANAX ) 1 MG TABLET    Take 1 mg by mouth 3 (three) times daily as needed for anxiety.   AMIODARONE  (PACERONE ) 200 MG TABLET    Take 0.5 tablets (100 mg total) by mouth daily.   DICLOFENAC  SODIUM (VOLTAREN ) 1 % GEL    Apply 2 g topically 4 (four) times daily as needed (pain).   ELIQUIS  5 MG TABS TABLET    Take 1 tablet (5 mg total) by mouth 2 (two) times daily.   FUROSEMIDE  (LASIX ) 40 MG TABLET    Take 1 tablet (40 mg total) by mouth daily.   LOPERAMIDE  (IMODIUM  A-D) 2 MG TABLET    Take 2 mg by mouth 4 (four) times daily as needed for diarrhea or loose stools.   METHIMAZOLE  (TAPAZOLE ) 5 MG TABLET    Take 0.5 tablets (2.5 mg total) by mouth daily.   METOPROLOL  SUCCINATE (TOPROL -XL) 100 MG 24 HR TABLET    Take 50 mg by mouth 2 (two) times daily. Take with or immediately following a meal.   OXYCODONE -ACETAMINOPHEN  (PERCOCET) 10-325 MG TABLET    Take 1 tablet by mouth every 4 (four) hours as needed for up to 7 days for pain.  Modified Medications   No medications on file  Discontinued Medications   No medications on file    Subjective:  73 year old female with past medical history of A-fib on Eliquis , hypertension, rheumatoid arthritis presents for hospital follow-up of Enterococcus hirae bacteremia and pneumonia secondary to unclear source, possibly GI translocation.  Patient initially presented following a fall on 6/16 and then presented with dyspnea shortness of breath.  Blood cultures grew 2/4 bottles Enterococcus.  CT showed diffuse bilateral bronchopneumonia, bilateral lower lobe consolidation, fluid or secretions in proximal thoracic esophagus.  GI was consulted recommended SLP.  Outpatient endoscopy.   TTE no vegetation-.  Discharged on ampicillin  ceftriaxone  since 6 weeks Today 9/2: Presents with POA who notes patient has been having some dysuria on and off.  No fever or chills..  She was hospitalized and then from 4 hypoxia and confusion with acute hypoxic respiratory failure.  Blood cultures also antibiotics on 8/17 no growth.  Patient completed 7 days of Zosyn  for probable pneumonia.   Review of Systems: Review of Systems  All other systems reviewed and are negative.   Past Medical History:  Diagnosis Date   Anxiety    Atrial fibrillation (HCC)    newly diagnosed 10/2010   Family history of adverse reaction to anesthesia    Sister's BP drops    Hip fracture (HCC) 01/18/2022   Hypertension    Rheumatoid arthritis(714.0)    Subclinical hyperthyroidism     Social History   Tobacco Use   Smoking status: Former    Current packs/day: 0.00    Average packs/day: 0.3 packs/day for 54.2 years (13.6 ttl pk-yrs)    Types: Cigarettes    Start date: 1    Quit date: 10/2019    Years since quitting: 4.4    Passive exposure: Past   Smokeless tobacco: Never  Vaping Use   Vaping status: Never Used  Substance Use Topics   Alcohol use: Not Currently    Alcohol/week: 0.0 standard drinks of alcohol   Drug use: No    Family History  Problem Relation Age of Onset   Heart attack Mother 28       pacemaker age 8   Hypothyroidism Mother    Cancer Father        colon and prostate   Hypothyroidism Sister    Cancer Sister    Diabetes Brother    CAD Brother 59   Cancer Brother        Renal    Allergies  Allergen Reactions   Clindamycin/Lincomycin Rash   Latex Rash   Plaquenil  [Hydroxychloroquine ] Other (See Comments)    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     Health Maintenance  Topic Date Due   Medicare Annual Wellness (AWV)  Never done   COVID-19 Vaccine (1) Never done   Hepatitis C Screening  Never done   DTaP/Tdap/Td (1 - Tdap) Never done   Pneumococcal Vaccine: 50+ Years (1 of  2 - PCV) Never done   Zoster Vaccines- Shingrix (1 of 2) Never done   Colonoscopy  Never done   MAMMOGRAM  06/27/2016   INFLUENZA VACCINE  02/12/2024   DEXA SCAN  Completed   HPV VACCINES  Aged Out   Meningococcal B Vaccine  Aged Out    Objective:  Vitals:   03/15/24 1106  BP: (!) 102/51  Pulse: 86  Resp: 16  Temp: 97.6 F (36.4 C)  TempSrc: Oral  SpO2: 98%   There is no height or weight on file to calculate BMI.  Physical Exam Constitutional:      Appearance: Normal appearance.  HENT:     Head: Normocephalic  and atraumatic.     Right Ear: Tympanic membrane normal.     Left Ear: Tympanic membrane normal.     Nose: Nose normal.     Mouth/Throat:     Mouth: Mucous membranes are moist.  Eyes:     Extraocular Movements: Extraocular movements intact.     Conjunctiva/sclera: Conjunctivae normal.     Pupils: Pupils are equal, round, and reactive to light.  Cardiovascular:     Rate and Rhythm: Normal rate and regular rhythm.     Heart sounds: No murmur heard.    No friction rub. No gallop.  Pulmonary:     Effort: Pulmonary effort is normal.     Breath sounds: Normal breath sounds.  Abdominal:     General: Abdomen is flat.     Palpations: Abdomen is soft.  Musculoskeletal:        General: Normal range of motion.  Skin:    General: Skin is warm and dry.  Neurological:     General: No focal deficit present.     Mental Status: She is alert and oriented to person, place, and time.  Psychiatric:        Mood and Affect: Mood normal.    Physical Exam   Lab Results Lab Results  Component Value Date   WBC 5.1 03/08/2024   HGB 8.8 (L) 03/08/2024   HCT 28.2 (L) 03/08/2024   MCV 88.1 03/08/2024   PLT 125 (L) 03/08/2024    Lab Results  Component Value Date   CREATININE 1.66 (H) 03/08/2024   BUN 38 (H) 03/08/2024   NA 129 (L) 03/08/2024   K 4.9 03/08/2024   CL 84 (L) 03/08/2024   CO2 33 (H) 03/08/2024    Lab Results  Component Value Date   ALT 12 03/05/2024    AST 28 03/05/2024   ALKPHOS 101 03/05/2024   BILITOT 1.7 (H) 03/05/2024    Lab Results  Component Value Date   LDLDIRECT 117 (H) 09/20/2008   No results found for: LABRPR, RPRTITER No results found for: HIV1RNAQUANT, HIV1RNAVL, CD4TABS   Problem List Items Addressed This Visit   None  Results   Assessment/Plan #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  CT head was negative.  CT chest showed diffuse bilateral bronchopneumonia, bilateral lower lobe consolidation.  Also showed fluid or secretions in proximal thoracic esophagus GI consulted, rule recommend SLP and outpatient EGD.  Patient has certain shortness of breath, prior to that she noted she had diarrhea.  This could have led to bacteremia with endocarditis of to emboli to lungs versus pneumonia leading to bacteremia.  PE, treated as needed given history of thoracic/neck radiation.  Patient empirically treated with ampicillin  and ceftriaxone  x 6 weeks for endocarditis.  EOT 02/13/2024. -Patient was hospitalized since being seen in clinic for acute respiratory failure 2/2 suspected PNA.  ID was engaged given TTE findings of questionable vegetation in the AV versus Lambl's excesses.  Blood cultures on 8/17 off of antibiotics were with no growth.  As such  not consistent with endocarditis. - No further antibiotics needed.   #Intermittent dysuria -dysria noted by POA since discharge. No fevers and hcills. Witll get UA, avoid empericlaly treating as pt is comfortable and unable to provid a good history.  -f/u prn  Loney Stank, MD [facility] 03/15/2024, 11:20 AM   I  have personally spent 42 minutes involved in face-to-face and non-face-to-face activities for this patient on the day of the visit. Professional time spent includes the  following activities: Preparing to see the patient (review of tests), Obtaining and/or reviewing separately obtained history (admission/discharge record), Performing a medically appropriate examination and/or evaluation , Ordering medications/tests/procedures, referring and communicating with other health care professionals, Documenting clinical information in the EMR, Independently interpreting results (not separately reported), Communicating results to the patient/family/caregiver, Counseling and educating the patient/family/caregiver and Care coordination (not separately reported).

## 2024-03-16 LAB — URINALYSIS, ROUTINE W REFLEX MICROSCOPIC
Bacteria, UA: NONE SEEN /HPF
Bilirubin Urine: NEGATIVE
Glucose, UA: NEGATIVE
Hgb urine dipstick: NEGATIVE
Hyaline Cast: NONE SEEN /LPF
Ketones, ur: NEGATIVE
Leukocytes,Ua: NEGATIVE
Nitrite: NEGATIVE
RBC / HPF: NONE SEEN /HPF (ref 0–2)
Specific Gravity, Urine: 1.014 (ref 1.001–1.035)
Squamous Epithelial / HPF: NONE SEEN /HPF (ref <=5)
WBC, UA: NONE SEEN /HPF (ref 0–5)
pH: 7 (ref 5.0–8.0)

## 2024-03-16 LAB — MICROSCOPIC MESSAGE

## 2024-03-16 NOTE — Telephone Encounter (Signed)
 [facility] returned call and was given verbal orders per provider and was given instructions regarding the UTI and urinary symptoms to go to urgent care or ED. [facility] nurse stated that she will call patients daughter in law and inform her.

## 2024-03-17 NOTE — Progress Notes (Deleted)
 Patient ID: Rhonda Marshall, female   DOB: 23-Jun-1951, 73 y.o.   MRN: [id number]  Reason for Consult: No chief complaint on file.   Referred by [name] LABOR, MD  Subjective:     HPI Rhonda Marshall is a 73 y.o. female presenting for follow-up of a questionable left radial artery pseudoaneurysm.  She was recently admitted at [facility] with acute on chronic heart failure and hypoxemic respiratory failure.  This radial artery injury was presumed to be from an A-line.  At that time it appeared to be either hematoma versus a thrombosed pseudoaneurysm on duplex ultrasound. ***  Past Medical History:  Diagnosis Date   Anxiety    Atrial fibrillation (HCC)    newly diagnosed 10/2010   Family history of adverse reaction to anesthesia    Sister's BP drops    Hip fracture (HCC) 01/18/2022   Hypertension    Rheumatoid arthritis(714.0)    Subclinical hyperthyroidism    Family History  Problem Relation Age of Onset   Heart attack Mother 46       pacemaker age 109   Hypothyroidism Mother    Cancer Father        colon and prostate   Hypothyroidism Sister    Cancer Sister    Diabetes Brother    CAD Brother 30   Cancer Brother        Renal   Past Surgical History:  Procedure Laterality Date   Arm surgery     fracture, right arm   CARDIOVERSION N/A 08/10/2014   Procedure: CARDIOVERSION;  Surgeon: Leim VEAR Moose, MD;  Location: [facility] ENDOSCOPY;  Service: Cardiovascular;  Laterality: N/A;   CARDIOVERSION N/A 01/22/2022   Procedure: CARDIOVERSION;  Surgeon: Hobart Powell BRAVO, MD;  Location: [facility] ENDOSCOPY;  Service: Cardiovascular;  Laterality: N/A;   CATARACT EXTRACTION     DIRECT LARYNGOSCOPY N/A 09/02/2019   Procedure: DIRECT LARYNGOSCOPY WITH BIOPSY;  Surgeon: Ethyl Lonni BRAVO, MD;  Location: [facility];  Service: ENT;  Laterality: N/A;   HIP ARTHROPLASTY Right 01/19/2022   Procedure: ARTHROPLASTY BIPOLAR HIP (HEMIARTHROPLASTY);  Surgeon: Josefina Chew, MD;   Location: [facility] ORS;  Service: Orthopedics;  Laterality: Right;   IR GASTROSTOMY TUBE MOD SED  10/17/2019   IR GASTROSTOMY TUBE REMOVAL  01/03/2020   TEE WITHOUT CARDIOVERSION N/A 01/22/2022   Procedure: TRANSESOPHAGEAL ECHOCARDIOGRAM (TEE);  Surgeon: Hobart Powell BRAVO, MD;  Location[facility]COPY;  Service: Cardiovascular;  Laterality: N/A;   TUBAL LIGATION      Short Social History:  Social History   Tobacco Use   Smoking status: Former    Current packs/day: 0.00    Average packs/day: 0.3 packs/day for 54.2 years (13.6 ttl pk-yrs)    Types: Cigarettes    Start date: 30    Quit date: 10/2019    Years since quitting: 4.4    Passive exposure: Past   Smokeless tobacco: Never  Substance Use Topics   Alcohol use: Not Currently    Alcohol/week: 0.0 standard drinks of alcohol    Allergies  Allergen Reactions   Clindamycin/Lincomycin Rash   Latex Rash   Plaquenil  [Hydroxychloroquine ] Other (See Comments)    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     ALPRAZolam  (XANAX ) 1 MG tablet Take 1 mg by mouth 3 (three) times  daily as needed for anxiety.     amiodarone  (PACERONE ) 200 MG tablet Take 0.5 tablets (100 mg total) by mouth daily. 45 tablet 3   diclofenac  Sodium (VOLTAREN ) 1 % GEL Apply 2 g topically 4 (four) times daily as needed (pain).     ELIQUIS  5 MG TABS tablet Take 1 tablet (5 mg total) by mouth 2 (two) times daily. 180 tablet 1   furosemide  (LASIX ) 40 MG tablet Take 1 tablet (40 mg total) by mouth daily. 30 tablet 11   loperamide  (IMODIUM  A-D) 2 MG tablet Take 2 mg by mouth 4 (four) times daily as needed for diarrhea or loose stools.     methimazole  (TAPAZOLE ) 5 MG tablet Take 0.5 tablets (2.5 mg total) by mouth daily. 45 tablet 3   metoprolol  succinate (TOPROL -XL) 100 MG 24 hr tablet Take 50 mg by  mouth 2 (two) times daily. Take with or immediately following a meal.     No current facility-administered medications for this visit.    REVIEW OF SYSTEMS  All other systems were reviewed and are negative     Objective:  Objective   There were no vitals filed for this visit. There is no height or weight on file to calculate BMI.  Physical Exam General: no acute distress Cardiac: hemodynamically stable Pulm: normal work of breathing Abdomen: non-tender, no pulsatile mass*** Neuro: alert, no focal deficit Extremities: no edema, cyanosis or wounds*** Vascular:   Right: ***  Left: ***  Data: Arterial duplex ***     Assessment/Plan:   Mariame Rybolt is a 73 y.o. female with ***  Recommendations to optimize cardiovascular risk: Abstinence from all tobacco products. Blood glucose control with goal A1c < 7%. Blood pressure control with goal blood pressure < 140/90 mmHg. Lipid reduction therapy with goal LDL-C <100 mg/dL  Aspirin 81mg  PO QD.  Atorvastatin 40-80mg  PO QD (or other high intensity statin therapy).   Norman GORMAN Serve MD [facility]

## 2024-03-18 ENCOUNTER — Ambulatory Visit (HOSPITAL_COMMUNITY): Attending: Family Medicine

## 2024-03-18 ENCOUNTER — Ambulatory Visit (HOSPITAL_COMMUNITY)
Admission: RE | Admit: 2024-03-18 | Discharge: 2024-03-18 | Disposition: A | Discharge status: Home / Self Care | Source: Ambulatory Visit | Attending: Vascular Surgery | Admitting: Vascular Surgery

## 2024-03-18 ENCOUNTER — Ambulatory Visit: Attending: Vascular Surgery | Admitting: Vascular Surgery

## 2024-03-18 DIAGNOSIS — I721 Aneurysm of artery of upper extremity: Secondary | ICD-10-CM | POA: Insufficient documentation

## 2024-03-21 ENCOUNTER — Ambulatory Visit: Payer: Self-pay | Admitting: Internal Medicine

## 2024-03-21 ENCOUNTER — Ambulatory Visit: Admitting: Endocrinology

## 2024-03-21 ENCOUNTER — Encounter: Payer: Self-pay | Admitting: Endocrinology

## 2024-03-21 VITALS — BP 100/70 | HR 105 | Resp 20 | Ht 62.0 in | Wt 126.0 lb

## 2024-03-21 DIAGNOSIS — E0520 Thyrotoxicosis with toxic multinodular goiter without thyrotoxic crisis or storm: Secondary | ICD-10-CM

## 2024-03-21 DIAGNOSIS — E0590 Thyrotoxicosis, unspecified without thyrotoxic crisis or storm: Secondary | ICD-10-CM | POA: Diagnosis not present

## 2024-03-21 DIAGNOSIS — E041 Nontoxic single thyroid nodule: Secondary | ICD-10-CM | POA: Diagnosis not present

## 2024-03-21 LAB — TSH: TSH: 1.05 m[IU]/L (ref 0.40–4.50)

## 2024-03-21 LAB — T4, FREE: Free T4: 1.6 ng/dL (ref 0.8–1.8)

## 2024-03-21 LAB — T3, FREE: T3, Free: 3.4 pg/mL (ref 2.3–4.2)

## 2024-03-21 NOTE — Progress Notes (Signed)
 Outpatient Endocrinology Note [place] Tirso Laws, MD   Patient's Name: Rhonda Marshall    DOB: 09/07/1950    MRN: [id number]  REASON OF VISIT: Follow-up for hyperthyroidism  PCP: [name] LABOR, MD  HISTORY OF PRESENT ILLNESS:   Rhonda Marshall is a 73 y.o. old female with past medical history as listed below is presented for a follow up for hyperthyroidism due to hyperfunctioning thyroid  nodule.   Pertinent Thyroid  History: Patient was previously seen by Dr. Von and was last time seen in August 2024.  Review of her records for thyroid  history indicates that she reportedly had subclinical hyperthyroidism in 2012 when she was admitted for new onset atrial fibrillation. Subsequently did not have any treatment or further evaluation. She has been treated for atrial fibrillation by her cardiologist and was recommended evaluation for the thyroid . The patient is a poor historian and on her initial visit she did not give any symptoms of palpitations, shakiness, feeling excessively warm and sweaty or weight loss.  She did complain of nervousness and fatigue.  However she has also had issues with anxiety and depression   Since her baseline TSH was very low along with upper normal free T3 she has been on PTU 50 mg twice a day from October 2017. Baseline thyrotropin receptor antibody was negative. With starting treatment she had less fatigue symptoms.  In August 2018 patient had radioactive iodine  uptake and scan 24-hour uptake was 31% with toxic left thyroid  adenoma probable additional hyperfunctioning nodule on the left upper pole and suppression of uptake within remaining thyroid  tissue.   She was scheduled for I-131 treatment because of the finding of a large hot nodule on the left side. However she has not been able to do this since she cannot be in isolation for a couple of days at home.  In 2018 she was off of antithyroid medication.  Free T3 got elevated 4.3.  She was treated with PTU 50 mg up  to 1 to 3 tablets/day.  In April 2024 patient had, mildly low TSH with normal free T4 and T3 T, PTU was changed to methimazole  5 mg daily.    Other: She has chronic atrial fibrillation followed by cardiologist.  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  Patient had lab work on August 18 with elevated TSH of 11.667.  She was at that time was taking methimazole  2.5 mg daily and was stopped.  She is not on thyroid  medication at this time.  She denies palpitation and heat intolerance.  She thinks her leg edema is slowly getting better.  She has no other complaints today.   Latest Reference Range & Units 02/29/24 04:19  TSH 0.350 - 4.500 uIU/mL 11.667 (H)  T4,Free(Direct) 0.61 - 1.12 ng/dL 9.13  (H): Data is abnormally high   REVIEW OF SYSTEMS:  As per history of present illness.   PAST MEDICAL HISTORY: Past Medical History:  Diagnosis Date   Anxiety    Atrial fibrillation (HCC)    newly diagnosed 10/2010   Family history of adverse reaction to anesthesia    Sister's BP drops    Hip fracture (HCC) 01/18/2022   Hypertension    Rheumatoid arthritis(714.0)    Subclinical hyperthyroidism     PAST SURGICAL HISTORY: Past Surgical History:  Procedure Laterality Date   Arm surgery     fracture, right arm   CARDIOVERSION N/A 08/10/2014  Procedure: CARDIOVERSION;  Surgeon: Leim VEAR Moose, MD;  Location: [facility] ENDOSCOPY;  Service: Cardiovascular;  Laterality: N/A;   CARDIOVERSION N/A 01/22/2022   Procedure: CARDIOVERSION;  Surgeon: Hobart Powell BRAVO, MD;  Location: [facility] ENDOSCOPY;  Service: Cardiovascular;  Laterality: N/A;   CATARACT EXTRACTION     DIRECT LARYNGOSCOPY N/A 09/02/2019   Procedure: DIRECT LARYNGOSCOPY WITH BIOPSY;  Surgeon: Ethyl Lonni BRAVO, MD;  Location: [facility];  Service: ENT;  Laterality: N/A;   HIP ARTHROPLASTY Right 01/19/2022   Procedure:  ARTHROPLASTY BIPOLAR HIP (HEMIARTHROPLASTY);  Surgeon: Josefina Chew, MD;  Location: [facility] ORS;  Service: Orthopedics;  Laterality: Right;   IR GASTROSTOMY TUBE MOD SED  10/17/2019   IR GASTROSTOMY TUBE REMOVAL  01/03/2020   TEE WITHOUT CARDIOVERSION N/A 01/22/2022   Procedure: TRANSESOPHAGEAL ECHOCARDIOGRAM (TEE);  Surgeon: Hobart Powell BRAVO, MD;  Location: [facility] ENDOSCOPY;  Service: Cardiovascular;  Laterality: N/A;   TUBAL LIGATION      ALLERGIES: Allergies  Allergen Reactions   Clindamycin/Lincomycin Rash   Latex Rash   Plaquenil  [Hydroxychloroquine ] Other (See Comments)    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     FAMILY HISTORY:  Family History  Problem Relation Age of Onset   Heart attack Mother 9       pacemaker age 53   Hypothyroidism Mother    Cancer Father        colon and prostate   Hypothyroidism Sister    Cancer Sister    Diabetes Brother    CAD Brother 21   Cancer Brother        Renal    SOCIAL HISTORY: Social History   Socioeconomic History   Marital status: Widowed    Spouse name: Not on file   Number of children: 1   Years of education: Not on file   Highest education level: Not on file  Occupational History   Occupation: laborer    Comment: [employer]   Occupation: retired  Tobacco Use   Smoking status: Former    Current packs/day: 0.00    Average packs/day: 0.3 packs/day for 54.2 years (13.6 ttl pk-yrs)    Types: Cigarettes    Start date: 64    Quit date: 10/2019    Years since quitting: 4.4    Passive exposure: Past   Smokeless tobacco: Never  Vaping Use   Vaping status: Never Used  Substance and Sexual Activity   Alcohol use: Not Currently    Alcohol/week: 0.0 standard drinks of alcohol   Drug use: No   Sexual activity: Not on file  Other Topics Concern   Not on file  Social History Narrative   Not on file   Social Drivers of Health   Financial Resource Strain: Low Risk  (08/18/2023)   Overall Financial Resource Strain (CARDIA)     Difficulty of Paying Living Expenses: Not very hard  Food Insecurity: No Food Insecurity (03/02/2024)   Hunger Vital Sign    Worried About Running Out of Food in the Last Year: Never true    Ran Out of Food in the Last Year: Never true  Transportation Needs: No Transportation Needs (03/02/2024)   PRAPARE - Administrator, Civil Service (Medical): No    Lack of Transportation (Non-Medical): No  Physical Activity: Not on file  Stress: Not on file  Social Connections: Moderately Integrated (03/02/2024)   Social Connection and Isolation Panel    Frequency of Communication with Friends and Family: Three times a week    Frequency  of Social Gatherings with Friends and Family: More than three times a week    Attends Religious Services: 1 to 4 times per year    Active Member of [employer] or Organizations: No    Attends Banker Meetings: 1 to 4 times per year    Marital Status: Widowed    MEDICATIONS:  Current Outpatient Medications  Medication Sig Dispense Refill   acetaminophen  (TYLENOL ) 500 MG tablet Take 500 mg by mouth every 4 (four) hours as needed for moderate pain (pain score 4-6).     ALPRAZolam  (XANAX ) 1 MG tablet Take 1 mg by mouth 3 (three) times daily as needed for anxiety.     amiodarone  (PACERONE ) 200 MG tablet Take 0.5 tablets (100 mg total) by mouth daily. 45 tablet 3   diclofenac  Sodium (VOLTAREN ) 1 % GEL Apply 2 g topically 4 (four) times daily as needed (pain).     ELIQUIS  5 MG TABS tablet Take 1 tablet (5 mg total) by mouth 2 (two) times daily. 180 tablet 1   furosemide  (LASIX ) 40 MG tablet Take 1 tablet (40 mg total) by mouth daily. 30 tablet 11   loperamide  (IMODIUM  A-D) 2 MG tablet Take 2 mg by mouth 4 (four) times daily as needed for diarrhea or loose stools.     methimazole  (TAPAZOLE ) 5 MG tablet Take 0.5 tablets (2.5 mg total) by mouth daily. 45 tablet 3   metoprolol  succinate (TOPROL -XL) 100 MG 24 hr tablet Take 50 mg by mouth 2 (two) times daily.  Take with or immediately following a meal.     No current facility-administered medications for this visit.    PHYSICAL EXAM: Vitals:   03/21/24 1615  BP: 100/70  Pulse: (!) 105  Resp: 20  SpO2: 97%  Weight: 126 lb (57.2 kg)  Height: 5' 2 (1.575 m)   Body mass index is 23.05 kg/m.  Wt Readings from Last 3 Encounters:  03/21/24 126 lb (57.2 kg)  03/08/24 119 lb 14.9 oz (54.4 kg)  01/08/24 117 lb 4.6 oz (53.2 kg)    General: Well developed, well nourished female in no apparent distress.  HEENT: AT/[place], no external lesions. Hearing intact to the spoken word Eyes: Conjunctiva clear and no icterus. Neck: Trachea midline, neck supple without appreciable thyromegaly or lymphadenopathy and left palpable thyroid  nodule + Abdomen: Soft, non tender, non distended Neurologic: Alert, oriented, normal speech, deep tendon biceps reflexes normal,  no gross focal neurological deficit Extremities: + pedal pitting edema, no tremors of outstretched hands Skin: Warm, color good.  Psychiatric: Does not appear depressed or anxious  PERTINENT HISTORIC LABORATORY AND IMAGING STUDIES:  All pertinent laboratory results were reviewed. Please see HPI also for further details.   TSH  Date Value Ref Range Status  02/29/2024 11.667 (H) 0.350 - 4.500 uIU/mL Final    Comment:    Performed by a 3rd Generation assay with a functional sensitivity of <=0.01 uIU/mL. Performed at [facility], [address], [place], [place] [address]   01/02/2024 2.939 0.350 - 4.500 uIU/mL Final    Comment:    Performed by a 3rd Generation assay with a functional sensitivity of <=0.01 uIU/mL. Performed at [facility], [address] [place], [place] [address]   11/19/2023 8.09 (H) 0.40 - 4.50 mIU/L Final   T3, Total  Date Value Ref Range Status  01/02/2024 60 (L) 71 - 180 ng/dL Final    Comment:    (NOTE) Performed At: [facility] [address] [place], [place]  [id number] Jennette Shorter MD Ey:[id number]   01/18/2022 112 71 - 180 ng/dL Final    Comment:    (NOTE) Performed At: [facility] [address] [place], [place] [id number] Jennette Shorter MD Ey:[id number]   12/20/2018 158 71 - 180 ng/dL Final    Lab Results  Component Value Date   FREET4 0.86 02/29/2024   FREET4 0.93 01/02/2024   FREET4 1.3 11/19/2023   T3FREE 3.3 11/19/2023   T3FREE 2.7 07/16/2023   T3FREE 3.6 03/10/2023   TSH 11.667 (H) 02/29/2024   TSH 2.939 01/02/2024   TSH 8.09 (H) 11/19/2023    Lab Results  Component Value Date   THYROTRECAB <0.50 12/24/2016    Lab Results  Component Value Date   TSH 11.667 (H) 02/29/2024   TSH 2.939 01/02/2024   TSH 8.09 (H) 11/19/2023   FREET4 0.86 02/29/2024   FREET4 0.93 01/02/2024   FREET4 1.3 11/19/2023     No results found for: TSI   No components found for: TRAB    ASSESSMENT / PLAN  1. Hyperthyroidism   2. Toxic nodular goiter   3. Hot thyroid  nodule     -Patient has hyperthyroidism secondary to hyperfunctioning left thyroid  nodule.  She was treated initially with propylthiouracil .  She has been on methimazole  since April 2024.  Until recently she was taking methimazole  2.5 mg daily.  During hospitalization she had TSH of 11 on August 18.  She has history of persistent atrial fibrillation as well.  She is unwilling to go for RAI I-131 treatment for hyperthyroidism, because she cannot go for isolation at home.  She is currently on amiodarone  as well.  Will treat hyperthyroidism with antithyroid medication.  She is currently not on methimazole  he stopped on August 18.  Discussed that thyroid  function test can be abnormal related to critical illness especially when hospitalized.   Plan: - Check thyroid  function test today decide on restarting methimazole .  Diagnoses and all orders for this visit:  Hyperthyroidism -     T4, free -     T3, free -     TSH  Toxic nodular goiter  Hot thyroid   nodule -     T4, free -     T3, free -     TSH   DISPOSITION Follow up in clinic in 3 months suggested.  All questions answered and patient verbalized understanding of the plan.  [place]ra[place] Deno Sida, MD [facility] Endocrinology [facility] [address] [place], [address] [place], [place] [address] Phone # [phone]   At least part of this note was generated using voice recognition software. Inadvertent word errors may have occurred, which were not recognized during the proofreading process.

## 2024-03-23 ENCOUNTER — Ambulatory Visit: Payer: Self-pay | Admitting: Endocrinology

## 2024-03-23 DIAGNOSIS — E0590 Thyrotoxicosis, unspecified without thyrotoxic crisis or storm: Secondary | ICD-10-CM

## 2024-04-01 ENCOUNTER — Ambulatory Visit: Attending: Vascular Surgery | Admitting: Vascular Surgery

## 2024-04-01 ENCOUNTER — Encounter: Payer: Self-pay | Admitting: Vascular Surgery

## 2024-04-01 VITALS — BP 100/71 | HR 105 | Temp 98.2°F | Resp 18 | Ht 62.0 in | Wt 126.0 lb

## 2024-04-01 DIAGNOSIS — I721 Aneurysm of artery of upper extremity: Secondary | ICD-10-CM | POA: Diagnosis not present

## 2024-04-01 NOTE — Progress Notes (Signed)
 Office Note     CC: Evaluation of left radial artery pseudoaneurysm Requesting Provider:  [name] LABOR, MD  HPI: Rhonda Marshall is a 73 y.o. (06-11-1951) female presenting in follow-up after being seen in the hospital with left radial artery pseudoaneurysm.  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  She initially needed BiPAP and was admitted to stepdown unit.  Her symptoms were mostly related to chronic combined heart failure exacerbation, also treated for superadded sepsis.  There was concern for possible endocarditis, which was ruled out.  Fortunately, she was discharged home to family.  There was some concern for right upper extremity DVT during her stay.  This demonstrated small pseudoaneurysm in the right radial artery.  This is from a prior cardiac cath at another hospital.  There was still a small amount of flow in the neck, therefore plans were made for follow-up.  On exam today, she was accompanied by her daughter.  She had no concerns.  Denies sensorimotor deficits in the left hand other than normal symptoms from her rheumatoid arthritis.  Past Medical History:  Diagnosis Date   Anxiety    Atrial fibrillation (HCC)    newly diagnosed 10/2010   Family history of adverse reaction to anesthesia    Sister's BP drops    Hip fracture (HCC) 01/18/2022   Hypertension    Rheumatoid arthritis(714.0)    Subclinical hyperthyroidism     Past Surgical History:  Procedure Laterality Date   Arm surgery     fracture, right arm   CARDIOVERSION N/A 08/10/2014   Procedure: CARDIOVERSION;  Surgeon: Leim VEAR Moose, MD;  Location: [facility] ENDOSCOPY;  Service: Cardiovascular;  Laterality: N/A;   CARDIOVERSION N/A 01/22/2022   Procedure: CARDIOVERSION;   Surgeon: Hobart Powell BRAVO, MD;  Location: [facility] ENDOSCOPY;  Service: Cardiovascular;  Laterality: N/A;   CATARACT EXTRACTION     DIRECT LARYNGOSCOPY N/A 09/02/2019   Procedure: DIRECT LARYNGOSCOPY WITH BIOPSY;  Surgeon: Ethyl Lonni BRAVO, MD;  Location: [facility];  Service: ENT;  Laterality: N/A;   HIP ARTHROPLASTY Right 01/19/2022   Procedure: ARTHROPLASTY BIPOLAR HIP (HEMIARTHROPLASTY);  Surgeon: Josefina Chew, MD;  Location: [facility];  Service: Orthopedics;  Laterality: Right;   IR GASTROSTOMY TUBE MOD SED  10/17/2019   IR GASTROSTOMY TUBE REMOVAL  01/03/2020   TEE WITHOUT CARDIOVERSION N/A 01/22/2022   Procedure: TRANSESOPHAGEAL ECHOCARDIOGRAM (TEE);  Surgeon: Hobart Powell BRAVO, MD;  Location: [facility] ENDOSCOPY;  Service: Cardiovascular;  Laterality: N/A;   TUBAL LIGATION      Social History   Socioeconomic History   Marital status: Widowed    Spouse name: Not on file   Number of children: 1   Years of education: Not on file   Highest education level: Not on file  Occupational History   Occupation: laborer    Comment: [employer]   Occupation: retired  Tobacco Use   Smoking status: Former    Current packs/day: 0.00    Average packs/day: 0.3 packs/day for 54.2 years (13.6 ttl pk-yrs)    Types: Cigarettes    Start date: 65    Quit date: 10/2019    Years since quitting: 4.4    Passive exposure: Past   Smokeless tobacco: Never  Vaping Use   Vaping status: Never Used  Substance and Sexual Activity  Alcohol use: Not Currently    Alcohol/week: 0.0 standard drinks of alcohol   Drug use: No   Sexual activity: Not on file  Other Topics Concern   Not on file  Social History Narrative   Not on file   Social Drivers of Health   Financial Resource Strain: Low Risk  (08/18/2023)   Overall Financial Resource Strain (CARDIA)    Difficulty of Paying Living Expenses: Not very hard  Food Insecurity: No Food Insecurity (03/02/2024)   Hunger Vital Sign    Worried  About Running Out of Food in the Last Year: Never true    Ran Out of Food in the Last Year: Never true  Transportation Needs: No Transportation Needs (03/02/2024)   PRAPARE - Administrator, Civil Service (Medical): No    Lack of Transportation (Non-Medical): No  Physical Activity: Not on file  Stress: Not on file  Social Connections: Moderately Integrated (03/02/2024)   Social Connection and Isolation Panel    Frequency of Communication with Friends and Family: Three times a week    Frequency of Social Gatherings with Friends and Family: More than three times a week    Attends Religious Services: 1 to 4 times per year    Active Member of Clubs or Organizations: No    Attends Banker Meetings: 1 to 4 times per year    Marital Status: Widowed  Intimate Partner Violence: Not At Risk (03/02/2024)   Humiliation, Afraid, Rape, and Kick questionnaire    Fear of Current or Ex-Partner: No    Emotionally Abused: No    Physically Abused: No    Sexually Abused: No   Family History  Problem Relation Age of Onset   Heart attack Mother 22       pacemaker age 54   Hypothyroidism Mother    Cancer Father        colon and prostate   Hypothyroidism Sister    Cancer Sister    Diabetes Brother    CAD Brother 87   Cancer Brother        Renal    Current Outpatient Medications  Medication Sig Dispense Refill   acetaminophen  (TYLENOL ) 500 MG tablet Take 500 mg by mouth every 4 (four) hours as needed for moderate pain (pain score 4-6).     ALPRAZolam  (XANAX ) 1 MG tablet Take 1 mg by mouth 3 (three) times daily as needed for anxiety.     amiodarone  (PACERONE ) 200 MG tablet Take 0.5 tablets (100 mg total) by mouth daily. 45 tablet 3   diclofenac  Sodium (VOLTAREN ) 1 % GEL Apply 2 g topically 4 (four) times daily as needed (pain).     ELIQUIS  5 MG TABS tablet Take 1 tablet (5 mg total) by mouth 2 (two) times daily. 180 tablet 1   furosemide  (LASIX ) 40 MG tablet Take 1 tablet (40  mg total) by mouth daily. 30 tablet 11   loperamide  (IMODIUM  A-D) 2 MG tablet Take 2 mg by mouth 4 (four) times daily as needed for diarrhea or loose stools.     methimazole  (TAPAZOLE ) 5 MG tablet Take 0.5 tablets (2.5 mg total) by mouth daily. 45 tablet 3   metoprolol  succinate (TOPROL -XL) 100 MG 24 hr tablet Take 50 mg by mouth 2 (two) times daily. Take with or immediately following a meal.     No current facility-administered medications for this visit.    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      REVIEW OF SYSTEMS:  [X]  denotes positive finding, [ ]  denotes negative finding Cardiac  Comments:  Chest pain or chest pressure:    Shortness of breath upon exertion:    Short of breath when lying flat:    Irregular heart rhythm:        Vascular    Pain in calf, thigh, or hip brought on by ambulation:    Pain in feet at night that wakes you up from your sleep:     Blood clot in your veins:    Leg swelling:         Pulmonary    Oxygen  at home:    Productive cough:     Wheezing:         Neurologic    Sudden weakness in arms or legs:     Sudden numbness in arms or legs:     Sudden onset of difficulty speaking or slurred speech:    Temporary loss of vision in one eye:     Problems with dizziness:         Gastrointestinal    Blood in stool:     Vomited blood:         Genitourinary    Burning when urinating:     Blood in urine:        Psychiatric    Major depression:         Hematologic    Bleeding problems:    Problems with blood clotting too easily:        Skin    Rashes or ulcers:        Constitutional    Fever or chills:      PHYSICAL EXAMINATION:  There were no vitals filed for this visit.  General:  WDWN in NAD; vital signs documented above Gait: Not observed HENT: WNL, normocephalic Pulmonary: normal non-labored breathing , without  wheezing Cardiac: regular HR Abdomen: soft, NT, no masses Skin: without rashes Vascular Exam/Pulses:  Right Left  Radial 2+ (normal) 2+ (normal)                       Extremities: without ischemic changes, without Gangrene , without cellulitis; without open wounds;  Musculoskeletal: no muscle wasting or atrophy  Neurologic: A&O X 3;  No focal weakness or paresthesias are detected Psychiatric:  The pt has Normal affect.   Non-Invasive Vascular Imaging:   Thrombosed pseudoaneurysm    ASSESSMENT/PLAN: Rhonda Marshall is a 73 y.o. female presenting in follow-up after right radial partially thrombosed pseudoaneurysm appreciated.  On exam today, the aneurysm is completely thrombosed.  She does not need further follow-up at this time.    Fonda FORBES Rim, MD [facility] [phone]

## 2024-04-04 ENCOUNTER — Encounter: Payer: Self-pay | Admitting: Physician Assistant

## 2024-04-04 ENCOUNTER — Ambulatory Visit: Attending: Physician Assistant | Admitting: Physician Assistant

## 2024-04-04 VITALS — BP 112/75 | HR 101 | Ht 62.0 in | Wt 122.0 lb

## 2024-04-04 DIAGNOSIS — I5022 Chronic systolic (congestive) heart failure: Secondary | ICD-10-CM | POA: Diagnosis not present

## 2024-04-04 DIAGNOSIS — E871 Hypo-osmolality and hyponatremia: Secondary | ICD-10-CM

## 2024-04-04 DIAGNOSIS — I4819 Other persistent atrial fibrillation: Secondary | ICD-10-CM

## 2024-04-04 DIAGNOSIS — I4821 Permanent atrial fibrillation: Secondary | ICD-10-CM | POA: Diagnosis not present

## 2024-04-04 NOTE — Progress Notes (Unsigned)
 Cardiology Office Note   Date:  04/06/2024  ID:  Rhonda, Marshall 08/01/1950, MRN [id number] PCP: [name] LABOR, MD  [facility] Providers Cardiologist:  Lynwood Schilling, MD     History of Present Illness Rhonda Marshall is a 73 y.o. female with past medical history of HFrEF, permanent atrial fibrillation, CKD stage III and hypothyroidism.  Echocardiogram in 2018 showed EF 40%. Myoview  showed no ischemia.  Repeat echocardiogram in 2023 showed EF 30 to 35%.  Echocardiogram in September 2024 showed EF 30 to 35%, severe RAE, mild MR, normal RV.  Patient was admitted to [facility] in February 2025 with acute on chronic systolic heart failure and A-fib with RVR.  She required BiPAP on arrival.  Chest x-ray showed pulmonary edema with pleural effusion.  BNP elevated at 4500.  Serial troponin mildly elevated but remained flat.  EKG showed the A-fib with RVR with heart rate in the 140s.  She underwent IV diuresis.  A-fib was treated with rate control.  Digoxin  added and she was continued on amiodarone  and metoprolol .  Since discharge, she has been seen by heart failure TOC clinic in February 2025, repeat BNP was 369.9.  Creatinine was 1.08 which was stable.  She was continued on 40 mg daily of Lasix .  Heart rate was very well-controlled, amiodarone  reduced to 100 mg daily.  There was some concern of bradycardia with heart rate dipping down to the 40s.  Digoxin  level remained elevated on repeat trough, digoxin  was eventually discontinued.  She was seen at [facility] urgent [facility], chest x-ray showed possible pneumonia and she was treated with doxycycline .  Lasix  was reduced back to 20 mg daily due to hypotension.  She was seen by Thom Sluder PA-C on 09/23/2023, it was concerning that she was hyponatremic on the previous blood work.  Repeat basic metabolic panel however shows significant improvement in the sodium level.  She was given salt tablet.  Lasix  stopped.  Dry weight was felt to be  around 90 to 92 pounds.  I last saw the patient on 10/06/2023 at which time she was doing very well.  There was no bradycardia noted.  She has gained some weight however I suspected it is true weight gain as she was having better appetite.  Patient was readmitted to the hospital in June 2025 after presenting to the hospital with loss of balance and a fall.  CT of the head showed no acute finding.  O2 saturation 89% on room air.  Blood pressure was 140s.  BNP in the hospital was greater than 4500.  Blood culture grew Enterococcus [name].  CT of the soft tissue of the neck showed numerous nodular opacity throughout the visualized lung apices suspicious for pneumonia, aspiration or septic emboli.  Large right pleural effusion, debrides within esophagus placing the patient at risk for aspiration, mild diffuse subcutaneous edema.  Patient was admitted for sepsis, multifocal versus aspiration pneumonia.  Cardiology service consulted for acute on chronic systolic heart failure. She was placed PICC line with intention to run IV AV antibiotic for 6 weeks based on her ID recommendation.  She was discharged on 20 mg daily of furosemide , losartan  25 mg daily.  Unfortunately after the hospitalization, her renal function went down again with high potassium.  I stopped her potassium supplement and changed Lasix  to as needed.  She was readmitted to the hospital in August 2025 with heart failure. Echocardiogram obtained on 02/19/2024 showed EF 30 to 35%, septal lateral dyssynchrony suggestive of  left bundle branch block, RVSP 25 mmHg, severe LAE, small mobile density attached to the ventricular surface of the aortic valve.  She was felt to be volume overloaded with recurrent sepsis.  Blood culture was negative.  Echocardiogram was reviewed by Dr. Delford who felt that she did not have vegetation.  She had a lot of edema due to third spacing and extremely low albumin  of 1.7.  She was discharged on 40 mg twice a day of oral Lasix .   Sodium on discharge was 129.  Discharge dry weight was 128 pounds.  Vascular surgery service was consulted during the hospitalization due to radial artery pseudoaneurysm.  Repeat outpatient ultrasound shows the pseudoaneurysm has been thrombosed.  No further follow-up was felt to be needed.  Patient presents today for posthospital follow-up.  At the recommendation of [facility] nurse, family has initiated home hospice.  I do agree with his decision as the patient's overall condition is fairly poor.  She still has significant hyponatremia however family has tried to make her eat as much as possible and as frequently as possible.  On exam, she has 1-2+ pitting edema in bilateral lower extremity, I suspect that this is due to hypoalbuminemia rather than heart failure.  Although discharge note mentions she was supposed to be discharged on 40 mg twice a day of Lasix , however the medication list on discharge suggest she was discharged on 40 mg daily of Lasix .  I confirmed with family member, daughter says she has been receiving 40 mg daily of Lasix  instead of twice daily dosing.  Her weight is stable since discharge between 119 to 122 pounds.  This is actually 5 pounds lower than her discharge dry weight.  On exam, her heart rate is tachycardic, however EKG indicated her heart rate is borderline controlled to right around 100 still in atrial fibrillation.  She has at least mild to moderate degree of right pleural effusion based on my physical exam, she has diffuse crackles in the left base of the lung which I suspect is more related to atelectasis.  She is at risk for decompensation over the next few weeks.  I plan to bring the patient back in 4 weeks for reassessment.  I will obtain basic metabolic panel today to check her renal function and her sodium level.  ROS:   Patient complains of weakness, she has no chest pain.  She has shortness of breath with any strenuous activity.  She also has lower extremity  edema  Studies Reviewed EKG Interpretation Date/Time:  Monday April 04 2024 11:28:02 EDT Ventricular Rate:  101 PR Interval:    QRS Duration:  136 QT Interval:  384 QTC Calculation: 497 R Axis:   -84  Text Interpretation: Atrial fibrillation with rapid ventricular response Left axis deviation Non-specific intra-ventricular conduction block Cannot rule out Septal infarct , age undetermined When compared with ECG of 28-Feb-2024 17:56, PREVIOUS ECG IS PRESENT Confirmed by Janene Boer [phone]) on 04/06/2024 10:52:55 PM    Cardiac Studies & Procedures   ______________________________________________________________________________________________   STRESS TESTS  MYOCARDIAL PERFUSION IMAGING 04/29/2017  Interpretation Summary  This is a low risk study.  Low risk stress nuclear study with no ischemia or infarction; study not gated due to atrial fibrillation.   ECHOCARDIOGRAM  ECHOCARDIOGRAM LIMITED 02/29/2024  Narrative ECHOCARDIOGRAM LIMITED REPORT    Patient Name:   LEONDRA CULLIN Date of Exam: 02/29/2024 Medical Rec #:  [id number]        Height:       62.0  in Accession #:    [id number]       Weight:       136.0 lb Date of Birth:  13-Aug-1950        BSA:          1.623 m Patient Age:    73 years         BP:           73/34 mmHg Patient Gender: F                HR:           83 bpm. Exam Location:  Inpatient  Procedure: 2D Echo, Limited Echo, Cardiac Doppler, Color Doppler and Strain Analysis (Both Spectral and Color Flow Doppler were utilized during procedure).  Indications:     CHF  History:         Patient has prior history of Echocardiogram examinations, most recent 01/01/2024. CHF, Arrythmias:Atrial Fibrillation, Signs/Symptoms:Shortness of Breath and Edema; Risk Factors:Hypertension.  Sonographer:     Therisa Crouch Referring Phys:  [id number] JESSICA MARSHALL Diagnosing Phys: Ezra McleanMD  IMPRESSIONS   1. Left ventricular ejection fraction, by estimation,  is 30 to 35%. Left ventricular ejection fraction by 2D MOD biplane is 33.5 %. The left ventricle has moderately decreased function. The left ventricle demonstrates global hypokinesis with septal-lateral dyssynchrony suggestive of LBBB. There is mild concentric left ventricular hypertrophy. Left ventricular diastolic parameters are indeterminate. 2. Right ventricular systolic function is mildly reduced. The right ventricular size is normal. There is normal pulmonary artery systolic pressure. The estimated right ventricular systolic pressure is 25.0 mmHg. 3. Left atrial size was severely dilated. 4. Right atrial size was mildly dilated. 5. There is mild calcification of the aortic valve. Aortic valve regurgitation is not visualized. No aortic stenosis is present. 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 6. The mitral valve is normal in structure. Trivial mitral valve regurgitation. No evidence of mitral stenosis. 7. The inferior vena cava is normal in size with <50% respiratory variability, suggesting right atrial pressure of 8 mmHg. 8. Left pleural effusion noted. A small pericardial effusion is present. 9. The patient was in atrial fibrillation.  FINDINGS Left Ventricle: Left ventricular ejection fraction, by estimation, is 30 to 35%. Left ventricular ejection fraction by 2D MOD biplane is 33.5 %. The left ventricle has moderately decreased function. The left ventricle demonstrates global hypokinesis. The average left ventricular global longitudinal strain is -8.6 %. Strain was performed and the global longitudinal strain is abnormal. The left ventricular internal cavity size was normal in size. There is mild concentric left ventricular hypertrophy. Left ventricular diastolic parameters are indeterminate.  Right Ventricle: The right ventricular size is normal.  Right ventricular systolic function is mildly reduced. There is normal pulmonary artery systolic pressure. The tricuspid regurgitant velocity is 2.06 m/s, and with an assumed right atrial pressure of 8 mmHg, the estimated right ventricular systolic pressure is 25.0 mmHg.  Left Atrium: Left atrial size was severely dilated.  Right Atrium: Right atrial size was mildly dilated.  Pericardium: Left pleural effusion noted. A small pericardial effusion is present.  Mitral Valve: The mitral valve is normal in structure. Trivial mitral valve regurgitation. No evidence of mitral valve stenosis.  Tricuspid Valve: The tricuspid valve is normal in structure. Tricuspid valve regurgitation is mild.  Aortic Valve: There is mild calcification of the aortic valve. Aortic  valve regurgitation is not visualized. No aortic stenosis is present.  Venous: The inferior vena cava is normal in size with less than 50% respiratory variability, suggesting right atrial pressure of 8 mmHg.  IAS/Shunts: No atrial level shunt detected by color flow Doppler.  LEFT VENTRICLE PLAX 2D                        Biplane EF (MOD) LVIDd:         3.90 cm         LV Biplane EF:   Left LVIDs:         3.40 cm                          ventricular LV PW:         1.10 cm                          ejection LV IVS:        1.20 cm                          fraction by 2D MOD biplane is LV Volumes (MOD)                                33.5 %. LV vol d, MOD    80.7 ml A2C: LV vol d, MOD    76.4 ml       2D Longitudinal A4C:                           Strain LV vol s, MOD    50.8 ml       2D Strain GLS   -8.6 % A2C:                           Avg: LV vol s, MOD    53.6 ml A4C: LV SV MOD A2C:   29.9 ml LV SV MOD A4C:   76.4 ml LV SV MOD BP:    27.0 ml  RIGHT VENTRICLE          IVC RV Basal diam:  3.80 cm  IVC diam: 1.90 cm RV Mid diam:    2.80 cm  LEFT ATRIUM              Index        RIGHT ATRIUM           Index LA diam:        3.80  cm  2.34 cm/m   RA Area:     18.30 cm LA Vol (A2C):   109.0 ml 67.17 ml/m  RA Volume:   47.40 ml  29.21 ml/m LA Vol (A4C):   103.0 ml 63.47 ml/m LA Biplane Vol: 106.0 ml 65.32 ml/m TRICUSPID VALVE TR Peak grad:   17.0 mmHg TR Vmax:        206.00 cm/s  Dalton McleanMD Electronically signed by Ezra Kanner Signature Date/Time: 02/29/2024/8:17:39 AM    Final (Updated)   TEE  ECHO TEE 01/22/2022  Narrative TRANSESOPHOGEAL ECHO REPORT    Patient Name:   LENDA BARATTA Date of Exam: 01/22/2022 Medical Rec #:  [id number]        Height:  61.0 in Accession #:    [id number]       Weight:       110.9 lb Date of Birth:  June 25, 1951        BSA:          1.470 m Patient Age:    71 years         BP:           94/83 mmHg Patient Gender: F                HR:           108 bpm. Exam Location:  Inpatient  Procedure: 3D Echo, Transesophageal Echo, Cardiac Doppler and Color Doppler  Indications:     R94.31 Abnormal EKG; I48.91* Unspeicified atrial fibrillation  History:         Patient has prior history of Echocardiogram examinations, most recent 01/19/2022. Abnormal ECG, Arrythmias:Atrial Fibrillation and LBBB, Signs/Symptoms:Altered Mental Status, Chest Pain and Dyspnea; Risk Factors:Current Smoker.  Sonographer:     Ellouise Mose RDCS Referring Phys:  [id number] POWELL BRAVO [place]ERTON Diagnosing Phys: POWELL Sorrow MD  PROCEDURE: After discussion of the risks and benefits of a TEE, an informed consent was obtained from the patient. The transesophogeal probe was passed without difficulty through the esophogus of the patient. Imaged were obtained with the patient in a left lateral decubitus position. Sedation performed by different physician. The patient was monitored while under deep sedation. Anesthestetic sedation was provided intravenously by Anesthesiology: 153mg  of Propofol . The patient's vital signs; including heart rate, blood pressure, and oxygen  saturation; remained stable  throughout the procedure. The patient developed no complications during the procedure. A successful direct current cardioversion was performed at 200 joules with 2 attempts.  IMPRESSIONS   1. Left ventricular ejection fraction, by estimation, is 30 to 35%. The left ventricle has moderately decreased function. 2. Right ventricular systolic function is mildly reduced. The right ventricular size is normal. 3. Left atrial size was moderately dilated. There was smoke in the left atrial appendage without evidence of thrombus. 4. Right atrial size was moderately dilated. 5. The mitral valve is abnormal. There is mild thickening and calcification of the mitral valve leaflets. There is mild prolapse of the A2 and P2 scallops of the mitral valve with resultant mild mitral regurgitation. 6. The aortic valve is tricuspid. There is mild calcification of the aortic valve. There is mild thickening of the aortic valve. Aortic valve regurgitation is not visualized. 7. There is grade IV plaque at the level of the aortic arch. Aortic dilatation noted. There is borderline dilatation of the ascending aorta, measuring 36 mm.  FINDINGS Left Ventricle: Left ventricular ejection fraction, by estimation, is 30 to 35%. The left ventricle has moderately decreased function. The left ventricular internal cavity size was normal in size.  Right Ventricle: The right ventricular size is normal. No increase in right ventricular wall thickness. Right ventricular systolic function is mildly reduced.  Left Atrium: Left atrial size was moderately dilated. No left atrial/left atrial appendage thrombus was detected.  Right Atrium: Right atrial size was moderately dilated.  Pericardium: There is no evidence of pericardial effusion.  Mitral Valve: The mitral valve is abnormal. There is mild prolapse of A2 and P2 scallops of the mitral valve. There is mild thickening of the mitral valve leaflet(s). There is mild calcification of the  mitral valve leaflet(s). Mild mitral valve regurgitation.  Tricuspid Valve: The tricuspid valve is normal in structure. Tricuspid valve regurgitation is trivial.  Aortic Valve: The aortic valve is tricuspid. There is mild calcification of the aortic valve. There is mild thickening of the aortic valve. Aortic valve regurgitation is not visualized.  Pulmonic Valve: The pulmonic valve was normal in structure. Pulmonic valve regurgitation is mild to moderate.  Aorta: There is grade IV plaque at the level of the aortic arch. Aortic dilatation noted. There is borderline dilatation of the ascending aorta, measuring 36 mm.  IAS/Shunts: The atrial septum is grossly normal.    AORTA Ao Asc diam: 3.60 cm  MR Peak grad:    86.1 mmHg MR Mean grad:    45.0 mmHg MR Vmax:         464.00 cm/s MR Vmean:        308.0 cm/s MR PISA:         1.57 cm MR PISA Eff ROA: 13 mm MR PISA Radius:  0.50 cm  Powell Sorrow MD Electronically signed by Powell Sorrow MD Signature Date/Time: 01/22/2022/4:11:39 PM    Final        ______________________________________________________________________________________________      Risk Assessment/Calculations  CHA2DS2-VASc Score = 4   This indicates a 4.8% annual risk of stroke. The patient's score is based upon: CHF History: 1 HTN History: 0 Diabetes History: 0 Stroke History: 0 Vascular Disease History: 1 Age Score: 1 Gender Score: 1           Physical Exam VS:  BP 112/75 (BP Location: Right Arm, Patient Position: Sitting, Cuff Size: Normal)   Pulse (!) 101   Ht 5' 2 (1.575 m)   Wt 122 lb (55.3 kg)   SpO2 98%   BMI 22.31 kg/m        Wt Readings from Last 3 Encounters:  04/04/24 122 lb (55.3 kg)  04/01/24 126 lb (57.2 kg)  03/21/24 126 lb (57.2 kg)    GEN: Well nourished, well developed in no acute distress NECK: No JVD; No carotid bruits CARDIAC: Irregularly irregular, no murmurs, rubs, gallops RESPIRATORY:  Clear to  auscultation without rales, wheezing or rhonchi  ABDOMEN: Soft, non-tender, non-distended EXTREMITIES:  No edema; No deformity   ASSESSMENT AND PLAN  Permanent atrial fibrillation: Continue amiodarone , Eliquis  and metoprolol  succinate 50 mg twice a day.  Unable to further uptitrate rate control medication due to borderline low blood pressure.  HFrEF: She was discharged on 40 mg daily of Lasix .  Her weight has been stable.  She has 1-2+ pitting edema in the lower extremity, however this is likely related to her significant hypoalbuminemia.  Family is aware to give her additional dose of Lasix  as needed for weight gain and increased leg edema  Hyponatremia: Obtain basic metabolic panel today  Note at the recommendation of [facility] home health nurse, family has agreed to initiate home hospice which I do agree given her poor long-term prognosis and frailty.        Dispo: Follow-up in 4 weeks  Signed, Loni Delbridge, PA

## 2024-04-04 NOTE — Patient Instructions (Signed)
 Thank you for choosing [facility]!     Medication Instructions:  No medication changes were made during today's visit.  *If you need a refill on your cardiac medications before your next appointment, please call your pharmacy*   Lab Work: Labs will be drawn today.............. BMET If you have labs (blood work) drawn today and your tests are completely normal, you will receive your results only by: MyChart Message (if you have MyChart) OR A paper copy in the mail If you have any lab test that is abnormal or we need to change your treatment, we will call you to review the results.   Testing/Procedures: No procedures were ordered during today's visit.   Your next appointment:   4 month(s)   Provider:   Scot Ford, PA-C           Follow-Up: At [facility], you and your health needs are our priority.  As part of our continuing mission to provide you with exceptional heart care, we have created designated Provider Care Teams.  These Care Teams include your primary Cardiologist (physician) and Advanced Practice Providers (APPs -  Physician Assistants and Nurse Practitioners) who all work together to provide you with the care you need, when you need it. We recommend signing up for the patient portal called MyChart.  Sign up information is provided on this After Visit Summary.  MyChart is used to connect with patients for Virtual Visits (Telemedicine).  Patients are able to view lab/test results, encounter notes, upcoming appointments, etc.  Non-urgent messages can be sent to your provider as well.   To learn more about what you can do with MyChart, go to [email or web address].

## 2024-04-05 ENCOUNTER — Ambulatory Visit: Payer: Self-pay | Admitting: Physician Assistant

## 2024-04-05 LAB — BASIC METABOLIC PANEL WITH GFR
BUN/Creatinine Ratio: 19 (ref 12–28)
BUN: 26 mg/dL (ref 8–27)
CO2: 30 mmol/L — ABNORMAL HIGH (ref 20–29)
Calcium: 9.8 mg/dL (ref 8.7–10.3)
Chloride: 94 mmol/L — ABNORMAL LOW (ref 96–106)
Creatinine, Ser: 1.34 mg/dL — ABNORMAL HIGH (ref 0.57–1.00)
Glucose: 86 mg/dL (ref 70–99)
Potassium: 5.2 mmol/L (ref 3.5–5.2)
Sodium: 141 mmol/L (ref 134–144)
eGFR: 42 mL/min/1.73 — ABNORMAL LOW (ref >59)

## 2024-04-05 NOTE — Progress Notes (Signed)
 Stable renal function and electrolyte. Continue on current dose of diuretic, may take a extra lasix  in the afternoon AS NEEDED for weight gain of more than 3 lbs overnight or 5 lbs in a single week.

## 2024-04-13 DEATH — deceased

## 2024-06-07 ENCOUNTER — Encounter: Payer: Self-pay | Admitting: Radiation Oncology

## 2024-06-14 ENCOUNTER — Ambulatory Visit: Admitting: Endocrinology
